# Patient Record
Sex: Female | Born: 1955 | Race: White | Hispanic: No | Marital: Married | Smoking: Never smoker
Health system: Southern US, Community
[De-identification: ages and names within clinical notes are randomized; demographics above are authoritative.]

## PROBLEM LIST (undated history)

## (undated) DIAGNOSIS — I10 Essential (primary) hypertension: Secondary | ICD-10-CM

## (undated) DIAGNOSIS — C569 Malignant neoplasm of unspecified ovary: Secondary | ICD-10-CM

## (undated) DIAGNOSIS — E876 Hypokalemia: Secondary | ICD-10-CM

## (undated) DIAGNOSIS — N8110 Cystocele, unspecified: Secondary | ICD-10-CM

## (undated) DIAGNOSIS — I509 Heart failure, unspecified: Secondary | ICD-10-CM

## (undated) DIAGNOSIS — E785 Hyperlipidemia, unspecified: Secondary | ICD-10-CM

## (undated) DIAGNOSIS — I639 Cerebral infarction, unspecified: Secondary | ICD-10-CM

## (undated) DIAGNOSIS — I059 Rheumatic mitral valve disease, unspecified: Secondary | ICD-10-CM

## (undated) DIAGNOSIS — K219 Gastro-esophageal reflux disease without esophagitis: Secondary | ICD-10-CM

## (undated) DIAGNOSIS — G4730 Sleep apnea, unspecified: Secondary | ICD-10-CM

## (undated) DIAGNOSIS — I2510 Atherosclerotic heart disease of native coronary artery without angina pectoris: Secondary | ICD-10-CM

## (undated) HISTORY — PX: CARPAL TUNNEL RELEASE: SHX101

## (undated) HISTORY — PX: CHOLECYSTECTOMY: SHX55

## (undated) HISTORY — PX: TONSILLECTOMY: SUR1361

## (undated) HISTORY — DX: Heart failure, unspecified: I50.9

## (undated) HISTORY — DX: Hypokalemia: E87.6

## (undated) HISTORY — PX: TENNIS ELBOW RELEASE/NIRSCHEL PROCEDURE: SHX6651

## (undated) HISTORY — PX: COLON SURGERY: SHX602

---

## 2004-05-19 ENCOUNTER — Emergency Department: Payer: Self-pay | Admitting: Emergency Medicine

## 2004-07-29 ENCOUNTER — Emergency Department: Payer: Self-pay | Admitting: Emergency Medicine

## 2004-10-20 ENCOUNTER — Emergency Department: Payer: Self-pay | Admitting: General Practice

## 2005-01-29 ENCOUNTER — Emergency Department: Payer: Self-pay | Admitting: Emergency Medicine

## 2005-02-26 ENCOUNTER — Emergency Department: Payer: Self-pay | Admitting: Emergency Medicine

## 2005-04-05 ENCOUNTER — Emergency Department: Payer: Self-pay | Admitting: Emergency Medicine

## 2005-08-04 ENCOUNTER — Emergency Department: Payer: Self-pay | Admitting: Emergency Medicine

## 2005-12-06 ENCOUNTER — Emergency Department: Payer: Self-pay | Admitting: Emergency Medicine

## 2005-12-23 ENCOUNTER — Emergency Department: Payer: Self-pay | Admitting: Unknown Physician Specialty

## 2006-06-13 ENCOUNTER — Emergency Department: Payer: Self-pay | Admitting: Emergency Medicine

## 2006-12-13 ENCOUNTER — Emergency Department: Payer: Self-pay | Admitting: Emergency Medicine

## 2007-01-02 ENCOUNTER — Emergency Department: Payer: Self-pay | Admitting: Unknown Physician Specialty

## 2007-03-06 ENCOUNTER — Ambulatory Visit: Payer: Self-pay

## 2007-06-02 ENCOUNTER — Emergency Department: Payer: Self-pay | Admitting: Emergency Medicine

## 2007-08-29 ENCOUNTER — Other Ambulatory Visit: Payer: Self-pay

## 2007-08-30 ENCOUNTER — Inpatient Hospital Stay: Payer: Self-pay | Admitting: Internal Medicine

## 2008-01-20 ENCOUNTER — Emergency Department: Payer: Self-pay | Admitting: Emergency Medicine

## 2008-02-04 ENCOUNTER — Emergency Department: Payer: Self-pay | Admitting: Emergency Medicine

## 2008-02-29 ENCOUNTER — Emergency Department: Payer: Self-pay | Admitting: Emergency Medicine

## 2008-06-16 ENCOUNTER — Emergency Department: Payer: Self-pay | Admitting: Emergency Medicine

## 2008-10-05 ENCOUNTER — Emergency Department: Payer: Self-pay | Admitting: Unknown Physician Specialty

## 2009-03-18 ENCOUNTER — Emergency Department: Payer: Self-pay | Admitting: Emergency Medicine

## 2009-04-02 ENCOUNTER — Emergency Department: Payer: Self-pay | Admitting: Emergency Medicine

## 2009-06-23 ENCOUNTER — Emergency Department: Payer: Self-pay | Admitting: Emergency Medicine

## 2009-08-02 ENCOUNTER — Emergency Department: Payer: Self-pay | Admitting: Emergency Medicine

## 2010-01-12 ENCOUNTER — Emergency Department: Payer: Self-pay | Admitting: Emergency Medicine

## 2010-02-03 ENCOUNTER — Emergency Department: Payer: Self-pay | Admitting: Internal Medicine

## 2010-03-10 ENCOUNTER — Emergency Department: Payer: Self-pay | Admitting: Emergency Medicine

## 2010-04-14 ENCOUNTER — Emergency Department: Payer: Self-pay | Admitting: Emergency Medicine

## 2010-06-27 ENCOUNTER — Emergency Department: Payer: Self-pay | Admitting: Emergency Medicine

## 2010-09-03 ENCOUNTER — Emergency Department: Payer: Self-pay | Admitting: Emergency Medicine

## 2011-06-17 ENCOUNTER — Emergency Department: Payer: Self-pay | Admitting: Internal Medicine

## 2011-06-24 ENCOUNTER — Emergency Department: Payer: Self-pay | Admitting: Internal Medicine

## 2011-07-16 ENCOUNTER — Emergency Department: Payer: Self-pay | Admitting: Emergency Medicine

## 2011-09-05 ENCOUNTER — Emergency Department: Payer: Self-pay | Admitting: Emergency Medicine

## 2011-09-05 LAB — COMPREHENSIVE METABOLIC PANEL
Anion Gap: 8 (ref 7–16)
Bilirubin,Total: 0.4 mg/dL (ref 0.2–1.0)
Chloride: 108 mmol/L — ABNORMAL HIGH (ref 98–107)
Co2: 27 mmol/L (ref 21–32)
EGFR (African American): >60
EGFR (Non-African Amer.): >60
Glucose: 89 mg/dL (ref 65–99)
Osmolality: 288 (ref 275–301)
Potassium: 3.9 mmol/L (ref 3.5–5.1)
SGPT (ALT): 24 U/L
Sodium: 143 mmol/L (ref 136–145)
Total Protein: 7.3 g/dL (ref 6.4–8.2)

## 2011-09-05 LAB — URINALYSIS, COMPLETE
Bilirubin,UR: NEGATIVE
Glucose,UR: NEGATIVE mg/dL (ref 0–75)
Ketone: NEGATIVE
Nitrite: NEGATIVE
Protein: NEGATIVE
RBC,UR: 19 /HPF (ref 0–5)
Specific Gravity: 1.016 (ref 1.003–1.030)
Squamous Epithelial: 4
WBC UR: 3 /HPF (ref 0–5)

## 2011-09-05 LAB — CBC
HCT: 34.4 % — ABNORMAL LOW (ref 35.0–47.0)
HGB: 11.7 g/dL — ABNORMAL LOW (ref 12.0–16.0)
MCH: 31.6 pg (ref 26.0–34.0)
MCHC: 33.9 g/dL (ref 32.0–36.0)
MCV: 93 fL (ref 80–100)
Platelet: 177 10*3/uL (ref 150–440)
RBC: 3.7 10*6/uL — ABNORMAL LOW (ref 3.80–5.20)
RDW: 14.2 % (ref 11.5–14.5)

## 2011-09-05 LAB — CK TOTAL AND CKMB (NOT AT ARMC)
CK, Total: 174 U/L (ref 21–215)
CK-MB: 1.9 ng/mL (ref 0.5–3.6)

## 2011-09-05 LAB — TROPONIN I: Troponin-I: <0.02 ng/mL

## 2011-09-09 ENCOUNTER — Observation Stay: Payer: Self-pay | Admitting: Internal Medicine

## 2011-09-09 LAB — CK TOTAL AND CKMB (NOT AT ARMC): CK-MB: 1.3 ng/mL (ref 0.5–3.6)

## 2011-09-09 LAB — BASIC METABOLIC PANEL
Anion Gap: 11 (ref 7–16)
BUN: 17 mg/dL (ref 7–18)
Chloride: 105 mmol/L (ref 98–107)
Co2: 25 mmol/L (ref 21–32)
EGFR (African American): >60
EGFR (Non-African Amer.): >60
Glucose: 107 mg/dL — ABNORMAL HIGH (ref 65–99)
Sodium: 141 mmol/L (ref 136–145)

## 2011-09-09 LAB — URINALYSIS, COMPLETE
Ketone: NEGATIVE
Nitrite: NEGATIVE
Protein: NEGATIVE
Squamous Epithelial: 1

## 2011-09-09 LAB — CBC
HGB: 12.8 g/dL (ref 12.0–16.0)
MCH: 31.2 pg (ref 26.0–34.0)
MCV: 94 fL (ref 80–100)
Platelet: 174 10*3/uL (ref 150–440)
RBC: 4.1 10*6/uL (ref 3.80–5.20)

## 2011-09-09 LAB — TROPONIN I: Troponin-I: <0.02 ng/mL

## 2011-09-10 LAB — HEPATIC FUNCTION PANEL A (ARMC)
Albumin: 3.5 g/dL (ref 3.4–5.0)
Alkaline Phosphatase: 80 U/L (ref 50–136)
Bilirubin, Direct: 0.1 mg/dL (ref 0.00–0.20)
SGOT(AST): 19 U/L (ref 15–37)
Total Protein: 6.8 g/dL (ref 6.4–8.2)

## 2011-09-10 LAB — CBC WITH DIFFERENTIAL/PLATELET
Basophil #: 0 10*3/uL (ref 0.0–0.1)
Eosinophil #: 0.1 10*3/uL (ref 0.0–0.7)
HCT: 34.1 % — ABNORMAL LOW (ref 35.0–47.0)
Lymphocyte #: 2 10*3/uL (ref 1.0–3.6)
Lymphocyte %: 34.7 %
MCHC: 33.4 g/dL (ref 32.0–36.0)
Monocyte %: 10.4 %
Neutrophil #: 2.9 10*3/uL (ref 1.4–6.5)
Platelet: 169 10*3/uL (ref 150–440)
RBC: 3.64 10*6/uL — ABNORMAL LOW (ref 3.80–5.20)
RDW: 14.5 % (ref 11.5–14.5)
WBC: 5.6 10*3/uL (ref 3.6–11.0)

## 2011-09-10 LAB — LIPID PANEL
Cholesterol: 140 mg/dL (ref 0–200)
HDL Cholesterol: 48 mg/dL (ref 40–60)
Ldl Cholesterol, Calc: 58 mg/dL (ref 0–100)
Triglycerides: 172 mg/dL (ref 0–200)
VLDL Cholesterol, Calc: 34 mg/dL (ref 5–40)

## 2011-09-10 LAB — BASIC METABOLIC PANEL
Calcium, Total: 8.7 mg/dL (ref 8.5–10.1)
EGFR (African American): 58 — ABNORMAL LOW
EGFR (Non-African Amer.): 48 — ABNORMAL LOW
Glucose: 86 mg/dL (ref 65–99)
Osmolality: 285 (ref 275–301)

## 2011-09-11 LAB — URINE CULTURE

## 2011-09-12 ENCOUNTER — Emergency Department: Payer: Self-pay | Admitting: *Deleted

## 2011-09-18 ENCOUNTER — Emergency Department: Payer: Self-pay | Admitting: *Deleted

## 2011-09-18 LAB — COMPREHENSIVE METABOLIC PANEL
Albumin: 3.7 g/dL (ref 3.4–5.0)
Alkaline Phosphatase: 82 U/L (ref 50–136)
Anion Gap: 10 (ref 7–16)
Calcium, Total: 9.1 mg/dL (ref 8.5–10.1)
Chloride: 103 mmol/L (ref 98–107)
Co2: 28 mmol/L (ref 21–32)
EGFR (African American): >60
EGFR (Non-African Amer.): >60
Glucose: 100 mg/dL — ABNORMAL HIGH (ref 65–99)
Osmolality: 285 (ref 275–301)
Potassium: 3.5 mmol/L (ref 3.5–5.1)
SGOT(AST): 24 U/L (ref 15–37)
Sodium: 141 mmol/L (ref 136–145)

## 2011-09-18 LAB — TROPONIN I: Troponin-I: <0.02 ng/mL

## 2011-09-18 LAB — CBC
MCH: 31.6 pg (ref 26.0–34.0)
MCV: 93 fL (ref 80–100)
Platelet: 204 10*3/uL (ref 150–440)
RBC: 3.82 10*6/uL (ref 3.80–5.20)
WBC: 7.7 10*3/uL (ref 3.6–11.0)

## 2011-09-30 ENCOUNTER — Emergency Department: Payer: Self-pay | Admitting: Emergency Medicine

## 2012-01-16 ENCOUNTER — Emergency Department: Payer: Self-pay | Admitting: Unknown Physician Specialty

## 2012-01-22 ENCOUNTER — Emergency Department: Payer: Self-pay | Admitting: Emergency Medicine

## 2012-01-25 ENCOUNTER — Ambulatory Visit: Payer: Self-pay | Admitting: Specialist

## 2012-04-01 ENCOUNTER — Emergency Department: Payer: Self-pay | Admitting: Emergency Medicine

## 2012-05-04 ENCOUNTER — Emergency Department: Payer: Self-pay | Admitting: Emergency Medicine

## 2012-05-04 LAB — CBC WITH DIFFERENTIAL/PLATELET
Basophil %: 0.4 %
Eosinophil #: 0.1 10*3/uL (ref 0.0–0.7)
Eosinophil %: 1 %
HGB: 11.8 g/dL — ABNORMAL LOW (ref 12.0–16.0)
Lymphocyte %: 28.3 %
MCH: 32 pg (ref 26.0–34.0)
MCHC: 34.3 g/dL (ref 32.0–36.0)
MCV: 93 fL (ref 80–100)
Monocyte #: 0.6 x10 3/mm (ref 0.2–0.9)
Neutrophil %: 61.8 %
Platelet: 182 10*3/uL (ref 150–440)
RBC: 3.69 10*6/uL — ABNORMAL LOW (ref 3.80–5.20)

## 2012-05-04 LAB — COMPREHENSIVE METABOLIC PANEL
Albumin: 3.8 g/dL (ref 3.4–5.0)
Alkaline Phosphatase: 104 U/L (ref 50–136)
BUN: 16 mg/dL (ref 7–18)
Bilirubin,Total: 0.4 mg/dL (ref 0.2–1.0)
Co2: 27 mmol/L (ref 21–32)
Creatinine: 0.76 mg/dL (ref 0.60–1.30)
Glucose: 85 mg/dL (ref 65–99)
Osmolality: 284 (ref 275–301)
SGPT (ALT): 27 U/L (ref 12–78)
Total Protein: 7 g/dL (ref 6.4–8.2)

## 2012-05-04 LAB — SEDIMENTATION RATE: Erythrocyte Sed Rate: 20 mm/hr (ref 0–30)

## 2012-05-23 ENCOUNTER — Emergency Department: Payer: Self-pay | Admitting: Emergency Medicine

## 2012-06-27 ENCOUNTER — Emergency Department: Payer: Self-pay | Admitting: Emergency Medicine

## 2012-12-07 ENCOUNTER — Emergency Department: Payer: Self-pay | Admitting: Emergency Medicine

## 2013-01-06 ENCOUNTER — Emergency Department: Payer: Self-pay | Admitting: Emergency Medicine

## 2013-01-06 LAB — CBC
HGB: 12.9 g/dL (ref 12.0–16.0)
MCHC: 34.2 g/dL (ref 32.0–36.0)
MCV: 90 fL (ref 80–100)
RDW: 14.1 % (ref 11.5–14.5)

## 2013-01-06 LAB — BASIC METABOLIC PANEL
Anion Gap: 10 (ref 7–16)
BUN: 16 mg/dL (ref 7–18)
Co2: 25 mmol/L (ref 21–32)
EGFR (African American): >60
Osmolality: 282 (ref 275–301)
Potassium: 3.6 mmol/L (ref 3.5–5.1)
Sodium: 141 mmol/L (ref 136–145)

## 2013-01-06 LAB — TROPONIN I: Troponin-I: <0.02 ng/mL

## 2013-03-26 ENCOUNTER — Emergency Department: Payer: Self-pay | Admitting: Emergency Medicine

## 2013-03-26 LAB — CBC
HCT: 37.6 % (ref 35.0–47.0)
HGB: 12.8 g/dL (ref 12.0–16.0)
MCH: 31 pg (ref 26.0–34.0)
MCHC: 33.9 g/dL (ref 32.0–36.0)
Platelet: 201 10*3/uL (ref 150–440)
RBC: 4.12 10*6/uL (ref 3.80–5.20)

## 2013-03-26 LAB — BASIC METABOLIC PANEL
Anion Gap: 6 — ABNORMAL LOW (ref 7–16)
BUN: 24 mg/dL — ABNORMAL HIGH (ref 7–18)
Calcium, Total: 9.7 mg/dL (ref 8.5–10.1)
Co2: 27 mmol/L (ref 21–32)
Creatinine: 0.91 mg/dL (ref 0.60–1.30)
EGFR (African American): >60
EGFR (Non-African Amer.): >60
Glucose: 94 mg/dL (ref 65–99)

## 2013-03-26 LAB — CK TOTAL AND CKMB (NOT AT ARMC)
CK, Total: 113 U/L (ref 21–215)
CK, Total: 103 U/L (ref 21–215)
CK-MB: 1 ng/mL (ref 0.5–3.6)

## 2013-03-26 LAB — TROPONIN I: Troponin-I: <0.02 ng/mL

## 2013-07-03 ENCOUNTER — Emergency Department: Payer: Self-pay

## 2013-07-06 LAB — BETA STREP CULTURE(ARMC)

## 2014-05-13 ENCOUNTER — Emergency Department: Payer: Self-pay | Admitting: Emergency Medicine

## 2014-06-01 ENCOUNTER — Emergency Department: Payer: Self-pay | Admitting: Emergency Medicine

## 2014-06-02 ENCOUNTER — Emergency Department: Payer: Self-pay | Admitting: Emergency Medicine

## 2014-06-02 LAB — CBC WITH DIFFERENTIAL/PLATELET
BASOS ABS: 0 10*3/uL (ref 0.0–0.1)
BASOS PCT: 0.5 %
Eosinophil #: 0.1 10*3/uL (ref 0.0–0.7)
Eosinophil %: 1.4 %
HCT: 36.9 % (ref 35.0–47.0)
HGB: 12.6 g/dL (ref 12.0–16.0)
LYMPHS ABS: 1.7 10*3/uL (ref 1.0–3.6)
Lymphocyte %: 23.1 %
MCH: 31.8 pg (ref 26.0–34.0)
MCHC: 34.1 g/dL (ref 32.0–36.0)
MCV: 93 fL (ref 80–100)
MONO ABS: 0.7 x10 3/mm (ref 0.2–0.9)
Monocyte %: 9 %
Neutrophil #: 4.9 10*3/uL (ref 1.4–6.5)
Neutrophil %: 66 %
PLATELETS: 214 10*3/uL (ref 150–440)
RBC: 3.95 10*6/uL (ref 3.80–5.20)
RDW: 13.9 % (ref 11.5–14.5)
WBC: 7.5 10*3/uL (ref 3.6–11.0)

## 2014-06-02 LAB — BASIC METABOLIC PANEL
Anion Gap: 7 (ref 7–16)
BUN: 18 mg/dL (ref 7–18)
CALCIUM: 8.5 mg/dL (ref 8.5–10.1)
Chloride: 105 mmol/L (ref 98–107)
Co2: 28 mmol/L (ref 21–32)
Creatinine: 0.98 mg/dL (ref 0.60–1.30)
Glucose: 115 mg/dL — ABNORMAL HIGH (ref 65–99)
Osmolality: 282 (ref 275–301)
Potassium: 3.2 mmol/L — ABNORMAL LOW (ref 3.5–5.1)
Sodium: 140 mmol/L (ref 136–145)

## 2014-06-02 LAB — TROPONIN I: Troponin-I: <0.02 ng/mL

## 2014-06-07 ENCOUNTER — Emergency Department: Payer: Self-pay | Admitting: Internal Medicine

## 2014-06-07 LAB — COMPREHENSIVE METABOLIC PANEL
ALT: 28 U/L
ANION GAP: 6 — AB (ref 7–16)
Albumin: 3.7 g/dL (ref 3.4–5.0)
Alkaline Phosphatase: 137 U/L — ABNORMAL HIGH
BILIRUBIN TOTAL: 0.5 mg/dL (ref 0.2–1.0)
BUN: 19 mg/dL — AB (ref 7–18)
CO2: 31 mmol/L (ref 21–32)
Calcium, Total: 9 mg/dL (ref 8.5–10.1)
Chloride: 100 mmol/L (ref 98–107)
Creatinine: 0.92 mg/dL (ref 0.60–1.30)
EGFR (African American): >60
EGFR (Non-African Amer.): >60
GLUCOSE: 109 mg/dL — AB (ref 65–99)
Osmolality: 277 (ref 275–301)
POTASSIUM: 3.4 mmol/L — AB (ref 3.5–5.1)
SGOT(AST): 15 U/L (ref 15–37)
Sodium: 137 mmol/L (ref 136–145)
Total Protein: 7.5 g/dL (ref 6.4–8.2)

## 2014-06-07 LAB — CBC WITH DIFFERENTIAL/PLATELET
Basophil #: 0 10*3/uL (ref 0.0–0.1)
Basophil %: 0.5 %
EOS PCT: 1.3 %
Eosinophil #: 0.1 10*3/uL (ref 0.0–0.7)
HCT: 38 % (ref 35.0–47.0)
HGB: 12.6 g/dL (ref 12.0–16.0)
LYMPHS ABS: 1.8 10*3/uL (ref 1.0–3.6)
LYMPHS PCT: 23.4 %
MCH: 31 pg (ref 26.0–34.0)
MCHC: 33.2 g/dL (ref 32.0–36.0)
MCV: 93 fL (ref 80–100)
MONOS PCT: 9.4 %
Monocyte #: 0.7 x10 3/mm (ref 0.2–0.9)
Neutrophil #: 4.9 10*3/uL (ref 1.4–6.5)
Neutrophil %: 65.4 %
Platelet: 215 10*3/uL (ref 150–440)
RBC: 4.07 10*6/uL (ref 3.80–5.20)
RDW: 14.1 % (ref 11.5–14.5)
WBC: 7.5 10*3/uL (ref 3.6–11.0)

## 2014-06-07 LAB — CK TOTAL AND CKMB (NOT AT ARMC)
CK, Total: 93 U/L (ref 26–192)
CK-MB: 0.7 ng/mL (ref 0.5–3.6)

## 2014-06-07 LAB — URINALYSIS, COMPLETE
Bilirubin,UR: NEGATIVE
Glucose,UR: NEGATIVE mg/dL (ref 0–75)
KETONE: NEGATIVE
Nitrite: NEGATIVE
Ph: 5 (ref 4.5–8.0)
Specific Gravity: 1.017 (ref 1.003–1.030)
Squamous Epithelial: 15

## 2014-06-07 LAB — MAGNESIUM: Magnesium: 2.2 mg/dL

## 2014-06-07 LAB — TSH: Thyroid Stimulating Horm: 0.702 u[IU]/mL

## 2014-06-07 LAB — TROPONIN I

## 2014-07-19 ENCOUNTER — Emergency Department: Payer: Self-pay | Admitting: Student

## 2014-11-09 NOTE — Consult Note (Signed)
Brief Consult Note: Diagnosis: Known history of GERD.  Presentation to ER with mid chest pain with the susp. of pain being related to uncontrolled GERD or esophageal spasm.  Seen 09/05/2011 in ER and diagnosed with possible TIA discharged home.  MR of Brain reveals acute findings.   Orders entered.   Discussed with Attending MD.   Comments: Patient's presentation discussed with Dr. Verdie Shire.  Recommendation is to monitor patient's symptoms at this time due to the fact she has just experienced acute vascular event.  Do recommend PPI therapy bid and for her to be discharged on Nexium 40 mg one capsule bid.  She should avoid NSAIDs. She will require office follow up with our group to discuss proceeding when clinically feasible with EGD and colonoscopy for screening.  Discussed with patient. Will continue to monitor.  Electronic Signatures: Payton Emerald (NP)  (Signed [phone] 15:35)  Authored: Brief Consult Note   Last Updated: 22-Feb-13 15:35 by Payton Emerald (NP)

## 2014-11-09 NOTE — Discharge Summary (Signed)
PATIENT NAME:  Destiny [name], STEPIEN MR#:  [id number] DATE OF BIRTH:  [birth year]  DATE OF ADMISSION:  09/09/2011 DATE OF DISCHARGE:  09/10/2011  ADMITTING PHYSICIAN: Loletha Grayer, MD  DISCHARGING PHYSICIAN: Gladstone Lighter, MD  PRIMARY CARE PHYSICIAN: Destiny Lank, MD  CONSULTANTS: Verdie Shire, MD - Gastroenterology.  DISCHARGE DIAGNOSES:  1. Chest pain secondary to gastroesophageal reflux disease.  2. Cerebrovascular accident with acute left parietal infarct and small punctate left cerebellar infarct with no residual neurological deficits.  3. Hypertension.  4. Hyperlipidemia.  5. Sleep apnea.   DISCHARGE HOME MEDICATIONS:  1. Vitamin D3 2000 international units 1 tablet p.o. daily.  2. Lovastatin 40 mg p.o. daily.  3. Gabapentin 600 mg at bedtime.  4. Nexium 40 mg p.o. twice a day. 5. Plavix 70 mg p.o. daily.   NOTE: The patient was advised to hold her Lasix and potassium chloride for the next three days.   DISCHARGE DIET: Low-sodium diet.   DISCHARGE ACTIVITY: As tolerated.   FOLLOWUP INSTRUCTIONS:  1. Follow-up with PCP in one week.  2. Gastroenterology followup in 3 to 4 weeks.  DISCHARGE LABS/STUDIES: WBC 5.6, hemoglobin 11.4, hematocrit 34.1, platelet count 169.   Sodium 142, potassium 4.0, chloride 105, bicarbonate 28, BUN 20, creatinine 1.24, glucose 86, calcium 8.7.   LDL 58, HDL 48, triglycerides 172, total cholesterol 140. ALT 22, AST 19, alkaline phosphatase 80, total bilirubin 0.4, albumin 3.5. Troponins have remained negative.   Urinalysis and urine cultures have remained negative while in the hospital.   Ultrasound carotid Doppler's bilaterally are showing no hemodynamically significant stenosis on both sides.  A MRI of the brain does show a small acute infarct in the left parietal lobe and possible punctate infarct in the left cerebellum, which is acute.  Echo Doppler is showing left atrial enlargement, borderline, ejection fraction greater than 55%, and  mild tricuspid regurgitation.  BRIEF HOSPITAL COURSE: Destiny [name] is a 59 year old female with past medical history significant for gastroesophageal reflux disease, hypertension, and hyperlipidemia who presented to the hospital complaining of sudden onset of chest pain and heartburn that woke her up from sleep.  1. Chest pain, likely secondary to worsening gastroesophageal reflux disease. Gastroenterology was contacted. The patient was placed on Protonix IV and questionable plan for endoscopy at the time of admission. Since her symptoms have improved on IV Protonix and she has not had any nausea, vomiting, hematemesis, or melena, and no evidence of any active GI bleed, Gastroenterology recommended changing her Nexium from once a day to twice a day and will follow up as an outpatient for endoscopy and colonoscopy as an outpatient. Her symptoms have improved and she has tolerated a regular diet.  2. Acute cerebrovascular accident. The patient had symptoms of slurred speech five days prior to this admission and came to the emergency room. She had a CT of the head which was negative and was discharged. Currently she does not have any residual neurological deficits, but because of her presentation a MRI was ordered and it did show an acute infarct in the left parietal area and a punctate infarct in the left cerebellar region. She has worked with physical therapy and has not shown any deficits. She was not taking any aspirin at home. She got some headache with Aggrenox so we will change her to Plavix at the time of discharge and can follow-up with her PCP. Her Doppler's and echocardiogram are normal at this time.  3. Hypertension. Blood pressure is stable.  4. Hyperlipidemia with  LDL of only 58. She is on lovastatin.  5. She was on Lasix prior to admission. Her ejection fraction is 55%. She has minor pedal edema, but since her creatinine has slightly increased, although with normal range along with BUN, she was  advised to hold off on taking her Lasix for the next three days and restart it and see her PCP in the next week.   Her course has been otherwise uneventful in the hospital.   DISCHARGE CONDITION: Stable.   DISCHARGE DISPOSITION: Home.   TIME SPENT ON DISCHARGE: 45 minutes. ____________________________ Gladstone Lighter, MD rk:slb D: 09/10/2011 13:07:01 ET T: 09/11/2011 13:53:43 ET JOB#: [id number]  cc: Gladstone Lighter, MD, <Dictator> Sarah "Sallie" Posey Pronto, MD Gladstone Lighter MD ELECTRONICALLY SIGNED 09/16/2011 10:43

## 2014-11-09 NOTE — Consult Note (Signed)
Chief Complaint:   Subjective/Chief Complaint See Dawn Harrison's notes from yest. No chest pain or burning this AM. Chronic hx of GERD on Nexium for years.   VITAL SIGNS/ANCILLARY NOTES: **Vital Signs.:   23-Feb-13 05:38   Temperature Temperature (F) 98.9   Celsius 37.1   Pulse Pulse 69   Respirations Respirations 16   Systolic BP Systolic BP 768   Diastolic BP (mmHg) Diastolic BP (mmHg) 71   Mean BP 83   Pulse Ox % Pulse Ox % 96   Oxygen Delivery Room Air/ 21 %   Brief Assessment:   Cardiac Regular    Respiratory normal resp effort  clear BS    Gastrointestinal Normal   Routine Hem:  23-Feb-13 06:17    WBC (CBC) 5.6   RBC (CBC) 3.64   Hemoglobin (CBC) 11.4   Hematocrit (CBC) 34.1   Platelet Count (CBC) 169   MCV 94   MCH 31.3   MCHC 33.4   RDW 14.5  Routine Chem:  23-Feb-13 06:17    Glucose, Serum 86   BUN 20   Creatinine (comp) 1.24   Sodium, Serum 142   Potassium, Serum 4.0   Chloride, Serum 105   CO2, Serum 28   Calcium (Total), Serum 8.7   Anion Gap 9   Osmolality (calc) 285   eGFR (African American) 58   eGFR (Non-African American) 48  Routine Hem:  23-Feb-13 06:17    Neutrophil % 52.3   Lymphocyte % 34.7   Monocyte % 10.4   Eosinophil % 2.4   Basophil % 0.2   Neutrophil # 2.9   Lymphocyte # 2.0   Monocyte # 0.6   Eosinophil # 0.1   Basophil # 0.0  Routine Chem:  23-Feb-13 06:17    Cholesterol, Serum 140   Triglycerides, Serum 172   HDL (INHOUSE) 48   VLDL Cholesterol Calculated 34   LDL Cholesterol Calculated 58  Hepatic:  23-Feb-13 06:17    Bilirubin, Total 0.4   Bilirubin, Direct 0.1   Alkaline Phosphatase 80   SGPT (ALT) 22   SGOT (AST) 19   Total Protein, Serum 6.8   Albumin, Serum 3.5   Assessment/Plan:  Assessment/Plan:   Assessment Prob GERD. CVA.    Plan Continue PPI BID QAC. Have patient f/u with [place] after discharge. Will arrange for EGD and colonoscopy later. Will sign off. Thanks.   Electronic Signatures: Verdie Shire (MD)  (Signed [phone] 08:30)  Authored: Chief Complaint, VITAL SIGNS/ANCILLARY NOTES, Brief Assessment, Lab Results, Assessment/Plan   Last Updated: 23-Feb-13 08:30 by Verdie Shire (MD)

## 2014-11-09 NOTE — Consult Note (Signed)
PATIENT NAME:  Destiny [name], Destiny [name] MR#:  [id number] DATE OF BIRTH:  [birth year]  DATE OF CONSULTATION:  09/09/2011  REFERRING PHYSICIAN:  Loletha Grayer, MD CONSULTING PHYSICIAN:  Payton Emerald, NP/Paul Oh, MD  REASON FOR CONSULTATION: Chest pain. Known history of gastroesophageal reflux disease.   HISTORY OF PRESENT ILLNESS:  Destiny [name] is a 59 year old Caucasian female who presented to [facility] emergency room for the concern of chest pain. She awoke from sleep at 1:00 a.m. today, burning sensation, very severe, according to patient described as a 9 on a scale of 1 to 10 in intensity. Discomfort lasted for 5 minutes in length. She did feel some pain radiating to her right arm. She was able to fall back to sleep and then awoke at 3 a.m. with the symptoms being similar, at that time her husband decided to bring her to the emergency room.   She thought possibly her symptoms were in correlation with acid reflux as she had eaten Manwich the night before. She has some nausea but no vomiting. No shortness of breath.  Did not become diaphoretic. She was given nitroglycerin, aspirin, as well as a gastrointestinal cocktail in the emergency room. They were not given at the same time but at separate times.  The gastrointestinal cocktail is what helped to relieve the chest discomfort. She had presented to the emergency room Monday of this week for slurred speech. She was unable to get her words together. She was told that she had a transient ischemic attack. CT of the head was negative, but she has undergone an MRI today, which was done without contrast, which did reveal a small acute infarct in the left parietal lobe, possible punctate acute infarct in the left cerebellum.   She had presented to her primary care provider on Wednesday. She was given Bactrim for a possible urinary tract infection. She did experience diarrhea this morning after the onset of these symptoms. Normally she experiences constipation. No melena.  No evidence of bright red blood. She had an upper endoscopy done numerous years ago. No history of ulcer disease. Denies ever having a colonoscopy performed in the past.   PAST MEDICAL HISTORY: Hyperlipidemia, transient ischemic attack 2009, hypertension, edema, sleep apnea, and gastroesophageal reflux disease.   PAST SURGICAL HISTORY: Cholecystectomy, cyst resection from rectum, tonsillectomy, carpal tunnel surgery bilaterally, and left tennis elbow.   ALLERGIES: None.    MEDICATIONS:   1. Bactrim recently prescribed unknown milligrams or directions.   2. Lasix 40 mg a day.   3. Nexium 40 mg daily, which she does take 30 minutes to an hour prior to breakfast.   4. K-Dur 10 mEq a day.   5. Gabapentin 600 mg 2-1/2 tablets at night.   6. Lovastatin 40 mg a day.   7. Aspirin 81 mg a day.   8. Vitamin D 4000 international units daily.   FAMILY HISTORY: Mother, possible heart disease. Cancer, unknown specific details. Father deceased from myocardial infarction.   SOCIAL HISTORY: No tobacco. No alcohol. No recreational drug use. Resides with her husband. Employed as a Quarry manager.   REVIEW OF SYSTEMS: CONSTITUTIONAL: Significant for chills. No fevers. No sweats.  Positive for weight loss from 245 down to 200 unintentionally.  Fatigued. ENT: Does wear glasses. HEENT: No difficulty swallowing. Significant for runny nose, dry throat. CARDIOVASCULAR: Significant for chest burning midsternal. No heart palpitations. RESPIRATORY: No cough, no shortness of breath. No, sputum production. No hemoptysis.  GI: See HPI. GU: No dysuria, hematuria. MUSCULOSKELETAL: Significant  for right arm weakness, pain, and common for cramping to her legs. INTEGUMENTARY: No rashes. No lesions. NEUROLOGICAL: Newly diagnosed with transient ischemic attack this admission, and history of transient ischemic attack in 2009. ENDOCRINE: No thyroid problems. HEMATOLOGIC/LYMPHATIC: No anemia. Denies significant easy bruising and bleeding.    PHYSICAL EXAMINATION:  VITAL SIGNS: Temperature 98.1, pulse of 67, respirations are 16, blood pressure 119/81, pulse oximetry is 97%.   GENERAL: Well-developed, well-nourished 59 year old Caucasian female, no acute distress noted. Pleasant.   HEENT: Normocephalic, atraumatic. Pupils equal, reactive to light. Conjunctivae clear. Sclerae anicteric.   NECK: Supple. Trachea midline. No lymphadenopathy or thyromegaly.   PULMONARY: Symmetric rise and fall of chest. Clear to auscultation throughout.   CARDIOVASCULAR: Regular rhythm, S1, S2. No murmurs, no gallops.   ABDOMEN: Soft, nondistended. Bowel sounds in 4 quadrants. No bruits. No masses.   RECTAL: Deferred.   MUSCULOSKELETAL: Moving all four extremities. No contractures. No clubbing. No weakness.   EXTREMITIES: No edema.   NEUROLOGICAL: No gross neurological deficits.   PSYCHIATRIC: Alert and oriented x4. Memory grossly intact. Appropriate affect and mood.    LABORATORY/DIAGNOSTIC: Reviewed. Chemistry panel from September 05, 2011, BUN was elevated at 22, chloride was elevated at 108, otherwise within normal limits. Hemoglobin was 11.7 with a hematocrit of 34.4, otherwise within normal limits. Urinalysis revealed +2 blood, +2 leukocytes, RBCs are 18 per high-power field, WBCs is 3 per high-power field, and epithelial cells are 4 per high-power field. Comparison today's date glucose is 107, otherwise within normal limits. Cardiac enzymes 1st in series CK is 141, CK-MB is 1.3, and troponin is less than 0.02. Troponin 2nd in series less than 0.02 and CBC within normal limits. CT of head without contrast was done on the 09/05/2011 which revealed chronic changes without evidence of acute abnormalities. Chest x-ray revealed no evidence of acute cardiopulmonary disease. Ultrasound Doppler lower right extremity unremarkable. MRI performed today results as stated under history. Chest x-ray no acute changes. An ultrasound carotid Doppler  bilaterally, no hemodynamically significant stenosis is identified on either side. There is intergrade flow to both vertebrals.   IMPRESSION:  Known history of gastroesophageal reflux disease. Presentation in ER with mid chest pain suspicion for pain being related to uncontrolled gastroesophageal reflux disease versus esophageal spasm. The patient was seen in the ER on 09/05/2011 and diagnosed with probable transient ischemic attack, discharged with aspirin. MRI of brain today reveals acute changes.   PLAN: The patient's presentation was discussed with Dr. Verdie Shire. Recommendation is to monitor the patient's symptoms at this time due to the fact that she just experienced acute vascular event.  Do recommend PPI therapy b.i.d. and for her to be discharged on Nexium 40 mg capsule twice a day. She should avoid NSAIDs. She will require office followup with our group to discuss proceeding when clinically feasible with an EGD and a colonoscopy for screening. Discussed with patient. We will continue to monitor.    These services provided under collaborative agreement with Dr. Verdie Shire.   Thank you for allowing [place] to participate in the care during her hospitalization.      ____________________________ Payton Emerald, NP dsh:vtd D: 09/09/2011 15:35:35 ET T: 09/10/2011 09:07:52 ET JOB#: [id number]  cc: Payton Emerald, NP, <Dictator> Payton Emerald MD ELECTRONICALLY SIGNED 09/14/2011 13:38

## 2014-11-09 NOTE — H&P (Signed)
PATIENT NAME:  Destiny [name], Destiny [name] MR#:  [id number] DATE OF BIRTH:  [birth year]  DATE OF ADMISSION:  09/09/2011  PRIMARY CARE PHYSICIAN: Dr. Posey Pronto at [facility]: Chest pain.   HISTORY OF PRESENT ILLNESS: This is a 59 year old female who presents to the ER with chest pain which woke her up from sleep at 1 a.m. She describes the chest pain as burning in nature, very severe, 9 out of 10 in intensity, then it lasted for about five minutes. She did feel some arm pain afterwards on the right arm. She went back to sleep and then again at 2:30 to 3 a.m. woke up again with this burning chest pain lasting again for another five minutes then it radiated to her back between her shoulder blades bilaterally. She thought it was acid reflux because she ate a Manwich last night for supper. She did have some nausea this morning but not with the episodes. No shortness of breath. No diaphoresis. Nothing made the pain better or worse. Right now the pain has settled down on its own. She did receive a GI cocktail and nitroglycerin in the Emergency Room and also an aspirin. Of note, she was here in the Emergency Room on Monday with slurred speech. She could not get her words together. They told her it was a TIA. She had a CT scan of the head that was negative at the time but, of note, she did have a positive urinalysis at that time. She went to her primary care physician on Wednesday and was given Bactrim for possible urinary tract infection and was supposed to be set up for an MRI, ultrasound, and echocardiogram. She did have diarrhea this morning also. Hospitalist services were contacted for admission.   PAST MEDICAL HISTORY:  1. Hyperlipidemia. 2. Transient ischemic attack. 3. Hypertension. 4. Edema. 5. Sleep apnea.  6. Gastroesophageal reflux disease.   PAST SURGICAL HISTORY:  1. Cholecystectomy.  2. Cyst resection.  3. Tonsillectomy.  4. Carpal tunnel bilaterally. 5. Tennis elbow on the  left.   ALLERGIES: No known drug allergies.   MEDICATIONS:  1. Recently prescribed Bactrim.  2. Lasix 40 mg daily.  3. Nexium 40 mg daily.  4. K-Dur 10 mEq daily.  5. Gabapentin 600 mg 2-1/2 tablets at night.  6. Lovastatin 40 mg. 7. Aspirin 81 mg.   SOCIAL HISTORY: No smoking. No alcohol. No drug use. Lives with her husband. Works as a Quarry manager.   FAMILY HISTORY: Mother died of possibly heart disease. Mother unknown medical history.   REVIEW OF SYSTEMS: CONSTITUTIONAL: Positive for chills. No fever. No sweats. Positive for weight loss from 245 down to 200, not trying. Positive for fatigue. EYES: She does wear glasses. EARS, NOSE, MOUTH, AND THROAT: No difficulty swallowing. Positive for runny nose, dry throat. CARDIOVASCULAR: Positive for chest burning. No palpitations. RESPIRATORY: No shortness of breath. No cough. No sputum. No hemoptysis. GASTROINTESTINAL: Positive for nausea. No vomiting. No abdominal pain. Positive for diarrhea this morning. No constipation. No bright red blood per rectum. No melena. GENITOURINARY: No burning on urination. No hematuria. MUSCULOSKELETAL: Positive for right arm pain. Positive for common cramps in the legs. INTEGUMENTARY: No rashes or eruptions. NEUROLOGIC: No fainting or blackouts. ENDOCRINE: No thyroid problems. HEMATOLOGIC/LYMPHATIC: No anemia. No easy bruising or bleeding.   PHYSICAL EXAMINATION:   VITAL SIGNS: Temperature 95.7, pulse 75, respirations 16, blood pressure 137/83, pulse oximetry 98% on room air.   GENERAL: No respiratory distress.   EYES: Conjunctivae and  lids normal. Pupils equal, round, and reactive to light. Extraocular muscles intact. No nystagmus.   EARS, NOSE, MOUTH, AND THROAT: Tympanic membrane no erythema. Nasal mucosa no erythema. Throat no erythema. No exudate seen. Lips and gums normal.   NECK: No JVD. No bruits. No lymphadenopathy. No thyromegaly. No thyroid nodules palpated.   RESPIRATORY: Lungs clear to auscultation. No  use of accessory muscles to breathe. No rhonchi, rales, or wheeze heard.   CARDIOVASCULAR: S1, S2 normal. No gallops, rubs, or murmurs heard. Carotid upstroke 2+ bilaterally. No bruits.   EXTREMITIES: Dorsalis pedis pulses 2+ bilaterally. No edema of the lower extremity.   ABDOMEN: Soft, nontender. No organomegaly/splenomegaly. Normoactive bowel sounds. No masses felt.   LYMPHATIC: No lymph nodes in the neck.   MUSCULOSKELETAL: No clubbing, edema, or cyanosis. Positive pain in the biceps groove on the right shoulder and at the biceps insertion in the right elbow.  SKIN: No rashes or lesions.   NEUROLOGIC: Cranial nerves II through XII grossly intact. Deep tendon reflexes 2+ bilateral lower extremities.   PSYCHIATRIC: The patient is oriented to person, place, and time.   LABORATORY, DIAGNOSTIC, AND RADIOLOGICAL DATA: White blood cell count 7.3, hemoglobin and hematocrit 12.8 and 38.3, platelet count 174, glucose 107, BUN 17, creatinine 0.88, sodium 141, potassium 3.9, chloride 105, CO2 25, calcium 9.3. Cardiac enzymes negative. Ultrasound of the right upper extremity last ER visit was no DVT right arm. Urinalysis on February 18th showed 2+ leukocyte esterase. Chest x-ray on the 18th showed no acute cardiopulmonary disease. CT scan of the head on the 18th showed chronic changes.   ASSESSMENT AND PLAN:  1. Chest pain with burning sensation. This sounds more GI related to me. Will admit as an observation. Get telemetry and serial cardiac enzymes. I did speak with Dr. Verdie Shire of Gastroenterology to consider endoscopy. I will hold aspirin at this time and give IV Protonix. Will give a diet today. If endoscopy is going to be done it will be tomorrow after MRI of the brain results back.  2. Possible recent TIA with slurred speech the other day requiring an ER visit. Will get an MRI of the brain to rule out stroke and a carotid ultrasound. I doubt this is a TIA. It may be related to the urinary tract  infection.  3. Recent urinary tract infection. Will send off a urinalysis and urine culture. Continue Bactrim at this point.  4. Right arm biceps pain, most likely tendinitis. Hold off on inflammatories at this point with burning in the chest.  5. Hyperlipidemia. Continue lovastatin.  6. Sleep apnea. She wears CPAP at night and she takes gabapentin for this, not quite sure what the reason behind that is.   TIME SPENT ON ADMISSION: 55 minutes. The patient will be admitted as an observation.   ____________________________ Tana Conch. Leslye Peer, MD rjw:drc D: 09/09/2011 09:40:24 ET T: 09/09/2011 10:01:15 ET JOB#: [id number]  cc: Tana Conch. Leslye Peer, MD, <Dictator> Sarah "Sallie" Posey Pronto, MD Marisue Brooklyn MD ELECTRONICALLY SIGNED 09/09/2011 16:05

## 2014-11-15 ENCOUNTER — Emergency Department: Admit: 2014-11-15 | Disposition: A | Discharge status: Home / Self Care | Payer: Self-pay | Admitting: Student

## 2014-11-27 ENCOUNTER — Emergency Department
Admission: EM | Admit: 2014-11-27 | Discharge: 2014-11-27 | Disposition: A | Discharge status: Home / Self Care | Payer: Self-pay | Attending: Emergency Medicine | Admitting: Emergency Medicine

## 2014-11-27 ENCOUNTER — Encounter: Payer: Self-pay | Admitting: Emergency Medicine

## 2014-11-27 DIAGNOSIS — Z7902 Long term (current) use of antithrombotics/antiplatelets: Secondary | ICD-10-CM | POA: Insufficient documentation

## 2014-11-27 DIAGNOSIS — Z79899 Other long term (current) drug therapy: Secondary | ICD-10-CM | POA: Insufficient documentation

## 2014-11-27 DIAGNOSIS — L259 Unspecified contact dermatitis, unspecified cause: Secondary | ICD-10-CM | POA: Insufficient documentation

## 2014-11-27 HISTORY — DX: Sleep apnea, unspecified: G47.30

## 2014-11-27 HISTORY — DX: Cerebral infarction, unspecified: I63.9

## 2014-11-27 HISTORY — DX: Gastro-esophageal reflux disease without esophagitis: K21.9

## 2014-11-27 MED ORDER — DEXAMETHASONE SODIUM PHOSPHATE 10 MG/ML IJ SOLN
INTRAMUSCULAR | Status: AC
  Administered 2014-11-27: 10 mg via INTRAMUSCULAR
  Filled 2014-11-27: qty 1

## 2014-11-27 MED ORDER — HYDROXYZINE HCL 10 MG PO TABS: 50.0000 mg | ORAL_TABLET | Freq: Three times a day (TID) | ORAL | Status: DC | PRN

## 2014-11-27 MED ORDER — METHYLPREDNISOLONE 4 MG PO TBPK: ORAL_TABLET | ORAL | Status: DC

## 2014-11-27 MED ORDER — HYDROXYZINE HCL 50 MG PO TABS
50.0000 mg | ORAL_TABLET | Freq: Once | ORAL | Status: AC
  Administered 2014-11-27: 50 mg via ORAL

## 2014-11-27 MED ORDER — HYDROXYZINE HCL 50 MG PO TABS: 50.0000 mg | ORAL_TABLET | Freq: Three times a day (TID) | ORAL | Status: DC | PRN

## 2014-11-27 MED ORDER — HYDROXYZINE HCL 50 MG PO TABS
ORAL_TABLET | ORAL | Status: AC
  Administered 2014-11-27: 50 mg via ORAL
  Filled 2014-11-27: qty 1

## 2014-11-27 MED ORDER — DEXAMETHASONE SODIUM PHOSPHATE 10 MG/ML IJ SOLN
10.0000 mg | Freq: Once | INTRAMUSCULAR | Status: AC
  Administered 2014-11-27: 10 mg via INTRAMUSCULAR

## 2014-11-27 NOTE — ED Notes (Signed)
RASH TO BILATERAL ARMS AND LEG,X3 DAYS , ITCHING ,

## 2014-11-27 NOTE — ED Notes (Signed)
Pt presents with rash to bilateral arms and upper left outer thigh. States it itches and burns.

## 2014-11-27 NOTE — ED Provider Notes (Signed)
[facility] Emergency Department Provider Note  ____________________________________________  Time seen: Approximately 4:14 PM  I have reviewed the triage vital signs and the nursing notes.   HISTORY  Chief Complaint Rash    HPI Destiny [name] is a 59 y.o. female complaining of rash to the bilateral arms and left leg 2-3 days. Patient states she does not know what is the provocative agent for this rash. Patient denies any new food hygiene products or G products. Patient stated there is no pain with this but intense itching. Patient has applied hydrocortisone cream to the area for any relief.   Past Medical History  Diagnosis Date  . Sleep apnea   . Stroke     x3  . GERD (gastroesophageal reflux disease)     There are no active problems to display for this patient.   Past Surgical History  Procedure Laterality Date  . Tonsillectomy    . Cholecystectomy    . Colon surgery      Current Outpatient Rx  Name  Route  Sig  Dispense  Refill  . clopidogrel (PLAVIX) 75 MG tablet   Oral   Take 75 mg by mouth daily.         Marland Kitchen esomeprazole (NEXIUM) 40 MG capsule   Oral   Take 40 mg by mouth daily at 12 noon.         . furosemide (LASIX) 40 MG tablet   Oral   Take 40 mg by mouth.         . Vitamin D, Ergocalciferol, (DRISDOL) 50000 UNITS CAPS capsule   Oral   Take 50,000 Units by mouth every 7 (seven) days.         . hydrOXYzine (ATARAX/VISTARIL) 10 MG tablet   Oral   Take 5 tablets (50 mg total) by mouth 3 (three) times daily as needed for itching.   30 tablet   0   . methylPREDNISolone (MEDROL DOSEPAK) 4 MG TBPK tablet      Take Tapered dose as directed   21 tablet   0     Allergies Diflucan  No family history on file.  Social History History  Substance Use Topics  . Smoking status: Never Smoker   . Smokeless tobacco: Not on file  . Alcohol Use: No    Review of Systems Constitutional: No fever/chills Eyes: No  visual changes. ENT: No sore throat. Cardiovascular: Denies chest pain. Respiratory: Denies shortness of breath. Gastrointestinal: No abdominal pain.  No nausea, no vomiting.  No diarrhea.  No constipation. Genitourinary: Negative for dysuria. Musculoskeletal: Negative for back pain. Skin: Hives on the bilateral arms and left leg. Neurological: Negative for headaches, focal weakness or numbness. Allergic/Immunilogical: The medication list. 10-point ROS otherwise negative.  ____________________________________________   PHYSICAL EXAM:  VITAL SIGNS: ED Triage Vitals  Enc Vitals Group     BP 11/27/14 1504 112/67 mmHg     Pulse Rate 11/27/14 1504 81     Resp 11/27/14 1504 20     Temp 11/27/14 1504 98.3 F (36.8 C)     Temp Source 11/27/14 1504 Oral     SpO2 11/27/14 1504 96 %     Weight 11/27/14 1504 197 lb (89.359 kg)     Height 11/27/14 1504 5\' 3"  (1.6 m)     Head Cir --      Peak Flow --      Pain Score --      Pain Loc --  Pain Edu? --      Excl. in [place]? --     Constitutional: Alert and oriented. Well appearing and in no acute distress. Eyes: Conjunctivae are normal. PERRL. EOMI. Head: Atraumatic. Nose: No congestion/rhinnorhea. Mouth/Throat: Mucous membranes are moist.  Oropharynx non-erythematous. Neck: No stridor.  Supple Hematological/Lymphatic/Immunilogical: No cervical lymphadenopathy. Cardiovascular: Normal rate, regular rhythm. Grossly normal heart sounds.  Good peripheral circulation. Respiratory: Normal respiratory effort.  No retractions. Lungs CTAB. Gastrointestinal: Soft and nontender. No distention. No abdominal bruits. No CVA tenderness. Musculoskeletal: No lower extremity tenderness nor edema.  No joint effusions. Neurologic:  Normal speech and language. No gross focal neurologic deficits are appreciated. Speech is normal. No gait instability. Skin:  Skin is warm, dry and intact. Erythematous macular lesions of her arms and left leg. Psychiatric:  Mood and affect are normal. Speech and behavior are normal.  ____________________________________________   LABS (all labs ordered are listed, but only abnormal results are displayed)  Labs Reviewed - No data to display ____________________________________________  EKG   ____________________________________________  RADIOLOGY   ____________________________________________   PROCEDURES  Procedure(s) performed: None  Critical Care performed: No  ____________________________________________   INITIAL IMPRESSION / ASSESSMENT AND PLAN / ED COURSE  Pertinent labs & imaging results that were available during my care of the patient were reviewed by me and considered in my medical decision making (see chart for details).  Contact dermatitis ____________________________________________   FINAL CLINICAL IMPRESSION(S) / ED DIAGNOSES  Final diagnoses:  Contact dermatitis      Sable Feil, PA-C 11/27/14 1626  Lisa Roca, MD 11/27/14 [phone]

## 2014-11-27 NOTE — Discharge Instructions (Signed)
Take medications as directed

## 2014-12-17 ENCOUNTER — Emergency Department
Admission: EM | Admit: 2014-12-17 | Discharge: 2014-12-17 | Disposition: A | Discharge status: Home / Self Care | Payer: Worker's Compensation | Attending: Student | Admitting: Student

## 2014-12-17 ENCOUNTER — Emergency Department: Payer: Worker's Compensation

## 2014-12-17 DIAGNOSIS — Y9289 Other specified places as the place of occurrence of the external cause: Secondary | ICD-10-CM | POA: Insufficient documentation

## 2014-12-17 DIAGNOSIS — Z79899 Other long term (current) drug therapy: Secondary | ICD-10-CM | POA: Insufficient documentation

## 2014-12-17 DIAGNOSIS — Z7902 Long term (current) use of antithrombotics/antiplatelets: Secondary | ICD-10-CM | POA: Insufficient documentation

## 2014-12-17 DIAGNOSIS — Y990 Civilian activity done for income or pay: Secondary | ICD-10-CM | POA: Insufficient documentation

## 2014-12-17 DIAGNOSIS — S0093XA Contusion of unspecified part of head, initial encounter: Secondary | ICD-10-CM | POA: Diagnosis not present

## 2014-12-17 DIAGNOSIS — Y9389 Activity, other specified: Secondary | ICD-10-CM | POA: Insufficient documentation

## 2014-12-17 DIAGNOSIS — W01198A Fall on same level from slipping, tripping and stumbling with subsequent striking against other object, initial encounter: Secondary | ICD-10-CM | POA: Insufficient documentation

## 2014-12-17 DIAGNOSIS — S0990XA Unspecified injury of head, initial encounter: Secondary | ICD-10-CM | POA: Diagnosis present

## 2014-12-17 MED ORDER — BUTALBITAL-APAP-CAFFEINE 50-325-40 MG PO TABS: 1.0000 | ORAL_TABLET | Freq: Four times a day (QID) | ORAL | Status: DC | PRN

## 2014-12-17 MED ORDER — TRAMADOL HCL 50 MG PO TABS
50.0000 mg | ORAL_TABLET | Freq: Once | ORAL | Status: AC
  Administered 2014-12-17: 50 mg via ORAL

## 2014-12-17 MED ORDER — TRAMADOL HCL 50 MG PO TABS
ORAL_TABLET | ORAL | Status: AC
  Filled 2014-12-17: qty 1

## 2014-12-17 NOTE — ED Notes (Signed)
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

## 2014-12-17 NOTE — ED Notes (Signed)
Pt states that she fell down and hit the back of her head and has a lot of pain.

## 2014-12-17 NOTE — ED Notes (Signed)
ED tech working on [facility] at this time.

## 2014-12-17 NOTE — ED Provider Notes (Signed)
[facility] Emergency Department Provider Note  ____________________________________________  Time seen: Approximately 11:49 AM  I have reviewed the triage vital signs and the nursing notes.   HISTORY  Chief Complaint Fall and Head Injury    HPI Destiny [name] is a 59 y.o. female plane of pain and swelling to the back of the head secondary to a fall. 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 Patient denies any loss of consciousness but stated that she has severe headache and intermittent vertigo. Patient denies any vision change. Patient is rating her pain as a 10 over 10. Patient has not taken any medication for this complaint. Patient has 3 episodes of CVA and is currently taking Plavix.  Past Medical History  Diagnosis Date  . Sleep apnea   . Stroke     x3  . GERD (gastroesophageal reflux disease)     There are no active problems to display for this patient.   Past Surgical History  Procedure Laterality Date  . Tonsillectomy    . Cholecystectomy    . Colon surgery    . Carpal tunnel release Bilateral   . Tennis elbow release/nirschel procedure Left     Current Outpatient Rx  Name  Route  Sig  Dispense  Refill  . clopidogrel (PLAVIX) 75 MG tablet   Oral   Take 75 mg by mouth daily.         Marland Kitchen esomeprazole (NEXIUM) 40 MG capsule   Oral   Take 40 mg by mouth daily at 12 noon.         . furosemide (LASIX) 40 MG tablet   Oral   Take 40 mg by mouth.         . hydrOXYzine (ATARAX/VISTARIL) 50 MG tablet   Oral   Take 1 tablet (50 mg total) by mouth 3 (three) times daily as needed.   30 tablet   0   . methylPREDNISolone (MEDROL DOSEPAK) 4 MG TBPK tablet      Take Tapered dose as directed   21 tablet   0   . Vitamin D, Ergocalciferol, (DRISDOL) 50000 UNITS CAPS capsule   Oral   Take 50,000 Units by mouth every 7 (seven) days.           Allergies Diflucan  No  family history on file.  Social History History  Substance Use Topics  . Smoking status: Never Smoker   . Smokeless tobacco: Not on file  . Alcohol Use: No    Review of Systems Constitutional: No fever/chills Eyes: No visual changes. ENT: No sore throat. Cardiovascular: Denies chest pain. Respiratory: Denies shortness of breath. Gastrointestinal: No abdominal pain.  No nausea, no vomiting.  No diarrhea.  No constipation. Genitourinary: Negative for dysuria. Musculoskeletal: Negative for back pain. Skin: Negative for rash. Neurological: Positive for HEADACHE but denies any weakness or numbness 10-point ROS otherwise negative.  ____________________________________________   PHYSICAL EXAM:  VITAL SIGNS: ED Triage Vitals  Enc Vitals Group     BP 12/17/14 1041 121/82 mmHg     Pulse Rate 12/17/14 1041 76     Resp 12/17/14 1041 18     Temp 12/17/14 1041 98.1 F (36.7 C)     Temp Source 12/17/14 1041 Oral     SpO2 12/17/14 1041 97 %     Weight 12/17/14 1041 203 lb (92.08 kg)     Height 12/17/14 1041 5\' 3"  (1.6 m)  Head Cir --      Peak Flow --      Pain Score 12/17/14 1042 10     Pain Loc --      Pain Edu? --      Excl. in [place]? --    Constitutional: Alert and oriented. Appears drowsy Eyes: Conjunctivae are normal. PERRL. EOMI. Head: Hematoma to the right occipital area. Nose: No congestion/rhinnorhea. Mouth/Throat: Mucous membranes are moist.  Oropharynx non-erythematous. Neck: No stridor. No cervical form with the free nuchal range of motion nontender palpation} Hematological/Lymphatic/Immunilogical: No cervical lymphadenopathy. Cardiovascular: Normal rate, regular rhythm. Grossly normal heart sounds.  Good peripheral circulation. Respiratory: Normal respiratory effort.  No retractions. Lungs CTAB. Gastrointestinal: Soft and nontender. No distention. No abdominal bruits. No CVA tenderness. Musculoskeletal: No lower extremity tenderness nor edema.  No joint  effusions. Neurologic:  Normal speech and language. No gross focal neurologic deficits are appreciated. Speech is normal. No gait instability. Skin:  Skin is warm, dry and intact. No rash noted. Psychiatric: Mood and affect are normal. Speech and behavior are normal.  ____________________________________________   LABS (all labs ordered are listed, but only abnormal results are displayed)  Labs Reviewed - No data to display ____________________________________________  EKG   ____________________________________________  RADIOLOGY  No acute findings on his CT. ____________________________________________   PROCEDURES  Procedure(s) performed: None  Critical Care performed: No  ____________________________________________   INITIAL IMPRESSION / ASSESSMENT AND PLAN / ED COURSE  Pertinent labs & imaging results that were available during my care of the patient were reviewed by me and considered in my medical decision making (see chart for details).  Head contusion ____________________________________________   FINAL CLINICAL IMPRESSION(S) / ED DIAGNOSES  Final diagnoses:  Head contusion, initial encounter      Sable Feil, PA-C 12/17/14 [place] Gay[place], MD 12/17/14 1454

## 2015-01-05 ENCOUNTER — Emergency Department: Payer: Self-pay

## 2015-01-05 ENCOUNTER — Encounter: Payer: Self-pay | Admitting: Medical Oncology

## 2015-01-05 ENCOUNTER — Emergency Department
Admission: EM | Admit: 2015-01-05 | Discharge: 2015-01-05 | Disposition: A | Discharge status: Home / Self Care | Payer: Self-pay | Attending: Emergency Medicine | Admitting: Emergency Medicine

## 2015-01-05 ENCOUNTER — Other Ambulatory Visit: Payer: Self-pay

## 2015-01-05 DIAGNOSIS — Z79899 Other long term (current) drug therapy: Secondary | ICD-10-CM | POA: Insufficient documentation

## 2015-01-05 DIAGNOSIS — Z7902 Long term (current) use of antithrombotics/antiplatelets: Secondary | ICD-10-CM | POA: Insufficient documentation

## 2015-01-05 DIAGNOSIS — R079 Chest pain, unspecified: Secondary | ICD-10-CM | POA: Insufficient documentation

## 2015-01-05 LAB — CBC
HEMATOCRIT: 33.7 % — AB (ref 35.0–47.0)
HEMOGLOBIN: 11.4 g/dL — AB (ref 12.0–16.0)
MCH: 30.7 pg (ref 26.0–34.0)
MCHC: 33.8 g/dL (ref 32.0–36.0)
MCV: 90.7 fL (ref 80.0–100.0)
Platelets: 177 10*3/uL (ref 150–440)
RBC: 3.72 MIL/uL — ABNORMAL LOW (ref 3.80–5.20)
RDW: 14.6 % — ABNORMAL HIGH (ref 11.5–14.5)
WBC: 7.1 10*3/uL (ref 3.6–11.0)

## 2015-01-05 LAB — BASIC METABOLIC PANEL
Anion gap: 5 (ref 5–15)
BUN: 25 mg/dL — ABNORMAL HIGH (ref 6–20)
CALCIUM: 9 mg/dL (ref 8.9–10.3)
CHLORIDE: 107 mmol/L (ref 101–111)
CO2: 27 mmol/L (ref 22–32)
Creatinine, Ser: 1.12 mg/dL — ABNORMAL HIGH (ref 0.44–1.00)
GFR calc Af Amer: >60 mL/min (ref >60)
GFR calc non Af Amer: 52 mL/min — ABNORMAL LOW (ref >60)
GLUCOSE: 107 mg/dL — AB (ref 65–99)
Potassium: 3.5 mmol/L (ref 3.5–5.1)
SODIUM: 139 mmol/L (ref 135–145)

## 2015-01-05 LAB — TROPONIN I

## 2015-01-05 MED ORDER — KETOROLAC TROMETHAMINE 30 MG/ML IJ SOLN
INTRAMUSCULAR | Status: AC
  Administered 2015-01-05: 30 mg via INTRAVENOUS
  Filled 2015-01-05: qty 1

## 2015-01-05 MED ORDER — KETOROLAC TROMETHAMINE 30 MG/ML IJ SOLN
30.0000 mg | Freq: Once | INTRAMUSCULAR | Status: AC
  Administered 2015-01-05: 30 mg via INTRAVENOUS

## 2015-01-05 MED ORDER — TRAMADOL HCL 50 MG PO TABS: 50.0000 mg | ORAL_TABLET | Freq: Four times a day (QID) | ORAL | Status: DC | PRN

## 2015-01-05 NOTE — ED Notes (Signed)
Pt reports she began having generalized chest pain off and on x 1 month, for the past week the pain has been more constant. Denies sob.

## 2015-01-05 NOTE — ED Provider Notes (Signed)
[facility] Emergency Department Provider Note  Time seen: 6:44 PM  I have reviewed the triage vital signs and the nursing notes.   HISTORY  Chief Complaint Chest Pain    HPI Destiny [name] is a 59 y.o. female with a past medical history of CVA, gastric reflux, who presents to the emergency department with on and off chest pain for the past one month. Patient describes her chest pain as sharp, worse with movement especially bending over and standing up. Pain has been going on for approximately 1 month, she has seen her primary care doctor for the same who has prescribed her naproxen. Patient states some relief with the naproxen but continues to have pain so she came to the emergency department to make sure her heart was okay. Patient denies any pain with exertion, denies shortness of breath, nausea, vomiting, or diaphoresis. Patient states the pain is sharp and severe when it occurs, otherwise she has a mild dull pain.     Past Medical History  Diagnosis Date  . Sleep apnea   . Stroke     x3  . GERD (gastroesophageal reflux disease)     There are no active problems to display for this patient.   Past Surgical History  Procedure Laterality Date  . Tonsillectomy    . Cholecystectomy    . Colon surgery    . Carpal tunnel release Bilateral   . Tennis elbow release/nirschel procedure Left     Current Outpatient Rx  Name  Route  Sig  Dispense  Refill  . butalbital-acetaminophen-caffeine (FIORICET) 50-325-40 MG per tablet   Oral   Take 1-2 tablets by mouth every 6 (six) hours as needed for headache.   20 tablet   0   . clopidogrel (PLAVIX) 75 MG tablet   Oral   Take 75 mg by mouth daily.         Marland Kitchen esomeprazole (NEXIUM) 40 MG capsule   Oral   Take 40 mg by mouth daily at 12 noon.         . furosemide (LASIX) 40 MG tablet   Oral   Take 40 mg by mouth.         . hydrOXYzine (ATARAX/VISTARIL) 50 MG tablet   Oral   Take 1 tablet (50 mg  total) by mouth 3 (three) times daily as needed.   30 tablet   0   . methylPREDNISolone (MEDROL DOSEPAK) 4 MG TBPK tablet      Take Tapered dose as directed   21 tablet   0   . Vitamin D, Ergocalciferol, (DRISDOL) 50000 UNITS CAPS capsule   Oral   Take 50,000 Units by mouth every 7 (seven) days.           Allergies Diflucan  No family history on file.  Social History History  Substance Use Topics  . Smoking status: Never Smoker   . Smokeless tobacco: Not on file  . Alcohol Use: No    Review of Systems Constitutional: Negative for fever. Cardiovascular: Positive for intermittent chest pain. Respiratory: Negative for shortness of breath. Gastrointestinal: Negative for abdominal pain Genitourinary: Negative for dysuria. Musculoskeletal: Negative for back pain. 10-point ROS otherwise negative.  ____________________________________________   PHYSICAL EXAM:  VITAL SIGNS: ED Triage Vitals  Enc Vitals Group     BP 01/05/15 1817 109/66 mmHg     Pulse Rate 01/05/15 1817 83     Resp 01/05/15 1817 18     Temp 01/05/15 1817 98.2  F (36.8 C)     Temp Source 01/05/15 1817 Oral     SpO2 01/05/15 1817 96 %     Weight 01/05/15 1817 203 lb (92.08 kg)     Height 01/05/15 1817 5\' 3"  (1.6 m)     Head Cir --      Peak Flow --      Pain Score 01/05/15 1819 9     Pain Loc --      Pain Edu? --      Excl. in [place]? --     Constitutional: Alert and oriented. Well appearing and in no distress. Eyes: Normal exam ENT   Head: Normocephalic and atraumatic.   Mouth/Throat: Mucous membranes are moist. Cardiovascular: Normal rate, regular rhythm. No murmur Respiratory: Normal respiratory effort without tachypnea nor retractions. Breath sounds are clear. Significant tenderness to palpation of her sternum. Gastrointestinal: Soft and nontender. No distention.   Musculoskeletal: Nontender with normal range of motion in all extremities. Patient does have mild right lower extremity  edema, which she states is chronic. She states she has had multiple ultrasounds which have never found a blood clots. Neurologic:  Normal speech and language. No gross focal neurologic deficits Skin:  Skin is warm, dry and intact.  Psychiatric: Mood and affect are normal. Speech and behavior are normal.   ____________________________________________    EKG  EKG reviewed and interpreted by myself shows normal sinus rhythm at 90 bpm, occasional PVC, narrow QRS, left axis deviation, no ST changes noted.  ____________________________________________    RADIOLOGY  Chest x-ray within normal limits.  ____________________________________________   INITIAL IMPRESSION / ASSESSMENT AND PLAN / ED COURSE  Pertinent labs & imaging results that were available during my care of the patient were reviewed by me and considered in my medical decision making (see chart for details).  Patient with on and off chest pain for the past one month.  Chest pain is very reproducible on exam. EKG within normal limits. We will check a chest x-ray, and cardiac enzymes. Patient likely with costochondritis.  Labs have resulted within normal limits. I discussed with patient need to follow up with her cardiologist for further workup including a stress test. Patient agreeable. We will place the patient on Ultram as needed for pain relief.  ____________________________________________   FINAL CLINICAL IMPRESSION(S) / ED DIAGNOSES  Chest pain   Harvest Dark, MD 01/05/15 [phone]

## 2015-01-05 NOTE — ED Notes (Signed)
Patient transported to X-ray 

## 2015-01-05 NOTE — Discharge Instructions (Signed)

## 2015-01-05 NOTE — ED Notes (Signed)
Patient returned from xray.

## 2015-02-06 DIAGNOSIS — R079 Chest pain, unspecified: Secondary | ICD-10-CM | POA: Insufficient documentation

## 2015-02-06 DIAGNOSIS — Z79899 Other long term (current) drug therapy: Secondary | ICD-10-CM | POA: Insufficient documentation

## 2015-02-06 DIAGNOSIS — M79601 Pain in right arm: Secondary | ICD-10-CM | POA: Insufficient documentation

## 2015-02-06 DIAGNOSIS — M792 Neuralgia and neuritis, unspecified: Secondary | ICD-10-CM | POA: Insufficient documentation

## 2015-02-06 DIAGNOSIS — Z7902 Long term (current) use of antithrombotics/antiplatelets: Secondary | ICD-10-CM | POA: Insufficient documentation

## 2015-02-06 NOTE — ED Notes (Signed)
Pt to ED c/o R arm pain, denies any specific injury.

## 2015-02-07 ENCOUNTER — Emergency Department
Admission: EM | Admit: 2015-02-07 | Discharge: 2015-02-07 | Disposition: A | Discharge status: Home / Self Care | Payer: Self-pay | Attending: Emergency Medicine | Admitting: Emergency Medicine

## 2015-02-07 ENCOUNTER — Encounter: Payer: Self-pay | Admitting: Emergency Medicine

## 2015-02-07 ENCOUNTER — Emergency Department: Payer: Self-pay

## 2015-02-07 ENCOUNTER — Other Ambulatory Visit: Payer: Self-pay

## 2015-02-07 DIAGNOSIS — M792 Neuralgia and neuritis, unspecified: Secondary | ICD-10-CM

## 2015-02-07 DIAGNOSIS — R079 Chest pain, unspecified: Secondary | ICD-10-CM

## 2015-02-07 DIAGNOSIS — R52 Pain, unspecified: Secondary | ICD-10-CM

## 2015-02-07 DIAGNOSIS — M79601 Pain in right arm: Secondary | ICD-10-CM

## 2015-02-07 LAB — CBC WITH DIFFERENTIAL/PLATELET
Basophils Absolute: 0.1 10*3/uL (ref 0–0.1)
Basophils Relative: 1 %
EOS ABS: 0.3 10*3/uL (ref 0–0.7)
EOS PCT: 4 %
HCT: 34 % — ABNORMAL LOW (ref 35.0–47.0)
HEMOGLOBIN: 11.4 g/dL — AB (ref 12.0–16.0)
Lymphocytes Relative: 28 %
Lymphs Abs: 2 10*3/uL (ref 1.0–3.6)
MCH: 30.3 pg (ref 26.0–34.0)
MCHC: 33.5 g/dL (ref 32.0–36.0)
MCV: 90.5 fL (ref 80.0–100.0)
MONO ABS: 0.7 10*3/uL (ref 0.2–0.9)
MONOS PCT: 10 %
Neutro Abs: 3.9 10*3/uL (ref 1.4–6.5)
Neutrophils Relative %: 57 %
Platelets: 195 10*3/uL (ref 150–440)
RBC: 3.76 MIL/uL — ABNORMAL LOW (ref 3.80–5.20)
RDW: 14.2 % (ref 11.5–14.5)
WBC: 7 10*3/uL (ref 3.6–11.0)

## 2015-02-07 LAB — COMPREHENSIVE METABOLIC PANEL
ALBUMIN: 3.9 g/dL (ref 3.5–5.0)
ALT: 18 U/L (ref 14–54)
AST: 23 U/L (ref 15–41)
Alkaline Phosphatase: 78 U/L (ref 38–126)
Anion gap: 7 (ref 5–15)
BUN: 22 mg/dL — ABNORMAL HIGH (ref 6–20)
CALCIUM: 9 mg/dL (ref 8.9–10.3)
CO2: 29 mmol/L (ref 22–32)
CREATININE: 0.91 mg/dL (ref 0.44–1.00)
Chloride: 104 mmol/L (ref 101–111)
GFR calc Af Amer: >60 mL/min (ref >60)
GFR calc non Af Amer: >60 mL/min (ref >60)
Glucose, Bld: 103 mg/dL — ABNORMAL HIGH (ref 65–99)
Potassium: 3.6 mmol/L (ref 3.5–5.1)
Sodium: 140 mmol/L (ref 135–145)
Total Bilirubin: 0.5 mg/dL (ref 0.3–1.2)
Total Protein: 6.9 g/dL (ref 6.5–8.1)

## 2015-02-07 LAB — TROPONIN I: Troponin I: <0.03 ng/mL (ref <0.031)

## 2015-02-07 MED ORDER — TRAMADOL HCL 50 MG PO TABS: 50.0000 mg | ORAL_TABLET | Freq: Four times a day (QID) | ORAL | Status: DC | PRN

## 2015-02-07 NOTE — ED Notes (Signed)
MD at bedside. 

## 2015-02-07 NOTE — Discharge Instructions (Signed)
Chest Pain (Nonspecific) °It is often hard to give a specific diagnosis for the cause of chest pain. There is always a chance that your pain could be related to something serious, such as a heart attack or a blood clot in the lungs. You need to follow up with your health care provider for further evaluation. °CAUSES  °· Heartburn. °· Pneumonia or bronchitis. °· Anxiety or stress. °· Inflammation around your heart (pericarditis) or lung (pleuritis or pleurisy). °· A blood clot in the lung. °· A collapsed lung (pneumothorax). It can develop suddenly on its own (spontaneous pneumothorax) or from trauma to the chest. °· Shingles infection (herpes zoster virus). °The chest wall is composed of bones, muscles, and cartilage. Any of these can be the source of the pain. °· The bones can be bruised by injury. °· The muscles or cartilage can be strained by coughing or overwork. °· The cartilage can be affected by inflammation and become sore (costochondritis). °DIAGNOSIS  °Lab tests or other studies may be needed to find the cause of your pain. Your health care provider may have you take a test called an ambulatory electrocardiogram (ECG). An ECG records your heartbeat patterns over a 24-hour period. You may also have other tests, such as: °· Transthoracic echocardiogram (TTE). During echocardiography, sound waves are used to evaluate how blood flows through your heart. °· Transesophageal echocardiogram (TEE). °· Cardiac monitoring. This allows your health care provider to monitor your heart rate and rhythm in real time. °· Holter monitor. This is a portable device that records your heartbeat and can help diagnose heart arrhythmias. It allows your health care provider to track your heart activity for several days, if needed. °· Stress tests by exercise or by giving medicine that makes the heart beat faster. °TREATMENT  °· Treatment depends on what may be causing your chest pain. Treatment may include: °¨ Acid blockers for  heartburn. °¨ Anti-inflammatory medicine. °¨ Pain medicine for inflammatory conditions. °¨ Antibiotics if an infection is present. °· You may be advised to change lifestyle habits. This includes stopping smoking and avoiding alcohol, caffeine, and chocolate. °· You may be advised to keep your head raised (elevated) when sleeping. This reduces the chance of acid going backward from your stomach into your esophagus. °Most of the time, nonspecific chest pain will improve within 2-3 days with rest and mild pain medicine.  °HOME CARE INSTRUCTIONS  °· If antibiotics were prescribed, take them as directed. Finish them even if you start to feel better. °· For the next few days, avoid physical activities that bring on chest pain. Continue physical activities as directed. °· Do not use any tobacco products, including cigarettes, chewing tobacco, or electronic cigarettes. °· Avoid drinking alcohol. °· Only take medicine as directed by your health care provider. °· Follow your health care provider's suggestions for further testing if your chest pain does not go away. °· Keep any follow-up appointments you made. If you do not go to an appointment, you could develop lasting (chronic) problems with pain. If there is any problem keeping an appointment, call to reschedule. °SEEK MEDICAL CARE IF:  °· Your chest pain does not go away, even after treatment. °· You have a rash with blisters on your chest. °· You have a fever. °SEEK IMMEDIATE MEDICAL CARE IF:  °· You have increased chest pain or pain that spreads to your arm, neck, jaw, back, or abdomen. °· You have shortness of breath. °· You have an increasing cough, or you cough   up blood.  You have severe back or abdominal pain.  You feel nauseous or vomit.  You have severe weakness.  You faint.  You have chills. This is an emergency. Do not wait to see if the pain will go away. Get medical help at once. Call your local emergency services (911 in [place] Do not drive  yourself to the hospital. MAKE SURE YOU:   Understand these instructions.  Will watch your condition.  Will get help right away if you are not doing well or get worse. Document Released: 04/13/2005 Document Revised: 07/09/2013 Document Reviewed: 02/07/2008 [facility] Patient Information 2015 [place], [place]. This information is not intended to replace advice given to you by your health care provider. Make sure you discuss any questions you have with your health care provider.        He had been seen in the emergency department today for right arm pain. This pain is likely neuropathic. Please take your Ultram as needed, as prescribed. Please wear your splint. Please follow-up with orthopedics on Monday by calling the referral number provided. You've also been seen in the emergency department for chest pain. Your workup including labs and EKG show no acute abnormalities. Please follow-up with your primary care physician regarding her continued chest pain symptoms. Return to the emergency department for any worsening chest pain, or any shortness of breath, trouble breathing, or any other symptom personally concerning to your self.

## 2015-02-07 NOTE — ED Notes (Addendum)
MD at bedside.  Pt reports CP x 3 weeks.  Pt also reports lower extremity swelling

## 2015-02-07 NOTE — ED Provider Notes (Signed)
[facility] Emergency Department Provider Note  Time seen: 3:07 AM  I have reviewed the triage vital signs and the nursing notes.   HISTORY  Chief Complaint Arm Pain and Chest Pain    HPI Destiny [name] is a 59 y.o. female with a past medical history of stroke, GERD, sleep apnea, bilateral carpal tunnel surgery, presents in the emergency department today with right arm pain times one week, intermittent, as well as intermittent chest pain times one month. According to the patient she has been having intermittent chest pain for the past one month, worse when she leans forward or bends over. Patient has seen a primary care doctor for the same. Patient also states for the past one week she has been having intermittent right arm pain she describes his pain as a sharp pain that starts in her wrist and shoots up her arm. Patient states the pain is somewhat worse, or more frequent, when she moves her right hand. Denies any shortness of breath, nausea, diaphoresis.     Past Medical History  Diagnosis Date  . Sleep apnea   . Stroke     x3  . GERD (gastroesophageal reflux disease)     There are no active problems to display for this patient.   Past Surgical History  Procedure Laterality Date  . Tonsillectomy    . Cholecystectomy    . Colon surgery    . Carpal tunnel release Bilateral   . Tennis elbow release/nirschel procedure Left     Current Outpatient Rx  Name  Route  Sig  Dispense  Refill  . butalbital-acetaminophen-caffeine (FIORICET) 50-325-40 MG per tablet   Oral   Take 1-2 tablets by mouth every 6 (six) hours as needed for headache.   20 tablet   0   . clopidogrel (PLAVIX) 75 MG tablet   Oral   Take 75 mg by mouth daily.         Marland Kitchen esomeprazole (NEXIUM) 40 MG capsule   Oral   Take 40 mg by mouth daily at 12 noon.         . furosemide (LASIX) 40 MG tablet   Oral   Take 40 mg by mouth.         . gabapentin (NEURONTIN) 600 MG tablet    Oral   Take 600 mg by mouth at bedtime.         . hydrOXYzine (ATARAX/VISTARIL) 50 MG tablet   Oral   Take 1 tablet (50 mg total) by mouth 3 (three) times daily as needed.   30 tablet   0   . naproxen (NAPROSYN) 500 MG tablet   Oral   Take 500 mg by mouth 2 (two) times daily with a meal.         . Vitamin D, Ergocalciferol, (DRISDOL) 50000 UNITS CAPS capsule   Oral   Take 50,000 Units by mouth every 7 (seven) days.         . methylPREDNISolone (MEDROL DOSEPAK) 4 MG TBPK tablet      Take Tapered dose as directed   21 tablet   0   . traMADol (ULTRAM) 50 MG tablet   Oral   Take 1 tablet (50 mg total) by mouth every 6 (six) hours as needed.   20 tablet   0     Allergies Diflucan  History reviewed. No pertinent family history.  Social History History  Substance Use Topics  . Smoking status: Never Smoker   . Smokeless  tobacco: Not on file  . Alcohol Use: No    Review of Systems Constitutional: Negative for fever. Cardiovascular: Intermittent central chest pain Respiratory: Negative for shortness of breath. Gastrointestinal: Negative for abdominal pain Neurological: Negative for headaches, focal weakness or numbness.  10-point ROS otherwise negative.  ____________________________________________   PHYSICAL EXAM:  VITAL SIGNS: ED Triage Vitals  Enc Vitals Group     BP 02/06/15 2226 108/69 mmHg     Pulse Rate 02/06/15 2226 68     Resp 02/06/15 2226 18     Temp 02/06/15 2226 98.3 F (36.8 C)     Temp Source 02/06/15 2226 Oral     SpO2 02/06/15 2226 96 %     Weight 02/06/15 2226 200 lb (90.719 kg)     Height 02/06/15 2226 5\' 2"  (1.575 m)     Head Cir --      Peak Flow --      Pain Score 02/06/15 2227 6     Pain Loc --      Pain Edu? --      Excl. in [place]? --     Constitutional: Alert and oriented. Well appearing and in no distress. ENT   Mouth/Throat: Mucous membranes are moist. Cardiovascular: Normal rate, regular rhythm. No  murmur Respiratory: Normal respiratory effort without tachypnea nor retractions. Breath sounds are clear. Moderate tenderness to central chest palpation. Gastrointestinal: Soft and nontender. No distention.  Musculoskeletal: Nontender with normal range of motion in all extremities. Mild tenderness over the right carpal tunnel. 2+ radial pulse palpated. Sensation normal. Neurologic:  Normal speech and language. No gross focal neurologic deficits are appreciated. Speech is normal. Skin:  Skin is warm, dry and intact.  Psychiatric: Mood and affect are normal. Speech and behavior are normal. ____________________________________________    EKG  EKG reviewed and interpreted by myself shows what appears to be normal sinus rhythm at 57 bpm, narrow QRS, normal axis, largely normal intervals, with nonspecific ST changes, but no ST elevations noted.  ____________________________________________    RADIOLOGY  Ultrasound within normal limits  ____________________________________________    INITIAL IMPRESSION / ASSESSMENT AND PLAN / ED COURSE  Pertinent labs & imaging results that were available during my care of the patient were reviewed by me and considered in my medical decision making (see chart for details).  Patient with intermittent right arm pain. She also states some mild swelling of her fingers. Given the arm pain with some mild swelling we'll proceed with an ultrasound of her right upper extremity to rule out deep venous thrombosis. Also has the patient has been having intermittent chest pain for the past one month we will evaluate with labs including troponin, EKG. Patient denies any chest pain at this time. Patient's right upper extremity pain does appear most consistent with neuropathic pain, possibly related to carpal tunnel.  Ultrasound negative for DVT. Labs within normal limits including negative troponin. We will place the patient in a Velcro wrist splint, have her follow up with  orthopedics this week. Patient agreeable to plan. We'll discharge home on Ultram as needed.  ____________________________________________   FINAL CLINICAL IMPRESSION(S) / ED DIAGNOSES  Chest pain Neuropathic pain    Harvest Dark, MD 02/07/15 [phone]

## 2015-02-07 NOTE — ED Notes (Signed)
Pt c/o right arm pain from fingers to elbow x 1 week.  Pain described as sharp and burning.  Pain intermittent, episodes lasting about 15 min.  Pt reports fingers feel somewhat swollen.  Pt NAD at this time.

## 2015-02-07 NOTE — ED Notes (Signed)
Pt taken to US

## 2015-06-16 ENCOUNTER — Encounter: Payer: Self-pay | Admitting: Medical Oncology

## 2015-06-16 ENCOUNTER — Emergency Department: Payer: Self-pay

## 2015-06-16 ENCOUNTER — Inpatient Hospital Stay: Payer: Self-pay

## 2015-06-16 ENCOUNTER — Inpatient Hospital Stay
Admission: EM | Admit: 2015-06-16 | Discharge: 2015-06-19 | DRG: 328 | Disposition: A | Discharge status: Home / Self Care | Payer: Self-pay | Attending: General Surgery | Admitting: General Surgery

## 2015-06-16 DIAGNOSIS — K5660 Unspecified intestinal obstruction: Principal | ICD-10-CM | POA: Diagnosis present

## 2015-06-16 DIAGNOSIS — Z7902 Long term (current) use of antithrombotics/antiplatelets: Secondary | ICD-10-CM

## 2015-06-16 DIAGNOSIS — K922 Gastrointestinal hemorrhage, unspecified: Secondary | ICD-10-CM

## 2015-06-16 DIAGNOSIS — G4730 Sleep apnea, unspecified: Secondary | ICD-10-CM | POA: Diagnosis present

## 2015-06-16 DIAGNOSIS — I2510 Atherosclerotic heart disease of native coronary artery without angina pectoris: Secondary | ICD-10-CM | POA: Diagnosis present

## 2015-06-16 DIAGNOSIS — D638 Anemia in other chronic diseases classified elsewhere: Secondary | ICD-10-CM | POA: Diagnosis present

## 2015-06-16 DIAGNOSIS — Z888 Allergy status to other drugs, medicaments and biological substances status: Secondary | ICD-10-CM

## 2015-06-16 DIAGNOSIS — I10 Essential (primary) hypertension: Secondary | ICD-10-CM | POA: Diagnosis present

## 2015-06-16 DIAGNOSIS — I059 Rheumatic mitral valve disease, unspecified: Secondary | ICD-10-CM | POA: Diagnosis present

## 2015-06-16 DIAGNOSIS — K219 Gastro-esophageal reflux disease without esophagitis: Secondary | ICD-10-CM | POA: Diagnosis present

## 2015-06-16 DIAGNOSIS — Z8673 Personal history of transient ischemic attack (TIA), and cerebral infarction without residual deficits: Secondary | ICD-10-CM

## 2015-06-16 DIAGNOSIS — K56609 Unspecified intestinal obstruction, unspecified as to partial versus complete obstruction: Secondary | ICD-10-CM | POA: Diagnosis present

## 2015-06-16 DIAGNOSIS — K5669 Other intestinal obstruction: Secondary | ICD-10-CM

## 2015-06-16 DIAGNOSIS — Z79899 Other long term (current) drug therapy: Secondary | ICD-10-CM

## 2015-06-16 DIAGNOSIS — Z7982 Long term (current) use of aspirin: Secondary | ICD-10-CM

## 2015-06-16 DIAGNOSIS — E785 Hyperlipidemia, unspecified: Secondary | ICD-10-CM | POA: Diagnosis present

## 2015-06-16 HISTORY — DX: Cerebral infarction, unspecified: I63.9

## 2015-06-16 HISTORY — DX: Essential (primary) hypertension: I10

## 2015-06-16 HISTORY — DX: Rheumatic mitral valve disease, unspecified: I05.9

## 2015-06-16 HISTORY — DX: Hyperlipidemia, unspecified: E78.5

## 2015-06-16 HISTORY — DX: Atherosclerotic heart disease of native coronary artery without angina pectoris: I25.10

## 2015-06-16 LAB — URINALYSIS COMPLETE WITH MICROSCOPIC (ARMC ONLY)
Bacteria, UA: NONE SEEN
Bilirubin Urine: NEGATIVE
Glucose, UA: NEGATIVE mg/dL
HGB URINE DIPSTICK: NEGATIVE
KETONES UR: NEGATIVE mg/dL
LEUKOCYTES UA: NEGATIVE
NITRITE: NEGATIVE
PH: 6 (ref 5.0–8.0)
Protein, ur: NEGATIVE mg/dL
SPECIFIC GRAVITY, URINE: 1.047 — AB (ref 1.005–1.030)

## 2015-06-16 LAB — COMPREHENSIVE METABOLIC PANEL
ALT: 20 U/L (ref 14–54)
AST: 23 U/L (ref 15–41)
Albumin: 3.7 g/dL (ref 3.5–5.0)
Alkaline Phosphatase: 63 U/L (ref 38–126)
Anion gap: 7 (ref 5–15)
BILIRUBIN TOTAL: 0.1 mg/dL — AB (ref 0.3–1.2)
BUN: 22 mg/dL — AB (ref 6–20)
CALCIUM: 8.9 mg/dL (ref 8.9–10.3)
CO2: 25 mmol/L (ref 22–32)
CREATININE: 0.75 mg/dL (ref 0.44–1.00)
Chloride: 105 mmol/L (ref 101–111)
GFR calc Af Amer: >60 mL/min (ref >60)
Glucose, Bld: 127 mg/dL — ABNORMAL HIGH (ref 65–99)
Potassium: 4.4 mmol/L (ref 3.5–5.1)
Sodium: 137 mmol/L (ref 135–145)
TOTAL PROTEIN: 7.5 g/dL (ref 6.5–8.1)

## 2015-06-16 LAB — CBC
HCT: 30 % — ABNORMAL LOW (ref 35.0–47.0)
Hemoglobin: 10 g/dL — ABNORMAL LOW (ref 12.0–16.0)
MCH: 29.4 pg (ref 26.0–34.0)
MCHC: 33.2 g/dL (ref 32.0–36.0)
MCV: 88.8 fL (ref 80.0–100.0)
PLATELETS: 231 10*3/uL (ref 150–440)
RBC: 3.38 MIL/uL — AB (ref 3.80–5.20)
RDW: 14.1 % (ref 11.5–14.5)
WBC: 7.9 10*3/uL (ref 3.6–11.0)

## 2015-06-16 LAB — LIPASE, BLOOD: LIPASE: 28 U/L (ref 11–51)

## 2015-06-16 LAB — TROPONIN I: Troponin I: <0.03 ng/mL (ref <0.031)

## 2015-06-16 MED ORDER — SODIUM CHLORIDE 0.9 % IV BOLUS (SEPSIS)
1000.0000 mL | Freq: Once | INTRAVENOUS | Status: AC
  Administered 2015-06-16: 1000 mL via INTRAVENOUS

## 2015-06-16 MED ORDER — CLONAZEPAM 0.5 MG PO TABS
0.5000 mg | ORAL_TABLET | Freq: Two times a day (BID) | ORAL | Status: DC
  Administered 2015-06-16 – 2015-06-19 (×4): 0.5 mg via ORAL
  Filled 2015-06-16 (×5): qty 1

## 2015-06-16 MED ORDER — TICAGRELOR 90 MG PO TABS
90.0000 mg | ORAL_TABLET | Freq: Two times a day (BID) | ORAL | Status: DC
  Administered 2015-06-16 – 2015-06-19 (×4): 90 mg via NASOGASTRIC
  Filled 2015-06-16 (×6): qty 1

## 2015-06-16 MED ORDER — ENOXAPARIN SODIUM 40 MG/0.4ML ~~LOC~~ SOLN
40.0000 mg | SUBCUTANEOUS | Status: DC
  Administered 2015-06-17 – 2015-06-18 (×2): 40 mg via SUBCUTANEOUS
  Filled 2015-06-16 (×2): qty 0.4

## 2015-06-16 MED ORDER — ONDANSETRON HCL 4 MG/2ML IJ SOLN
4.0000 mg | Freq: Four times a day (QID) | INTRAMUSCULAR | Status: DC | PRN
  Administered 2015-06-16 – 2015-06-17 (×3): 4 mg via INTRAVENOUS
  Filled 2015-06-16 (×4): qty 2

## 2015-06-16 MED ORDER — IOHEXOL 350 MG/ML SOLN
100.0000 mL | Freq: Once | INTRAVENOUS | Status: AC | PRN
  Administered 2015-06-16: 100 mL via INTRAVENOUS

## 2015-06-16 MED ORDER — LABETALOL HCL 5 MG/ML IV SOLN
10.0000 mg | INTRAVENOUS | Status: DC | PRN
  Filled 2015-06-16: qty 4

## 2015-06-16 MED ORDER — ONDANSETRON HCL 4 MG/2ML IJ SOLN
INTRAMUSCULAR | Status: AC
  Filled 2015-06-16: qty 2

## 2015-06-16 MED ORDER — DIPHENHYDRAMINE HCL 50 MG/ML IJ SOLN: 12.5000 mg | Freq: Four times a day (QID) | INTRAMUSCULAR | Status: DC | PRN

## 2015-06-16 MED ORDER — ONDANSETRON 4 MG PO TBDP: 4.0000 mg | ORAL_TABLET | Freq: Four times a day (QID) | ORAL | Status: DC | PRN

## 2015-06-16 MED ORDER — LACTATED RINGERS IV SOLN
INTRAVENOUS | Status: DC
  Administered 2015-06-16 – 2015-06-19 (×8): via INTRAVENOUS

## 2015-06-16 MED ORDER — ONDANSETRON HCL 4 MG/2ML IJ SOLN
4.0000 mg | Freq: Once | INTRAMUSCULAR | Status: AC
  Administered 2015-06-16: 4 mg via INTRAVENOUS

## 2015-06-16 MED ORDER — SERTRALINE HCL 100 MG PO TABS
100.0000 mg | ORAL_TABLET | Freq: Every day | ORAL | Status: DC
Start: 2015-06-16 — End: 2015-06-19
  Administered 2015-06-18 – 2015-06-19 (×2): 100 mg via ORAL
  Filled 2015-06-16 (×3): qty 1

## 2015-06-16 MED ORDER — ONDANSETRON HCL 4 MG/2ML IJ SOLN
4.0000 mg | Freq: Once | INTRAMUSCULAR | Status: AC
  Administered 2015-06-16: 4 mg via INTRAVENOUS
  Filled 2015-06-16: qty 2

## 2015-06-16 MED ORDER — MORPHINE SULFATE (PF) 2 MG/ML IV SOLN
2.0000 mg | INTRAVENOUS | Status: DC | PRN
  Administered 2015-06-16 – 2015-06-19 (×5): 2 mg via INTRAVENOUS
  Filled 2015-06-16 (×5): qty 1

## 2015-06-16 MED ORDER — DIPHENHYDRAMINE HCL 12.5 MG/5ML PO ELIX: 12.5000 mg | ORAL_SOLUTION | Freq: Four times a day (QID) | ORAL | Status: DC | PRN

## 2015-06-16 MED ORDER — HYDRALAZINE HCL 20 MG/ML IJ SOLN: 10.0000 mg | INTRAMUSCULAR | Status: DC | PRN

## 2015-06-16 MED ORDER — ASPIRIN 81 MG PO CHEW
81.0000 mg | CHEWABLE_TABLET | Freq: Every day | ORAL | Status: DC
  Administered 2015-06-18 – 2015-06-19 (×2): 81 mg via NASOGASTRIC
  Filled 2015-06-16 (×3): qty 1

## 2015-06-16 MED ORDER — PANTOPRAZOLE SODIUM 40 MG IV SOLR
40.0000 mg | Freq: Every day | INTRAVENOUS | Status: DC
  Administered 2015-06-16 – 2015-06-18 (×3): 40 mg via INTRAVENOUS
  Filled 2015-06-16 (×3): qty 40

## 2015-06-16 MED ORDER — IOHEXOL 240 MG/ML SOLN: 25.0000 mL | Freq: Once | INTRAMUSCULAR | Status: DC | PRN

## 2015-06-16 MED ORDER — CETYLPYRIDINIUM CHLORIDE 0.05 % MT LIQD
7.0000 mL | Freq: Two times a day (BID) | OROMUCOSAL | Status: DC
  Administered 2015-06-16 – 2015-06-19 (×6): 7 mL via OROMUCOSAL

## 2015-06-16 MED ORDER — FAMOTIDINE IN NACL 20-0.9 MG/50ML-% IV SOLN
20.0000 mg | Freq: Once | INTRAVENOUS | Status: AC
  Administered 2015-06-16: 20 mg via INTRAVENOUS
  Filled 2015-06-16: qty 50

## 2015-06-16 NOTE — H&P (Signed)
Patient ID: Destiny [name], female   DOB: [birth year], 59 y.o.   MRN: [id number]  CC: Nausea and Vomiting  HPI Destiny [name] is a 59 y.o. female presents to the emergency department with a 1 day history of nausea and vomiting. Patient states that when she awoke this morning she had a sudden onset of nausea and vomiting she's had 3 such episodes throughout the day prior to coming to the emergency room. Patient has also had runny diarrhea started after the nausea and vomiting. She denies any abdominal pain the states that she has throughout the day felt mildly bloated. She's never had anything like this before. He was brought to the emergency room by her husband who is afraid she become dehydrated. Patient had continued nausea and vomiting emergency department prompting placement of an NG tube. She's had no nausea and vomiting since NG tube was placed. Patient does have a significant history of having a cardiac stent placed approximately 4 months ago.  HPI  Past Medical History  Diagnosis Date  . Sleep apnea   . Stroke ([place])     x3  . GERD (gastroesophageal reflux disease)   . CAD (coronary artery disease)   . Mitral valve disease   . Hypertension   . CVA (cerebral infarction)   . Hyperlipidemia     Past Surgical History  Procedure Laterality Date  . Tonsillectomy    . Cholecystectomy    . Colon surgery    . Carpal tunnel release Bilateral   . Tennis elbow release/nirschel procedure Left     Family History  Problem Relation Age of Onset  . Heart disease Other   . Hypertension Other     Social History Social History  Substance Use Topics  . Smoking status: Never Smoker   . Smokeless tobacco: None  . Alcohol Use: No    Allergies  Allergen Reactions  . Atorvastatin Hives    No s/sx of anaphylaxis, just intermittent hives.  Does not seem to be a class effect as she has tolerated lovastatin previously  . Diflucan [Fluconazole] Rash    Current Facility-Administered  Medications  Medication Dose Route Frequency Provider Last Rate Last Dose  . aspirin chewable tablet 81 mg  81 mg Per NG tube Daily Srikar Sudini, MD      . iohexol (OMNIPAQUE) 240 MG/ML injection 25 mL  25 mL Oral Once PRN Orbie Pyo, MD      . ondansetron [facility]) 4 MG/2ML injection           . ticagrelor (BRILINTA) tablet 90 mg  90 mg Per NG tube BID Hillary Bow, MD       Current Outpatient Prescriptions  Medication Sig Dispense Refill  . furosemide (LASIX) 40 MG tablet Take 40 mg by mouth.    Marland Kitchen acetaminophen (TYLENOL) 500 MG tablet Take 500 mg by mouth as needed.    . butalbital-acetaminophen-caffeine (FIORICET) 50-325-40 MG per tablet Take 1-2 tablets by mouth every 6 (six) hours as needed for headache. 20 tablet 0  . Cetirizine HCl (ZYRTEC ALLERGY) 10 MG CAPS Take 10 mg by mouth as needed.    . clonazePAM (KLONOPIN) 0.5 MG tablet Take 0.5 mg by mouth 2 (two) times daily.    . clopidogrel (PLAVIX) 75 MG tablet Take 75 mg by mouth daily.    . diclofenac sodium (VOLTAREN) 1 % GEL Apply 1 application topically as needed.    Marland Kitchen esomeprazole (NEXIUM) 40 MG capsule Take 40 mg by mouth daily  at 12 noon.    . gabapentin (NEURONTIN) 600 MG tablet Take 600 mg by mouth at bedtime.    . hydrOXYzine (ATARAX/VISTARIL) 50 MG tablet Take 1 tablet (50 mg total) by mouth 3 (three) times daily as needed. 30 tablet 0  . lovastatin (MEVACOR) 40 MG tablet Take 40 mg by mouth at bedtime.    . methylPREDNISolone (MEDROL DOSEPAK) 4 MG TBPK tablet Take Tapered dose as directed 21 tablet 0  . miconazole (ZEASORB-AF) 2 % powder Apply 1 application topically as needed.    . naproxen (NAPROSYN) 500 MG tablet Take 500 mg by mouth 2 (two) times daily with a meal.    . nystatin cream (MYCOSTATIN) Apply 1 application topically as needed.    . potassium chloride SA (K-DUR,KLOR-CON) 20 MEQ tablet Take 1 tablet by mouth daily.    . sertraline (ZOLOFT) 100 MG tablet Take 100 mg by mouth daily.    . traMADol  (ULTRAM) 50 MG tablet Take 1 tablet (50 mg total) by mouth every 6 (six) hours as needed. 20 tablet 0  . Vaginal Lubricant (REPLENS) GEL Place 1 application vaginally as needed.    . Vitamin D, Ergocalciferol, (DRISDOL) 50000 UNITS CAPS capsule Take 50,000 Units by mouth every 7 (seven) days.       Review of Systems A multi-point review of systems was asked and was negative except for the positive findings documented within the history of present illness  Physical Exam Blood pressure 118/74, pulse 103, temperature 98.4 F (36.9 C), temperature source Oral, resp. rate 19, height 5\' 2"  (1.575 m), weight 97.07 kg (214 lb), SpO2 97 %. CONSTITUTIONAL: Resting in bed in no acute distress. EYES: Pupils are equal, round, and reactive to light, Sclera are non-icteric. EARS, NOSE, MOUTH AND THROAT: The oropharynx is clear. The oral mucosa is pink and moist. Hearing is intact to voice. LYMPH NODES:  Lymph nodes in the neck are normal. RESPIRATORY:  Lungs are clear. There is normal respiratory effort, with equal breath sounds bilaterally, and without pathologic use of accessory muscles. CARDIOVASCULAR: Heart is tachycardic. GI: The abdomen is soft, mildly tender in the left lower quadrant, and nondistended. There is no rebound or signs of peritonitis. There are no palpable masses. There is no hepatosplenomegaly. There are normal bowel sounds in all quadrants. GU: Rectal deferred.   MUSCULOSKELETAL: Normal muscle strength and tone. No cyanosis or edema.   SKIN: Turgor is good and there are no pathologic skin lesions or ulcers. NEUROLOGIC: Motor and sensation is grossly normal. Cranial nerves are grossly intact. PSYCH:  Oriented to person, place and time. Affect is normal.  Data Reviewed I reviewed the labs and images. CT scan does show mildly dilated loops of small bowel to progressively decrease to normal caliber. There is no clear transition point, mass or other cause of mechanical bowel obstruction.  Her labs are primarily within normal limits except for a mildly elevated BUN at 22 and a mildly decreased H&H at 10/30 I have personally reviewed the patient's imaging, laboratory findings and medical records.    Assessment    59 year old female with CT evidence of a small bowel obstruction.    Plan    59 year old female with partial small bowel traction. We will admit to the surgical service for bowel rest, NG tube decompression, IV hydration. Given her recent cardiac event and stent placement have asked internal medicine for consult to assist with medical management. Plan is to continue bowel rest until NG tube output decreases and  symptoms improved. Discussed with patient that this is either from an infectious source or from scar tissue from one of her prior surgeries. All questions were answered to the patient and her husband.     Time spent with the patient was 30 minutes, with more than 50% of the time spent in face-to-face education, counseling and care coordination.     Clayburn Pert 06/16/2015, 4:56 PM

## 2015-06-16 NOTE — ED Notes (Signed)
Pt reports she began last night having chills, vomiting and diarrhea last night. Pt reports lower abd pain.

## 2015-06-16 NOTE — ED Provider Notes (Addendum)
[facility] Emergency Department Provider Note  ____________________________________________  Time seen: Approximately [id number] AM  I have reviewed the triage vital signs and the nursing notes.   HISTORY  Chief Complaint Chills and Emesis    HPI Destiny [name] is a 59 y.o. female with a history of a stroke and reflux who is presenting today with 2 episodes of nausea vomiting and diarrhea. She says that her stools appeared dark but not black.Denies any obvious blood in her vomitus. Says that she has 2 grandchildren who have been sick with colds lately. She has also been having runny nose and cough. Denies any throat pain or ear pressure. Michela Pitcher that she came in today because her husband thought she may be getting dehydrated. Denies any abdominal pain at this time but says that she had lower abdominal cramping while vomiting.   Past Medical History  Diagnosis Date  . Sleep apnea   . Stroke ([place])     x3  . GERD (gastroesophageal reflux disease)     There are no active problems to display for this patient.   Past Surgical History  Procedure Laterality Date  . Tonsillectomy    . Cholecystectomy    . Colon surgery    . Carpal tunnel release Bilateral   . Tennis elbow release/nirschel procedure Left     Current Outpatient Rx  Name  Route  Sig  Dispense  Refill  . furosemide (LASIX) 40 MG tablet   Oral   Take 40 mg by mouth.         Marland Kitchen acetaminophen (TYLENOL) 500 MG tablet   Oral   Take 500 mg by mouth as needed.         . butalbital-acetaminophen-caffeine (FIORICET) 50-325-40 MG per tablet   Oral   Take 1-2 tablets by mouth every 6 (six) hours as needed for headache.   20 tablet   0   . Cetirizine HCl (ZYRTEC ALLERGY) 10 MG CAPS   Oral   Take 10 mg by mouth as needed.         . clonazePAM (KLONOPIN) 0.5 MG tablet   Oral   Take 0.5 mg by mouth 2 (two) times daily.         . clopidogrel (PLAVIX) 75 MG tablet   Oral   Take 75 mg by mouth  daily.         . diclofenac sodium (VOLTAREN) 1 % GEL   Topical   Apply 1 application topically as needed.         Marland Kitchen esomeprazole (NEXIUM) 40 MG capsule   Oral   Take 40 mg by mouth daily at 12 noon.         . gabapentin (NEURONTIN) 600 MG tablet   Oral   Take 600 mg by mouth at bedtime.         . hydrOXYzine (ATARAX/VISTARIL) 50 MG tablet   Oral   Take 1 tablet (50 mg total) by mouth 3 (three) times daily as needed.   30 tablet   0   . lovastatin (MEVACOR) 40 MG tablet   Oral   Take 40 mg by mouth at bedtime.         . methylPREDNISolone (MEDROL DOSEPAK) 4 MG TBPK tablet      Take Tapered dose as directed   21 tablet   0   . miconazole (ZEASORB-AF) 2 % powder   Topical   Apply 1 application topically as needed.         Marland Kitchen  naproxen (NAPROSYN) 500 MG tablet   Oral   Take 500 mg by mouth 2 (two) times daily with a meal.         . nystatin cream (MYCOSTATIN)   Topical   Apply 1 application topically as needed.         . potassium chloride SA (K-DUR,KLOR-CON) 20 MEQ tablet   Oral   Take 1 tablet by mouth daily.         . sertraline (ZOLOFT) 100 MG tablet   Oral   Take 100 mg by mouth daily.         . traMADol (ULTRAM) 50 MG tablet   Oral   Take 1 tablet (50 mg total) by mouth every 6 (six) hours as needed.   20 tablet   0   . Vaginal Lubricant (REPLENS) GEL   Vaginal   Place 1 application vaginally as needed.         . Vitamin D, Ergocalciferol, (DRISDOL) 50000 UNITS CAPS capsule   Oral   Take 50,000 Units by mouth every 7 (seven) days.           Allergies Atorvastatin and Diflucan  No family history on file.  Social History Social History  Substance Use Topics  . Smoking status: Never Smoker   . Smokeless tobacco: None  . Alcohol Use: No    Review of Systems Constitutional: No fever/chills Eyes: No visual changes. ENT: No sore throat. Cardiovascular: Denies chest pain. Respiratory: Denies shortness of  breath. Gastrointestinal: No abdominal pain.   No constipation. Genitourinary: Negative for dysuria. Musculoskeletal: Negative for back pain. Skin: Negative for rash. Neurological: Negative for headaches, focal weakness or numbness.  10-point ROS otherwise negative.  ____________________________________________   PHYSICAL EXAM:  VITAL SIGNS: ED Triage Vitals  Enc Vitals Group     BP 06/16/15 0845 106/59 mmHg     Pulse Rate 06/16/15 0845 99     Resp 06/16/15 0845 19     Temp 06/16/15 0845 98.4 F (36.9 C)     Temp Source 06/16/15 0845 Oral     SpO2 06/16/15 0845 95 %     Weight 06/16/15 0845 214 lb (97.07 kg)     Height 06/16/15 0845 5\' 2"  (1.575 m)     Head Cir --      Peak Flow --      Pain Score 06/16/15 0846 0     Pain Loc --      Pain Edu? --      Excl. in [place]? --     Constitutional: Alert and oriented. Well appearing and in no acute distress. Eyes: Conjunctivae are normal. PERRL. EOMI. Head: Atraumatic. Nose: No congestion/rhinnorhea. Mouth/Throat: Mucous membranes are moist.  Oropharynx non-erythematous. Neck: No stridor.   Cardiovascular: Normal rate, regular rhythm. Grossly normal heart sounds.  Good peripheral circulation. Respiratory: Normal respiratory effort.  No retractions. Lungs CTAB. Gastrointestinal: Soft with mild left lower quadrant tenderness palpation. There is no rebound or guarding. No distention. No abdominal bruits. No CVA tenderness. Rectal exam with grossly brown stool which is heme positive. Musculoskeletal: No lower extremity tenderness nor edema.  No joint effusions. Neurologic:  Normal speech and language. No gross focal neurologic deficits are appreciated.  Skin:  Skin is warm, dry and intact. No rash noted. Psychiatric: Mood and affect are normal. Speech and behavior are normal.  ____________________________________________   LABS (all labs ordered are listed, but only abnormal results are displayed)  Labs Reviewed  COMPREHENSIVE  METABOLIC PANEL -  Abnormal; Notable for the following:    Glucose, Bld 127 (*)    BUN 22 (*)    Total Bilirubin 0.1 (*)    All other components within normal limits  CBC - Abnormal; Notable for the following:    RBC 3.38 (*)    Hemoglobin 10.0 (*)    HCT 30.0 (*)    All other components within normal limits  URINALYSIS COMPLETEWITH MICROSCOPIC ([facility] ONLY) - Abnormal; Notable for the following:    Color, Urine YELLOW (*)    APPearance CLEAR (*)    Specific Gravity, Urine 1.047 (*)    Squamous Epithelial / LPF 0-5 (*)    All other components within normal limits  LIPASE, BLOOD  TROPONIN I   ____________________________________________  EKG  ED ECG REPORT I, Doran Stabler, the attending physician, personally viewed and interpreted this ECG.   Date: 06/16/2015  EKG Time: 9:30  Rate: 88  Rhythm: normal EKG, normal sinus rhythm  Axis: Normal axis  Intervals:none  ST&T Change: No ST segment elevation or depression. No abnormal T-wave inversion.  ____________________________________________  RADIOLOGY  Chest x-ray was stable mild changes of chronic bronchitis or asthma. No acute cardiopulmonary disease. Small bowel dilatation of the CAT scan compatible with small bowel obstruction. Cause is not identified. ____________________________________________   PROCEDURES   ____________________________________________   INITIAL IMPRESSION / ASSESSMENT AND PLAN / ED COURSE  Pertinent labs & imaging results that were available during my care of the patient were reviewed by me and considered in my medical decision making (see chart for details).  ----------------------------------------- 1 PM on 06/16/2015 ----------------------------------------- Patient now vomiting. Says has not passed gas since she has been in the emergency department. Updated about the CAT scan findings. We will start nasogastric tube. Also contacted Dr. Adonis Huguenin of the surgical service who is in  surgery right now but will be down to see the patient is in as possible. Patient aware of need for admission.  ----------------------------------------- 4:05 PM on 06/16/2015 -----------------------------------------  Dr. Adonis Huguenin of the surgical service is now in the emergency department to evaluate the patient. NG tube is in place. Patient no longer vomiting.  ____________________________________________   FINAL CLINICAL IMPRESSION(S) / ED DIAGNOSES  Small bowel obstruction. GI bleeding.    Orbie Pyo, MD 06/16/15 1606  Orbie Pyo, MD 06/16/15 [phone]

## 2015-06-16 NOTE — Consult Note (Signed)
[facility] [place] NAME: Destiny [name]    MR#:  [id number]  DATE OF BIRTH:  23-Mar-1956  DATE OF ADMISSION:  06/16/2015  PRIMARY CARE PHYSICIAN: Baltazar Apo, MD   CONSULT REQUESTING/REFERRING PHYSICIAN: Dr. Adonis Huguenin  REASON FOR CONSULT: CAD, HTN  CHIEF COMPLAINT:   Chief Complaint  Patient presents with  . Chills  . Emesis    HISTORY OF PRESENT ILLNESS:  Destiny [name]  is a 59 y.o. female with a known history of CAD, hypertension with PCI for months prior in [facility] presents to the emergency room complaining of acute onset of abdominal pain and nausea and vomiting. She has also had one episode of diarrhea. No chest pain or shortness of breath. CT scan of the abdomen showed small bowel obstruction. Consult place for her CAD and hypertension.   PAST MEDICAL HISTORY:   Past Medical History  Diagnosis Date  . Sleep apnea   . Stroke ([place])     x3  . GERD (gastroesophageal reflux disease)   . CAD (coronary artery disease)   . Mitral valve disease   . Hypertension   . CVA (cerebral infarction)   . Hyperlipidemia     PAST SURGICAL HISTOIRY:   Past Surgical History  Procedure Laterality Date  . Tonsillectomy    . Cholecystectomy    . Colon surgery    . Carpal tunnel release Bilateral   . Tennis elbow release/nirschel procedure Left     SOCIAL HISTORY:   Social History  Substance Use Topics  . Smoking status: Never Smoker   . Smokeless tobacco: Not on file  . Alcohol Use: No    FAMILY HISTORY:   Family History  Problem Relation Age of Onset  . Heart disease Other   . Hypertension Other     DRUG ALLERGIES:   Allergies  Allergen Reactions  . Atorvastatin Hives    No s/sx of anaphylaxis, just intermittent hives.  Does not seem to be a class effect as she has tolerated lovastatin previously  . Diflucan [Fluconazole] Rash    REVIEW OF SYSTEMS:   ROS  CONSTITUTIONAL: No fever, fatigue or  weakness.  EYES: No blurred or double vision.  EARS, NOSE, AND THROAT: No tinnitus or ear pain.  RESPIRATORY: No cough, shortness of breath, wheezing or hemoptysis.  CARDIOVASCULAR: No chest pain, orthopnea, edema.  GASTROINTESTINAL: Abd pain, nausea, vomiting GENITOURINARY: No dysuria, hematuria.  ENDOCRINE: No polyuria, nocturia. HEMATOLOGY: No anemia, easy bruising or bleeding. SKIN: No rash or lesion. MUSCULOSKELETAL: No joint pain or arthritis.   NEUROLOGIC: No tingling, numbness, weakness.  PSYCHIATRY: No anxiety or depression.   MEDICATIONS AT HOME:   Prior to Admission medications   Medication Sig Start Date End Date Taking? Authorizing Provider  furosemide (LASIX) 40 MG tablet Take 40 mg by mouth.   Yes Historical Provider, MD  acetaminophen (TYLENOL) 500 MG tablet Take 500 mg by mouth as needed. 10/06/11   Historical Provider, MD  butalbital-acetaminophen-caffeine (FIORICET) 50-325-40 MG per tablet Take 1-2 tablets by mouth every 6 (six) hours as needed for headache. 12/17/14   Sable Feil, PA-C  Cetirizine HCl (ZYRTEC ALLERGY) 10 MG CAPS Take 10 mg by mouth as needed. 02/02/12   Historical Provider, MD  clonazePAM (KLONOPIN) 0.5 MG tablet Take 0.5 mg by mouth 2 (two) times daily.    Historical Provider, MD  clopidogrel (PLAVIX) 75 MG tablet Take 75 mg by mouth daily.    Historical Provider,  MD  diclofenac sodium (VOLTAREN) 1 % GEL Apply 1 application topically as needed. 05/17/13   Historical Provider, MD  esomeprazole (NEXIUM) 40 MG capsule Take 40 mg by mouth daily at 12 noon.    Historical Provider, MD  gabapentin (NEURONTIN) 600 MG tablet Take 600 mg by mouth at bedtime.    Historical Provider, MD  hydrOXYzine (ATARAX/VISTARIL) 50 MG tablet Take 1 tablet (50 mg total) by mouth 3 (three) times daily as needed. 11/27/14   Sable Feil, PA-C  lovastatin (MEVACOR) 40 MG tablet Take 40 mg by mouth at bedtime. 04/17/13   Historical Provider, MD  methylPREDNISolone (MEDROL  DOSEPAK) 4 MG TBPK tablet Take Tapered dose as directed 11/27/14   Sable Feil, PA-C  miconazole (ZEASORB-AF) 2 % powder Apply 1 application topically as needed. 12/05/12   Historical Provider, MD  naproxen (NAPROSYN) 500 MG tablet Take 500 mg by mouth 2 (two) times daily with a meal.    Historical Provider, MD  nystatin cream (MYCOSTATIN) Apply 1 application topically as needed. 04/04/12   Historical Provider, MD  potassium chloride SA (K-DUR,KLOR-CON) 20 MEQ tablet Take 1 tablet by mouth daily. 08/20/07   Historical Provider, MD  sertraline (ZOLOFT) 100 MG tablet Take 100 mg by mouth daily.    Historical Provider, MD  traMADol (ULTRAM) 50 MG tablet Take 1 tablet (50 mg total) by mouth every 6 (six) hours as needed. 02/07/15 02/07/16  Harvest Dark, MD  Vaginal Lubricant (REPLENS) GEL Place 1 application vaginally as needed. 08/15/14   Historical Provider, MD  Vitamin D, Ergocalciferol, (DRISDOL) 50000 UNITS CAPS capsule Take 50,000 Units by mouth every 7 (seven) days.    Historical Provider, MD      VITAL SIGNS:  Blood pressure 118/74, pulse 103, temperature 98.4 F (36.9 C), temperature source Oral, resp. rate 19, height 5\' 2"  (1.575 m), weight 97.07 kg (214 lb), SpO2 97 %.  PHYSICAL EXAMINATION:  GENERAL:  59 y.o.-year-old patient lying in the bed with no acute distress. EYES: Pupils equal, round, reactive to light and accommodation. No scleral icterus. Extraocular muscles intact. HEENT: Head atraumatic, normocephalic. Oropharynx and nasopharynx clear.  NECK:  Supple, no jugular venous distention. No thyroid enlargement, no tenderness.  LUNGS: Normal breath sounds bilaterally, no wheezing, rales,rhonchi or crepitation. No use of accessory muscles of respiration.  CARDIOVASCULAR: S1, S2 normal. No murmurs, rubs, or gallops.  ABDOMEN: Soft, nondistended. Bowel sounds absent. No organomegaly or mass. Tenderness lower abdomen. EXTREMITIES: No pedal edema, cyanosis, or clubbing.  NEUROLOGIC:  Cranial nerves II through XII are intact. Muscle strength 5/5 in all extremities. Sensation intact. Gait not checked.  PSYCHIATRIC: The patient is alert and oriented x 3. SKIN: No obvious rash, lesion, or ulcer.  LABORATORY PANEL:   CBC  Recent Labs Lab 06/16/15 0911  WBC 7.9  HGB 10.0*  HCT 30.0*  PLT 231   ------------------------------------------------------------------------------------------------------------------  Chemistries   Recent Labs Lab 06/16/15 0911  NA 137  K 4.4  CL 105  CO2 25  GLUCOSE 127*  BUN 22*  CREATININE 0.75  CALCIUM 8.9  AST 23  ALT 20  ALKPHOS 63  BILITOT 0.1*   ------------------------------------------------------------------------------------------------------------------  Cardiac Enzymes  Recent Labs Lab 06/16/15 0911  TROPONINI <0.03   ------------------------------------------------------------------------------------------------------------------  RADIOLOGY:  Dg Chest 2 View  06/16/2015  CLINICAL DATA:  59 year old with acute onset of cough and chills yesterday. Acute onset of nausea and vomiting earlier today. Current history of coronary artery disease post stent. EXAM: CHEST  2 VIEW  COMPARISON:  01/05/2015 and earlier. FINDINGS: Cardiac silhouette normal in size, unchanged. Thoracic aorta mildly tortuous and atherosclerotic, unchanged. Hilar and mediastinal contours otherwise unremarkable. Mildly prominent bronchovascular markings diffusely and mild central peribronchial thickening, unchanged. Lungs otherwise clear. No localized airspace consolidation. No pleural effusions. No pneumothorax. Normal pulmonary vascularity. Degenerative changes and DISH involving the thoracic spine. IMPRESSION: Stable mild changes of chronic bronchitis and/or asthma. No acute cardiopulmonary disease. Electronically Signed   By: Evangeline Dakin M.D.   On: 06/16/2015 10:04   Ct Abdomen Pelvis W Contrast  06/16/2015  CLINICAL DATA:  Left lower  quadrant pain with nausea and vomiting. EXAM: CT ABDOMEN AND PELVIS WITH CONTRAST TECHNIQUE: Multidetector CT imaging of the abdomen and pelvis was performed using the standard protocol following bolus administration of intravenous contrast. CONTRAST:  11mL OMNIPAQUE IOHEXOL 350 MG/ML SOLN COMPARISON:  None. FINDINGS: Lower chest: Lung bases are clear without infiltrate or effusion. Heart size is normal. Hepatobiliary: Prior cholecystectomy. Mild biliary dilatation in the left lobe of the liver. Right biliary ducts are nondilated. Common bile duct nondilated. Normal liver function test today. No focal liver lesion. Pancreas: Negative Spleen: Negative Adrenals/Urinary Tract: Negative Stomach/Bowel: Mild small bowel dilatation containing air-fluid levels. Findings are suggestive of partial small bowel obstruction. Terminal ileum and colon are decompressed. Cause of bowel obstruction not identified. Negative for mass lesion. Small umbilical hernia which is not causing obstruction. Vascular/Lymphatic: Mild atherosclerotic disease in the abdominal aorta without aneurysm. IVC normal. Negative for lymphadenopathy. Reproductive: Uterus normal in size. 4.5 cm mass projecting posterior to the uterus compatible with uterine fibroid. This is noncalcified. No adnexal mass Other: No free fluid Musculoskeletal: No fracture or focal skeletal lesion. IMPRESSION: Mild small bowel dilatation compatible with small bowel obstruction. Cause of the obstruction not identified. 4.5 cm uterine fibroid. Post cholecystectomy. Dilatation of left intrahepatic bile ducts. Common bile duct is normal in caliber. Liver function tests are normal. This is most likely related to cholecystectomy. Electronically Signed   By: Franchot Gallo M.D.   On: 06/16/2015 12:13    EKG:   Orders placed or performed during the hospital encounter of 06/16/15  . ED EKG  . ED EKG    IMPRESSION AND PLAN:   * Small bowel obstruction NGT in place. Management  as per surgery  * CAD Patient had a stent placed 4 months back and is presently on Brilinta. Brilinta will be given crushed through her NG tube and suction held for one are after giving the dose. Same with aspirin. Discussed with Dr. Adonis Huguenin of surgery regarding this. Brilinta needs to be held at least for 3-5 days prior to surgery. Hopefully patient will improved with NG tube and bowel rest.  * Hypertension IV medications  as needed  * DVT prophylaxis SCDs  All the records are reviewed and case discussed with Consulting provider. Management plans discussed with the patient, family and they are in agreement.  CODE STATUS: FULL  TOTAL TIME TAKING CARE OF THIS PATIENT: 40 minutes.    Hillary Bow R M.D on 06/16/2015 at 4:46 PM  Between 7am to 6pm - Pager - [phone]  After 6pm go to [email or web address] - password [email or web address] [facility]  Office  [phone]  CC: Primary care Physician: Baltazar Apo, MD   Note: This dictation was prepared with Dragon dictation along with smaller phrase technology. Any transcriptional errors that result from this process are unintentional.

## 2015-06-16 NOTE — ED Notes (Signed)
Pt returned from CT. Pt reports vomiting in CT.

## 2015-06-17 ENCOUNTER — Inpatient Hospital Stay: Payer: Self-pay

## 2015-06-17 LAB — CBC
HCT: 25.1 % — ABNORMAL LOW (ref 35.0–47.0)
Hemoglobin: 8.3 g/dL — ABNORMAL LOW (ref 12.0–16.0)
MCH: 29.3 pg (ref 26.0–34.0)
MCHC: 33 g/dL (ref 32.0–36.0)
MCV: 88.9 fL (ref 80.0–100.0)
PLATELETS: 167 10*3/uL (ref 150–440)
RBC: 2.82 MIL/uL — AB (ref 3.80–5.20)
RDW: 13.7 % (ref 11.5–14.5)
WBC: 4.7 10*3/uL (ref 3.6–11.0)

## 2015-06-17 LAB — BASIC METABOLIC PANEL
ANION GAP: 4 — AB (ref 5–15)
BUN: 13 mg/dL (ref 6–20)
CALCIUM: 8.1 mg/dL — AB (ref 8.9–10.3)
CO2: 24 mmol/L (ref 22–32)
Chloride: 108 mmol/L (ref 101–111)
Creatinine, Ser: 0.59 mg/dL (ref 0.44–1.00)
GFR calc Af Amer: >60 mL/min (ref >60)
Glucose, Bld: 104 mg/dL — ABNORMAL HIGH (ref 65–99)
POTASSIUM: 3.7 mmol/L (ref 3.5–5.1)
SODIUM: 136 mmol/L (ref 135–145)

## 2015-06-17 LAB — PHOSPHORUS: Phosphorus: 2.5 mg/dL (ref 2.5–4.6)

## 2015-06-17 LAB — MAGNESIUM: MAGNESIUM: 2.1 mg/dL (ref 1.7–2.4)

## 2015-06-17 MED ORDER — PROMETHAZINE HCL 25 MG/ML IJ SOLN
12.5000 mg | Freq: Four times a day (QID) | INTRAMUSCULAR | Status: DC | PRN
  Administered 2015-06-17: 12.5 mg via INTRAVENOUS
  Filled 2015-06-17: qty 1

## 2015-06-17 MED ORDER — PROMETHAZINE HCL 25 MG PO TABS: 12.5000 mg | ORAL_TABLET | Freq: Four times a day (QID) | ORAL | Status: DC | PRN

## 2015-06-17 MED ORDER — PROMETHAZINE HCL 25 MG RE SUPP: 12.5000 mg | Freq: Four times a day (QID) | RECTAL | Status: DC | PRN

## 2015-06-17 NOTE — Progress Notes (Signed)
Per MD pt NGT was coiled up in her esophagus and needed to be removed. After removal re insertion was unsuccessful after two attempts. MD notified and stated we could leave NG out as long as pt remains without vomitus. Pt eventually vomited and NGT was inserted into left nare.

## 2015-06-17 NOTE — Plan of Care (Signed)
Problem: Nutrition: Goal: Adequate nutrition will be maintained Outcome: Not Progressing Pt is NPO  Problem: Bowel/Gastric: Goal: Will not experience complications related to bowel motility Outcome: Not Progressing NGT in place

## 2015-06-17 NOTE — Progress Notes (Signed)
CC: Nausea and vomiting Subjective: Patient admitted overnight for nausea and vomiting thought to be secondary to partial small bowel traction. Patient had multiple episodes of nausea and vomiting around her placed NG tube overnight. She states that she feels somewhat better this morning though. The pain she was having a left lower quadrant has improved.  Objective: Vital signs in last 24 hours: Temp:  [97.9 F (36.6 C)-99.7 F (37.6 C)] 97.9 F (36.6 C) (11/30 0542) Pulse Rate:  [81-103] 81 (11/30 0542) Resp:  [16-18] 18 (11/30 0542) BP: (94-132)/(54-85) 109/57 mmHg (11/30 0542) SpO2:  [96 %-100 %] 98 % (11/30 0542) Weight:  [97.659 kg (215 lb 4.8 oz)] 97.659 kg (215 lb 4.8 oz) (11/29 1805) Last BM Date: 06/17/15  Intake/Output from previous day: 11/29 0701 - 11/30 0700 In: 1110 [I.V.:1030; NG/GT:80] Out: 400 [Urine:400] Intake/Output this shift: Total I/O In: 469 [I.V.:469] Out: 300 [Urine:300]  Physical exam:  Gen.: No acute distress Chest: Clear to auscultation Heart: Regular rhythm Abdomen: Soft, nondistended, mildly tender to palpation in the left lower quadrant.  Lab Results: CBC   Recent Labs  06/16/15 0911 06/17/15 0524  WBC 7.9 4.7  HGB 10.0* 8.3*  HCT 30.0* 25.1*  PLT 231 167   BMET  Recent Labs  06/16/15 0911 06/17/15 0524  NA 137 136  K 4.4 3.7  CL 105 108  CO2 25 24  GLUCOSE 127* 104*  BUN 22* 13  CREATININE 0.75 0.59  CALCIUM 8.9 8.1*   PT/INR No results for input(s): LABPROT, INR in the last 72 hours. ABG No results for input(s): PHART, HCO3 in the last 72 hours.  Invalid input(s): PCO2, PO2  Studies/Results: Dg Chest 2 View  06/16/2015  CLINICAL DATA:  59 year old with acute onset of cough and chills yesterday. Acute onset of nausea and vomiting earlier today. Current history of coronary artery disease post stent. EXAM: CHEST  2 VIEW COMPARISON:  01/05/2015 and earlier. FINDINGS: Cardiac silhouette normal in size, unchanged.  Thoracic aorta mildly tortuous and atherosclerotic, unchanged. Hilar and mediastinal contours otherwise unremarkable. Mildly prominent bronchovascular markings diffusely and mild central peribronchial thickening, unchanged. Lungs otherwise clear. No localized airspace consolidation. No pleural effusions. No pneumothorax. Normal pulmonary vascularity. Degenerative changes and DISH involving the thoracic spine. IMPRESSION: Stable mild changes of chronic bronchitis and/or asthma. No acute cardiopulmonary disease. Electronically Signed   By: Evangeline Dakin M.D.   On: 06/16/2015 10:04   Ct Abdomen Pelvis W Contrast  06/16/2015  CLINICAL DATA:  Left lower quadrant pain with nausea and vomiting. EXAM: CT ABDOMEN AND PELVIS WITH CONTRAST TECHNIQUE: Multidetector CT imaging of the abdomen and pelvis was performed using the standard protocol following bolus administration of intravenous contrast. CONTRAST:  170mL OMNIPAQUE IOHEXOL 350 MG/ML SOLN COMPARISON:  None. FINDINGS: Lower chest: Lung bases are clear without infiltrate or effusion. Heart size is normal. Hepatobiliary: Prior cholecystectomy. Mild biliary dilatation in the left lobe of the liver. Right biliary ducts are nondilated. Common bile duct nondilated. Normal liver function test today. No focal liver lesion. Pancreas: Negative Spleen: Negative Adrenals/Urinary Tract: Negative Stomach/Bowel: Mild small bowel dilatation containing air-fluid levels. Findings are suggestive of partial small bowel obstruction. Terminal ileum and colon are decompressed. Cause of bowel obstruction not identified. Negative for mass lesion. Small umbilical hernia which is not causing obstruction. Vascular/Lymphatic: Mild atherosclerotic disease in the abdominal aorta without aneurysm. IVC normal. Negative for lymphadenopathy. Reproductive: Uterus normal in size. 4.5 cm mass projecting posterior to the uterus compatible with uterine fibroid. This  is noncalcified. No adnexal mass  Other: No free fluid Musculoskeletal: No fracture or focal skeletal lesion. IMPRESSION: Mild small bowel dilatation compatible with small bowel obstruction. Cause of the obstruction not identified. 4.5 cm uterine fibroid. Post cholecystectomy. Dilatation of left intrahepatic bile ducts. Common bile duct is normal in caliber. Liver function tests are normal. This is most likely related to cholecystectomy. Electronically Signed   By: Franchot Gallo M.D.   On: 06/16/2015 12:13   Dg Abd Portable 2v  06/17/2015  CLINICAL DATA:  Small-bowel obstruction. EXAM: PORTABLE ABDOMEN - 2 VIEW COMPARISON:  CT 06/16/2015. FINDINGS: Soft tissue structures are unremarkable. No bowel distention on today's exam. No free air. Oral contrast noted from prior CT noted in the colon. IV contrast from prior CT noted in the bladder. Degenerative changes lumbar spine. IMPRESSION: Interim resolution of small bowel distention. Electronically Signed   By: Marcello Moores  Register   On: 06/17/2015 07:24    Anti-infectives: Anti-infectives    None      Assessment/Plan:  59 year old female admitted for small bowel traction. Multiple bouts of nausea and vomiting secondary to NG tube in the esophagus and out the stomach. Patient states she actually feels somewhat better. Attempts to replace NG tube were unsuccessful. Instruct nursing staff to wait before trying again. If she has no more bouts of nausea and vomiting there'll be no need to replace the NG tube. Her bowel obstruction appears to have resolved on images. Continue bowel rest for now with IV hydration and when necessary medications.  Appreciate medicine assistance with multiple medical problems.  Marguerette Sheller T. Adonis Huguenin, MD, FACS  06/17/2015

## 2015-06-17 NOTE — Progress Notes (Signed)
[facility] [place] NAME: Nashai Vandenbroek    MR#:  [id number]  DATE OF BIRTH:  [birth year]  SUBJECTIVE:  CHIEF COMPLAINT:   Chief Complaint  Patient presents with  . Chills  . Emesis   Had a BM earlier today. Still has nausea and vomiting. NGT taken out as it was coiled up into esophagus. Afebrile  REVIEW OF SYSTEMS:    Review of Systems  Constitutional: Positive for malaise/fatigue. Negative for fever and chills.  HENT: Negative for sore throat.   Eyes: Negative for blurred vision, double vision and pain.  Respiratory: Negative for cough, hemoptysis, shortness of breath and wheezing.   Cardiovascular: Negative for chest pain, palpitations, orthopnea and leg swelling.  Gastrointestinal: Positive for nausea and vomiting. Negative for heartburn, abdominal pain, diarrhea and constipation.  Genitourinary: Negative for dysuria and hematuria.  Musculoskeletal: Negative for back pain and joint pain.  Skin: Negative for rash.  Neurological: Negative for sensory change, speech change, focal weakness and headaches.  Endo/Heme/Allergies: Does not bruise/bleed easily.  Psychiatric/Behavioral: Negative for depression. The patient is not nervous/anxious.       DRUG ALLERGIES:   Allergies  Allergen Reactions  . Atorvastatin Hives    No s/sx of anaphylaxis, just intermittent hives.  Does not seem to be a class effect as she has tolerated lovastatin previously  . Diflucan [Fluconazole] Rash    VITALS:  Blood pressure 129/88, pulse 95, temperature 97.9 F (36.6 C), temperature source Oral, resp. rate 20, height 5\' 2"  (1.575 m), weight 97.659 kg (215 lb 4.8 oz), SpO2 99 %.  PHYSICAL EXAMINATION:   Physical Exam  GENERAL:  59 y.o.-year-old patient lying in the bed with no acute distress. Anxious EYES: Pupils equal, round, reactive to light and accommodation. No scleral icterus. Extraocular muscles intact.  HEENT: Head atraumatic,  normocephalic. Oropharynx and nasopharynx clear.  NECK:  Supple, no jugular venous distention. No thyroid enlargement, no tenderness.  LUNGS: Normal breath sounds bilaterally, no wheezing, rales, rhonchi. No use of accessory muscles of respiration.  CARDIOVASCULAR: S1, S2 normal. No murmurs, rubs, or gallops.  ABDOMEN: Soft, nontender, nondistended. Bowel sounds decreased. No organomegaly or mass.  EXTREMITIES: No cyanosis, clubbing or edema b/l.    NEUROLOGIC: Cranial nerves II through XII are intact. No focal Motor or sensory deficits b/l.   PSYCHIATRIC: The patient is alert and oriented x 3.  SKIN: No obvious rash, lesion, or ulcer.    LABORATORY PANEL:   CBC  Recent Labs Lab 06/17/15 0524  WBC 4.7  HGB 8.3*  HCT 25.1*  PLT 167   ------------------------------------------------------------------------------------------------------------------  Chemistries   Recent Labs Lab 06/16/15 0911 06/17/15 0524  NA 137 136  K 4.4 3.7  CL 105 108  CO2 25 24  GLUCOSE 127* 104*  BUN 22* 13  CREATININE 0.75 0.59  CALCIUM 8.9 8.1*  MG  --  2.1  AST 23  --   ALT 20  --   ALKPHOS 63  --   BILITOT 0.1*  --    ------------------------------------------------------------------------------------------------------------------  Cardiac Enzymes  Recent Labs Lab 06/16/15 0911  TROPONINI <0.03   ------------------------------------------------------------------------------------------------------------------  RADIOLOGY:  Dg Chest 2 View  06/16/2015  CLINICAL DATA:  59 year old with acute onset of cough and chills yesterday. Acute onset of nausea and vomiting earlier today. Current history of coronary artery disease post stent. EXAM: CHEST  2 VIEW COMPARISON:  01/05/2015 and earlier. FINDINGS: Cardiac silhouette normal in size, unchanged. Thoracic aorta mildly tortuous  and atherosclerotic, unchanged. Hilar and mediastinal contours otherwise unremarkable. Mildly prominent  bronchovascular markings diffusely and mild central peribronchial thickening, unchanged. Lungs otherwise clear. No localized airspace consolidation. No pleural effusions. No pneumothorax. Normal pulmonary vascularity. Degenerative changes and DISH involving the thoracic spine. IMPRESSION: Stable mild changes of chronic bronchitis and/or asthma. No acute cardiopulmonary disease. Electronically Signed   By: Evangeline Dakin M.D.   On: 06/16/2015 10:04   Ct Abdomen Pelvis W Contrast  06/16/2015  CLINICAL DATA:  Left lower quadrant pain with nausea and vomiting. EXAM: CT ABDOMEN AND PELVIS WITH CONTRAST TECHNIQUE: Multidetector CT imaging of the abdomen and pelvis was performed using the standard protocol following bolus administration of intravenous contrast. CONTRAST:  172mL OMNIPAQUE IOHEXOL 350 MG/ML SOLN COMPARISON:  None. FINDINGS: Lower chest: Lung bases are clear without infiltrate or effusion. Heart size is normal. Hepatobiliary: Prior cholecystectomy. Mild biliary dilatation in the left lobe of the liver. Right biliary ducts are nondilated. Common bile duct nondilated. Normal liver function test today. No focal liver lesion. Pancreas: Negative Spleen: Negative Adrenals/Urinary Tract: Negative Stomach/Bowel: Mild small bowel dilatation containing air-fluid levels. Findings are suggestive of partial small bowel obstruction. Terminal ileum and colon are decompressed. Cause of bowel obstruction not identified. Negative for mass lesion. Small umbilical hernia which is not causing obstruction. Vascular/Lymphatic: Mild atherosclerotic disease in the abdominal aorta without aneurysm. IVC normal. Negative for lymphadenopathy. Reproductive: Uterus normal in size. 4.5 cm mass projecting posterior to the uterus compatible with uterine fibroid. This is noncalcified. No adnexal mass Other: No free fluid Musculoskeletal: No fracture or focal skeletal lesion. IMPRESSION: Mild small bowel dilatation compatible with small  bowel obstruction. Cause of the obstruction not identified. 4.5 cm uterine fibroid. Post cholecystectomy. Dilatation of left intrahepatic bile ducts. Common bile duct is normal in caliber. Liver function tests are normal. This is most likely related to cholecystectomy. Electronically Signed   By: Franchot Gallo M.D.   On: 06/16/2015 12:13   Dg Abd Portable 2v  06/17/2015  CLINICAL DATA:  Small-bowel obstruction. EXAM: PORTABLE ABDOMEN - 2 VIEW COMPARISON:  CT 06/16/2015. FINDINGS: Soft tissue structures are unremarkable. No bowel distention on today's exam. No free air. Oral contrast noted from prior CT noted in the colon. IV contrast from prior CT noted in the bladder. Degenerative changes lumbar spine. IMPRESSION: Interim resolution of small bowel distention. Electronically Signed   By: Marcello Moores  Register   On: 06/17/2015 07:24     ASSESSMENT AND PLAN:   * SBO Improving. NGT was in place but coiled up causing vomiting around it. Now taken out. To be replaced. Needs further bowel rest  * CAD Continue ASA, Brillinta  * HTN Well controlled IV PRN meds  * AOCD Monitor  * DVT prophylaxis Lovenox   All the records are reviewed and case discussed with Care Management/Social Workerr. Management plans discussed with the patient, family and they are in agreement.   DVT Prophylaxis: SCDs  TOTAL TIME TAKING CARE OF THIS PATIENT: 35 minutes.   POSSIBLE D/C IN 2-3 DAYS, DEPENDING ON CLINICAL CONDITION.   Hillary Bow R M.D on 06/17/2015 at 2:25 PM  Between 7am to 6pm - Pager - [phone]  After 6pm go to [email or web address] - password [email or web address] [facility]  Office  [phone]  CC: Primary care physician; Baltazar Apo, MD    Note: This dictation was prepared with Dragon dictation along with smaller phrase technology. Any transcriptional errors that result from this process are unintentional.

## 2015-06-18 LAB — BASIC METABOLIC PANEL
ANION GAP: 7 (ref 5–15)
BUN: 10 mg/dL (ref 6–20)
CALCIUM: 8.4 mg/dL — AB (ref 8.9–10.3)
CO2: 24 mmol/L (ref 22–32)
Chloride: 105 mmol/L (ref 101–111)
Creatinine, Ser: 0.55 mg/dL (ref 0.44–1.00)
GLUCOSE: 90 mg/dL (ref 65–99)
POTASSIUM: 3.7 mmol/L (ref 3.5–5.1)
Sodium: 136 mmol/L (ref 135–145)

## 2015-06-18 LAB — CBC
HEMATOCRIT: 25.8 % — AB (ref 35.0–47.0)
HEMOGLOBIN: 8.8 g/dL — AB (ref 12.0–16.0)
MCH: 30.1 pg (ref 26.0–34.0)
MCHC: 34.1 g/dL (ref 32.0–36.0)
MCV: 88.2 fL (ref 80.0–100.0)
Platelets: 172 10*3/uL (ref 150–440)
RBC: 2.92 MIL/uL — AB (ref 3.80–5.20)
RDW: 13.7 % (ref 11.5–14.5)
WBC: 5.1 10*3/uL (ref 3.6–11.0)

## 2015-06-18 LAB — PHOSPHORUS: PHOSPHORUS: 3.2 mg/dL (ref 2.5–4.6)

## 2015-06-18 LAB — MAGNESIUM: Magnesium: 2.1 mg/dL (ref 1.7–2.4)

## 2015-06-18 MED ORDER — PANTOPRAZOLE SODIUM 40 MG PO TBEC
40.0000 mg | DELAYED_RELEASE_TABLET | Freq: Once | ORAL | Status: AC
  Administered 2015-06-18: 40 mg via ORAL
  Filled 2015-06-18: qty 1

## 2015-06-18 MED ORDER — ALUM & MAG HYDROXIDE-SIMETH 200-200-20 MG/5ML PO SUSP
30.0000 mL | Freq: Once | ORAL | Status: AC
  Administered 2015-06-18: 30 mL via ORAL
  Filled 2015-06-18: qty 30

## 2015-06-18 NOTE — Progress Notes (Signed)
Informed Dr. Adonis Huguenin that pt c/o burning of chest described as worsening heart burn.  Pt states she takes Nexium BID.  Pt currently has 40 mg IV Pantoprazole QHS.  New orders were given for x1 order 40mg  pantoprazole oral.   Pt VSS 136/68 HR 82 RA 100% RR 18.  Will continue to monitor and notify MD of any changes.

## 2015-06-18 NOTE — Progress Notes (Signed)
Dr. Adonis Huguenin at bedside to review the NG tube unclamp and assessment of gastric output.  No output from NG tube (<100) therefore NG tube was pulled out.  Pt to start on a clear liquid diet.  Will continue to monitor for N/V and notify MD of any changes.

## 2015-06-18 NOTE — Progress Notes (Signed)
[facility] [place] NAME: Destiny [name]    MR#:  [id number]  DATE OF BIRTH:  12-Aug-1955  SUBJECTIVE:  CHIEF COMPLAINT:   Chief Complaint  Patient presents with  . Chills  . Emesis   Feels much better since NGT placed. Husband at bedside.   REVIEW OF SYSTEMS:    Review of Systems  Constitutional: Positive for malaise/fatigue. Negative for fever and chills.  HENT: Negative for sore throat.   Eyes: Negative for blurred vision, double vision and pain.  Respiratory: Negative for cough, hemoptysis, shortness of breath and wheezing.   Cardiovascular: Negative for chest pain, palpitations, orthopnea and leg swelling.  Gastrointestinal: Positive for nausea and vomiting. Negative for heartburn, abdominal pain, diarrhea and constipation.  Genitourinary: Negative for dysuria and hematuria.  Musculoskeletal: Negative for back pain and joint pain.  Skin: Negative for rash.  Neurological: Negative for sensory change, speech change, focal weakness and headaches.  Endo/Heme/Allergies: Does not bruise/bleed easily.  Psychiatric/Behavioral: Negative for depression. The patient is not nervous/anxious.    DRUG ALLERGIES:   Allergies  Allergen Reactions  . Atorvastatin Hives    No s/sx of anaphylaxis, just intermittent hives.  Does not seem to be a class effect as she has tolerated lovastatin previously  . Diflucan [Fluconazole] Rash    VITALS:  Blood pressure 130/76, pulse 80, temperature 97.8 F (36.6 C), temperature source Oral, resp. rate 16, height 5\' 2"  (1.575 m), weight 97.659 kg (215 lb 4.8 oz), SpO2 98 %.  PHYSICAL EXAMINATION:   Physical Exam  GENERAL:  59 y.o.-year-old patient lying in the bed with no acute distress. Anxious EYES: Pupils equal, round, reactive to light and accommodation. No scleral icterus. Extraocular muscles intact.  HEENT: Head atraumatic, normocephalic. Oropharynx and nasopharynx clear. NGT in place NECK:   Supple, no jugular venous distention. No thyroid enlargement, no tenderness.  LUNGS: Normal breath sounds bilaterally, no wheezing, rales, rhonchi. No use of accessory muscles of respiration.  CARDIOVASCULAR: S1, S2 normal. No murmurs, rubs, or gallops.  ABDOMEN: Soft, nontender, nondistended. Bowel sounds decreased. No organomegaly or mass.  EXTREMITIES: No cyanosis, clubbing or edema b/l.    NEUROLOGIC: Cranial nerves II through XII are intact. No focal Motor or sensory deficits b/l.   PSYCHIATRIC: The patient is alert and oriented x 3.  SKIN: No obvious rash, lesion, or ulcer.  LABORATORY PANEL:   CBC  Recent Labs Lab 06/18/15 0500  WBC 5.1  HGB 8.8*  HCT 25.8*  PLT 172   ------------------------------------------------------------------------------------------------------------------  Chemistries   Recent Labs Lab 06/16/15 0911  06/18/15 0500  NA 137  < > 136  K 4.4  < > 3.7  CL 105  < > 105  CO2 25  < > 24  GLUCOSE 127*  < > 90  BUN 22*  < > 10  CREATININE 0.75  < > 0.55  CALCIUM 8.9  < > 8.4*  MG  --   < > 2.1  AST 23  --   --   ALT 20  --   --   ALKPHOS 63  --   --   BILITOT 0.1*  --   --   < > = values in this interval not displayed. ------------------------------------------------------------------------------------------------------------------  Cardiac Enzymes  Recent Labs Lab 06/16/15 0911  TROPONINI <0.03   ------------------------------------------------------------------------------------------------------------------  RADIOLOGY:  Ct Abdomen Pelvis W Contrast  06/16/2015  CLINICAL DATA:  Left lower quadrant pain with nausea and vomiting. EXAM: CT ABDOMEN  AND PELVIS WITH CONTRAST TECHNIQUE: Multidetector CT imaging of the abdomen and pelvis was performed using the standard protocol following bolus administration of intravenous contrast. CONTRAST:  167mL OMNIPAQUE IOHEXOL 350 MG/ML SOLN COMPARISON:  None. FINDINGS: Lower chest: Lung bases are  clear without infiltrate or effusion. Heart size is normal. Hepatobiliary: Prior cholecystectomy. Mild biliary dilatation in the left lobe of the liver. Right biliary ducts are nondilated. Common bile duct nondilated. Normal liver function test today. No focal liver lesion. Pancreas: Negative Spleen: Negative Adrenals/Urinary Tract: Negative Stomach/Bowel: Mild small bowel dilatation containing air-fluid levels. Findings are suggestive of partial small bowel obstruction. Terminal ileum and colon are decompressed. Cause of bowel obstruction not identified. Negative for mass lesion. Small umbilical hernia which is not causing obstruction. Vascular/Lymphatic: Mild atherosclerotic disease in the abdominal aorta without aneurysm. IVC normal. Negative for lymphadenopathy. Reproductive: Uterus normal in size. 4.5 cm mass projecting posterior to the uterus compatible with uterine fibroid. This is noncalcified. No adnexal mass Other: No free fluid Musculoskeletal: No fracture or focal skeletal lesion. IMPRESSION: Mild small bowel dilatation compatible with small bowel obstruction. Cause of the obstruction not identified. 4.5 cm uterine fibroid. Post cholecystectomy. Dilatation of left intrahepatic bile ducts. Common bile duct is normal in caliber. Liver function tests are normal. This is most likely related to cholecystectomy. Electronically Signed   By: Franchot Gallo M.D.   On: 06/16/2015 12:13   Dg Abd Portable 2v  06/17/2015  CLINICAL DATA:  Small-bowel obstruction. EXAM: PORTABLE ABDOMEN - 2 VIEW COMPARISON:  CT 06/16/2015. FINDINGS: Soft tissue structures are unremarkable. No bowel distention on today's exam. No free air. Oral contrast noted from prior CT noted in the colon. IV contrast from prior CT noted in the bladder. Degenerative changes lumbar spine. IMPRESSION: Interim resolution of small bowel distention. Electronically Signed   By: Marcello Moores  Register   On: 06/17/2015 07:24     ASSESSMENT AND PLAN:   *  SBO Improving. NGT is in place - not much draining  * CAD Continue ASA, Brillinta  * HTN Well controlled IV PRN meds  * AOCD Monitor  * DVT prophylaxis Lovenox   All the records are reviewed and case discussed with Care Management/Social Worker. Management plans discussed with the patient, family (husband at bedside and Dr Adonis Huguenin) and they are in agreement.   DVT Prophylaxis: SCDs  TOTAL TIME TAKING CARE OF THIS PATIENT: 25 minutes.   D/C plans per primary team.   Max Sane M.D on 06/18/2015 at 10:31 AM  Between 7am to 6pm - Pager - [phone]  After 6pm go to [email or web address] - password [email or web address] [facility]  Office  [phone]  CC: Primary care physician; Baltazar Apo, MD    Note: This dictation was prepared with Dragon dictation along with smaller phrase technology. Any transcriptional errors that result from this process are unintentional.

## 2015-06-18 NOTE — Progress Notes (Signed)
Pt refuses bed alarm.  Pt educated on importance of safety and and fall prevention.  Will continue to monitor.

## 2015-06-18 NOTE — Progress Notes (Signed)
Per Dr. Adonis Huguenin orders were given to clamp the NG tube until 1630. NG tube clamped at this time.  The output will be recorded after 1630 to assess whether or not the NG tube will be pulled.  Will inform MD.    Will continue to monitor.

## 2015-06-18 NOTE — Progress Notes (Signed)
CC: Abdominal pain Subjective: 59 year old female admitted with partial small bowel obstruction abdominal pain. Patient states that her nausea and vomiting as well as abdominal pain has completely resolved overnight. NG tube did have several 100 mL of output however was clamped with a time of my visit due to oral medication given. She has been passing flatus. She denies any fevers, chills, nausea, vomiting.  Objective: Vital signs in last 24 hours: Temp:  [97.6 F (36.4 C)-97.8 F (36.6 C)] 97.8 F (36.6 C) (12/01 1118) Pulse Rate:  [74-95] 74 (12/01 1118) Resp:  [16-20] 18 (12/01 1118) BP: (115-136)/(64-88) 136/64 mmHg (12/01 1118) SpO2:  [98 %-100 %] 100 % (12/01 1118) Last BM Date: 06/17/15  Intake/Output from previous day: 11/30 0701 - 12/01 0700 In: 2886 [I.V.:2886] Out: 1925 [Urine:1900; Emesis/NG output:25] Intake/Output this shift: Total I/O In: 0  Out: 975 [Urine:450; Emesis/NG output:525]  Physical exam:  Gen.: No acute distress Chest: Clear to auscultation Heart: Regular rate and rhythm Abdomen: Soft, nontender, nondistended  Lab Results: CBC   Recent Labs  06/17/15 0524 06/18/15 0500  WBC 4.7 5.1  HGB 8.3* 8.8*  HCT 25.1* 25.8*  PLT 167 172   BMET  Recent Labs  06/17/15 0524 06/18/15 0500  NA 136 136  K 3.7 3.7  CL 108 105  CO2 24 24  GLUCOSE 104* 90  BUN 13 10  CREATININE 0.59 0.55  CALCIUM 8.1* 8.4*   PT/INR No results for input(s): LABPROT, INR in the last 72 hours. ABG No results for input(s): PHART, HCO3 in the last 72 hours.  Invalid input(s): PCO2, PO2  Studies/Results: Dg Abd Portable 2v  06/17/2015  CLINICAL DATA:  Small-bowel obstruction. EXAM: PORTABLE ABDOMEN - 2 VIEW COMPARISON:  CT 06/16/2015. FINDINGS: Soft tissue structures are unremarkable. No bowel distention on today's exam. No free air. Oral contrast noted from prior CT noted in the colon. IV contrast from prior CT noted in the bladder. Degenerative changes lumbar  spine. IMPRESSION: Interim resolution of small bowel distention. Electronically Signed   By: Marcello Moores  Register   On: 06/17/2015 07:24    Anti-infectives: Anti-infectives    None      Assessment/Plan:  59 year old female with apparent resolution of small bowel obstruction. Trial of NG tube clamping today for 6 hours. This started at 10:30 AM. If less than 100 mL's of output at 4:30 PM we'll remove NG tube. Once NG tube removed okay to start clear liquid diet and will advance as tolerated  Appreciate internal medicine assistance with patient's medical diseases.  Renell Coaxum T. Adonis Huguenin, MD, FACS  06/18/2015

## 2015-06-19 MED ORDER — ACETAMINOPHEN 325 MG PO TABS
650.0000 mg | ORAL_TABLET | Freq: Three times a day (TID) | ORAL | Status: DC | PRN
  Administered 2015-06-19: 650 mg via ORAL
  Filled 2015-06-19: qty 2

## 2015-06-19 NOTE — Final Progress Note (Signed)
  Patient tolerated regular diet and wishes to go home.  AFVSS Abdomen totally benign on exam  A/P 59 y/o female with resolved small bowel obstruction Discharge home F/U with PCM.  Clayburn Pert, MD FACS General Surgeon [facility]

## 2015-06-19 NOTE — Progress Notes (Signed)
CC: Small bowel obstruction Subjective: 59 year old female limited for partial small bowel traction. Did well overnight without NG tube. Tolerated clear liquid diet last night. Denies any nausea, vomiting or pain. Objective: Vital signs in last 24 hours: Temp:  [97.8 F (36.6 C)-98.6 F (37 C)] 98.2 F (36.8 C) (12/02 0510) Pulse Rate:  [74-79] 78 (12/02 0510) Resp:  [18-20] 20 (12/02 0510) BP: (116-136)/(64-66) 116/66 mmHg (12/02 0510) SpO2:  [97 %-100 %] 98 % (12/02 0510) Last BM Date: 06/18/15  Intake/Output from previous day: 12/01 0701 - 12/02 0700 In: 2762 [P.O.:30; I.V.:2732] Out: 3175 [Urine:2650; Emesis/NG output:525] Intake/Output this shift: Total I/O In: 343.8 [I.V.:343.8] Out: 900 [Urine:900]  Physical exam:  Gen.: No acute distress Chest: Clear to auscultation Heart: Regular rate and rhythm Abdomen: Soft, nontender, nondistended  Lab Results: CBC   Recent Labs  06/17/15 0524 06/18/15 0500  WBC 4.7 5.1  HGB 8.3* 8.8*  HCT 25.1* 25.8*  PLT 167 172   BMET  Recent Labs  06/17/15 0524 06/18/15 0500  NA 136 136  K 3.7 3.7  CL 108 105  CO2 24 24  GLUCOSE 104* 90  BUN 13 10  CREATININE 0.59 0.55  CALCIUM 8.1* 8.4*   PT/INR No results for input(s): LABPROT, INR in the last 72 hours. ABG No results for input(s): PHART, HCO3 in the last 72 hours.  Invalid input(s): PCO2, PO2  Studies/Results: No results found.  Anti-infectives: Anti-infectives    None      Assessment/Plan:  59 year old female with apparent resolution of partial small bowel traction. Advance diet to regular today.  If tolerates regular diet plan for discharge home later today  Honor Fairbank T. Adonis Huguenin, MD, FACS  06/19/2015

## 2015-06-19 NOTE — Progress Notes (Signed)
[facility] [place] NAME: Destiny [name]    MR#:  [id number]  DATE OF BIRTH:  06-May-1956  SUBJECTIVE:  CHIEF COMPLAINT:   Chief Complaint  Patient presents with  . Chills  . Emesis  tolerating diet. Husband at bedside.  REVIEW OF SYSTEMS:    Review of Systems  Constitutional: Positive for malaise/fatigue. Negative for fever and chills.  HENT: Negative for sore throat.   Eyes: Negative for blurred vision, double vision and pain.  Respiratory: Negative for cough, hemoptysis, shortness of breath and wheezing.   Cardiovascular: Negative for chest pain, palpitations, orthopnea and leg swelling.  Gastrointestinal: Negative for heartburn, nausea, vomiting, abdominal pain, diarrhea and constipation.  Genitourinary: Negative for dysuria and hematuria.  Musculoskeletal: Negative for back pain and joint pain.  Skin: Negative for rash.  Neurological: Negative for sensory change, speech change, focal weakness and headaches.  Endo/Heme/Allergies: Does not bruise/bleed easily.  Psychiatric/Behavioral: Negative for depression. The patient is not nervous/anxious.    DRUG ALLERGIES:   Allergies  Allergen Reactions  . Atorvastatin Hives    No s/sx of anaphylaxis, just intermittent hives.  Does not seem to be a class effect as she has tolerated lovastatin previously  . Diflucan [Fluconazole] Rash    VITALS:  Blood pressure 109/65, pulse 90, temperature 98.1 F (36.7 C), temperature source Oral, resp. rate 16, height 5\' 2"  (1.575 m), weight 97.659 kg (215 lb 4.8 oz), SpO2 98 %.  PHYSICAL EXAMINATION:   Physical Exam  GENERAL:  59 y.o.-year-old patient lying in the bed with no acute distress. Anxious EYES: Pupils equal, round, reactive to light and accommodation. No scleral icterus. Extraocular muscles intact.  HEENT: Head atraumatic, normocephalic. Oropharynx and nasopharynx clear. NGT in place NECK:  Supple, no jugular venous  distention. No thyroid enlargement, no tenderness.  LUNGS: Normal breath sounds bilaterally, no wheezing, rales, rhonchi. No use of accessory muscles of respiration.  CARDIOVASCULAR: S1, S2 normal. No murmurs, rubs, or gallops.  ABDOMEN: Soft, nontender, nondistended. Bowel sounds decreased. No organomegaly or mass.  EXTREMITIES: No cyanosis, clubbing or edema b/l.    NEUROLOGIC: Cranial nerves II through XII are intact. No focal Motor or sensory deficits b/l.   PSYCHIATRIC: The patient is alert and oriented x 3.  SKIN: No obvious rash, lesion, or ulcer.  LABORATORY PANEL:   CBC  Recent Labs Lab 06/18/15 0500  WBC 5.1  HGB 8.8*  HCT 25.8*  PLT 172   ------------------------------------------------------------------------------------------------------------------  Chemistries   Recent Labs Lab 06/16/15 0911  06/18/15 0500  NA 137  < > 136  K 4.4  < > 3.7  CL 105  < > 105  CO2 25  < > 24  GLUCOSE 127*  < > 90  BUN 22*  < > 10  CREATININE 0.75  < > 0.55  CALCIUM 8.9  < > 8.4*  MG  --   < > 2.1  AST 23  --   --   ALT 20  --   --   ALKPHOS 63  --   --   BILITOT 0.1*  --   --   < > = values in this interval not displayed. ------------------------------------------------------------------------------------------------------------------  Cardiac Enzymes  Recent Labs Lab 06/16/15 0911  TROPONINI <0.03   ------------------------------------------------------------------------------------------------------------------  RADIOLOGY:  No results found.   ASSESSMENT AND PLAN:   * SBO Resolved Tolerating diet  * CAD Continue ASA, Brillinta  * HTN Well controlled IV PRN meds  *  AOCD Monitor  * DVT prophylaxis Lovenox   All the records are reviewed and case discussed with Care Management/Social Worker. Management plans discussed with the patient, family (husband at bedside and Dr Adonis Huguenin) and they are in agreement.   DVT Prophylaxis: SCDs  TOTAL TIME  TAKING CARE OF THIS PATIENT: 15 minutes.   Medically stable for D/C.  D/w Dr Adonis Huguenin - likely D/C today.  Will sign off. Please call with any questions.   [facility] [facility], Dellanira Dillow M.D on 06/19/2015 at 1:28 PM  Between 7am to 6pm - Pager - [phone]  After 6pm go to [email or web address] - password [email or web address] [facility]  Office  [phone]  CC: Primary care physician; Baltazar Apo, MD    Note: This dictation was prepared with Dragon dictation along with smaller phrase technology. Any transcriptional errors that result from this process are unintentional.

## 2015-06-19 NOTE — Discharge Summary (Signed)
Patient ID: Destiny [name] MRN: [id number] DOB/AGE: 1955-08-03 59 y.o.  Admit date: 06/16/2015 Discharge date: 06/19/2015  Discharge Diagnoses:  Small bowel obstruction   Procedures Performed: NG Tube decompression  Discharged Condition: good  Hospital Course: Admitted from ER with bowel obstruction. Symptoms resolved with NG tube decompression. Able to tolerate regular diet prior to discharge  Discharge Orders: Discharge home  Disposition: 01-Home or Self Care  Discharge Medications:  Current facility-administered medications:  .  acetaminophen (TYLENOL) tablet 650 mg, 650 mg, Oral, Q8H PRN, Clayburn Pert, MD, 650 mg at 06/19/15 0805 .  antiseptic oral rinse (CPC / CETYLPYRIDINIUM CHLORIDE 0.05%) solution 7 mL, 7 mL, Mouth Rinse, BID, Clayburn Pert, MD, 7 mL at 06/19/15 0935 .  aspirin chewable tablet 81 mg, 81 mg, Per NG tube, Daily, Hillary Bow, MD, 81 mg at 06/19/15 0933 .  clonazePAM (KLONOPIN) tablet 0.5 mg, 0.5 mg, Oral, BID, Clayburn Pert, MD, 0.5 mg at 06/19/15 0933 .  diphenhydrAMINE (BENADRYL) 12.5 MG/5ML elixir 12.5 mg, 12.5 mg, Oral, Q6H PRN **OR** diphenhydrAMINE (BENADRYL) injection 12.5 mg, 12.5 mg, Intravenous, Q6H PRN, Clayburn Pert, MD .  enoxaparin (LOVENOX) injection 40 mg, 40 mg, Subcutaneous, Q24H, Clayburn Pert, MD, 40 mg at 06/18/15 1859 .  hydrALAZINE (APRESOLINE) injection 10 mg, 10 mg, Intravenous, Q2H PRN, Clayburn Pert, MD .  labetalol (NORMODYNE,TRANDATE) injection 10 mg, 10 mg, Intravenous, Q4H PRN, Hillary Bow, MD .  morphine 2 MG/ML injection 2 mg, 2 mg, Intravenous, Q2H PRN, Clayburn Pert, MD, 2 mg at 06/19/15 0108 .  ondansetron (ZOFRAN-ODT) disintegrating tablet 4 mg, 4 mg, Oral, Q6H PRN **OR** ondansetron (ZOFRAN) injection 4 mg, 4 mg, Intravenous, Q6H PRN, Clayburn Pert, MD, 4 mg at 06/17/15 2003 .  pantoprazole (PROTONIX) injection 40 mg, 40 mg, Intravenous, QHS, Clayburn Pert, MD, 40 mg at 06/18/15 2155 .  promethazine  (PHENERGAN) tablet 12.5 mg, 12.5 mg, Oral, Q6H PRN **OR** promethazine (PHENERGAN) injection 12.5 mg, 12.5 mg, Intravenous, Q6H PRN, 12.5 mg at 06/17/15 2335 **OR** promethazine (PHENERGAN) suppository 12.5 mg, 12.5 mg, Rectal, Q6H PRN, Sherri Rad, MD .  sertraline (ZOLOFT) tablet 100 mg, 100 mg, Oral, Daily, Clayburn Pert, MD, 100 mg at 06/19/15 0933 .  ticagrelor (BRILINTA) tablet 90 mg, 90 mg, Per NG tube, BID, Hillary Bow, MD, 90 mg at 06/19/15 [place]: Follow-up Information    Follow up with Baltazar Apo, MD. Schedule an appointment as soon as possible for a visit in 2 weeks.   Specialty:  Family Medicine   Why:  As needed   Contact information:   [address] [place] [address] [phone]       Signed: Clayburn Pert 06/19/2015, 1:59 PM

## 2015-06-19 NOTE — Discharge Instructions (Signed)
Small Bowel Obstruction °A small bowel obstruction means that something is blocking the small bowel. The small bowel is also called the small intestine. It is the long tube that connects the stomach to the colon. An obstruction will stop food and fluids from passing through the small bowel. Treatment depends on what is causing the problem and how bad the problem is. °HOME CARE °· Get a lot of rest. °· Follow your diet as told by your doctor. You may need to: °¨ Only drink clear liquids until you start to get better. °¨ Avoid solid foods as told by your doctor. °· Take over-the-counter and prescription medicines only as told by your doctor. °· Keep all follow-up visits as told by your doctor. This is important. °GET HELP IF: °· You have a fever. °· You have chills. °GET HELP RIGHT AWAY IF: °· You have pain or cramps that get worse. °· You throw up (vomit) blood. °· You have a feeling of being sick to your stomach (nausea) that does not go away. °· You cannot stop throwing up. °· You cannot drink fluids. °· You feel confused. °· You feel dry or thirsty (dehydrated). °· Your belly gets more bloated. °· You feel weak or you pass out (faint). °  °This information is not intended to replace advice given to you by your health care provider. Make sure you discuss any questions you have with your health care provider. °  °Document Released: 08/11/2004 Document Revised: 03/25/2015 Document Reviewed: 08/28/2014 °Elsevier Interactive Patient Education ©2016 Elsevier Inc[employer] ° °

## 2015-07-02 ENCOUNTER — Encounter: Payer: Self-pay | Admitting: Emergency Medicine

## 2015-07-02 ENCOUNTER — Emergency Department
Admission: EM | Admit: 2015-07-02 | Discharge: 2015-07-02 | Disposition: A | Discharge status: Home / Self Care | Payer: Self-pay | Attending: Emergency Medicine | Admitting: Emergency Medicine

## 2015-07-02 DIAGNOSIS — J069 Acute upper respiratory infection, unspecified: Secondary | ICD-10-CM | POA: Insufficient documentation

## 2015-07-02 DIAGNOSIS — Z7902 Long term (current) use of antithrombotics/antiplatelets: Secondary | ICD-10-CM | POA: Insufficient documentation

## 2015-07-02 DIAGNOSIS — I10 Essential (primary) hypertension: Secondary | ICD-10-CM | POA: Insufficient documentation

## 2015-07-02 DIAGNOSIS — J300 Vasomotor rhinitis: Secondary | ICD-10-CM | POA: Insufficient documentation

## 2015-07-02 DIAGNOSIS — Z79899 Other long term (current) drug therapy: Secondary | ICD-10-CM | POA: Insufficient documentation

## 2015-07-02 MED ORDER — BENZONATATE 100 MG PO CAPS: 100.0000 mg | ORAL_CAPSULE | Freq: Three times a day (TID) | ORAL | Status: DC | PRN

## 2015-07-02 MED ORDER — FLUTICASONE PROPIONATE 50 MCG/ACT NA SUSP: 1.0000 | Freq: Every day | NASAL | Status: DC

## 2015-07-02 NOTE — ED Provider Notes (Signed)
[facility] Emergency Department Provider Note ____________________________________________  Time seen: 1650  I have reviewed the triage vital signs and the nursing notes.  HISTORY  Chief Complaint  Cough and Nasal Congestion  HPI Destiny [name] is a 59 y.o. female was to the ED for evaluation of intermittent cough for the last week. She also describes some clear rhinorrhea and sore throat irritation for the last 5 days. Yesterday she began noticing some pressure to the ears. She is only noticed subjective fevers and the last week or so. She denies any nausea, vomiting, dizziness. She also has been without any phlegm, chest tightness, or wheezing. She is dosed and over-the-counter multisymptom cold medicine without significant change to her symptoms.  Past Medical History  Diagnosis Date  . Sleep apnea   . Stroke ([place])     x3  . GERD (gastroesophageal reflux disease)   . CAD (coronary artery disease)   . Mitral valve disease   . Hypertension   . CVA (cerebral infarction)   . Hyperlipidemia     Patient Active Problem List   Diagnosis Date Noted  . Small bowel obstruction ([place]) 06/16/2015    Past Surgical History  Procedure Laterality Date  . Tonsillectomy    . Cholecystectomy    . Colon surgery    . Carpal tunnel release Bilateral   . Tennis elbow release/nirschel procedure Left     Current Outpatient Rx  Name  Route  Sig  Dispense  Refill  . acetaminophen (TYLENOL) 500 MG tablet   Oral   Take 1,000 mg by mouth every 6 (six) hours as needed for mild pain or moderate pain.         . benzonatate (TESSALON PERLES) 100 MG capsule   Oral   Take 1 capsule (100 mg total) by mouth 3 (three) times daily as needed for cough (Take 1-2 per dose).   30 capsule   0   . cetirizine (ZYRTEC) 10 MG tablet   Oral   Take 10 mg by mouth every morning.         . Cholecalciferol (VITAMIN D3) 5000 UNITS CAPS   Oral   Take 5,000 Units by mouth daily.          . clonazePAM (KLONOPIN) 0.5 MG tablet   Oral   Take 0.5 mg by mouth 2 (two) times daily.         . clopidogrel (PLAVIX) 75 MG tablet   Oral   Take 75 mg by mouth daily.         Marland Kitchen conjugated estrogens (PREMARIN) vaginal cream   Vaginal   Place 1 Applicatorful vaginally 3 (three) times a week. At bedtime         . esomeprazole (NEXIUM) 40 MG capsule   Oral   Take 40 mg by mouth 2 (two) times daily.         . fluticasone (FLONASE) 50 MCG/ACT nasal spray   Each Nare   Place 1 spray into both nostrils daily.   16 g   0   . furosemide (LASIX) 20 MG tablet   Oral   Take 40 mg by mouth every morning.         . gabapentin (NEURONTIN) 600 MG tablet   Oral   Take 300-1,500 mg by mouth See admin instructions. 300 mg every morning, 300 mg at lunch, and 1500 mg at bedtime         . hydrocortisone cream 0.5 %  Topical   Apply 1 application topically 2 (two) times daily.         . hydrOXYzine (ATARAX/VISTARIL) 50 MG tablet   Oral   Take 50 mg by mouth 3 (three) times daily as needed for anxiety or itching.         . lovastatin (MEVACOR) 40 MG tablet   Oral   Take 40 mg by mouth at bedtime.         . naproxen (NAPROSYN) 500 MG tablet   Oral   Take 500 mg by mouth 2 (two) times daily as needed for mild pain or moderate pain.         Marland Kitchen nystatin cream (MYCOSTATIN)   Topical   Apply 1 application topically 4 (four) times daily.         . potassium chloride SA (K-DUR,KLOR-CON) 20 MEQ tablet   Oral   Take 20 mEq by mouth daily.         . sertraline (ZOLOFT) 100 MG tablet   Oral   Take 100 mg by mouth at bedtime.         . triamcinolone cream (KENALOG) 0.1 %   Topical   Apply 1 application topically 2 (two) times daily as needed (for inflammation).          Allergies Atorvastatin and Diflucan  Family History  Problem Relation Age of Onset  . Heart disease Other   . Hypertension Other    Social History Social History  Substance Use  Topics  . Smoking status: Never Smoker   . Smokeless tobacco: None  . Alcohol Use: No   Review of Systems  Constitutional: Negative for fever. Eyes: Negative for visual changes. ENT:  Positive for sore throat and runny nose. Cardiovascular: Negative for chest pain. Respiratory: Negative for shortness of breath. Gastrointestinal: Negative for abdominal pain, vomiting and diarrhea. Genitourinary: Negative for dysuria. Musculoskeletal: Negative for back pain. Skin: Negative for rash. Neurological: Negative for headaches, focal weakness or numbness. ____________________________________________  PHYSICAL EXAM:  VITAL SIGNS: ED Triage Vitals  Enc Vitals Group     BP 07/02/15 1632 111/65 mmHg     Pulse Rate 07/02/15 1632 98     Resp 07/02/15 1632 18     Temp 07/02/15 1632 97.8 F (36.6 C)     Temp Source 07/02/15 1632 Oral     SpO2 07/02/15 1632 100 %     Weight 07/02/15 1632 210 lb (95.255 kg)     Height 07/02/15 1632 5\' 2"  (1.575 m)     Head Cir --      Peak Flow --      Pain Score 07/02/15 1639 8     Pain Loc --      Pain Edu? --      Excl. in [place]? --    Constitutional: Alert and oriented. Well appearing and in no distress. Head: Normocephalic and atraumatic.      Eyes: Conjunctivae are normal. PERRL. Normal extraocular movements      Ears: Canals clear. TMs intact bilaterally.   Nose: No congestion, but clear rhinorrhea.   Mouth/Throat: Mucous membranes are moist. Uvula midline. Tonsils flat.   Neck: Supple. No thyromegaly. Hematological/Lymphatic/Immunological: No cervical lymphadenopathy. Cardiovascular: Normal rate, regular rhythm.  Respiratory: Normal respiratory effort. No wheezes/rales/rhonchi. Gastrointestinal: Soft and nontender. No distention. Musculoskeletal: Nontender with normal range of motion in all extremities.  Neurologic:  Normal gait without ataxia. Normal speech and language. No gross focal neurologic deficits are appreciated. Skin:  Skin  is warm, dry and intact. No rash noted. Psychiatric: Mood and affect are normal. Patient exhibits appropriate insight and judgment. ____________________________________________  INITIAL IMPRESSION / ASSESSMENT AND PLAN / ED COURSE  Patient with symptoms consistent with a rhinitis URI likely viral in etiology. She is encouraged to discontinue any guaifenesin-containing medicines over-the-counter. She will be discharged with a prescription for Flonase and Tessalon Perles dose as directed. She is also advised to start an OTC allergy medicine for symptom relief. She will follow-up with her provider for ongoing symptoms.  ____________________________________________  FINAL CLINICAL IMPRESSION(S) / ED DIAGNOSES  Final diagnoses:  Vasomotor rhinitis  URI, acute      Melvenia Needles, PA-C 07/02/15 [place], MD 07/02/15 2028

## 2015-07-02 NOTE — Discharge Instructions (Signed)
Cough, Adult A cough helps to clear your throat and lungs. A cough may last only 2-3 weeks (acute), or it may last longer than 8 weeks (chronic). Many different things can cause a cough. A cough may be a sign of an illness or another medical condition. HOME CARE  Pay attention to any changes in your cough.  Take medicines only as told by your doctor.  If you were prescribed an antibiotic medicine, take it as told by your doctor. Do not stop taking it even if you start to feel better.  Talk with your doctor before you try using a cough medicine.  Drink enough fluid to keep your pee (urine) clear or pale yellow.  If the air is dry, use a cold steam vaporizer or humidifier in your home.  Stay away from things that make you cough at work or at home.  If your cough is worse at night, try using extra pillows to raise your head up higher while you sleep.  Do not smoke, and try not to be around smoke. If you need help quitting, ask your doctor.  Do not have caffeine.  Do not drink alcohol.  Rest as needed. GET HELP IF:  You have new problems (symptoms).  You cough up yellow fluid (pus).  Your cough does not get better after 2-3 weeks, or your cough gets worse.  Medicine does not help your cough and you are not sleeping well.  You have pain that gets worse or pain that is not helped with medicine.  You have a fever.  You are losing weight and you do not know why.  You have night sweats. GET HELP RIGHT AWAY IF:  You cough up blood.  You have trouble breathing.  Your heartbeat is very fast.   This information is not intended to replace advice given to you by your health care provider. Make sure you discuss any questions you have with your health care provider.   Document Released: 03/17/2011 Document Revised: 03/25/2015 Document Reviewed: 09/10/2014 Elsevier Interactive Patient Education 2016 [place].  Dose the prescription meds as directed. Consider starting an  OTC allergy medicine (Allegra, Claritin, or Zyrtec). Follow-up with your provider for ongoing symptoms.

## 2015-07-02 NOTE — ED Notes (Signed)
C/o cough, congestion, sore throat, and right ear pain

## 2015-09-15 ENCOUNTER — Emergency Department
Admission: EM | Admit: 2015-09-15 | Discharge: 2015-09-15 | Disposition: A | Discharge status: Home / Self Care | Payer: Self-pay | Attending: Emergency Medicine | Admitting: Emergency Medicine

## 2015-09-15 ENCOUNTER — Encounter: Payer: Self-pay | Admitting: Medical Oncology

## 2015-09-15 DIAGNOSIS — I10 Essential (primary) hypertension: Secondary | ICD-10-CM | POA: Insufficient documentation

## 2015-09-15 DIAGNOSIS — J0120 Acute ethmoidal sinusitis, unspecified: Secondary | ICD-10-CM | POA: Insufficient documentation

## 2015-09-15 DIAGNOSIS — Z79899 Other long term (current) drug therapy: Secondary | ICD-10-CM | POA: Insufficient documentation

## 2015-09-15 DIAGNOSIS — Z7902 Long term (current) use of antithrombotics/antiplatelets: Secondary | ICD-10-CM | POA: Insufficient documentation

## 2015-09-15 DIAGNOSIS — E785 Hyperlipidemia, unspecified: Secondary | ICD-10-CM | POA: Insufficient documentation

## 2015-09-15 DIAGNOSIS — Z792 Long term (current) use of antibiotics: Secondary | ICD-10-CM | POA: Insufficient documentation

## 2015-09-15 MED ORDER — AMOXICILLIN 500 MG PO CAPS: 500.0000 mg | ORAL_CAPSULE | Freq: Three times a day (TID) | ORAL | Status: DC

## 2015-09-15 MED ORDER — FLUCONAZOLE 150 MG PO TABS: 150.0000 mg | ORAL_TABLET | Freq: Every day | ORAL | Status: DC

## 2015-09-15 MED ORDER — PSEUDOEPH-BROMPHEN-DM 30-2-10 MG/5ML PO SYRP: 5.0000 mL | ORAL_SOLUTION | Freq: Four times a day (QID) | ORAL | Status: DC | PRN

## 2015-09-15 NOTE — ED Notes (Signed)
Pt reports cough and sore throat since Sunday. Denies fever.

## 2015-09-15 NOTE — ED Notes (Signed)
Cough and sore throat since Sunday   Worse today

## 2015-09-15 NOTE — ED Provider Notes (Signed)
[facility] Emergency Department Provider Note  ____________________________________________  Time seen: Approximately 12:54 PM  I have reviewed the triage vital signs and the nursing notes.   HISTORY  Chief Complaint Cough and Sore Throat    HPI Destiny [name] is a 60 y.o. female patient complain of cough and sore throat for 3 days. Denies any fevers this complaint. Patient states 7-10 days of sinus congestion. Patient also complaining of ear congestion. Patient state her cough is nonproductive. No palliative measures taken for this complaint. Patient rates the pain discomfort as a 9/10.   Past Medical History  Diagnosis Date  . Sleep apnea   . Stroke ([place])     x3  . GERD (gastroesophageal reflux disease)   . CAD (coronary artery disease)   . Mitral valve disease   . Hypertension   . CVA (cerebral infarction)   . Hyperlipidemia     Patient Active Problem List   Diagnosis Date Noted  . Small bowel obstruction ([place]) 06/16/2015    Past Surgical History  Procedure Laterality Date  . Tonsillectomy    . Cholecystectomy    . Colon surgery    . Carpal tunnel release Bilateral   . Tennis elbow release/nirschel procedure Left     Current Outpatient Rx  Name  Route  Sig  Dispense  Refill  . acetaminophen (TYLENOL) 500 MG tablet   Oral   Take 1,000 mg by mouth every 6 (six) hours as needed for mild pain or moderate pain.         Marland Kitchen amoxicillin (AMOXIL) 500 MG capsule   Oral   Take 1 capsule (500 mg total) by mouth 3 (three) times daily.   30 capsule   0   . benzonatate (TESSALON PERLES) 100 MG capsule   Oral   Take 1 capsule (100 mg total) by mouth 3 (three) times daily as needed for cough (Take 1-2 per dose).   30 capsule   0   . brompheniramine-pseudoephedrine-DM 30-2-10 MG/5ML syrup   Oral   Take 5 mLs by mouth 4 (four) times daily as needed.   120 mL   0   . cetirizine (ZYRTEC) 10 MG tablet   Oral   Take 10 mg by mouth every  morning.         . Cholecalciferol (VITAMIN D3) 5000 UNITS CAPS   Oral   Take 5,000 Units by mouth daily.         . clonazePAM (KLONOPIN) 0.5 MG tablet   Oral   Take 0.5 mg by mouth 2 (two) times daily.         . clopidogrel (PLAVIX) 75 MG tablet   Oral   Take 75 mg by mouth daily.         Marland Kitchen conjugated estrogens (PREMARIN) vaginal cream   Vaginal   Place 1 Applicatorful vaginally 3 (three) times a week. At bedtime         . esomeprazole (NEXIUM) 40 MG capsule   Oral   Take 40 mg by mouth 2 (two) times daily.         . fluconazole (DIFLUCAN) 150 MG tablet   Oral   Take 1 tablet (150 mg total) by mouth daily.   2 tablet   0   . fluticasone (FLONASE) 50 MCG/ACT nasal spray   Each Nare   Place 1 spray into both nostrils daily.   16 g   0   . furosemide (LASIX) 20 MG tablet  Oral   Take 40 mg by mouth every morning.         . gabapentin (NEURONTIN) 600 MG tablet   Oral   Take 300-1,500 mg by mouth See admin instructions. 300 mg every morning, 300 mg at lunch, and 1500 mg at bedtime         . hydrocortisone cream 0.5 %   Topical   Apply 1 application topically 2 (two) times daily.         . hydrOXYzine (ATARAX/VISTARIL) 50 MG tablet   Oral   Take 50 mg by mouth 3 (three) times daily as needed for anxiety or itching.         . lovastatin (MEVACOR) 40 MG tablet   Oral   Take 40 mg by mouth at bedtime.         . naproxen (NAPROSYN) 500 MG tablet   Oral   Take 500 mg by mouth 2 (two) times daily as needed for mild pain or moderate pain.         Marland Kitchen nystatin cream (MYCOSTATIN)   Topical   Apply 1 application topically 4 (four) times daily.         . potassium chloride SA (K-DUR,KLOR-CON) 20 MEQ tablet   Oral   Take 20 mEq by mouth daily.         . sertraline (ZOLOFT) 100 MG tablet   Oral   Take 100 mg by mouth at bedtime.         . triamcinolone cream (KENALOG) 0.1 %   Topical   Apply 1 application topically 2 (two) times daily  as needed (for inflammation).           Allergies Atorvastatin and Diflucan  Family History  Problem Relation Age of Onset  . Heart disease Other   . Hypertension Other     Social History Social History  Substance Use Topics  . Smoking status: Never Smoker   . Smokeless tobacco: None  . Alcohol Use: No    Review of Systems Constitutional: No fever/chills Eyes: No visual changes. ENT: Sore throat. Sinus congestion. Bilateral ear pressure Cardiovascular: Denies chest pain. Respiratory: Denies shortness of breath. Nonproductive cough Gastrointestinal: No abdominal pain.  No nausea, no vomiting.  No diarrhea.  No constipation. Genitourinary: Negative for dysuria. Musculoskeletal: Negative for back pain. Skin: Negative for rash. Neurological: Negative for headaches, focal weakness or numbness. Endocrine: Hypertension and hyperlipidemia. Allergic/Immunilogical: Generic Diflucan 10-point ROS otherwise negative.  ____________________________________________   PHYSICAL EXAM:  VITAL SIGNS: ED Triage Vitals  Enc Vitals Group     BP 09/15/15 1210 102/64 mmHg     Pulse Rate 09/15/15 1208 91     Resp 09/15/15 1208 18     Temp 09/15/15 1208 98.4 F (36.9 C)     Temp Source 09/15/15 1208 Oral     SpO2 09/15/15 1208 98 %     Weight 09/15/15 1208 210 lb (95.255 kg)     Height 09/15/15 1208 5\' 2"  (1.575 m)     Head Cir --      Peak Flow --      Pain Score 09/15/15 1209 9     Pain Loc --      Pain Edu? --      Excl. in [place]? --     Constitutional: Alert and oriented. Well appearing and in no acute distress. Eyes: Conjunctivae are normal. PERRL. EOMI. Head: Atraumatic. Nose: Bilateral ethmoid sinus guarding. Bilateral edematous nasal turbinates. Mouth/Throat: Mucous membranes  are moist.  Oropharynx erythematous. Neck: No stridor. No cervical spine tenderness to palpation. Hematological/Lymphatic/Immunilogical: No cervical lymphadenopathy. Cardiovascular: Normal rate,  regular rhythm. Grossly normal heart sounds.  Good peripheral circulation. Respiratory: Normal respiratory effort.  No retractions. Lungs CTAB. Gastrointestinal: Soft and nontender. No distention. No abdominal bruits. No CVA tenderness. Musculoskeletal: No lower extremity tenderness nor edema.  No joint effusions. Neurologic:  Normal speech and language. No gross focal neurologic deficits are appreciated. No gait instability. Skin:  Skin is warm, dry and intact. No rash noted. Psychiatric: Mood and affect are normal. Speech and behavior are normal.  ____________________________________________   LABS (all labs ordered are listed, but only abnormal results are displayed)  Labs Reviewed - No data to display ____________________________________________  EKG   ____________________________________________  RADIOLOGY   ____________________________________________   PROCEDURES  Procedure(s) performed: None  Critical Care performed: No  ____________________________________________   INITIAL IMPRESSION / ASSESSMENT AND PLAN / ED COURSE  Pertinent labs & imaging results that were available during my care of the patient were reviewed by me and considered in my medical decision making (see chart for details).  Sinusitis. Patient given a prescription for amoxicillin, Bromfed-DM, and Diflucan. Patient given discharge care instructions and advised follow-up family doctor is no improvement in 3-5 days. ____________________________________________   FINAL CLINICAL IMPRESSION(S) / ED DIAGNOSES  Final diagnoses:  Acute ethmoidal sinusitis, recurrence not specified      Sable Feil, PA-C 09/15/15 [place], MD 09/15/15 1723

## 2015-09-15 NOTE — Discharge Instructions (Signed)
Sinusitis, Adult  Sinusitis is redness, soreness, and puffiness (inflammation) of the air pockets in the bones of your face (sinuses). The redness, soreness, and puffiness can cause air and mucus to get trapped in your sinuses. This can allow germs to grow and cause an infection.   HOME CARE    Drink enough fluids to keep your pee (urine) clear or pale yellow.   Use a humidifier in your home.   Run a hot shower to create steam in the bathroom. Sit in the bathroom with the door closed. Breathe in the steam 3-4 times a day.   Put a warm, moist washcloth on your face 3-4 times a day, or as told by your doctor.   Use salt water sprays (saline sprays) to wet the thick fluid in your nose. This can help the sinuses drain.   Only take medicine as told by your doctor.  GET HELP RIGHT AWAY IF:    Your pain gets worse.   You have very bad headaches.   You are sick to your stomach (nauseous).   You throw up (vomit).   You are very sleepy (drowsy) all the time.   Your face is puffy (swollen).   Your vision changes.   You have a stiff neck.   You have trouble breathing.  MAKE SURE YOU:    Understand these instructions.   Will watch your condition.   Will get help right away if you are not doing well or get worse.     This information is not intended to replace advice given to you by your health care provider. Make sure you discuss any questions you have with your health care provider.     Document Released: 12/21/2007 Document Revised: 07/25/2014 Document Reviewed: 02/07/2012  Elsevier Interactive Patient Education 2016 [employer]

## 2015-11-02 ENCOUNTER — Encounter: Payer: Self-pay | Admitting: *Deleted

## 2015-11-02 ENCOUNTER — Emergency Department
Admission: EM | Admit: 2015-11-02 | Discharge: 2015-11-02 | Disposition: A | Discharge status: Home / Self Care | Payer: Self-pay | Attending: Emergency Medicine | Admitting: Emergency Medicine

## 2015-11-02 DIAGNOSIS — I059 Rheumatic mitral valve disease, unspecified: Secondary | ICD-10-CM | POA: Insufficient documentation

## 2015-11-02 DIAGNOSIS — I2510 Atherosclerotic heart disease of native coronary artery without angina pectoris: Secondary | ICD-10-CM | POA: Insufficient documentation

## 2015-11-02 DIAGNOSIS — Z8673 Personal history of transient ischemic attack (TIA), and cerebral infarction without residual deficits: Secondary | ICD-10-CM | POA: Insufficient documentation

## 2015-11-02 DIAGNOSIS — E785 Hyperlipidemia, unspecified: Secondary | ICD-10-CM | POA: Insufficient documentation

## 2015-11-02 DIAGNOSIS — L562 Photocontact dermatitis [berloque dermatitis]: Secondary | ICD-10-CM | POA: Insufficient documentation

## 2015-11-02 DIAGNOSIS — I10 Essential (primary) hypertension: Secondary | ICD-10-CM | POA: Insufficient documentation

## 2015-11-02 DIAGNOSIS — K219 Gastro-esophageal reflux disease without esophagitis: Secondary | ICD-10-CM | POA: Insufficient documentation

## 2015-11-02 DIAGNOSIS — Z7951 Long term (current) use of inhaled steroids: Secondary | ICD-10-CM | POA: Insufficient documentation

## 2015-11-02 DIAGNOSIS — W899XXA Exposure to unspecified man-made visible and ultraviolet light, initial encounter: Secondary | ICD-10-CM | POA: Insufficient documentation

## 2015-11-02 DIAGNOSIS — Z79899 Other long term (current) drug therapy: Secondary | ICD-10-CM | POA: Insufficient documentation

## 2015-11-02 DIAGNOSIS — L568 Other specified acute skin changes due to ultraviolet radiation: Secondary | ICD-10-CM

## 2015-11-02 NOTE — ED Notes (Signed)
Pt to triage via wheelchair.  Pt reports swelling and knee stinging to both knees today.  Both knees are red.  No known injury. Pt states she went to work today and came home and noticed the redness.   Denies leg pain.  No sob.  Pt alert

## 2015-11-02 NOTE — ED Notes (Signed)
Discharge instructions reviewed with patient. Patient verbalized understanding. Patient taken to lobby via wheelchair.

## 2015-11-02 NOTE — ED Provider Notes (Signed)
[facility] Emergency Department Provider Note ____________________________________________  Time seen: 2239  I have reviewed the triage vital signs and the nursing notes.  HISTORY  Chief Complaint  Joint Swelling  HPI Destiny [name] is a 60 y.o. female presents to the ED for evaluation of redness to the skin over her knees bilaterally. She also describes intermittent stinging type sensation to the skin of the knees as well. She denies any injury, trauma, accident, or allergic contact. She denies any significant knee pain, or skin itching and irritation. 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 When she got home from work and change close, she noted scattered redness over the knees bilaterally. She denies a rash to the area and denies any known exposures to any chemicals or irritants. She otherwise denies any fevers, chills, sweats, distal paresthesias, or lower extremity swelling beyond her baseline. Patient presents to the ED today for skin changes to the skin overlying the knees bilaterally.  Past Medical History  Diagnosis Date  . Sleep apnea   . Stroke ([place])     x3  . GERD (gastroesophageal reflux disease)   . CAD (coronary artery disease)   . Mitral valve disease   . Hypertension   . CVA (cerebral infarction)   . Hyperlipidemia     Patient Active Problem List   Diagnosis Date Noted  . Small bowel obstruction ([place]) 06/16/2015    Past Surgical History  Procedure Laterality Date  . Tonsillectomy    . Cholecystectomy    . Colon surgery    . Carpal tunnel release Bilateral   . Tennis elbow release/nirschel procedure Left     Current Outpatient Rx  Name  Route  Sig  Dispense  Refill  . acetaminophen (TYLENOL) 500 MG tablet   Oral   Take 1,000 mg by mouth every 6 (six) hours as needed for mild pain or moderate pain.         Marland Kitchen amoxicillin (AMOXIL) 500 MG capsule   Oral   Take 1 capsule  (500 mg total) by mouth 3 (three) times daily.   30 capsule   0   . benzonatate (TESSALON PERLES) 100 MG capsule   Oral   Take 1 capsule (100 mg total) by mouth 3 (three) times daily as needed for cough (Take 1-2 per dose).   30 capsule   0   . brompheniramine-pseudoephedrine-DM 30-2-10 MG/5ML syrup   Oral   Take 5 mLs by mouth 4 (four) times daily as needed.   120 mL   0   . cetirizine (ZYRTEC) 10 MG tablet   Oral   Take 10 mg by mouth every morning.         . Cholecalciferol (VITAMIN D3) 5000 UNITS CAPS   Oral   Take 5,000 Units by mouth daily.         . clonazePAM (KLONOPIN) 0.5 MG tablet   Oral   Take 0.5 mg by mouth 2 (two) times daily.         . clopidogrel (PLAVIX) 75 MG tablet   Oral   Take 75 mg by mouth daily.         Marland Kitchen conjugated estrogens (PREMARIN) vaginal cream   Vaginal   Place 1 Applicatorful vaginally 3 (three) times a week. At bedtime         . esomeprazole (NEXIUM) 40 MG capsule   Oral   Take 40 mg by mouth 2 (  two) times daily.         . fluconazole (DIFLUCAN) 150 MG tablet   Oral   Take 1 tablet (150 mg total) by mouth daily.   2 tablet   0   . fluticasone (FLONASE) 50 MCG/ACT nasal spray   Each Nare   Place 1 spray into both nostrils daily.   16 g   0   . furosemide (LASIX) 20 MG tablet   Oral   Take 40 mg by mouth every morning.         . gabapentin (NEURONTIN) 600 MG tablet   Oral   Take 300-1,500 mg by mouth See admin instructions. 300 mg every morning, 300 mg at lunch, and 1500 mg at bedtime         . hydrocortisone cream 0.5 %   Topical   Apply 1 application topically 2 (two) times daily.         . hydrOXYzine (ATARAX/VISTARIL) 50 MG tablet   Oral   Take 50 mg by mouth 3 (three) times daily as needed for anxiety or itching.         . lovastatin (MEVACOR) 40 MG tablet   Oral   Take 40 mg by mouth at bedtime.         . naproxen (NAPROSYN) 500 MG tablet   Oral   Take 500 mg by mouth 2 (two) times  daily as needed for mild pain or moderate pain.         Marland Kitchen nystatin cream (MYCOSTATIN)   Topical   Apply 1 application topically 4 (four) times daily.         . potassium chloride SA (K-DUR,KLOR-CON) 20 MEQ tablet   Oral   Take 20 mEq by mouth daily.         . sertraline (ZOLOFT) 100 MG tablet   Oral   Take 100 mg by mouth at bedtime.         . triamcinolone cream (KENALOG) 0.1 %   Topical   Apply 1 application topically 2 (two) times daily as needed (for inflammation).          Allergies Atorvastatin and Diflucan  Family History  Problem Relation Age of Onset  . Heart disease Other   . Hypertension Other     Social History Social History  Substance Use Topics  . Smoking status: Never Smoker   . Smokeless tobacco: None  . Alcohol Use: No   Review of Systems  Constitutional: Negative for fever. Cardiovascular: Negative for chest pain. Respiratory: Negative for shortness of breath. Gastrointestinal: Negative for abdominal pain, vomiting and diarrhea. Musculoskeletal: Negative for back pain. Skin: Negative for rash. Skin changes as above. Neurological: Negative for headaches, focal weakness or numbness. ____________________________________________  PHYSICAL EXAM:  VITAL SIGNS: ED Triage Vitals  Enc Vitals Group     BP 11/02/15 2204 100/65 mmHg     Pulse Rate 11/02/15 2204 76     Resp 11/02/15 2204 20     Temp 11/02/15 2204 98.5 F (36.9 C)     Temp Source 11/02/15 2204 Oral     SpO2 11/02/15 2204 99 %     Weight 11/02/15 2204 210 lb (95.255 kg)     Height 11/02/15 2204 5\' 2"  (1.575 m)     Head Cir --      Peak Flow --      Pain Score 11/02/15 2206 8     Pain Loc --      Pain Edu? --  Excl. in [place]? --    Constitutional: Alert and oriented. Well appearing and in no distress. Head: Normocephalic and atraumatic. Hematological/Lymphatic/Immunological: No cervical lymphadenopathy. Cardiovascular: Normal rate, regular rhythm. Normal distal  pulses. Respiratory: Normal respiratory effort. No wheezes/rales/rhonchi. Gastrointestinal: Soft and nontender. No distention. Musculoskeletal: Patient with normal appearing knee with normal flexion and extension range. Nontender with normal range of motion in all extremities.  Neurologic:  Normal gait without ataxia. Normal speech and language. No gross focal neurologic deficits are appreciated. Skin:  Skin is warm, dry and intact. No rash noted. Patient is noted to have a well demarcated erythema to the bilateral knees. Skin changes are nonpalpable, non-blanchable, nontender macular lesions. The lesions began to appear abruptly just proximal to the knee and end just distal to the patella. The skin is otherwise without induration, warmth, lesions, or signs of infection. There is no phlebitis, or lymphangitis noted. Psychiatric: Mood and affect are normal. Patient exhibits appropriate insight and judgment. ____________________________________________  INITIAL IMPRESSION / ASSESSMENT AND PLAN / ED COURSE  Patient with what appears to be a local photosensitivity reaction unknown drug sensitivity. There is no indication of local infection, cellulitis, ecchymosis, or contact dermatitis. She noted to have a well-demarcated erythematous, non-blanchable, nonpalpable macular skin changes overlying the knees. She is advised that we will manage his with watchful waiting. Patient is encouraged to document the skin changes with her camera over the next few days. She is followed with primary care provider follow-up with management. She return to the ED for any signs of infection, or spread as discussed. ____________________________________________  FINAL CLINICAL IMPRESSION(S) / ED DIAGNOSES  Final diagnoses:  Photosensitivity dermatitis      Melvenia Needles, PA-C 11/03/15 0019  Harvest Dark, MD 11/03/15 2320

## 2016-02-14 ENCOUNTER — Emergency Department: Payer: Self-pay

## 2016-02-14 ENCOUNTER — Encounter: Payer: Self-pay | Admitting: Emergency Medicine

## 2016-02-14 ENCOUNTER — Observation Stay
Admission: EM | Admit: 2016-02-14 | Discharge: 2016-02-15 | Disposition: A | Discharge status: Home / Self Care | Payer: Self-pay | Attending: Internal Medicine | Admitting: Internal Medicine

## 2016-02-14 DIAGNOSIS — R0609 Other forms of dyspnea: Secondary | ICD-10-CM

## 2016-02-14 DIAGNOSIS — I2510 Atherosclerotic heart disease of native coronary artery without angina pectoris: Secondary | ICD-10-CM | POA: Insufficient documentation

## 2016-02-14 DIAGNOSIS — Z888 Allergy status to other drugs, medicaments and biological substances status: Secondary | ICD-10-CM | POA: Insufficient documentation

## 2016-02-14 DIAGNOSIS — Z955 Presence of coronary angioplasty implant and graft: Secondary | ICD-10-CM | POA: Insufficient documentation

## 2016-02-14 DIAGNOSIS — R0789 Other chest pain: Principal | ICD-10-CM | POA: Insufficient documentation

## 2016-02-14 DIAGNOSIS — E785 Hyperlipidemia, unspecified: Secondary | ICD-10-CM | POA: Insufficient documentation

## 2016-02-14 DIAGNOSIS — Z8673 Personal history of transient ischemic attack (TIA), and cerebral infarction without residual deficits: Secondary | ICD-10-CM | POA: Insufficient documentation

## 2016-02-14 DIAGNOSIS — R079 Chest pain, unspecified: Secondary | ICD-10-CM | POA: Diagnosis present

## 2016-02-14 DIAGNOSIS — D649 Anemia, unspecified: Secondary | ICD-10-CM | POA: Insufficient documentation

## 2016-02-14 DIAGNOSIS — Z79899 Other long term (current) drug therapy: Secondary | ICD-10-CM | POA: Insufficient documentation

## 2016-02-14 DIAGNOSIS — Z9049 Acquired absence of other specified parts of digestive tract: Secondary | ICD-10-CM | POA: Insufficient documentation

## 2016-02-14 DIAGNOSIS — K5660 Unspecified intestinal obstruction: Secondary | ICD-10-CM | POA: Insufficient documentation

## 2016-02-14 DIAGNOSIS — I10 Essential (primary) hypertension: Secondary | ICD-10-CM | POA: Insufficient documentation

## 2016-02-14 DIAGNOSIS — K219 Gastro-esophageal reflux disease without esophagitis: Secondary | ICD-10-CM | POA: Insufficient documentation

## 2016-02-14 DIAGNOSIS — Z7982 Long term (current) use of aspirin: Secondary | ICD-10-CM | POA: Insufficient documentation

## 2016-02-14 DIAGNOSIS — R55 Syncope and collapse: Secondary | ICD-10-CM

## 2016-02-14 DIAGNOSIS — I081 Rheumatic disorders of both mitral and tricuspid valves: Secondary | ICD-10-CM | POA: Insufficient documentation

## 2016-02-14 DIAGNOSIS — G4730 Sleep apnea, unspecified: Secondary | ICD-10-CM | POA: Insufficient documentation

## 2016-02-14 DIAGNOSIS — Z8249 Family history of ischemic heart disease and other diseases of the circulatory system: Secondary | ICD-10-CM | POA: Insufficient documentation

## 2016-02-14 LAB — BRAIN NATRIURETIC PEPTIDE: B Natriuretic Peptide: 29 pg/mL (ref 0.0–100.0)

## 2016-02-14 LAB — BASIC METABOLIC PANEL
Anion gap: 9 (ref 5–15)
BUN: 13 mg/dL (ref 6–20)
CO2: 26 mmol/L (ref 22–32)
CREATININE: 0.92 mg/dL (ref 0.44–1.00)
Calcium: 8.8 mg/dL — ABNORMAL LOW (ref 8.9–10.3)
Chloride: 104 mmol/L (ref 101–111)
Glucose, Bld: 107 mg/dL — ABNORMAL HIGH (ref 65–99)
POTASSIUM: 3.9 mmol/L (ref 3.5–5.1)
SODIUM: 139 mmol/L (ref 135–145)

## 2016-02-14 LAB — CBC
HCT: 25.2 % — ABNORMAL LOW (ref 35.0–47.0)
Hemoglobin: 8.2 g/dL — ABNORMAL LOW (ref 12.0–16.0)
MCH: 24.5 pg — ABNORMAL LOW (ref 26.0–34.0)
MCHC: 32.4 g/dL (ref 32.0–36.0)
MCV: 75.7 fL — ABNORMAL LOW (ref 80.0–100.0)
PLATELETS: 214 10*3/uL (ref 150–440)
RBC: 3.33 MIL/uL — AB (ref 3.80–5.20)
RDW: 17.8 % — AB (ref 11.5–14.5)
WBC: 6.5 10*3/uL (ref 3.6–11.0)

## 2016-02-14 LAB — IRON AND TIBC
IRON: 34 ug/dL (ref 28–170)
Saturation Ratios: 7 % — ABNORMAL LOW (ref 10.4–31.8)
TIBC: 482 ug/dL — ABNORMAL HIGH (ref 250–450)
UIBC: 448 ug/dL

## 2016-02-14 LAB — FERRITIN: FERRITIN: 9 ng/mL — AB (ref 11–307)

## 2016-02-14 LAB — URINALYSIS COMPLETE WITH MICROSCOPIC (ARMC ONLY)
BILIRUBIN URINE: NEGATIVE
Bacteria, UA: NONE SEEN
GLUCOSE, UA: NEGATIVE mg/dL
Hgb urine dipstick: NEGATIVE
KETONES UR: NEGATIVE mg/dL
NITRITE: NEGATIVE
Protein, ur: NEGATIVE mg/dL
SPECIFIC GRAVITY, URINE: 1.006 (ref 1.005–1.030)
pH: 6 (ref 5.0–8.0)

## 2016-02-14 LAB — TROPONIN I
Troponin I: <0.03 ng/mL (ref <0.03)
Troponin I: <0.03 ng/mL (ref <0.03)

## 2016-02-14 LAB — TSH: TSH: 0.618 u[IU]/mL (ref 0.350–4.500)

## 2016-02-14 MED ORDER — ACETAMINOPHEN 500 MG PO TABS
1000.0000 mg | ORAL_TABLET | Freq: Four times a day (QID) | ORAL | Status: DC | PRN
  Administered 2016-02-15: 1000 mg via ORAL
  Filled 2016-02-14: qty 2

## 2016-02-14 MED ORDER — SODIUM CHLORIDE 0.9% FLUSH
3.0000 mL | Freq: Two times a day (BID) | INTRAVENOUS | Status: DC
  Administered 2016-02-14: 3 mL via INTRAVENOUS

## 2016-02-14 MED ORDER — GABAPENTIN 600 MG PO TABS: 300.0000 mg | ORAL_TABLET | ORAL | Status: DC

## 2016-02-14 MED ORDER — FLUTICASONE PROPIONATE 50 MCG/ACT NA SUSP
1.0000 | Freq: Every day | NASAL | Status: DC
  Filled 2016-02-14: qty 16

## 2016-02-14 MED ORDER — METOPROLOL TARTRATE 25 MG PO TABS
12.5000 mg | ORAL_TABLET | Freq: Two times a day (BID) | ORAL | Status: DC
  Administered 2016-02-15: 12.5 mg via ORAL
  Filled 2016-02-14: qty 1

## 2016-02-14 MED ORDER — LORATADINE 10 MG PO TABS
10.0000 mg | ORAL_TABLET | Freq: Every day | ORAL | Status: DC
  Administered 2016-02-15: 10 mg via ORAL
  Filled 2016-02-14: qty 1

## 2016-02-14 MED ORDER — HYDROXYZINE HCL 25 MG PO TABS
50.0000 mg | ORAL_TABLET | Freq: Three times a day (TID) | ORAL | Status: DC | PRN
  Filled 2016-02-14: qty 2

## 2016-02-14 MED ORDER — GABAPENTIN 300 MG PO CAPS
1500.0000 mg | ORAL_CAPSULE | Freq: Every day | ORAL | Status: DC
  Administered 2016-02-14: 1500 mg via ORAL
  Filled 2016-02-14: qty 5

## 2016-02-14 MED ORDER — ROSUVASTATIN CALCIUM 20 MG PO TABS
20.0000 mg | ORAL_TABLET | Freq: Every day | ORAL | Status: DC
  Administered 2016-02-14 – 2016-02-15 (×2): 20 mg via ORAL
  Filled 2016-02-14 (×2): qty 1

## 2016-02-14 MED ORDER — GABAPENTIN 300 MG PO CAPS
300.0000 mg | ORAL_CAPSULE | Freq: Every day | ORAL | Status: DC
  Filled 2016-02-14: qty 1

## 2016-02-14 MED ORDER — TICAGRELOR 90 MG PO TABS
90.0000 mg | ORAL_TABLET | Freq: Two times a day (BID) | ORAL | Status: DC
  Administered 2016-02-14 – 2016-02-15 (×2): 90 mg via ORAL
  Filled 2016-02-14 (×2): qty 1

## 2016-02-14 MED ORDER — VITAMIN D 1000 UNITS PO TABS
5000.0000 [IU] | ORAL_TABLET | Freq: Every day | ORAL | Status: DC
  Administered 2016-02-15: 5000 [IU] via ORAL
  Filled 2016-02-14: qty 5

## 2016-02-14 MED ORDER — PANTOPRAZOLE SODIUM 40 MG PO TBEC
40.0000 mg | DELAYED_RELEASE_TABLET | Freq: Two times a day (BID) | ORAL | Status: DC
  Administered 2016-02-14 – 2016-02-15 (×2): 40 mg via ORAL
  Filled 2016-02-14 (×2): qty 1

## 2016-02-14 MED ORDER — ENOXAPARIN SODIUM 40 MG/0.4ML ~~LOC~~ SOLN
40.0000 mg | Freq: Two times a day (BID) | SUBCUTANEOUS | Status: DC
  Administered 2016-02-14 – 2016-02-15 (×2): 40 mg via SUBCUTANEOUS
  Filled 2016-02-14 (×2): qty 0.4

## 2016-02-14 MED ORDER — PANTOPRAZOLE SODIUM 40 MG PO TBEC: 40.0000 mg | DELAYED_RELEASE_TABLET | Freq: Every day | ORAL | Status: DC

## 2016-02-14 NOTE — Progress Notes (Signed)
Order for enoxaparin 40 mg subcutaneously daily was changed to enoxaparin 40 mg BID per anticoagulation protocol for CrCl > 30 mL/min and BMI > 40.  Lenis Noon, PharmD 02/14/16

## 2016-02-14 NOTE — H&P (Signed)
[facility] [place] NAME: Destiny [name]    MR#:  [id number]  DATE OF BIRTH:  08/08/1955  DATE OF ADMISSION:  02/14/2016  PRIMARY CARE PHYSICIAN: Baltazar Apo, MD   REQUESTING/REFERRING PHYSICIAN: Chrys Racer MD  CHIEF COMPLAINT:   Chief Complaint  Patient presents with  . Loss of Consciousness    HISTORY OF PRESENT ILLNESS: Destiny [name]  is a 60 y.o. female with a known history of Coronary artery disease with previous stents in the past who states that she's been having chest pain for the past 1 week. She describes it as a left-sided chest pressure especially with exertion. She also complains of dyspnea on exertion. She did not seek any medical attention. Today she went to take a shower and then she sat down because she was not feeling well. At that point she had a episode of shaking according to the husband lasting 4-5 minutes. With her eyes rolling on the back of her head. He describes her whole body being tremulous. After that shaking stopped she was not completely lucid for 4-5 minutes. This when EMS was called. When EMS arrived her blood pressure was noted to be in the 80s. She complains of chronic lower extremity swelling. Especially on the right that has been evaluated in the past with a Doppler that was negative.  PAST MEDICAL HISTORY:   Past Medical History:  Diagnosis Date  . CAD (coronary artery disease)   . CVA (cerebral infarction)   . GERD (gastroesophageal reflux disease)   . Hyperlipidemia   . Hypertension   . Mitral valve disease   . Sleep apnea   . Stroke [facility])    x3    PAST SURGICAL HISTORY: Past Surgical History:  Procedure Laterality Date  . CARPAL TUNNEL RELEASE Bilateral   . CHOLECYSTECTOMY    . COLON SURGERY    . TENNIS ELBOW RELEASE/NIRSCHEL PROCEDURE Left   . TONSILLECTOMY      SOCIAL HISTORY:  Social History  Substance Use Topics  . Smoking status: Never Smoker  . Smokeless tobacco: Never Used   . Alcohol use No    FAMILY HISTORY:  Family History  Problem Relation Age of Onset  . Heart disease Other   . Hypertension Other     DRUG ALLERGIES:  Allergies  Allergen Reactions  . Atorvastatin Hives    No s/sx of anaphylaxis, just intermittent hives.  Does not seem to be a class effect as she has tolerated lovastatin previously  . Diflucan [Fluconazole] Rash    REVIEW OF SYSTEMS:   CONSTITUTIONAL: No fever, fatigue or weakness.  EYES: No blurred or double vision.  EARS, NOSE, AND THROAT: No tinnitus or ear pain.  RESPIRATORY: No cough,Positive shortness of breath, no wheezing or hemoptysis.  CARDIOVASCULAR: Positive chest pain, orthopnea, edema. Positive syncope GASTROINTESTINAL: No nausea, vomiting, diarrhea or abdominal pain.  GENITOURINARY: No dysuria, hematuria.  ENDOCRINE: No polyuria, nocturia,  HEMATOLOGY: No anemia, easy bruising or bleeding SKIN: No rash or lesion. MUSCULOSKELETAL: No joint pain or arthritis.   NEUROLOGIC: No tingling, numbness, weakness.  PSYCHIATRY: No anxiety or depression.   MEDICATIONS AT HOME:  Prior to Admission medications   Medication Sig Start Date End Date Taking? Authorizing Provider  acetaminophen (TYLENOL) 500 MG tablet Take 1,000 mg by mouth every 6 (six) hours as needed for mild pain or moderate pain.   Yes Historical Provider, MD  aspirin EC 81 MG tablet Take 81 mg by mouth daily.  Yes Historical Provider, MD  cetirizine (ZYRTEC) 10 MG tablet Take 10 mg by mouth every morning.   Yes Historical Provider, MD  Cholecalciferol (VITAMIN D3) 5000 UNITS CAPS Take 5,000 Units by mouth daily.   Yes Historical Provider, MD  conjugated estrogens (PREMARIN) vaginal cream Place 1 Applicatorful vaginally 3 (three) times a week. At bedtime   Yes Historical Provider, MD  diclofenac sodium (VOLTAREN) 1 % GEL Apply 2 g topically 4 (four) times daily as needed.   Yes Historical Provider, MD  esomeprazole (NEXIUM) 40 MG capsule Take 40 mg by  mouth 2 (two) times daily.   Yes Historical Provider, MD  fluticasone (FLONASE) 50 MCG/ACT nasal spray Place 1 spray into both nostrils daily. 07/02/15  Yes Jenise V Bacon Menshew, PA-C  furosemide (LASIX) 20 MG tablet Take 20 mg by mouth every morning.    Yes Historical Provider, MD  gabapentin (NEURONTIN) 600 MG tablet Take 300-1,500 mg by mouth See admin instructions. 300 mg every morning, 300 mg at lunch, and 1500 mg at bedtime   Yes Historical Provider, MD  hydrocortisone cream 0.5 % Apply 1 application topically 2 (two) times daily.   Yes Historical Provider, MD  hydrOXYzine (ATARAX/VISTARIL) 50 MG tablet Take 50 mg by mouth 3 (three) times daily as needed for anxiety or itching.   Yes Historical Provider, MD  lisinopril (PRINIVIL,ZESTRIL) 5 MG tablet Take 5 mg by mouth daily.   Yes Historical Provider, MD  metoprolol tartrate (LOPRESSOR) 25 MG tablet Take 25 mg by mouth 2 (two) times daily.   Yes Historical Provider, MD  nitroGLYCERIN (NITROSTAT) 0.4 MG SL tablet Place 0.4 mg under the tongue every 5 (five) minutes as needed for chest pain.   Yes Historical Provider, MD  nystatin cream (MYCOSTATIN) Apply 1 application topically 4 (four) times daily.   Yes Historical Provider, MD  potassium chloride SA (K-DUR,KLOR-CON) 20 MEQ tablet Take 20 mEq by mouth daily.   Yes Historical Provider, MD  rosuvastatin (CRESTOR) 20 MG tablet Take 20 mg by mouth daily.   Yes Historical Provider, MD  ticagrelor (BRILINTA) 90 MG TABS tablet Take 90 mg by mouth 2 (two) times daily.   Yes Historical Provider, MD  triamcinolone cream (KENALOG) 0.1 % Apply 1 application topically 2 (two) times daily as needed (for inflammation).   Yes Historical Provider, MD      PHYSICAL EXAMINATION:   VITAL SIGNS: Blood pressure 105/74, pulse 71, temperature 98.2 F (36.8 C), temperature source Oral, resp. rate 19, height 5\' 2"  (1.575 m), weight 106.5 kg (234 lb 14.4 oz), SpO2 100 %.  GENERAL:  60 y.o.-year-old patient lying  in the bed with no acute distress.  EYES: Pupils equal, round, reactive to light and accommodation. No scleral icterus. Extraocular muscles intact.  HEENT: Head atraumatic, normocephalic. Oropharynx and nasopharynx clear.  NECK:  Supple, no jugular venous distention. No thyroid enlargement, no tenderness.  LUNGS: Normal breath sounds bilaterally, no wheezing, rales,rhonchi or crepitation. No use of accessory muscles of respiration.  CARDIOVASCULAR: S1, S2 normal. No murmurs, rubs, or gallops.  ABDOMEN: Soft, nontender, nondistended. Bowel sounds present. No organomegaly or mass.  EXTREMITIES: No pedal edema, cyanosis, or clubbing.  NEUROLOGIC: Cranial nerves II through XII are intact. Muscle strength 5/5 in all extremities. Sensation intact. Gait not checked.  PSYCHIATRIC: The patient is alert and oriented x 3.  SKIN: No obvious rash, lesion, or ulcer.   LABORATORY PANEL:   CBC  Recent Labs Lab 02/14/16 1543  WBC 6.5  HGB  8.2*  HCT 25.2*  PLT 214  MCV 75.7*  MCH 24.5*  MCHC 32.4  RDW 17.8*   ------------------------------------------------------------------------------------------------------------------  Chemistries   Recent Labs Lab 02/14/16 1543  NA 139  K 3.9  CL 104  CO2 26  GLUCOSE 107*  BUN 13  CREATININE 0.92  CALCIUM 8.8*   ------------------------------------------------------------------------------------------------------------------ estimated creatinine clearance is 73.7 mL/min (by C-G formula based on SCr of 0.92 mg/dL). ------------------------------------------------------------------------------------------------------------------ No results for input(s): TSH, T4TOTAL, T3FREE, THYROIDAB in the last 72 hours.  Invalid input(s): FREET3   Coagulation profile No results for input(s): INR, PROTIME in the last 168 hours. ------------------------------------------------------------------------------------------------------------------- No results for  input(s): DDIMER in the last 72 hours. -------------------------------------------------------------------------------------------------------------------  Cardiac Enzymes  Recent Labs Lab 02/14/16 1543  TROPONINI <0.03   ------------------------------------------------------------------------------------------------------------------ Invalid input(s): POCBNP  ---------------------------------------------------------------------------------------------------------------  Urinalysis    Component Value Date/Time   COLORURINE YELLOW (A) 06/16/2015 1343   APPEARANCEUR CLEAR (A) 06/16/2015 1343   APPEARANCEUR Cloudy 06/07/2014 0826   LABSPEC 1.047 (H) 06/16/2015 1343   LABSPEC 1.017 06/07/2014 0826   PHURINE 6.0 06/16/2015 1343   GLUCOSEU NEGATIVE 06/16/2015 1343   GLUCOSEU Negative 06/07/2014 0826   HGBUR NEGATIVE 06/16/2015 1343   BILIRUBINUR NEGATIVE 06/16/2015 1343   BILIRUBINUR Negative 06/07/2014 0826   KETONESUR NEGATIVE 06/16/2015 1343   PROTEINUR NEGATIVE 06/16/2015 1343   NITRITE NEGATIVE 06/16/2015 1343   LEUKOCYTESUR NEGATIVE 06/16/2015 1343   LEUKOCYTESUR 3+ 06/07/2014 0826     RADIOLOGY: Dg Chest 2 View  Result Date: 02/14/2016 CLINICAL DATA:  Recent syncopal episode EXAM: CHEST  2 VIEW COMPARISON:  06/16/2015 FINDINGS: The heart size and mediastinal contours are within normal limits. Both lungs are clear. The visualized skeletal structures are unremarkable. IMPRESSION: No active cardiopulmonary disease. Electronically Signed   By: Inez Catalina M.D.   On: 02/14/2016 16:56   EKG: Orders placed or performed during the hospital encounter of 02/14/16  . EKG 12-Lead  . EKG 12-Lead  . ED EKG  . ED EKG    IMPRESSION AND PLAN: Patient is a 60 year old with coronary artery disease presenting with complaint of having chest pain and syncope  1. Chest pain concerning for angina Currently EKG cardiac enzymes are negative Continue aspirin lower dose metoprolol in  light of hypotension initially Will hold ACE inhibitor for now Her husband sees [facility] cardiology request their evaluation Patient may need cardiac catheterization  2. Loss of consciousness Certainly could be due to syncope related to hypotension Seizures not ruled out At this point will monitor blood pressure Monitor on telemetry Echocardiogram of the heart Carotid Dopplers Neurology consult for concern for seizure  3. Hypertension I will hold her antihypertensives due to hypotension on presentation monitor blood pressure  4. Anemia appears to be microcytic Guaiac stools Check iron and ferritin levels  5. Sleep apnea continue CPAP at bedtime  6. Hyperlipidemia continue Crestor lipid panel in the a.m.  7. Miscellaneous Lovenox for DVT prophylaxis      All the records are reviewed and case discussed with ED provider. Management plans discussed with the patient, family and they are in agreement.  CODE STATUS: Code Status History    Date Active Date Inactive Code Status Order ID Comments User Context   06/16/2015  5:53 PM 06/19/2015  6:22 PM Full Code [id number]  Clayburn Pert, MD Inpatient       TOTAL TIME TAKING CARE OF THIS PATIENT: 55 minutes.    Dustin Flock M.D on 02/14/2016 at 5:11 PM  Between 7am to 6pm -  Pager - [phone]  After 6pm go to [email or web address] - password [email or web address] [facility]  Office  [phone]  CC: Primary care physician; Baltazar Apo, MD

## 2016-02-14 NOTE — ED Triage Notes (Signed)
Patient brought in by [facility] from home for syncopal episode. Patient reports taking a "very hot shower" after the shower she was walking to her bed and felt lightheaded so she sat down. Patient husband witnessed patient pass out onto the bed and told EMS she was out for about 1 minute. EMS reports initial BP of 80/40, patient was given 500 mL Normal Saline Bolus with BP coming up to 106/ 66

## 2016-02-14 NOTE — ED Notes (Signed)
Patient husband states that patient has been complaining of chest pain the past few days. Patient admits that she has had chest pain but is not currently experiencing any chest pain at this time

## 2016-02-14 NOTE — ED Provider Notes (Signed)
[facility] Emergency Department Provider Note  ____________________________________________  Time seen: Approximately 4:09 PM  I have reviewed the triage vital signs and the nursing notes.   HISTORY  Chief Complaint Loss of Consciousness   HPI Destiny [name] is a 60 y.o. female h/o CAD s/p DES to LAD in September 2016 on [place], CVA, HTN, HLD, anemia who presents for evaluation of a syncopal episode. Patient reports that she took a warm shower and was walking back to her room and she felt she was going to pass out. She sat on her bed and had a syncopal episode lasting a few minutes. Her husband witnessed the episode. The patient regained consciousness she was back to her baseline with no postictal, no urinary or bowel incontinence. Patient denies any prior history of syncope. Patient does endorse for the last week she has been having progressively worsening dyspnea on exertion and chest pain. She reports that her chest pain and shortness of breath happen with mild exertion. The chest pain is pressure, located substernally, nonradiating, associated with lightheadedness and shortness of breath. Those episodes resolve the when she sits down for a few minutes. Patient hasn't had a stress test or a left heart catheter since her stents were placed in September 2016. She reports her symptoms are similar to when she had had the stents placed. She denies any current chest pain or shortness of breath or symptoms of chest pain, shortness of breath, palpitations leading to her syncope.  Past Medical History:  Diagnosis Date  . CAD (coronary artery disease)   . CVA (cerebral infarction)   . GERD (gastroesophageal reflux disease)   . Hyperlipidemia   . Hypertension   . Mitral valve disease   . Sleep apnea   . Stroke [facility])    x3    Patient Active Problem List   Diagnosis Date Noted  . Chest pain 02/14/2016  . Small bowel obstruction ([place]) 06/16/2015    Past  Surgical History:  Procedure Laterality Date  . CARPAL TUNNEL RELEASE Bilateral   . CHOLECYSTECTOMY    . COLON SURGERY    . TENNIS ELBOW RELEASE/NIRSCHEL PROCEDURE Left   . TONSILLECTOMY      Prior to Admission medications   Medication Sig Start Date End Date Taking? Authorizing Provider  acetaminophen (TYLENOL) 500 MG tablet Take 1,000 mg by mouth every 6 (six) hours as needed for mild pain or moderate pain.   Yes Historical Provider, MD  aspirin EC 81 MG tablet Take 81 mg by mouth daily.   Yes Historical Provider, MD  cetirizine (ZYRTEC) 10 MG tablet Take 10 mg by mouth every morning.   Yes Historical Provider, MD  Cholecalciferol (VITAMIN D3) 5000 UNITS CAPS Take 5,000 Units by mouth daily.   Yes Historical Provider, MD  conjugated estrogens (PREMARIN) vaginal cream Place 1 Applicatorful vaginally 3 (three) times a week. At bedtime   Yes Historical Provider, MD  diclofenac sodium (VOLTAREN) 1 % GEL Apply 2 g topically 4 (four) times daily as needed.   Yes Historical Provider, MD  esomeprazole (NEXIUM) 40 MG capsule Take 40 mg by mouth 2 (two) times daily.   Yes Historical Provider, MD  fluticasone (FLONASE) 50 MCG/ACT nasal spray Place 1 spray into both nostrils daily. 07/02/15  Yes Jenise V Bacon Menshew, PA-C  furosemide (LASIX) 20 MG tablet Take 20 mg by mouth every morning.    Yes Historical Provider, MD  gabapentin (NEURONTIN) 600 MG tablet Take 300-1,500 mg by mouth See  admin instructions. 300 mg every morning, 300 mg at lunch, and 1500 mg at bedtime   Yes Historical Provider, MD  hydrocortisone cream 0.5 % Apply 1 application topically 2 (two) times daily.   Yes Historical Provider, MD  hydrOXYzine (ATARAX/VISTARIL) 50 MG tablet Take 50 mg by mouth 3 (three) times daily as needed for anxiety or itching.   Yes Historical Provider, MD  lisinopril (PRINIVIL,ZESTRIL) 5 MG tablet Take 5 mg by mouth daily.   Yes Historical Provider, MD  metoprolol tartrate (LOPRESSOR) 25 MG tablet Take 25  mg by mouth 2 (two) times daily.   Yes Historical Provider, MD  nitroGLYCERIN (NITROSTAT) 0.4 MG SL tablet Place 0.4 mg under the tongue every 5 (five) minutes as needed for chest pain.   Yes Historical Provider, MD  nystatin cream (MYCOSTATIN) Apply 1 application topically 4 (four) times daily.   Yes Historical Provider, MD  potassium chloride SA (K-DUR,KLOR-CON) 20 MEQ tablet Take 20 mEq by mouth daily.   Yes Historical Provider, MD  rosuvastatin (CRESTOR) 20 MG tablet Take 20 mg by mouth daily.   Yes Historical Provider, MD  ticagrelor (BRILINTA) 90 MG TABS tablet Take 90 mg by mouth 2 (two) times daily.   Yes Historical Provider, MD  triamcinolone cream (KENALOG) 0.1 % Apply 1 application topically 2 (two) times daily as needed (for inflammation).   Yes Historical Provider, MD    Allergies Atorvastatin and Diflucan [fluconazole]  Family History  Problem Relation Age of Onset  . Heart disease Other   . Hypertension Other     Social History Social History  Substance Use Topics  . Smoking status: Never Smoker  . Smokeless tobacco: Never Used  . Alcohol use No    Review of Systems  Constitutional: Negative for fever. + syncope Eyes: Negative for visual changes. ENT: Negative for sore throat. Cardiovascular: + chest pain. Respiratory: +shortness of breath. Gastrointestinal: Negative for abdominal pain, vomiting or diarrhea. Genitourinary: Negative for dysuria. Musculoskeletal: Negative for back pain. Skin: Negative for rash. Neurological: Negative for headaches, weakness or numbness.  ____________________________________________   PHYSICAL EXAM:  VITAL SIGNS: ED Triage Vitals [02/14/16 1532]  Enc Vitals Group     BP 91/75     Pulse Rate 75     Resp 18     Temp 98.2 F (36.8 C)     Temp Source Oral     SpO2 100 %     Weight 234 lb 14.4 oz (106.5 kg)     Height 5\' 2"  (1.575 m)     Head Circumference      Peak Flow      Pain Score      Pain Loc      Pain Edu?        Excl. in [place]?     Constitutional: Alert and oriented. Well appearing and in no apparent distress. HEENT:      Head: Normocephalic and atraumatic.         Eyes: Conjunctivae are normal. Sclera is non-icteric. EOMI. PERRL      Mouth/Throat: Mucous membranes are moist.       Neck: Supple with no signs of meningismus. Cardiovascular: Regular rate and rhythm. No murmurs, gallops, or rubs. 2+ symmetrical distal pulses are present in all extremities. No JVD. Respiratory: Normal respiratory effort. Lungs are clear to auscultation bilaterally. No wheezes, crackles, or rhonchi.  Gastrointestinal: Soft, non tender, and non distended with positive bowel sounds. No rebound or guarding. Genitourinary: No CVA tenderness. Musculoskeletal: Nontender  with normal range of motion in all extremities. No edema, cyanosis, or erythema of extremities. Neurologic: A & O x3, PERRL, no nystagmus, CN II-XII intact, motor testing reveals good tone and bulk throughout. There is no evidence of pronator drift or dysmetria. Muscle strength is 5/5 throughout. Deep tendon reflexes are 2+ throughout with downgoing toes. Sensory examination is intact. Gait is normal. Skin: Skin is warm, dry and intact. No rash noted. Psychiatric: Mood and affect are normal. Speech and behavior are normal.  ____________________________________________   LABS (all labs ordered are listed, but only abnormal results are displayed)  Labs Reviewed  BASIC METABOLIC PANEL - Abnormal; Notable for the following:       Result Value   Glucose, Bld 107 (*)    Calcium 8.8 (*)    All other components within normal limits  CBC - Abnormal; Notable for the following:    RBC 3.33 (*)    Hemoglobin 8.2 (*)    HCT 25.2 (*)    MCV 75.7 (*)    MCH 24.5 (*)    RDW 17.8 (*)    All other components within normal limits  URINALYSIS COMPLETEWITH MICROSCOPIC ([facility] ONLY) - Abnormal; Notable for the following:    Color, Urine STRAW (*)    APPearance CLEAR  (*)    Leukocytes, UA 1+ (*)    Squamous Epithelial / LPF 0-5 (*)    All other components within normal limits  IRON AND TIBC - Abnormal; Notable for the following:    TIBC 482 (*)    Saturation Ratios 7 (*)    All other components within normal limits  FERRITIN - Abnormal; Notable for the following:    Ferritin 9 (*)    All other components within normal limits  TROPONIN I  BRAIN NATRIURETIC PEPTIDE  TSH  TROPONIN I  TROPONIN I  OCCULT BLOOD X 1 CARD TO LAB, STOOL  LIPID PANEL  CBG MONITORING, ED   ____________________________________________  EKG  ED ECG REPORT I, Rudene Re, the attending physician, personally viewed and interpreted this ECG.  Normal sinus rhythm, rate of 75, normal intervals, normal axis, no ST elevations or depressions. ____________________________________________  RADIOLOGY  CXR: negative ____________________________________________   PROCEDURES  Procedure(s) performed: None Procedures Critical Care performed:  None ____________________________________________   INITIAL IMPRESSION / ASSESSMENT AND PLAN / ED COURSE  60 y.o. female h/o CAD s/p DES to LAD in September 2016 on [place], CVA, HTN, HLD, anemia who presents for evaluation of a syncopal episode after a hot shower which could be due to vasovagal, however patient with worsening DOE and CP over the course of 1 week which makes me concerned for unstable angina. Patient with significant disease on LHC one year ago. EKG with no evidence of ischemia. Troponin x1 negative. CXR and BNP pending. Will admit for further evaluation.  Clinical Course    Pertinent labs & imaging results that were available during my care of the patient were reviewed by me and considered in my medical decision making (see chart for details).    ____________________________________________   FINAL CLINICAL IMPRESSION(S) / ED DIAGNOSES  Final diagnoses:  Syncope, unspecified syncope type  DOE  (dyspnea on exertion)      NEW MEDICATIONS STARTED DURING THIS VISIT:  Current Discharge Medication List       Note:  This document was prepared using Dragon voice recognition software and may include unintentional dictation errors.    Rudene Re, MD 02/14/16 [phone]

## 2016-02-14 NOTE — Progress Notes (Signed)
Pt placed on [facility] C-3 CPAP for sleep. CPAP is plugged into red outlet. Pt tolerating well.

## 2016-02-15 ENCOUNTER — Observation Stay: Payer: Self-pay

## 2016-02-15 ENCOUNTER — Observation Stay
Admit: 2016-02-15 | Discharge: 2016-02-15 | Disposition: A | Discharge status: Home / Self Care | Payer: Self-pay | Attending: Internal Medicine | Admitting: Internal Medicine

## 2016-02-15 ENCOUNTER — Encounter: Payer: Self-pay | Admitting: Radiology

## 2016-02-15 LAB — LIPID PANEL
Cholesterol: 93 mg/dL (ref 0–200)
HDL: 42 mg/dL (ref >40)
LDL CALC: 23 mg/dL (ref 0–99)
TRIGLYCERIDES: 142 mg/dL (ref <150)
Total CHOL/HDL Ratio: 2.2 RATIO
VLDL: 28 mg/dL (ref 0–40)

## 2016-02-15 LAB — NM MYOCAR MULTI W/SPECT W/WALL MOTION / EF
CHL CUP MPHR: 159 {beats}/min
CHL CUP NUCLEAR SDS: 0
CHL CUP NUCLEAR SRS: 13
CHL CUP NUCLEAR SSS: 5
CSEPHR: 71 %
CSEPPHR: 107 {beats}/min
Estimated workload: 1 METS
Exercise duration (min): 1 min
Exercise duration (sec): 2 s
LVDIAVOL: 76 mL (ref 46–106)
LVSYSVOL: 31 mL
NUC STRESS TID: 1.07
Rest HR: 81 {beats}/min

## 2016-02-15 LAB — ECHOCARDIOGRAM COMPLETE
HEIGHTINCHES: 62 in
WEIGHTICAEL: 3528 [oz_av]

## 2016-02-15 LAB — TROPONIN I

## 2016-02-15 MED ORDER — PERFLUTREN LIPID MICROSPHERE
1.0000 mL | INTRAVENOUS | Status: AC | PRN
  Filled 2016-02-15: qty 10

## 2016-02-15 MED ORDER — FUROSEMIDE 20 MG PO TABS: 20.0000 mg | ORAL_TABLET | ORAL | Status: DC

## 2016-02-15 MED ORDER — ASPIRIN EC 81 MG PO TBEC
81.0000 mg | DELAYED_RELEASE_TABLET | Freq: Every day | ORAL | Status: DC
  Administered 2016-02-15: 81 mg via ORAL

## 2016-02-15 MED ORDER — METOPROLOL TARTRATE 25 MG PO TABS: 12.5000 mg | ORAL_TABLET | Freq: Two times a day (BID) | ORAL | Status: DC

## 2016-02-15 MED ORDER — TECHNETIUM TC 99M TETROFOSMIN IV KIT
13.3930 | PACK | Freq: Once | INTRAVENOUS | Status: AC | PRN
  Administered 2016-02-15: 13.393 via INTRAVENOUS

## 2016-02-15 NOTE — Progress Notes (Signed)
[place], [place].   02/15/2016  Patient: Destiny [name]   Date of Birth:  1956/05/18  Date of admission:  02/14/2016  Date of Discharge  02/15/2016    To Whom it May Concern:   Destiny [name]  may return to work on 02/16/2016.  If you have any questions or concerns, please don't hesitate to call.  Sincerely,   Hillary Bow R M.D Office : [phone]   .

## 2016-02-15 NOTE — Progress Notes (Signed)
Pt to be discharged this evening. Iv and tele removed. disch instructions given to pt to her understanding. Awaiting transport. Pt to be taken home accompanied by husband

## 2016-02-15 NOTE — Progress Notes (Signed)
*  PRELIMINARY RESULTS* Echocardiogram 2D Echocardiogram has been performed.  Sherrie Sport 02/15/2016, 8:42 AM

## 2016-02-15 NOTE — Care Management (Signed)
Uninsured patient. Spoke with patient. She is alert, independent and active. PCP is at [facility]. She gets her medications at [place] [facility] [address]. Not interested in medication assistance from Medication Management. No DME or home health. She denies issues obtaining transportation. No needs identified.

## 2016-02-15 NOTE — Discharge Instructions (Signed)
Resume diet and activity as before.  Follow up with your cardiologist in 1-2 weeks

## 2016-02-15 NOTE — Consult Note (Signed)
[facility]  CARDIOLOGY CONSULT NOTE  Patient ID: Destiny [name] MRN: [id number] DOB/AGE: 1955/12/21 60 y.o.  Admit date: 02/14/2016 Referring Physician Sudini Primary Physician Posey Pronto Primary Cardiologist Gennette Pac [facility] ) Reason for Consultation Chest pain  HPI: 60 year old female referred for evaluation of intermittent chest pain and presyncope. The patient has known coronary artery disease, status post Resolute drug-eluting stent mid and distal LAD 04/15/2015. The patient reports that she was in her usual state of health, until day of admission, when she got up to take a shower, had to sit down and was shaking all over, and her eyes rolled back in her head for approximately less than 5 minutes. Stress was called at which time she was noted to be hyportensive. She was brought to [facility] emergency room where ECG was nondiagnostic. The patient was admitted to telemetry where she has ruled out for myocardial infarction with negative troponin. The patient does report a one-week history of exertional shortness of breath, with fatigue, intermittent left-sided chest discomfort.  Review of systems complete and found to be negative unless listed above     Past Medical History:  Diagnosis Date  . CAD (coronary artery disease)   . CVA (cerebral infarction)   . GERD (gastroesophageal reflux disease)   . Hyperlipidemia   . Hypertension   . Mitral valve disease   . Sleep apnea   . Stroke [facility])    x3    Past Surgical History:  Procedure Laterality Date  . CARPAL TUNNEL RELEASE Bilateral   . CHOLECYSTECTOMY    . COLON SURGERY    . TENNIS ELBOW RELEASE/NIRSCHEL PROCEDURE Left   . TONSILLECTOMY      Prescriptions Prior to Admission  Medication Sig Dispense Refill Last Dose  . acetaminophen (TYLENOL) 500 MG tablet Take 1,000 mg by mouth every 6 (six) hours as needed for mild pain or moderate pain.   prn at prn  . aspirin EC 81 MG tablet Take 81 mg by mouth daily.   02/14/2016 at 1000  .  cetirizine (ZYRTEC) 10 MG tablet Take 10 mg by mouth every morning.   prn at prn  . Cholecalciferol (VITAMIN D3) 5000 UNITS CAPS Take 5,000 Units by mouth daily.   02/14/2016 at 1000  . conjugated estrogens (PREMARIN) vaginal cream Place 1 Applicatorful vaginally 3 (three) times a week. At bedtime   Past Week at Unknown time  . diclofenac sodium (VOLTAREN) 1 % GEL Apply 2 g topically 4 (four) times daily as needed.   prn at prn  . esomeprazole (NEXIUM) 40 MG capsule Take 40 mg by mouth 2 (two) times daily.   02/14/2016 at 1000  . fluticasone (FLONASE) 50 MCG/ACT nasal spray Place 1 spray into both nostrils daily. 16 g 0 prn at prn  . furosemide (LASIX) 20 MG tablet Take 20 mg by mouth every morning.    02/14/2016 at 1000  . gabapentin (NEURONTIN) 600 MG tablet Take 300-1,500 mg by mouth See admin instructions. 300 mg every morning, 300 mg at lunch, and 1500 mg at bedtime   02/13/2016 at 2100  . hydrocortisone cream 0.5 % Apply 1 application topically 2 (two) times daily.   prn at prn  . hydrOXYzine (ATARAX/VISTARIL) 50 MG tablet Take 50 mg by mouth 3 (three) times daily as needed for anxiety or itching.   prn at prn  . lisinopril (PRINIVIL,ZESTRIL) 5 MG tablet Take 5 mg by mouth daily.   02/14/2016 at 1000  . metoprolol tartrate (LOPRESSOR) 25 MG tablet  Take 25 mg by mouth 2 (two) times daily.   02/14/2016 at 1000  . nitroGLYCERIN (NITROSTAT) 0.4 MG SL tablet Place 0.4 mg under the tongue every 5 (five) minutes as needed for chest pain.   prn at prn  . nystatin cream (MYCOSTATIN) Apply 1 application topically 4 (four) times daily.   prn at prn  . potassium chloride SA (K-DUR,KLOR-CON) 20 MEQ tablet Take 20 mEq by mouth daily.   02/14/2016 at 1000  . rosuvastatin (CRESTOR) 20 MG tablet Take 20 mg by mouth daily.   02/13/2016 at 2100  . ticagrelor (BRILINTA) 90 MG TABS tablet Take 90 mg by mouth 2 (two) times daily.   02/14/2016 at 1000  . triamcinolone cream (KENALOG) 0.1 % Apply 1 application topically 2  (two) times daily as needed (for inflammation).   prn at prn   Social History   Social History  . Marital status: Married    Spouse name: N/A  . Number of children: N/A  . Years of education: N/A   Occupational History  . Not on file.   Social History Main Topics  . Smoking status: Never Smoker  . Smokeless tobacco: Never Used  . Alcohol use No  . Drug use: No  . Sexual activity: Not Currently   Other Topics Concern  . Not on file   Social History Narrative  . No narrative on file    Family History  Problem Relation Age of Onset  . Heart disease Other   . Hypertension Other       Review of systems complete and found to be negative unless listed above      PHYSICAL EXAM  General: Well developed, well nourished, in no acute distress HEENT:  Normocephalic and atramatic Neck:  No JVD.  Lungs: Clear bilaterally to auscultation and percussion. Heart: HRRR . Normal S1 and S2 without gallops or murmurs.  Abdomen: Bowel sounds are positive, abdomen soft and non-tender  Msk:  Back normal, normal gait. Normal strength and tone for age. Extremities: No clubbing, cyanosis or edema.   Neuro: Alert and oriented X 3. Psych:  Good affect, responds appropriately  Labs:   Lab Results  Component Value Date   WBC 6.5 02/14/2016   HGB 8.2 (L) 02/14/2016   HCT 25.2 (L) 02/14/2016   MCV 75.7 (L) 02/14/2016   PLT 214 02/14/2016    Recent Labs Lab 02/14/16 1543  NA 139  K 3.9  CL 104  CO2 26  BUN 13  CREATININE 0.92  CALCIUM 8.8*  GLUCOSE 107*   Lab Results  Component Value Date   CKTOTAL 93 06/07/2014   CKMB 0.7 06/07/2014   TROPONINI <0.03 02/15/2016    Lab Results  Component Value Date   CHOL 93 02/15/2016   CHOL 140 09/10/2011   Lab Results  Component Value Date   HDL 42 02/15/2016   HDL 48 09/10/2011   Lab Results  Component Value Date   LDLCALC 23 02/15/2016   LDLCALC 58 09/10/2011   Lab Results  Component Value Date   TRIG 142 02/15/2016    TRIG 172 09/10/2011   Lab Results  Component Value Date   CHOLHDL 2.2 02/15/2016   No results found for: LDLDIRECT    Radiology: Dg Chest 2 View  Result Date: 02/14/2016 CLINICAL DATA:  Recent syncopal episode EXAM: CHEST  2 VIEW COMPARISON:  06/16/2015 FINDINGS: The heart size and mediastinal contours are within normal limits. Both lungs are clear. The visualized skeletal structures are  unremarkable. IMPRESSION: No active cardiopulmonary disease. Electronically Signed   By: Inez Catalina M.D.   On: 02/14/2016 16:56   EKG: Normal sinus rhythm  ASSESSMENT AND PLAN:   1. Presyncope, likely vasovagal in nature, without recurrence 2. Exertional dyspnea, intermittent left-sided chest pain, nondiagnostic ECG, negative troponin 3. Known CAD, status post DES mid and distal LAD 04/15/2015  Recommendations  1. Agree with current therapy 2. Defer full dose anticoagulation 3. Review 2-D echocardiogram 4. Lexiscan sestamibi study   Signed: Keaten Mashek MD,PhD, [facility] 02/15/2016, 8:59 AM

## 2016-02-16 NOTE — Discharge Summary (Signed)
[place] [place] NAME: Destiny [name]    MR#:  [id number]  DATE OF BIRTH:  [birth year]  DATE OF ADMISSION:  02/14/2016 ADMITTING PHYSICIAN: Dustin Flock, MD  DATE OF DISCHARGE: 02/15/2016  6:30 PM  PRIMARY CARE PHYSICIAN: Baltazar Apo, MD   ADMISSION DIAGNOSIS:  Syncope and collapse [R55] DOE (dyspnea on exertion) [R06.09] Syncope, unspecified syncope type [R55]  DISCHARGE DIAGNOSIS:  Active Problems:   Chest pain   SECONDARY DIAGNOSIS:   Past Medical History:  Diagnosis Date  . CAD (coronary artery disease)   . CVA (cerebral infarction)   . GERD (gastroesophageal reflux disease)   . Hyperlipidemia   . Hypertension   . Mitral valve disease   . Sleep apnea   . Stroke [facility])    x3     ADMITTING HISTORY  HISTORY OF PRESENT ILLNESS: Destiny [name]  is a 60 y.o. female with a known history of Coronary artery disease with previous stents in the past who states that she's been having chest pain for the past 1 week. She describes it as a left-sided chest pressure especially with exertion. She also complains of dyspnea on exertion. She did not seek any medical attention. Today she went to take a shower and then she sat down because she was not feeling well. At that point she had a episode of shaking according to the husband lasting 4-5 minutes. With her eyes rolling on the back of her head. He describes her whole body being tremulous. After that shaking stopped she was not completely lucid for 4-5 minutes. This when EMS was called. When EMS arrived her blood pressure was noted to be in the 80s. She complains of chronic lower extremity swelling. Especially on the right that has been evaluated in the past with a Doppler that was negative.   HOSPITAL COURSE:   * Atypical Chest pain  Patient was admitted to telemetry floor. Seen by cardiology and recommended stress test. Stress test was low risk scan which came it areas. Telemetry  showed no arrhythmias. Troponin checked 3 times was normal. Patient's pain was likely due to GERD.  There was some concern that patient had seizures but this seemed more like syncope due to low blood pressure. Her lisinopril is being held Lasix dose is being reduced in half.  Follow-up with her cardiologist at [facility] in 1 week.  CONSULTS OBTAINED:  Treatment Team:  Isaias Cowman, MD  DRUG ALLERGIES:   Allergies  Allergen Reactions  . Atorvastatin Hives    No s/sx of anaphylaxis, just intermittent hives.  Does not seem to be a class effect as she has tolerated lovastatin previously  . Diflucan [Fluconazole] Rash    DISCHARGE MEDICATIONS:   Discharge Medication List as of 02/15/2016  4:43 PM    CONTINUE these medications which have CHANGED   Details  furosemide (LASIX) 20 MG tablet Take 1 tablet (20 mg total) by mouth every other day., Starting Mon 02/15/2016, No Print    metoprolol tartrate (LOPRESSOR) 25 MG tablet Take 0.5 tablets (12.5 mg total) by mouth 2 (two) times daily., Starting Mon 02/15/2016, No Print      CONTINUE these medications which have NOT CHANGED   Details  acetaminophen (TYLENOL) 500 MG tablet Take 1,000 mg by mouth every 6 (six) hours as needed for mild pain or moderate pain., Historical Med    aspirin EC 81 MG tablet Take 81 mg by mouth daily., Historical Med    cetirizine (  ZYRTEC) 10 MG tablet Take 10 mg by mouth every morning., Historical Med    Cholecalciferol (VITAMIN D3) 5000 UNITS CAPS Take 5,000 Units by mouth daily., Historical Med    conjugated estrogens (PREMARIN) vaginal cream Place 1 Applicatorful vaginally 3 (three) times a week. At bedtime, Historical Med    diclofenac sodium (VOLTAREN) 1 % GEL Apply 2 g topically 4 (four) times daily as needed., Historical Med    esomeprazole (NEXIUM) 40 MG capsule Take 40 mg by mouth 2 (two) times daily., Historical Med    fluticasone (FLONASE) 50 MCG/ACT nasal spray Place 1 spray into both nostrils  daily., Starting Thu 07/02/2015, Print    gabapentin (NEURONTIN) 600 MG tablet Take 300-1,500 mg by mouth See admin instructions. 300 mg every morning, 300 mg at lunch, and 1500 mg at bedtime, Historical Med    hydrocortisone cream 0.5 % Apply 1 application topically 2 (two) times daily., Historical Med    hydrOXYzine (ATARAX/VISTARIL) 50 MG tablet Take 50 mg by mouth 3 (three) times daily as needed for anxiety or itching., Historical Med    nitroGLYCERIN (NITROSTAT) 0.4 MG SL tablet Place 0.4 mg under the tongue every 5 (five) minutes as needed for chest pain., Historical Med    nystatin cream (MYCOSTATIN) Apply 1 application topically 4 (four) times daily., Historical Med    potassium chloride SA (K-DUR,KLOR-CON) 20 MEQ tablet Take 20 mEq by mouth daily., Historical Med    rosuvastatin (CRESTOR) 20 MG tablet Take 20 mg by mouth daily., Historical Med    ticagrelor (BRILINTA) 90 MG TABS tablet Take 90 mg by mouth 2 (two) times daily., Historical Med    triamcinolone cream (KENALOG) 0.1 % Apply 1 application topically 2 (two) times daily as needed (for inflammation)., Historical Med      STOP taking these medications     lisinopril (PRINIVIL,ZESTRIL) 5 MG tablet         Today   VITAL SIGNS:  Blood pressure 123/70, pulse 84, temperature 98.5 F (36.9 C), temperature source Oral, resp. rate 18, height 5\' 2"  (1.575 m), weight 100 kg (220 lb 8 oz), SpO2 97 %.  I/O:  No intake or output data in the 24 hours ending 02/16/16 1447  PHYSICAL EXAMINATION:  Physical Exam  GENERAL:  60 y.o.-year-old patient lying in the bed with no acute distress.  LUNGS: Normal breath sounds bilaterally, no wheezing, rales,rhonchi or crepitation. No use of accessory muscles of respiration.  CARDIOVASCULAR: S1, S2 normal. No murmurs, rubs, or gallops.  ABDOMEN: Soft, non-tender, non-distended. Bowel sounds present. No organomegaly or mass.  NEUROLOGIC: Moves all 4 extremities. PSYCHIATRIC: The  patient is alert and oriented x 3.  SKIN: No obvious rash, lesion, or ulcer.   DATA REVIEW:   CBC  Recent Labs Lab 02/14/16 1543  WBC 6.5  HGB 8.2*  HCT 25.2*  PLT 214    Chemistries   Recent Labs Lab 02/14/16 1543  NA 139  K 3.9  CL 104  CO2 26  GLUCOSE 107*  BUN 13  CREATININE 0.92  CALCIUM 8.8*    Cardiac Enzymes  Recent Labs Lab 02/15/16 0357  TROPONINI <0.03    Microbiology Results  Results for orders placed or performed in visit on 07/03/13  Beta Strep Culture [facility])     Status: None   Collection Time: 07/03/13  5:00 PM  Result Value Ref Range Status   Micro Text Report   Final       SOURCE: THROAT    COMMENT  NO BETA STREPTOCOCCUS ISOLATED IN 48 HOURS   ANTIBIOTIC                                                        RADIOLOGY:  Dg Chest 2 View  Result Date: 02/14/2016 CLINICAL DATA:  Recent syncopal episode EXAM: CHEST  2 VIEW COMPARISON:  06/16/2015 FINDINGS: The heart size and mediastinal contours are within normal limits. Both lungs are clear. The visualized skeletal structures are unremarkable. IMPRESSION: No active cardiopulmonary disease. Electronically Signed   By: Inez Catalina M.D.   On: 02/14/2016 16:56  US Carotid Bilateral  Result Date: 02/15/2016 CLINICAL DATA:  Syncope and collapse. History of TIA, hypertension and hyperlipidemia. History of CAD, post coronary stent placement. EXAM: BILATERAL CAROTID DUPLEX ULTRASOUND TECHNIQUE: Pearline Cables scale imaging, color Doppler and duplex ultrasound were performed of bilateral carotid and vertebral arteries in the neck. COMPARISON:  Caught up ultrasound - 09/10/2011 FINDINGS: Criteria: Quantification of carotid stenosis is based on velocity parameters that correlate the residual internal carotid diameter with NASCET-based stenosis levels, using the diameter of the distal internal carotid lumen as the denominator for stenosis measurement. The following velocity measurements were  obtained: RIGHT ICA:  78/26 cm/sec CCA:  123XX123 cm/sec SYSTOLIC ICA/CCA RATIO:  0.7 DIASTOLIC ICA/CCA RATIO:  0.7 ECA:  164 cm/sec LEFT ICA:  85/35 cm/sec CCA:  [id number] cm/sec SYSTOLIC ICA/CCA RATIO:  0.8 DIASTOLIC ICA/CCA RATIO:  1.0 ECA:  142 cm/sec RIGHT CAROTID ARTERY: There is mild tortuosity of the right common carotid artery (image 4). There is a minimal amount of eccentric mixed echogenic plaque within the right carotid bulb (image 16), extending to involve the origin and proximal aspects of the right internal carotid artery (image 23), morphologically similar to the 08/2011 examination and again not resulting in elevated peak systolic velocities within the interrogated course the right internal carotid artery to suggest a hemodynamically significant stenosis. RIGHT VERTEBRAL ARTERY:  Antegrade flow LEFT CAROTID ARTERY: There is a minimal amount of the center mixed echogenic plaque within the left carotid bulb (image 46), extending to involve the origin and proximal aspects of the left internal carotid artery (image 54), morphologically similar to the 08/2011 examination and again not resulting in elevated peak systolic velocities within the interrogated course of the left internal carotid artery to suggest a hemodynamically significant stenosis. LEFT VERTEBRAL ARTERY:  Antegrade flow IMPRESSION: Minimal amount of bilateral atherosclerotic plaque, right subjectively greater than left, morphologically similar to the 08/2011 examination and again not resulting in a hemodynamically significant stenosis within either internal carotid artery. Electronically Signed   By: Sandi Mariscal M.D.   On: 02/15/2016 10:02  Nm Myocar Multi W/spect W/wall Motion / Ef  Result Date: 02/15/2016  Blood pressure demonstrated a normal response to exercise.  There was no ST segment deviation noted during stress.  The study is normal.  This is a low risk study.  The left ventricular ejection fraction is normal (55-65%).      Follow up with PCP in 1 week.  Management plans discussed with the patient, family and they are in agreement.  CODE STATUS:  Code Status History    Date Active Date Inactive Code Status Order ID Comments User Context   02/15/2016  8:06 AM 02/15/2016  9:36 PM Full Code [id number]  Hillary Bow, MD Inpatient  02/14/2016  5:28 PM 02/15/2016  7:47 AM Full Code [id number]  Dustin Flock, MD ED   06/16/2015  5:53 PM 06/19/2015  6:22 PM Full Code [id number]  Clayburn Pert, MD Inpatient      TOTAL TIME TAKING CARE OF THIS PATIENT ON DAY OF DISCHARGE: more than 30 minutes.   Hillary Bow R M.D on 02/16/2016 at 2:47 PM  Between 7am to 6pm - Pager - [phone]  After 6pm go to [email or web address] - password [email or web address] [place] [facility]  Office  [phone]  CC: Primary care physician; Baltazar Apo, MD  Note: This dictation was prepared with Dragon dictation along with smaller phrase technology. Any transcriptional errors that result from this process are unintentional.

## 2016-02-18 MED FILL — Perflutren Lipid Microsphere IV Susp 1.1 MG/ML: INTRAVENOUS | Qty: 10 | Status: AC

## 2016-02-27 ENCOUNTER — Emergency Department: Payer: Self-pay

## 2016-02-27 ENCOUNTER — Encounter: Payer: Self-pay | Admitting: Emergency Medicine

## 2016-02-27 ENCOUNTER — Emergency Department
Admission: EM | Admit: 2016-02-27 | Discharge: 2016-02-28 | Disposition: A | Discharge status: Home / Self Care | Payer: Self-pay | Attending: Emergency Medicine | Admitting: Emergency Medicine

## 2016-02-27 DIAGNOSIS — Z7982 Long term (current) use of aspirin: Secondary | ICD-10-CM | POA: Insufficient documentation

## 2016-02-27 DIAGNOSIS — Y939 Activity, unspecified: Secondary | ICD-10-CM | POA: Insufficient documentation

## 2016-02-27 DIAGNOSIS — Y999 Unspecified external cause status: Secondary | ICD-10-CM | POA: Insufficient documentation

## 2016-02-27 DIAGNOSIS — I2510 Atherosclerotic heart disease of native coronary artery without angina pectoris: Secondary | ICD-10-CM | POA: Insufficient documentation

## 2016-02-27 DIAGNOSIS — I10 Essential (primary) hypertension: Secondary | ICD-10-CM | POA: Insufficient documentation

## 2016-02-27 DIAGNOSIS — Z79899 Other long term (current) drug therapy: Secondary | ICD-10-CM | POA: Insufficient documentation

## 2016-02-27 DIAGNOSIS — S301XXA Contusion of abdominal wall, initial encounter: Secondary | ICD-10-CM | POA: Insufficient documentation

## 2016-02-27 DIAGNOSIS — Y929 Unspecified place or not applicable: Secondary | ICD-10-CM | POA: Insufficient documentation

## 2016-02-27 DIAGNOSIS — W010XXA Fall on same level from slipping, tripping and stumbling without subsequent striking against object, initial encounter: Secondary | ICD-10-CM | POA: Insufficient documentation

## 2016-02-27 DIAGNOSIS — R0781 Pleurodynia: Secondary | ICD-10-CM | POA: Insufficient documentation

## 2016-02-27 HISTORY — DX: Cystocele, unspecified: N81.10

## 2016-02-27 MED ORDER — ONDANSETRON HCL 4 MG/2ML IJ SOLN
4.0000 mg | Freq: Once | INTRAMUSCULAR | Status: AC
  Administered 2016-02-27: 4 mg via INTRAVENOUS

## 2016-02-27 MED ORDER — MORPHINE SULFATE (PF) 2 MG/ML IV SOLN
2.0000 mg | Freq: Once | INTRAVENOUS | Status: AC
  Administered 2016-02-27: 2 mg via INTRAVENOUS

## 2016-02-27 MED ORDER — MORPHINE SULFATE (PF) 2 MG/ML IV SOLN
INTRAVENOUS | Status: AC
  Filled 2016-02-27: qty 1

## 2016-02-27 MED ORDER — ONDANSETRON HCL 4 MG/2ML IJ SOLN
INTRAMUSCULAR | Status: DC
Start: 2016-02-27 — End: 2016-02-28
  Filled 2016-02-27: qty 2

## 2016-02-27 NOTE — ED Provider Notes (Signed)
[facility] Emergency Department Provider Note  ____________________________________________   First MD Initiated Contact with Patient 02/27/16 2321     (approximate)  I have reviewed the triage vital signs and the nursing notes.   HISTORY  Chief Complaint Fall and Chest Pain    HPI Destiny [name] is a 60 y.o. female answered history of accidental slip and fall in the shower 4 days ago with resultant right flank injury. Patient states the area was sore initially with subsequent bruising however she was able to go to work the 2 days following however tonight patient states she rolled to the left to go to bed "heard a pop on the right side with resultant 10 out of 10 pain worse with movement and deep inspiration. Of note the patient has a history of CVA CAD currently taking Brilinta. Patient denies any head injury no loss of consciousness.   Past Medical History:  Diagnosis Date  . CAD (coronary artery disease)   . CVA (cerebral infarction)   . GERD (gastroesophageal reflux disease)   . Hyperlipidemia   . Hypertension   . Mitral valve disease   . Sleep apnea   . Stroke [facility])    x3    Patient Active Problem List   Diagnosis Date Noted  . Chest pain 02/14/2016  . Small bowel obstruction ([place]) 06/16/2015    Past Surgical History:  Procedure Laterality Date  . CARPAL TUNNEL RELEASE Bilateral   . CHOLECYSTECTOMY    . COLON SURGERY    . TENNIS ELBOW RELEASE/NIRSCHEL PROCEDURE Left   . TONSILLECTOMY      Prior to Admission medications   Medication Sig Start Date End Date Taking? Authorizing Provider  acetaminophen (TYLENOL) 500 MG tablet Take 1,000 mg by mouth every 6 (six) hours as needed for mild pain or moderate pain.    Historical Provider, MD  aspirin EC 81 MG tablet Take 81 mg by mouth daily.    Historical Provider, MD  cetirizine (ZYRTEC) 10 MG tablet Take 10 mg by mouth every morning.    Historical Provider, MD  Cholecalciferol (VITAMIN  D3) 5000 UNITS CAPS Take 5,000 Units by mouth daily.    Historical Provider, MD  conjugated estrogens (PREMARIN) vaginal cream Place 1 Applicatorful vaginally 3 (three) times a week. At bedtime    Historical Provider, MD  diclofenac sodium (VOLTAREN) 1 % GEL Apply 2 g topically 4 (four) times daily as needed.    Historical Provider, MD  esomeprazole (NEXIUM) 40 MG capsule Take 40 mg by mouth 2 (two) times daily.    Historical Provider, MD  fluticasone (FLONASE) 50 MCG/ACT nasal spray Place 1 spray into both nostrils daily. 07/02/15   Jenise V Bacon Menshew, PA-C  furosemide (LASIX) 20 MG tablet Take 1 tablet (20 mg total) by mouth every other day. 02/15/16   Srikar Sudini, MD  gabapentin (NEURONTIN) 600 MG tablet Take 300-1,500 mg by mouth See admin instructions. 300 mg every morning, 300 mg at lunch, and 1500 mg at bedtime    Historical Provider, MD  hydrocortisone cream 0.5 % Apply 1 application topically 2 (two) times daily.    Historical Provider, MD  hydrOXYzine (ATARAX/VISTARIL) 50 MG tablet Take 50 mg by mouth 3 (three) times daily as needed for anxiety or itching.    Historical Provider, MD  metoprolol tartrate (LOPRESSOR) 25 MG tablet Take 0.5 tablets (12.5 mg total) by mouth 2 (two) times daily. 02/15/16   Hillary Bow, MD  nitroGLYCERIN (NITROSTAT) 0.4 MG  SL tablet Place 0.4 mg under the tongue every 5 (five) minutes as needed for chest pain.    Historical Provider, MD  nystatin cream (MYCOSTATIN) Apply 1 application topically 4 (four) times daily.    Historical Provider, MD  potassium chloride SA (K-DUR,KLOR-CON) 20 MEQ tablet Take 20 mEq by mouth daily.    Historical Provider, MD  rosuvastatin (CRESTOR) 20 MG tablet Take 20 mg by mouth daily.    Historical Provider, MD  ticagrelor (BRILINTA) 90 MG TABS tablet Take 90 mg by mouth 2 (two) times daily.    Historical Provider, MD  triamcinolone cream (KENALOG) 0.1 % Apply 1 application topically 2 (two) times daily as needed (for  inflammation).    Historical Provider, MD    Allergies Atorvastatin and Diflucan [fluconazole]  Family History  Problem Relation Age of Onset  . Heart disease Other   . Hypertension Other     Social History Social History  Substance Use Topics  . Smoking status: Never Smoker  . Smokeless tobacco: Never Used  . Alcohol use No    Review of Systems Constitutional: No fever/chills Eyes: No visual changes. ENT: No sore throat. Cardiovascular: Denies chest pain. Respiratory: Denies shortness of breath. Gastrointestinal: No abdominal pain.  No nausea, no vomiting.  No diarrhea.  No constipation. Genitourinary: Negative for dysuria. Musculoskeletal: Negative for back pain. Skin: Negative for rash. Neurological: Negative for headaches, focal weakness or numbness.  10-point ROS otherwise negative.  ____________________________________________   PHYSICAL EXAM:  VITAL SIGNS: ED Triage Vitals  Enc Vitals Group     BP      Pulse      Resp      Temp      Temp src      SpO2      Weight      Height      Head Circumference      Peak Flow      Pain Score      Pain Loc      Pain Edu?      Excl. in [place]?     Constitutional: Alert and oriented. Well appearing and in no acute distress. Eyes: Conjunctivae are normal. PERRL. EOMI. Head: Atraumatic. Mouth/Throat: Mucous membranes are moist.  Oropharynx non-erythematous. Neck: No stridor.  No meningeal signs.   Cardiovascular: Normal rate, regular rhythm. Good peripheral circulation. Grossly normal heart sounds.   Respiratory: Normal respiratory effort.  No retractions. Lungs CTAB. Gastrointestinal: Soft and nontender. No distention.    Musculoskeletal: No lower extremity tenderness nor edema. No gross deformities of extremities. Neurologic:  Normal speech and language. No gross focal neurologic deficits are appreciated.  Skin:  7 x 8 cm area of ecchymoses right flank Psychiatric: Mood and affect are normal. Speech and  behavior are normal.  ____________________________________________   LABS (all labs ordered are listed, but only abnormal results are displayed)  Labs Reviewed  URINALYSIS COMPLETEWITH MICROSCOPIC ([place])  [place], [place], personally viewed and evaluated these images (plain radiographs) as part of my medical decision making, as well as reviewing the written report by the radiologist.  No results found.   Procedures      INITIAL IMPRESSION / ASSESSMENT AND PLAN / ED COURSE  Pertinent labs & imaging results that were available during my care of the patient were reviewed by me and considered in my medical decision making (see chart for details).  Given history of physical exam concern for  possible rib injury versus intra-abdominal pathology as such chest x-ray performed as well as CT scan of the abdomen and pelvis which revealed no gross allergy  Clinical Course    ____________________________________________  FINAL CLINICAL IMPRESSION(S) / ED DIAGNOSES  Final diagnoses:  Rib pain on right side  Contusion of flank, initial encounter     MEDICATIONS GIVEN DURING THIS VISIT:  Medications  morphine 2 MG/ML injection (not administered)  ondansetron (ZOFRAN) 4 MG/2ML injection (not administered)  morphine 2 MG/ML injection 2 mg (not administered)  ondansetron (ZOFRAN) injection 4 mg (not administered)     NEW OUTPATIENT MEDICATIONS STARTED DURING THIS VISIT:  New Prescriptions   No medications on file      Note:  This document was prepared using Dragon voice recognition software and may include unintentional dictation errors.    Gregor Hams, MD 02/28/16 [phone]

## 2016-02-27 NOTE — ED Triage Notes (Signed)
Pt arrived via EMS from home; say she slipped and fell out of the shower on Wednesday evening and fell across the top of the toilet; pt with purplish bruising to right mid back, tender to touch; pt says tonight she was laying in bed on her left side, when she rolled over and felt a popping sensation to same area;  had to be assisted to sitting up by grandson; says she's been hurting ever since; nonproductive cough reported by pt since last night; pt arrives awake, alert and oriented x 3; talking in complete coherent sentences

## 2016-02-28 ENCOUNTER — Emergency Department: Payer: Self-pay

## 2016-02-28 ENCOUNTER — Encounter: Payer: Self-pay | Admitting: Radiology

## 2016-02-28 LAB — COMPREHENSIVE METABOLIC PANEL
ALT: 16 U/L (ref 14–54)
AST: 23 U/L (ref 15–41)
Albumin: 3.3 g/dL — ABNORMAL LOW (ref 3.5–5.0)
Alkaline Phosphatase: 86 U/L (ref 38–126)
Anion gap: 5 (ref 5–15)
BILIRUBIN TOTAL: 0.5 mg/dL (ref 0.3–1.2)
BUN: 17 mg/dL (ref 6–20)
CO2: 24 mmol/L (ref 22–32)
CREATININE: 0.81 mg/dL (ref 0.44–1.00)
Calcium: 8.6 mg/dL — ABNORMAL LOW (ref 8.9–10.3)
Chloride: 108 mmol/L (ref 101–111)
GFR calc Af Amer: >60 mL/min (ref >60)
Glucose, Bld: 174 mg/dL — ABNORMAL HIGH (ref 65–99)
Potassium: 3.5 mmol/L (ref 3.5–5.1)
Sodium: 137 mmol/L (ref 135–145)
TOTAL PROTEIN: 6.6 g/dL (ref 6.5–8.1)

## 2016-02-28 LAB — CBC
HEMATOCRIT: 23 % — AB (ref 35.0–47.0)
Hemoglobin: 7.3 g/dL — ABNORMAL LOW (ref 12.0–16.0)
MCH: 24.5 pg — ABNORMAL LOW (ref 26.0–34.0)
MCHC: 32 g/dL (ref 32.0–36.0)
MCV: 76.7 fL — AB (ref 80.0–100.0)
Platelets: 211 10*3/uL (ref 150–440)
RBC: 2.99 MIL/uL — AB (ref 3.80–5.20)
RDW: 18.3 % — ABNORMAL HIGH (ref 11.5–14.5)
WBC: 6.4 10*3/uL (ref 3.6–11.0)

## 2016-02-28 MED ORDER — MORPHINE SULFATE (PF) 2 MG/ML IV SOLN
2.0000 mg | Freq: Once | INTRAVENOUS | Status: AC
  Administered 2016-02-28: 2 mg via INTRAVENOUS
  Filled 2016-02-28: qty 1

## 2016-02-28 MED ORDER — IOPAMIDOL (ISOVUE-300) INJECTION 61%
100.0000 mL | Freq: Once | INTRAVENOUS | Status: AC | PRN
  Administered 2016-02-28: 100 mL via INTRAVENOUS

## 2016-02-28 MED ORDER — KETOROLAC TROMETHAMINE 30 MG/ML IJ SOLN
30.0000 mg | Freq: Once | INTRAMUSCULAR | Status: AC
  Administered 2016-02-28: 30 mg via INTRAVENOUS
  Filled 2016-02-28: qty 1

## 2016-02-28 MED ORDER — OXYCODONE-ACETAMINOPHEN 5-325 MG PO TABS: 1.0000 | ORAL_TABLET | ORAL | 0 refills | Status: DC | PRN

## 2016-02-28 NOTE — ED Notes (Signed)
Pt awake and using call bell-asking for pain medication; Dr Owens Shark in to speak with pt

## 2016-02-28 NOTE — ED Notes (Signed)
In to check on pt, she has fallen back asleep; eyes closed, resp even and unlabored

## 2016-02-28 NOTE — ED Notes (Signed)
Patient transported to X-ray via Biomedical scientist by radiology tech

## 2016-02-28 NOTE — ED Notes (Signed)
Pt awake again and requesting pain medication; Dr Owens Shark aware and order placed

## 2016-02-28 NOTE — ED Notes (Signed)
Pt returned from xray

## 2016-03-18 ENCOUNTER — Emergency Department (HOSPITAL_COMMUNITY)
Admission: EM | Admit: 2016-03-18 | Discharge: 2016-03-18 | Disposition: A | Discharge status: Home / Self Care | Payer: Self-pay | Attending: Emergency Medicine | Admitting: Emergency Medicine

## 2016-03-18 ENCOUNTER — Encounter (HOSPITAL_COMMUNITY): Payer: Self-pay | Admitting: Emergency Medicine

## 2016-03-18 DIAGNOSIS — R109 Unspecified abdominal pain: Secondary | ICD-10-CM | POA: Insufficient documentation

## 2016-03-18 DIAGNOSIS — W19XXXA Unspecified fall, initial encounter: Secondary | ICD-10-CM | POA: Insufficient documentation

## 2016-03-18 DIAGNOSIS — I2510 Atherosclerotic heart disease of native coronary artery without angina pectoris: Secondary | ICD-10-CM | POA: Insufficient documentation

## 2016-03-18 DIAGNOSIS — Y999 Unspecified external cause status: Secondary | ICD-10-CM | POA: Insufficient documentation

## 2016-03-18 DIAGNOSIS — Z7982 Long term (current) use of aspirin: Secondary | ICD-10-CM | POA: Insufficient documentation

## 2016-03-18 DIAGNOSIS — M25519 Pain in unspecified shoulder: Secondary | ICD-10-CM | POA: Insufficient documentation

## 2016-03-18 DIAGNOSIS — Z8673 Personal history of transient ischemic attack (TIA), and cerebral infarction without residual deficits: Secondary | ICD-10-CM | POA: Insufficient documentation

## 2016-03-18 DIAGNOSIS — I10 Essential (primary) hypertension: Secondary | ICD-10-CM | POA: Insufficient documentation

## 2016-03-18 DIAGNOSIS — M7918 Myalgia, other site: Secondary | ICD-10-CM

## 2016-03-18 DIAGNOSIS — Y92009 Unspecified place in unspecified non-institutional (private) residence as the place of occurrence of the external cause: Secondary | ICD-10-CM | POA: Insufficient documentation

## 2016-03-18 DIAGNOSIS — Y939 Activity, unspecified: Secondary | ICD-10-CM | POA: Insufficient documentation

## 2016-03-18 DIAGNOSIS — Z79899 Other long term (current) drug therapy: Secondary | ICD-10-CM | POA: Insufficient documentation

## 2016-03-18 MED ORDER — CYCLOBENZAPRINE HCL 10 MG PO TABS: 10.0000 mg | ORAL_TABLET | Freq: Two times a day (BID) | ORAL | 0 refills | Status: DC | PRN

## 2016-03-18 MED ORDER — NAPROXEN 500 MG PO TABS: 500.0000 mg | ORAL_TABLET | Freq: Two times a day (BID) | ORAL | 0 refills | Status: AC

## 2016-03-18 NOTE — ED Provider Notes (Signed)
[place] DEPT Provider Note   CSN: [id number] Arrival date & time: 03/18/16  1930   History   Chief Complaint Chief Complaint  Patient presents with  . Fall  . Back Pain    HPI Destiny [name] is a 60 y.o. female.  The history is provided by the patient, the spouse and medical records.   80 yo F with hx CAD, CVA, OSA, HTN, HLD, GERD presenting with back pain. Onset 3 wks ago. Context- pt sustained mechanical fall in the shower and was on blood thinners, states she had a large hematoma in her back and has had pain since. Located in right flank. Constant. Described as sore and tight. Worse with twisting, movement, position changes. Alleviated some by resting. No associated dysuria, hematuria, frequency, fevers, chest pain, SOB, cough, n/v/d, weakness, numbness, bowel/bladder incontinence, gait disturbances. No new injury. Pt was seen a few days after her initial fall and had full CT CAP at that time that showed a very large hematoma but no other acute deeper injury.    Past Medical History:  Diagnosis Date  . CAD (coronary artery disease)   . CVA (cerebral infarction)   . Female bladder prolapse   . GERD (gastroesophageal reflux disease)   . Hyperlipidemia   . Hypertension   . Mitral valve disease   . Sleep apnea   . Stroke [facility])    x3    Patient Active Problem List   Diagnosis Date Noted  . Chest pain 02/14/2016  . Small bowel obstruction ([place]) 06/16/2015    Past Surgical History:  Procedure Laterality Date  . CARPAL TUNNEL RELEASE Bilateral   . CHOLECYSTECTOMY    . COLON SURGERY    . TENNIS ELBOW RELEASE/NIRSCHEL PROCEDURE Left   . TONSILLECTOMY      OB History    No data available       Home Medications    Prior to Admission medications   Medication Sig Start Date End Date Taking? Authorizing Provider  acetaminophen (TYLENOL) 500 MG tablet Take 1,000 mg by mouth every 6 (six) hours as needed for mild pain or moderate pain.   Yes Historical Provider,  MD  aspirin EC 81 MG tablet Take 81 mg by mouth every morning.    Yes Historical Provider, MD  cetirizine (ZYRTEC) 10 MG tablet Take 10 mg by mouth daily as needed for allergies.    Yes Historical Provider, MD  Cholecalciferol (VITAMIN D3) 5000 UNITS CAPS Take 5,000 Units by mouth daily.   Yes Historical Provider, MD  conjugated estrogens (PREMARIN) vaginal cream Place 1 Applicatorful vaginally at bedtime as needed (for vaginal health). At bedtime    Yes Historical Provider, MD  diclofenac sodium (VOLTAREN) 1 % GEL Apply 2 g topically 4 (four) times daily as needed (for pain).    Yes Historical Provider, MD  esomeprazole (NEXIUM) 40 MG capsule Take 40 mg by mouth 2 (two) times daily.   Yes Historical Provider, MD  furosemide (LASIX) 20 MG tablet Take 1 tablet (20 mg total) by mouth every other day. 02/15/16  Yes Srikar Sudini, MD  gabapentin (NEURONTIN) 600 MG tablet Take 1,800 mg by mouth at bedtime.    Yes Historical Provider, MD  hydrocortisone cream 0.5 % Apply 1 application topically daily as needed for itching.    Yes Historical Provider, MD  metoprolol tartrate (LOPRESSOR) 25 MG tablet Take 0.5 tablets (12.5 mg total) by mouth 2 (two) times daily. 02/15/16  Yes Hillary Bow, MD  nitroGLYCERIN (NITROSTAT) 0.4  MG SL tablet Place 0.4 mg under the tongue every 5 (five) minutes as needed for chest pain.   Yes Historical Provider, MD  nystatin cream (MYCOSTATIN) Apply 1 application topically daily as needed for dry skin.    Yes Historical Provider, MD  potassium chloride SA (K-DUR,KLOR-CON) 20 MEQ tablet Take 20 mEq by mouth daily.   Yes Historical Provider, MD  rosuvastatin (CRESTOR) 20 MG tablet Take 20 mg by mouth every evening.    Yes Historical Provider, MD  ticagrelor (BRILINTA) 90 MG TABS tablet Take 90 mg by mouth 2 (two) times daily.   Yes Historical Provider, MD  cyclobenzaprine (FLEXERIL) 10 MG tablet Take 1 tablet (10 mg total) by mouth 2 (two) times daily as needed for muscle spasms.  03/18/16   Ivin Booty, MD  fluticasone (FLONASE) 50 MCG/ACT nasal spray Place 1 spray into both nostrils daily. Patient not taking: Reported on 03/18/2016 07/02/15   Jenise V Bacon Menshew, PA-C  naproxen (NAPROSYN) 500 MG tablet Take 1 tablet (500 mg total) by mouth 2 (two) times daily. 03/18/16 03/28/16  Ivin Booty, MD    Family History Family History  Problem Relation Age of Onset  . Heart disease Other   . Hypertension Other     Social History Social History  Substance Use Topics  . Smoking status: Never Smoker  . Smokeless tobacco: Never Used  . Alcohol use No     Allergies   Atorvastatin; Diflucan [fluconazole]; and Sertraline   Review of Systems Review of Systems  Constitutional: Negative for chills and fever.  Respiratory: Negative for cough and shortness of breath.   Cardiovascular: Negative for chest pain and palpitations.  Gastrointestinal: Negative for abdominal pain, blood in stool, constipation, diarrhea, nausea and vomiting.  Genitourinary: Negative for dysuria, frequency, hematuria, vaginal bleeding and vaginal discharge.  Musculoskeletal: Positive for back pain. Negative for gait problem.  Skin: Positive for wound (residual hematoma to right flank, mostly resolved).  Neurological: Negative for weakness and numbness.  Hematological: Bruises/bleeds easily.  All other systems reviewed and are negative.   Physical Exam Updated Vital Signs BP 113/68   Pulse 77   Temp 98.4 F (36.9 C) (Oral)   Resp 16   Ht 5\' 2"  (1.575 m)   Wt 98.4 kg   SpO2 100%   BMI 39.69 kg/m   Physical Exam  Constitutional: She appears well-developed and well-nourished. No distress.  HENT:  Head: Normocephalic and atraumatic.  Eyes: Conjunctivae are normal.  Neck: Neck supple.  Cardiovascular: Normal rate, regular rhythm and intact distal pulses.   Pulmonary/Chest: Effort normal and breath sounds normal. No respiratory distress. She exhibits no tenderness.  Abdominal: Soft.  There is no tenderness.  Musculoskeletal: She exhibits tenderness (very faint contusion, nearly resolved, in right flank and paraspinal area. TTP over palpation. no midline TTP, stepoff, deformity). She exhibits no edema.  Neurological: She is alert. She has normal strength and normal reflexes. She displays no tremor. No sensory deficit. She exhibits normal muscle tone. Coordination and gait normal.  Skin: Skin is warm and dry.  Psychiatric: She has a normal mood and affect.  Nursing note and vitals reviewed.    ED Treatments / Results  Labs (all labs ordered are listed, but only abnormal results are displayed) Labs Reviewed - No data to display  EKG  EKG Interpretation None       Radiology No results found.  Procedures Procedures (including critical care time)  Medications Ordered in ED Medications - No data to  display   Initial Impression / Assessment and Plan / ED Course  I have reviewed the triage vital signs and the nursing notes.  Pertinent labs & imaging results that were available during my care of the patient were reviewed by me and considered in my medical decision making (see chart for details).  Clinical Course    60 yo F with hx CAD, CVA, OSA, HTN, HLD, GERD presenting with persistent back pain as above, following mechanical fall a few wks ago with resulting lage hematoma. A few days after the injury, had CT CAP that ruled out deeper injury. Pain has been persistent since that time. No new symptoms or deficits. Pt is afebrile with stable vitals. Neuro intact. Impression is musculoskeletal pain. Will tx symptomatically with NSAIDs, flexeril, ice, and advised f/u with PCP for referral to physical therapy. Pt agreeable with plan. Return precautions discussed. Dc in stable condition.   Case discussed with Dr. Laneta Simmers who oversaw management of this patient.   Final Clinical Impressions(s) / ED Diagnoses   Final diagnoses:  Musculoskeletal pain    New  Prescriptions Discharge Medication List as of 03/18/2016  9:28 PM    START taking these medications   Details  cyclobenzaprine (FLEXERIL) 10 MG tablet Take 1 tablet (10 mg total) by mouth 2 (two) times daily as needed for muscle spasms., Starting Fri 03/18/2016, Print    naproxen (NAPROSYN) 500 MG tablet Take 1 tablet (500 mg total) by mouth 2 (two) times daily., Starting Fri 03/18/2016, Until Mon 03/28/2016, Print         Ivin Booty, MD 03/19/16 2118    Leo Grosser, MD 03/19/16 2328

## 2016-03-18 NOTE — ED Triage Notes (Signed)
Patient fell 3 weeks ago at home , reports persistent right lower back pain worse with movement , denies hematuria or urinary discomfort.

## 2016-03-18 NOTE — ED Notes (Signed)
Pt left at this time with all belongings.  

## 2016-08-01 ENCOUNTER — Emergency Department: Payer: Self-pay

## 2016-08-01 ENCOUNTER — Encounter: Payer: Self-pay | Admitting: Emergency Medicine

## 2016-08-01 ENCOUNTER — Emergency Department
Admission: EM | Admit: 2016-08-01 | Discharge: 2016-08-01 | Disposition: A | Discharge status: Home / Self Care | Payer: Self-pay | Attending: Emergency Medicine | Admitting: Emergency Medicine

## 2016-08-01 DIAGNOSIS — J101 Influenza due to other identified influenza virus with other respiratory manifestations: Secondary | ICD-10-CM

## 2016-08-01 DIAGNOSIS — Z7982 Long term (current) use of aspirin: Secondary | ICD-10-CM | POA: Insufficient documentation

## 2016-08-01 DIAGNOSIS — Z79899 Other long term (current) drug therapy: Secondary | ICD-10-CM | POA: Insufficient documentation

## 2016-08-01 DIAGNOSIS — J09X2 Influenza due to identified novel influenza A virus with other respiratory manifestations: Secondary | ICD-10-CM | POA: Insufficient documentation

## 2016-08-01 DIAGNOSIS — I2510 Atherosclerotic heart disease of native coronary artery without angina pectoris: Secondary | ICD-10-CM | POA: Insufficient documentation

## 2016-08-01 DIAGNOSIS — I10 Essential (primary) hypertension: Secondary | ICD-10-CM | POA: Insufficient documentation

## 2016-08-01 LAB — BASIC METABOLIC PANEL
Anion gap: 10 (ref 5–15)
BUN: 19 mg/dL (ref 6–20)
CALCIUM: 9.6 mg/dL (ref 8.9–10.3)
CO2: 25 mmol/L (ref 22–32)
Chloride: 101 mmol/L (ref 101–111)
Creatinine, Ser: 1.15 mg/dL — ABNORMAL HIGH (ref 0.44–1.00)
GFR calc Af Amer: 58 mL/min — ABNORMAL LOW (ref >60)
GFR, EST NON AFRICAN AMERICAN: 50 mL/min — AB (ref >60)
GLUCOSE: 99 mg/dL (ref 65–99)
Potassium: 3.9 mmol/L (ref 3.5–5.1)
Sodium: 136 mmol/L (ref 135–145)

## 2016-08-01 LAB — CBC WITH DIFFERENTIAL/PLATELET
BASOS ABS: 0 10*3/uL (ref 0–0.1)
BASOS PCT: 1 %
EOS PCT: 0 %
Eosinophils Absolute: 0 10*3/uL (ref 0–0.7)
HEMATOCRIT: 41.8 % (ref 35.0–47.0)
Hemoglobin: 13.9 g/dL (ref 12.0–16.0)
LYMPHS PCT: 11 %
Lymphs Abs: 0.8 10*3/uL — ABNORMAL LOW (ref 1.0–3.6)
MCH: 30 pg (ref 26.0–34.0)
MCHC: 33.2 g/dL (ref 32.0–36.0)
MCV: 90.3 fL (ref 80.0–100.0)
MONO ABS: 0.8 10*3/uL (ref 0.2–0.9)
MONOS PCT: 11 %
Neutro Abs: 5.3 10*3/uL (ref 1.4–6.5)
Neutrophils Relative %: 77 %
PLATELETS: 198 10*3/uL (ref 150–440)
RBC: 4.63 MIL/uL (ref 3.80–5.20)
RDW: 14.5 % (ref 11.5–14.5)
WBC: 6.9 10*3/uL (ref 3.6–11.0)

## 2016-08-01 LAB — INFLUENZA PANEL BY PCR (TYPE A & B)
Influenza A By PCR: POSITIVE — AB
Influenza B By PCR: NEGATIVE

## 2016-08-01 MED ORDER — IBUPROFEN 600 MG PO TABS: 600.0000 mg | ORAL_TABLET | Freq: Three times a day (TID) | ORAL | 0 refills | Status: DC | PRN

## 2016-08-01 MED ORDER — OSELTAMIVIR PHOSPHATE 75 MG PO CAPS: 75.0000 mg | ORAL_CAPSULE | Freq: Two times a day (BID) | ORAL | 0 refills | Status: AC

## 2016-08-01 MED ORDER — PSEUDOEPH-BROMPHEN-DM 30-2-10 MG/5ML PO SYRP: 5.0000 mL | ORAL_SOLUTION | Freq: Four times a day (QID) | ORAL | 0 refills | Status: DC | PRN

## 2016-08-01 MED ORDER — BENZONATATE 100 MG PO CAPS
200.0000 mg | ORAL_CAPSULE | Freq: Once | ORAL | Status: AC
  Administered 2016-08-01: 200 mg via ORAL
  Filled 2016-08-01: qty 2

## 2016-08-01 NOTE — ED Provider Notes (Signed)
EKG interpreted by me Sinus tachycardia rate 101, normal axis intervals QRS ST segments and T waves.   Carrie Mew, MD 08/01/16 [phone]

## 2016-08-01 NOTE — ED Provider Notes (Signed)
[facility] Emergency Department Provider Note   ____________________________________________   None    (approximate)  I have reviewed the triage vital signs and the nursing notes.   HISTORY  Chief Complaint Cough    HPI Destiny [name] is a 61 y.o. female patient complaining of chest congestion with cough. Patient cough is nonproductive. Onset last night. Patient state grandson recent hayfever and cough and diagnosed with viral illness. Patient had not  taken  flu shot this season.Patient denies pain at this time. No palliative measures for this complaint. Patient husband is here with the same complaints.   Past Medical History:  Diagnosis Date  . CAD (coronary artery disease)   . CVA (cerebral infarction)   . Female bladder prolapse   . GERD (gastroesophageal reflux disease)   . Hyperlipidemia   . Hypertension   . Mitral valve disease   . Sleep apnea   . Stroke [facility])    x3    Patient Active Problem List   Diagnosis Date Noted  . Chest pain 02/14/2016  . Small bowel obstruction 06/16/2015    Past Surgical History:  Procedure Laterality Date  . CARPAL TUNNEL RELEASE Bilateral   . CHOLECYSTECTOMY    . COLON SURGERY    . TENNIS ELBOW RELEASE/NIRSCHEL PROCEDURE Left   . TONSILLECTOMY      Prior to Admission medications   Medication Sig Start Date End Date Taking? Authorizing Provider  acetaminophen (TYLENOL) 500 MG tablet Take 1,000 mg by mouth every 6 (six) hours as needed for mild pain or moderate pain.    Historical Provider, MD  aspirin EC 81 MG tablet Take 81 mg by mouth every morning.     Historical Provider, MD  brompheniramine-pseudoephedrine-DM 30-2-10 MG/5ML syrup Take 5 mLs by mouth 4 (four) times daily as needed. 08/01/16   Sable Feil, PA-C  cetirizine (ZYRTEC) 10 MG tablet Take 10 mg by mouth daily as needed for allergies.     Historical Provider, MD  Cholecalciferol (VITAMIN D3) 5000 UNITS CAPS Take 5,000 Units by mouth  daily.    Historical Provider, MD  conjugated estrogens (PREMARIN) vaginal cream Place 1 Applicatorful vaginally at bedtime as needed (for vaginal health). At bedtime     Historical Provider, MD  cyclobenzaprine (FLEXERIL) 10 MG tablet Take 1 tablet (10 mg total) by mouth 2 (two) times daily as needed for muscle spasms. 03/18/16   Ivin Booty, MD  diclofenac sodium (VOLTAREN) 1 % GEL Apply 2 g topically 4 (four) times daily as needed (for pain).     Historical Provider, MD  esomeprazole (NEXIUM) 40 MG capsule Take 40 mg by mouth 2 (two) times daily.    Historical Provider, MD  fluticasone (FLONASE) 50 MCG/ACT nasal spray Place 1 spray into both nostrils daily. Patient not taking: Reported on 03/18/2016 07/02/15   Dannielle Karvonen Menshew, PA-C  furosemide (LASIX) 20 MG tablet Take 1 tablet (20 mg total) by mouth every other day. 02/15/16   Srikar Sudini, MD  gabapentin (NEURONTIN) 600 MG tablet Take 1,800 mg by mouth at bedtime.     Historical Provider, MD  hydrocortisone cream 0.5 % Apply 1 application topically daily as needed for itching.     Historical Provider, MD  ibuprofen (ADVIL,MOTRIN) 600 MG tablet Take 1 tablet (600 mg total) by mouth every 8 (eight) hours as needed. 08/01/16   Sable Feil, PA-C  metoprolol tartrate (LOPRESSOR) 25 MG tablet Take 0.5 tablets (12.5 mg total) by mouth 2 (  two) times daily. 02/15/16   Srikar Sudini, MD  nitroGLYCERIN (NITROSTAT) 0.4 MG SL tablet Place 0.4 mg under the tongue every 5 (five) minutes as needed for chest pain.    Historical Provider, MD  nystatin cream (MYCOSTATIN) Apply 1 application topically daily as needed for dry skin.     Historical Provider, MD  oseltamivir (TAMIFLU) 75 MG capsule Take 1 capsule (75 mg total) by mouth 2 (two) times daily. 08/01/16 08/06/16  Sable Feil, PA-C  potassium chloride SA (K-DUR,KLOR-CON) 20 MEQ tablet Take 20 mEq by mouth daily.    Historical Provider, MD  rosuvastatin (CRESTOR) 20 MG tablet Take 20 mg by mouth every  evening.     Historical Provider, MD  ticagrelor (BRILINTA) 90 MG TABS tablet Take 90 mg by mouth 2 (two) times daily.    Historical Provider, MD    Allergies Atorvastatin; Diflucan [fluconazole]; and Sertraline  Family History  Problem Relation Age of Onset  . Heart disease Other   . Hypertension Other     Social History Social History  Substance Use Topics  . Smoking status: Never Smoker  . Smokeless tobacco: Never Used  . Alcohol use No    Review of Systems Constitutional: No fever/chills. Body aches Eyes: No visual changes. ENT: No sore throat. Cardiovascular: Denies chest pain. Respiratory: Denies shortness of breath. Nonproductive cough and chest congestion Gastrointestinal: No abdominal pain.  No nausea, no vomiting.  No diarrhea.  No constipation. Genitourinary: Negative for dysuria. Musculoskeletal: Negative for back pain. Skin: Negative for rash. Neurological: Negative for headaches, focal weakness or numbness. Endocrine:Hyperlipidemia hypertension Hematological/Lymphatic: Allergic/Immunilogical: See medication list  ____________________________________________   PHYSICAL EXAM:  VITAL SIGNS: ED Triage Vitals  Enc Vitals Group     BP 08/01/16 1556 120/82     Pulse Rate 08/01/16 1556 (!) 110     Resp 08/01/16 1556 16     Temp 08/01/16 1556 98.9 F (37.2 C)     Temp Source 08/01/16 1556 Oral     SpO2 08/01/16 1556 98 %     Weight 08/01/16 1554 208 lb (94.3 kg)     Height 08/01/16 1554 5\' 2"  (1.575 m)     Head Circumference --      Peak Flow --      Pain Score 08/01/16 1554 0     Pain Loc --      Pain Edu? --      Excl. in [place]? --     Constitutional: Alert and oriented. Well appearing and in no acute distress. Eyes: Conjunctivae are normal. PERRL. EOMI. Head: Atraumatic. Nose:Edematous nasal terms clear rhinorrhea Mouth/Throat: Mucous membranes are moist.  Oropharynx non-erythematous. Neck: No stridor.  No cervical spine tenderness to  palpation. Hematological/Lymphatic/Immunilogical: No cervical lymphadenopathy. Cardiovascular: Normal rate, regular rhythm. Grossly normal heart sounds.  Good peripheral circulation. Respiratory: Normal respiratory effort.  No retractions. Lungs CTAB Nonproductive cough Gastrointestinal: Soft and nontender. No distention. No abdominal bruits. No CVA tenderness. Musculoskeletal: No lower extremity tenderness nor edema.  No joint effusions. Neurologic:  Normal speech and language. No gross focal neurologic deficits are appreciated. No gait instability. Skin:  Skin is warm, dry and intact. No rash noted. Psychiatric: Mood and affect are normal. Speech and behavior are normal.  ____________________________________________   LABS (all labs ordered are listed, but only abnormal results are displayed)  Labs Reviewed  BASIC METABOLIC PANEL - Abnormal; Notable for the following:       Result Value   Creatinine, Ser 1.15 (*)  GFR calc non Af Amer 50 (*)    GFR calc Af Amer 58 (*)    All other components within normal limits  CBC WITH DIFFERENTIAL/PLATELET - Abnormal; Notable for the following:    Lymphs Abs 0.8 (*)    All other components within normal limits  INFLUENZA PANEL BY PCR (TYPE A & B) - Abnormal; Notable for the following:    Influenza A By PCR POSITIVE (*)    All other components within normal limits   ____________________________________________  EKG  EKG read by heart station revealing tachycardic otherwise normal EKG ____________________________________________  RADIOLOGY  No acute findings on chest x-ray ____________________________________________   PROCEDURES  Procedure(s) performed: None  Procedures  Critical Care performed: No  ____________________________________________   INITIAL IMPRESSION / ASSESSMENT AND PLAN / ED COURSE  Pertinent labs & imaging results that were available during my care of the patient were reviewed by me and considered in my  medical decision making (see chart for details).  Patient's positive for influenza A. Patient given discharge care instructions. Patient given prescription for Tamiflu, ibuprofen, and Bromfed-DM. Patient advised follow-up with family doctor as needed.  Clinical Course    Patient presented with acute onset of cough chest pain and fever last night. Discussed patient positive fluid results today.  ____________________________________________   FINAL CLINICAL IMPRESSION(S) / ED DIAGNOSES  Final diagnoses:  Influenza A      NEW MEDICATIONS STARTED DURING THIS VISIT:  New Prescriptions   BROMPHENIRAMINE-PSEUDOEPHEDRINE-DM 30-2-10 MG/5ML SYRUP    Take 5 mLs by mouth 4 (four) times daily as needed.   IBUPROFEN (ADVIL,MOTRIN) 600 MG TABLET    Take 1 tablet (600 mg total) by mouth every 8 (eight) hours as needed.   OSELTAMIVIR (TAMIFLU) 75 MG CAPSULE    Take 1 capsule (75 mg total) by mouth 2 (two) times daily.     Note:  This document was prepared using Dragon voice recognition software and may include unintentional dictation errors.    Sable Feil, PA-C 08/01/16 [place], MD 08/12/16 [phone]

## 2016-08-01 NOTE — ED Notes (Signed)
Patient denies pain and is resting comfortably.  

## 2016-08-01 NOTE — ED Triage Notes (Signed)
C/O cough since last night.  Patient states her grandson has recently had fever and cough. C/O chest pain with cough.  Cough is non productive.

## 2016-08-09 ENCOUNTER — Emergency Department: Payer: Self-pay

## 2016-08-09 ENCOUNTER — Encounter: Payer: Self-pay | Admitting: Emergency Medicine

## 2016-08-09 ENCOUNTER — Emergency Department
Admission: EM | Admit: 2016-08-09 | Discharge: 2016-08-09 | Disposition: A | Discharge status: Home / Self Care | Payer: Self-pay | Attending: Emergency Medicine | Admitting: Emergency Medicine

## 2016-08-09 DIAGNOSIS — Z8673 Personal history of transient ischemic attack (TIA), and cerebral infarction without residual deficits: Secondary | ICD-10-CM | POA: Insufficient documentation

## 2016-08-09 DIAGNOSIS — J069 Acute upper respiratory infection, unspecified: Secondary | ICD-10-CM | POA: Insufficient documentation

## 2016-08-09 DIAGNOSIS — B9789 Other viral agents as the cause of diseases classified elsewhere: Secondary | ICD-10-CM

## 2016-08-09 DIAGNOSIS — I2510 Atherosclerotic heart disease of native coronary artery without angina pectoris: Secondary | ICD-10-CM | POA: Insufficient documentation

## 2016-08-09 DIAGNOSIS — I10 Essential (primary) hypertension: Secondary | ICD-10-CM | POA: Insufficient documentation

## 2016-08-09 MED ORDER — HYDROCOD POLST-CPM POLST ER 10-8 MG/5ML PO SUER
5.0000 mL | Freq: Once | ORAL | Status: AC
  Administered 2016-08-09: 5 mL via ORAL
  Filled 2016-08-09: qty 5

## 2016-08-09 MED ORDER — METHYLPREDNISOLONE 4 MG PO TBPK: ORAL_TABLET | ORAL | 0 refills | Status: DC

## 2016-08-09 MED ORDER — HYDROCOD POLST-CPM POLST ER 10-8 MG/5ML PO SUER: 5.0000 mL | Freq: Two times a day (BID) | ORAL | 0 refills | Status: DC

## 2016-08-09 NOTE — ED Provider Notes (Signed)
[facility] Emergency Department Provider Note   ____________________________________________   First MD Initiated Contact with Patient 08/09/16 1003     (approximate)  I have reviewed the triage vital signs and the nursing notes.   HISTORY  Chief Complaint Cough    HPI Destiny [name] is a 61 y.o. female productive cough for 5 days. Patient was seen 5 days ago and diagnosed with flu and mild bronchitic changes. Patient finished a course of Tamiflu and brought felt DM. Patient state her cough is remaining productive since her last visit. Patient denies any fever or chills associated with this complaint. Patient denies nausea vomiting or diarrhea. She does not smoke. Patient denies pain.   Past Medical History:  Diagnosis Date  . CAD (coronary artery disease)   . CVA (cerebral infarction)   . Female bladder prolapse   . GERD (gastroesophageal reflux disease)   . Hyperlipidemia   . Hypertension   . Mitral valve disease   . Sleep apnea   . Stroke [facility])    x3    Patient Active Problem List   Diagnosis Date Noted  . Chest pain 02/14/2016  . Small bowel obstruction 06/16/2015    Past Surgical History:  Procedure Laterality Date  . CARPAL TUNNEL RELEASE Bilateral   . CHOLECYSTECTOMY    . COLON SURGERY    . TENNIS ELBOW RELEASE/NIRSCHEL PROCEDURE Left   . TONSILLECTOMY      Prior to Admission medications   Medication Sig Start Date End Date Taking? Authorizing Provider  acetaminophen (TYLENOL) 500 MG tablet Take 1,000 mg by mouth every 6 (six) hours as needed for mild pain or moderate pain.    Historical Provider, MD  aspirin EC 81 MG tablet Take 81 mg by mouth every morning.     Historical Provider, MD  brompheniramine-pseudoephedrine-DM 30-2-10 MG/5ML syrup Take 5 mLs by mouth 4 (four) times daily as needed. 08/01/16   Sable Feil, PA-C  cetirizine (ZYRTEC) 10 MG tablet Take 10 mg by mouth daily as needed for allergies.     Historical  Provider, MD  chlorpheniramine-HYDROcodone (TUSSIONEX PENNKINETIC ER) 10-8 MG/5ML SUER Take 5 mLs by mouth 2 (two) times daily. 08/09/16   Sable Feil, PA-C  Cholecalciferol (VITAMIN D3) 5000 UNITS CAPS Take 5,000 Units by mouth daily.    Historical Provider, MD  conjugated estrogens (PREMARIN) vaginal cream Place 1 Applicatorful vaginally at bedtime as needed (for vaginal health). At bedtime     Historical Provider, MD  cyclobenzaprine (FLEXERIL) 10 MG tablet Take 1 tablet (10 mg total) by mouth 2 (two) times daily as needed for muscle spasms. 03/18/16   Ivin Booty, MD  diclofenac sodium (VOLTAREN) 1 % GEL Apply 2 g topically 4 (four) times daily as needed (for pain).     Historical Provider, MD  esomeprazole (NEXIUM) 40 MG capsule Take 40 mg by mouth 2 (two) times daily.    Historical Provider, MD  fluticasone (FLONASE) 50 MCG/ACT nasal spray Place 1 spray into both nostrils daily. Patient not taking: Reported on 03/18/2016 07/02/15   Dannielle Karvonen Menshew, PA-C  furosemide (LASIX) 20 MG tablet Take 1 tablet (20 mg total) by mouth every other day. 02/15/16   Srikar Sudini, MD  gabapentin (NEURONTIN) 600 MG tablet Take 1,800 mg by mouth at bedtime.     Historical Provider, MD  hydrocortisone cream 0.5 % Apply 1 application topically daily as needed for itching.     Historical Provider, MD  ibuprofen (ADVIL,MOTRIN)  600 MG tablet Take 1 tablet (600 mg total) by mouth every 8 (eight) hours as needed. 08/01/16   Sable Feil, PA-C  methylPREDNISolone (MEDROL DOSEPAK) 4 MG TBPK tablet Take Tapered dose as directed 08/09/16   Sable Feil, PA-C  metoprolol tartrate (LOPRESSOR) 25 MG tablet Take 0.5 tablets (12.5 mg total) by mouth 2 (two) times daily. 02/15/16   Srikar Sudini, MD  nitroGLYCERIN (NITROSTAT) 0.4 MG SL tablet Place 0.4 mg under the tongue every 5 (five) minutes as needed for chest pain.    Historical Provider, MD  nystatin cream (MYCOSTATIN) Apply 1 application topically daily as needed for  dry skin.     Historical Provider, MD  potassium chloride SA (K-DUR,KLOR-CON) 20 MEQ tablet Take 20 mEq by mouth daily.    Historical Provider, MD  rosuvastatin (CRESTOR) 20 MG tablet Take 20 mg by mouth every evening.     Historical Provider, MD  ticagrelor (BRILINTA) 90 MG TABS tablet Take 90 mg by mouth 2 (two) times daily.    Historical Provider, MD    Allergies Atorvastatin; Diflucan [fluconazole]; and Sertraline  Family History  Problem Relation Age of Onset  . Heart disease Other   . Hypertension Other     Social History Social History  Substance Use Topics  . Smoking status: Never Smoker  . Smokeless tobacco: Never Used  . Alcohol use No    Review of Systems Constitutional: No fever/chills Eyes: No visual changes. ENT: No sore throat. Cardiovascular: Denies chest pain. Respiratory: Denies shortness of breath. Gastrointestinal: No abdominal pain.  No nausea, no vomiting.  No diarrhea.  No constipation. Genitourinary: Negative for dysuria. Musculoskeletal: Negative for back pain. Skin: Negative for rash. Neurological: Negative for headaches, focal weakness or numbness. Endocrine:Hyperlipidemia hypertension. Allergic/Immunilogical: See medication list ___________________________________________   PHYSICAL EXAM:  VITAL SIGNS: ED Triage Vitals  Enc Vitals Group     BP 08/09/16 0928 123/84     Pulse Rate 08/09/16 0928 83     Resp 08/09/16 0928 18     Temp 08/09/16 0928 98.5 F (36.9 C)     Temp Source 08/09/16 0928 Oral     SpO2 08/09/16 0928 96 %     Weight 08/09/16 0929 208 lb (94.3 kg)     Height 08/09/16 0929 5\' 2"  (1.575 m)     Head Circumference --      Peak Flow --      Pain Score 08/09/16 0932 0     Pain Loc --      Pain Edu? --      Excl. in [place]? --     Constitutional: Alert and oriented. Well appearing and in no acute distress. Eyes: Conjunctivae are normal. PERRL. EOMI. Head: Atraumatic. Nose: No congestion/rhinnorhea. Mouth/Throat: Mucous  membranes are moist.  Oropharynx non-erythematous. Neck: No stridor.  No cervical spine tenderness to palpation. Hematological/Lymphatic/Immunilogical: No cervical lymphadenopathy. Cardiovascular: Normal rate, regular rhythm. Grossly normal heart sounds.  Good peripheral circulation. Respiratory: Normal respiratory effort.  No retractions. Lungs CTAB. Productive cough Gastrointestinal: Soft and nontender. No distention. No abdominal bruits. No CVA tenderness. Musculoskeletal: No lower extremity tenderness nor edema.  No joint effusions. Neurologic:  Normal speech and language. No gross focal neurologic deficits are appreciated. No gait instability. Skin:  Skin is warm, dry and intact. No rash noted. Psychiatric: Mood and affect are normal. Speech and behavior are normal.  ____________________________________________   LABS (all labs ordered are listed, but only abnormal results are displayed)  Labs Reviewed -  No data to display ____________________________________________  EKG   ____________________________________________  RADIOLOGY no acute findings on chest x-ray. ____________________________________________   PROCEDURES  Procedure(s) performed: None  Procedures  Critical Care performed: No  ____________________________________________   INITIAL IMPRESSION / ASSESSMENT AND PLAN / ED COURSE  Pertinent labs & imaging results that were available during my care of the patient were reviewed by me and considered in my medical decision making (see chart for details).  Viral bronchitis. Patient given discharge care instructions. Patient given prescription for Motrin dosepak and Tussionex. Patient advised follow-up family doctor for continued care.      ____________________________________________   FINAL CLINICAL IMPRESSION(S) / ED DIAGNOSES  Final diagnoses:  Viral URI with cough      NEW MEDICATIONS STARTED DURING THIS VISIT:  New Prescriptions    CHLORPHENIRAMINE-HYDROCODONE (TUSSIONEX PENNKINETIC ER) 10-8 MG/5ML SUER    Take 5 mLs by mouth 2 (two) times daily.   METHYLPREDNISOLONE (MEDROL DOSEPAK) 4 MG TBPK TABLET    Take Tapered dose as directed     Note:  This document was prepared using Dragon voice recognition software and may include unintentional dictation errors.    Sable Feil, PA-C 08/09/16 [name], MD 08/09/16 [phone]

## 2016-08-09 NOTE — ED Notes (Signed)
See triage note   Having some chest congestion with cough for coupl of days   Was dx'd with flu last week  Afebrile on arrival

## 2016-08-09 NOTE — ED Triage Notes (Signed)
Pt states was seen here last week with cough, pt states was dx with the flu on on 1/15. Pt presents with c/o productive cough with yellow/green sputum. Pt is alert and oriented at this time VSS and WNL.

## 2016-09-11 ENCOUNTER — Emergency Department
Admission: EM | Admit: 2016-09-11 | Discharge: 2016-09-11 | Disposition: A | Discharge status: Home / Self Care | Payer: Self-pay | Attending: Emergency Medicine | Admitting: Emergency Medicine

## 2016-09-11 ENCOUNTER — Emergency Department: Payer: Self-pay

## 2016-09-11 DIAGNOSIS — I2510 Atherosclerotic heart disease of native coronary artery without angina pectoris: Secondary | ICD-10-CM | POA: Insufficient documentation

## 2016-09-11 DIAGNOSIS — I10 Essential (primary) hypertension: Secondary | ICD-10-CM | POA: Insufficient documentation

## 2016-09-11 DIAGNOSIS — R05 Cough: Secondary | ICD-10-CM | POA: Insufficient documentation

## 2016-09-11 DIAGNOSIS — Z7982 Long term (current) use of aspirin: Secondary | ICD-10-CM | POA: Insufficient documentation

## 2016-09-11 DIAGNOSIS — Z791 Long term (current) use of non-steroidal anti-inflammatories (NSAID): Secondary | ICD-10-CM | POA: Insufficient documentation

## 2016-09-11 DIAGNOSIS — Z79899 Other long term (current) drug therapy: Secondary | ICD-10-CM | POA: Insufficient documentation

## 2016-09-11 DIAGNOSIS — R059 Cough, unspecified: Secondary | ICD-10-CM

## 2016-09-11 MED ORDER — FLUTICASONE PROPIONATE 50 MCG/ACT NA SUSP: 2.0000 | Freq: Every day | NASAL | 0 refills | Status: DC

## 2016-09-11 MED ORDER — BENZONATATE 100 MG PO CAPS: 100.0000 mg | ORAL_CAPSULE | Freq: Three times a day (TID) | ORAL | 0 refills | Status: DC | PRN

## 2016-09-11 NOTE — Discharge Instructions (Signed)
Your chest x-ray is normal today. You should take the prescription meds for cough relief. Restart your daily allergy medicine. Start OTC Delsym (dextromethorphan) for cough relief. Follow-up with your provider for continued symptoms.

## 2016-09-11 NOTE — ED Triage Notes (Signed)
Pt c/o cough with congestion for the past week.. Pt is in NAD on arrival, speaking in complete sentences.Marland Kitchen

## 2016-09-11 NOTE — ED Provider Notes (Signed)
[facility] Emergency Department Provider Note ____________________________________________  Time seen: 1241  I have reviewed the triage vital signs and the nursing notes.  HISTORY  Chief Complaint  Cough  HPI Destiny [name] is a 61 y.o. female presents to the ED for evaluation of a nonproductive cough for the last week. Denies any interim fevers, chills, sweats. She also denies any shortness of breath, wheezing. She scribes cough is slightly worse at night. She was recently diagnosed with influenza and a subsequent bronchitis of the back. She is completed 2 rounds of azithromycin over the last 6-8 weeks.  Past Medical History:  Diagnosis Date  . CAD (coronary artery disease)   . CVA (cerebral infarction)   . Female bladder prolapse   . GERD (gastroesophageal reflux disease)   . Hyperlipidemia   . Hypertension   . Mitral valve disease   . Sleep apnea   . Stroke [facility])    x3    Patient Active Problem List   Diagnosis Date Noted  . Chest pain 02/14/2016  . Small bowel obstruction 06/16/2015    Past Surgical History:  Procedure Laterality Date  . CARPAL TUNNEL RELEASE Bilateral   . CHOLECYSTECTOMY    . COLON SURGERY    . TENNIS ELBOW RELEASE/NIRSCHEL PROCEDURE Left   . TONSILLECTOMY      Prior to Admission medications   Medication Sig Start Date End Date Taking? Authorizing Provider  acetaminophen (TYLENOL) 500 MG tablet Take 1,000 mg by mouth every 6 (six) hours as needed for mild pain or moderate pain.    Historical Provider, MD  aspirin EC 81 MG tablet Take 81 mg by mouth every morning.     Historical Provider, MD  benzonatate (TESSALON PERLES) 100 MG capsule Take 1 capsule (100 mg total) by mouth 3 (three) times daily as needed for cough (Take 1-2 per dose). 09/11/16   Meher Kucinski V Bacon Jackalyn Haith, PA-C  brompheniramine-pseudoephedrine-DM 30-2-10 MG/5ML syrup Take 5 mLs by mouth 4 (four) times daily as needed. 08/01/16   Sable Feil, PA-C   cetirizine (ZYRTEC) 10 MG tablet Take 10 mg by mouth daily as needed for allergies.     Historical Provider, MD  chlorpheniramine-HYDROcodone (TUSSIONEX PENNKINETIC ER) 10-8 MG/5ML SUER Take 5 mLs by mouth 2 (two) times daily. 08/09/16   Sable Feil, PA-C  Cholecalciferol (VITAMIN D3) 5000 UNITS CAPS Take 5,000 Units by mouth daily.    Historical Provider, MD  conjugated estrogens (PREMARIN) vaginal cream Place 1 Applicatorful vaginally at bedtime as needed (for vaginal health). At bedtime     Historical Provider, MD  cyclobenzaprine (FLEXERIL) 10 MG tablet Take 1 tablet (10 mg total) by mouth 2 (two) times daily as needed for muscle spasms. 03/18/16   Ivin Booty, MD  diclofenac sodium (VOLTAREN) 1 % GEL Apply 2 g topically 4 (four) times daily as needed (for pain).     Historical Provider, MD  esomeprazole (NEXIUM) 40 MG capsule Take 40 mg by mouth 2 (two) times daily.    Historical Provider, MD  fluticasone (FLONASE) 50 MCG/ACT nasal spray Place 2 sprays into both nostrils daily. 09/11/16   Benjiman Sedgwick V Bacon Linkoln Alkire, PA-C  furosemide (LASIX) 20 MG tablet Take 1 tablet (20 mg total) by mouth every other day. 02/15/16   Srikar Sudini, MD  gabapentin (NEURONTIN) 600 MG tablet Take 1,800 mg by mouth at bedtime.     Historical Provider, MD  hydrocortisone cream 0.5 % Apply 1 application topically daily as needed for itching.  Historical Provider, MD  ibuprofen (ADVIL,MOTRIN) 600 MG tablet Take 1 tablet (600 mg total) by mouth every 8 (eight) hours as needed. 08/01/16   Sable Feil, PA-C  methylPREDNISolone (MEDROL DOSEPAK) 4 MG TBPK tablet Take Tapered dose as directed 08/09/16   Sable Feil, PA-C  metoprolol tartrate (LOPRESSOR) 25 MG tablet Take 0.5 tablets (12.5 mg total) by mouth 2 (two) times daily. 02/15/16   Srikar Sudini, MD  nitroGLYCERIN (NITROSTAT) 0.4 MG SL tablet Place 0.4 mg under the tongue every 5 (five) minutes as needed for chest pain.    Historical Provider, MD  nystatin cream  (MYCOSTATIN) Apply 1 application topically daily as needed for dry skin.     Historical Provider, MD  potassium chloride SA (K-DUR,KLOR-CON) 20 MEQ tablet Take 20 mEq by mouth daily.    Historical Provider, MD  rosuvastatin (CRESTOR) 20 MG tablet Take 20 mg by mouth every evening.     Historical Provider, MD  ticagrelor (BRILINTA) 90 MG TABS tablet Take 90 mg by mouth 2 (two) times daily.    Historical Provider, MD    Allergies Atorvastatin; Diflucan [fluconazole]; and Sertraline  Family History  Problem Relation Age of Onset  . Heart disease Other   . Hypertension Other     Social History Social History  Substance Use Topics  . Smoking status: Never Smoker  . Smokeless tobacco: Never Used  . Alcohol use No    Review of Systems  Constitutional: Negative for fever. Eyes: Negative for visual changes. ENT: Negative for sore throat. Cardiovascular: Negative for chest pain. Respiratory: Negative for shortness of breath. Reports cough Musculoskeletal: Negative for back pain. Skin: Negative for rash. ____________________________________________  PHYSICAL EXAM:  VITAL SIGNS: ED Triage Vitals  Enc Vitals Group     BP 09/11/16 1118 100/62     Pulse Rate 09/11/16 1118 69     Resp 09/11/16 1118 18     Temp 09/11/16 1118 98.5 F (36.9 C)     Temp Source 09/11/16 1118 Oral     SpO2 09/11/16 1118 98 %     Weight 09/11/16 1106 203 lb (92.1 kg)     Height 09/11/16 1106 5\' 2"  (1.575 m)     Head Circumference --      Peak Flow --      Pain Score --      Pain Loc --      Pain Edu? --      Excl. in [place]? --     Constitutional: Alert and oriented. Well appearing and in no distress. Head: Normocephalic and atraumatic. Eyes: Conjunctivae are normal. PERRL. Normal extraocular movements Ears: Canals clear. TMs intact bilaterally. Nose: No congestion/rhinorrhea/epistaxis. Mouth/Throat: Mucous membranes are moist. Uvula is midline and tonsils are flat. Neck: Supple. No  thyromegaly. Hematological/Lymphatic/Immunological: No cervical lymphadenopathy. Cardiovascular: Normal rate, regular rhythm. Normal distal pulses. Respiratory: Normal respiratory effort. No wheezes/rales/rhonchi. No cough is auscultated during exam. Gastrointestinal: Soft and nontender. No distention. Musculoskeletal: Nontender with normal range of motion in all extremities.  Neurologic:  Normal gait without ataxia. Normal speech and language. No gross focal neurologic deficits are appreciated. ____________________________________________   RADIOLOGY  CXR IMPRESSION: No active cardiopulmonary disease. ____________________________________________  INITIAL IMPRESSION / ASSESSMENT AND PLAN / ED COURSE  Patient with a presentation consistent with a bronchitis/viral cough likely due to postnasal drainage. She is reassured by her negative chest x-ray at this time. The patient is discharged with prescriptions for [facility] and Flonase. She is to dose over-the-counter Delsym  for cough relief. She is also encouraged to restart her weekly allergy medicine. She will follow-up with primary care provider for ongoing or worsening symptoms. ____________________________________________  FINAL CLINICAL IMPRESSION(S) / ED DIAGNOSES  Final diagnoses:  Cough      Melvenia Needles, PA-C 09/11/16 1354    Orbie Pyo, MD 09/11/16 [phone]

## 2016-09-11 NOTE — ED Notes (Signed)
Pt reports cough x 1 week, describes as strong and "hackey". Pt states was dx with the flu, approx 3 weeks ago, then was dx with bronchitis. Pt is alert and oriented at this time, respirations even and unlabored, skin warm, dry, and intact.

## 2016-12-22 ENCOUNTER — Encounter: Payer: Self-pay | Admitting: *Deleted

## 2016-12-22 ENCOUNTER — Emergency Department
Admission: EM | Admit: 2016-12-22 | Discharge: 2016-12-22 | Disposition: A | Discharge status: Home / Self Care | Payer: Self-pay | Attending: Emergency Medicine | Admitting: Emergency Medicine

## 2016-12-22 DIAGNOSIS — I10 Essential (primary) hypertension: Secondary | ICD-10-CM | POA: Insufficient documentation

## 2016-12-22 DIAGNOSIS — Z7982 Long term (current) use of aspirin: Secondary | ICD-10-CM | POA: Insufficient documentation

## 2016-12-22 DIAGNOSIS — Z8673 Personal history of transient ischemic attack (TIA), and cerebral infarction without residual deficits: Secondary | ICD-10-CM | POA: Insufficient documentation

## 2016-12-22 DIAGNOSIS — I2510 Atherosclerotic heart disease of native coronary artery without angina pectoris: Secondary | ICD-10-CM | POA: Insufficient documentation

## 2016-12-22 DIAGNOSIS — J302 Other seasonal allergic rhinitis: Secondary | ICD-10-CM | POA: Insufficient documentation

## 2016-12-22 DIAGNOSIS — Z79899 Other long term (current) drug therapy: Secondary | ICD-10-CM | POA: Insufficient documentation

## 2016-12-22 DIAGNOSIS — J029 Acute pharyngitis, unspecified: Secondary | ICD-10-CM | POA: Insufficient documentation

## 2016-12-22 MED ORDER — CETIRIZINE HCL 10 MG PO TABS: 10.0000 mg | ORAL_TABLET | Freq: Every day | ORAL | 2 refills | Status: DC

## 2016-12-22 NOTE — Discharge Instructions (Signed)
Follow-up with your primary care provider for symptoms that are not improving over the week. Return to the emergency department for symptoms that change or worsen if you're unable schedule an appointment.

## 2016-12-22 NOTE — ED Provider Notes (Signed)
[facility] Emergency Department Provider Note  ____________________________________________  Time seen: Approximately 6:50 PM  I have reviewed the triage vital signs and the nursing notes.   HISTORY  Chief Complaint Sore Throat   HPI Destiny [name] is a 61 y.o. female who presents to emergency department for evaluation ofsore throat started last Monday. Typically, her sore throat is relieved by reexamining taking her Nexium, but states that it has not gotten any better. She also states that she's had some nasal congestion, cough, runny nose, and sneezing over the past few days. Other than her routine medications, she has not taken any over-the-counter medications for the symptoms.   Past Medical History:  Diagnosis Date  . CAD (coronary artery disease)   . CVA (cerebral infarction)   . Female bladder prolapse   . GERD (gastroesophageal reflux disease)   . Hyperlipidemia   . Hypertension   . Mitral valve disease   . Sleep apnea   . Stroke [facility])    x3    Patient Active Problem List   Diagnosis Date Noted  . Chest pain 02/14/2016  . Small bowel obstruction ([place]) 06/16/2015    Past Surgical History:  Procedure Laterality Date  . CARPAL TUNNEL RELEASE Bilateral   . CHOLECYSTECTOMY    . COLON SURGERY    . TENNIS ELBOW RELEASE/NIRSCHEL PROCEDURE Left   . TONSILLECTOMY      Prior to Admission medications   Medication Sig Start Date End Date Taking? Authorizing Provider  acetaminophen (TYLENOL) 500 MG tablet Take 1,000 mg by mouth every 6 (six) hours as needed for mild pain or moderate pain.    [provider]  aspirin EC 81 MG tablet Take 81 mg by mouth every morning.     [provider]  benzonatate (TESSALON PERLES) 100 MG capsule Take 1 capsule (100 mg total) by mouth 3 (three) times daily as needed for cough (Take 1-2 per dose). 09/11/16   Menshew, Dannielle Karvonen, PA-C  brompheniramine-pseudoephedrine-DM 30-2-10 MG/5ML syrup  Take 5 mLs by mouth 4 (four) times daily as needed. 08/01/16   Sable Feil, PA-C  cetirizine (ZYRTEC) 10 MG tablet Take 1 tablet (10 mg total) by mouth daily. 12/22/16   Tryton Bodi, Johnette Abraham B, FNP  chlorpheniramine-HYDROcodone (TUSSIONEX PENNKINETIC ER) 10-8 MG/5ML SUER Take 5 mLs by mouth 2 (two) times daily. 08/09/16   Sable Feil, PA-C  Cholecalciferol (VITAMIN D3) 5000 UNITS CAPS Take 5,000 Units by mouth daily.    [provider]  conjugated estrogens (PREMARIN) vaginal cream Place 1 Applicatorful vaginally at bedtime as needed (for vaginal health). At bedtime     [provider]  cyclobenzaprine (FLEXERIL) 10 MG tablet Take 1 tablet (10 mg total) by mouth 2 (two) times daily as needed for muscle spasms. 03/18/16   Ivin Booty, MD  diclofenac sodium (VOLTAREN) 1 % GEL Apply 2 g topically 4 (four) times daily as needed (for pain).     [provider]  esomeprazole (NEXIUM) 40 MG capsule Take 40 mg by mouth 2 (two) times daily.    [provider]  fluticasone (FLONASE) 50 MCG/ACT nasal spray Place 2 sprays into both nostrils daily. 09/11/16   Menshew, Dannielle Karvonen, PA-C  furosemide (LASIX) 20 MG tablet Take 1 tablet (20 mg total) by mouth every other day. 02/15/16   Hillary Bow, MD  gabapentin (NEURONTIN) 600 MG tablet Take 1,800 mg by mouth at bedtime.     [provider]  hydrocortisone cream  0.5 % Apply 1 application topically daily as needed for itching.     [provider]  ibuprofen (ADVIL,MOTRIN) 600 MG tablet Take 1 tablet (600 mg total) by mouth every 8 (eight) hours as needed. 08/01/16   Sable Feil, PA-C  methylPREDNISolone (MEDROL DOSEPAK) 4 MG TBPK tablet Take Tapered dose as directed 08/09/16   Sable Feil, PA-C  metoprolol tartrate (LOPRESSOR) 25 MG tablet Take 0.5 tablets (12.5 mg total) by mouth 2 (two) times daily. 02/15/16   Hillary Bow, MD  nitroGLYCERIN (NITROSTAT) 0.4 MG SL tablet Place 0.4 mg under the tongue  every 5 (five) minutes as needed for chest pain.    [provider]  nystatin cream (MYCOSTATIN) Apply 1 application topically daily as needed for dry skin.     [provider]  potassium chloride SA (K-DUR,KLOR-CON) 20 MEQ tablet Take 20 mEq by mouth daily.    [provider]  rosuvastatin (CRESTOR) 20 MG tablet Take 20 mg by mouth every evening.     [provider]  ticagrelor (BRILINTA) 90 MG TABS tablet Take 90 mg by mouth 2 (two) times daily.    [provider]    Allergies Atorvastatin; Diflucan [fluconazole]; and Sertraline  Family History  Problem Relation Age of Onset  . Heart disease Other   . Hypertension Other     Social History Social History  Substance Use Topics  . Smoking status: Never Smoker  . Smokeless tobacco: Never Used  . Alcohol use No    Review of Systems Constitutional: Negative for fever/chills ENT: Positive for sore throat. Cardiovascular: Denies chest pain. Respiratory: Negative for shortness of breath. Negative for cough. Gastrointestinal: Negative for nausea,  negative for vomiting.  Negative for diarrhea.  Musculoskeletal: Negative for body aches Skin: Negative for rash. Neurological: Negative for headaches ____________________________________________   PHYSICAL EXAM:  VITAL SIGNS: ED Triage Vitals  Enc Vitals Group     BP 12/22/16 1827 101/74     Pulse Rate 12/22/16 1827 77     Resp 12/22/16 1827 16     Temp 12/22/16 1827 98.4 F (36.9 C)     Temp Source 12/22/16 1827 Oral     SpO2 12/22/16 1827 98 %     Weight 12/22/16 1824 202 lb (91.6 kg)     Height 12/22/16 1824 5\' 2"  (1.575 m)     Head Circumference --      Peak Flow --      Pain Score 12/22/16 1824 9     Pain Loc --      Pain Edu? --      Excl. in [place]? --     Constitutional: Alert and oriented. Well appearing and in no acute distress. Eyes: Conjunctivae are normal. EOMI. Ears: Bilateral TM intact and without concern of  infection. Nose: No congestion noted; no rhinnorhea. Mouth/Throat: Mucous membranes are moist.  Oropharynx mildly erythematous with cobblestone exudate. Tonsils not visualized. Neck: No stridor.  Lymphatic: No cervical lymphadenopathy. Cardiovascular: Normal rate, regular rhythm. Good peripheral circulation. Respiratory: Normal respiratory effort.  No retractions. Breath sounds clear to auscultation.. Gastrointestinal: Soft and nontender.  Musculoskeletal: FROM x 4 extremities.  Neurologic:  Normal speech and language.  Skin:  Skin is warm, dry and intact. No rash noted. Psychiatric: Mood and affect are normal. Speech and behavior are normal.  ____________________________________________   LABS (all labs ordered are listed, but only abnormal results are displayed)  Labs Reviewed - No data to display ____________________________________________  EKG  Not indicated. ____________________________________________  RADIOLOGY  Not indicated. ____________________________________________   PROCEDURES  Procedure(s) performed: None  Critical Care performed: No ____________________________________________   INITIAL IMPRESSION / ASSESSMENT AND PLAN / ED COURSE  61 year old female presenting to the emergency department for evaluation of sore throat, nasal congestion, cough, and sneezing has been present over the past few days. Exam and symptoms are most consistent with seasonal allergies. She will be given a prescription for cetirizine, which it looks like she has been taking in the past but states that she is not taking it now. She was encouraged to follow up with her primary care provider for symptoms are not improving over the next several days. She was instructed to return to the emergency department for symptoms that change or worsen if unable to schedule appointment.  Pertinent labs & imaging results that were available during my care of the patient were reviewed by me and  considered in my medical decision making (see chart for details).  Discharge Medication List as of 12/22/2016  6:54 PM      If controlled substance prescribed during this visit, 12 month history viewed on the [place] prior to issuing an initial prescription for Schedule II or III opiod. ____________________________________________   FINAL CLINICAL IMPRESSION(S) / ED DIAGNOSES  Final diagnoses:  Pharyngitis, unspecified etiology  Seasonal allergic rhinitis, unspecified trigger    Note:  This document was prepared using Dragon voice recognition software and may include unintentional dictation errors.     Victorino Dike, FNP 12/22/16 [place], [place], MD 12/22/16 [phone]

## 2016-12-22 NOTE — ED Triage Notes (Signed)
Pt reports having had a sore throat and congestion for the past week. Pt reports she has GERD and stopped taking her Nexium this past week. Pt has restarted medication but has not had improvement and is not having pain in throat when coughing and sneezing.

## 2016-12-22 NOTE — ED Notes (Signed)
See triage note  States she developed sore throat last Monday   States she thought is was d/t her not taking her nexium   She started back but ss' have not gotten any better  No fever  But has had nasal congestion and cough

## 2017-01-06 ENCOUNTER — Emergency Department
Admission: EM | Admit: 2017-01-06 | Discharge: 2017-01-06 | Disposition: A | Discharge status: Home / Self Care | Payer: Self-pay | Attending: Emergency Medicine | Admitting: Emergency Medicine

## 2017-01-06 ENCOUNTER — Encounter: Payer: Self-pay | Admitting: Emergency Medicine

## 2017-01-06 ENCOUNTER — Emergency Department: Payer: Self-pay

## 2017-01-06 DIAGNOSIS — J209 Acute bronchitis, unspecified: Secondary | ICD-10-CM | POA: Insufficient documentation

## 2017-01-06 DIAGNOSIS — I2510 Atherosclerotic heart disease of native coronary artery without angina pectoris: Secondary | ICD-10-CM | POA: Insufficient documentation

## 2017-01-06 DIAGNOSIS — Z79899 Other long term (current) drug therapy: Secondary | ICD-10-CM | POA: Insufficient documentation

## 2017-01-06 DIAGNOSIS — Z7982 Long term (current) use of aspirin: Secondary | ICD-10-CM | POA: Insufficient documentation

## 2017-01-06 DIAGNOSIS — I10 Essential (primary) hypertension: Secondary | ICD-10-CM | POA: Insufficient documentation

## 2017-01-06 DIAGNOSIS — Z8673 Personal history of transient ischemic attack (TIA), and cerebral infarction without residual deficits: Secondary | ICD-10-CM | POA: Insufficient documentation

## 2017-01-06 MED ORDER — AZITHROMYCIN 250 MG PO TABS: ORAL_TABLET | ORAL | 0 refills | Status: DC

## 2017-01-06 MED ORDER — FLUCONAZOLE 150 MG PO TABS: 150.0000 mg | ORAL_TABLET | Freq: Every day | ORAL | 2 refills | Status: AC

## 2017-01-06 NOTE — ED Triage Notes (Signed)
Pt to ED from home c/o cough and sore throat x1 week.  Non-productive cough.

## 2017-01-06 NOTE — ED Provider Notes (Signed)
[facility] Emergency Department Provider Note  ____________________________________________  Time seen: Approximately 9:18 PM  I have reviewed the triage vital signs and the nursing notes.   HISTORY  Chief Complaint Sore Throat and Cough    HPI Destiny [name] is a 61 y.o. female present to the emergency department with productive cough and congestion for one week. Patient describes symptoms consistent with a viral upper respiratory tract infection approximately one week ago. She denies purulent sputum production. Patient states that when she coughs, she also experiences pharyngitis. Patient denies dysphagia. She denies associated chest pain, chest tightness, shortness of breath, nausea, vomiting and abdominal pain. She has been afebrile. Patient has been taking Zyrtec. She takes Nexium daily for GERD as directed. Patient denies worsening of cough with supine position or after meals.   Past Medical History:  Diagnosis Date  . CAD (coronary artery disease)   . CVA (cerebral infarction)   . Female bladder prolapse   . GERD (gastroesophageal reflux disease)   . Hyperlipidemia   . Hypertension   . Mitral valve disease   . Sleep apnea   . Stroke [facility])    x3    Patient Active Problem List   Diagnosis Date Noted  . Chest pain 02/14/2016  . Small bowel obstruction ([place]) 06/16/2015    Past Surgical History:  Procedure Laterality Date  . CARPAL TUNNEL RELEASE Bilateral   . CHOLECYSTECTOMY    . COLON SURGERY    . TENNIS ELBOW RELEASE/NIRSCHEL PROCEDURE Left   . TONSILLECTOMY      Prior to Admission medications   Medication Sig Start Date End Date Taking? Authorizing Provider  acetaminophen (TYLENOL) 500 MG tablet Take 1,000 mg by mouth every 6 (six) hours as needed for mild pain or moderate pain.    [provider]  aspirin EC 81 MG tablet Take 81 mg by mouth every morning.     [provider]  azithromycin (ZITHROMAX) 250 MG tablet  Take 2 tablets daily the first day. Take 1 tablet daily for the next 4 days. 01/06/17   Lannie Fields, PA-C  benzonatate (TESSALON PERLES) 100 MG capsule Take 1 capsule (100 mg total) by mouth 3 (three) times daily as needed for cough (Take 1-2 per dose). 09/11/16   Menshew, Dannielle Karvonen, PA-C  brompheniramine-pseudoephedrine-DM 30-2-10 MG/5ML syrup Take 5 mLs by mouth 4 (four) times daily as needed. 08/01/16   Sable Feil, PA-C  cetirizine (ZYRTEC) 10 MG tablet Take 1 tablet (10 mg total) by mouth daily. 12/22/16   Triplett, Johnette Abraham B, FNP  chlorpheniramine-HYDROcodone (TUSSIONEX PENNKINETIC ER) 10-8 MG/5ML SUER Take 5 mLs by mouth 2 (two) times daily. 08/09/16   Sable Feil, PA-C  Cholecalciferol (VITAMIN D3) 5000 UNITS CAPS Take 5,000 Units by mouth daily.    [provider]  conjugated estrogens (PREMARIN) vaginal cream Place 1 Applicatorful vaginally at bedtime as needed (for vaginal health). At bedtime     [provider]  cyclobenzaprine (FLEXERIL) 10 MG tablet Take 1 tablet (10 mg total) by mouth 2 (two) times daily as needed for muscle spasms. 03/18/16   Ivin Booty, MD  diclofenac sodium (VOLTAREN) 1 % GEL Apply 2 g topically 4 (four) times daily as needed (for pain).     [provider]  esomeprazole (NEXIUM) 40 MG capsule Take 40 mg by mouth 2 (two) times daily.    [provider]  fluconazole (DIFLUCAN) 150 MG tablet Take 1 tablet (150 mg total) by  mouth daily. 01/06/17 01/07/17  Lannie Fields, PA-C  fluticasone (FLONASE) 50 MCG/ACT nasal spray Place 2 sprays into both nostrils daily. 09/11/16   Menshew, Dannielle Karvonen, PA-C  furosemide (LASIX) 20 MG tablet Take 1 tablet (20 mg total) by mouth every other day. 02/15/16   Hillary Bow, MD  gabapentin (NEURONTIN) 600 MG tablet Take 1,800 mg by mouth at bedtime.     [provider]  hydrocortisone cream 0.5 % Apply 1 application topically daily as needed for itching.     [provider]   ibuprofen (ADVIL,MOTRIN) 600 MG tablet Take 1 tablet (600 mg total) by mouth every 8 (eight) hours as needed. 08/01/16   Sable Feil, PA-C  methylPREDNISolone (MEDROL DOSEPAK) 4 MG TBPK tablet Take Tapered dose as directed 08/09/16   Sable Feil, PA-C  metoprolol tartrate (LOPRESSOR) 25 MG tablet Take 0.5 tablets (12.5 mg total) by mouth 2 (two) times daily. 02/15/16   Hillary Bow, MD  nitroGLYCERIN (NITROSTAT) 0.4 MG SL tablet Place 0.4 mg under the tongue every 5 (five) minutes as needed for chest pain.    [provider]  nystatin cream (MYCOSTATIN) Apply 1 application topically daily as needed for dry skin.     [provider]  potassium chloride SA (K-DUR,KLOR-CON) 20 MEQ tablet Take 20 mEq by mouth daily.    [provider]  rosuvastatin (CRESTOR) 20 MG tablet Take 20 mg by mouth every evening.     [provider]  ticagrelor (BRILINTA) 90 MG TABS tablet Take 90 mg by mouth 2 (two) times daily.    [provider]    Allergies Atorvastatin; Diflucan [fluconazole]; and Sertraline  Family History  Problem Relation Age of Onset  . Heart disease Other   . Hypertension Other     Social History Social History  Substance Use Topics  . Smoking status: Never Smoker  . Smokeless tobacco: Never Used  . Alcohol use No     Review of Systems  Constitutional: No fever/chills Eyes: No visual changes. No discharge ENT: No upper respiratory complaints. Cardiovascular: no chest pain. Respiratory: Patient has productive cough. No SOB. Musculoskeletal: Negative for musculoskeletal pain. Skin: Negative for rash, abrasions, lacerations, ecchymosis. Neurological: Negative for headaches, focal weakness or numbness.   ____________________________________________   PHYSICAL EXAM:  VITAL SIGNS: ED Triage Vitals  Enc Vitals Group     BP 01/06/17 1637 101/81     Pulse Rate 01/06/17 1637 74     Resp 01/06/17 1637 20     Temp 01/06/17 1637  98.2 F (36.8 C)     Temp Source 01/06/17 1637 Oral     SpO2 01/06/17 1637 97 %     Weight --      Height --      Head Circumference --      Peak Flow --      Pain Score 01/06/17 1648 8     Pain Loc --      Pain Edu? --      Excl. in [place]? --      Constitutional: Alert and oriented. Well appearing and in no acute distress. Eyes: Conjunctivae are normal. PERRL. EOMI. Head: Atraumatic. ENT:      Ears: Tymanic membranes are pearly bilaterally.      Nose: No congestion/rhinnorhea.      Mouth/Throat: Mucous membranes are moist.  Neck: FROM.  Hematological/Lymphatic/Immunilogical: No cervical lymphadenopathy.  Cardiovascular: Normal rate, regular rhythm. Normal S1 and S2.  Good peripheral circulation.  Respiratory: Normal respiratory effort without tachypnea or retractions. Lungs CTAB. Good air entry to the bases with no decreased or absent breath sounds. Musculoskeletal: Full range of motion to all extremities. No gross deformities appreciated. Neurologic:  Normal speech and language. No gross focal neurologic deficits are appreciated.  Skin:  Skin is warm, dry and intact. No rash noted. Psychiatric: Mood and affect are normal. Speech and behavior are normal. Patient exhibits appropriate insight and judgement.   ____________________________________________   LABS (all labs ordered are listed, but only abnormal results are displayed)  Labs Reviewed - No data to display ____________________________________________  EKG   ____________________________________________  RADIOLOGY Unk Pinto, personally viewed and evaluated these images (plain radiographs) as part of my medical decision making, as well as reviewing the written report by the radiologist.  Dg Chest 2 View  Result Date: 01/06/2017 CLINICAL DATA:  Patient has a dry cough x 1 week that burns her throat and hurts across her chest. Patient denies being around anyone sick. EXAM: CHEST  2 VIEW COMPARISON:  09/11/2016  FINDINGS: Cardiac silhouette is normal size. No mediastinal or hilar masses. No evidence of adenopathy. Clear lungs.  No pleural effusion or pneumothorax. Skeletal structures are demineralized but intact. IMPRESSION: No active cardiopulmonary disease. Electronically Signed   By: Lajean Manes M.D.   On: 01/06/2017 18:33    ____________________________________________    PROCEDURES  Procedure(s) performed:    Procedures    Medications - No data to display   ____________________________________________   INITIAL IMPRESSION / ASSESSMENT AND PLAN / ED COURSE  Pertinent labs & imaging results that were available during my care of the patient were reviewed by me and considered in my medical decision making (see chart for details).  Review of the [place] CSRS was performed in accordance of the [place] prior to dispensing any controlled drugs.     Assessment and plan: Acute bronchitis: Patient presents to the emergency department with productive cough and congestion for the past week. DG chest reveals no consolidations or findings consistent with pneumonia. Given history of recent upper respiratory tract infection, acute bronchitis is likely. Patient was discharged with azithromycin and diflucan. Vital signs were reassuring prior to discharge. Patient was advised to follow-up with primary care as needed. All patient questions were answered.    ____________________________________________  FINAL CLINICAL IMPRESSION(S) / ED DIAGNOSES  Final diagnoses:  Acute bronchitis, unspecified organism      NEW MEDICATIONS STARTED DURING THIS VISIT:  Discharge Medication List as of 01/06/2017  7:01 PM    START taking these medications   Details  azithromycin (ZITHROMAX) 250 MG tablet Take 2 tablets daily the first day. Take 1 tablet daily for the next 4 days., Print    fluconazole (DIFLUCAN) 150 MG tablet Take 1 tablet (150 mg total) by mouth daily., Starting Fri 01/06/2017, Until Sat  01/07/2017, Print            This chart was dictated using voice recognition software/Dragon. Despite best efforts to proofread, errors can occur which can change the meaning. Any change was purely unintentional.    Karren Cobble 01/06/17 2126    Carrie Mew, MD 01/09/17 [phone]

## 2017-01-06 NOTE — ED Notes (Signed)
Patient has a dry cough that burns her throat and hurts across her chest. Patient denies being around anyone sick.

## 2017-01-28 ENCOUNTER — Encounter: Payer: Self-pay | Admitting: Emergency Medicine

## 2017-01-28 ENCOUNTER — Emergency Department: Payer: Self-pay

## 2017-01-28 ENCOUNTER — Emergency Department
Admission: EM | Admit: 2017-01-28 | Discharge: 2017-01-28 | Disposition: A | Discharge status: Home / Self Care | Payer: Self-pay | Attending: Emergency Medicine | Admitting: Emergency Medicine

## 2017-01-28 DIAGNOSIS — I2510 Atherosclerotic heart disease of native coronary artery without angina pectoris: Secondary | ICD-10-CM | POA: Insufficient documentation

## 2017-01-28 DIAGNOSIS — R21 Rash and other nonspecific skin eruption: Secondary | ICD-10-CM | POA: Insufficient documentation

## 2017-01-28 DIAGNOSIS — K30 Functional dyspepsia: Secondary | ICD-10-CM | POA: Insufficient documentation

## 2017-01-28 DIAGNOSIS — I10 Essential (primary) hypertension: Secondary | ICD-10-CM | POA: Insufficient documentation

## 2017-01-28 DIAGNOSIS — Z79899 Other long term (current) drug therapy: Secondary | ICD-10-CM | POA: Insufficient documentation

## 2017-01-28 DIAGNOSIS — M79605 Pain in left leg: Secondary | ICD-10-CM | POA: Insufficient documentation

## 2017-01-28 DIAGNOSIS — M79604 Pain in right leg: Secondary | ICD-10-CM | POA: Insufficient documentation

## 2017-01-28 DIAGNOSIS — R079 Chest pain, unspecified: Secondary | ICD-10-CM | POA: Insufficient documentation

## 2017-01-28 LAB — COMPREHENSIVE METABOLIC PANEL
ALBUMIN: 4.1 g/dL (ref 3.5–5.0)
ALT: 20 U/L (ref 14–54)
AST: 23 U/L (ref 15–41)
Alkaline Phosphatase: 99 U/L (ref 38–126)
Anion gap: 8 (ref 5–15)
BUN: 15 mg/dL (ref 6–20)
CHLORIDE: 102 mmol/L (ref 101–111)
CO2: 29 mmol/L (ref 22–32)
Calcium: 9.3 mg/dL (ref 8.9–10.3)
Creatinine, Ser: 0.8 mg/dL (ref 0.44–1.00)
GFR calc Af Amer: >60 mL/min (ref >60)
GLUCOSE: 97 mg/dL (ref 65–99)
POTASSIUM: 3.8 mmol/L (ref 3.5–5.1)
SODIUM: 139 mmol/L (ref 135–145)
Total Bilirubin: 0.8 mg/dL (ref 0.3–1.2)
Total Protein: 7.6 g/dL (ref 6.5–8.1)

## 2017-01-28 LAB — BRAIN NATRIURETIC PEPTIDE: B NATRIURETIC PEPTIDE 5: 17 pg/mL (ref 0.0–100.0)

## 2017-01-28 LAB — URINALYSIS, COMPLETE (UACMP) WITH MICROSCOPIC
Bilirubin Urine: NEGATIVE
GLUCOSE, UA: NEGATIVE mg/dL
KETONES UR: NEGATIVE mg/dL
NITRITE: NEGATIVE
Protein, ur: NEGATIVE mg/dL
Specific Gravity, Urine: 1.012 (ref 1.005–1.030)
pH: 6 (ref 5.0–8.0)

## 2017-01-28 LAB — CBC WITH DIFFERENTIAL/PLATELET
BASOS PCT: 1 %
Basophils Absolute: 0 10*3/uL (ref 0–0.1)
Eosinophils Absolute: 0.1 10*3/uL (ref 0–0.7)
Eosinophils Relative: 1 %
HEMATOCRIT: 37 % (ref 35.0–47.0)
HEMOGLOBIN: 12.7 g/dL (ref 12.0–16.0)
LYMPHS ABS: 1.7 10*3/uL (ref 1.0–3.6)
LYMPHS PCT: 25 %
MCH: 31.4 pg (ref 26.0–34.0)
MCHC: 34.3 g/dL (ref 32.0–36.0)
MCV: 91.6 fL (ref 80.0–100.0)
MONO ABS: 0.6 10*3/uL (ref 0.2–0.9)
MONOS PCT: 9 %
NEUTROS ABS: 4.5 10*3/uL (ref 1.4–6.5)
NEUTROS PCT: 64 %
Platelets: 187 10*3/uL (ref 150–440)
RBC: 4.04 MIL/uL (ref 3.80–5.20)
RDW: 13.7 % (ref 11.5–14.5)
WBC: 7 10*3/uL (ref 3.6–11.0)

## 2017-01-28 LAB — TROPONIN I: Troponin I: <0.03 ng/mL (ref <0.03)

## 2017-01-28 MED ORDER — FLUCONAZOLE 100 MG PO TABS: 100.0000 mg | ORAL_TABLET | Freq: Every day | ORAL | 0 refills | Status: AC

## 2017-01-28 MED ORDER — SULFAMETHOXAZOLE-TRIMETHOPRIM 800-160 MG PO TABS: 1.0000 | ORAL_TABLET | Freq: Two times a day (BID) | ORAL | 0 refills | Status: DC

## 2017-01-28 MED ORDER — FLUCONAZOLE 100 MG PO TABS: 100.0000 mg | ORAL_TABLET | Freq: Every day | ORAL | 0 refills | Status: DC

## 2017-01-28 NOTE — ED Notes (Signed)
Pt states she awoke with a brown spot on the palm of her hand and washed her hands numerous times and states she still sees a faint brownish spot. Palms compared - no discoloration noted. Pt denies pain, numbness or tingling. Pt states she does not see brown on any other part of her body.

## 2017-01-28 NOTE — ED Triage Notes (Addendum)
Pt reports she woke up this morning with brown on her fingers, pt states she tried to wash her hands multiple times states she was able to clean her fingers, but states she cannot get the brown off of her hand. Triage RN does not see any brown at this time. Pt states it is only on her right hand and is lessened in color at this time.

## 2017-01-28 NOTE — ED Provider Notes (Signed)
[facility] Emergency Department Provider Note   ____________________________________________   I have reviewed the triage vital signs and the nursing notes.   HISTORY  Chief Complaint Hand Problem    HPI Destiny [name] is a 61 y.o. female patient's the emergency department with complaints of brown staining along the palmar aspect of the right hand. Patient reported she woke up with discoloration this morning. She was able to wash majority of the discoloration off and she still noted a small area of the brownish tint in the palm of her right hand. Patient reported when she went to sleep she did not have the discoloration on her hands. Patient did not notice the brownish staining anywhere else on her body. Patient also reports chest pressure, chest tightness and increasing episodes of acid reflux over the last week. Patient has significant cardiac history to include 2 stent placements 3 years ago, hypertension, high cholesterol. Patient states she takes Nexium for GERD which is usually effective however current symptoms have not subsided but her normal dose of Nexium. Patient is followed rectally by her PCP and cardiologist. Patient denies fever, chills, headache, vision changes, shortness of breath, abdominal pain, nausea and vomiting.  Past Medical History:  Diagnosis Date  . CAD (coronary artery disease)   . CVA (cerebral infarction)   . Female bladder prolapse   . GERD (gastroesophageal reflux disease)   . Hyperlipidemia   . Hypertension   . Mitral valve disease   . Sleep apnea   . Stroke [facility])    x3    Patient Active Problem List   Diagnosis Date Noted  . Chest pain 02/14/2016  . Small bowel obstruction ([place]) 06/16/2015    Past Surgical History:  Procedure Laterality Date  . CARPAL TUNNEL RELEASE Bilateral   . CHOLECYSTECTOMY    . COLON SURGERY    . TENNIS ELBOW RELEASE/NIRSCHEL PROCEDURE Left   . TONSILLECTOMY      Prior to Admission  medications   Medication Sig Start Date End Date Taking? Authorizing Provider  acetaminophen (TYLENOL) 500 MG tablet Take 1,000 mg by mouth every 6 (six) hours as needed for mild pain or moderate pain.    [provider]  aspirin EC 81 MG tablet Take 81 mg by mouth every morning.     [provider]  azithromycin (ZITHROMAX) 250 MG tablet Take 2 tablets daily the first day. Take 1 tablet daily for the next 4 days. 01/06/17   Lannie Fields, PA-C  benzonatate (TESSALON PERLES) 100 MG capsule Take 1 capsule (100 mg total) by mouth 3 (three) times daily as needed for cough (Take 1-2 per dose). 09/11/16   Menshew, Dannielle Karvonen, PA-C  brompheniramine-pseudoephedrine-DM 30-2-10 MG/5ML syrup Take 5 mLs by mouth 4 (four) times daily as needed. 08/01/16   Sable Feil, PA-C  cetirizine (ZYRTEC) 10 MG tablet Take 1 tablet (10 mg total) by mouth daily. 12/22/16   Triplett, Johnette Abraham B, FNP  chlorpheniramine-HYDROcodone (TUSSIONEX PENNKINETIC ER) 10-8 MG/5ML SUER Take 5 mLs by mouth 2 (two) times daily. 08/09/16   Sable Feil, PA-C  Cholecalciferol (VITAMIN D3) 5000 UNITS CAPS Take 5,000 Units by mouth daily.    [provider]  conjugated estrogens (PREMARIN) vaginal cream Place 1 Applicatorful vaginally at bedtime as needed (for vaginal health). At bedtime     [provider]  cyclobenzaprine (FLEXERIL) 10 MG tablet Take 1 tablet (10 mg total) by mouth 2 (two) times daily as needed for muscle  spasms. 03/18/16   Ivin Booty, MD  diclofenac sodium (VOLTAREN) 1 % GEL Apply 2 g topically 4 (four) times daily as needed (for pain).     [provider]  esomeprazole (NEXIUM) 40 MG capsule Take 40 mg by mouth 2 (two) times daily.    [provider]  fluconazole (DIFLUCAN) 100 MG tablet Take 1 tablet (100 mg total) by mouth daily. 01/28/17 01/30/17  Little, Traci M, PA-C  fluticasone (FLONASE) 50 MCG/ACT nasal spray Place 2 sprays into both nostrils daily. 09/11/16    Menshew, Dannielle Karvonen, PA-C  furosemide (LASIX) 20 MG tablet Take 1 tablet (20 mg total) by mouth every other day. 02/15/16   Hillary Bow, MD  gabapentin (NEURONTIN) 600 MG tablet Take 1,800 mg by mouth at bedtime.     [provider]  hydrocortisone cream 0.5 % Apply 1 application topically daily as needed for itching.     [provider]  ibuprofen (ADVIL,MOTRIN) 600 MG tablet Take 1 tablet (600 mg total) by mouth every 8 (eight) hours as needed. 08/01/16   Sable Feil, PA-C  methylPREDNISolone (MEDROL DOSEPAK) 4 MG TBPK tablet Take Tapered dose as directed 08/09/16   Sable Feil, PA-C  metoprolol tartrate (LOPRESSOR) 25 MG tablet Take 0.5 tablets (12.5 mg total) by mouth 2 (two) times daily. 02/15/16   Hillary Bow, MD  nitroGLYCERIN (NITROSTAT) 0.4 MG SL tablet Place 0.4 mg under the tongue every 5 (five) minutes as needed for chest pain.    [provider]  nystatin cream (MYCOSTATIN) Apply 1 application topically daily as needed for dry skin.     [provider]  potassium chloride SA (K-DUR,KLOR-CON) 20 MEQ tablet Take 20 mEq by mouth daily.    [provider]  rosuvastatin (CRESTOR) 20 MG tablet Take 20 mg by mouth every evening.     [provider]  sulfamethoxazole-trimethoprim (BACTRIM DS,SEPTRA DS) 800-160 MG tablet Take 1 tablet by mouth 2 (two) times daily. 01/28/17   Little, Traci M, PA-C  ticagrelor (BRILINTA) 90 MG TABS tablet Take 90 mg by mouth 2 (two) times daily.    [provider]    Allergies Atorvastatin; Diflucan [fluconazole]; and Sertraline  Family History  Problem Relation Age of Onset  . Heart disease Other   . Hypertension Other     Social History Social History  Substance Use Topics  . Smoking status: Never Smoker  . Smokeless tobacco: Never Used  . Alcohol use No    Review of Systems Constitutional: Negative for fever/chills Eyes: No visual changes. ENT:  Negative for sore  throat and for difficulty swallowing Cardiovascular: Positive for chest pressure and discomfort. Increase swelling in the lower extremities right greater than left. Respiratory: Denies cough. Denies shortness of breath. Gastrointestinal: No abdominal pain.  No nausea, vomiting, diarrhea. Genitourinary: Negative for dysuria. Positive for irritation. Musculoskeletal: Negative for back pain. Negative for generalized body aches. Skin: Positive for light brown rash on the palm of the right hand. Neurological: Negative for headaches. Able to ambulate. ____________________________________________   PHYSICAL EXAM:  VITAL SIGNS: ED Triage Vitals  Enc Vitals Group     BP 01/28/17 1218 126/71     Pulse Rate 01/28/17 1218 66     Resp 01/28/17 1218 20     Temp 01/28/17 1218 98.9 F (37.2 C)     Temp Source 01/28/17 1218 Oral     SpO2 01/28/17 1218 95 %     Weight 01/28/17 1218 202 lb (  91.6 kg)     Height 01/28/17 1218 5\' 2"  (1.575 m)     Head Circumference --      Peak Flow --      Pain Score 01/28/17 1231 0     Pain Loc --      Pain Edu? --      Excl. in [place]? --     Constitutional: Alert and oriented. Well appearing and in no acute distress.  Head: Normocephalic and atraumatic. Eyes: Conjunctivae are normal. PERRL.  Nose: No congestion/rhinorrhea Mouth/Throat: Mucous membranes are moist. Neck: Supple. Cardiovascular: Normal rate, regular rhythm. Normal distal pulses. +1 pitting edema in the right lower extremity to the mid calf. Notable swelling in the left lower extremity just above the ankle, nonpitting Respiratory: Normal respiratory effort. No wheezes/rales/rhonchi. Lungs CTAB with no W/R/R. Gastrointestinal: Soft and nontender. No distention. Genitourinary: Negative for dysuria. Positive for irritation. Negative for increased urinary urgency. Musculoskeletal: Nontender with normal range of motion in all extremities. Neurologic: Normal speech and language. No gross focal neurologic  deficits are appreciated. No gait instability. Cranial nerves: II-X intact. No sensory loss or abnormal reflexes.  Skin:  Skin is warm, dry and intact. No rash noted. Psychiatric: Mood and affect are normal.  ____________________________________________   LABS (all labs ordered are listed, but only abnormal results are displayed)  Labs Reviewed  URINALYSIS, COMPLETE (UACMP) WITH MICROSCOPIC - Abnormal; Notable for the following:       Result Value   Color, Urine YELLOW (*)    APPearance CLOUDY (*)    Hgb urine dipstick SMALL (*)    Leukocytes, UA LARGE (*)    Bacteria, UA RARE (*)    Squamous Epithelial / LPF 0-5 (*)    All other components within normal limits  COMPREHENSIVE METABOLIC PANEL  BRAIN NATRIURETIC PEPTIDE  TROPONIN I  CBC WITH DIFFERENTIAL/PLATELET   ____________________________________________  EKG 12-Lead EKG NSR, Unremarkable for acute ST changes ____________________________________________  RADIOLOGY DG chest FINDINGS: The heart size is normal. There significant edema or effusion is present. There is no focal airspace disease. The lungs are hyperinflated. Exaggerated kyphosis in rightward curvature is present in the thoracic spine. The visualized soft tissues and bony thorax are unremarkable.  IMPRESSION: No acute abnormality.  US venous IMG b/l  IMPRESSION: No evidence of DVT within either lower extremity.  ____________________________________________   PROCEDURES  Procedure(s) performed: no    Critical Care performed: no ____________________________________________   INITIAL IMPRESSION / ASSESSMENT AND PLAN / ED COURSE  Pertinent labs & imaging results that were available during my care of the patient were reviewed by me and considered in my medical decision making (see chart for details).  Patient presents to emergency department with small light brown rash on the palm of the right hand, nonspecific chest pain, increased lower  extremity swelling and urinary symptoms.. History, physical exam findings, labs and imaging are reassuring symptoms are not associated with acute cardiac event or DVT. Labs revealed urinary symptoms are consistent with cysts. The rash on the palm of the right hand do not appear infectious, edematous or associated with any positive findings found during the course of care in emergency department. Recommend patient continue monitoring the area and follow-up with her primary care doctor if the rash persisted or worsened. Patient will be prescribed Bactrim for about coverage for the cystitis and she is also been given Diflucan prophylactically vaginal tendinitis is developed. Patient advised to follow up with PCP and cardiologist as needed or return to the  emergency department if symptoms return or worsen.    ____________________________________________   FINAL CLINICAL IMPRESSION(S) / ED DIAGNOSES  Final diagnoses:  Leg pain, bilateral  Chest pain, unspecified type  Indigestion  Rash of hands       NEW MEDICATIONS STARTED DURING THIS VISIT:  Discharge Medication List as of 01/28/2017  5:25 PM    START taking these medications   Details  fluconazole (DIFLUCAN) 100 MG tablet Take 1 tablet (100 mg total) by mouth daily., Starting Sat 01/28/2017, Until Mon 01/30/2017, Print    sulfamethoxazole-trimethoprim (BACTRIM DS,SEPTRA DS) 800-160 MG tablet Take 1 tablet by mouth 2 (two) times daily., Starting Sat 01/28/2017, Print         Note:  This document was prepared using Dragon voice recognition software and may include unintentional dictation errors.    Jerolyn Shin, PA-C 01/29/17 1004    Delman Kitten, MD 02/03/17 [phone]

## 2017-01-28 NOTE — Discharge Instructions (Signed)
Follow-up with her primary care doctor regarding hand rash and follow up with your cardiologist as needed.

## 2017-01-31 ENCOUNTER — Emergency Department
Admission: EM | Admit: 2017-01-31 | Discharge: 2017-01-31 | Disposition: A | Discharge status: Home / Self Care | Payer: Self-pay | Attending: Emergency Medicine | Admitting: Emergency Medicine

## 2017-01-31 ENCOUNTER — Encounter: Payer: Self-pay | Admitting: Emergency Medicine

## 2017-01-31 DIAGNOSIS — I2510 Atherosclerotic heart disease of native coronary artery without angina pectoris: Secondary | ICD-10-CM | POA: Insufficient documentation

## 2017-01-31 DIAGNOSIS — I10 Essential (primary) hypertension: Secondary | ICD-10-CM | POA: Insufficient documentation

## 2017-01-31 DIAGNOSIS — T7840XA Allergy, unspecified, initial encounter: Secondary | ICD-10-CM

## 2017-01-31 DIAGNOSIS — Y658 Other specified misadventures during surgical and medical care: Secondary | ICD-10-CM | POA: Insufficient documentation

## 2017-01-31 DIAGNOSIS — Z79899 Other long term (current) drug therapy: Secondary | ICD-10-CM | POA: Insufficient documentation

## 2017-01-31 DIAGNOSIS — T887XXA Unspecified adverse effect of drug or medicament, initial encounter: Secondary | ICD-10-CM | POA: Insufficient documentation

## 2017-01-31 DIAGNOSIS — T370X5A Adverse effect of sulfonamides, initial encounter: Secondary | ICD-10-CM | POA: Insufficient documentation

## 2017-01-31 MED ORDER — CIPROFLOXACIN HCL 250 MG PO TABS: 250.0000 mg | ORAL_TABLET | Freq: Two times a day (BID) | ORAL | 0 refills | Status: AC

## 2017-01-31 MED ORDER — METHYLPREDNISOLONE SODIUM SUCC 125 MG IJ SOLR
125.0000 mg | Freq: Once | INTRAMUSCULAR | Status: AC
  Administered 2017-01-31: 125 mg via INTRAMUSCULAR
  Filled 2017-01-31: qty 2

## 2017-01-31 MED ORDER — CIPROFLOXACIN HCL 500 MG PO TABS
500.0000 mg | ORAL_TABLET | Freq: Once | ORAL | Status: AC
  Administered 2017-01-31: 500 mg via ORAL
  Filled 2017-01-31: qty 1

## 2017-01-31 NOTE — ED Triage Notes (Signed)
Pt to ed with c/o ? Allergic reaction. Pt states she started bactrim yesterday for UTI, and then this am awoke with a "hot sensation"  Pt states she thought she might be having a hot flash but that it has not gone away and she is concerned she may be having an allergic reaction. Pt denies difficulty breathing, appears in no distress and denies rash.

## 2017-01-31 NOTE — ED Provider Notes (Signed)
[facility] Emergency Department Provider Note   ____________________________________________   None    (approximate)  I have reviewed the triage vital signs and the nursing notes.   HISTORY  Chief Complaint Allergic Reaction    HPI Destiny [name] is a 61 y.o. female patient complain of having hot flash to body after second dose of Bactrim for UTI. Patient state there is also itchy sensation throughout her body. Patient has not noticed a rash.Patient denies pain. Patient denies history of sulfa allergies. No palliative measures for complaint.   Past Medical History:  Diagnosis Date  . CAD (coronary artery disease)   . CVA (cerebral infarction)   . Female bladder prolapse   . GERD (gastroesophageal reflux disease)   . Hyperlipidemia   . Hypertension   . Mitral valve disease   . Sleep apnea   . Stroke [facility])    x3    Patient Active Problem List   Diagnosis Date Noted  . Chest pain 02/14/2016  . Small bowel obstruction ([place]) 06/16/2015    Past Surgical History:  Procedure Laterality Date  . CARPAL TUNNEL RELEASE Bilateral   . CHOLECYSTECTOMY    . COLON SURGERY    . TENNIS ELBOW RELEASE/NIRSCHEL PROCEDURE Left   . TONSILLECTOMY      Prior to Admission medications   Medication Sig Start Date End Date Taking? Authorizing Provider  acetaminophen (TYLENOL) 500 MG tablet Take 1,000 mg by mouth every 6 (six) hours as needed for mild pain or moderate pain.    [provider]  aspirin EC 81 MG tablet Take 81 mg by mouth every morning.     [provider]  azithromycin (ZITHROMAX) 250 MG tablet Take 2 tablets daily the first day. Take 1 tablet daily for the next 4 days. 01/06/17   Lannie Fields, PA-C  benzonatate (TESSALON PERLES) 100 MG capsule Take 1 capsule (100 mg total) by mouth 3 (three) times daily as needed for cough (Take 1-2 per dose). 09/11/16   Menshew, Dannielle Karvonen, PA-C  brompheniramine-pseudoephedrine-DM 30-2-10  MG/5ML syrup Take 5 mLs by mouth 4 (four) times daily as needed. 08/01/16   Sable Feil, PA-C  cetirizine (ZYRTEC) 10 MG tablet Take 1 tablet (10 mg total) by mouth daily. 12/22/16   Triplett, Johnette Abraham B, FNP  chlorpheniramine-HYDROcodone (TUSSIONEX PENNKINETIC ER) 10-8 MG/5ML SUER Take 5 mLs by mouth 2 (two) times daily. 08/09/16   Sable Feil, PA-C  Cholecalciferol (VITAMIN D3) 5000 UNITS CAPS Take 5,000 Units by mouth daily.    [provider]  ciprofloxacin (CIPRO) 250 MG tablet Take 1 tablet (250 mg total) by mouth 2 (two) times daily. 01/31/17 02/10/17  Sable Feil, PA-C  conjugated estrogens (PREMARIN) vaginal cream Place 1 Applicatorful vaginally at bedtime as needed (for vaginal health). At bedtime     [provider]  cyclobenzaprine (FLEXERIL) 10 MG tablet Take 1 tablet (10 mg total) by mouth 2 (two) times daily as needed for muscle spasms. 03/18/16   Ivin Booty, MD  diclofenac sodium (VOLTAREN) 1 % GEL Apply 2 g topically 4 (four) times daily as needed (for pain).     [provider]  esomeprazole (NEXIUM) 40 MG capsule Take 40 mg by mouth 2 (two) times daily.    [provider]  fluticasone (FLONASE) 50 MCG/ACT nasal spray Place 2 sprays into both nostrils daily. 09/11/16   Menshew, Dannielle Karvonen, PA-C  furosemide (LASIX) 20 MG tablet Take 1 tablet (20  mg total) by mouth every other day. 02/15/16   Hillary Bow, MD  gabapentin (NEURONTIN) 600 MG tablet Take 1,800 mg by mouth at bedtime.     [provider]  hydrocortisone cream 0.5 % Apply 1 application topically daily as needed for itching.     [provider]  ibuprofen (ADVIL,MOTRIN) 600 MG tablet Take 1 tablet (600 mg total) by mouth every 8 (eight) hours as needed. 08/01/16   Sable Feil, PA-C  methylPREDNISolone (MEDROL DOSEPAK) 4 MG TBPK tablet Take Tapered dose as directed 08/09/16   Sable Feil, PA-C  metoprolol tartrate (LOPRESSOR) 25 MG tablet Take 0.5 tablets  (12.5 mg total) by mouth 2 (two) times daily. 02/15/16   Hillary Bow, MD  nitroGLYCERIN (NITROSTAT) 0.4 MG SL tablet Place 0.4 mg under the tongue every 5 (five) minutes as needed for chest pain.    [provider]  nystatin cream (MYCOSTATIN) Apply 1 application topically daily as needed for dry skin.     [provider]  potassium chloride SA (K-DUR,KLOR-CON) 20 MEQ tablet Take 20 mEq by mouth daily.    [provider]  rosuvastatin (CRESTOR) 20 MG tablet Take 20 mg by mouth every evening.     [provider]  sulfamethoxazole-trimethoprim (BACTRIM DS,SEPTRA DS) 800-160 MG tablet Take 1 tablet by mouth 2 (two) times daily. 01/28/17   Little, Traci M, PA-C  ticagrelor (BRILINTA) 90 MG TABS tablet Take 90 mg by mouth 2 (two) times daily.    [provider]    Allergies Atorvastatin; Diflucan [fluconazole]; Sertraline; and Sulfa antibiotics  Family History  Problem Relation Age of Onset  . Heart disease Other   . Hypertension Other     Social History Social History  Substance Use Topics  . Smoking status: Never Smoker  . Smokeless tobacco: Never Used  . Alcohol use No    Review of Systems  Constitutional: No fever/chills Eyes: No visual changes. ENT: No sore throat. Cardiovascular: Denies chest pain. Respiratory: Denies shortness of breath. Gastrointestinal: No abdominal pain.  No nausea, no vomiting.  No diarrhea.  No constipation. Genitourinary: Negative for dysuria. Musculoskeletal: Negative for back pain. Skin: Negative for rash. Neurological: Negative for headaches, focal weakness or numbness. Endocrine:Hyperlipidemia and hypertension Allergic/Immunilogical: See medication list  ____________________________________________   PHYSICAL EXAM:  VITAL SIGNS: ED Triage Vitals  Enc Vitals Group     BP 01/31/17 0728 129/69     Pulse Rate 01/31/17 0728 72     Resp 01/31/17 0728 16     Temp 01/31/17 0728 98.2 F (36.8 C)      Temp Source 01/31/17 0728 Oral     SpO2 01/31/17 0728 100 %     Weight --      Height --      Head Circumference --      Peak Flow --      Pain Score 01/31/17 0727 0     Pain Loc --      Pain Edu? --      Excl. in [place]? --     Constitutional: Alert and oriented. Well appearing and in no acute distress. Anxious Cardiovascular: Normal rate, regular rhythm. Grossly normal heart sounds.  Good peripheral circulation. Respiratory: Normal respiratory effort.  No retractions. Lungs CTAB. Gastrointestinal: Soft and nontender. No distention. No abdominal bruits. No CVA tenderness. Musculoskeletal: No lower extremity tenderness nor edema.  No joint effusions. Neurologic:  Normal speech and language. No gross focal neurologic deficits are appreciated. No  gait instability. Skin:  Skin is warm, dry and intact. No rash noted. Psychiatric: Anxious. Speech and behavior are normal.   ____________________________________________   LABS (all labs ordered are listed, but only abnormal results are displayed)  Labs Reviewed - No data to display ____________________________________________  EKG   ____________________________________________  RADIOLOGY  No results found.  ____________________________________________   PROCEDURES  Procedure(s) performed: None  Procedures  Critical Care performed: No  ____________________________________________   INITIAL IMPRESSION / ASSESSMENT AND PLAN / ED COURSE  Pertinent labs & imaging results that were available during my care of the patient were reviewed by me and considered in my medical decision making (see chart for details).  Drug reaction. Advised to discontinue Bactrim and notify prescribing doctor. Patient given a prescription for Cipro and advised to take medication as directed.      ____________________________________________   FINAL CLINICAL IMPRESSION(S) / ED DIAGNOSES  Final diagnoses:  Allergic reaction to drug, initial  encounter      NEW MEDICATIONS STARTED DURING THIS VISIT:  New Prescriptions   CIPROFLOXACIN (CIPRO) 250 MG TABLET    Take 1 tablet (250 mg total) by mouth 2 (two) times daily.     Note:  This document was prepared using Dragon voice recognition software and may include unintentional dictation errors.    Sable Feil, PA-C 01/31/17 0165    Eula Listen, MD 01/31/17 [phone]

## 2017-01-31 NOTE — ED Notes (Signed)
See triage note  Was seen Saturday and placed on septra ds  States she woke up with a "hot sensation" no rash or SOB noted

## 2017-01-31 NOTE — Discharge Instructions (Signed)
Discontinue Bactrim and start Cipro as directed.

## 2017-02-01 ENCOUNTER — Other Ambulatory Visit: Payer: Self-pay

## 2017-02-01 ENCOUNTER — Emergency Department
Admission: EM | Admit: 2017-02-01 | Discharge: 2017-02-01 | Disposition: A | Discharge status: Home / Self Care | Payer: Self-pay | Attending: Emergency Medicine | Admitting: Emergency Medicine

## 2017-02-01 ENCOUNTER — Encounter: Payer: Self-pay | Admitting: Emergency Medicine

## 2017-02-01 DIAGNOSIS — I10 Essential (primary) hypertension: Secondary | ICD-10-CM | POA: Insufficient documentation

## 2017-02-01 DIAGNOSIS — R21 Rash and other nonspecific skin eruption: Secondary | ICD-10-CM | POA: Insufficient documentation

## 2017-02-01 DIAGNOSIS — I2510 Atherosclerotic heart disease of native coronary artery without angina pectoris: Secondary | ICD-10-CM | POA: Insufficient documentation

## 2017-02-01 DIAGNOSIS — Z79899 Other long term (current) drug therapy: Secondary | ICD-10-CM | POA: Insufficient documentation

## 2017-02-01 DIAGNOSIS — T7840XD Allergy, unspecified, subsequent encounter: Secondary | ICD-10-CM

## 2017-02-01 DIAGNOSIS — Z7982 Long term (current) use of aspirin: Secondary | ICD-10-CM | POA: Insufficient documentation

## 2017-02-01 DIAGNOSIS — T370X5A Adverse effect of sulfonamides, initial encounter: Secondary | ICD-10-CM | POA: Insufficient documentation

## 2017-02-01 MED ORDER — DIPHENHYDRAMINE HCL 25 MG PO CAPS: 25.0000 mg | ORAL_CAPSULE | ORAL | 0 refills | Status: DC | PRN

## 2017-02-01 MED ORDER — TRIAMCINOLONE ACETONIDE 0.025 % EX OINT: 1.0000 "application " | TOPICAL_OINTMENT | Freq: Two times a day (BID) | CUTANEOUS | 0 refills | Status: DC

## 2017-02-01 MED ORDER — PREDNISONE 10 MG PO TABS: ORAL_TABLET | ORAL | 0 refills | Status: DC

## 2017-02-01 NOTE — ED Triage Notes (Signed)
Pt here yesterday because was started on bactrim and "felt hot" from it.  Reports was switched to cipro and now has a red spot on chest and thinks is having allergic reaction to it. Ambulatory to triage. VSS. No respiratory distress.

## 2017-02-01 NOTE — ED Provider Notes (Signed)
[facility] Emergency Department Provider Note  ____________________________________________  Time seen: Approximately 8:59 AM  I have reviewed the triage vital signs and the nursing notes.   HISTORY  Chief Complaint Allergic Reaction    HPI Destiny [name] is a 61 y.o. female that presents to the emergency department with a warm sensation since yesterday. Patient was diagnosed with a UTI on Saturday and given Bactrim. Last dose of Bactrim was 2 days ago. Yesterday she began feeling a "warm" sensation. She came to the emergency department for concern of a antibiotic allergic reaction. She was given a shot of steroids and symptoms improved after. Shortly after leaving the ED yesterday patient noticed an area of itchy red skin on her chest. She had the warm feeling again last night and once again this morning. Feeling is primarily on the top half of her body. Her husband says that it is hormonal. She states that she does have hot flashes but this feels a little different. She denies fever, shortness of breath, palpitations, chest pain, arm pain, nausea, vomiting, abdominal pain.   Past Medical History:  Diagnosis Date  . CAD (coronary artery disease)   . CVA (cerebral infarction)   . Female bladder prolapse   . GERD (gastroesophageal reflux disease)   . Hyperlipidemia   . Hypertension   . Mitral valve disease   . Sleep apnea   . Stroke [facility])    x3    Patient Active Problem List   Diagnosis Date Noted  . Chest pain 02/14/2016  . Small bowel obstruction ([place]) 06/16/2015    Past Surgical History:  Procedure Laterality Date  . CARPAL TUNNEL RELEASE Bilateral   . CHOLECYSTECTOMY    . COLON SURGERY    . TENNIS ELBOW RELEASE/NIRSCHEL PROCEDURE Left   . TONSILLECTOMY      Prior to Admission medications   Medication Sig Start Date End Date Taking? Authorizing Provider  acetaminophen (TYLENOL) 500 MG tablet Take 1,000 mg by mouth every 6 (six) hours as  needed for mild pain or moderate pain.    [provider]  aspirin EC 81 MG tablet Take 81 mg by mouth every morning.     [provider]  azithromycin (ZITHROMAX) 250 MG tablet Take 2 tablets daily the first day. Take 1 tablet daily for the next 4 days. 01/06/17   Lannie Fields, PA-C  benzonatate (TESSALON PERLES) 100 MG capsule Take 1 capsule (100 mg total) by mouth 3 (three) times daily as needed for cough (Take 1-2 per dose). 09/11/16   Menshew, Dannielle Karvonen, PA-C  brompheniramine-pseudoephedrine-DM 30-2-10 MG/5ML syrup Take 5 mLs by mouth 4 (four) times daily as needed. 08/01/16   Sable Feil, PA-C  cetirizine (ZYRTEC) 10 MG tablet Take 1 tablet (10 mg total) by mouth daily. 12/22/16   Triplett, Johnette Abraham B, FNP  chlorpheniramine-HYDROcodone (TUSSIONEX PENNKINETIC ER) 10-8 MG/5ML SUER Take 5 mLs by mouth 2 (two) times daily. 08/09/16   Sable Feil, PA-C  Cholecalciferol (VITAMIN D3) 5000 UNITS CAPS Take 5,000 Units by mouth daily.    [provider]  ciprofloxacin (CIPRO) 250 MG tablet Take 1 tablet (250 mg total) by mouth 2 (two) times daily. 01/31/17 02/10/17  Sable Feil, PA-C  conjugated estrogens (PREMARIN) vaginal cream Place 1 Applicatorful vaginally at bedtime as needed (for vaginal health). At bedtime     [provider]  cyclobenzaprine (FLEXERIL) 10 MG tablet Take 1 tablet (10 mg total) by mouth 2 (two) times  daily as needed for muscle spasms. 03/18/16   Ivin Booty, MD  diclofenac sodium (VOLTAREN) 1 % GEL Apply 2 g topically 4 (four) times daily as needed (for pain).     [provider]  diphenhydrAMINE (BENADRYL) 25 mg capsule Take 1 capsule (25 mg total) by mouth every 4 (four) hours as needed. 02/01/17 02/01/18  Laban Emperor, PA-C  esomeprazole (NEXIUM) 40 MG capsule Take 40 mg by mouth 2 (two) times daily.    [provider]  fluticasone (FLONASE) 50 MCG/ACT nasal spray Place 2 sprays into both nostrils daily. 09/11/16    Menshew, Dannielle Karvonen, PA-C  furosemide (LASIX) 20 MG tablet Take 1 tablet (20 mg total) by mouth every other day. 02/15/16   Hillary Bow, MD  gabapentin (NEURONTIN) 600 MG tablet Take 1,800 mg by mouth at bedtime.     [provider]  hydrocortisone cream 0.5 % Apply 1 application topically daily as needed for itching.     [provider]  ibuprofen (ADVIL,MOTRIN) 600 MG tablet Take 1 tablet (600 mg total) by mouth every 8 (eight) hours as needed. 08/01/16   Sable Feil, PA-C  methylPREDNISolone (MEDROL DOSEPAK) 4 MG TBPK tablet Take Tapered dose as directed 08/09/16   Sable Feil, PA-C  metoprolol tartrate (LOPRESSOR) 25 MG tablet Take 0.5 tablets (12.5 mg total) by mouth 2 (two) times daily. 02/15/16   Hillary Bow, MD  nitroGLYCERIN (NITROSTAT) 0.4 MG SL tablet Place 0.4 mg under the tongue every 5 (five) minutes as needed for chest pain.    [provider]  nystatin cream (MYCOSTATIN) Apply 1 application topically daily as needed for dry skin.     [provider]  potassium chloride SA (K-DUR,KLOR-CON) 20 MEQ tablet Take 20 mEq by mouth daily.    [provider]  predniSONE (DELTASONE) 10 MG tablet Take 6 tablets on day 1, take 5 tablets on day 2, take 4 tablets on day 3, take 3 tablets on day 4, take 2 tablets on day 5, take 1 tablet on day 6 02/01/17   Laban Emperor, PA-C  rosuvastatin (CRESTOR) 20 MG tablet Take 20 mg by mouth every evening.     [provider]  sulfamethoxazole-trimethoprim (BACTRIM DS,SEPTRA DS) 800-160 MG tablet Take 1 tablet by mouth 2 (two) times daily. 01/28/17   Little, Traci M, PA-C  ticagrelor (BRILINTA) 90 MG TABS tablet Take 90 mg by mouth 2 (two) times daily.    [provider]  triamcinolone (KENALOG) 0.025 % ointment Apply 1 application topically 2 (two) times daily. 02/01/17   Laban Emperor, PA-C    Allergies Atorvastatin; Bactrim [sulfamethoxazole-trimethoprim]; Diflucan [fluconazole];  Sertraline; and Sulfa antibiotics  Family History  Problem Relation Age of Onset  . Heart disease Other   . Hypertension Other     Social History Social History  Substance Use Topics  . Smoking status: Never Smoker  . Smokeless tobacco: Never Used  . Alcohol use No     Review of Systems  Constitutional: No fever/chills ENT: No upper respiratory complaints. Cardiovascular: No chest pain. Respiratory: No SOB. Gastrointestinal: No abdominal pain.  No nausea, no vomiting.  Skin: Negative for abrasions, lacerations, ecchymosis. Positive for rash. Neurological: Negative for headaches, numbness or tingling   ____________________________________________   PHYSICAL EXAM:  VITAL SIGNS: ED Triage Vitals  Enc Vitals Group     BP 02/01/17 0821 124/85     Pulse Rate 02/01/17 0821 65     Resp 02/01/17 0821 18  Temp 02/01/17 0821 97.9 F (36.6 C)     Temp Source 02/01/17 0821 Oral     SpO2 02/01/17 0821 99 %     Weight 02/01/17 0821 202 lb (91.6 kg)     Height 02/01/17 0821 5\' 2"  (1.575 m)     Head Circumference --      Peak Flow --      Pain Score 02/01/17 0833 0     Pain Loc --      Pain Edu? --      Excl. in [place]? --      Constitutional: Alert and oriented. Well appearing and in no acute distress. Eyes: Conjunctivae are normal. PERRL. EOMI. Head: Atraumatic. ENT:      Ears:      Nose: No congestion/rhinnorhea.      Mouth/Throat: Mucous membranes are moist.  Neck: No stridor.  Cardiovascular: Normal rate, regular rhythm.  Good peripheral circulation. Respiratory: Normal respiratory effort without tachypnea or retractions. Lungs CTAB. Good air entry to the bases with no decreased or absent breath sounds. Musculoskeletal: Full range of motion to all extremities. No gross deformities appreciated. Neurologic:  Normal speech and language. No gross focal neurologic deficits are appreciated.  Skin:  Skin is warm, dry and intact. 1 cm x 1 cm erythematous and flaky skin  over sternum.   ____________________________________________   LABS (all labs ordered are listed, but only abnormal results are displayed)  Labs Reviewed - No data to display ____________________________________________  EKG   ____________________________________________  RADIOLOGY  No results found.  ____________________________________________    PROCEDURES  Procedure(s) performed:    Procedures    Medications - No data to display   ____________________________________________   INITIAL IMPRESSION / ASSESSMENT AND PLAN / ED COURSE  Pertinent labs & imaging results that were available during my care of the patient were reviewed by me and considered in my medical decision making (see chart for details).  Review of the [place] CSRS was performed in accordance of the [place] prior to dispensing any controlled drugs.   Patient presented to the emergency department for evaluation of a warm sensation and a rash. Vital signs and exam are reassuring. No changes on EKG. Patient denies SOB or CP. Warm sensation began shortly after patient started a prescription for Bactrim. Symptoms improved with prednisone but has not resolved. Rash is unlikely related. It appears like dry skin. Patient will be discharged home with prescriptions for prednisone, benadryl, triamcinalone. Patient is to follow up with PCP as directed. Patient is given ED precautions to return to the ED for any worsening or new symptoms.     ____________________________________________  FINAL CLINICAL IMPRESSION(S) / ED DIAGNOSES  Final diagnoses:  Rash  Allergic reaction to drug, subsequent encounter      NEW MEDICATIONS STARTED DURING THIS VISIT:  Discharge Medication List as of 02/01/2017  9:14 AM    START taking these medications   Details  diphenhydrAMINE (BENADRYL) 25 mg capsule Take 1 capsule (25 mg total) by mouth every 4 (four) hours as needed., Starting Wed 02/01/2017, Until Thu 02/01/2018,  Print    predniSONE (DELTASONE) 10 MG tablet Take 6 tablets on day 1, take 5 tablets on day 2, take 4 tablets on day 3, take 3 tablets on day 4, take 2 tablets on day 5, take 1 tablet on day 6, Print    triamcinolone (KENALOG) 0.025 % ointment Apply 1 application topically 2 (two) times daily., Starting Wed 02/01/2017, Print  This chart was dictated using voice recognition software/Dragon. Despite best efforts to proofread, errors can occur which can change the meaning. Any change was purely unintentional.    Laban Emperor, PA-C 02/01/17 1559    Lisa Roca, MD 02/01/17 [phone]

## 2017-02-01 NOTE — ED Notes (Signed)
Pt presents with itching to her chest and "hot" feeling since beginning cipro yesterday after being taken off bactrim for the hot feeling yesterday. Pt has small red spot on her chest; no hives. Denies difficulty breathing or other symptoms. Pt alert & oriented with NAD noted.

## 2017-02-01 NOTE — ED Notes (Signed)
First nurse note presents with possible allergic reaction  Was seen yesterday for same  Denies any rash or SOB but had a "hot "sensation..was placed on cipro yesterday and stopped taking septra  But states she still feels the same

## 2017-02-01 NOTE — ED Notes (Signed)
Pt discharged home after verbalizing understanding of discharge instructions; nad noted. 

## 2017-02-04 ENCOUNTER — Emergency Department (HOSPITAL_COMMUNITY): Payer: Self-pay

## 2017-02-04 ENCOUNTER — Observation Stay (HOSPITAL_COMMUNITY)
Admission: EM | Admit: 2017-02-04 | Discharge: 2017-02-05 | Disposition: A | Discharge status: Home / Self Care | Payer: Self-pay | Attending: Internal Medicine | Admitting: Internal Medicine

## 2017-02-04 ENCOUNTER — Encounter (HOSPITAL_COMMUNITY): Payer: Self-pay | Admitting: Emergency Medicine

## 2017-02-04 DIAGNOSIS — Z8673 Personal history of transient ischemic attack (TIA), and cerebral infarction without residual deficits: Secondary | ICD-10-CM | POA: Insufficient documentation

## 2017-02-04 DIAGNOSIS — R079 Chest pain, unspecified: Secondary | ICD-10-CM | POA: Diagnosis present

## 2017-02-04 DIAGNOSIS — I10 Essential (primary) hypertension: Secondary | ICD-10-CM | POA: Insufficient documentation

## 2017-02-04 DIAGNOSIS — R0789 Other chest pain: Principal | ICD-10-CM | POA: Insufficient documentation

## 2017-02-04 DIAGNOSIS — Z79899 Other long term (current) drug therapy: Secondary | ICD-10-CM | POA: Insufficient documentation

## 2017-02-04 DIAGNOSIS — I2510 Atherosclerotic heart disease of native coronary artery without angina pectoris: Secondary | ICD-10-CM | POA: Insufficient documentation

## 2017-02-04 DIAGNOSIS — Z7982 Long term (current) use of aspirin: Secondary | ICD-10-CM | POA: Insufficient documentation

## 2017-02-04 LAB — BASIC METABOLIC PANEL
Anion gap: 11 (ref 5–15)
BUN: 15 mg/dL (ref 6–20)
CALCIUM: 9.1 mg/dL (ref 8.9–10.3)
CO2: 24 mmol/L (ref 22–32)
CREATININE: 0.98 mg/dL (ref 0.44–1.00)
Chloride: 102 mmol/L (ref 101–111)
GFR calc non Af Amer: >60 mL/min (ref >60)
Glucose, Bld: 187 mg/dL — ABNORMAL HIGH (ref 65–99)
Potassium: 3.4 mmol/L — ABNORMAL LOW (ref 3.5–5.1)
SODIUM: 137 mmol/L (ref 135–145)

## 2017-02-04 LAB — CBC
HCT: 36.5 % (ref 36.0–46.0)
Hemoglobin: 12 g/dL (ref 12.0–15.0)
MCH: 30.5 pg (ref 26.0–34.0)
MCHC: 32.9 g/dL (ref 30.0–36.0)
MCV: 92.9 fL (ref 78.0–100.0)
Platelets: 205 10*3/uL (ref 150–400)
RBC: 3.93 MIL/uL (ref 3.87–5.11)
RDW: 13.1 % (ref 11.5–15.5)
WBC: 6.7 10*3/uL (ref 4.0–10.5)

## 2017-02-04 LAB — I-STAT TROPONIN, ED: TROPONIN I, POC: 0 ng/mL (ref 0.00–0.08)

## 2017-02-04 MED ORDER — FENTANYL CITRATE (PF) 100 MCG/2ML IJ SOLN
50.0000 ug | INTRAMUSCULAR | Status: DC | PRN
  Administered 2017-02-04: 50 ug via INTRAVENOUS
  Filled 2017-02-04: qty 2

## 2017-02-04 MED ORDER — FAMOTIDINE 20 MG PO TABS
20.0000 mg | ORAL_TABLET | Freq: Once | ORAL | Status: AC
Start: 2017-02-04 — End: 2017-02-04
  Administered 2017-02-04: 20 mg via ORAL
  Filled 2017-02-04: qty 1

## 2017-02-04 MED ORDER — NITROGLYCERIN 0.4 MG SL SUBL
0.4000 mg | SUBLINGUAL_TABLET | SUBLINGUAL | Status: AC | PRN
  Administered 2017-02-04 (×3): 0.4 mg via SUBLINGUAL
  Filled 2017-02-04: qty 1

## 2017-02-04 MED ORDER — ONDANSETRON 4 MG PO TBDP
4.0000 mg | ORAL_TABLET | Freq: Three times a day (TID) | ORAL | Status: DC | PRN
  Administered 2017-02-04: 4 mg via ORAL
  Filled 2017-02-04 (×2): qty 1

## 2017-02-04 MED ORDER — ASPIRIN 81 MG PO CHEW
324.0000 mg | CHEWABLE_TABLET | Freq: Once | ORAL | Status: AC
  Administered 2017-02-04: 324 mg via ORAL
  Filled 2017-02-04: qty 4

## 2017-02-04 MED ORDER — POTASSIUM CHLORIDE CRYS ER 20 MEQ PO TBCR
40.0000 meq | EXTENDED_RELEASE_TABLET | Freq: Once | ORAL | Status: AC
  Administered 2017-02-05: 40 meq via ORAL
  Filled 2017-02-04: qty 2

## 2017-02-04 NOTE — H&P (Signed)
Date: 02/05/2017               Patient Name:  Destiny [name] MRN: [id number]  DOB: Apr 20, 1956 Age / Sex: 61 y.o., female   PCP: [name], MD         Medical Service: Internal Medicine Teaching Service         Attending Physician: Dr. Aldine Contes, MD    First Contact: Dr. Trilby Drummer Pager: [phone]  Second Contact: Dr. Benjamine Mola Pager: [phone]       After Hours (After 5p/  First Contact Pager: [phone]  weekends / holidays): Second Contact Pager: [phone]   Chief Complaint: Chest pain  History of Present Illness:  61 yo female with past medical history of CAD s/p stent placement 2016, HTN, TIA, HLD, OSA, and GERD presenting for chest pain. She states the chest pain started several days ago. She describes the pain is as achy, substernal, and non-radiating. She rates the pain 7/10. She states the pain will occur during rest or exertion. She says when it occurs it last for about 20-30 minutes. She states this pain was similar to the pain she experienced in 2016 when she had her stent placement. She endorses having pain when she arrived at the ED and was given 3 Nitrostat tablets and fentanyl, which alleviated her symptoms. The patient did state that she has been experiencing painful acid reflux the past week. She takes Nexium twice daily, but is still symptomatic. She states the quality of her heartburn pain and chest pain are very different. She also endorses dry cough for the past few days.    The patient also states she had two episodes of left sided jaw pain and jaw locking earlier this week. She describes the pain as "heavy" and that she is unable to move her jaw. She says the episodes last for 5-10 minutes, and she rates the pain 8/10 in severity, it is non-radiating and not associated with her chest pain. She has never experienced this before.   The patient also endorsed concerns about leg cramps she has been experiencing for one week. She describes the pain as cramping and it has  occurred every night this week. The cramping can be in her ankles, calves, or behind the knees. Getting out of bed relieves the cramping somewhat, but when she returns to bed the cramps occur again.  The patient also says she has been experiencing episodes of sweating the past week, which have been waking her up at night. The patient claims these are much different than menopausal hot flashes. These episodes are not associated with her chest pain or jaw pain.  She denies fever, chills, or vomiting but endorses nausea and one episode of diarrhea this morning. She is currently n being treated for a urinary tract infection and endorses dysuria and frequency. She also endorses fatigue and shortness of breath on exertion.  Meds:  Current Meds  Medication Sig  . acetaminophen (TYLENOL) 500 MG tablet Take 1,000 mg by mouth every 6 (six) hours as needed for headache.   Marland Kitchen aspirin EC 81 MG tablet Take 81 mg by mouth every morning.   . cetirizine (ZYRTEC) 10 MG tablet Take 1 tablet (10 mg total) by mouth daily. (Patient taking differently: Take 10 mg by mouth daily as needed for allergies. )  . Cholecalciferol (VITAMIN D3) 5000 UNITS CAPS Take 5,000 Units by mouth daily.  . ciprofloxacin (CIPRO) 250 MG tablet Take 1 tablet (250 mg total) by  mouth 2 (two) times daily. (Patient taking differently: Take 250 mg by mouth 2 (two) times daily. 10 day course started 01/31/17 pm)  . diphenhydrAMINE (BENADRYL) 25 mg capsule Take 1 capsule (25 mg total) by mouth every 4 (four) hours as needed. (Patient taking differently: Take 25 mg by mouth every 4 (four) hours as needed for itching. )  . Docusate Calcium (STOOL SOFTENER PO) Take 1 capsule by mouth daily.  Marland Kitchen esomeprazole (NEXIUM) 40 MG capsule Take 40 mg by mouth 2 (two) times daily.  . furosemide (LASIX) 20 MG tablet Take 1 tablet (20 mg total) by mouth every other day. (Patient taking differently: Take 20-40 mg by mouth See admin instructions. Take 1-2 tablets (20-40  mg) by mouth once or twice daily (depending on leg, foot and hand swelling -  1 tablet (20 mg) daily, 2 tablets (40 mg) in the am if swelling, sometimes 1 tablet (20 mg) at 3pm if needed for swelling))  . gabapentin (NEURONTIN) 600 MG tablet Take 1,800 mg by mouth at bedtime.   Marland Kitchen ibuprofen (ADVIL,MOTRIN) 600 MG tablet Take 1 tablet (600 mg total) by mouth every 8 (eight) hours as needed. (Patient taking differently: Take 600 mg by mouth every 8 (eight) hours as needed for headache (pain). )  . IRON PO Take 1 tablet by mouth daily.  . metoprolol tartrate (LOPRESSOR) 25 MG tablet Take 0.5 tablets (12.5 mg total) by mouth 2 (two) times daily.  . nitroGLYCERIN (NITROSTAT) 0.4 MG SL tablet Place 0.4 mg under the tongue every 5 (five) minutes as needed for chest pain.  . potassium chloride SA (K-DUR,KLOR-CON) 20 MEQ tablet Take 20 mEq by mouth daily.  . predniSONE (DELTASONE) 10 MG tablet Take 6 tablets on day 1, take 5 tablets on day 2, take 4 tablets on day 3, take 3 tablets on day 4, take 2 tablets on day 5, take 1 tablet on day 6 (Patient taking differently: Take 10-60 mg by mouth See admin instructions. Take 6 tablets (60 mg) by mouth on day 1, take 5 tablets (50 mg) on day 2, take 4 tablets (40 mg) on day 3, take 3 tablets (30 mg) on day 4, take 2 tablets (20 mg) on day 5, take 1 tablet  (10 mg) on day 6, then stop)  . rosuvastatin (CRESTOR) 20 MG tablet Take 20 mg by mouth at bedtime.   . triamcinolone (KENALOG) 0.025 % ointment Apply 1 application topically 2 (two) times daily. (Patient taking differently: Apply 1 application topically 2 (two) times daily as needed (itching/rash). )     Allergies: Allergies as of 02/04/2017 - Review Complete 02/04/2017  Allergen Reaction Noted  . Atorvastatin Hives 06/16/2015  . Bactrim [sulfamethoxazole-trimethoprim] Rash and Other (See Comments) 02/01/2017  . Diflucan [fluconazole] Rash 11/27/2014  . Sertraline Itching 11/09/2015  . Sulfa antibiotics Rash and  Other (See Comments) 01/31/2017   Past Medical History:  Diagnosis Date  . CAD (coronary artery disease)   . CVA (cerebral infarction)   . Female bladder prolapse   . GERD (gastroesophageal reflux disease)   . Hyperlipidemia   . Hypertension   . Mitral valve disease   . Sleep apnea   . Stroke [facility])    x3    Family History: Mother rheumatoid arthritis, Father died of MI at 78, MGF history of MI  Social History: Denies tobacco use, alcohol use, as well as recreational or IV drug use.  Review of Systems: Review of Systems  Constitutional: Positive for malaise/fatigue.  Respiratory: Positive for cough.   Gastrointestinal: Positive for diarrhea, heartburn and nausea.  Genitourinary: Positive for dysuria and frequency.  Neurological: Negative.   Endo/Heme/Allergies: Negative.   Psychiatric/Behavioral: Negative.     Physical Exam: Blood pressure 124/75, pulse (!) 52, temperature 98 F (36.7 C), temperature source Oral, resp. rate 17, height 5\' 2"  (1.575 m), weight 202 lb (91.6 kg), SpO2 98 %. Physical Exam  Constitutional: She is oriented to person, place, and time. She appears well-developed and well-nourished.  HENT:  Head: Normocephalic and atraumatic.  Eyes: Pupils are equal, round, and reactive to light.  Neck: Normal range of motion.  Cardiovascular: Normal rate, regular rhythm and normal heart sounds.   Pulmonary/Chest: Effort normal and breath sounds normal.  Abdominal: Soft. Bowel sounds are normal. There is no tenderness.  Musculoskeletal: She exhibits no edema or tenderness.  Neurological: She is alert and oriented to person, place, and time.  Skin: Skin is warm and dry.  Psychiatric: She has a normal mood and affect.    EKG: personally reviewed my interpretation is normal sinus rhythm.   CXR: personally reviewed my interpretation is no pulmonary infiltrate, pleural effusion or pneumothorax.  Assessment & Plan by Problem:  Typical Chest Pain -Risk factors  include CAD s/p DES 2016, Heart Score of 4, HLD -Initial EKG was non-ischemic and she is currently normotensive -Continue telemetry -First troponin 0.00 -Continue to trend troponins q6 hours -CXR: no evidence of pulmonary infiltrate   CAD, HTN, HLD -Continue Metoprolol tartrate 12.5mg  BID, Aspirin 81 mg, Rosuvastatin 20 mg daily, Lasix 20 mg BID  Hypergylcemia -CMET glucose 187 -Most likely secondary to Prednisone -Obtain A1C  UTI -Secondary to pessary device -Continue Ciprofloxacin 250 mg twice daily -Continue Prednisone taper for adverse reaction to Bactrim-->patient then switched to Cipro  Leg Cramps, nocturnal -Likely having muscle spasms -No signs of DVT, good perfusion to legs bilaterally -CMET WNL, K+ 3.4, most likely not due to electrolyte abnormality -Can consider muscle relaxant -Can be worked up as outpatient    Dispo: Admit patient to Observation with expected length of stay less than 2 midnights.  Signed: Melanee Spry, MD 02/05/2017, 2:33 AM  Pager: [phone]

## 2017-02-04 NOTE — ED Provider Notes (Signed)
[facility] DEPT Provider Note   CSN: [id number] Arrival date & time: 02/04/17  1752     History   Chief Complaint Chief Complaint  Patient presents with  . Chest Pain    HPI Destiny [name] is a 61 y.o. female.  Patient with coronary artery disease history 2 stents placed in [facility] 3 years ago presents with persistent chest pressure that has not resolved the past 3 days. Initially thought it was reflux symptoms however she started getting sweaty with it as well. No shortness of breath. This does feel similar to stents 3 years ago. No local physician. Patient is on baby aspirin. No specific exertional component.      Past Medical History:  Diagnosis Date  . CAD (coronary artery disease)   . CVA (cerebral infarction)   . Female bladder prolapse   . GERD (gastroesophageal reflux disease)   . Hyperlipidemia   . Hypertension   . Mitral valve disease   . Sleep apnea   . Stroke [facility])    x3    Patient Active Problem List   Diagnosis Date Noted  . Chest pain 02/14/2016  . Small bowel obstruction ([place]) 06/16/2015    Past Surgical History:  Procedure Laterality Date  . CARPAL TUNNEL RELEASE Bilateral   . CHOLECYSTECTOMY    . COLON SURGERY    . TENNIS ELBOW RELEASE/NIRSCHEL PROCEDURE Left   . TONSILLECTOMY      OB History    No data available       Home Medications    Prior to Admission medications   Medication Sig Start Date End Date Taking? Authorizing Provider  acetaminophen (TYLENOL) 500 MG tablet Take 1,000 mg by mouth every 6 (six) hours as needed for mild pain or moderate pain.    [provider]  aspirin EC 81 MG tablet Take 81 mg by mouth every morning.     [provider]  azithromycin (ZITHROMAX) 250 MG tablet Take 2 tablets daily the first day. Take 1 tablet daily for the next 4 days. 01/06/17   Lannie Fields, PA-C  benzonatate (TESSALON PERLES) 100 MG capsule Take 1 capsule (100 mg total) by mouth 3 (three) times daily  as needed for cough (Take 1-2 per dose). 09/11/16   Menshew, Dannielle Karvonen, PA-C  brompheniramine-pseudoephedrine-DM 30-2-10 MG/5ML syrup Take 5 mLs by mouth 4 (four) times daily as needed. 08/01/16   Sable Feil, PA-C  cetirizine (ZYRTEC) 10 MG tablet Take 1 tablet (10 mg total) by mouth daily. 12/22/16   Triplett, Johnette Abraham B, FNP  chlorpheniramine-HYDROcodone (TUSSIONEX PENNKINETIC ER) 10-8 MG/5ML SUER Take 5 mLs by mouth 2 (two) times daily. 08/09/16   Sable Feil, PA-C  Cholecalciferol (VITAMIN D3) 5000 UNITS CAPS Take 5,000 Units by mouth daily.    [provider]  ciprofloxacin (CIPRO) 250 MG tablet Take 1 tablet (250 mg total) by mouth 2 (two) times daily. 01/31/17 02/10/17  Sable Feil, PA-C  conjugated estrogens (PREMARIN) vaginal cream Place 1 Applicatorful vaginally at bedtime as needed (for vaginal health). At bedtime     [provider]  cyclobenzaprine (FLEXERIL) 10 MG tablet Take 1 tablet (10 mg total) by mouth 2 (two) times daily as needed for muscle spasms. 03/18/16   Ivin Booty, MD  diclofenac sodium (VOLTAREN) 1 % GEL Apply 2 g topically 4 (four) times daily as needed (for pain).     [provider]  diphenhydrAMINE (BENADRYL) 25 mg capsule Take 1 capsule (25 mg  total) by mouth every 4 (four) hours as needed. 02/01/17 02/01/18  Laban Emperor, PA-C  esomeprazole (NEXIUM) 40 MG capsule Take 40 mg by mouth 2 (two) times daily.    [provider]  fluticasone (FLONASE) 50 MCG/ACT nasal spray Place 2 sprays into both nostrils daily. 09/11/16   Menshew, Dannielle Karvonen, PA-C  furosemide (LASIX) 20 MG tablet Take 1 tablet (20 mg total) by mouth every other day. 02/15/16   Hillary Bow, MD  gabapentin (NEURONTIN) 600 MG tablet Take 1,800 mg by mouth at bedtime.     [provider]  hydrocortisone cream 0.5 % Apply 1 application topically daily as needed for itching.     [provider]  ibuprofen (ADVIL,MOTRIN) 600 MG tablet Take 1  tablet (600 mg total) by mouth every 8 (eight) hours as needed. 08/01/16   Sable Feil, PA-C  methylPREDNISolone (MEDROL DOSEPAK) 4 MG TBPK tablet Take Tapered dose as directed 08/09/16   Sable Feil, PA-C  metoprolol tartrate (LOPRESSOR) 25 MG tablet Take 0.5 tablets (12.5 mg total) by mouth 2 (two) times daily. 02/15/16   Hillary Bow, MD  nitroGLYCERIN (NITROSTAT) 0.4 MG SL tablet Place 0.4 mg under the tongue every 5 (five) minutes as needed for chest pain.    [provider]  nystatin cream (MYCOSTATIN) Apply 1 application topically daily as needed for dry skin.     [provider]  potassium chloride SA (K-DUR,KLOR-CON) 20 MEQ tablet Take 20 mEq by mouth daily.    [provider]  predniSONE (DELTASONE) 10 MG tablet Take 6 tablets on day 1, take 5 tablets on day 2, take 4 tablets on day 3, take 3 tablets on day 4, take 2 tablets on day 5, take 1 tablet on day 6 02/01/17   Laban Emperor, PA-C  rosuvastatin (CRESTOR) 20 MG tablet Take 20 mg by mouth every evening.     [provider]  sulfamethoxazole-trimethoprim (BACTRIM DS,SEPTRA DS) 800-160 MG tablet Take 1 tablet by mouth 2 (two) times daily. 01/28/17   Little, Traci M, PA-C  ticagrelor (BRILINTA) 90 MG TABS tablet Take 90 mg by mouth 2 (two) times daily.    [provider]  triamcinolone (KENALOG) 0.025 % ointment Apply 1 application topically 2 (two) times daily. 02/01/17   Laban Emperor, PA-C    Family History Family History  Problem Relation Age of Onset  . Heart disease Other   . Hypertension Other     Social History Social History  Substance Use Topics  . Smoking status: Never Smoker  . Smokeless tobacco: Never Used  . Alcohol use No     Allergies   Atorvastatin; Bactrim [sulfamethoxazole-trimethoprim]; Diflucan [fluconazole]; Sertraline; and Sulfa antibiotics   Review of Systems Review of Systems  Constitutional: Positive for diaphoresis. Negative for chills and  fever.  HENT: Negative for congestion.   Eyes: Negative for visual disturbance.  Respiratory: Negative for shortness of breath.   Cardiovascular: Positive for chest pain.  Gastrointestinal: Negative for abdominal pain and vomiting.  Genitourinary: Negative for dysuria and flank pain.  Musculoskeletal: Negative for back pain, neck pain and neck stiffness.  Skin: Negative for rash.  Neurological: Negative for light-headedness and headaches.     Physical Exam Updated Vital Signs BP 136/85   Pulse 65   Temp 98.6 F (37 C)   Resp 18   Ht 5\' 2"  (1.575 m)   Wt 91.6 kg (202 lb)   SpO2 97%   BMI 36.95 kg/m  Physical Exam  Constitutional: She is oriented to person, place, and time. She appears well-developed and well-nourished.  HENT:  Head: Normocephalic and atraumatic.  Eyes: Conjunctivae are normal. Right eye exhibits no discharge. Left eye exhibits no discharge.  Neck: Normal range of motion. Neck supple. No tracheal deviation present.  Cardiovascular: Normal rate and regular rhythm.   Pulmonary/Chest: Effort normal and breath sounds normal.  Abdominal: Soft. She exhibits no distension. There is no tenderness. There is no guarding.  Musculoskeletal: She exhibits no edema.  Neurological: She is alert and oriented to person, place, and time.  Skin: Skin is warm. No rash noted.  Psychiatric: She has a normal mood and affect.  Nursing note and vitals reviewed.    ED Treatments / Results  Labs (all labs ordered are listed, but only abnormal results are displayed) Labs Reviewed  BASIC METABOLIC PANEL - Abnormal; Notable for the following:       Result Value   Potassium 3.4 (*)    Glucose, Bld 187 (*)    All other components within normal limits  CBC  I-STAT TROPONIN, ED    EKG  EKG Interpretation  Date/Time:  Saturday February 04 2017 17:55:56 EDT Ventricular Rate:  70 PR Interval:  136 QRS Duration: 90 QT Interval:  428 QTC Calculation: 462 R Axis:   18 Text  Interpretation:  Normal sinus rhythm Normal ECG Confirmed by Elnora Morrison [phone]) on 02/04/2017 6:34:44 PM       Radiology Dg Chest 2 View  Result Date: 02/04/2017 CLINICAL DATA:  Midsternal chest pain with dry cough, severe hot flashes/diaphoresis and heartburn for 1-2 days, history coronary artery disease post stenting, hypertension EXAM: CHEST  2 VIEW COMPARISON:  01/28/2017 FINDINGS: Normal heart size, mediastinal contours, and pulmonary vascularity. Coronary stent projects over LEFT heart. Minimal chronic peribronchial thickening. No pulmonary infiltrate, pleural effusion or pneumothorax. Bones demineralized. IMPRESSION: Minimal chronic bronchitic changes without acute infiltrate. Electronically Signed   By: Lavonia Dana M.D.   On: 02/04/2017 18:22    Procedures Procedures (including critical care time)  Medications Ordered in ED Medications  aspirin chewable tablet 324 mg (324 mg Oral Given 02/04/17 1859)  famotidine (PEPCID) tablet 20 mg (20 mg Oral Given 02/04/17 1858)     Initial Impression / Assessment and Plan / ED Course  I have reviewed the triage vital signs and the nursing notes.  Pertinent labs & imaging results that were available during my care of the patient were reviewed by me and considered in my medical decision making (see chart for details).     Chest effort patient presents with chest pressure that is not resolving with her reflux medicines. With her cardiac history and feeling similar plan for observation telemetry for possible stress test and further workup.  The patients results and plan were reviewed and discussed.   Any x-rays performed were independently reviewed by myself.   Differential diagnosis were considered with the presenting HPI.  Medications  aspirin chewable tablet 324 mg (324 mg Oral Given 02/04/17 1859)  famotidine (PEPCID) tablet 20 mg (20 mg Oral Given 02/04/17 1858)    Vitals:   02/04/17 1845 02/04/17 1900 02/04/17 1915 02/04/17 1930   BP: 124/78 136/82 125/80 136/85  Pulse: 64 74 64 65  Resp: 14 14 19 18   Temp:      SpO2: 98% 97% 99% 97%  Weight:      Height:        Final diagnoses:  Acute chest pain  Admission/ observation were discussed with the admitting physician, patient and/or family and they are comfortable with the plan.   Final diagnoses:  Acute chest pain    New Prescriptions New Prescriptions   No medications on file     Elnora Morrison, MD 02/04/17 [phone]

## 2017-02-04 NOTE — ED Notes (Signed)
Dr. Heber Clearmont notified pt is now nauseated. Pt report no pain

## 2017-02-04 NOTE — ED Notes (Signed)
Pt is unable to use CPAP due to inability to set it up where she lives but she was prescribed CPAP for sleep 1 year ago.

## 2017-02-04 NOTE — ED Triage Notes (Signed)
Pt c/o left sided chest pain x 2 days increasing today. Pain described as achy.

## 2017-02-05 ENCOUNTER — Observation Stay (HOSPITAL_COMMUNITY): Payer: Self-pay

## 2017-02-05 ENCOUNTER — Encounter (HOSPITAL_COMMUNITY): Payer: Self-pay | Admitting: *Deleted

## 2017-02-05 ENCOUNTER — Encounter: Payer: Self-pay | Admitting: Internal Medicine

## 2017-02-05 DIAGNOSIS — Z955 Presence of coronary angioplasty implant and graft: Secondary | ICD-10-CM

## 2017-02-05 DIAGNOSIS — Z882 Allergy status to sulfonamides status: Secondary | ICD-10-CM

## 2017-02-05 DIAGNOSIS — N390 Urinary tract infection, site not specified: Secondary | ICD-10-CM

## 2017-02-05 DIAGNOSIS — I2510 Atherosclerotic heart disease of native coronary artery without angina pectoris: Secondary | ICD-10-CM

## 2017-02-05 DIAGNOSIS — R252 Cramp and spasm: Secondary | ICD-10-CM

## 2017-02-05 DIAGNOSIS — Z888 Allergy status to other drugs, medicaments and biological substances status: Secondary | ICD-10-CM

## 2017-02-05 DIAGNOSIS — K219 Gastro-esophageal reflux disease without esophagitis: Secondary | ICD-10-CM

## 2017-02-05 DIAGNOSIS — M279 Disease of jaws, unspecified: Secondary | ICD-10-CM

## 2017-02-05 DIAGNOSIS — R0789 Other chest pain: Secondary | ICD-10-CM

## 2017-02-05 LAB — ECHOCARDIOGRAM COMPLETE
AOASC: 27 cm
CHL CUP DOP CALC LVOT VTI: 23.4 cm
E/e' ratio: 9.26
EWDT: 236 ms
FS: 34 % (ref 28–44)
HEIGHTINCHES: 62 in
IVS/LV PW RATIO, ED: 1
LA vol A4C: 47 ml
LADIAMINDEX: 1.53 cm/m2
LASIZE: 31 mm
LDCA: 3.14 cm2
LEFT ATRIUM END SYS DIAM: 31 mm
LV E/e' medial: 9.26
LV E/e'average: 9.26
LV PW d: 10 mm — AB (ref 0.6–1.1)
LV TDI E'MEDIAL: 10.1
LV e' LATERAL: 10.4 cm/s
LVOT SV: 73 mL
LVOT peak grad rest: 6 mmHg
LVOT peak vel: 122 cm/s
LVOTD: 20 mm
Lateral S' vel: 14.4 cm/s
MV Dec: 236
MV Peak grad: 4 mmHg
MV pk A vel: 123 m/s
MVPKEVEL: 96.3 m/s
TAPSE: 17.5 mm
TDI e' lateral: 10.4
WEIGHTICAEL: 3152 [oz_av]

## 2017-02-05 LAB — BASIC METABOLIC PANEL
Anion gap: 6 (ref 5–15)
BUN: 15 mg/dL (ref 6–20)
CALCIUM: 9 mg/dL (ref 8.9–10.3)
CHLORIDE: 107 mmol/L (ref 101–111)
CO2: 27 mmol/L (ref 22–32)
CREATININE: 0.67 mg/dL (ref 0.44–1.00)
GFR calc Af Amer: >60 mL/min (ref >60)
Glucose, Bld: 107 mg/dL — ABNORMAL HIGH (ref 65–99)
Potassium: 3.7 mmol/L (ref 3.5–5.1)
SODIUM: 140 mmol/L (ref 135–145)

## 2017-02-05 LAB — CBC
HCT: 36.2 % (ref 36.0–46.0)
Hemoglobin: 12.1 g/dL (ref 12.0–15.0)
MCH: 30.9 pg (ref 26.0–34.0)
MCHC: 33.4 g/dL (ref 30.0–36.0)
MCV: 92.3 fL (ref 78.0–100.0)
Platelets: 182 10*3/uL (ref 150–400)
RBC: 3.92 MIL/uL (ref 3.87–5.11)
RDW: 13.5 % (ref 11.5–15.5)
WBC: 7 10*3/uL (ref 4.0–10.5)

## 2017-02-05 LAB — TROPONIN I: Troponin I: <0.03 ng/mL (ref <0.03)

## 2017-02-05 MED ORDER — ROSUVASTATIN CALCIUM 10 MG PO TABS
20.0000 mg | ORAL_TABLET | Freq: Every day | ORAL | Status: DC
  Administered 2017-02-05: 20 mg via ORAL
  Filled 2017-02-05: qty 2

## 2017-02-05 MED ORDER — PANTOPRAZOLE SODIUM 40 MG PO TBEC
40.0000 mg | DELAYED_RELEASE_TABLET | Freq: Every day | ORAL | Status: DC
  Administered 2017-02-05 (×2): 40 mg via ORAL
  Filled 2017-02-05 (×2): qty 1

## 2017-02-05 MED ORDER — METOPROLOL TARTRATE 12.5 MG HALF TABLET
12.5000 mg | ORAL_TABLET | Freq: Two times a day (BID) | ORAL | Status: DC
  Administered 2017-02-05: 12.5 mg via ORAL
  Filled 2017-02-05 (×2): qty 1

## 2017-02-05 MED ORDER — ENOXAPARIN SODIUM 40 MG/0.4ML ~~LOC~~ SOLN
40.0000 mg | SUBCUTANEOUS | Status: DC
  Filled 2017-02-05: qty 0.4

## 2017-02-05 MED ORDER — FUROSEMIDE 20 MG PO TABS
20.0000 mg | ORAL_TABLET | Freq: Every day | ORAL | Status: DC
  Administered 2017-02-05: 20 mg via ORAL
  Filled 2017-02-05: qty 1

## 2017-02-05 MED ORDER — CIPROFLOXACIN HCL 500 MG PO TABS
250.0000 mg | ORAL_TABLET | Freq: Two times a day (BID) | ORAL | Status: DC
  Administered 2017-02-05: 250 mg via ORAL
  Filled 2017-02-05: qty 1

## 2017-02-05 MED ORDER — DIPHENHYDRAMINE HCL 25 MG PO CAPS: 25.0000 mg | ORAL_CAPSULE | ORAL | Status: DC | PRN

## 2017-02-05 MED ORDER — ACETAMINOPHEN 325 MG PO TABS
325.0000 mg | ORAL_TABLET | ORAL | Status: DC | PRN
  Administered 2017-02-05: 325 mg via ORAL
  Filled 2017-02-05: qty 1

## 2017-02-05 MED ORDER — LORATADINE 10 MG PO TABS: 10.0000 mg | ORAL_TABLET | Freq: Every day | ORAL | Status: DC

## 2017-02-05 MED ORDER — PREDNISONE 20 MG PO TABS
20.0000 mg | ORAL_TABLET | Freq: Every day | ORAL | Status: DC
  Administered 2017-02-05: 20 mg via ORAL
  Filled 2017-02-05: qty 1

## 2017-02-05 MED ORDER — ASPIRIN EC 81 MG PO TBEC
81.0000 mg | DELAYED_RELEASE_TABLET | ORAL | Status: DC
  Administered 2017-02-05: 81 mg via ORAL
  Filled 2017-02-05: qty 1

## 2017-02-05 MED ORDER — GABAPENTIN 600 MG PO TABS
1800.0000 mg | ORAL_TABLET | Freq: Every day | ORAL | Status: DC
  Administered 2017-02-05: 1800 mg via ORAL
  Filled 2017-02-05: qty 3

## 2017-02-05 MED ORDER — NITROGLYCERIN 0.4 MG SL SUBL: 0.4000 mg | SUBLINGUAL_TABLET | SUBLINGUAL | Status: DC | PRN

## 2017-02-05 NOTE — Discharge Summary (Signed)
Name: Destiny [name] MRN: [id number] DOB: [birth year] 61 y.o. PCP: Destiny Lank, MD  Date of Admission: 02/04/2017  6:11 PM Date of Discharge: 02/05/2017 Attending Physician: Aldine Contes, MD  Discharge Diagnosis:  Active Problems:   Chest pain   Discharge Medications: Allergies as of 02/05/2017      Reactions   Atorvastatin Hives   No s/sx of anaphylaxis, just intermittent hives.  Does not seem to be a class effect as she has tolerated lovastatin previously   Bactrim [sulfamethoxazole-trimethoprim] Rash, Other (See Comments)   "felt hot"   Diflucan [fluconazole] Rash   Reaction to generic but not name brand   Sertraline Itching   Sulfa Antibiotics Rash, Other (See Comments)   "hot " sensation      Medication List    TAKE these medications   acetaminophen 500 MG tablet Commonly known as:  TYLENOL Take 1,000 mg by mouth every 6 (six) hours as needed for headache.   aspirin EC 81 MG tablet Take 81 mg by mouth every morning.   benzonatate 100 MG capsule Commonly known as:  TESSALON PERLES Take 1 capsule (100 mg total) by mouth 3 (three) times daily as needed for cough (Take 1-2 per dose).   brompheniramine-pseudoephedrine-DM 30-2-10 MG/5ML syrup Take 5 mLs by mouth 4 (four) times daily as needed.   cetirizine 10 MG tablet Commonly known as:  ZYRTEC Take 1 tablet (10 mg total) by mouth daily. What changed:  when to take this  reasons to take this   chlorpheniramine-HYDROcodone 10-8 MG/5ML Suer Commonly known as:  TUSSIONEX PENNKINETIC ER Take 5 mLs by mouth 2 (two) times daily.   ciprofloxacin 250 MG tablet Commonly known as:  CIPRO Take 1 tablet (250 mg total) by mouth 2 (two) times daily. What changed:  additional instructions   cyclobenzaprine 10 MG tablet Commonly known as:  FLEXERIL Take 1 tablet (10 mg total) by mouth 2 (two) times daily as needed for muscle spasms.   diphenhydrAMINE 25 mg capsule Commonly known as:  BENADRYL Take 1 capsule  (25 mg total) by mouth every 4 (four) hours as needed. What changed:  reasons to take this   esomeprazole 40 MG capsule Commonly known as:  NEXIUM Take 40 mg by mouth 2 (two) times daily.   fluticasone 50 MCG/ACT nasal spray Commonly known as:  FLONASE Place 2 sprays into both nostrils daily.   furosemide 20 MG tablet Commonly known as:  LASIX Take 1 tablet (20 mg total) by mouth every other day. What changed:  how much to take  when to take this  additional instructions   gabapentin 600 MG tablet Commonly known as:  NEURONTIN Take 1,800 mg by mouth at bedtime.   ibuprofen 600 MG tablet Commonly known as:  ADVIL,MOTRIN Take 1 tablet (600 mg total) by mouth every 8 (eight) hours as needed. What changed:  reasons to take this   IRON PO Take 1 tablet by mouth daily.   metoprolol tartrate 25 MG tablet Commonly known as:  LOPRESSOR Take 0.5 tablets (12.5 mg total) by mouth 2 (two) times daily.   nitroGLYCERIN 0.4 MG SL tablet Commonly known as:  NITROSTAT Place 0.4 mg under the tongue every 5 (five) minutes as needed for chest pain.   potassium chloride SA 20 MEQ tablet Commonly known as:  K-DUR,KLOR-CON Take 20 mEq by mouth daily.   predniSONE 10 MG tablet Commonly known as:  DELTASONE Take 6 tablets on day 1, take 5 tablets on day 2, take  4 tablets on day 3, take 3 tablets on day 4, take 2 tablets on day 5, take 1 tablet on day 6 What changed:  how much to take  how to take this  when to take this  additional instructions   rosuvastatin 20 MG tablet Commonly known as:  CRESTOR Take 20 mg by mouth at bedtime.   STOOL SOFTENER PO Take 1 capsule by mouth daily.   triamcinolone 0.025 % ointment Commonly known as:  KENALOG Apply 1 application topically 2 (two) times daily. What changed:  when to take this  reasons to take this   Vitamin D3 5000 units Caps Take 5,000 Units by mouth daily.       Disposition and follow-up:   Ms.Destiny [name]  was discharged from [facility] in Good condition.  At the hospital follow up visit please address:  1. Chest Pain: Please ensure patient followed up with her cardiologist and check on the date or results of her stress test if available. Please evaluate patients response to current GERD regimen as she is still having symptoms and adjust a indicated.  2. UTI: Please monitor for resolution of symptoms.  3. Leg Cramps and jaw locking: No significant electrolyte abnormality seen. Please work up as an outpatient.  4.  Labs / imaging needed at time of follow-up: None  5.  Pending labs/ test needing follow-up: HIV Screen, Hemoglobin A1c  Follow-up Appointments: Follow-up Information    Destiny Lank, MD. Call in 1 week(s).   Specialty:  Family Medicine Why:  Please make an appointment in 1 - 2 weeks Contact information: [address]. [place] [address] [phone]           Hospital Course by problem list:  1. Chest Pain: Patient presented with several days of achy, substernal, and non-radiating, 7/10 CP in the setting of CAD with DES in 2016 and treatment resistant GERD. Her pain was relieved following administration of 3 Nitrostat tablets and fentanyl in the ED. EKG was non-ischemic. Troponin was negative x 3. CXR showed no evidence of pulmonary infiltrate  Patient was placed on telemetry and monitored overnight. TTE showed EF 55 - 60%. She was discharged with plan to follow up with her cardiologist for outpatient stress test.  2. UTI:  Previously prescribed Ciprofloxacin and Prednisone taper for adverse reaction to Bactrim were continued.  Discharge Vitals:   BP 133/77 (BP Location: Left Arm)   Pulse 66   Temp 98 F (36.7 C) (Oral)   Resp 17   Ht 5\' 2"  (1.575 m)   Wt 197 lb (89.4 kg)   SpO2 98%   BMI 36.03 kg/m   Pertinent Labs, Studies, and Procedures:  CBC Latest Ref Rng & Units 02/05/2017 02/04/2017 01/28/2017  WBC 4.0 - 10.5 K/uL 7.0  6.7 7.0  Hemoglobin 12.0 - 15.0 g/dL 12.1 12.0 12.7  Hematocrit 36.0 - 46.0 % 36.2 36.5 37.0  Platelets 150 - 400 K/uL 182 205 187   BMP Latest Ref Rng & Units 02/05/2017 02/04/2017 01/28/2017  Glucose 65 - 99 mg/dL 107(H) 187(H) 97  BUN 6 - 20 mg/dL 15 15 15   Creatinine 0.44 - 1.00 mg/dL 0.67 0.98 0.80  Sodium 135 - 145 mmol/L 140 137 139  Potassium 3.5 - 5.1 mmol/L 3.7 3.4(L) 3.8  Chloride 101 - 111 mmol/L 107 102 102  CO2 22 - 32 mmol/L 27 24 29   Calcium 8.9 - 10.3 mg/dL 9.0 9.1 9.3   Troponin: Negative x3  CXR: IMPRESSION: Minimal chronic bronchitic changes without acute infiltrate.  Echocardiogram: Study Conclusions: - Left ventricle: The cavity size was normal. Systolic function was normal. The estimated ejection fraction was in the range of 55% to 60%. Wall motion was normal; there were no regional wall motion abnormalities. - Mitral valve: There was mild regurgitation.  Discharge Instructions: Discharge Instructions    Call MD for:  severe uncontrolled pain    Complete by:  As directed    Diet - low sodium heart healthy    Complete by:  As directed    Discharge instructions    Complete by:  As directed    Thank you for allowing [place] to care for you.   The Echocardiogram (ultrasound of your heart) was normal.  Please follow up with your regular cardiologist concerning: - Your stay here - Your echocardiogram results - To undergo a recommended stress test  Please follow up with your primary care provider about your stay in the hospital.   Increase activity slowly    Complete by:  As directed       Signed: Neva Seat, DO IM PGY-1 Pager: [phone]

## 2017-02-05 NOTE — Progress Notes (Signed)
Patient ID: QUENNA DOEPKE, female   DOB: 1956-04-18, 61 y.o.   MRN: [id number]   Subjective: Mrs. Steffensmeier feels better this morning. She denies chest pain or shortness of breath. She denies nausea and vomiting. Cardiology had already been by to see her this morning. We discussed the plan of getting an echo today and following up with cardiology as an outpatient.  Objective:  Vital signs in last 24 hours: Vitals:   02/05/17 0033 02/05/17 0100 02/05/17 0454 02/05/17 1000  BP:  124/75 130/71 133/77  Pulse:  (!) 52  66  Resp:  17  17  Temp: 98 F (36.7 C)  98 F (36.7 C)   TempSrc: Oral  Oral   SpO2:  98%  98%  Weight:   197 lb (89.4 kg)   Height:       Physical Exam  Constitutional: She is oriented to person, place, and time. She appears well-developed and well-nourished.  HENT:  Head: Normocephalic and atraumatic.  Eyes: Conjunctivae and EOM are normal.  Cardiovascular: Normal rate, regular rhythm and normal heart sounds.  Exam reveals no gallop and no friction rub.   No murmur heard. Pulmonary/Chest: Effort normal and breath sounds normal. No respiratory distress. She has no wheezes. She has no rales.  Abdominal: Soft. Bowel sounds are normal. She exhibits no distension. There is no tenderness.  Musculoskeletal: She exhibits no edema or deformity.  Neurological: She is alert and oriented to person, place, and time.  Skin: Skin is warm and dry.    Assessment/Plan:  Chest Pain: In the setting of recently worsened GERD symptoms on antibiotics for UTI with a Hx of CAD s/p DES 2016. Initial EKG: non-ischemic. CXR: no evidence of pulmonary infiltrate. Troponin: neg x 3 - Cardiac Monitoring - TTE  CAD, HTN, HLD: Continue home medications: - Metoprolol tartrate 12.5mg  BID - Aspirin 81 mg - Rosuvastatin 20 mg daily - Lasix 20 mg BID  UTI: With recent adverse reaction to Bactrim, patient was switched to Cipro and put on a prednisone taper. - Secondary to pessary device -  Continue Ciprofloxacin 250 mg BID - Continue Prednisone taper  Hyperglycemia: CMET glucose 187 -Most likely secondary to Prednisone -Obtain A1C  Leg Cramps, nocturnal / Jaw locking - Likely having muscle spasms - No signs of DVT, good perfusion to legs bilaterally - CMET WNL, K+ 3.4, most likely not due to electrolyte abnormality - Can consider muscle relaxant - Can be worked up as outpatient   F/E/N: Heart Diet  DVT Prophylaxis: Lovenox 40mg   Code Status: Full  Dispo: Anticipated discharge in approximately 0 - 1 day(s).   Neva Seat, DO IM PGY-1 Pager: [phone]

## 2017-02-05 NOTE — Consult Note (Signed)
Cardiology Consult    Patient ID: Destiny [name] MRN: [id number], DOB/AGE: Jun 14, 1956   Admit date: 02/04/2017 Date of Consult: 02/05/2017  Primary Physician: [name], MD Primary Cardiologist: Gaspar Cola Requesting Provider: Dareen Piano Reason for Consultation: Chest pain  Destiny [name] is a 61 y.o. female who is being seen today for the evaluation of chest pain at the request of Dr. Dareen Piano.   Patient Profile    61 yo female with PMH of CAD s/p PCI with DES x2 mLAD (2016), HTN, HL, OSA who presented with jaw tightness, and left leg pain.   Past Medical History   Past Medical History:  Diagnosis Date  . CAD (coronary artery disease)   . CVA (cerebral infarction)   . Female bladder prolapse   . GERD (gastroesophageal reflux disease)   . Hyperlipidemia   . Hypertension   . Mitral valve disease   . Sleep apnea   . Stroke [facility])    x3    Past Surgical History:  Procedure Laterality Date  . CARPAL TUNNEL RELEASE Bilateral   . CHOLECYSTECTOMY    . COLON SURGERY    . TENNIS ELBOW RELEASE/NIRSCHEL PROCEDURE Left   . TONSILLECTOMY       Allergies  Allergies  Allergen Reactions  . Atorvastatin Hives    No s/sx of anaphylaxis, just intermittent hives.  Does not seem to be a class effect as she has tolerated lovastatin previously  . Bactrim [Sulfamethoxazole-Trimethoprim] Rash and Other (See Comments)    "felt hot"  . Diflucan [Fluconazole] Rash    Reaction to generic but not name brand  . Sertraline Itching  . Sulfa Antibiotics Rash and Other (See Comments)    "hot " sensation    History of Present Illness    Mrs. Destiny [name] is a 61 yo female who is followed by cardiology in [place]. States she had PCI and DES x2 to the [place] on 9/16. Placed on DAPT with ASA/plavix post cath. Reports she has been doing generally well since that time. Was admitted last year around this same time 7/18. Seen by Dr. Saralyn Pilar and had a normal stress test and echo. She works as a [employer]ar[employer] manager  and does not normally experience any anginal symptoms while working or at rest. Currently staying at hotel, but hoping to move into an apartment within a couple of months.   Reports she felt her left jaw "lock up" on Tuesday and left leg cramping. States the leg cramping has been intermittent, and wakes her up at night. Also had intermittent episodes of this jaw tightness as well. Reports mild intermittent episodes of chest "twinges" of pain, but says these are not similar in nature to what she experienced with her previous stenting in 2016. Also has had several episodes of sweating over the past week, that have been waking her up at night. Takes lasix daily, but states she has not experienced any significant LE edema or dyspnea.  In the ED her labs showed K+ 3.4, Trop negx3, Hgb 12.0. CXR without edema. EKG showed SR without acute ST/T wave changes. No further chest pain.  Inpatient Medications    . aspirin EC  81 mg Oral BH-q7a  . ciprofloxacin  250 mg Oral BID  . enoxaparin (LOVENOX) injection  40 mg Subcutaneous Q24H  . furosemide  20 mg Oral Daily  . gabapentin  1,800 mg Oral QHS  . loratadine  10 mg Oral Daily  . metoprolol tartrate  12.5 mg Oral BID  .  pantoprazole  40 mg Oral Daily  . predniSONE  20 mg Oral Q breakfast  . rosuvastatin  20 mg Oral QHS    Family History    Family History  Problem Relation Age of Onset  . Heart disease Other   . Hypertension Other     Social History    Social History   Social History  . Marital status: Married    Spouse name: N/A  . Number of children: N/A  . Years of education: N/A   Occupational History  . Not on file.   Social History Main Topics  . Smoking status: Never Smoker  . Smokeless tobacco: Never Used  . Alcohol use No  . Drug use: No  . Sexual activity: Not Currently   Other Topics Concern  . Not on file   Social History Narrative  . No narrative on file     Review of Systems    See HPI  All other systems  reviewed and are otherwise negative except as noted above.  Physical Exam    Blood pressure 130/71, pulse (!) 52, temperature 98 F (36.7 C), temperature source Oral, resp. rate 17, height 5\' 2"  (1.575 m), weight 197 lb (89.4 kg), SpO2 98 %.  General: Pleasant obese WF, NAD Psych: Normal affect. Neuro: Alert and oriented X 3. Moves all extremities spontaneously. HEENT: Normal  Neck: Supple without bruits or JVD. Lungs:  Resp regular and unlabored, CTA. Heart: RRR no s3, s4, or murmurs. Abdomen: Soft, non-tender, non-distended, BS + x 4.  Extremities: No clubbing, cyanosis 1+ LE edema. DP/PT/Radials 2+ and equal bilaterally.  Labs    Troponin [facility] Test)  Recent Labs  02/04/17 1804  TROPIPOC 0.00    Recent Labs  02/05/17 0121 02/05/17 0554  TROPONINI <0.03 <0.03   Lab Results  Component Value Date   WBC 7.0 02/05/2017   HGB 12.1 02/05/2017   HCT 36.2 02/05/2017   MCV 92.3 02/05/2017   PLT 182 02/05/2017    Recent Labs Lab 02/05/17 0554  NA 140  K 3.7  CL 107  CO2 27  BUN 15  CREATININE 0.67  CALCIUM 9.0  GLUCOSE 107*   Lab Results  Component Value Date   CHOL 93 02/15/2016   HDL 42 02/15/2016   LDLCALC 23 02/15/2016   TRIG 142 02/15/2016   No results found for: [facility]   Radiology Studies    Dg Chest 2 View  Result Date: 02/04/2017 CLINICAL DATA:  Midsternal chest pain with dry cough, severe hot flashes/diaphoresis and heartburn for 1-2 days, history coronary artery disease post stenting, hypertension EXAM: CHEST  2 VIEW COMPARISON:  01/28/2017 FINDINGS: Normal heart size, mediastinal contours, and pulmonary vascularity. Coronary stent projects over LEFT heart. Minimal chronic peribronchial thickening. No pulmonary infiltrate, pleural effusion or pneumothorax. Bones demineralized. IMPRESSION: Minimal chronic bronchitic changes without acute infiltrate. Electronically Signed   By: Lavonia Dana M.D.   On: 02/04/2017 18:22    ECG & Cardiac  Imaging    EKG: SR  Echo: 02/15/16  Study Conclusions  - Procedure narrative: Transthoracic echocardiography. The study   was technically difficult. Intravenous contrast (Definity) was   administered. - Left ventricle: There was mild concentric hypertrophy. Systolic   function was normal. The estimated ejection fraction was in the   range of 60% to 65%. - Aortic valve: Valve area (Vmax): 2.47 cm^2. - Mitral valve: There was mild regurgitation.  Cath: 9/16  Findings: 1. Significant 1-vessel coronary artery disease including  a long calcified  80% mid LAD stenosis and a calcified 90% distal LAD stenosis. These  lesions were shown to be hemodynamically significant by recent FFR  evaluation. 2. Successful PCI to the mid LAD with orbital atherectomy and placement of  a Resolute drug eluting stent with an excellent angiographic result and  TIMI 3 flow. 3. Successful PCI to the distal LAD with placement of a Resolute drug  eluting stent with an excellent angiographic result and TIMI 3 flow.  Recommendations: 1. Aggressive secondary prevention. 2. Dual antiplatelet therapy with aspirin 81 mg daily and clopidogrel 75  mg daily for at least 12 months, ideally longer. 3. Follow up with primary cardiologist.  Assessment & Plan    61 yo female with PMH of CAD s/p PCI with DES x2 mLAD (2016), HTN, HL, OSA who presented with jaw tightness, and left leg pain.   1. Jaw tightness/leg cramping: Reports several episodes of feeling like her left jaw was "locking up" and left leg cramping waking her up at night. Very atypical symptoms. Mildly hypokalemic on admission, corrected on admission. No further episodes. Does have hx of CAD but states these symptoms are not similar to what she experienced with previous stenting.   2. CAD s/p DES mLAD (2016): done at [place]. Recent stress test in 2017 that was normal. Echo at that time with normal EF. EKG nonacute, Trop neg x2.  -- continue medical  therapy with ASA, statin, BB -- consider checking echo this admission for new WMA, or decreased EF  3. HTN: stable with current therapy  4. HL: continue statin  5. OSA: reports using Cpap in that past but has not been using recently as she has been living in a hotel.   Barnet Pall, NP-C Pager [phone] 02/05/2017, 7:45 AM   Patient seen and examined, history taken and chart reviewed.  Additional history to that above is that she her symptoms of chest twinges last night lasted around 30 minutes and the pain would come and go.  It was different than her previous pain that led to her stenting a couple of years ago.  She also stated that when she woke up yesterday morning that she did not feel well and felt like things were different last night.  On examination her lungs were clear cardiac exam showed no murmur and she had no edema and her pulses were normal and her carotids did not have a bruit.  1.  Atypical chest discomfort as a normal EKG and negative troponins 2.  CAD with previous stenting of the LAD in 2 places 3.  Obesity  Recommendations:  Recommend echo to be sure no new wall motion abnormality but otherwise I think she should follow-up with her cardiologist and obtain an outpatient stress test to evaluate her atypical symptoms.

## 2017-02-05 NOTE — Progress Notes (Signed)
  Echocardiogram 2D Echocardiogram has been performed.  Destiny [name] 02/05/2017, 3:27 PM

## 2017-02-08 LAB — HEMOGLOBIN A1C
Hgb A1c MFr Bld: 5.8 % — ABNORMAL HIGH (ref 4.8–5.6)
MEAN PLASMA GLUCOSE: 120 mg/dL

## 2017-02-08 LAB — HIV ANTIBODY (ROUTINE TESTING W REFLEX): HIV Screen 4th Generation wRfx: NONREACTIVE

## 2017-02-11 ENCOUNTER — Emergency Department
Admission: EM | Admit: 2017-02-11 | Discharge: 2017-02-11 | Disposition: A | Discharge status: Home / Self Care | Payer: Self-pay | Attending: Emergency Medicine | Admitting: Emergency Medicine

## 2017-02-11 DIAGNOSIS — Z8673 Personal history of transient ischemic attack (TIA), and cerebral infarction without residual deficits: Secondary | ICD-10-CM | POA: Insufficient documentation

## 2017-02-11 DIAGNOSIS — N3000 Acute cystitis without hematuria: Secondary | ICD-10-CM

## 2017-02-11 DIAGNOSIS — N3090 Cystitis, unspecified without hematuria: Secondary | ICD-10-CM | POA: Insufficient documentation

## 2017-02-11 DIAGNOSIS — Z7982 Long term (current) use of aspirin: Secondary | ICD-10-CM | POA: Insufficient documentation

## 2017-02-11 DIAGNOSIS — Z79899 Other long term (current) drug therapy: Secondary | ICD-10-CM | POA: Insufficient documentation

## 2017-02-11 DIAGNOSIS — I10 Essential (primary) hypertension: Secondary | ICD-10-CM | POA: Insufficient documentation

## 2017-02-11 DIAGNOSIS — I2510 Atherosclerotic heart disease of native coronary artery without angina pectoris: Secondary | ICD-10-CM | POA: Insufficient documentation

## 2017-02-11 LAB — URINALYSIS, COMPLETE (UACMP) WITH MICROSCOPIC
BACTERIA UA: NONE SEEN
BILIRUBIN URINE: NEGATIVE
Glucose, UA: NEGATIVE mg/dL
Hgb urine dipstick: NEGATIVE
KETONES UR: NEGATIVE mg/dL
Nitrite: NEGATIVE
Protein, ur: NEGATIVE mg/dL
SPECIFIC GRAVITY, URINE: 1.017 (ref 1.005–1.030)
pH: 5 (ref 5.0–8.0)

## 2017-02-11 MED ORDER — CEPHALEXIN 500 MG PO CAPS: 500.0000 mg | ORAL_CAPSULE | Freq: Three times a day (TID) | ORAL | 0 refills | Status: DC

## 2017-02-11 NOTE — ED Notes (Signed)
Pt verbalized understanding of discharge instructions. NAD at this time. 

## 2017-02-11 NOTE — Discharge Instructions (Signed)
Keep your Appointment with Dr. Posey Pronto on Monday. Urine culture was done today. Begin taking Keflex 500 mg 3 times a day for 10 days. Increase fluids. Have your blood pressure rechecked at Dr. Serita Grit office.  Today your  blood pressure was elevated at 120/105.

## 2017-02-11 NOTE — ED Provider Notes (Signed)
[facility] Emergency Department Provider Note  ____________________________________________   First MD Initiated Contact with Patient 02/11/17 0900     (approximate)  I have reviewed the triage vital signs and the nursing notes.   HISTORY  Chief Complaint Hot Flashes and Urinary Frequency    HPI Destiny [name] is a 61 y.o. female is here with complaint of urinary frequency and burning. Patient is unaware of any fever or chills, nausea, vomiting or flank pain. She states she has had hot flashes.Patient states that she recently had a urinary tract infection and was placed initially on Bactrim DS. She came to the emergency room with complaint of rash. She was then discontinued off the Bactrim given Benadryl and started on Cipro which she states she finished last evening. Patient states she has an appointment with her PCP on Monday.   Past Medical History:  Diagnosis Date  . CAD (coronary artery disease)   . CVA (cerebral infarction)   . Female bladder prolapse   . GERD (gastroesophageal reflux disease)   . Hyperlipidemia   . Hypertension   . Mitral valve disease   . Sleep apnea   . Stroke [facility])    x3    Patient Active Problem List   Diagnosis Date Noted  . Chest pain 02/14/2016  . Small bowel obstruction ([place]) 06/16/2015    Past Surgical History:  Procedure Laterality Date  . CARPAL TUNNEL RELEASE Bilateral   . CHOLECYSTECTOMY    . COLON SURGERY    . TENNIS ELBOW RELEASE/NIRSCHEL PROCEDURE Left   . TONSILLECTOMY      Prior to Admission medications   Medication Sig Start Date End Date Taking? Authorizing Provider  acetaminophen (TYLENOL) 500 MG tablet Take 1,000 mg by mouth every 6 (six) hours as needed for headache.     [provider]  aspirin EC 81 MG tablet Take 81 mg by mouth every morning.     [provider]  cephALEXin (KEFLEX) 500 MG capsule Take 1 capsule (500 mg total) by mouth 3 (three) times daily. 02/11/17    Johnn Hai, PA-C  cetirizine (ZYRTEC) 10 MG tablet Take 1 tablet (10 mg total) by mouth daily. Patient taking differently: Take 10 mg by mouth daily as needed for allergies.  12/22/16   Triplett, Johnette Abraham B, FNP  Cholecalciferol (VITAMIN D3) 5000 UNITS CAPS Take 5,000 Units by mouth daily.    [provider]  Docusate Calcium (STOOL SOFTENER PO) Take 1 capsule by mouth daily.    [provider]  esomeprazole (NEXIUM) 40 MG capsule Take 40 mg by mouth 2 (two) times daily.    [provider]  furosemide (LASIX) 20 MG tablet Take 1 tablet (20 mg total) by mouth every other day. Patient taking differently: Take 20-40 mg by mouth See admin instructions. Take 1-2 tablets (20-40 mg) by mouth once or twice daily (depending on leg, foot and hand swelling -  1 tablet (20 mg) daily, 2 tablets (40 mg) in the am if swelling, sometimes 1 tablet (20 mg) at 3pm if needed for swelling) 02/15/16   Sudini, Alveta Heimlich, MD  gabapentin (NEURONTIN) 600 MG tablet Take 1,800 mg by mouth at bedtime.     [provider]  ibuprofen (ADVIL,MOTRIN) 600 MG tablet Take 1 tablet (600 mg total) by mouth every 8 (eight) hours as needed. Patient taking differently: Take 600 mg by mouth every 8 (eight) hours as needed for headache (pain).  08/01/16   Sable Feil,  PA-C  IRON PO Take 1 tablet by mouth daily.    [provider]  metoprolol tartrate (LOPRESSOR) 25 MG tablet Take 0.5 tablets (12.5 mg total) by mouth 2 (two) times daily. 02/15/16   Hillary Bow, MD  nitroGLYCERIN (NITROSTAT) 0.4 MG SL tablet Place 0.4 mg under the tongue every 5 (five) minutes as needed for chest pain.    [provider]  potassium chloride SA (K-DUR,KLOR-CON) 20 MEQ tablet Take 20 mEq by mouth daily.    [provider]  rosuvastatin (CRESTOR) 20 MG tablet Take 20 mg by mouth at bedtime.     [provider]  triamcinolone (KENALOG) 0.025 % ointment Apply 1 application topically 2 (two)  times daily. Patient taking differently: Apply 1 application topically 2 (two) times daily as needed (itching/rash).  02/01/17   Laban Emperor, PA-C    Allergies Atorvastatin; Bactrim [sulfamethoxazole-trimethoprim]; Diflucan [fluconazole]; Sertraline; and Sulfa antibiotics  Family History  Problem Relation Age of Onset  . Heart disease Other   . Hypertension Other     Social History Social History  Substance Use Topics  . Smoking status: Never Smoker  . Smokeless tobacco: Never Used  . Alcohol use No    Review of Systems Constitutional: No fever/chills Cardiovascular: Denies chest pain. Respiratory: Denies shortness of breath. Gastrointestinal: No abdominal pain.  No nausea, no vomiting.  Negative for flank pain. Genitourinary: Positive for dysuria. Negative for vaginal discharge. Musculoskeletal: Negative for back pain. Skin: Negative for rash. Neurological: Negative for headaches, focal weakness or numbness.   ____________________________________________   PHYSICAL EXAM:  VITAL SIGNS: ED Triage Vitals  Enc Vitals Group     BP 02/11/17 0819 (!) 120/105     Pulse Rate 02/11/17 0819 70     Resp 02/11/17 0819 16     Temp 02/11/17 0819 98.7 F (37.1 C)     Temp Source 02/11/17 0819 Oral     SpO2 02/11/17 0819 96 %     Weight 02/11/17 0819 197 lb (89.4 kg)     Height 02/11/17 0819 5\' 2"  (1.575 m)     Head Circumference --      Peak Flow --      Pain Score 02/11/17 0818 7     Pain Loc --      Pain Edu? --      Excl. in [place]? --     Constitutional: Alert and oriented. Well appearing and in no acute distress. Eyes: Conjunctivae are normal. PERRL. EOMI. Head: Atraumatic. Neck: No stridor.   Cardiovascular: Normal rate, regular rhythm. Grossly normal heart sounds.  Good peripheral circulation. Respiratory: Normal respiratory effort.  No retractions. Lungs CTAB. Gastrointestinal: Soft and nontender. No distention.No CVA tenderness. Musculoskeletal: Moves upper and  lower extremities without any difficulty. Normal gait was noted. Neurologic:  Normal speech and language. No gross focal neurologic deficits are appreciated.  Skin:  Skin is warm, dry and intact. No rash noted. Psychiatric: Mood and affect are normal. Speech and behavior are normal.  ____________________________________________   LABS (all labs ordered are listed, but only abnormal results are displayed)  Labs Reviewed  URINALYSIS, COMPLETE (UACMP) WITH MICROSCOPIC - Abnormal; Notable for the following:       Result Value   Color, Urine YELLOW (*)    APPearance HAZY (*)    Leukocytes, UA MODERATE (*)    Squamous Epithelial / LPF 0-5 (*)    All other components within normal limits  URINE CULTURE    PROCEDURES  Procedure(s) performed:  None  Procedures  Critical Care performed: No  ____________________________________________   INITIAL IMPRESSION / ASSESSMENT AND PLAN / ED COURSE  Pertinent labs & imaging results that were available during my care of the patient were reviewed by me and considered in my medical decision making (see chart for details).  Patient was encouraged keep her appointment with Dr. Posey Pronto on Monday and also discussed her "hot flashes" with her PCP. Patient was started on Keflex 500 mg 3 times a day for 10 days. Culture and sensitivity was also sent. Patient is encouraged to drink plenty of fluids.   ___________________________________________   FINAL CLINICAL IMPRESSION(S) / ED DIAGNOSES  Final diagnoses:  Acute cystitis without hematuria      NEW MEDICATIONS STARTED DURING THIS VISIT:  Discharge Medication List as of 02/11/2017  9:47 AM    START taking these medications   Details  cephALEXin (KEFLEX) 500 MG capsule Take 1 capsule (500 mg total) by mouth 3 (three) times daily., Starting Sat 02/11/2017, Print         Note:  This document was prepared using Dragon voice recognition software and may include unintentional dictation  errors.    Johnn Hai, PA-C 02/11/17 [place], [place], MD 02/11/17 [phone]

## 2017-02-11 NOTE — ED Triage Notes (Signed)
Hot flashes and burning with urination since Monday. Pt finished antibiotics last night for UTI. No medications taken PTA

## 2017-02-11 NOTE — ED Notes (Signed)
Pt ambulated to room and bathroom without assistance.

## 2017-02-13 LAB — URINE CULTURE: Special Requests: NORMAL

## 2017-02-17 ENCOUNTER — Emergency Department
Admission: EM | Admit: 2017-02-17 | Discharge: 2017-02-17 | Disposition: A | Discharge status: Home / Self Care | Payer: Self-pay | Attending: Emergency Medicine | Admitting: Emergency Medicine

## 2017-02-17 DIAGNOSIS — R300 Dysuria: Secondary | ICD-10-CM | POA: Insufficient documentation

## 2017-02-17 DIAGNOSIS — I10 Essential (primary) hypertension: Secondary | ICD-10-CM | POA: Insufficient documentation

## 2017-02-17 DIAGNOSIS — I2510 Atherosclerotic heart disease of native coronary artery without angina pectoris: Secondary | ICD-10-CM | POA: Insufficient documentation

## 2017-02-17 DIAGNOSIS — Z7982 Long term (current) use of aspirin: Secondary | ICD-10-CM | POA: Insufficient documentation

## 2017-02-17 DIAGNOSIS — Z79899 Other long term (current) drug therapy: Secondary | ICD-10-CM | POA: Insufficient documentation

## 2017-02-17 LAB — URINALYSIS, COMPLETE (UACMP) WITH MICROSCOPIC
BILIRUBIN URINE: NEGATIVE
Bacteria, UA: NONE SEEN
Glucose, UA: NEGATIVE mg/dL
Ketones, ur: NEGATIVE mg/dL
Nitrite: NEGATIVE
PROTEIN: NEGATIVE mg/dL
Specific Gravity, Urine: 1.004 — ABNORMAL LOW (ref 1.005–1.030)
pH: 6 (ref 5.0–8.0)

## 2017-02-17 MED ORDER — PHENAZOPYRIDINE HCL 200 MG PO TABS: 200.0000 mg | ORAL_TABLET | Freq: Three times a day (TID) | ORAL | 0 refills | Status: DC | PRN

## 2017-02-17 NOTE — ED Provider Notes (Signed)
[facility] Emergency Department Provider Note       Time seen: ----------------------------------------- 8:42 AM on 02/17/2017 -----------------------------------------     I have reviewed the triage vital signs and the nursing notes.   HISTORY   Chief Complaint Urinary Tract Infection    HPI Destiny [name] is a 61 y.o. female who presents to the ED for UTI symptoms. Patient was seen here about a week ago was diagnosed with a urinary tract infection. Urine culture grew out several bacteria. She's been taking medication and antibiotics without any improvement in her symptoms. Patient complains of whole body burning, nothing makes it better or worse. Pain is 8 out of 10.   Past Medical History:  Diagnosis Date  . CAD (coronary artery disease)   . CVA (cerebral infarction)   . Female bladder prolapse   . GERD (gastroesophageal reflux disease)   . Hyperlipidemia   . Hypertension   . Mitral valve disease   . Sleep apnea   . Stroke [facility])    x3    Patient Active Problem List   Diagnosis Date Noted  . Chest pain 02/14/2016  . Small bowel obstruction ([place]) 06/16/2015    Past Surgical History:  Procedure Laterality Date  . CARPAL TUNNEL RELEASE Bilateral   . CHOLECYSTECTOMY    . COLON SURGERY    . TENNIS ELBOW RELEASE/NIRSCHEL PROCEDURE Left   . TONSILLECTOMY      Allergies Atorvastatin; Bactrim [sulfamethoxazole-trimethoprim]; Diflucan [fluconazole]; Sertraline; and Sulfa antibiotics  Social History Social History  Substance Use Topics  . Smoking status: Never Smoker  . Smokeless tobacco: Never Used  . Alcohol use No    Review of Systems Constitutional: Negative for fever. Cardiovascular: Negative for chest pain. Respiratory: Negative for shortness of breath. Gastrointestinal: Negative for abdominal pain, vomiting and diarrhea. Genitourinary:Positive for dysuria Musculoskeletal: Negative for back pain.Positive for burning  pain Skin: Negative for rash. Neurological: Negative for headaches, focal weakness or numbness.  All systems negative/normal/unremarkable except as stated in the HPI  ____________________________________________   PHYSICAL EXAM:  VITAL SIGNS: ED Triage Vitals  Enc Vitals Group     BP --      Pulse --      Resp --      Temp --      Temp src --      SpO2 --      Weight 02/17/17 0838 197 lb (89.4 kg)     Height --      Head Circumference --      Peak Flow --      Pain Score 02/17/17 0840 8     Pain Loc --      Pain Edu? --      Excl. in [place]? --     Constitutional: Alert and oriented. Well appearing and in no distress. Eyes: Conjunctivae are normal. Normal extraocular movements. Cardiovascular: Normal rate, regular rhythm. No murmurs, rubs, or gallops. Respiratory: Normal respiratory effort without tachypnea nor retractions. Breath sounds are clear and equal bilaterally. No wheezes/rales/rhonchi. Gastrointestinal: Soft and nontender. Normal bowel sounds Musculoskeletal: Nontender with normal range of motion in extremities. No lower extremity tenderness nor edema. Neurologic:  Normal speech and language. No gross focal neurologic deficits are appreciated.  Skin:  Skin is warm, dry and intact. No rash noted. Psychiatric: Mood and affect are normal. Speech and behavior are normal.  ____________________________________________  ED COURSE:  Pertinent labs & imaging results that were available during my care of the patient were reviewed  by me and considered in my medical decision making (see chart for details). Patient presents for Dysuria, we will assess with labs as indicated   Procedures ____________________________________________   LABS (pertinent positives/negatives)  Labs Reviewed  URINALYSIS, COMPLETE (UACMP) WITH MICROSCOPIC - Abnormal; Notable for the following:       Result Value   Color, Urine STRAW (*)    APPearance CLEAR (*)    Specific Gravity, Urine 1.004  (*)    Hgb urine dipstick SMALL (*)    Leukocytes, UA SMALL (*)    Squamous Epithelial / LPF 0-5 (*)    All other components within normal limits  CHLAMYDIA/NGC RT PCR ([facility] ONLY)  ____________________________________________  FINAL ASSESSMENT AND PLAN  Dysuria  Plan: Patient's labs  were dictated above. Patient had presented for Dysuria of uncertain etiology. I will prescribe Pyridium to take for her symptoms. At this point there is no evidence of UTI and this was likely a better collection than before.   Earleen Newport, MD   Note: This note was generated in part or whole with voice recognition software. Voice recognition is usually quite accurate but there are transcription errors that can and very often do occur. I apologize for any typographical errors that were not detected and corrected.     Earleen Newport, MD 02/17/17 1031

## 2017-02-17 NOTE — ED Triage Notes (Signed)
Pt reports that she was seen here about 1 week ago with UTI, culture came back with multiple bacteria present. Pt was placed on abx by PCP on 7/28 and has been taking it since without relief from sx's.

## 2017-07-10 ENCOUNTER — Emergency Department
Admission: EM | Admit: 2017-07-10 | Discharge: 2017-07-10 | Disposition: A | Discharge status: Home / Self Care | Payer: Worker's Compensation | Attending: Emergency Medicine | Admitting: Emergency Medicine

## 2017-07-10 ENCOUNTER — Encounter: Payer: Self-pay | Admitting: Emergency Medicine

## 2017-07-10 ENCOUNTER — Emergency Department: Payer: Worker's Compensation

## 2017-07-10 DIAGNOSIS — M5441 Lumbago with sciatica, right side: Secondary | ICD-10-CM | POA: Diagnosis not present

## 2017-07-10 DIAGNOSIS — I10 Essential (primary) hypertension: Secondary | ICD-10-CM | POA: Insufficient documentation

## 2017-07-10 DIAGNOSIS — Z7982 Long term (current) use of aspirin: Secondary | ICD-10-CM | POA: Insufficient documentation

## 2017-07-10 DIAGNOSIS — Z79899 Other long term (current) drug therapy: Secondary | ICD-10-CM | POA: Diagnosis not present

## 2017-07-10 DIAGNOSIS — I2510 Atherosclerotic heart disease of native coronary artery without angina pectoris: Secondary | ICD-10-CM | POA: Insufficient documentation

## 2017-07-10 DIAGNOSIS — Z8673 Personal history of transient ischemic attack (TIA), and cerebral infarction without residual deficits: Secondary | ICD-10-CM | POA: Insufficient documentation

## 2017-07-10 DIAGNOSIS — M545 Low back pain: Secondary | ICD-10-CM | POA: Diagnosis present

## 2017-07-10 MED ORDER — CYCLOBENZAPRINE HCL 10 MG PO TABS
5.0000 mg | ORAL_TABLET | Freq: Once | ORAL | Status: AC
  Administered 2017-07-10: 5 mg via ORAL
  Filled 2017-07-10: qty 1

## 2017-07-10 MED ORDER — CYCLOBENZAPRINE HCL 5 MG PO TABS: 5.0000 mg | ORAL_TABLET | Freq: Three times a day (TID) | ORAL | 0 refills | Status: AC | PRN

## 2017-07-10 MED ORDER — PREDNISONE 50 MG PO TABS: ORAL_TABLET | ORAL | 0 refills | Status: DC

## 2017-07-10 NOTE — ED Notes (Signed)
Pt fell while walking legs slipped out from under her and landed on her lower back. Pt is A/O x4 and NAD. Awaiting EDP.

## 2017-07-10 NOTE — ED Notes (Signed)
Pt states her employer knows she is here. Touched by and Glenard Haring is Psychologist, educational.

## 2017-07-10 NOTE — ED Triage Notes (Signed)
Patient presents to ED via POV from home. Patient here with lower back pain. Patient reports her back has been bothering her x 2 weeks after she slipped and fell on the snow. Ambulatory to triage.

## 2017-07-10 NOTE — ED Notes (Signed)
Pt gave this RN the number [phone] to contact Pam Parrott to confirm WC, and to verify the want of a WC work up due to the time it has been since the fall. Marissa Calamity stated yes to the work up but is confused bc pt stated she didn't want to see doctor or go to Hospital when the fall occurred. A WC workup will be done. Per Pam request.

## 2017-07-11 NOTE — ED Provider Notes (Signed)
[facility] Emergency Department Provider Note  ____________________________________________  Time seen: Approximately 12:16 AM  I have reviewed the triage vital signs and the nursing notes.   HISTORY  Chief Complaint Back Pain    HPI Destiny [name] is a 61 y.o. 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  Patient reports radiculopathy into the right buttocks.  She denies weakness or bowel or bladder incontinence.  No saddle anesthesia.  No skin compromise.  No alleviating measures were attempted.  Past Medical History:  Diagnosis Date  . CAD (coronary artery disease)   . CVA (cerebral infarction)   . Female bladder prolapse   . GERD (gastroesophageal reflux disease)   . Hyperlipidemia   . Hypertension   . Mitral valve disease   . Sleep apnea   . Stroke [facility])    x3    Patient Active Problem List   Diagnosis Date Noted  . Chest pain 02/14/2016  . Small bowel obstruction ([place]) 06/16/2015    Past Surgical History:  Procedure Laterality Date  . CARPAL TUNNEL RELEASE Bilateral   . CHOLECYSTECTOMY    . COLON SURGERY    . TENNIS ELBOW RELEASE/NIRSCHEL PROCEDURE Left   . TONSILLECTOMY      Prior to Admission medications   Medication Sig Start Date End Date Taking? Authorizing Provider  acetaminophen (TYLENOL) 500 MG tablet Take 1,000 mg by mouth every 6 (six) hours as needed for headache.     [provider]  aspirin EC 81 MG tablet Take 81 mg by mouth every morning.     [provider]  cephALEXin (KEFLEX) 500 MG capsule Take 1 capsule (500 mg total) by mouth 3 (three) times daily. 02/11/17   Johnn Hai, PA-C  cetirizine (ZYRTEC) 10 MG tablet Take 1 tablet (10 mg total) by mouth daily. Patient taking differently: Take 10 mg by mouth daily as needed for allergies.  12/22/16   Triplett, Johnette Abraham B, FNP  Cholecalciferol (VITAMIN D3) 5000 UNITS CAPS  Take 5,000 Units by mouth daily.    [provider]  cyclobenzaprine (FLEXERIL) 5 MG tablet Take 1 tablet (5 mg total) by mouth 3 (three) times daily as needed for up to 3 days for muscle spasms. 07/10/17 07/13/17  Lannie Fields, PA-C  Docusate Calcium (STOOL SOFTENER PO) Take 1 capsule by mouth daily.    [provider]  esomeprazole (NEXIUM) 40 MG capsule Take 40 mg by mouth 2 (two) times daily.    [provider]  furosemide (LASIX) 20 MG tablet Take 1 tablet (20 mg total) by mouth every other day. Patient taking differently: Take 20-40 mg by mouth See admin instructions. Take 1-2 tablets (20-40 mg) by mouth once or twice daily (depending on leg, foot and hand swelling -  1 tablet (20 mg) daily, 2 tablets (40 mg) in the am if swelling, sometimes 1 tablet (20 mg) at 3pm if needed for swelling) 02/15/16   Sudini, Alveta Heimlich, MD  gabapentin (NEURONTIN) 600 MG tablet Take 1,800 mg by mouth at bedtime.     [provider]  ibuprofen (ADVIL,MOTRIN) 600 MG tablet Take 1 tablet (600 mg total) by mouth every 8 (eight) hours as needed. Patient taking differently: Take 600 mg by mouth every 8 (eight) hours as needed for headache (pain).  08/01/16   Sable Feil, PA-C  IRON PO Take 1 tablet by mouth daily.  [provider]  metoprolol tartrate (LOPRESSOR) 25 MG tablet Take 0.5 tablets (12.5 mg total) by mouth 2 (two) times daily. 02/15/16   Hillary Bow, MD  nitroGLYCERIN (NITROSTAT) 0.4 MG SL tablet Place 0.4 mg under the tongue every 5 (five) minutes as needed for chest pain.    [provider]  phenazopyridine (PYRIDIUM) 200 MG tablet Take 1 tablet (200 mg total) by mouth 3 (three) times daily as needed for pain. 02/17/17 02/17/18  Earleen Newport, MD  potassium chloride SA (K-DUR,KLOR-CON) 20 MEQ tablet Take 20 mEq by mouth daily.    [provider]  predniSONE (DELTASONE) 50 MG tablet Take one 50 mg tablet once a day for 5 days. 07/10/17    Lannie Fields, PA-C  rosuvastatin (CRESTOR) 20 MG tablet Take 20 mg by mouth at bedtime.     [provider]  traZODone (DESYREL) 50 MG tablet Take 50 mg by mouth at bedtime.    [provider]  triamcinolone (KENALOG) 0.025 % ointment Apply 1 application topically 2 (two) times daily. Patient not taking: Reported on 02/17/2017 02/01/17   Laban Emperor, PA-C    Allergies Atorvastatin; Bactrim [sulfamethoxazole-trimethoprim]; Diflucan [fluconazole]; Sertraline; and Sulfa antibiotics  Family History  Problem Relation Age of Onset  . Heart disease Other   . Hypertension Other     Social History Social History   Tobacco Use  . Smoking status: Never Smoker  . Smokeless tobacco: Never Used  Substance Use Topics  . Alcohol use: No    Alcohol/week: 0.0 oz  . Drug use: No     Review of Systems  Constitutional: No fever/chills Eyes: No visual changes. No discharge ENT: No upper respiratory complaints. Cardiovascular: no chest pain. Respiratory: no cough. No SOB. Gastrointestinal: No abdominal pain.  No nausea, no vomiting.  No diarrhea.  No constipation. Musculoskeletal: Patient has low back pain. Skin: Negative for rash, abrasions, lacerations, ecchymosis. Neurological: Negative for headaches, focal weakness or numbness.   ____________________________________________   PHYSICAL EXAM:  VITAL SIGNS: ED Triage Vitals [07/10/17 1628]  Enc Vitals Group     BP 103/74     Pulse Rate 66     Resp 15     Temp 97.8 F (36.6 C)     Temp Source Oral     SpO2 97 %     Weight 196 lb (88.9 kg)     Height 5\' 2"  (1.575 m)     Head Circumference      Peak Flow      Pain Score 8     Pain Loc      Pain Edu?      Excl. in [place]?      Constitutional: Alert and oriented. Well appearing and in no acute distress. Eyes: Conjunctivae are normal. PERRL. EOMI. Head: Atraumatic. Cardiovascular: Normal rate, regular rhythm. Normal S1 and S2.  Good peripheral  circulation. Respiratory: Normal respiratory effort without tachypnea or retractions. Lungs CTAB. Good air entry to the bases with no decreased or absent breath sounds. Musculoskeletal: Patient has paraspinal muscle tenderness along the lumbar spine.  No midline lumbar spinal tenderness. Neurologic:  Normal speech and language. No gross focal neurologic deficits are appreciated.  Skin:  Skin is warm, dry and intact. No rash noted. Psychiatric: Mood and affect are normal. Speech and behavior are normal. Patient exhibits appropriate insight and judgement.   ____________________________________________   LABS (all labs ordered are listed, but only abnormal results are displayed)  Labs Reviewed - No  data to display ____________________________________________  EKG   ____________________________________________  RADIOLOGY Unk Pinto, personally viewed and evaluated these images (plain radiographs) as part of my medical decision making, as well as reviewing the written report by the radiologist.  Dg Lumbar Spine 2-3 Views  Result Date: 07/10/2017 CLINICAL DATA:  Two week history of persistent low back pain after she slipped and fell outside on the snow at that time. Initial imaging encounter. EXAM: LUMBAR SPINE - 2-3 VIEW COMPARISON:  Bone window images from CT abdomen and pelvis 02/28/2016, 06/16/2015. Lumbar spine x-rays 01/06/2013. FINDINGS: There are 6 non-rib-bearing lumbar vertebrae. Prior chest x-rays have demonstrated 12 rib-bearing thoracic vertebrae. Anatomic alignment. No fractures. Mild disc space narrowing at L5-6 and L6-S1, perhaps slightly progressive since the August, 2017 CT. Facet degenerative changes at L5-6 and L6-S1, unchanged. Spondylosis involving the visualized lower thoracic spine. Aortoiliac atherosclerosis without evidence of aneurysm. IMPRESSION: 1. No acute or subacute osseous abnormality. 2. Mild degenerative disc disease at L5-6 and L6-S1. 3. Please note  that the patient has 6 non-rib-bearing lumbar vertebrae and 12 rib-bearing thoracic vertebrae. Electronically Signed   By: Evangeline Dakin M.D.   On: 07/10/2017 17:00    ____________________________________________    PROCEDURES  Procedure(s) performed:    Procedures    Medications  cyclobenzaprine (FLEXERIL) tablet 5 mg (5 mg Oral Given 07/10/17 1854)     ____________________________________________   INITIAL IMPRESSION / ASSESSMENT AND PLAN / ED COURSE  Pertinent labs & imaging results that were available during my care of the patient were reviewed by me and considered in my medical decision making (see chart for details).  Review of the [place] CSRS was performed in accordance of the [place] prior to dispensing any controlled drugs.     Assessment and plan Low back pain Patient presents to the emergency department with low back pain with radiculopathy into the right buttocks.  No fractures were identified on x-ray examination.  Patient was given Flexeril after she confirmed that she had a driver.  Patient was discharged with Flexeril and prednisone given radiculopathy.  Patient was advised to follow-up with primary care as needed.  All patient questions were answered.     ____________________________________________  FINAL CLINICAL IMPRESSION(S) / ED DIAGNOSES  Final diagnoses:  Acute bilateral low back pain with right-sided sciatica      NEW MEDICATIONS STARTED DURING THIS VISIT:  ED Discharge Orders        Ordered    cyclobenzaprine (FLEXERIL) 5 MG tablet  3 times daily PRN     07/10/17 1847    predniSONE (DELTASONE) 50 MG tablet     07/10/17 1847          This chart was dictated using voice recognition software/Dragon. Despite best efforts to proofread, errors can occur which can change the meaning. Any change was purely unintentional.    Lannie Fields, PA-C 07/11/17 Evlyn Clines, MD 07/11/17 [phone]

## 2017-08-06 ENCOUNTER — Other Ambulatory Visit: Payer: Self-pay

## 2017-08-06 ENCOUNTER — Emergency Department
Admission: EM | Admit: 2017-08-06 | Discharge: 2017-08-06 | Disposition: A | Discharge status: Home / Self Care | Payer: Self-pay | Attending: Emergency Medicine | Admitting: Emergency Medicine

## 2017-08-06 ENCOUNTER — Encounter: Payer: Self-pay | Admitting: Emergency Medicine

## 2017-08-06 ENCOUNTER — Emergency Department: Payer: Self-pay

## 2017-08-06 DIAGNOSIS — I10 Essential (primary) hypertension: Secondary | ICD-10-CM | POA: Insufficient documentation

## 2017-08-06 DIAGNOSIS — J069 Acute upper respiratory infection, unspecified: Secondary | ICD-10-CM | POA: Insufficient documentation

## 2017-08-06 DIAGNOSIS — I2510 Atherosclerotic heart disease of native coronary artery without angina pectoris: Secondary | ICD-10-CM | POA: Insufficient documentation

## 2017-08-06 DIAGNOSIS — Z7982 Long term (current) use of aspirin: Secondary | ICD-10-CM | POA: Insufficient documentation

## 2017-08-06 LAB — INFLUENZA PANEL BY PCR (TYPE A & B)
INFLAPCR: NEGATIVE
INFLBPCR: NEGATIVE

## 2017-08-06 MED ORDER — ACETAMINOPHEN 500 MG PO TABS
ORAL_TABLET | ORAL | Status: AC
  Administered 2017-08-06: 1000 mg via ORAL
  Filled 2017-08-06: qty 2

## 2017-08-06 MED ORDER — ACETAMINOPHEN 500 MG PO TABS
1000.0000 mg | ORAL_TABLET | ORAL | Status: AC
  Administered 2017-08-06: 1000 mg via ORAL

## 2017-08-06 MED ORDER — MENTHOL 3 MG MT LOZG: 1.0000 | LOZENGE | OROMUCOSAL | Status: DC | PRN

## 2017-08-06 MED ORDER — BENZONATATE 100 MG PO CAPS: 100.0000 mg | ORAL_CAPSULE | Freq: Three times a day (TID) | ORAL | 0 refills | Status: DC | PRN

## 2017-08-06 NOTE — Discharge Instructions (Signed)
You have been seen in the Emergency Department (ED) today for a likely viral illness.  Please drink plenty of clear fluids (water, Gatorade, chicken broth, etc).    Please follow up with your doctor as listed above.  Call your doctor or return to the Emergency Department (ED) if you are unable to tolerate fluids due to vomiting, have worsening trouble breathing, become extremely tired or difficult to awaken, or if you develop any other symptoms that concern you.

## 2017-08-06 NOTE — ED Triage Notes (Signed)
C/O productive cough and sore throat x 1 week.

## 2017-08-06 NOTE — ED Notes (Signed)
Pt verbalizes d/c understanding and RX. Pt in NAD at time of d/c, VS stable, pt ambulatory.

## 2017-08-06 NOTE — ED Provider Notes (Signed)
[facility] Emergency Department Provider Note  ____________________________________________   First MD Initiated Contact with Patient 08/06/17 1502     (approximate)  I have reviewed the triage vital signs and the nursing notes.  HISTORY  Chief Complaint Sore Throat and Cough  HPI Destiny [name] is a 62 y.o. female previous history of coronary disease and stroke  Patient and her husband are here today, reports that for about 1 week now she has had a dry nonproductive cough.  Some slight clear nasal congestion.  No shortness of breath or chest pain.  Reports that at night she feels like her nose drains into the back of her throat causing her to cough.  Denies fever.  No trouble breathing.  Husband thought it may be an allergy, started taking over-the-counter antihistamine but has not helped much.  No chest pain.  No shortness of breath.  No muscle aches.  No trouble breathing or swallowing.  Past Medical History:  Diagnosis Date  . CAD (coronary artery disease)   . CVA (cerebral infarction)   . Female bladder prolapse   . GERD (gastroesophageal reflux disease)   . Hyperlipidemia   . Hypertension   . Mitral valve disease   . Sleep apnea   . Stroke [facility])    x3    Patient Active Problem List   Diagnosis Date Noted  . Chest pain 02/14/2016  . Small bowel obstruction ([place]) 06/16/2015    Past Surgical History:  Procedure Laterality Date  . CARPAL TUNNEL RELEASE Bilateral   . CHOLECYSTECTOMY    . COLON SURGERY    . TENNIS ELBOW RELEASE/NIRSCHEL PROCEDURE Left   . TONSILLECTOMY      Prior to Admission medications   Medication Sig Start Date End Date Taking? Authorizing Provider  acetaminophen (TYLENOL) 500 MG tablet Take 1,000 mg by mouth every 6 (six) hours as needed for headache.     [provider]  aspirin EC 81 MG tablet Take 81 mg by mouth every morning.     [provider]  benzonatate (TESSALON PERLES) 100 MG capsule  Take 1 capsule (100 mg total) by mouth 3 (three) times daily as needed for cough. 08/06/17 08/06/18  Delman Kitten, MD  cephALEXin (KEFLEX) 500 MG capsule Take 1 capsule (500 mg total) by mouth 3 (three) times daily. 02/11/17   Johnn Hai, PA-C  cetirizine (ZYRTEC) 10 MG tablet Take 1 tablet (10 mg total) by mouth daily. Patient taking differently: Take 10 mg by mouth daily as needed for allergies.  12/22/16   Triplett, Johnette Abraham B, FNP  Cholecalciferol (VITAMIN D3) 5000 UNITS CAPS Take 5,000 Units by mouth daily.    [provider]  Docusate Calcium (STOOL SOFTENER PO) Take 1 capsule by mouth daily.    [provider]  esomeprazole (NEXIUM) 40 MG capsule Take 40 mg by mouth 2 (two) times daily.    [provider]  furosemide (LASIX) 20 MG tablet Take 1 tablet (20 mg total) by mouth every other day. Patient taking differently: Take 20-40 mg by mouth See admin instructions. Take 1-2 tablets (20-40 mg) by mouth once or twice daily (depending on leg, foot and hand swelling -  1 tablet (20 mg) daily, 2 tablets (40 mg) in the am if swelling, sometimes 1 tablet (20 mg) at 3pm if needed for swelling) 02/15/16   Sudini, Alveta Heimlich, MD  gabapentin (NEURONTIN) 600 MG tablet Take 1,800 mg by mouth at bedtime.     [provider]  ibuprofen (ADVIL,MOTRIN) 600 MG tablet Take 1 tablet (600 mg total) by mouth every 8 (eight) hours as needed. Patient taking differently: Take 600 mg by mouth every 8 (eight) hours as needed for headache (pain).  08/01/16   Sable Feil, PA-C  IRON PO Take 1 tablet by mouth daily.    [provider]  metoprolol tartrate (LOPRESSOR) 25 MG tablet Take 0.5 tablets (12.5 mg total) by mouth 2 (two) times daily. 02/15/16   Hillary Bow, MD  nitroGLYCERIN (NITROSTAT) 0.4 MG SL tablet Place 0.4 mg under the tongue every 5 (five) minutes as needed for chest pain.    [provider]  phenazopyridine (PYRIDIUM) 200 MG tablet Take 1 tablet (200 mg  total) by mouth 3 (three) times daily as needed for pain. 02/17/17 02/17/18  Earleen Newport, MD  potassium chloride SA (K-DUR,KLOR-CON) 20 MEQ tablet Take 20 mEq by mouth daily.    [provider]  predniSONE (DELTASONE) 50 MG tablet Take one 50 mg tablet once a day for 5 days. 07/10/17   Lannie Fields, PA-C  rosuvastatin (CRESTOR) 20 MG tablet Take 20 mg by mouth at bedtime.     [provider]  traZODone (DESYREL) 50 MG tablet Take 50 mg by mouth at bedtime.    [provider]  triamcinolone (KENALOG) 0.025 % ointment Apply 1 application topically 2 (two) times daily. Patient not taking: Reported on 02/17/2017 02/01/17   Laban Emperor, PA-C    Allergies Atorvastatin; Bactrim [sulfamethoxazole-trimethoprim]; Diflucan [fluconazole]; Sertraline; and Sulfa antibiotics  Family History  Problem Relation Age of Onset  . Heart disease Other   . Hypertension Other     Social History Social History   Tobacco Use  . Smoking status: Never Smoker  . Smokeless tobacco: Never Used  Substance Use Topics  . Alcohol use: No    Alcohol/week: 0.0 oz  . Drug use: No    Review of Systems Constitutional: No fever/chills Eyes: No visual changes. ENT: No sore throat . Cardiovascular: Denies chest pain. Respiratory: Denies shortness of breath. Gastrointestinal: No abdominal pain.  No nausea, no vomiting.   Genitourinary: Negative for dysuria. Musculoskeletal: Negative for back pain. Skin: Negative for rash. Neurological: Negative for headaches, focal weakness or numbness.    ____________________________________________   PHYSICAL EXAM:  VITAL SIGNS: ED Triage Vitals  Enc Vitals Group     BP 08/06/17 1458 125/76     Pulse Rate 08/06/17 1458 73     Resp 08/06/17 1458 16     Temp 08/06/17 1458 97.7 F (36.5 C)     Temp Source 08/06/17 1458 Oral     SpO2 08/06/17 1458 96 %     Weight 08/06/17 1455 200 lb (90.7 kg)     Height 08/06/17 1455 5\' 4"  (1.626 m)       Head Circumference --      Peak Flow --      Pain Score 08/06/17 1455 6     Pain Loc --      Pain Edu? --      Excl. in [place]? --     Constitutional: Alert and oriented. Well appearing and in no acute distress. Eyes: Conjunctivae are normal. Head: Atraumatic. Nose: No congestion/rhinnorhea. Mouth/Throat: Mucous membranes are moist though the oropharynx is slightly injected, tonsils are surgically absent.  No mass or elevation of floor of mouth.  Speaks in full clear sentences.  No trouble swallowing.. Neck: No stridor.   Cardiovascular: Normal rate, regular  rhythm. Grossly normal heart sounds.  Good peripheral circulation. Respiratory: Normal respiratory effort.  No retractions. Lungs CTAB. Gastrointestinal: Soft and nontender. No distention. Musculoskeletal: No lower extremity tenderness nor edema. Neurologic:  Normal speech and language. No gross focal neurologic deficits are appreciated.  Skin:  Skin is warm, dry and intact. No rash noted. Psychiatric: Mood and affect are normal. Speech and behavior are normal.  ____________________________________________   LABS (all labs ordered are listed, but only abnormal results are displayed)  Labs Reviewed  INFLUENZA PANEL BY PCR (TYPE A & B)   ____________________________________________  EKG   ____________________________________________  RADIOLOGY  Dg Chest 2 View  Result Date: 08/06/2017 CLINICAL DATA:  62 year old female with history of productive cough and sore throat for 1 week. EXAM: CHEST  2 VIEW COMPARISON:  Chest x-ray 02/04/2017. FINDINGS: Mild diffuse peribronchial cuffing, similar to prior study, apparently chronic. Lung volumes are normal. No consolidative airspace disease. No pleural effusions. No pneumothorax. No pulmonary nodule or mass noted. Pulmonary vasculature and the cardiomediastinal silhouette are within normal limits. Aortic atherosclerosis. IMPRESSION: 1. No radiographic evidence of acute  cardiopulmonary disease. 2. Mild chronic peribronchial cuffing suggesting chronic bronchitis. 3. Aortic atherosclerosis. Electronically Signed   By: Vinnie Langton M.D.   On: 08/06/2017 15:44    Chest x-ray negative for acute ____________________________________________   PROCEDURES  Procedure(s) performed: None  Procedures  Critical Care performed: No  ____________________________________________   INITIAL IMPRESSION / ASSESSMENT AND PLAN / ED COURSE  Pertinent labs & imaging results that were available during my care of the patient were reviewed by me and considered in my medical decision making (see chart for details).  Patient presents for evaluation of persistent cough with slight scratchy throat and mild coryza for about a week.  Very heme dynamically stable, nontoxic.  No associated cardiac complaints.  Appears in no distress.  Given influenza season, patient age we will send an influenza test and also obtain a chest x-ray to assure no evidence of pneumonia.  Though, my primary impression is likely mild upper respiratory infection likely viral bronchitis.  Again, no evidence of sepsis, ataxia, difficulty breathing, toxic status, etc.  Chest x-ray no acute disease noted.  Does not appear consistent with strep.  Appears consistent likely with upper respiratory infection and mild viral pharyngitis.  Influenza negative.  Return precautions and treatment recommendations and follow-up discussed with the patient who is agreeable with the plan.       ____________________________________________   FINAL CLINICAL IMPRESSION(S) / ED DIAGNOSES  Final diagnoses:  Viral upper respiratory tract infection      NEW MEDICATIONS STARTED DURING THIS VISIT:  New Prescriptions   BENZONATATE (TESSALON PERLES) 100 MG CAPSULE    Take 1 capsule (100 mg total) by mouth 3 (three) times daily as needed for cough.     Note:  This document was prepared using Dragon voice recognition  software and may include unintentional dictation errors.     Delman Kitten, MD 08/06/17 1715

## 2017-08-06 NOTE — ED Notes (Signed)
Pt unable to sign due to signature pad malfnx  

## 2017-10-11 ENCOUNTER — Encounter: Payer: Self-pay | Admitting: Emergency Medicine

## 2017-10-11 ENCOUNTER — Emergency Department
Admission: EM | Admit: 2017-10-11 | Discharge: 2017-10-11 | Disposition: A | Discharge status: Home / Self Care | Payer: Self-pay | Attending: Emergency Medicine | Admitting: Emergency Medicine

## 2017-10-11 ENCOUNTER — Emergency Department: Payer: Self-pay

## 2017-10-11 DIAGNOSIS — Y999 Unspecified external cause status: Secondary | ICD-10-CM | POA: Insufficient documentation

## 2017-10-11 DIAGNOSIS — Z7982 Long term (current) use of aspirin: Secondary | ICD-10-CM | POA: Insufficient documentation

## 2017-10-11 DIAGNOSIS — Z79899 Other long term (current) drug therapy: Secondary | ICD-10-CM | POA: Insufficient documentation

## 2017-10-11 DIAGNOSIS — W010XXA Fall on same level from slipping, tripping and stumbling without subsequent striking against object, initial encounter: Secondary | ICD-10-CM | POA: Insufficient documentation

## 2017-10-11 DIAGNOSIS — Y9301 Activity, walking, marching and hiking: Secondary | ICD-10-CM | POA: Insufficient documentation

## 2017-10-11 DIAGNOSIS — I10 Essential (primary) hypertension: Secondary | ICD-10-CM | POA: Insufficient documentation

## 2017-10-11 DIAGNOSIS — S5002XA Contusion of left elbow, initial encounter: Secondary | ICD-10-CM | POA: Insufficient documentation

## 2017-10-11 DIAGNOSIS — I2510 Atherosclerotic heart disease of native coronary artery without angina pectoris: Secondary | ICD-10-CM | POA: Insufficient documentation

## 2017-10-11 DIAGNOSIS — Y92009 Unspecified place in unspecified non-institutional (private) residence as the place of occurrence of the external cause: Secondary | ICD-10-CM | POA: Insufficient documentation

## 2017-10-11 MED ORDER — MELOXICAM 15 MG PO TABS: 15.0000 mg | ORAL_TABLET | Freq: Every day | ORAL | 0 refills | Status: DC

## 2017-10-11 NOTE — ED Provider Notes (Signed)
[facility] Emergency Department Provider Note  ____________________________________________  Time seen: Approximately 5:01 PM  I have reviewed the triage vital signs and the nursing notes.   HISTORY  Chief Complaint Arm Pain    HPI Destiny [name] is a 62 y.o. female who presents the emergency department complaining of left elbow pain.  Patient reports that 2 weeks ago, she was in her house and tripped over a bed frame.  Patient landed directly onto her left elbow.  Patient reports that she has had a palpable knot just distal to the olecranon process with increased pain to the region.  Pain has been increasing over the past 2 weeks.  She also reports ecchymosis to the area.  Patient still has good extension flexion of the elbow as well as good supination and pronation of the hand.  Patient denies any radicular symptoms.  Patient did have a previous lateral epicondylitis surgery approximately 20 years ago.  No other surgery or injury to the elbow.  No other complaints.  Patient did not hit her head or lose consciousness during the fall.  Patient is on daily aspirin therapy but no other blood thinners.  Patient has a history of CAD, CVA, GERD, hypertension, sleep apnea.  Patient denies any complaints with chronic medical problems.  Past Medical History:  Diagnosis Date  . CAD (coronary artery disease)   . CVA (cerebral infarction)   . Female bladder prolapse   . GERD (gastroesophageal reflux disease)   . Hyperlipidemia   . Hypertension   . Mitral valve disease   . Sleep apnea   . Stroke [facility])    x3    Patient Active Problem List   Diagnosis Date Noted  . Chest pain 02/14/2016  . Small bowel obstruction ([place]) 06/16/2015    Past Surgical History:  Procedure Laterality Date  . CARPAL TUNNEL RELEASE Bilateral   . CHOLECYSTECTOMY    . COLON SURGERY    . TENNIS ELBOW RELEASE/NIRSCHEL PROCEDURE Left   . TONSILLECTOMY      Prior to Admission medications    Medication Sig Start Date End Date Taking? Authorizing Provider  acetaminophen (TYLENOL) 500 MG tablet Take 1,000 mg by mouth every 6 (six) hours as needed for headache.     [provider]  aspirin EC 81 MG tablet Take 81 mg by mouth every morning.     [provider]  benzonatate (TESSALON PERLES) 100 MG capsule Take 1 capsule (100 mg total) by mouth 3 (three) times daily as needed for cough. 08/06/17 08/06/18  Delman Kitten, MD  cephALEXin (KEFLEX) 500 MG capsule Take 1 capsule (500 mg total) by mouth 3 (three) times daily. 02/11/17   Johnn Hai, PA-C  cetirizine (ZYRTEC) 10 MG tablet Take 1 tablet (10 mg total) by mouth daily. Patient taking differently: Take 10 mg by mouth daily as needed for allergies.  12/22/16   Triplett, Johnette Abraham B, FNP  Cholecalciferol (VITAMIN D3) 5000 UNITS CAPS Take 5,000 Units by mouth daily.    [provider]  Docusate Calcium (STOOL SOFTENER PO) Take 1 capsule by mouth daily.    [provider]  esomeprazole (NEXIUM) 40 MG capsule Take 40 mg by mouth 2 (two) times daily.    [provider]  furosemide (LASIX) 20 MG tablet Take 1 tablet (20 mg total) by mouth every other day. Patient taking differently: Take 20-40 mg by mouth See admin instructions. Take 1-2 tablets (20-40 mg) by mouth once or twice daily (depending  on leg, foot and hand swelling -  1 tablet (20 mg) daily, 2 tablets (40 mg) in the am if swelling, sometimes 1 tablet (20 mg) at 3pm if needed for swelling) 02/15/16   Sudini, Alveta Heimlich, MD  gabapentin (NEURONTIN) 600 MG tablet Take 1,800 mg by mouth at bedtime.     [provider]  ibuprofen (ADVIL,MOTRIN) 600 MG tablet Take 1 tablet (600 mg total) by mouth every 8 (eight) hours as needed. Patient taking differently: Take 600 mg by mouth every 8 (eight) hours as needed for headache (pain).  08/01/16   Sable Feil, PA-C  IRON PO Take 1 tablet by mouth daily.    [provider]  meloxicam (MOBIC)  15 MG tablet Take 1 tablet (15 mg total) by mouth daily. 10/11/17   Cuthriell, Charline Bills, PA-C  metoprolol tartrate (LOPRESSOR) 25 MG tablet Take 0.5 tablets (12.5 mg total) by mouth 2 (two) times daily. 02/15/16   Hillary Bow, MD  nitroGLYCERIN (NITROSTAT) 0.4 MG SL tablet Place 0.4 mg under the tongue every 5 (five) minutes as needed for chest pain.    [provider]  phenazopyridine (PYRIDIUM) 200 MG tablet Take 1 tablet (200 mg total) by mouth 3 (three) times daily as needed for pain. 02/17/17 02/17/18  Earleen Newport, MD  potassium chloride SA (K-DUR,KLOR-CON) 20 MEQ tablet Take 20 mEq by mouth daily.    [provider]  predniSONE (DELTASONE) 50 MG tablet Take one 50 mg tablet once a day for 5 days. 07/10/17   Lannie Fields, PA-C  rosuvastatin (CRESTOR) 20 MG tablet Take 20 mg by mouth at bedtime.     [provider]  traZODone (DESYREL) 50 MG tablet Take 50 mg by mouth at bedtime.    [provider]  triamcinolone (KENALOG) 0.025 % ointment Apply 1 application topically 2 (two) times daily. Patient not taking: Reported on 02/17/2017 02/01/17   Laban Emperor, PA-C    Allergies Atorvastatin; Bactrim [sulfamethoxazole-trimethoprim]; Diflucan [fluconazole]; Sertraline; and Sulfa antibiotics  Family History  Problem Relation Age of Onset  . Heart disease Other   . Hypertension Other     Social History Social History   Tobacco Use  . Smoking status: Never Smoker  . Smokeless tobacco: Never Used  Substance Use Topics  . Alcohol use: No    Alcohol/week: 0.0 oz  . Drug use: No     Review of Systems  Constitutional: No fever/chills Eyes: No visual changes.  Cardiovascular: no chest pain. Respiratory: no cough. No SOB. Gastrointestinal: No abdominal pain.  No nausea, no vomiting.   Musculoskeletal: Positive for left elbow pain status post fall 2 weeks ago Skin: Negative for rash, abrasions, lacerations, ecchymosis. Neurological: Negative  for headaches, focal weakness or numbness. 10-point ROS otherwise negative.  ____________________________________________   PHYSICAL EXAM:  VITAL SIGNS: ED Triage Vitals  Enc Vitals Group     BP 10/11/17 1518 109/68     Pulse Rate 10/11/17 1518 73     Resp 10/11/17 1518 18     Temp 10/11/17 1518 98.5 F (36.9 C)     Temp Source 10/11/17 1518 Oral     SpO2 10/11/17 1518 95 %     Weight 10/11/17 1509 206 lb (93.4 kg)     Height 10/11/17 1509 5\' 2"  (1.575 m)     Head Circumference --      Peak Flow --      Pain Score 10/11/17 1509 0     Pain Loc --  Pain Edu? --      Excl. in [place]? --      Constitutional: Alert and oriented. Well appearing and in no acute distress. Eyes: Conjunctivae are normal. PERRL. EOMI. Head: Atraumatic. Neck: No stridor.    Cardiovascular: Normal rate, regular rhythm. Normal S1 and S2.  Good peripheral circulation. Respiratory: Normal respiratory effort without tachypnea or retractions. Lungs CTAB. Good air entry to the bases with no decreased or absent breath sounds. Musculoskeletal: Full range of motion to all extremities. No gross deformities appreciated.  Visualization of the left elbow reveals mild ecchymosis, that appears to be in the healing stages.  No lacerations or abrasions noted.  Patient has good extension, flexion, supination and pronation of the elbow joint.  On palpation, patient is tender to palpation just dorsal to the olecranon process.  Patient has a palpable lesion in this region.  It is not well circumscribed, it is not mobile.  No other palpable abnormality to the left elbow.  Examination of the shoulder and wrist is unremarkable.  Radial pulse intact distally.  Sensation intact in all dermatomal distributions distally. Neurologic:  Normal speech and language. No gross focal neurologic deficits are appreciated.  Skin:  Skin is warm, dry and intact. No rash noted. Psychiatric: Mood and affect are normal. Speech and behavior are normal.  Patient exhibits appropriate insight and judgement.   ____________________________________________   LABS (all labs ordered are listed, but only abnormal results are displayed)  Labs Reviewed - No data to display ____________________________________________  EKG   ____________________________________________  RADIOLOGY Diamantina Providence Cuthriell, personally viewed and evaluated these images (plain radiographs) as part of my medical decision making, as well as reviewing the written report by the radiologist.  I concur with radiologist finding of no acute osseous abnormality to the left elbow.  Dg Elbow Complete Left  Result Date: 10/11/2017 CLINICAL DATA:  Left arm pain for 2 weeks status post fall. Large bruise. EXAM: LEFT ELBOW - COMPLETE 3+ VIEW COMPARISON:  None. FINDINGS: There is no evidence of fracture, dislocation, or joint effusion. There is no evidence of arthropathy or other focal bone abnormality. Soft tissues are unremarkable. IMPRESSION: Negative. Electronically Signed   By: Franki Cabot M.D.   On: 10/11/2017 17:23    ____________________________________________    PROCEDURES  Procedure(s) performed:    Procedures    Medications - No data to display   ____________________________________________   INITIAL IMPRESSION / ASSESSMENT AND PLAN / ED COURSE  Pertinent labs & imaging results that were available during my care of the patient were reviewed by me and considered in my medical decision making (see chart for details).  Review of the [place] CSRS was performed in accordance of the [place] prior to dispensing any controlled drugs.     Patient's diagnosis is consistent with left elbow contusion.  Patient presented to the emergency department with 2-week history of elbow pain status post fall.  Patient was endorsing a "knot" to the elbow as well.  Initial differential included fracture versus contusion versus dislocation.  Exam was reassuring.  X-ray reveals no  acute osseous abnormality.  Diagnosis consistent with periosteal hematoma.. Patient will be discharged home with prescriptions for meloxicam for symptom control. Patient is to follow up with primary care as needed or otherwise directed. Patient is given ED precautions to return to the ED for any worsening or new symptoms.     ____________________________________________  FINAL CLINICAL IMPRESSION(S) / ED DIAGNOSES  Final diagnoses:  Contusion of left elbow, initial  encounter      NEW MEDICATIONS STARTED DURING THIS VISIT:  ED Discharge Orders        Ordered    meloxicam (MOBIC) 15 MG tablet  Daily     10/11/17 1759          This chart was dictated using voice recognition software/Dragon. Despite best efforts to proofread, errors can occur which can change the meaning. Any change was purely unintentional.    Darletta Moll, PA-C 10/11/17 1800    Eula Listen, MD 10/11/17 [phone]

## 2017-10-11 NOTE — ED Triage Notes (Signed)
Patient presents to the ED with left arm pain x 2 weeks post fall.  Patient is concerned because she has a large bruise to her left arm and a small "bump" to her elbow that she noticed post fall.  Patient denies any pain at this time.  Patient appears to have full range of motion of her elbow.  No obvious distress at this time.

## 2017-11-05 ENCOUNTER — Encounter: Payer: Self-pay | Admitting: Emergency Medicine

## 2017-11-05 ENCOUNTER — Emergency Department: Payer: Self-pay

## 2017-11-05 ENCOUNTER — Observation Stay
Admission: EM | Admit: 2017-11-05 | Discharge: 2017-11-06 | Disposition: A | Discharge status: Home / Self Care | Payer: Self-pay | Attending: Internal Medicine | Admitting: Internal Medicine

## 2017-11-05 ENCOUNTER — Other Ambulatory Visit: Payer: Self-pay

## 2017-11-05 DIAGNOSIS — N179 Acute kidney failure, unspecified: Secondary | ICD-10-CM | POA: Insufficient documentation

## 2017-11-05 DIAGNOSIS — E878 Other disorders of electrolyte and fluid balance, not elsewhere classified: Secondary | ICD-10-CM | POA: Insufficient documentation

## 2017-11-05 DIAGNOSIS — Z8673 Personal history of transient ischemic attack (TIA), and cerebral infarction without residual deficits: Secondary | ICD-10-CM | POA: Insufficient documentation

## 2017-11-05 DIAGNOSIS — G4730 Sleep apnea, unspecified: Secondary | ICD-10-CM | POA: Insufficient documentation

## 2017-11-05 DIAGNOSIS — Z79899 Other long term (current) drug therapy: Secondary | ICD-10-CM | POA: Insufficient documentation

## 2017-11-05 DIAGNOSIS — Z7982 Long term (current) use of aspirin: Secondary | ICD-10-CM | POA: Insufficient documentation

## 2017-11-05 DIAGNOSIS — I2510 Atherosclerotic heart disease of native coronary artery without angina pectoris: Secondary | ICD-10-CM | POA: Insufficient documentation

## 2017-11-05 DIAGNOSIS — M79606 Pain in leg, unspecified: Secondary | ICD-10-CM | POA: Insufficient documentation

## 2017-11-05 DIAGNOSIS — I110 Hypertensive heart disease with heart failure: Secondary | ICD-10-CM | POA: Insufficient documentation

## 2017-11-05 DIAGNOSIS — E876 Hypokalemia: Principal | ICD-10-CM | POA: Diagnosis present

## 2017-11-05 DIAGNOSIS — Z883 Allergy status to other anti-infective agents status: Secondary | ICD-10-CM | POA: Insufficient documentation

## 2017-11-05 DIAGNOSIS — Z882 Allergy status to sulfonamides status: Secondary | ICD-10-CM | POA: Insufficient documentation

## 2017-11-05 DIAGNOSIS — K219 Gastro-esophageal reflux disease without esophagitis: Secondary | ICD-10-CM | POA: Insufficient documentation

## 2017-11-05 DIAGNOSIS — T502X5A Adverse effect of carbonic-anhydrase inhibitors, benzothiadiazides and other diuretics, initial encounter: Secondary | ICD-10-CM | POA: Insufficient documentation

## 2017-11-05 DIAGNOSIS — Z888 Allergy status to other drugs, medicaments and biological substances status: Secondary | ICD-10-CM | POA: Insufficient documentation

## 2017-11-05 DIAGNOSIS — I5032 Chronic diastolic (congestive) heart failure: Secondary | ICD-10-CM | POA: Insufficient documentation

## 2017-11-05 DIAGNOSIS — E785 Hyperlipidemia, unspecified: Secondary | ICD-10-CM | POA: Insufficient documentation

## 2017-11-05 DIAGNOSIS — R252 Cramp and spasm: Secondary | ICD-10-CM | POA: Insufficient documentation

## 2017-11-05 LAB — CBC WITH DIFFERENTIAL/PLATELET
BASOS ABS: 0 10*3/uL (ref 0–0.1)
Basophils Relative: 0 %
EOS ABS: 0.1 10*3/uL (ref 0–0.7)
Eosinophils Relative: 1 %
HCT: 41.3 % (ref 35.0–47.0)
Hemoglobin: 14.2 g/dL (ref 12.0–16.0)
LYMPHS PCT: 13 %
Lymphs Abs: 1.4 10*3/uL (ref 1.0–3.6)
MCH: 31.1 pg (ref 26.0–34.0)
MCHC: 34.5 g/dL (ref 32.0–36.0)
MCV: 90.1 fL (ref 80.0–100.0)
Monocytes Absolute: 1.1 10*3/uL — ABNORMAL HIGH (ref 0.2–0.9)
Monocytes Relative: 10 %
Neutro Abs: 8.3 10*3/uL — ABNORMAL HIGH (ref 1.4–6.5)
Neutrophils Relative %: 76 %
PLATELETS: 258 10*3/uL (ref 150–440)
RBC: 4.58 MIL/uL (ref 3.80–5.20)
RDW: 14.1 % (ref 11.5–14.5)
WBC: 11 10*3/uL (ref 3.6–11.0)

## 2017-11-05 LAB — COMPREHENSIVE METABOLIC PANEL
ALBUMIN: 4.5 g/dL (ref 3.5–5.0)
ALT: 36 U/L (ref 14–54)
AST: 44 U/L — AB (ref 15–41)
Alkaline Phosphatase: 99 U/L (ref 38–126)
Anion gap: 14 (ref 5–15)
BUN: 45 mg/dL — AB (ref 6–20)
CHLORIDE: 91 mmol/L — AB (ref 101–111)
CO2: 31 mmol/L (ref 22–32)
CREATININE: 1.71 mg/dL — AB (ref 0.44–1.00)
Calcium: 9.6 mg/dL (ref 8.9–10.3)
GFR calc Af Amer: 36 mL/min — ABNORMAL LOW (ref >60)
GFR calc non Af Amer: 31 mL/min — ABNORMAL LOW (ref >60)
GLUCOSE: 132 mg/dL — AB (ref 65–99)
Potassium: 2.5 mmol/L — CL (ref 3.5–5.1)
SODIUM: 136 mmol/L (ref 135–145)
Total Bilirubin: 0.9 mg/dL (ref 0.3–1.2)
Total Protein: 8.7 g/dL — ABNORMAL HIGH (ref 6.5–8.1)

## 2017-11-05 LAB — POTASSIUM: Potassium: 3 mmol/L — ABNORMAL LOW (ref 3.5–5.1)

## 2017-11-05 LAB — MAGNESIUM
Magnesium: 2.3 mg/dL (ref 1.7–2.4)
Magnesium: 2.6 mg/dL — ABNORMAL HIGH (ref 1.7–2.4)

## 2017-11-05 MED ORDER — ACETAMINOPHEN 325 MG PO TABS: 650.0000 mg | ORAL_TABLET | Freq: Four times a day (QID) | ORAL | Status: DC | PRN

## 2017-11-05 MED ORDER — TRAZODONE HCL 50 MG PO TABS
50.0000 mg | ORAL_TABLET | Freq: Every day | ORAL | Status: DC
  Administered 2017-11-05: 50 mg via ORAL
  Filled 2017-11-05: qty 1

## 2017-11-05 MED ORDER — POTASSIUM CHLORIDE 10 MEQ/100ML IV SOLN
10.0000 meq | Freq: Once | INTRAVENOUS | Status: AC
  Administered 2017-11-05: 10 meq via INTRAVENOUS
  Filled 2017-11-05: qty 100

## 2017-11-05 MED ORDER — TRIAMCINOLONE ACETONIDE 0.025 % EX CREA
1.0000 "application " | TOPICAL_CREAM | CUTANEOUS | Status: DC
  Administered 2017-11-06: 1 via TOPICAL
  Filled 2017-11-05: qty 15

## 2017-11-05 MED ORDER — ACETAMINOPHEN 650 MG RE SUPP: 650.0000 mg | Freq: Four times a day (QID) | RECTAL | Status: DC | PRN

## 2017-11-05 MED ORDER — ASPIRIN EC 81 MG PO TBEC
81.0000 mg | DELAYED_RELEASE_TABLET | ORAL | Status: DC
  Administered 2017-11-06: 81 mg via ORAL
  Filled 2017-11-05: qty 1

## 2017-11-05 MED ORDER — POLYETHYLENE GLYCOL 3350 17 G PO PACK: 17.0000 g | PACK | Freq: Every day | ORAL | Status: DC | PRN

## 2017-11-05 MED ORDER — NITROGLYCERIN 0.4 MG SL SUBL: 0.4000 mg | SUBLINGUAL_TABLET | SUBLINGUAL | Status: DC | PRN

## 2017-11-05 MED ORDER — DOCUSATE SODIUM 100 MG PO CAPS
100.0000 mg | ORAL_CAPSULE | Freq: Every day | ORAL | Status: DC
Start: 2017-11-05 — End: 2017-11-06
  Administered 2017-11-06: 100 mg via ORAL
  Filled 2017-11-05: qty 1

## 2017-11-05 MED ORDER — POTASSIUM CHLORIDE 20 MEQ PO PACK
60.0000 meq | PACK | Freq: Once | ORAL | Status: AC
Start: 2017-11-05 — End: 2017-11-05
  Administered 2017-11-05: 60 meq via ORAL
  Filled 2017-11-05: qty 3

## 2017-11-05 MED ORDER — HYDROCODONE-ACETAMINOPHEN 5-325 MG PO TABS: 1.0000 | ORAL_TABLET | ORAL | Status: DC | PRN

## 2017-11-05 MED ORDER — PAROXETINE HCL 30 MG PO TABS
30.0000 mg | ORAL_TABLET | Freq: Every day | ORAL | Status: DC
  Administered 2017-11-06: 30 mg via ORAL
  Filled 2017-11-05: qty 1

## 2017-11-05 MED ORDER — TICAGRELOR 90 MG PO TABS
90.0000 mg | ORAL_TABLET | Freq: Two times a day (BID) | ORAL | Status: DC
  Administered 2017-11-05 – 2017-11-06 (×2): 90 mg via ORAL
  Filled 2017-11-05 (×2): qty 1

## 2017-11-05 MED ORDER — PNEUMOCOCCAL VAC POLYVALENT 25 MCG/0.5ML IJ INJ: 0.5000 mL | INJECTION | INTRAMUSCULAR | Status: DC

## 2017-11-05 MED ORDER — SODIUM CHLORIDE 0.45 % IV SOLN
INTRAVENOUS | Status: DC
  Administered 2017-11-05: 18:00:00 via INTRAVENOUS
  Filled 2017-11-05: qty 1000

## 2017-11-05 MED ORDER — CYCLOBENZAPRINE HCL 10 MG PO TABS: 5.0000 mg | ORAL_TABLET | Freq: Three times a day (TID) | ORAL | Status: DC | PRN

## 2017-11-05 MED ORDER — ROSUVASTATIN CALCIUM 10 MG PO TABS
20.0000 mg | ORAL_TABLET | Freq: Every day | ORAL | Status: DC
  Administered 2017-11-05: 20 mg via ORAL
  Filled 2017-11-05: qty 2

## 2017-11-05 MED ORDER — HEPARIN SODIUM (PORCINE) 5000 UNIT/ML IJ SOLN
5000.0000 [IU] | Freq: Three times a day (TID) | INTRAMUSCULAR | Status: DC
  Administered 2017-11-05 – 2017-11-06 (×3): 5000 [IU] via SUBCUTANEOUS
  Filled 2017-11-05 (×3): qty 1

## 2017-11-05 MED ORDER — SPIRONOLACTONE 25 MG PO TABS
50.0000 mg | ORAL_TABLET | Freq: Every day | ORAL | Status: DC
  Administered 2017-11-06: 50 mg via ORAL
  Filled 2017-11-05: qty 2

## 2017-11-05 MED ORDER — LORATADINE 10 MG PO TABS
10.0000 mg | ORAL_TABLET | Freq: Every day | ORAL | Status: DC
  Administered 2017-11-06: 10 mg via ORAL
  Filled 2017-11-05: qty 1

## 2017-11-05 MED ORDER — BENZONATATE 100 MG PO CAPS: 100.0000 mg | ORAL_CAPSULE | Freq: Three times a day (TID) | ORAL | Status: DC | PRN

## 2017-11-05 MED ORDER — POTASSIUM CHLORIDE 20 MEQ PO PACK: 60.0000 meq | PACK | Freq: Once | ORAL | Status: DC

## 2017-11-05 MED ORDER — PANTOPRAZOLE SODIUM 40 MG PO TBEC
40.0000 mg | DELAYED_RELEASE_TABLET | Freq: Every day | ORAL | Status: DC
  Administered 2017-11-05 – 2017-11-06 (×2): 40 mg via ORAL
  Filled 2017-11-05 (×2): qty 1

## 2017-11-05 MED ORDER — METOPROLOL TARTRATE 25 MG PO TABS
12.5000 mg | ORAL_TABLET | Freq: Two times a day (BID) | ORAL | Status: DC
  Administered 2017-11-05 – 2017-11-06 (×2): 12.5 mg via ORAL
  Filled 2017-11-05 (×2): qty 1

## 2017-11-05 MED ORDER — VITAMIN D 1000 UNITS PO TABS
5000.0000 [IU] | ORAL_TABLET | Freq: Every day | ORAL | Status: DC
  Administered 2017-11-06: 5000 [IU] via ORAL
  Filled 2017-11-05 (×3): qty 5

## 2017-11-05 MED ORDER — ONDANSETRON HCL 4 MG/2ML IJ SOLN: 4.0000 mg | Freq: Four times a day (QID) | INTRAMUSCULAR | Status: DC | PRN

## 2017-11-05 MED ORDER — ONDANSETRON HCL 4 MG PO TABS: 4.0000 mg | ORAL_TABLET | Freq: Four times a day (QID) | ORAL | Status: DC | PRN

## 2017-11-05 MED ORDER — POTASSIUM CHLORIDE 2 MEQ/ML IV SOLN: INTRAVENOUS | Status: DC

## 2017-11-05 NOTE — ED Notes (Signed)
Dr. Salary at bedside.  

## 2017-11-05 NOTE — Plan of Care (Signed)
Patient transferred from ED via stretcher admitted for hypokalemia. Oriented to room, call bell, and staff. Bed in lowest position. Fall safety plan reviewed. Full assessment to Epic. Skin assessment verified with Estill Dooms. Telemetry box verification with tele clerk- Box#:[id number]. Will continue to monitor.   Problem: Education: Goal: Knowledge of General Education information will improve Outcome: Progressing   Problem: Health Behavior/Discharge Planning: Goal: Ability to manage health-related needs will improve Outcome: Progressing   Problem: Clinical Measurements: Goal: Will remain free from infection Outcome: Progressing

## 2017-11-05 NOTE — ED Provider Notes (Signed)
[facility] Emergency Department Provider Note ____________________________________________   First MD Initiated Contact with Patient 11/05/17 1349     (approximate)  I have reviewed the triage vital signs and the nursing notes.   HISTORY  Chief Complaint Leg Pain    HPI Destiny [name] is a 62 y.o. female with history of CHF and other PMH as noted below who presents with bilateral leg and arm cramping, gradual onset over the last several days, and associated with low potassium.  Patient states that she had developed low potassium several weeks ago, and was taken off of Lasix.  She was placed on torsemide, and given oral potassium for the last 3 days to take at home.  Patient has been taking 80 M EQ twice daily for the last 3 days, with last dose this morning.  Patient states that her cramping has persisted despite this medication and she is concerned that her potassium is still low.  She also reports slightly increased shortness of breath compared to baseline.   Past Medical History:  Diagnosis Date  . CAD (coronary artery disease)   . CVA (cerebral infarction)   . Female bladder prolapse   . GERD (gastroesophageal reflux disease)   . Hyperlipidemia   . Hypertension   . Mitral valve disease   . Sleep apnea   . Stroke [facility])    x3    Patient Active Problem List   Diagnosis Date Noted  . Chest pain 02/14/2016  . Small bowel obstruction ([place]) 06/16/2015    Past Surgical History:  Procedure Laterality Date  . CARPAL TUNNEL RELEASE Bilateral   . CHOLECYSTECTOMY    . COLON SURGERY    . TENNIS ELBOW RELEASE/NIRSCHEL PROCEDURE Left   . TONSILLECTOMY      Prior to Admission medications   Medication Sig Start Date End Date Taking? Authorizing Provider  acetaminophen (TYLENOL) 500 MG tablet Take 1,000 mg by mouth every 6 (six) hours as needed for headache.    Yes [provider]  aspirin EC 81 MG tablet Take 81 mg by mouth every morning.     Yes [provider]  Cholecalciferol (VITAMIN D3) 5000 UNITS CAPS Take 5,000 Units by mouth daily.   Yes [provider]  Docusate Calcium (STOOL SOFTENER PO) Take 1 capsule by mouth daily.   Yes [provider]  esomeprazole (NEXIUM) 40 MG capsule Take 40 mg by mouth 2 (two) times daily.   Yes [provider]  metoprolol tartrate (LOPRESSOR) 25 MG tablet Take 0.5 tablets (12.5 mg total) by mouth 2 (two) times daily. 02/15/16  Yes Sudini, Alveta Heimlich, MD  nitroGLYCERIN (NITROSTAT) 0.4 MG SL tablet Place 0.4 mg under the tongue every 5 (five) minutes as needed for chest pain.   Yes [provider]  potassium chloride SA (K-DUR,KLOR-CON) 20 MEQ tablet Take 20 mEq by mouth 2 (two) times daily.    Yes [provider]  rosuvastatin (CRESTOR) 20 MG tablet Take 20 mg by mouth at bedtime.    Yes [provider]  traZODone (DESYREL) 50 MG tablet Take 50 mg by mouth at bedtime.   Yes [provider]  benzonatate (TESSALON PERLES) 100 MG capsule Take 1 capsule (100 mg total) by mouth 3 (three) times daily as needed for cough. 08/06/17 08/06/18  Delman Kitten, MD  cephALEXin (KEFLEX) 500 MG capsule Take 1 capsule (500 mg total) by mouth 3 (three) times daily. 02/11/17   Johnn Hai, PA-C  cetirizine (ZYRTEC) 10  MG tablet Take 1 tablet (10 mg total) by mouth daily. Patient taking differently: Take 10 mg by mouth daily as needed for allergies.  12/22/16   Triplett, Johnette Abraham B, FNP  furosemide (LASIX) 20 MG tablet Take 1 tablet (20 mg total) by mouth every other day. Patient taking differently: Take 20-40 mg by mouth See admin instructions. Take 1-2 tablets (20-40 mg) by mouth once or twice daily (depending on leg, foot and hand swelling -  1 tablet (20 mg) daily, 2 tablets (40 mg) in the am if swelling, sometimes 1 tablet (20 mg) at 3pm if needed for swelling) 02/15/16   Sudini, Alveta Heimlich, MD  ibuprofen (ADVIL,MOTRIN) 600 MG tablet Take 1 tablet (600 mg  total) by mouth every 8 (eight) hours as needed. Patient taking differently: Take 600 mg by mouth every 8 (eight) hours as needed for headache (pain).  08/01/16   Sable Feil, PA-C  IRON PO Take 1 tablet by mouth daily.    [provider]  meloxicam (MOBIC) 15 MG tablet Take 1 tablet (15 mg total) by mouth daily. 10/11/17   Cuthriell, Charline Bills, PA-C  phenazopyridine (PYRIDIUM) 200 MG tablet Take 1 tablet (200 mg total) by mouth 3 (three) times daily as needed for pain. 02/17/17 02/17/18  Earleen Newport, MD  predniSONE (DELTASONE) 50 MG tablet Take one 50 mg tablet once a day for 5 days. Patient not taking: Reported on 11/05/2017 07/10/17   Lannie Fields, PA-C  triamcinolone (KENALOG) 0.025 % ointment Apply 1 application topically 2 (two) times daily. Patient not taking: Reported on 02/17/2017 02/01/17   Laban Emperor, PA-C    Allergies Atorvastatin; Fluconazole; Sertraline; Sulfa antibiotics; Sulfamethoxazole-trimethoprim; and Sulfasalazine  Family History  Problem Relation Age of Onset  . Heart disease Other   . Hypertension Other     Social History Social History   Tobacco Use  . Smoking status: Never Smoker  . Smokeless tobacco: Never Used  Substance Use Topics  . Alcohol use: No    Alcohol/week: 0.0 oz  . Drug use: No    Review of Systems  Constitutional: No fever. Eyes: No redness. ENT: No sore throat. Cardiovascular: Denies chest pain. Respiratory: Positive for shortness of breath. Gastrointestinal: No vomiting.  Genitourinary: Negative for dysuria or frequency.  Musculoskeletal: Negative for back pain.  Positive for cramping. Skin: Negative for rash. Neurological: Negative for headaches, focal weakness or numbness.   ____________________________________________   PHYSICAL EXAM:  VITAL SIGNS: ED Triage Vitals  Enc Vitals Group     BP 11/05/17 1242 91/74     Pulse Rate 11/05/17 1242 88     Resp 11/05/17 1242 18     Temp 11/05/17 1242 97.6 F  (36.4 C)     Temp Source 11/05/17 1242 Oral     SpO2 11/05/17 1242 98 %     Weight 11/05/17 1243 211 lb (95.7 kg)     Height 11/05/17 1243 5\' 2"  (1.575 m)     Head Circumference --      Peak Flow --      Pain Score 11/05/17 1243 0     Pain Loc --      Pain Edu? --      Excl. in [place]? --     Constitutional: Alert and oriented. Well appearing and in no acute distress. Eyes: Conjunctivae are normal.  Head: Atraumatic. Nose: No congestion/rhinnorhea. Mouth/Throat: Mucous membranes are moist.   Neck: Normal range of motion.  Cardiovascular: Normal rate, regular rhythm. Grossly  normal heart sounds.  Good peripheral circulation. Respiratory: Normal respiratory effort.  No retractions. Lungs CTAB. Gastrointestinal: No distention.  Musculoskeletal: Trace bilateral lower extremity edema.  Extremities warm and well perfused.  Full range of motion all large joints. Neurologic:  Normal speech and language.  Motor and sensory intact in all extremities.  No gross focal neurologic deficits are appreciated.  Skin:  Skin is warm and dry. No rash noted. Psychiatric: Mood and affect are normal. Speech and behavior are normal.  ____________________________________________   LABS (all labs ordered are listed, but only abnormal results are displayed)  Labs Reviewed  CBC WITH DIFFERENTIAL/PLATELET - Abnormal; Notable for the following components:      Result Value   Neutro Abs 8.3 (*)    Monocytes Absolute 1.1 (*)    All other components within normal limits  COMPREHENSIVE METABOLIC PANEL - Abnormal; Notable for the following components:   Potassium 2.5 (*)    Chloride 91 (*)    Glucose, Bld 132 (*)    BUN 45 (*)    Creatinine, Ser 1.71 (*)    Total Protein 8.7 (*)    AST 44 (*)    GFR calc non Af Amer 31 (*)    GFR calc Af Amer 36 (*)    All other components within normal limits  MAGNESIUM   ____________________________________________  EKG  ED ECG REPORT I, Arta Silence, the  attending physician, personally viewed and interpreted this ECG.  Date: 11/05/2017 EKG Time: 1436 Rate: 81 Rhythm: normal sinus rhythm with PACs QRS Axis: normal Intervals: normal ST/T Wave abnormalities: Inferior waves; no acute ST/T changes Narrative Interpretation: no evidence of acute ischemia; no significant change when compared to EKG of 02/02/2017  ____________________________________________  RADIOLOGY  CXR: No focal infiltrate or significant edema  ____________________________________________   PROCEDURES  Procedure(s) performed: No  Procedures  Critical Care performed: No ____________________________________________   INITIAL IMPRESSION / ASSESSMENT AND PLAN / ED COURSE  Pertinent labs & imaging results that were available during my care of the patient were reviewed by me and considered in my medical decision making (see chart for details).  62 year old female with PMH as noted above and with CHF, formally on Lasix and now on torsemide and Aldactone presents with worsening bilateral leg and arm cramping, and concern for hypokalemia.  Patient has been on p.o. potassium repletion for the last several days at home.  Past medical records reviewed in epic and are noncontributory.  On exam, the patient is relatively well-appearing, vital signs are normal except for borderline low BP, the lungs are clear, and the remainder the exam is unremarkable.  Neuro intact in all extremities.  Initial labs obtained from triage confirm potassium of 2.5 which is a significant change from patient's baseline.  Given that the patient is already been on oral repletion at home without change, she will require admission for IV repletion.  I will obtain EKG and chest x-ray as well, and then proceed with admission.    ----------------------------------------- 2:29 PM on 11/05/2017 -----------------------------------------  I signed the patient out to the hospitalist Dr. salary for  admission.  ____________________________________________   FINAL CLINICAL IMPRESSION(S) / ED DIAGNOSES  Final diagnoses:  Hypokalemia      NEW MEDICATIONS STARTED DURING THIS VISIT:  New Prescriptions   No medications on file     Note:  This document was prepared using Dragon voice recognition software and may include unintentional dictation errors.    Arta Silence, MD 11/05/17 1440

## 2017-11-05 NOTE — ED Notes (Signed)
Pt taken to xray 

## 2017-11-05 NOTE — H&P (Signed)
[place] [facility] [place] NAME: Destiny [name]    MR#:  [id number]  DATE OF BIRTH:  [birth year]  DATE OF ADMISSION:  11/05/2017  PRIMARY CARE PHYSICIAN: [name], MD   REQUESTING/REFERRING PHYSICIAN:   CHIEF COMPLAINT:   Chief Complaint  Patient presents with  . Leg Pain    HISTORY OF PRESENT ILLNESS: Destiny [name]  is a 62 y.o. female with a known history per below presents emergency room with leg cramps for the last several days typically caused by low potassium, patient previously changed from Lasix to torsemide/spironolactone to help remedy the situation, in the emergency room patient was found to have potassium 2.5, creatinine 1.7 with baseline normal, chloride 91, patient evaluated at the bedside, in no apparent distress, husband is present, patient without any other complaints, patient is now being admitted for acute diffuse muscular spasms secondary to acute recurrent hypokalemia secondary to diuretic side effect, acute kidney injury secondary to diuretic side effect.  PAST MEDICAL HISTORY:   Past Medical History:  Diagnosis Date  . CAD (coronary artery disease)   . CVA (cerebral infarction)   . Female bladder prolapse   . GERD (gastroesophageal reflux disease)   . Hyperlipidemia   . Hypertension   . Mitral valve disease   . Sleep apnea   . Stroke [facility])    x3    PAST SURGICAL HISTORY:  Past Surgical History:  Procedure Laterality Date  . CARPAL TUNNEL RELEASE Bilateral   . CHOLECYSTECTOMY    . COLON SURGERY    . TENNIS ELBOW RELEASE/NIRSCHEL PROCEDURE Left   . TONSILLECTOMY      SOCIAL HISTORY:  Social History   Tobacco Use  . Smoking status: Never Smoker  . Smokeless tobacco: Never Used  Substance Use Topics  . Alcohol use: No    Alcohol/week: 0.0 oz    FAMILY HISTORY:  Family History  Problem Relation Age of Onset  . Heart disease Other   . Hypertension Other     DRUG ALLERGIES:  Allergies  Allergen  Reactions  . Atorvastatin Hives    No s/sx of anaphylaxis, just intermittent hives.  Does not seem to be a class effect as she has tolerated lovastatin previously No s/sx of anaphylaxis, just intermittent hives.  Does not seem to be a class effect as she has tolerated lovastatin previously  . Fluconazole Rash    Reaction to generic but not name brand Reaction to generic but not name brand CAN USE DIFLUCAN.DOES NOT BREAK OUT WITH DIFLUCAN.(allergic only to generic brand)  . Sertraline Itching  . Sulfa Antibiotics Rash and Other (See Comments)    "hot " sensation  . Sulfamethoxazole-Trimethoprim Rash and Other (See Comments)    "felt hot" Other reaction(s): Other (See Comments) "felt hot"  . Sulfasalazine Other (See Comments) and Rash    "hot " sensation    REVIEW OF SYSTEMS:   CONSTITUTIONAL: No fever, fatigue or weakness.  EYES: No blurred or double vision.  EARS, NOSE, AND THROAT: No tinnitus or ear pain.  RESPIRATORY: No cough, shortness of breath, wheezing or hemoptysis.  CARDIOVASCULAR: No chest pain, orthopnea, edema.  GASTROINTESTINAL: No nausea, vomiting, diarrhea or abdominal pain.  GENITOURINARY: No dysuria, hematuria.  ENDOCRINE: No polyuria, nocturia,  HEMATOLOGY: No anemia, easy bruising or bleeding SKIN: No rash or lesion. MUSCULOSKELETAL: No joint pain or arthritis.  + Muscular spasms NEUROLOGIC: No tingling, numbness, weakness.  PSYCHIATRY: No anxiety or depression.   MEDICATIONS AT HOME:  Prior to Admission medications   Medication Sig Start Date End Date Taking? Authorizing Provider  acetaminophen (TYLENOL) 500 MG tablet Take 1,000 mg by mouth every 6 (six) hours as needed for headache.    Yes [provider]  aspirin EC 81 MG tablet Take 81 mg by mouth every morning.    Yes [provider]  benzonatate (TESSALON PERLES) 100 MG capsule Take 1 capsule (100 mg total) by mouth 3 (three) times daily as needed for cough. 08/06/17 08/06/18 Yes  Delman Kitten, MD  cetirizine (ZYRTEC) 10 MG tablet Take 1 tablet (10 mg total) by mouth daily. Patient taking differently: Take 10 mg by mouth daily as needed for allergies.  12/22/16  Yes Triplett, Cari B, FNP  Cholecalciferol (VITAMIN D3) 5000 UNITS CAPS Take 5,000 Units by mouth daily.   Yes [provider]  Docusate Calcium (STOOL SOFTENER PO) Take 1 capsule by mouth daily.   Yes [provider]  esomeprazole (NEXIUM) 40 MG capsule Take 40 mg by mouth 2 (two) times daily.   Yes [provider]  metolazone (ZAROXOLYN) 2.5 MG tablet Take 2.5 mg by mouth daily as needed for fluid. For up to 3 days. 11/02/17 12/02/17 Yes [provider]  metoprolol tartrate (LOPRESSOR) 25 MG tablet Take 0.5 tablets (12.5 mg total) by mouth 2 (two) times daily. 02/15/16  Yes Sudini, Alveta Heimlich, MD  nitroGLYCERIN (NITROSTAT) 0.4 MG SL tablet Place 0.4 mg under the tongue every 5 (five) minutes as needed for chest pain.   Yes [provider]  PARoxetine (PAXIL) 30 MG tablet Take 30 mg by mouth daily.   Yes [provider]  potassium chloride SA (K-DUR,KLOR-CON) 20 MEQ tablet Take 20 mEq by mouth 2 (two) times daily.    Yes [provider]  rosuvastatin (CRESTOR) 20 MG tablet Take 20 mg by mouth at bedtime.    Yes [provider]  spironolactone (ALDACTONE) 25 MG tablet Take 25 mg by mouth daily. 11/02/17 11/02/18 Yes [provider]  ticagrelor (BRILINTA) 90 MG TABS tablet Take 90 mg by mouth 2 (two) times daily. 09/14/17  Yes [provider]  torsemide (DEMADEX) 20 MG tablet Take 80 mg by mouth 2 (two) times daily. 11/02/17 11/02/18 Yes [provider]  traZODone (DESYREL) 50 MG tablet Take 50 mg by mouth at bedtime.   Yes [provider]  triamcinolone (KENALOG) 0.025 % ointment Apply 1 application topically 2 (two) times daily. Patient taking differently: Apply 1 application topically every Monday, Wednesday, and Friday.   02/01/17  Yes Laban Emperor, PA-C      PHYSICAL EXAMINATION:   VITAL SIGNS: Blood pressure (!) 130/95, pulse 81, temperature 97.6 F (36.4 C), temperature source Oral, resp. rate (!) 22, height 5\' 2"  (1.575 m), weight 95.7 kg (211 lb), SpO2 97 %.  GENERAL:  62 y.o.-year-old patient lying in the bed with no acute distress.  EYES: Pupils equal, round, reactive to light and accommodation. No scleral icterus. Extraocular muscles intact.  HEENT: Head atraumatic, normocephalic. Oropharynx and nasopharynx clear.  NECK:  Supple, no jugular venous distention. No thyroid enlargement, no tenderness.  LUNGS: Normal breath sounds bilaterally, no wheezing, rales,rhonchi or crepitation. No use of accessory muscles of respiration.  CARDIOVASCULAR: S1, S2 normal. No murmurs, rubs, or gallops.  ABDOMEN: Soft, nontender, nondistended. Bowel sounds present. No organomegaly or mass.  EXTREMITIES: No pedal edema, cyanosis, or clubbing.  NEUROLOGIC: Cranial nerves II through XII are intact. MAES. Gait not checked.  PSYCHIATRIC: The patient  is alert and oriented x 3.  SKIN: No obvious rash, lesion, or ulcer.   LABORATORY PANEL:   CBC Recent Labs  Lab 11/05/17 1245  WBC 11.0  HGB 14.2  HCT 41.3  PLT 258  MCV 90.1  MCH 31.1  MCHC 34.5  RDW 14.1  LYMPHSABS 1.4  MONOABS 1.1*  EOSABS 0.1  BASOSABS 0.0   ------------------------------------------------------------------------------------------------------------------  Chemistries  Recent Labs  Lab 11/05/17 1245  NA 136  K 2.5*  CL 91*  CO2 31  GLUCOSE 132*  BUN 45*  CREATININE 1.71*  CALCIUM 9.6  MG 2.3  AST 44*  ALT 36  ALKPHOS 99  BILITOT 0.9   ------------------------------------------------------------------------------------------------------------------ estimated creatinine clearance is 36.8 mL/min (A) (by C-G formula based on SCr of 1.71 mg/dL  (H)). ------------------------------------------------------------------------------------------------------------------ No results for input(s): TSH, T4TOTAL, T3FREE, THYROIDAB in the last 72 hours.  Invalid input(s): FREET3   Coagulation profile No results for input(s): INR, PROTIME in the last 168 hours. ------------------------------------------------------------------------------------------------------------------- No results for input(s): DDIMER in the last 72 hours. -------------------------------------------------------------------------------------------------------------------  Cardiac Enzymes No results for input(s): CKMB, TROPONINI, MYOGLOBIN in the last 168 hours.  Invalid input(s): CK ------------------------------------------------------------------------------------------------------------------ Invalid input(s): POCBNP  ---------------------------------------------------------------------------------------------------------------  Urinalysis    Component Value Date/Time   COLORURINE STRAW (A) 02/17/2017 0843   APPEARANCEUR CLEAR (A) 02/17/2017 0843   APPEARANCEUR Cloudy 06/07/2014 0826   LABSPEC 1.004 (L) 02/17/2017 0843   LABSPEC 1.017 06/07/2014 0826   PHURINE 6.0 02/17/2017 0843   GLUCOSEU NEGATIVE 02/17/2017 0843   GLUCOSEU Negative 06/07/2014 0826   HGBUR SMALL (A) 02/17/2017 0843   BILIRUBINUR NEGATIVE 02/17/2017 0843   BILIRUBINUR Negative 06/07/2014 0826   KETONESUR NEGATIVE 02/17/2017 0843   PROTEINUR NEGATIVE 02/17/2017 0843   NITRITE NEGATIVE 02/17/2017 0843   LEUKOCYTESUR SMALL (A) 02/17/2017 0843   LEUKOCYTESUR 3+ 06/07/2014 0826     RADIOLOGY: Dg Chest 2 View  Result Date: 11/05/2017 CLINICAL DATA:  Patient reports SOB and low K+ levels over past week. Denies CP, cough or fever. Hx stroke, mitral valve prolapse, HTN, GERD, CVA. Non-smoker. EXAM: CHEST - 2 VIEW COMPARISON:  08/06/2017 FINDINGS: The heart size and mediastinal contours are  within normal limits. Both lungs are clear. Degenerative changes are seen in thoracic spine. IMPRESSION: No active cardiopulmonary disease. Electronically Signed   By: Nolon Nations M.D.   On: 11/05/2017 14:37    EKG: Orders placed or performed during the hospital encounter of 11/05/17  . ED EKG  . ED EKG  . EKG 12-Lead  . EKG 12-Lead    IMPRESSION AND PLAN: 1 acute muscular spasm secondary to acute recurrent severe hypokalemia Admit to telemetry floor on the observation unit, replete potassium, check magnesium level, Flexeril as needed, BMP in the morning, IV fluids for gentle rehydration given history of diastolic congestive heart failure  2 acute kidney injury Baseline creatinine is normal Secondary to diuretic side effect Avoid nephrotoxic agents, strict I&O monitoring, daily weights, BMP in the morning  3 acute hypochloremia Most likely secondary to diuretic side effect/acute dehydration IV fluids for rehydration and check BMP in morning   4 chronic diastolic congestive heart failure without exacerbation Last echocardiogram with normal/preserved left ventricular systolic function/ejection fraction Given recurrence of hypokalemia with diuretic therapy-would suggest using Aldactone only Continue aspirin, increase Aldactone to 50 mg daily, hold prn Zaroxolyn, stop torsemide, strict I&O monitoring, daily weights, low-sodium diet, and continue close medical monitoring  5 history of coronary artery disease Stable Continue dual antiplatelet therapy with aspirin, Brilinta, metoprolol,  Crestor  6 history of cerebrovascular accident x3 Stable continue antiplatelet therapy as stated above w/ Crestor  7 chronic GERD without esophagitis PPI daily  Disposition home tomorrow barring any complications   All the records are reviewed and case discussed with ED provider. Management plans discussed with the patient, family and they are in agreement.  CODE STATUS: Code Status History     Date Active Date Inactive Code Status Order ID Comments User Context   02/05/2017 0035 02/05/2017 2254 Full Code [id number]  Valinda Party, DO Inpatient   02/15/2016 2103 02/15/2016 2136 Full Code [id number]  Hillary Bow, MD Inpatient   02/14/2016 1728 02/15/2016 0747 Full Code [id number]  Dustin Flock, MD ED   06/16/2015 1753 06/19/2015 1822 Full Code [id number]  Clayburn Pert, MD Inpatient       TOTAL TIME TAKING CARE OF THIS PATIENT: 45 minutes.    Avel Peace Cheron Coryell M.D on 11/05/2017   Between 7am to 6pm - Pager - [phone]  After 6pm go to [email or web address] - password [email or web address] [facility]  Office  [phone]  CC: Primary care physician; [name], MD   Note: This dictation was prepared with Dragon dictation along with smaller phrase technology. Any transcriptional errors that result from this process are unintentional.

## 2017-11-05 NOTE — Progress Notes (Signed)
Pt's husband states pt wears CPAP at bedtime at home, no order for CPAP here. MD paged, Dr. Jerelyn Charles to place order for CPAP at bedtime here.  Conley Simmonds, RN, BSN

## 2017-11-05 NOTE — ED Triage Notes (Signed)
Pt states was seen by PCP last week and was told that her K+ was low, pt was taken off of her Lasix and told to increase her Potassium to 1mEq a day to increase her K+. Pt states yesterday began having leg cramps. Pt states instead of taking lasix pt was changed to Torsemide and Aldactone. Pt states due to leg cramping she believes that her Potassium is low again.

## 2017-11-05 NOTE — ED Notes (Signed)
Pt ambulatory to toilet with slow steady gait noted.

## 2017-11-06 LAB — BASIC METABOLIC PANEL
Anion gap: 12 (ref 5–15)
BUN: 48 mg/dL — ABNORMAL HIGH (ref 6–20)
CO2: 33 mmol/L — AB (ref 22–32)
CREATININE: 1.56 mg/dL — AB (ref 0.44–1.00)
Calcium: 9.4 mg/dL (ref 8.9–10.3)
Chloride: 93 mmol/L — ABNORMAL LOW (ref 101–111)
GFR calc Af Amer: 40 mL/min — ABNORMAL LOW (ref >60)
GFR calc non Af Amer: 35 mL/min — ABNORMAL LOW (ref >60)
GLUCOSE: 145 mg/dL — AB (ref 65–99)
Potassium: 2.6 mmol/L — CL (ref 3.5–5.1)
Sodium: 138 mmol/L (ref 135–145)

## 2017-11-06 LAB — MAGNESIUM: Magnesium: 2.5 mg/dL — ABNORMAL HIGH (ref 1.7–2.4)

## 2017-11-06 LAB — POTASSIUM: Potassium: 3.9 mmol/L (ref 3.5–5.1)

## 2017-11-06 MED ORDER — POTASSIUM CHLORIDE CRYS ER 20 MEQ PO TBCR
40.0000 meq | EXTENDED_RELEASE_TABLET | Freq: Once | ORAL | Status: AC
  Administered 2017-11-06: 40 meq via ORAL
  Filled 2017-11-06: qty 2

## 2017-11-06 MED ORDER — POTASSIUM CHLORIDE 10 MEQ/100ML IV SOLN
10.0000 meq | INTRAVENOUS | Status: AC
  Administered 2017-11-06 (×4): 10 meq via INTRAVENOUS
  Filled 2017-11-06 (×4): qty 100

## 2017-11-06 MED ORDER — TORSEMIDE 20 MG PO TABS: 60.0000 mg | ORAL_TABLET | Freq: Two times a day (BID) | ORAL | 0 refills | Status: DC

## 2017-11-06 MED ORDER — ALUM & MAG HYDROXIDE-SIMETH 200-200-20 MG/5ML PO SUSP
30.0000 mL | ORAL | Status: AC
  Administered 2017-11-06: 30 mL via ORAL
  Filled 2017-11-06: qty 30

## 2017-11-06 MED ORDER — POTASSIUM CHLORIDE 20 MEQ PO PACK
60.0000 meq | PACK | Freq: Once | ORAL | Status: AC
  Administered 2017-11-06: 60 meq via ORAL
  Filled 2017-11-06: qty 3

## 2017-11-06 MED ORDER — POTASSIUM CHLORIDE CRYS ER 20 MEQ PO TBCR: 40.0000 meq | EXTENDED_RELEASE_TABLET | Freq: Two times a day (BID) | ORAL | 0 refills | Status: DC

## 2017-11-06 NOTE — Progress Notes (Signed)
Pt has been discharged but is complaining of heart burn. Pt request maloox. I will place standing order for maloox and continue to assess.

## 2017-11-06 NOTE — Progress Notes (Signed)
CRITICAL VALUE STICKER  CRITICAL VALUE: Potassium 2.6  MD NOTIFIED: Dr. Marcille Blanco  RESPONSE: add on magnesium lab & 4 runs of IV potassium.  Will continue to monitor.   Conley Simmonds, RN, BSN

## 2017-11-06 NOTE — Progress Notes (Signed)
Dr. Jannifer Franklin paged because potassium in fluids due to stop, however potassium is only at 3.0 with nothing else to help replace it. Dr. Jannifer Franklin to add a dose of PO potassium when fluids stop. Will continue to monitor.  Conley Simmonds, RN, BSN

## 2017-11-12 ENCOUNTER — Other Ambulatory Visit: Payer: Self-pay

## 2017-11-12 ENCOUNTER — Encounter: Payer: Self-pay | Admitting: *Deleted

## 2017-11-12 DIAGNOSIS — W182XXA Fall in (into) shower or empty bathtub, initial encounter: Secondary | ICD-10-CM | POA: Diagnosis present

## 2017-11-12 DIAGNOSIS — S20221A Contusion of right back wall of thorax, initial encounter: Secondary | ICD-10-CM | POA: Diagnosis present

## 2017-11-12 DIAGNOSIS — E876 Hypokalemia: Principal | ICD-10-CM | POA: Diagnosis present

## 2017-11-12 DIAGNOSIS — Z882 Allergy status to sulfonamides status: Secondary | ICD-10-CM

## 2017-11-12 DIAGNOSIS — K219 Gastro-esophageal reflux disease without esophagitis: Secondary | ICD-10-CM | POA: Diagnosis present

## 2017-11-12 DIAGNOSIS — Z8249 Family history of ischemic heart disease and other diseases of the circulatory system: Secondary | ICD-10-CM

## 2017-11-12 DIAGNOSIS — F419 Anxiety disorder, unspecified: Secondary | ICD-10-CM | POA: Diagnosis present

## 2017-11-12 DIAGNOSIS — Z8673 Personal history of transient ischemic attack (TIA), and cerebral infarction without residual deficits: Secondary | ICD-10-CM

## 2017-11-12 DIAGNOSIS — E785 Hyperlipidemia, unspecified: Secondary | ICD-10-CM | POA: Diagnosis present

## 2017-11-12 DIAGNOSIS — I110 Hypertensive heart disease with heart failure: Secondary | ICD-10-CM | POA: Diagnosis present

## 2017-11-12 DIAGNOSIS — I5032 Chronic diastolic (congestive) heart failure: Secondary | ICD-10-CM | POA: Diagnosis present

## 2017-11-12 DIAGNOSIS — Y92002 Bathroom of unspecified non-institutional (private) residence single-family (private) house as the place of occurrence of the external cause: Secondary | ICD-10-CM

## 2017-11-12 DIAGNOSIS — G4730 Sleep apnea, unspecified: Secondary | ICD-10-CM | POA: Diagnosis present

## 2017-11-12 DIAGNOSIS — Z888 Allergy status to other drugs, medicaments and biological substances status: Secondary | ICD-10-CM

## 2017-11-12 DIAGNOSIS — S0990XA Unspecified injury of head, initial encounter: Secondary | ICD-10-CM | POA: Diagnosis present

## 2017-11-12 DIAGNOSIS — Z7982 Long term (current) use of aspirin: Secondary | ICD-10-CM

## 2017-11-12 DIAGNOSIS — Z79899 Other long term (current) drug therapy: Secondary | ICD-10-CM

## 2017-11-12 DIAGNOSIS — E669 Obesity, unspecified: Secondary | ICD-10-CM | POA: Diagnosis present

## 2017-11-12 DIAGNOSIS — Z6841 Body Mass Index (BMI) 40.0 and over, adult: Secondary | ICD-10-CM

## 2017-11-12 DIAGNOSIS — I2510 Atherosclerotic heart disease of native coronary artery without angina pectoris: Secondary | ICD-10-CM | POA: Diagnosis present

## 2017-11-12 DIAGNOSIS — R296 Repeated falls: Secondary | ICD-10-CM | POA: Diagnosis present

## 2017-11-12 DIAGNOSIS — N8110 Cystocele, unspecified: Secondary | ICD-10-CM | POA: Diagnosis present

## 2017-11-12 NOTE — ED Triage Notes (Signed)
Pt fell after getting out of bathtub today.  Pt has upper back pain and a headache.  No loc.  No vomiting.  Pt alert.  Speech clear.

## 2017-11-13 ENCOUNTER — Encounter: Payer: Self-pay | Admitting: Internal Medicine

## 2017-11-13 ENCOUNTER — Other Ambulatory Visit: Payer: Self-pay

## 2017-11-13 ENCOUNTER — Emergency Department: Payer: Self-pay

## 2017-11-13 ENCOUNTER — Inpatient Hospital Stay
Admission: EM | Admit: 2017-11-13 | Discharge: 2017-11-14 | DRG: 641 | Disposition: A | Discharge status: Home / Self Care | Payer: Self-pay | Attending: Internal Medicine | Admitting: Internal Medicine

## 2017-11-13 DIAGNOSIS — E876 Hypokalemia: Secondary | ICD-10-CM | POA: Diagnosis present

## 2017-11-13 DIAGNOSIS — R531 Weakness: Secondary | ICD-10-CM

## 2017-11-13 DIAGNOSIS — W19XXXA Unspecified fall, initial encounter: Secondary | ICD-10-CM

## 2017-11-13 DIAGNOSIS — S20221A Contusion of right back wall of thorax, initial encounter: Secondary | ICD-10-CM

## 2017-11-13 DIAGNOSIS — I5032 Chronic diastolic (congestive) heart failure: Secondary | ICD-10-CM

## 2017-11-13 LAB — BASIC METABOLIC PANEL
ANION GAP: 13 (ref 5–15)
Anion gap: 10 (ref 5–15)
BUN: 48 mg/dL — AB (ref 6–20)
BUN: 51 mg/dL — AB (ref 6–20)
CHLORIDE: 86 mmol/L — AB (ref 101–111)
CHLORIDE: 91 mmol/L — AB (ref 101–111)
CO2: 34 mmol/L — AB (ref 22–32)
CO2: 32 mmol/L (ref 22–32)
CREATININE: 1.49 mg/dL — AB (ref 0.44–1.00)
Calcium: 9.1 mg/dL (ref 8.9–10.3)
Calcium: 8.7 mg/dL — ABNORMAL LOW (ref 8.9–10.3)
Creatinine, Ser: 1.49 mg/dL — ABNORMAL HIGH (ref 0.44–1.00)
GFR calc Af Amer: 42 mL/min — ABNORMAL LOW (ref >60)
GFR calc Af Amer: 42 mL/min — ABNORMAL LOW (ref >60)
GFR calc non Af Amer: 36 mL/min — ABNORMAL LOW (ref >60)
GFR, EST NON AFRICAN AMERICAN: 36 mL/min — AB (ref >60)
GLUCOSE: 151 mg/dL — AB (ref 65–99)
GLUCOSE: 151 mg/dL — AB (ref 65–99)
POTASSIUM: 3 mmol/L — AB (ref 3.5–5.1)
SODIUM: 133 mmol/L — AB (ref 135–145)
Sodium: 133 mmol/L — ABNORMAL LOW (ref 135–145)

## 2017-11-13 LAB — CBC
HEMATOCRIT: 34.1 % — AB (ref 35.0–47.0)
HEMOGLOBIN: 11.9 g/dL — AB (ref 12.0–16.0)
MCH: 31.2 pg (ref 26.0–34.0)
MCHC: 34.8 g/dL (ref 32.0–36.0)
MCV: 89.7 fL (ref 80.0–100.0)
Platelets: 205 10*3/uL (ref 150–440)
RBC: 3.8 MIL/uL (ref 3.80–5.20)
RDW: 13.9 % (ref 11.5–14.5)
WBC: 12.1 10*3/uL — AB (ref 3.6–11.0)

## 2017-11-13 LAB — MAGNESIUM: MAGNESIUM: 2.3 mg/dL (ref 1.7–2.4)

## 2017-11-13 LAB — PHOSPHORUS: Phosphorus: 4 mg/dL (ref 2.5–4.6)

## 2017-11-13 MED ORDER — POTASSIUM CHLORIDE 10 MEQ/100ML IV SOLN
10.0000 meq | INTRAVENOUS | Status: AC
  Administered 2017-11-13 (×4): 10 meq via INTRAVENOUS
  Filled 2017-11-13 (×4): qty 100

## 2017-11-13 MED ORDER — POTASSIUM CHLORIDE CRYS ER 20 MEQ PO TBCR
40.0000 meq | EXTENDED_RELEASE_TABLET | Freq: Once | ORAL | Status: AC
  Administered 2017-11-13: 40 meq via ORAL
  Filled 2017-11-13: qty 2

## 2017-11-13 MED ORDER — TRAZODONE HCL 50 MG PO TABS
50.0000 mg | ORAL_TABLET | Freq: Every day | ORAL | Status: DC
  Administered 2017-11-13: 50 mg via ORAL
  Filled 2017-11-13: qty 1

## 2017-11-13 MED ORDER — ALUM & MAG HYDROXIDE-SIMETH 200-200-20 MG/5ML PO SUSP
30.0000 mL | Freq: Four times a day (QID) | ORAL | Status: DC | PRN
  Administered 2017-11-13: 30 mL via ORAL
  Filled 2017-11-13: qty 30

## 2017-11-13 MED ORDER — NITROGLYCERIN 0.4 MG SL SUBL: 0.4000 mg | SUBLINGUAL_TABLET | SUBLINGUAL | Status: DC | PRN

## 2017-11-13 MED ORDER — ACETAMINOPHEN 325 MG PO TABS
650.0000 mg | ORAL_TABLET | Freq: Four times a day (QID) | ORAL | Status: DC | PRN
  Administered 2017-11-13: 650 mg via ORAL
  Filled 2017-11-13: qty 2

## 2017-11-13 MED ORDER — KETOROLAC TROMETHAMINE 30 MG/ML IJ SOLN
30.0000 mg | Freq: Once | INTRAMUSCULAR | Status: AC
  Administered 2017-11-13: 30 mg via INTRAVENOUS
  Filled 2017-11-13: qty 1

## 2017-11-13 MED ORDER — DOCUSATE SODIUM 100 MG PO CAPS
100.0000 mg | ORAL_CAPSULE | Freq: Every day | ORAL | Status: DC
  Administered 2017-11-13 – 2017-11-14 (×2): 100 mg via ORAL
  Filled 2017-11-13 (×2): qty 1

## 2017-11-13 MED ORDER — PANTOPRAZOLE SODIUM 40 MG PO TBEC
40.0000 mg | DELAYED_RELEASE_TABLET | Freq: Every day | ORAL | Status: DC
  Administered 2017-11-13 – 2017-11-14 (×2): 40 mg via ORAL
  Filled 2017-11-13 (×2): qty 1

## 2017-11-13 MED ORDER — ONDANSETRON HCL 4 MG PO TABS: 4.0000 mg | ORAL_TABLET | Freq: Four times a day (QID) | ORAL | Status: DC | PRN

## 2017-11-13 MED ORDER — MORPHINE SULFATE (PF) 4 MG/ML IV SOLN
4.0000 mg | Freq: Once | INTRAVENOUS | Status: AC
  Administered 2017-11-13: 4 mg via INTRAVENOUS
  Filled 2017-11-13: qty 1

## 2017-11-13 MED ORDER — ACETAMINOPHEN 650 MG RE SUPP: 650.0000 mg | Freq: Four times a day (QID) | RECTAL | Status: DC | PRN

## 2017-11-13 MED ORDER — TICAGRELOR 90 MG PO TABS
90.0000 mg | ORAL_TABLET | Freq: Two times a day (BID) | ORAL | Status: DC
  Administered 2017-11-13 – 2017-11-14 (×3): 90 mg via ORAL
  Filled 2017-11-13 (×4): qty 1

## 2017-11-13 MED ORDER — METOPROLOL TARTRATE 25 MG PO TABS: 12.5000 mg | ORAL_TABLET | Freq: Two times a day (BID) | ORAL | Status: DC

## 2017-11-13 MED ORDER — PAROXETINE HCL 20 MG PO TABS
30.0000 mg | ORAL_TABLET | Freq: Every day | ORAL | Status: DC
  Administered 2017-11-13 – 2017-11-14 (×2): 30 mg via ORAL
  Filled 2017-11-13 (×2): qty 1.5

## 2017-11-13 MED ORDER — ONDANSETRON HCL 4 MG/2ML IJ SOLN
4.0000 mg | Freq: Once | INTRAMUSCULAR | Status: AC
  Administered 2017-11-13: 4 mg via INTRAVENOUS
  Filled 2017-11-13: qty 2

## 2017-11-13 MED ORDER — VITAMIN D 1000 UNITS PO TABS
5000.0000 [IU] | ORAL_TABLET | Freq: Every day | ORAL | Status: DC
  Administered 2017-11-13 – 2017-11-14 (×2): 5000 [IU] via ORAL
  Filled 2017-11-13 (×2): qty 5

## 2017-11-13 MED ORDER — SPIRONOLACTONE 25 MG PO TABS
25.0000 mg | ORAL_TABLET | Freq: Every day | ORAL | Status: DC
  Administered 2017-11-13: 25 mg via ORAL
  Filled 2017-11-13 (×2): qty 1

## 2017-11-13 MED ORDER — SODIUM CHLORIDE 0.9 % IV BOLUS
1000.0000 mL | Freq: Once | INTRAVENOUS | Status: AC
  Administered 2017-11-13: 1000 mL via INTRAVENOUS

## 2017-11-13 MED ORDER — ONDANSETRON HCL 4 MG/2ML IJ SOLN: 4.0000 mg | Freq: Four times a day (QID) | INTRAMUSCULAR | Status: DC | PRN

## 2017-11-13 MED ORDER — TRIAMCINOLONE ACETONIDE 0.025 % EX OINT
1.0000 "application " | TOPICAL_OINTMENT | CUTANEOUS | Status: DC
  Filled 2017-11-13: qty 15

## 2017-11-13 MED ORDER — POTASSIUM CHLORIDE CRYS ER 20 MEQ PO TBCR
40.0000 meq | EXTENDED_RELEASE_TABLET | Freq: Two times a day (BID) | ORAL | Status: DC
  Administered 2017-11-13 – 2017-11-14 (×3): 40 meq via ORAL
  Filled 2017-11-13 (×3): qty 2

## 2017-11-13 MED ORDER — ASPIRIN EC 81 MG PO TBEC
81.0000 mg | DELAYED_RELEASE_TABLET | Freq: Every day | ORAL | Status: DC
  Administered 2017-11-13 – 2017-11-14 (×2): 81 mg via ORAL
  Filled 2017-11-13 (×2): qty 1

## 2017-11-13 MED ORDER — LORATADINE 10 MG PO TABS: 10.0000 mg | ORAL_TABLET | Freq: Every day | ORAL | Status: DC | PRN

## 2017-11-13 MED ORDER — ROSUVASTATIN CALCIUM 10 MG PO TABS
20.0000 mg | ORAL_TABLET | Freq: Every day | ORAL | Status: DC
  Administered 2017-11-13: 20 mg via ORAL
  Filled 2017-11-13: qty 2

## 2017-11-13 NOTE — ED Notes (Signed)
Floor unable to take report at this time.

## 2017-11-13 NOTE — Care Management Important Message (Signed)
Important Message  Patient Details  Name: Destiny [name] MRN: [id number] Date of Birth: 01-23-1956   Medicare Important Message Given:  Yes    Shelbie Ammons, RN 11/13/2017, 2:42 PM

## 2017-11-13 NOTE — Progress Notes (Signed)
Patient ID: Destiny [name], female   DOB: [birth year], 62 y.o.   MRN: [id number]  ACP note   patient and family at bedside  Diagnosis: Severe hypokalemia, obesity and sleep apnea, history of CAD, history of diastolic congestive heart failure, anxiety and depression, GERD, history of stroke, weakness and falls, head trauma with fall.  Plan: Replace potassium.  Check magnesium and phosphorus.  Stop water pills.  Patient especially does not need Zaroxolyn.  Hold torsemide at this time.  Get physical therapy evaluation.  Monitor closely for concussion signs.  CODE STATUS discussed.  Patient would like to be a full code.  Time spent on ACP discussion 17 minutes  Dr. Loletha Grayer

## 2017-11-13 NOTE — ED Provider Notes (Signed)
[facility] Emergency Department Provider Note   ____________________________________________   First MD Initiated Contact with Patient 11/13/17 0502     (approximate)  I have reviewed the triage vital signs and the nursing notes.   HISTORY  Chief Complaint Fall    HPI Destiny [name] is a 62 y.o. female who comes into the hospital today with some back pain after a fall.  The patient reports that she was getting out of the shower yesterday morning.  She reports that she took 1 foot out of the tub and the next thing she knew she fell back into the tub.  She reports that she hit her back on the towel rack and soap bar.  She reports that she has had pain, shoulder pain neck pain.  The patient spouse had to get her out of the tub.  The patient rates her pain 8 out of 10 in intensity currently.  The patient's family is concerned about possible low potassium.  The patient was admitted to the hospital last week with low potassium.  She has not been stable on her legs and she has been having leg cramping.  She does have a bruise on her back.  She is here for evaluation.  Past Medical History:  Diagnosis Date  . CAD (coronary artery disease)   . CVA (cerebral infarction)   . Female bladder prolapse   . GERD (gastroesophageal reflux disease)   . Hyperlipidemia   . Hypertension   . Mitral valve disease   . Sleep apnea   . Stroke [facility])    x3    Patient Active Problem List   Diagnosis Date Noted  . Hypokalemia 11/05/2017  . Chest pain 02/14/2016  . Small bowel obstruction ([place]) 06/16/2015    Past Surgical History:  Procedure Laterality Date  . CARPAL TUNNEL RELEASE Bilateral   . CHOLECYSTECTOMY    . COLON SURGERY    . TENNIS ELBOW RELEASE/NIRSCHEL PROCEDURE Left   . TONSILLECTOMY      Prior to Admission medications   Medication Sig Start Date End Date Taking? Authorizing Provider  acetaminophen (TYLENOL) 500 MG tablet Take 1,000 mg by mouth every 6  (six) hours as needed for headache.     [provider]  aspirin EC 81 MG tablet Take 81 mg by mouth every morning.     [provider]  benzonatate (TESSALON PERLES) 100 MG capsule Take 1 capsule (100 mg total) by mouth 3 (three) times daily as needed for cough. 08/06/17 08/06/18  Delman Kitten, MD  cetirizine (ZYRTEC) 10 MG tablet Take 1 tablet (10 mg total) by mouth daily. Patient taking differently: Take 10 mg by mouth daily as needed for allergies.  12/22/16   Triplett, Johnette Abraham B, FNP  Cholecalciferol (VITAMIN D3) 5000 UNITS CAPS Take 5,000 Units by mouth daily.    [provider]  Docusate Calcium (STOOL SOFTENER PO) Take 1 capsule by mouth daily.    [provider]  esomeprazole (NEXIUM) 40 MG capsule Take 40 mg by mouth 2 (two) times daily.    [provider]  metolazone (ZAROXOLYN) 2.5 MG tablet Take 2.5 mg by mouth daily as needed for fluid. For up to 3 days. 11/02/17 12/02/17  [provider]  metoprolol tartrate (LOPRESSOR) 25 MG tablet Take 0.5 tablets (12.5 mg total) by mouth 2 (two) times daily. 02/15/16   Hillary Bow, MD  nitroGLYCERIN (NITROSTAT) 0.4 MG SL tablet Place 0.4 mg under the tongue every 5 (five)  minutes as needed for chest pain.    [provider]  PARoxetine (PAXIL) 30 MG tablet Take 30 mg by mouth daily.    [provider]  potassium chloride SA (K-DUR,KLOR-CON) 20 MEQ tablet Take 2 tablets (40 mEq total) by mouth 2 (two) times daily. 11/06/17   Hillary Bow, MD  rosuvastatin (CRESTOR) 20 MG tablet Take 20 mg by mouth at bedtime.     [provider]  spironolactone (ALDACTONE) 25 MG tablet Take 25 mg by mouth daily. 11/02/17 11/02/18  [provider]  ticagrelor (BRILINTA) 90 MG TABS tablet Take 90 mg by mouth 2 (two) times daily. 09/14/17   [provider]  torsemide (DEMADEX) 20 MG tablet Take 3 tablets (60 mg total) by mouth 2 (two) times daily. 11/06/17 11/06/18  Hillary Bow, MD   traZODone (DESYREL) 50 MG tablet Take 50 mg by mouth at bedtime.    [provider]  triamcinolone (KENALOG) 0.025 % ointment Apply 1 application topically 2 (two) times daily. Patient taking differently: Apply 1 application topically every Monday, Wednesday, and Friday.  02/01/17   Laban Emperor, PA-C    Allergies Atorvastatin; Fluconazole; Sertraline; Sulfa antibiotics; Sulfamethoxazole-trimethoprim; and Sulfasalazine  Family History  Problem Relation Age of Onset  . Heart disease Other   . Hypertension Other     Social History Social History   Tobacco Use  . Smoking status: Never Smoker  . Smokeless tobacco: Never Used  Substance Use Topics  . Alcohol use: No    Alcohol/week: 0.0 oz  . Drug use: No    Review of Systems  Constitutional: No fever/chills Eyes: No visual changes. ENT: No sore throat. Cardiovascular: Denies chest pain. Respiratory: Denies shortness of breath. Gastrointestinal: No abdominal pain.  No nausea, no vomiting.  No diarrhea.  No constipation. Genitourinary: Negative for dysuria. Musculoskeletal:  back pain, neck pain Skin: Negative for rash. Neurological: Headache   ____________________________________________   PHYSICAL EXAM:  VITAL SIGNS: ED Triage Vitals  Enc Vitals Group     BP 11/12/17 2346 100/60     Pulse Rate 11/12/17 2346 64     Resp 11/12/17 2346 18     Temp 11/12/17 2346 99.3 F (37.4 C)     Temp Source 11/12/17 2346 Oral     SpO2 11/12/17 2346 97 %     Weight 11/12/17 2343 213 lb (96.6 kg)     Height 11/12/17 2343 5\' 2"  (1.575 m)     Head Circumference --      Peak Flow --      Pain Score 11/12/17 2343 8     Pain Loc --      Pain Edu? --      Excl. in [place]? --     Constitutional: Alert and oriented. Well appearing and in moderate distress. Eyes: Conjunctivae are normal. PERRL. EOMI. Head: Atraumatic. Nose: No congestion/rhinnorhea. Mouth/Throat: Mucous membranes are moist.  Oropharynx  non-erythematous. Cardiovascular: Normal rate, regular rhythm. Grossly normal heart sounds.  Good peripheral circulation. Respiratory: Normal respiratory effort.  No retractions. Lungs CTAB. Gastrointestinal: Soft and nontender. No distention.  Positive bowel sounds Musculoskeletal: No lower extremity tenderness nor edema.   Neurologic:  Normal speech and language.  Skin:  Skin is warm, dry and intact.  Bruise noted to right flank Psychiatric: Mood and affect are normal.   ____________________________________________   LABS (all labs ordered are listed, but only abnormal results are displayed)  Labs Reviewed  CBC - Abnormal; Notable for the following components:  Result Value   WBC 12.1 (*)    Hemoglobin 11.9 (*)    HCT 34.1 (*)    All other components within normal limits  BASIC METABOLIC PANEL - Abnormal; Notable for the following components:   Sodium 133 (*)    Potassium <2.0 (*)    Chloride 86 (*)    CO2 34 (*)    Glucose, Bld 151 (*)    BUN 48 (*)    Creatinine, Ser 1.49 (*)    GFR calc non Af Amer 36 (*)    GFR calc Af Amer 42 (*)    All other components within normal limits  PHOSPHORUS  MAGNESIUM   ____________________________________________  EKG  ED ECG REPORT I, Loney Hering, the attending physician, personally viewed and interpreted this ECG.   Date: 11/13/2017  EKG Time: 735  Rate: 74  Rhythm: normal sinus rhythm  Axis: normal  Intervals:none  ST&T Change: none  ____________________________________________  RADIOLOGY  ED MD interpretation:  CT head and cervical spine: No acute intracranial process, Stable atrophy with moderate chronic small vessel ischemic disease with remote left frontal and parietal lobe infarcts with additional small remote left cerebellar infarct. Dilated perivascular space at the left lentiform nucleus, no acute traumatic injury within cervical spine, mild degenerate spondylolysis at C5-6 and C6-7  CT chest: NO acute  traumatic injury within the thoracic spine, Small focus of nodular ground glass opacity within the posterior left upper lobe suspicious for a small area of acute endobronchial infection, no other acute abnormality  Official radiology report(s): Ct Head Wo Contrast  Result Date: 11/13/2017 CLINICAL DATA:  Initial evaluation for acute headache, neck pain, recent fall. EXAM: CT HEAD WITHOUT CONTRAST CT CERVICAL SPINE WITHOUT CONTRAST TECHNIQUE: Multidetector CT imaging of the head and cervical spine was performed following the standard protocol without intravenous contrast. Multiplanar CT image reconstructions of the cervical spine were also generated. COMPARISON:  Prior CT from 12/17/2014. FINDINGS: CT HEAD FINDINGS Brain: Atrophy with chronic small vessel ischemic disease. Remote left frontal and parietal infarcts, with additional small remote left cerebellar infarct. Dilated perivascular space at the left lentiform nucleus. No acute intracranial hemorrhage. No acute large vessel territory infarct. No mass lesion, midline shift or mass effect. No hydrocephalus. No extra-axial fluid collection. Vascular: No hyperdense vessel. Scattered vascular calcifications noted within the carotid siphons. Skull: Scalp soft tissues and calvarium within normal limits. Sinuses/Orbits: Globes and orbital soft tissues normal. Mild left sphenoid sinus disease. Paranasal sinuses and mastoid air cells are otherwise clear. Other: None. CT CERVICAL SPINE FINDINGS Alignment: Straightening of the normal cervical lordosis. No listhesis or malalignment. Skull base and vertebrae: Skull base intact. Normal C1-2 articulations are preserved in the dens is intact. Vertebral body heights maintained. No acute fracture. Soft tissues and spinal canal: No acute soft tissue abnormality within the neck. Thyroid diffusely enlarged, like related to goiter. No prevertebral edema. Spinal canal within normal limits. Disc levels:  Mild degenerate  spondylolysis at C5-6 and C6-7. Upper chest: Visualized upper chest demonstrates no acute abnormality. Other: None. IMPRESSION: CT BRAIN: 1. No acute intracranial process. 2. Stable atrophy with moderate chronic small vessel ischemic disease with remote left frontal and parietal lobe infarcts, with additional small remote left cerebellar infarct. 3. Dilated perivascular space at the left lentiform nucleus. CT CERVICAL SPINE: 1. No acute traumatic injury within cervical spine. 2. 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 Electronically Signed   By: Jeannine Boga M.D.   On: 11/13/2017 06:30  Ct Chest Wo Contrast  Result Date: 11/13/2017 CLINICAL DATA:  Initial evaluation for acute back pain, minor trauma. EXAM: CT CHEST WITHOUT CONTRAST TECHNIQUE: Multidetector CT imaging of the chest was performed following the standard protocol without IV contrast. COMPARISON:  Prior radiograph from 11/05/2017. FINDINGS: Cardiovascular: Intrathoracic aorta of normal caliber without aneurysm or other acute abnormality. Mild atheromatous plaque about the aortic arch and descending intrathoracic aorta. Partially visualized great vessels within normal limits. Heart size normal. No pericardial effusion. Extensive 3 vessel coronary artery calcifications. Mediastinum/Nodes: Thyroidal enlargement, likely related to goiter. No mediastinal hematoma. No enlarged mediastinal, hilar, or axillary lymph nodes. Esophagus normal. Lungs/Pleura: Tracheobronchial tree intact and patent. Lungs normally inflated. Minimal patchy ground-glass opacity within the posterior left upper lobe, suspicious for a small area of small airways disease/endobronchial infection (series 4, image 30). Lungs are otherwise clear. No focal infiltrates. No pulmonary edema or pleural effusion. No pneumothorax. No other worrisome pulmonary nodule or mass. Without focal Upper Abdomen: Visualized upper abdomen within normal limits. Musculoskeletal: Exaggeration  of the normal thoracic kyphosis. No acute osseous abnormality. No worrisome lytic or blastic osseous lesions. Visualized external soft tissues demonstrate no acute abnormality. IMPRESSION: 1. No acute traumatic injury within the thoracic spine. 2. Small focus of nodular ground-glass opacity within the posterior left upper lobe, suspicious for a small area of acute endobronchial infection/small airways disease. 3. No other acute abnormality within the chest. 4. Extensive 3 vessel coronary artery calcifications. Electronically Signed   By: Jeannine Boga M.D.   On: 11/13/2017 06:38   Ct Cervical Spine Wo Contrast  Result Date: 11/13/2017 CLINICAL DATA:  Initial evaluation for acute headache, neck pain, recent fall. EXAM: CT HEAD WITHOUT CONTRAST CT CERVICAL SPINE WITHOUT CONTRAST TECHNIQUE: Multidetector CT imaging of the head and cervical spine was performed following the standard protocol without intravenous contrast. Multiplanar CT image reconstructions of the cervical spine were also generated. COMPARISON:  Prior CT from 12/17/2014. FINDINGS: CT HEAD FINDINGS Brain: Atrophy with chronic small vessel ischemic disease. Remote left frontal and parietal infarcts, with additional small remote left cerebellar infarct. Dilated perivascular space at the left lentiform nucleus. No acute intracranial hemorrhage. No acute large vessel territory infarct. No mass lesion, midline shift or mass effect. No hydrocephalus. No extra-axial fluid collection. Vascular: No hyperdense vessel. Scattered vascular calcifications noted within the carotid siphons. Skull: Scalp soft tissues and calvarium within normal limits. Sinuses/Orbits: Globes and orbital soft tissues normal. Mild left sphenoid sinus disease. Paranasal sinuses and mastoid air cells are otherwise clear. Other: None. CT CERVICAL SPINE FINDINGS Alignment: Straightening of the normal cervical lordosis. No listhesis or malalignment. Skull base and vertebrae: Skull  base intact. Normal C1-2 articulations are preserved in the dens is intact. Vertebral body heights maintained. No acute fracture. Soft tissues and spinal canal: No acute soft tissue abnormality within the neck. Thyroid diffusely enlarged, like related to goiter. No prevertebral edema. Spinal canal within normal limits. 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 Upper chest: Visualized upper chest demonstrates no acute abnormality. Other: None. IMPRESSION: CT BRAIN: 1. No acute intracranial process. 2. Stable atrophy with moderate chronic small vessel ischemic disease with remote left frontal and parietal lobe infarcts, with additional small remote left cerebellar infarct. 3. Dilated perivascular space at the left lentiform nucleus. CT CERVICAL SPINE: 1. No acute traumatic injury within cervical spine. 2. 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 Electronically Signed   By: Jeannine Boga M.D.   On: 11/13/2017 06:30  ____________________________________________   PROCEDURES  Procedure(s) performed: None  Procedures  Critical Care performed: No  ____________________________________________   INITIAL IMPRESSION / ASSESSMENT AND PLAN / ED COURSE  As part of my medical decision making, I reviewed the following data within the electronic MEDICAL RECORD NUMBER Notes from prior ED visits and [place] Controlled Substance Database   This is a 62 year old female who comes into the hospital today after a fall at home.  The patient was admitted to the hospital a week ago with hypokalemia.  I did send the patient for a CT scan of her head cervical spine and chest looking for possible rib fractures.  The patient spouse was concerned about hypokalemia so I did send a CBC and a BMP. The patient CBC is unremarkable but the patient's BMP shows a potassium of less than 2.  I did give the patient a dose of 40 M EQ's orally of potassium and KCl IV x4 doses over 4 hours.  I also ordered a  phosphorus and the magnesium to be added onto the blood work.  The patient does not have any fractures on her CT chest and her CT of her head and cervical spine are unremarkable. The patient will be admitted to the hospital for further evaluation.       ____________________________________________   FINAL CLINICAL IMPRESSION(S) / ED DIAGNOSES  Final diagnoses:  Fall, initial encounter  Hypokalemia  Contusion of right back wall of thorax, initial encounter  Weakness     ED Discharge Orders    None       Note:  This document was prepared using Dragon voice recognition software and may include unintentional dictation errors.    Loney Hering, MD 11/13/17 [phone]

## 2017-11-13 NOTE — ED Notes (Signed)
Pt complains of headache, neck pain and back pain post fall on Friday. Pt states she slipped while drying off in shower falling backwards striking head on a towel rack.

## 2017-11-13 NOTE — ED Notes (Signed)
Critical potassium of less than 2 called from lab. Dr. Dahlia Client notified, order for ekg and cardiac monitoring received.

## 2017-11-13 NOTE — ED Notes (Signed)
Report to kaley, rn.  

## 2017-11-13 NOTE — H&P (Signed)
Destiny [name] at [place] NAME: Destiny [name]    MR#:  [id number]  DATE OF BIRTH:  08-22-1955  DATE OF ADMISSION:  11/13/2017  PRIMARY CARE PHYSICIAN: [name], MD   REQUESTING/REFERRING PHYSICIAN: Dr [name]  CHIEF COMPLAINT:   Chief Complaint  Patient presents with  . Fall    HISTORY OF PRESENT ILLNESS:  Destiny [name]  is a 62 y.o. female with a known history of recent admission with low potassium.  Comes back to the hospital after having a fall backwards in the bathtub.  She was taking a shower and drying off when she lifted one leg off the ground to try off she fell backwards after the other leg gave way.  She hit the back and her head.  She has a slight headache.  She came into the ER and found to have a very low potassium less than 2.  She does have some shaking of the legs especially at night.  She does take Zaroxolyn and torsemide.  Hospitalist services were contacted for further evaluation.  PAST MEDICAL HISTORY:   Past Medical History:  Diagnosis Date  . CAD (coronary artery disease)   . CVA (cerebral infarction)   . Female bladder prolapse   . GERD (gastroesophageal reflux disease)   . Hyperlipidemia   . Hypertension   . Mitral valve disease   . Sleep apnea   . Stroke [facility])    x3    PAST SURGICAL HISTORY:   Past Surgical History:  Procedure Laterality Date  . CARPAL TUNNEL RELEASE Bilateral   . CHOLECYSTECTOMY    . COLON SURGERY    . TENNIS ELBOW RELEASE/NIRSCHEL PROCEDURE Left   . TONSILLECTOMY      SOCIAL HISTORY:   Social History   Tobacco Use  . Smoking status: Never Smoker  . Smokeless tobacco: Never Used  Substance Use Topics  . Alcohol use: No    Alcohol/week: 0.0 oz    FAMILY HISTORY:   Family History  Problem Relation Age of Onset  . Heart disease Other   . Hypertension Other   . Rheum arthritis Mother   . CAD Father     DRUG ALLERGIES:   Allergies  Allergen  Reactions  . Atorvastatin Hives    No s/sx of anaphylaxis, just intermittent hives.  Does not seem to be a class effect as she has tolerated lovastatin previously No s/sx of anaphylaxis, just intermittent hives.  Does not seem to be a class effect as she has tolerated lovastatin previously  . Fluconazole Rash    Reaction to generic but not name brand Reaction to generic but not name brand CAN USE DIFLUCAN.DOES NOT BREAK OUT WITH DIFLUCAN.(allergic only to generic brand)  . Sertraline Itching  . Sulfa Antibiotics Rash and Other (See Comments)    "hot " sensation  . Sulfamethoxazole-Trimethoprim Rash and Other (See Comments)    "felt hot" Other reaction(s): Other (See Comments) "felt hot"  . Sulfasalazine Other (See Comments) and Rash    "hot " sensation    REVIEW OF SYSTEMS:  CONSTITUTIONAL: No fever, chills or sweats.  Positive for fatigue.  Her face felt hot yesterday. EYES: No blurred or double vision.  Wears glasses. EARS, NOSE, AND THROAT: No tinnitus or ear pain.  Positive for runny nose and sore throat RESPIRATORY: No cough.  positive for shortness of breath.  No wheezing or hemoptysis.  CARDIOVASCULAR: No chest pain, orthopnea, edema.  GASTROINTESTINAL: No nausea, vomiting, diarrhea  or abdominal pain. No blood in bowel movements.  Some abdominal cramps.  Some constipation. GENITOURINARY: No dysuria.  history of hematuria.  Has a pessary. ENDOCRINE: No polyuria, nocturia,  HEMATOLOGY: No anemia, easy bruising or bleeding SKIN: No rash or lesion. MUSCULOSKELETAL: No joint pain or arthritis.   NEUROLOGIC: No tingling, numbness, weakness.  PSYCHIATRY:  history of anxiety and trace depression.   MEDICATIONS AT HOME:   Prior to Admission medications   Medication Sig Start Date End Date Taking? Authorizing Provider  acetaminophen (TYLENOL) 500 MG tablet Take 1,000 mg by mouth every 6 (six) hours as needed for headache.     [provider]  aspirin EC 81 MG tablet  Take 81 mg by mouth every morning.     [provider]  benzonatate (TESSALON PERLES) 100 MG capsule Take 1 capsule (100 mg total) by mouth 3 (three) times daily as needed for cough. 08/06/17 08/06/18  Delman Kitten, MD  cetirizine (ZYRTEC) 10 MG tablet Take 1 tablet (10 mg total) by mouth daily. Patient taking differently: Take 10 mg by mouth daily as needed for allergies.  12/22/16   Triplett, Johnette Abraham B, FNP  Cholecalciferol (VITAMIN D3) 5000 UNITS CAPS Take 5,000 Units by mouth daily.    [provider]  Docusate Calcium (STOOL SOFTENER PO) Take 1 capsule by mouth daily.    [provider]  esomeprazole (NEXIUM) 40 MG capsule Take 40 mg by mouth 2 (two) times daily.    [provider]  metolazone (ZAROXOLYN) 2.5 MG tablet Take 2.5 mg by mouth daily as needed for fluid. For up to 3 days. 11/02/17 12/02/17  [provider]  metoprolol tartrate (LOPRESSOR) 25 MG tablet Take 0.5 tablets (12.5 mg total) by mouth 2 (two) times daily. 02/15/16   Hillary Bow, MD  nitroGLYCERIN (NITROSTAT) 0.4 MG SL tablet Place 0.4 mg under the tongue every 5 (five) minutes as needed for chest pain.    [provider]  PARoxetine (PAXIL) 30 MG tablet Take 30 mg by mouth daily.    [provider]  potassium chloride SA (K-DUR,KLOR-CON) 20 MEQ tablet Take 2 tablets (40 mEq total) by mouth 2 (two) times daily. 11/06/17   Hillary Bow, MD  rosuvastatin (CRESTOR) 20 MG tablet Take 20 mg by mouth at bedtime.     [provider]  spironolactone (ALDACTONE) 25 MG tablet Take 25 mg by mouth daily. 11/02/17 11/02/18  [provider]  ticagrelor (BRILINTA) 90 MG TABS tablet Take 90 mg by mouth 2 (two) times daily. 09/14/17   [provider]  torsemide (DEMADEX) 20 MG tablet Take 3 tablets (60 mg total) by mouth 2 (two) times daily. 11/06/17 11/06/18  Hillary Bow, MD  traZODone (DESYREL) 50 MG tablet Take 50 mg by mouth at bedtime.    [provider]  triamcinolone (KENALOG) 0.025 % ointment Apply 1 application topically 2 (two) times daily. Patient taking differently: Apply 1 application topically every Monday, Wednesday, and Friday.  02/01/17   Laban Emperor, PA-C      VITAL SIGNS:  Blood pressure 101/60, pulse 63, temperature 99.3 F (37.4 C), temperature source Oral, resp. rate 14, height 5\' 2"  (1.575 m), weight 96.6 kg (213 lb), SpO2 94 %.  PHYSICAL EXAMINATION:  GENERAL:  62 y.o.-year-old patient lying in the bed with no acute distress.  EYES: Pupils equal, round, reactive to light and accommodation. No scleral icterus. Extraocular muscles intact.  HEENT: Head atraumatic, normocephalic. Oropharynx and nasopharynx clear.  NECK:  Supple, no jugular venous distention. No thyroid enlargement, no tenderness.  LUNGS: Normal breath sounds bilaterally, no wheezing, rales,rhonchi or crepitation. No use of accessory muscles of respiration.  CARDIOVASCULAR: S1, S2 normal. No murmurs, rubs, or gallops.  ABDOMEN: Soft, nontender, nondistended. Bowel sounds present. No organomegaly or mass.  EXTREMITIES: 2+ pedal edema, no cyanosis, or clubbing.  NEUROLOGIC: Cranial nerves II through XII are intact. Muscle strength 5/5 in all extremities. Sensation intact. Gait not checked.  PSYCHIATRIC: The patient is alert and oriented x 3.  SKIN: two bruises on left back.  Prior bruise left abdomen secondary to heparin shots.   LABORATORY PANEL:   CBC Recent Labs  Lab 11/13/17 0517  WBC 12.1*  HGB 11.9*  HCT 34.1*  PLT 205   ------------------------------------------------------------------------------------------------------------------  Chemistries  Recent Labs  Lab 11/13/17 0517  NA 133*  K <2.0*  CL 86*  CO2 34*  GLUCOSE 151*  BUN 48*  CREATININE 1.49*  CALCIUM 9.1   ------------------------------------------------------------------------------------------------------------------    RADIOLOGY:  Ct Head Wo  Contrast  Result Date: 11/13/2017 CLINICAL DATA:  Initial evaluation for acute headache, neck pain, recent fall. EXAM: CT HEAD WITHOUT CONTRAST CT CERVICAL SPINE WITHOUT CONTRAST TECHNIQUE: Multidetector CT imaging of the head and cervical spine was performed following the standard protocol without intravenous contrast. Multiplanar CT image reconstructions of the cervical spine were also generated. COMPARISON:  Prior CT from 12/17/2014. FINDINGS: CT HEAD FINDINGS Brain: Atrophy with chronic small vessel ischemic disease. Remote left frontal and parietal infarcts, with additional small remote left cerebellar infarct. Dilated perivascular space at the left lentiform nucleus. No acute intracranial hemorrhage. No acute large vessel territory infarct. No mass lesion, midline shift or mass effect. No hydrocephalus. No extra-axial fluid collection. Vascular: No hyperdense vessel. Scattered vascular calcifications noted within the carotid siphons. Skull: Scalp soft tissues and calvarium within normal limits. Sinuses/Orbits: Globes and orbital soft tissues normal. Mild left sphenoid sinus disease. Paranasal sinuses and mastoid air cells are otherwise clear. Other: None. CT CERVICAL SPINE FINDINGS Alignment: Straightening of the normal cervical lordosis. No listhesis or malalignment. Skull base and vertebrae: Skull base intact. Normal C1-2 articulations are preserved in the dens is intact. Vertebral body heights maintained. No acute fracture. Soft tissues and spinal canal: No acute soft tissue abnormality within the neck. Thyroid diffusely enlarged, like related to goiter. No prevertebral edema. Spinal canal within normal limits. 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 Upper chest: Visualized upper chest demonstrates no acute abnormality. Other: None. IMPRESSION: CT BRAIN: 1. No acute intracranial process. 2. Stable atrophy with moderate chronic small vessel ischemic disease with remote left frontal  and parietal lobe infarcts, with additional small remote left cerebellar infarct. 3. Dilated perivascular space at the left lentiform nucleus. CT CERVICAL SPINE: 1. No acute traumatic injury within cervical spine. 2. 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 Electronically Signed   By: Jeannine Boga M.D.   On: 11/13/2017 06:30   Ct Chest Wo Contrast  Result Date: 11/13/2017 CLINICAL DATA:  Initial evaluation for acute back pain, minor trauma. EXAM: CT CHEST WITHOUT CONTRAST TECHNIQUE: Multidetector CT imaging of the chest was performed following the standard protocol without IV contrast. COMPARISON:  Prior radiograph from 11/05/2017. FINDINGS: Cardiovascular: Intrathoracic aorta of normal caliber without aneurysm or other acute abnormality. Mild atheromatous plaque about the aortic arch and descending intrathoracic aorta. Partially visualized great vessels within normal limits. Heart size normal. No pericardial effusion. Extensive 3 vessel coronary artery calcifications. Mediastinum/Nodes: Thyroidal enlargement,  likely related to goiter. No mediastinal hematoma. No enlarged mediastinal, hilar, or axillary lymph nodes. Esophagus normal. Lungs/Pleura: Tracheobronchial tree intact and patent. Lungs normally inflated. Minimal patchy ground-glass opacity within the posterior left upper lobe, suspicious for a small area of small airways disease/endobronchial infection (series 4, image 30). Lungs are otherwise clear. No focal infiltrates. No pulmonary edema or pleural effusion. No pneumothorax. No other worrisome pulmonary nodule or mass. Without focal Upper Abdomen: Visualized upper abdomen within normal limits. Musculoskeletal: Exaggeration of the normal thoracic kyphosis. No acute osseous abnormality. No worrisome lytic or blastic osseous lesions. Visualized external soft tissues demonstrate no acute abnormality. IMPRESSION: 1. No acute traumatic injury within the thoracic spine. 2. Small focus of  nodular ground-glass opacity within the posterior left upper lobe, suspicious for a small area of acute endobronchial infection/small airways disease. 3. No other acute abnormality within the chest. 4. Extensive 3 vessel coronary artery calcifications. Electronically Signed   By: Jeannine Boga M.D.   On: 11/13/2017 06:38   Ct Cervical Spine Wo Contrast  Result Date: 11/13/2017 CLINICAL DATA:  Initial evaluation for acute headache, neck pain, recent fall. EXAM: CT HEAD WITHOUT CONTRAST CT CERVICAL SPINE WITHOUT CONTRAST TECHNIQUE: Multidetector CT imaging of the head and cervical spine was performed following the standard protocol without intravenous contrast. Multiplanar CT image reconstructions of the cervical spine were also generated. COMPARISON:  Prior CT from 12/17/2014. FINDINGS: CT HEAD FINDINGS Brain: Atrophy with chronic small vessel ischemic disease. Remote left frontal and parietal infarcts, with additional small remote left cerebellar infarct. Dilated perivascular space at the left lentiform nucleus. No acute intracranial hemorrhage. No acute large vessel territory infarct. No mass lesion, midline shift or mass effect. No hydrocephalus. No extra-axial fluid collection. Vascular: No hyperdense vessel. Scattered vascular calcifications noted within the carotid siphons. Skull: Scalp soft tissues and calvarium within normal limits. Sinuses/Orbits: Globes and orbital soft tissues normal. Mild left sphenoid sinus disease. Paranasal sinuses and mastoid air cells are otherwise clear. Other: None. CT CERVICAL SPINE FINDINGS Alignment: Straightening of the normal cervical lordosis. No listhesis or malalignment. Skull base and vertebrae: Skull base intact. Normal C1-2 articulations are preserved in the dens is intact. Vertebral body heights maintained. No acute fracture. Soft tissues and spinal canal: No acute soft tissue abnormality within the neck. Thyroid diffusely enlarged, like related to goiter. No  prevertebral edema. Spinal canal within normal limits. 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 Upper chest: Visualized upper chest demonstrates no acute abnormality. Other: None. IMPRESSION: CT BRAIN: 1. No acute intracranial process. 2. Stable atrophy with moderate chronic small vessel ischemic disease with remote left frontal and parietal lobe infarcts, with additional small remote left cerebellar infarct. 3. Dilated perivascular space at the left lentiform nucleus. CT CERVICAL SPINE: 1. No acute traumatic injury within cervical spine. 2. 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 Electronically Signed   By: Jeannine Boga M.D.   On: 11/13/2017 06:30    EKG:   Sinus rhythm 74 bpm nonspecific ST-T wave changes.  IMPRESSION AND PLAN:   1.  Severe hypokalemia.  Replace potassium orally and IV.  Check magnesium and phosphorus and replace if low.  Zaroxolyn is not a good medication for this patient.  Since the patient's ejection fraction is normal range the patient probably can get by with a lower dose torsemide but I will hold this for now.  Can continue Spironolactone. 2.  Obesity and sleep apnea on CPAP at night 3.  History of  coronary artery disease on aspirin and Brilinta.  Hold metoprolol this morning with blood pressure being a little low and start tomorrow. 4.  History of diastolic congestive heart failure.  Hold water pills for right now.  No signs of heart failure at this point. 5.  Anxiety depression continue Paxil 6.  GERD on PPI 7.  History of stroke in the past on aspirin 8.  Weakness and falls.  Physical therapy consultation 9.  Head trauma on aspirin and Brilinta.  Continue to watch mental status closely.  CT scan of the head negative for bleed.  Could have concussion symptoms with her headache.    All the records are reviewed and case discussed with ED provider. Management plans discussed with the patient, family and they are in agreement.  CODE  STATUS: Full code  TOTAL TIME TAKING CARE OF THIS PATIENT: 50 minutes.    Loletha Grayer M.D on 11/13/2017 at 8:07 AM  Between 7am to 6pm - Pager - [phone]  After 6pm call admission pager [phone]  [facility]  [phone]  CC: Primary care physician; [name], MD

## 2017-11-14 LAB — BASIC METABOLIC PANEL
ANION GAP: 6 (ref 5–15)
BUN: 36 mg/dL — AB (ref 6–20)
CHLORIDE: 100 mmol/L — AB (ref 101–111)
CO2: 32 mmol/L (ref 22–32)
Calcium: 9.1 mg/dL (ref 8.9–10.3)
Creatinine, Ser: 1.21 mg/dL — ABNORMAL HIGH (ref 0.44–1.00)
GFR calc Af Amer: 54 mL/min — ABNORMAL LOW (ref >60)
GFR, EST NON AFRICAN AMERICAN: 47 mL/min — AB (ref >60)
GLUCOSE: 155 mg/dL — AB (ref 65–99)
POTASSIUM: 3.1 mmol/L — AB (ref 3.5–5.1)
Sodium: 138 mmol/L (ref 135–145)

## 2017-11-14 LAB — CBC
HCT: 29 % — ABNORMAL LOW (ref 35.0–47.0)
HEMOGLOBIN: 10 g/dL — AB (ref 12.0–16.0)
MCH: 31.2 pg (ref 26.0–34.0)
MCHC: 34.5 g/dL (ref 32.0–36.0)
MCV: 90.4 fL (ref 80.0–100.0)
Platelets: 170 10*3/uL (ref 150–440)
RBC: 3.21 MIL/uL — ABNORMAL LOW (ref 3.80–5.20)
RDW: 14.1 % (ref 11.5–14.5)
WBC: 6.7 10*3/uL (ref 3.6–11.0)

## 2017-11-14 MED ORDER — SPIRONOLACTONE 25 MG PO TABS
12.5000 mg | ORAL_TABLET | Freq: Every day | ORAL | Status: DC
  Administered 2017-11-14: 12.5 mg via ORAL
  Filled 2017-11-14: qty 1
  Filled 2017-11-14: qty 0.5

## 2017-11-14 MED ORDER — METOPROLOL SUCCINATE ER 25 MG PO TB24: 12.5000 mg | ORAL_TABLET | Freq: Every day | ORAL | Status: DC

## 2017-11-14 MED ORDER — TORSEMIDE 20 MG PO TABS: 20.0000 mg | ORAL_TABLET | Freq: Every day | ORAL | 0 refills | Status: DC

## 2017-11-14 MED ORDER — SPIRONOLACTONE 25 MG PO TABS: 12.5000 mg | ORAL_TABLET | Freq: Every day | ORAL | 0 refills | Status: DC

## 2017-11-14 MED ORDER — METOPROLOL SUCCINATE ER 25 MG PO TB24: 12.5000 mg | ORAL_TABLET | Freq: Every day | ORAL | 0 refills | Status: DC

## 2017-11-14 NOTE — Care Management (Signed)
Patient discharged home today.  PCP [name].  Patient scheduled for heart failure clinic 5/1. Patient uses [facility].  Confirmed  That aldactone was already picked up this month on 4/9.  Toprol changed to xl.  Out of pocket cost $16.09.  RNCM signing off.

## 2017-11-14 NOTE — Discharge Summary (Signed)
[facility] [place] NAME: Destiny [name]    MR#:  [id number]  DATE OF BIRTH:  1955-12-24  DATE OF ADMISSION:  11/13/2017 ADMITTING PHYSICIAN: Loletha Grayer, MD  DATE OF DISCHARGE: 11/14/2017  PRIMARY CARE PHYSICIAN: [name], MD    ADMISSION DIAGNOSIS:  Hypokalemia [E87.6] Weakness [R53.1] Contusion of right back wall of thorax, initial encounter [S20.221A] Fall, initial encounter [W19.XXXA]  DISCHARGE DIAGNOSIS:  Active Problems:   Hypokalemia   SECONDARY DIAGNOSIS:   Past Medical History:  Diagnosis Date  . CAD (coronary artery disease)   . CVA (cerebral infarction)   . Female bladder prolapse   . GERD (gastroesophageal reflux disease)   . Hyperlipidemia   . Hypertension   . Mitral valve disease   . Sleep apnea   . Stroke [facility])    x3    HOSPITAL COURSE:   1.  Severe hypokalemia.  Admission potassium less than 2.  Potassium was given IV and orally.  Magnesium and phosphorus were in the normal range.  Zaroxolyn was stopped and is not a good medication for this patient.  The patient's ejection fraction is in the normal range and can probably get by with torsemide 20 mg daily. I decreased dose of spironolactone to 12.5 mg daily.  Continue oral supplementation of potassium.  The patient wanted to be discharged from the hospital today.  Potassium is 3.1 today.  I recommend a repeat check on potassium on Thursday or Friday with her medical doctor. 2.  History of diastolic congestive heart failure.  No signs of CHF right at this time.  I did give fluid yesterday.  Can restart torsemide 20 mg daily with daily weights.  Referral to the CHF clinic. 3.  Obesity and sleep apnea on CPAP at night 4.  History of coronary artery disease on aspirin and Brilinta.  Switch the patient's metoprolol over to Toprol-XL at night.  Allergy to atorvastatin 5.  History of stroke on aspirin 6.  Anxiety depression on Paxil 7.  GERD on PPI 8.  Weakness  and falls.  Likely due to low potassium.  Patient wanted to go home today.  DISCHARGE CONDITIONS:   Satisfactory  CONSULTS OBTAINED:  None  DRUG ALLERGIES:   Allergies  Allergen Reactions  . Atorvastatin Hives    No s/sx of anaphylaxis, just intermittent hives.  Does not seem to be a class effect as she has tolerated lovastatin previously No s/sx of anaphylaxis, just intermittent hives.  Does not seem to be a class effect as she has tolerated lovastatin previously  . Fluconazole Rash    Reaction to generic but not name brand Reaction to generic but not name brand CAN USE DIFLUCAN.DOES NOT BREAK OUT WITH DIFLUCAN.(allergic only to generic brand)  . Sertraline Itching  . Sulfa Antibiotics Rash and Other (See Comments)    "hot " sensation  . Sulfamethoxazole-Trimethoprim Rash and Other (See Comments)    "felt hot" Other reaction(s): Other (See Comments) "felt hot"  . Sulfasalazine Other (See Comments) and Rash    "hot " sensation    DISCHARGE MEDICATIONS:   Allergies as of 11/14/2017      Reactions   Atorvastatin Hives   No s/sx of anaphylaxis, just intermittent hives.  Does not seem to be a class effect as she has tolerated lovastatin previously No s/sx of anaphylaxis, just intermittent hives.  Does not seem to be a class effect as she has tolerated lovastatin previously   Fluconazole Rash  Reaction to generic but not name brand Reaction to generic but not name brand CAN USE DIFLUCAN.DOES NOT BREAK OUT WITH DIFLUCAN.(allergic only to generic brand)   Sertraline Itching   Sulfa Antibiotics Rash, Other (See Comments)   "hot " sensation   Sulfamethoxazole-trimethoprim Rash, Other (See Comments)   "felt hot" Other reaction(s): Other (See Comments) "felt hot"   Sulfasalazine Other (See Comments), Rash   "hot " sensation      Medication List    STOP taking these medications   benzonatate 100 MG capsule Commonly known as:  TESSALON PERLES   metolazone 2.5 MG  tablet Commonly known as:  ZAROXOLYN   metoprolol tartrate 25 MG tablet Commonly known as:  LOPRESSOR     TAKE these medications   acetaminophen 500 MG tablet Commonly known as:  TYLENOL Take 1,000 mg by mouth every 6 (six) hours as needed for headache.   aspirin EC 81 MG tablet Take 81 mg by mouth every morning.   BRILINTA 90 MG Tabs tablet Generic drug:  ticagrelor Take 90 mg by mouth 2 (two) times daily.   cetirizine 10 MG tablet Commonly known as:  ZYRTEC Take 1 tablet (10 mg total) by mouth daily. What changed:    when to take this  reasons to take this   esomeprazole 40 MG capsule Commonly known as:  NEXIUM Take 40 mg by mouth 2 (two) times daily.   metoprolol succinate 25 MG 24 hr tablet Commonly known as:  TOPROL-XL Take 0.5 tablets (12.5 mg total) by mouth at bedtime.   nitroGLYCERIN 0.4 MG SL tablet Commonly known as:  NITROSTAT Place 0.4 mg under the tongue every 5 (five) minutes as needed for chest pain.   PARoxetine 30 MG tablet Commonly known as:  PAXIL Take 30 mg by mouth daily.   potassium chloride SA 20 MEQ tablet Commonly known as:  K-DUR,KLOR-CON Take 2 tablets (40 mEq total) by mouth 2 (two) times daily.   rosuvastatin 20 MG tablet Commonly known as:  CRESTOR Take 20 mg by mouth at bedtime.   spironolactone 25 MG tablet Commonly known as:  ALDACTONE Take 0.5 tablets (12.5 mg total) by mouth daily. What changed:  how much to take   STOOL SOFTENER PO Take 1 capsule by mouth daily.   torsemide 20 MG tablet Commonly known as:  DEMADEX Take 1 tablet (20 mg total) by mouth daily. What changed:    how much to take  when to take this   traZODone 50 MG tablet Commonly known as:  DESYREL Take 50 mg by mouth at bedtime.   triamcinolone 0.025 % ointment Commonly known as:  KENALOG Apply 1 application topically 2 (two) times daily. What changed:  when to take this   Vitamin D3 5000 units Caps Take 5,000 Units by mouth daily.         DISCHARGE INSTRUCTIONS:   Follow-up PMD 6 days Referral to the CHF clinic  If you experience worsening of your admission symptoms, develop shortness of breath, life threatening emergency, suicidal or homicidal thoughts you must seek medical attention immediately by calling 911 or calling your MD immediately  if symptoms less severe.  You Must read complete instructions/literature along with all the possible adverse reactions/side effects for all the Medicines you take and that have been prescribed to you. Take any new Medicines after you have completely understood and accept all the possible adverse reactions/side effects.   Please note  You were cared for by a hospitalist during your  hospital stay. If you have any questions about your discharge medications or the care you received while you were in the hospital after you are discharged, you can call the unit and asked to speak with the hospitalist on call if the hospitalist that took care of you is not available. Once you are discharged, your primary care physician will handle any further medical issues. Please note that NO REFILLS for any discharge medications will be authorized once you are discharged, as it is imperative that you return to your primary care physician (or establish a relationship with a primary care physician if you do not have one) for your aftercare needs so that they can reassess your need for medications and monitor your lab values.    Today   CHIEF COMPLAINT:   Chief Complaint  Patient presents with  . Fall    HISTORY OF PRESENT ILLNESS:  Suriya Kovarik  is a 62 y.o. female presented after a fall.   VITAL SIGNS:  Blood pressure 107/70, pulse 84, temperature 98.2 F (36.8 C), temperature source Oral, resp. rate 16, height 5\' 2"  (1.575 m), weight 101 kg (222 lb 9.6 oz), SpO2 99 %.   PHYSICAL EXAMINATION:  GENERAL:  62 y.o.-year-old patient lying in the bed with no acute distress.  EYES: Pupils equal,  round, reactive to light and accommodation. No scleral icterus. Extraocular muscles intact.  HEENT: Head atraumatic, normocephalic. Oropharynx and nasopharynx clear.  NECK:  Supple, no jugular venous distention. No thyroid enlargement, no tenderness.  LUNGS: Normal breath sounds bilaterally, no wheezing, rales,rhonchi or crepitation. No use of accessory muscles of respiration.  CARDIOVASCULAR: S1, S2 normal. No murmurs, rubs, or gallops.  ABDOMEN: Soft, non-tender, non-distended. Bowel sounds present. No organomegaly or mass.  EXTREMITIES: Trace lower extremity pedal edema, no cyanosis, or clubbing.  NEUROLOGIC: Cranial nerves II through XII are intact. Muscle strength 5/5 in all extremities. Sensation intact. Gait not checked.  PSYCHIATRIC: The patient is alert and oriented x 3.  SKIN: No obvious rash, lesion, or ulcer.   DATA REVIEW:   CBC Recent Labs  Lab 11/14/17 0503  WBC 6.7  HGB 10.0*  HCT 29.0*  PLT 170    Chemistries  Recent Labs  Lab 11/13/17 1428 11/14/17 0503  NA 133* 138  K 3.0* 3.1*  CL 91* 100*  CO2 32 32  GLUCOSE 151* 155*  BUN 51* 36*  CREATININE 1.49* 1.21*  CALCIUM 8.7* 9.1  MG 2.3  --       RADIOLOGY:  Ct Head Wo Contrast  Result Date: 11/13/2017 CLINICAL DATA:  Initial evaluation for acute headache, neck pain, recent fall. EXAM: CT HEAD WITHOUT CONTRAST CT CERVICAL SPINE WITHOUT CONTRAST TECHNIQUE: Multidetector CT imaging of the head and cervical spine was performed following the standard protocol without intravenous contrast. Multiplanar CT image reconstructions of the cervical spine were also generated. COMPARISON:  Prior CT from 12/17/2014. FINDINGS: CT HEAD FINDINGS Brain: Atrophy with chronic small vessel ischemic disease. Remote left frontal and parietal infarcts, with additional small remote left cerebellar infarct. Dilated perivascular space at the left lentiform nucleus. No acute intracranial hemorrhage. No acute large vessel territory  infarct. No mass lesion, midline shift or mass effect. No hydrocephalus. No extra-axial fluid collection. Vascular: No hyperdense vessel. Scattered vascular calcifications noted within the carotid siphons. Skull: Scalp soft tissues and calvarium within normal limits. Sinuses/Orbits: Globes and orbital soft tissues normal. Mild left sphenoid sinus disease. Paranasal sinuses and mastoid air cells are otherwise clear. Other: None. CT CERVICAL  SPINE FINDINGS Alignment: Straightening of the normal cervical lordosis. No listhesis or malalignment. Skull base and vertebrae: Skull base intact. Normal C1-2 articulations are preserved in the dens is intact. Vertebral body heights maintained. No acute fracture. Soft tissues and spinal canal: No acute soft tissue abnormality within the neck. Thyroid diffusely enlarged, like related to goiter. No prevertebral edema. Spinal canal within normal limits. 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 Upper chest: Visualized upper chest demonstrates no acute abnormality. Other: None. IMPRESSION: CT BRAIN: 1. No acute intracranial process. 2. Stable atrophy with moderate chronic small vessel ischemic disease with remote left frontal and parietal lobe infarcts, with additional small remote left cerebellar infarct. 3. Dilated perivascular space at the left lentiform nucleus. CT CERVICAL SPINE: 1. No acute traumatic injury within cervical spine. 2. 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 Electronically Signed   By: Jeannine Boga M.D.   On: 11/13/2017 06:30   Ct Chest Wo Contrast  Result Date: 11/13/2017 CLINICAL DATA:  Initial evaluation for acute back pain, minor trauma. EXAM: CT CHEST WITHOUT CONTRAST TECHNIQUE: Multidetector CT imaging of the chest was performed following the standard protocol without IV contrast. COMPARISON:  Prior radiograph from 11/05/2017. FINDINGS: Cardiovascular: Intrathoracic aorta of normal caliber without aneurysm or other  acute abnormality. Mild atheromatous plaque about the aortic arch and descending intrathoracic aorta. Partially visualized great vessels within normal limits. Heart size normal. No pericardial effusion. Extensive 3 vessel coronary artery calcifications. Mediastinum/Nodes: Thyroidal enlargement, likely related to goiter. No mediastinal hematoma. No enlarged mediastinal, hilar, or axillary lymph nodes. Esophagus normal. Lungs/Pleura: Tracheobronchial tree intact and patent. Lungs normally inflated. Minimal patchy ground-glass opacity within the posterior left upper lobe, suspicious for a small area of small airways disease/endobronchial infection (series 4, image 30). Lungs are otherwise clear. No focal infiltrates. No pulmonary edema or pleural effusion. No pneumothorax. No other worrisome pulmonary nodule or mass. Without focal Upper Abdomen: Visualized upper abdomen within normal limits. Musculoskeletal: Exaggeration of the normal thoracic kyphosis. No acute osseous abnormality. No worrisome lytic or blastic osseous lesions. Visualized external soft tissues demonstrate no acute abnormality. IMPRESSION: 1. No acute traumatic injury within the thoracic spine. 2. Small focus of nodular ground-glass opacity within the posterior left upper lobe, suspicious for a small area of acute endobronchial infection/small airways disease. 3. No other acute abnormality within the chest. 4. Extensive 3 vessel coronary artery calcifications. Electronically Signed   By: Jeannine Boga M.D.   On: 11/13/2017 06:38   Ct Cervical Spine Wo Contrast  Result Date: 11/13/2017 CLINICAL DATA:  Initial evaluation for acute headache, neck pain, recent fall. EXAM: CT HEAD WITHOUT CONTRAST CT CERVICAL SPINE WITHOUT CONTRAST TECHNIQUE: Multidetector CT imaging of the head and cervical spine was performed following the standard protocol without intravenous contrast. Multiplanar CT image reconstructions of the cervical spine were also  generated. COMPARISON:  Prior CT from 12/17/2014. FINDINGS: CT HEAD FINDINGS Brain: Atrophy with chronic small vessel ischemic disease. Remote left frontal and parietal infarcts, with additional small remote left cerebellar infarct. Dilated perivascular space at the left lentiform nucleus. No acute intracranial hemorrhage. No acute large vessel territory infarct. No mass lesion, midline shift or mass effect. No hydrocephalus. No extra-axial fluid collection. Vascular: No hyperdense vessel. Scattered vascular calcifications noted within the carotid siphons. Skull: Scalp soft tissues and calvarium within normal limits. Sinuses/Orbits: Globes and orbital soft tissues normal. Mild left sphenoid sinus disease. Paranasal sinuses and mastoid air cells are otherwise clear. Other: None. CT CERVICAL  SPINE FINDINGS Alignment: Straightening of the normal cervical lordosis. No listhesis or malalignment. Skull base and vertebrae: Skull base intact. Normal C1-2 articulations are preserved in the dens is intact. Vertebral body heights maintained. No acute fracture. Soft tissues and spinal canal: No acute soft tissue abnormality within the neck. Thyroid diffusely enlarged, like related to goiter. No prevertebral edema. Spinal canal within normal limits. 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 Upper chest: Visualized upper chest demonstrates no acute abnormality. Other: None. IMPRESSION: CT BRAIN: 1. No acute intracranial process. 2. Stable atrophy with moderate chronic small vessel ischemic disease with remote left frontal and parietal lobe infarcts, with additional small remote left cerebellar infarct. 3. Dilated perivascular space at the left lentiform nucleus. CT CERVICAL SPINE: 1. No acute traumatic injury within cervical spine. 2. 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 Electronically Signed   By: Jeannine Boga M.D.   On: 11/13/2017 06:30     Management plans discussed with the patient,  family and they are in agreement.  CODE STATUS:     Code Status Orders  (From admission, onward)        Start     Ordered   11/13/17 0804  Full code  Continuous     11/13/17 0803    Code Status History    Date Active Date Inactive Code Status Order ID Comments User Context   11/05/2017 1638 11/06/2017 2025 Full Code [id number]  SalaryAvel Peace, MD Inpatient   02/05/2017 0035 02/05/2017 2254 Full Code [id number]  Valinda Party, DO Inpatient   02/15/2016 0806 02/15/2016 2136 Full Code [id number]  Hillary Bow, MD Inpatient   02/14/2016 1728 02/15/2016 0747 Full Code [id number]  Dustin Flock, MD ED   06/16/2015 1753 06/19/2015 1822 Full Code [id number]  Clayburn Pert, MD Inpatient      TOTAL TIME TAKING CARE OF THIS PATIENT: 35 minutes.    Loletha Grayer M.D on 11/14/2017 at 1:46 PM  Between 7am to 6pm - Pager - [phone]  After 6pm go to [email or web address] - password [email or web address] [facility]  [phone]  CC: Primary care physician; [name], MD

## 2017-11-14 NOTE — Progress Notes (Signed)
Discharge instructions reviewed with patient and husband.  Understanding was verbalized and all questions were answered.  Patient verbalized she was not sure she would be able to afford the appt in the heart failure clinic as she did not have insurance.  She states that she has a heart failure doctor at [facility].  Advised patient she could see if any financial assistance is offered for hardship at heart failure clinic here; otherwise she should make an appt ASAP for CHF MD at [facility].  Both verbalized understanding.

## 2017-11-14 NOTE — Progress Notes (Signed)
Patient ID: Destiny [name], female    DOB: [birth year], 62 y.o.   MRN: [id number]  HPI  [name] is a 62 y/o female with a history of CAD, hyperlipidemia, HTN, stroke, obstructive sleep apnea (wears CPAP), hypokalemia, GERD and chronic heart failure.   Echo report from 02/05/17 reviewed and showed an EF of 55-60% along with mild MR.   Admitted 11/13/17 due to a fall. Potassium level was low. Had been taking metolazone and torsemide. Metolazone was stopped at discharge and potassium was supplemented. Discharged the following day. Admitted 11/05/17 due to hypokalemia. IV fluids were given along with replenishing potassium. Discharged the following day with instructions to stop metolazone & torsemide and use spironolactone. Was in the ED 10/11/17 due to left arm pain s/p a fall 2 weeks prior. Xray ruled out fracture. She was evaluated and released.   She presents today for her initial visit with a chief complaint of moderate shortness of breath upon minimal exertion. She describes this as being present for several months but has worsened over the last several weeks. She has associated fatigue, head congestion, pedal edema and chronic back pain along with this. She denies any difficulty sleeping, abdominal distention, palpitations, chest pain, dizziness or weight gain. Has had difficulty in maintaining her potassium level and is currently taking 19meq potassium daily (d/c summary says to take 31meq BID)  Past Medical History:  Diagnosis Date  . CAD (coronary artery disease)   . CVA (cerebral infarction)   . Female bladder prolapse   . GERD (gastroesophageal reflux disease)   . Hyperlipidemia   . Hypertension   . Mitral valve disease   . Sleep apnea   . Stroke [facility])    x3   Past Surgical History:  Procedure Laterality Date  . CARPAL TUNNEL RELEASE Bilateral   . CHOLECYSTECTOMY    . COLON SURGERY    . TENNIS ELBOW RELEASE/NIRSCHEL PROCEDURE Left   . TONSILLECTOMY     Family History   Problem Relation Age of Onset  . Heart disease Other   . Hypertension Other   . Rheum arthritis Mother   . CAD Father    Social History   Tobacco Use  . Smoking status: Never Smoker  . Smokeless tobacco: Never Used  Substance Use Topics  . Alcohol use: No    Alcohol/week: 0.0 oz   Allergies  Allergen Reactions  . Atorvastatin Hives    No s/sx of anaphylaxis, just intermittent hives.  Does not seem to be a class effect as she has tolerated lovastatin previously No s/sx of anaphylaxis, just intermittent hives.  Does not seem to be a class effect as she has tolerated lovastatin previously  . Fluconazole Rash    Reaction to generic but not name brand Reaction to generic but not name brand CAN USE DIFLUCAN.DOES NOT BREAK OUT WITH DIFLUCAN.(allergic only to generic brand)  . Sertraline Itching  . Sulfa Antibiotics Rash and Other (See Comments)    "hot " sensation  . Sulfamethoxazole-Trimethoprim Rash and Other (See Comments)    "felt hot" Other reaction(s): Other (See Comments) "felt hot"  . Sulfasalazine Other (See Comments) and Rash    "hot " sensation   Prior to Admission medications   Medication Sig Start Date End Date Taking? Authorizing Provider  acetaminophen (TYLENOL) 500 MG tablet Take 1,000 mg by mouth every 6 (six) hours as needed for headache.    Yes [provider]  aspirin EC 81 MG tablet Take 81 mg by  mouth every morning.    Yes [provider]  cetirizine (ZYRTEC) 10 MG tablet Take 1 tablet (10 mg total) by mouth daily. Patient taking differently: Take 10 mg by mouth daily as needed for allergies.  12/22/16  Yes Triplett, Cari B, FNP  Cholecalciferol (VITAMIN D3) 5000 UNITS CAPS Take 5,000 Units by mouth daily.   Yes [provider]  clonazePAM (KLONOPIN) 0.5 MG tablet Take 0.5 mg by mouth at bedtime as needed for anxiety.   Yes [provider]  conjugated estrogens (PREMARIN) vaginal cream Place 1 Applicatorful vaginally  daily. Mondays and Fridays   Yes [provider]  diphenhydrAMINE (BENADRYL) 25 mg capsule Take 25 mg by mouth every 4 (four) hours as needed.   Yes [provider]  Docusate Calcium (STOOL SOFTENER PO) Take 100 mg by mouth daily.    Yes [provider]  esomeprazole (NEXIUM) 40 MG capsule Take 40 mg by mouth 2 (two) times daily.   Yes [provider]  fluticasone (FLONASE) 50 MCG/ACT nasal spray Place 2 sprays into both nostrils daily as needed for allergies or rhinitis.   Yes [provider]  metoprolol succinate (TOPROL-XL) 25 MG 24 hr tablet Take 0.5 tablets (12.5 mg total) by mouth at bedtime. 11/14/17  Yes Wieting, Richard, MD  miconazole (MICOTIN) 2 % powder Apply 1 application topically as needed for itching.   Yes [provider]  nitroGLYCERIN (NITROSTAT) 0.4 MG SL tablet Place 0.4 mg under the tongue every 5 (five) minutes as needed for chest pain.   Yes [provider]  PARoxetine (PAXIL) 30 MG tablet Take 30 mg by mouth daily.   Yes [provider]  potassium chloride SA (K-DUR,KLOR-CON) 20 MEQ tablet Take 2 tablets (40 mEq total) by mouth 2 (two) times daily. 11/06/17  Yes Sudini, Alveta Heimlich, MD  pregabalin (LYRICA) 75 MG capsule Take 75 mg by mouth 3 (three) times daily. 1 AM + 1 at lunch + 2 at night   Yes [provider]  rosuvastatin (CRESTOR) 20 MG tablet Take 20 mg by mouth at bedtime.    Yes [provider]  spironolactone (ALDACTONE) 25 MG tablet Take 0.5 tablets (12.5 mg total) by mouth daily. 11/14/17 11/14/18 Yes Wieting, Richard, MD  ticagrelor (BRILINTA) 90 MG TABS tablet Take 90 mg by mouth 2 (two) times daily. 09/14/17  Yes [provider]  torsemide (DEMADEX) 20 MG tablet Take 1 tablet (20 mg total) by mouth daily. 11/14/17 11/14/18 Yes Wieting, Richard, MD  triamcinolone (KENALOG) 0.025 % ointment Apply 1 application topically 2 (two) times daily. Patient taking differently: Apply 1  application topically 2 (two) times daily. Patient reports doctor told her to use just Monday and Friday 02/01/17  Yes Laban Emperor, PA-C  traZODone (DESYREL) 50 MG tablet Take 50 mg by mouth at bedtime.    [provider]    Review of Systems  Constitutional: Positive for fatigue. Negative for appetite change.  HENT: Positive for congestion and rhinorrhea. Negative for sore throat.   Eyes: Negative.   Respiratory: Positive for shortness of breath (easily). Negative for chest tightness.   Cardiovascular: Positive for leg swelling. Negative for chest pain and palpitations.  Gastrointestinal: Negative for abdominal distention and abdominal pain.  Endocrine: Negative.   Genitourinary: Negative.   Musculoskeletal: Positive for back pain. Negative for neck pain.  Skin: Negative.   Allergic/Immunologic: Negative.   Neurological: Negative for dizziness and light-headedness.  Hematological: Negative for adenopathy. Does not bruise/bleed easily.  Psychiatric/Behavioral: Negative for dysphoric mood and sleep disturbance (wearing CPAP nightly). The patient is not nervous/anxious.     Vitals:   11/15/17 0914  BP: (!) 77/59  Pulse: 77  Resp: 18  SpO2: 100%  Weight: 221 lb 6 oz (100.4 kg)  Height: 5\' 2"  (1.575 m)   Wt Readings from Last 3 Encounters:  11/15/17 221 lb 6 oz (100.4 kg)  11/14/17 222 lb 9.6 oz (101 kg)  11/06/17 213 lb 6.4 oz (96.8 kg)   Lab Results  Component Value Date   CREATININE 1.21 (H) 11/14/2017   CREATININE 1.49 (H) 11/13/2017   CREATININE 1.49 (H) 11/13/2017   Physical Exam  Constitutional: She is oriented to person, place, and time. She appears well-developed and well-nourished.  HENT:  Head: Normocephalic and atraumatic.  Neck: Normal range of motion. Neck supple. No JVD present.  Cardiovascular: Normal rate and regular rhythm.  Pulmonary/Chest: Effort normal. She has no wheezes. She has no rales.  Abdominal: Soft. She exhibits no distension.   Musculoskeletal: She exhibits edema (trace pitting edema in right lower leg). She exhibits no tenderness.  Neurological: She is alert and oriented to person, place, and time.  Skin: Skin is warm and dry.  Psychiatric: She has a normal mood and affect. Her behavior is normal.  Nursing note and vitals reviewed.  Assessment & Plan:  1: Chronic heart failure with preserved ejection fraction- - NYHA class III - euvolemic today - not weighing daily and she was instructed to weigh every morning after using the bathroom and call for an overnight weight gain of >2 pounds or a weekly weight gain of >5 pounds - not adding salt and is trying to eat low sodium foods. Reviewed the importance of closely following a 2000mg  sodium diet and written dietary information was given to her about this - saw cardiology Luana Shu) 11/02/17 - scheduled for an echocardiogram at [facility] 11/22/17 - Pharm D reconciled medications with the patient  2: Hypokalemia- - potassium level <2.0 on 11/13/17 & 3.1 the following day - has been taking 52meq potassium daily instead of 41meq BID. She will start the increased dose today. - on spironolactone 12.5mg  daily right now - will get a BMP tomorrow per discharge summary and order was placed  3: HTN- - BP quite low (77/59) and was still low upon recheck (88/60) - will stop the metoprolol for now - may need to decrease torsemide - BMP on 11/14/17 reviewed and showed sodium 138, potassium 3.1 and GFR 47  Medication bottles were reviewed.  Return in 10 days for a recheck of BP or sooner for any questions/problems before then.

## 2017-11-15 ENCOUNTER — Ambulatory Visit: Payer: Self-pay | Attending: Family | Admitting: Family

## 2017-11-15 ENCOUNTER — Other Ambulatory Visit: Payer: Self-pay | Admitting: Family

## 2017-11-15 ENCOUNTER — Encounter: Payer: Self-pay | Admitting: Family

## 2017-11-15 VITALS — BP 88/60 | HR 77 | Resp 18 | Ht 62.0 in | Wt 221.4 lb

## 2017-11-15 DIAGNOSIS — Z8673 Personal history of transient ischemic attack (TIA), and cerebral infarction without residual deficits: Secondary | ICD-10-CM | POA: Insufficient documentation

## 2017-11-15 DIAGNOSIS — I2510 Atherosclerotic heart disease of native coronary artery without angina pectoris: Secondary | ICD-10-CM | POA: Insufficient documentation

## 2017-11-15 DIAGNOSIS — Z883 Allergy status to other anti-infective agents status: Secondary | ICD-10-CM | POA: Insufficient documentation

## 2017-11-15 DIAGNOSIS — Z9049 Acquired absence of other specified parts of digestive tract: Secondary | ICD-10-CM | POA: Insufficient documentation

## 2017-11-15 DIAGNOSIS — I5032 Chronic diastolic (congestive) heart failure: Secondary | ICD-10-CM | POA: Insufficient documentation

## 2017-11-15 DIAGNOSIS — E785 Hyperlipidemia, unspecified: Secondary | ICD-10-CM | POA: Insufficient documentation

## 2017-11-15 DIAGNOSIS — K219 Gastro-esophageal reflux disease without esophagitis: Secondary | ICD-10-CM | POA: Insufficient documentation

## 2017-11-15 DIAGNOSIS — Z79899 Other long term (current) drug therapy: Secondary | ICD-10-CM | POA: Insufficient documentation

## 2017-11-15 DIAGNOSIS — E876 Hypokalemia: Secondary | ICD-10-CM | POA: Insufficient documentation

## 2017-11-15 DIAGNOSIS — Z7982 Long term (current) use of aspirin: Secondary | ICD-10-CM | POA: Insufficient documentation

## 2017-11-15 DIAGNOSIS — I10 Essential (primary) hypertension: Secondary | ICD-10-CM

## 2017-11-15 DIAGNOSIS — I110 Hypertensive heart disease with heart failure: Secondary | ICD-10-CM | POA: Insufficient documentation

## 2017-11-15 DIAGNOSIS — I509 Heart failure, unspecified: Secondary | ICD-10-CM | POA: Insufficient documentation

## 2017-11-15 DIAGNOSIS — Z8249 Family history of ischemic heart disease and other diseases of the circulatory system: Secondary | ICD-10-CM | POA: Insufficient documentation

## 2017-11-15 DIAGNOSIS — Z9889 Other specified postprocedural states: Secondary | ICD-10-CM | POA: Insufficient documentation

## 2017-11-15 DIAGNOSIS — Z882 Allergy status to sulfonamides status: Secondary | ICD-10-CM | POA: Insufficient documentation

## 2017-11-15 NOTE — Patient Instructions (Addendum)
Begin weighing daily and call for an overnight weight gain of > 2 pounds or a weekly weight gain of >5 pounds.  Take 2 potassium tablets twice daily as per discharge summary from the hospital.  Stop taking the metoprolol for right now.

## 2017-11-16 ENCOUNTER — Telehealth: Payer: Self-pay | Admitting: Family

## 2017-11-16 ENCOUNTER — Encounter
Admission: RE | Admit: 2017-11-16 | Discharge: 2017-11-16 | Disposition: A | Discharge status: Home / Self Care | Payer: Self-pay | Source: Ambulatory Visit | Attending: Family | Admitting: Family

## 2017-11-16 DIAGNOSIS — Z01818 Encounter for other preprocedural examination: Secondary | ICD-10-CM | POA: Insufficient documentation

## 2017-11-16 DIAGNOSIS — E876 Hypokalemia: Secondary | ICD-10-CM | POA: Insufficient documentation

## 2017-11-16 LAB — BASIC METABOLIC PANEL
ANION GAP: 6 (ref 5–15)
BUN: 19 mg/dL (ref 6–20)
CO2: 27 mmol/L (ref 22–32)
Calcium: 9 mg/dL (ref 8.9–10.3)
Chloride: 107 mmol/L (ref 101–111)
Creatinine, Ser: 1.22 mg/dL — ABNORMAL HIGH (ref 0.44–1.00)
GFR calc Af Amer: 54 mL/min — ABNORMAL LOW (ref >60)
GFR, EST NON AFRICAN AMERICAN: 46 mL/min — AB (ref >60)
Glucose, Bld: 126 mg/dL — ABNORMAL HIGH (ref 65–99)
POTASSIUM: 4.4 mmol/L (ref 3.5–5.1)
Sodium: 140 mmol/L (ref 135–145)

## 2017-11-16 NOTE — Discharge Summary (Signed)
[facility] NAME: Destiny [name]    MR#:  [id number]  DATE OF BIRTH:  06-Dec-1955  DATE OF ADMISSION:  11/05/2017 ADMITTING PHYSICIAN: Destiny Harms, MD  DATE OF DISCHARGE: 11/06/2017  5:20 PM  PRIMARY CARE PHYSICIAN: Destiny Lank, MD   ADMISSION DIAGNOSIS:  Hypokalemia [E87.6]  DISCHARGE DIAGNOSIS:  Active Problems:   Hypokalemia   SECONDARY DIAGNOSIS:   Past Medical History:  Diagnosis Date  . CAD (coronary artery disease)   . CHF (congestive heart failure) ([place])   . CVA (cerebral infarction)   . Female bladder prolapse   . GERD (gastroesophageal reflux disease)   . Hyperlipidemia   . Hypertension   . Hypokalemia   . Mitral valve disease   . Sleep apnea   . Stroke [facility])    x3     ADMITTING HISTORY  HISTORY OF PRESENT ILLNESS: Destiny [name]  is a 62 y.o. female with a known history per below presents emergency room with leg cramps for Destiny last several days typically caused by low potassium, [name] previously changed from Lasix to torsemide/spironolactone to help remedy Destiny situation, in Destiny emergency room [name] was found to have potassium 2.5, creatinine 1.7 with baseline normal, chloride 91, [name] evaluated at Destiny bedside, in no apparent distress, husband is present, [name] without any other complaints, [name] is now being admitted for acute diffuse muscular spasms secondary to acute recurrent hypokalemia secondary to diuretic side effect, acute kidney injury secondary to diuretic side effect.  HOSPITAL COURSE:   *Severe hypokalemia due to diuretic use *Acute kidney injury *Chronic diastolic congestive heart failure *CAD  [name] was admitted to medical floor with telemetry monitoring.  Her potassium was replaced.  Diuretics held.  Started on IV fluids.  By Destiny day of discharge her potassium is improved.  Her torsemide dose is being reduced due to acute kidney injury and hypokalemia.  Potassium supplement  prescriptions given.  She will have a repeat potassium check in 4 to 5 days with her primary care physician.  Stable for discharge home.  CONSULTS OBTAINED:    DRUG ALLERGIES:   Allergies  Allergen Reactions  . Atorvastatin Hives    No s/sx of anaphylaxis, just intermittent hives.  Does not seem to be a class effect as she has tolerated lovastatin previously No s/sx of anaphylaxis, just intermittent hives.  Does not seem to be a class effect as she has tolerated lovastatin previously  . Fluconazole Rash    Reaction to generic but not name brand Reaction to generic but not name brand CAN USE DIFLUCAN.DOES NOT BREAK OUT WITH DIFLUCAN.(allergic only to generic brand)  . Sertraline Itching  . Sulfa Antibiotics Rash and Other (See Comments)    "hot " sensation  . Sulfamethoxazole-Trimethoprim Rash and Other (See Comments)    "felt hot" Other reaction(s): Other (See Comments) "felt hot"  . Sulfasalazine Other (See Comments) and Rash    "hot " sensation    DISCHARGE MEDICATIONS:   Allergies as of 11/06/2017      Reactions   Atorvastatin Hives   No s/sx of anaphylaxis, just intermittent hives.  Does not seem to be a class effect as she has tolerated lovastatin previously No s/sx of anaphylaxis, just intermittent hives.  Does not seem to be a class effect as she has tolerated lovastatin previously   Fluconazole Rash   Reaction to generic but not name brand Reaction to generic but not name brand CAN USE DIFLUCAN.DOES NOT  BREAK OUT WITH DIFLUCAN.(allergic only to generic brand)   Sertraline Itching   Sulfa Antibiotics Rash, Other (See Comments)   "hot " sensation   Sulfamethoxazole-trimethoprim Rash, Other (See Comments)   "felt hot" Other reaction(s): Other (See Comments) "felt hot"   Sulfasalazine Other (See Comments), Rash   "hot " sensation      Medication List    STOP taking these medications   torsemide 20 MG tablet Commonly known as:  DEMADEX     TAKE these  medications   acetaminophen 500 MG tablet Commonly known as:  TYLENOL Take 1,000 mg by mouth every 6 (six) hours as needed for headache.   aspirin EC 81 MG tablet Take 81 mg by mouth every morning.   BRILINTA 90 MG Tabs tablet Generic drug:  ticagrelor Take 90 mg by mouth 2 (two) times daily.   cetirizine 10 MG tablet Commonly known as:  ZYRTEC Take 1 tablet (10 mg total) by mouth daily. What changed:    when to take this  reasons to take this   esomeprazole 40 MG capsule Commonly known as:  NEXIUM Take 40 mg by mouth 2 (two) times daily.   nitroGLYCERIN 0.4 MG SL tablet Commonly known as:  NITROSTAT Place 0.4 mg under Destiny tongue every 5 (five) minutes as needed for chest pain.   PARoxetine 30 MG tablet Commonly known as:  PAXIL Take 30 mg by mouth daily.   potassium chloride SA 20 MEQ tablet Commonly known as:  K-DUR,KLOR-CON Take 2 tablets (40 mEq total) by mouth 2 (two) times daily. What changed:  how much to take   rosuvastatin 20 MG tablet Commonly known as:  CRESTOR Take 20 mg by mouth at bedtime.   STOOL SOFTENER PO Take 100 mg by mouth daily.   traZODone 50 MG tablet Commonly known as:  DESYREL Take 50 mg by mouth at bedtime.   triamcinolone 0.025 % ointment Commonly known as:  KENALOG Apply 1 application topically 2 (two) times daily. What changed:  additional instructions   Vitamin D3 5000 units Caps Take 5,000 Units by mouth daily.       Today   VITAL SIGNS:  Blood pressure 103/68, pulse 70, temperature 97.6 F (36.4 C), resp. rate 18, height 5\' 2"  (1.575 m), weight 96.8 kg (213 lb 6.4 oz), SpO2 100 %.  I/O:  No intake or output data in Destiny 24 hours ending 11/16/17 1152  PHYSICAL EXAMINATION:  Physical Exam  GENERAL:  62 y.o.-year-old [name] lying in Destiny bed with no acute distress.  LUNGS: Normal breath sounds bilaterally, no wheezing, rales,rhonchi or crepitation. No use of accessory muscles of respiration.  CARDIOVASCULAR: S1, S2  normal. No murmurs, rubs, or gallops.  ABDOMEN: Soft, non-tender, non-distended. Bowel sounds present. No organomegaly or mass.  NEUROLOGIC: Moves all 4 extremities. PSYCHIATRIC: Destiny [name] is alert and oriented x 3.  SKIN: No obvious rash, lesion, or ulcer.   DATA REVIEW:   CBC Recent Labs  Lab 11/14/17 0503  WBC 6.7  HGB 10.0*  HCT 29.0*  PLT 170    Chemistries  Recent Labs  Lab 11/13/17 1428  11/16/17 0914  NA 133*   < > 140  K 3.0*   < > 4.4  CL 91*   < > 107  CO2 32   < > 27  GLUCOSE 151*   < > 126*  BUN 51*   < > 19  CREATININE 1.49*   < > 1.22*  CALCIUM 8.7*   < >  9.0  MG 2.3  --   --    < > = values in this interval not displayed.    Cardiac Enzymes No results for input(s): TROPONINI in Destiny last 168 hours.  Microbiology Results  Results for orders placed or performed during Destiny hospital encounter of 02/11/17  Urine culture     Status: Abnormal   Collection Time: 02/11/17  8:23 AM  Result Value Ref Range Status   Specimen Description URINE, CLEAN CATCH  Final   Special Requests Normal  Final   Culture MULTIPLE SPECIES PRESENT, SUGGEST RECOLLECTION (A)  Final   Report Status 02/13/2017 FINAL  Final    RADIOLOGY:  No results found.  Follow up with PCP in 1 week.  Management plans discussed with Destiny [name], family and they are in agreement.  CODE STATUS:  Code Status History    Date Active Date Inactive Code Status Order ID Comments User Context   11/13/2017 0804 11/14/2017 1819 Full Code [id number]  Loletha Grayer, MD ED   11/05/2017 1638 11/06/2017 2025 Full Code [id number]  Salary, Avel Peace, MD Inpatient   02/05/2017 0035 02/05/2017 2254 Full Code [id number]  Valinda Party, DO Inpatient   02/15/2016 0806 02/15/2016 2136 Full Code [id number]  Hillary Bow, MD Inpatient   02/14/2016 1728 02/15/2016 0747 Full Code [id number]  Dustin Flock, MD ED   06/16/2015 1753 06/19/2015 1822 Full Code [id number]  Clayburn Pert, MD Inpatient      TOTAL  TIME TAKING CARE OF THIS [name] ON DAY OF DISCHARGE: more than 30 minutes.   Leia Alf Charnell Peplinski M.D on 11/16/2017 at 11:52 AM  Between 7am to 6pm - Pager - [phone]  After 6pm go to [email or web address] - password [email or web address] [facility]  Office  [phone]  CC: Primary care physician; Destiny Lank, MD  Note: This dictation was prepared with Dragon dictation along with smaller phrase technology. Any transcriptional errors that result from this process are unintentional.

## 2017-11-16 NOTE — Telephone Encounter (Signed)
Spoke with patient regarding lab results that were obtained today (11/16/17). Potassium has improved to 4.4 (was 3.1 two days prior) and she has been taking 20meq potassium twice daily along with 12.5mg  spironolactone. Advised patient to decrease her potassium to 79meq BID and will recheck labs at her next visit on 11/24/17.   Patient verbalized understanding.

## 2017-11-22 HISTORY — PX: CATARACT EXTRACTION: SUR2

## 2017-11-23 NOTE — Progress Notes (Signed)
Patient ID: Destiny [name], female    DOB: 10-Jun-1956, 62 y.o.   MRN: [id number]  HPI  Destiny [name] is a 62 y/o female with a history of CAD, hyperlipidemia, HTN, stroke, obstructive sleep apnea (wears CPAP), hypokalemia, GERD and chronic heart failure.   Echo report from 11/23/17 reviewed and showed an EF of 60-65% along with mild/moderate MR. Echo report from 02/05/17 reviewed and showed an EF of 55-60% along with mild MR.   Admitted 11/13/17 due to a fall. Potassium level was low. Had been taking metolazone and torsemide. Metolazone was stopped at discharge and potassium was supplemented. Discharged the following day. Admitted 11/05/17 due to hypokalemia. IV fluids were given along with replenishing potassium. Discharged the following day with instructions to stop metolazone & torsemide and use spironolactone. Was in the ED 10/11/17 due to left arm pain s/p a fall 2 weeks prior. Xray ruled out fracture. Destiny [name] was evaluated and released.   Destiny [name] presents today for a follow-up visit with a chief complaint of minimal shortness of breath upon moderate exertion. Destiny [name] says that her breathing has improved recently. Destiny [name] has associated fatigue, light-headedness and back pain along with this. Destiny [name] denies any difficulty sleeping, abdominal distention, palpitations, edema, chest pain or weight gain. Destiny [name] has recently had a right cataract removed. Had recent lab work done at her PCP's office 11/17/17.   Past Medical History:  Diagnosis Date  . CAD (coronary artery disease)   . CHF (congestive heart failure) ([place])   . CVA (cerebral infarction)   . Female bladder prolapse   . GERD (gastroesophageal reflux disease)   . Hyperlipidemia   . Hypertension   . Hypokalemia   . Mitral valve disease   . Sleep apnea   . Stroke [facility])    x3   Past Surgical History:  Procedure Laterality Date  . CARPAL TUNNEL RELEASE Bilateral   . CHOLECYSTECTOMY    . COLON SURGERY    . TENNIS ELBOW RELEASE/NIRSCHEL PROCEDURE Left   .  TONSILLECTOMY     Family History  Problem Relation Age of Onset  . Heart disease Other   . Hypertension Other   . Rheum arthritis Mother   . CAD Father    Social History   Tobacco Use  . Smoking status: Never Smoker  . Smokeless tobacco: Never Used  Substance Use Topics  . Alcohol use: No    Alcohol/week: 0.0 oz   Allergies  Allergen Reactions  . Atorvastatin Hives    No s/sx of anaphylaxis, just intermittent hives.  Does not seem to be a class effect as Destiny [name] has tolerated lovastatin previously No s/sx of anaphylaxis, just intermittent hives.  Does not seem to be a class effect as Destiny [name] has tolerated lovastatin previously  . Fluconazole Rash    Reaction to generic but not name brand Reaction to generic but not name brand CAN USE DIFLUCAN.DOES NOT BREAK OUT WITH DIFLUCAN.(allergic only to generic brand)  . Sertraline Itching  . Sulfa Antibiotics Rash and Other (See Comments)    "hot " sensation  . Sulfamethoxazole-Trimethoprim Rash and Other (See Comments)    "felt hot" Other reaction(s): Other (See Comments) "felt hot"  . Sulfasalazine Other (See Comments) and Rash    "hot " sensation   Prior to Admission medications   Medication Sig Start Date End Date Taking? Authorizing Provider  acetaminophen (TYLENOL) 500 MG tablet Take 1,000 mg by mouth every 6 (six) hours as needed for headache.    Yes [provider]  aspirin EC 81 MG tablet Take 81 mg by mouth every morning.    Yes [provider]  cetirizine (ZYRTEC) 10 MG tablet Take 1 tablet (10 mg total) by mouth daily. Patient taking differently: Take 10 mg by mouth daily as needed for allergies.  12/22/16  Yes Triplett, Cari B, FNP  Cholecalciferol (VITAMIN D3) 5000 UNITS CAPS Take 5,000 Units by mouth daily.   Yes [provider]  clonazePAM (KLONOPIN) 0.5 MG tablet Take 0.5 mg by mouth at bedtime as needed for anxiety.   Yes [provider]  conjugated estrogens (PREMARIN) vaginal cream  Place 1 Applicatorful vaginally daily. Mondays and Fridays   Yes [provider]  diphenhydrAMINE (BENADRYL) 25 mg capsule Take 25 mg by mouth every 4 (four) hours as needed.   Yes [provider]  Docusate Calcium (STOOL SOFTENER PO) Take 100 mg by mouth daily.    Yes [provider]  esomeprazole (NEXIUM) 40 MG capsule Take 40 mg by mouth 2 (two) times daily.   Yes [provider]  miconazole (MICOTIN) 2 % powder Apply 1 application topically as needed for itching.   Yes [provider]  nitroGLYCERIN (NITROSTAT) 0.4 MG SL tablet Place 0.4 mg under the tongue every 5 (five) minutes as needed for chest pain.   Yes [provider]  PARoxetine (PAXIL) 30 MG tablet Take 30 mg by mouth daily.   Yes [provider]  potassium chloride SA (K-DUR,KLOR-CON) 20 MEQ tablet Take 2 tablets (40 mEq total) by mouth 2 (two) times daily. Patient taking differently: Take 40 mEq by mouth 2 (two) times daily. Pt taking 20 MEQ daily 11/06/17  Yes Sudini, Alveta Heimlich, MD  pregabalin (LYRICA) 75 MG capsule Take 75 mg by mouth 3 (three) times daily. 2 am and 2 PM   Yes [provider]  rosuvastatin (CRESTOR) 20 MG tablet Take 20 mg by mouth at bedtime.    Yes [provider]  spironolactone (ALDACTONE) 25 MG tablet Take 0.5 tablets (12.5 mg total) by mouth daily. 11/14/17 11/14/18 Yes Wieting, Richard, MD  ticagrelor (BRILINTA) 90 MG TABS tablet Take 90 mg by mouth 2 (two) times daily. 09/14/17  Yes [provider]  torsemide (DEMADEX) 20 MG tablet Take 1 tablet (20 mg total) by mouth daily. 11/14/17 11/14/18 Yes Wieting, Richard, MD  fluticasone (FLONASE) 50 MCG/ACT nasal spray Place 2 sprays into both nostrils daily as needed for allergies or rhinitis.    [provider]  traZODone (DESYREL) 50 MG tablet Take 50 mg by mouth at bedtime.    [provider]  triamcinolone (KENALOG) 0.025 % ointment Apply 1 application topically  2 (two) times daily. Patient not taking: Reported on 11/24/2017 02/01/17   Laban Emperor, PA-C    Review of Systems  Constitutional: Positive for fatigue. Negative for appetite change.  HENT: Positive for congestion and rhinorrhea. Negative for sore throat.   Eyes: Negative.   Respiratory: Positive for shortness of breath ("better"). Negative for chest tightness.   Cardiovascular: Negative for chest pain, palpitations and leg swelling.  Gastrointestinal: Negative for abdominal distention and abdominal pain.  Endocrine: Negative.   Genitourinary: Negative.   Musculoskeletal: Positive for back pain. Negative for neck pain.  Skin: Negative.   Allergic/Immunologic: Negative.   Neurological: Positive for light-headedness. Negative for dizziness.  Hematological: Negative for adenopathy. Does not bruise/bleed easily.  Psychiatric/Behavioral: Negative for dysphoric mood and sleep disturbance (wearing CPAP nightly). The patient is not nervous/anxious.  Vitals:   11/24/17 1024  BP: 120/86  Pulse: 85  Resp: 18  SpO2: 99%  Weight: 219 lb 2 oz (99.4 kg)  Height: 5\' 2"  (1.575 m)   Wt Readings from Last 3 Encounters:  11/24/17 219 lb 2 oz (99.4 kg)  11/15/17 221 lb 6 oz (100.4 kg)  11/14/17 222 lb 9.6 oz (101 kg)   Lab Results  Component Value Date   CREATININE 1.22 (H) 11/16/2017   CREATININE 1.21 (H) 11/14/2017   CREATININE 1.49 (H) 11/13/2017    Physical Exam  Constitutional: Destiny [name] is oriented to person, place, and time. Destiny [name] appears well-developed and well-nourished.  HENT:  Head: Normocephalic and atraumatic.  Neck: Normal range of motion. Neck supple. No JVD present.  Cardiovascular: Normal rate and regular rhythm.  Pulmonary/Chest: Effort normal. Destiny [name] has no wheezes. Destiny [name] has no rales.  Abdominal: Soft. Destiny [name] exhibits no distension.  Musculoskeletal: Destiny [name] exhibits no edema or tenderness.       Right lower leg: Destiny [name] exhibits no edema.       Left lower leg: Destiny [name] exhibits no edema.   Neurological: Destiny [name] is alert and oriented to person, place, and time.  Skin: Skin is warm and dry.  Psychiatric: Destiny [name] has a normal mood and affect. Her behavior is normal.  Nursing note and vitals reviewed.  Assessment & Plan:  1: Chronic heart failure with preserved ejection fraction- - NYHA class II - euvolemic today - weighing daily and Destiny [name] was reminded to call for an overnight weight gain of >2 pounds or a weekly weight gain of >5 pounds; home weight chart reviewed and Destiny [name] hasn't had any weight gains per above parameters - weight down 2 pounds from last time Destiny [name] was here - not adding salt and is trying to eat low sodium foods. Reviewed the importance of closely following a 2000mg  sodium diet - saw cardiology Gennette Pac) 11/21/17  - patient has upcoming appointment with [facility] pulmonology  - Pharm D reconciled medications with the patient  2: Hypokalemia- - potassium level has been improving - taking 64meq potassium QD  - on spironolactone 12.5mg  daily right now  3: HTN- - BP looks better today - metoprolol stopped at last visit - BMP on 11/17/17 reviewed and showed sodium 140, potassium 4.7 and GFR 70  Medication bottles were reviewed.  Return in 3 months or sooner for any questions/problems before then.

## 2017-11-24 ENCOUNTER — Encounter: Payer: Self-pay | Admitting: Family Medicine

## 2017-11-24 ENCOUNTER — Ambulatory Visit: Payer: Self-pay | Attending: Family | Admitting: Family

## 2017-11-24 ENCOUNTER — Encounter: Payer: Self-pay | Admitting: Family

## 2017-11-24 VITALS — BP 120/86 | HR 85 | Resp 18 | Ht 62.0 in | Wt 219.1 lb

## 2017-11-24 DIAGNOSIS — I5032 Chronic diastolic (congestive) heart failure: Secondary | ICD-10-CM

## 2017-11-24 DIAGNOSIS — Z79899 Other long term (current) drug therapy: Secondary | ICD-10-CM | POA: Insufficient documentation

## 2017-11-24 DIAGNOSIS — I110 Hypertensive heart disease with heart failure: Secondary | ICD-10-CM | POA: Insufficient documentation

## 2017-11-24 DIAGNOSIS — Z7982 Long term (current) use of aspirin: Secondary | ICD-10-CM | POA: Insufficient documentation

## 2017-11-24 DIAGNOSIS — E785 Hyperlipidemia, unspecified: Secondary | ICD-10-CM | POA: Insufficient documentation

## 2017-11-24 DIAGNOSIS — Z883 Allergy status to other anti-infective agents status: Secondary | ICD-10-CM | POA: Insufficient documentation

## 2017-11-24 DIAGNOSIS — Z882 Allergy status to sulfonamides status: Secondary | ICD-10-CM | POA: Insufficient documentation

## 2017-11-24 DIAGNOSIS — I2510 Atherosclerotic heart disease of native coronary artery without angina pectoris: Secondary | ICD-10-CM | POA: Insufficient documentation

## 2017-11-24 DIAGNOSIS — G4733 Obstructive sleep apnea (adult) (pediatric): Secondary | ICD-10-CM | POA: Insufficient documentation

## 2017-11-24 DIAGNOSIS — I509 Heart failure, unspecified: Secondary | ICD-10-CM | POA: Insufficient documentation

## 2017-11-24 DIAGNOSIS — E876 Hypokalemia: Secondary | ICD-10-CM | POA: Insufficient documentation

## 2017-11-24 DIAGNOSIS — I10 Essential (primary) hypertension: Secondary | ICD-10-CM

## 2017-11-24 DIAGNOSIS — K219 Gastro-esophageal reflux disease without esophagitis: Secondary | ICD-10-CM | POA: Insufficient documentation

## 2017-11-24 DIAGNOSIS — Z8673 Personal history of transient ischemic attack (TIA), and cerebral infarction without residual deficits: Secondary | ICD-10-CM | POA: Insufficient documentation

## 2017-11-24 NOTE — Patient Instructions (Signed)
Continue weighing daily and call for an overnight weight gain of > 2 pounds or a weekly weight gain of >5 pounds. 

## 2018-01-28 ENCOUNTER — Emergency Department
Admission: EM | Admit: 2018-01-28 | Discharge: 2018-01-28 | Disposition: A | Discharge status: Home / Self Care | Payer: Self-pay | Attending: Emergency Medicine | Admitting: Emergency Medicine

## 2018-01-28 ENCOUNTER — Other Ambulatory Visit: Payer: Self-pay

## 2018-01-28 ENCOUNTER — Emergency Department: Payer: Self-pay

## 2018-01-28 DIAGNOSIS — R600 Localized edema: Secondary | ICD-10-CM | POA: Insufficient documentation

## 2018-01-28 DIAGNOSIS — Z7982 Long term (current) use of aspirin: Secondary | ICD-10-CM | POA: Insufficient documentation

## 2018-01-28 DIAGNOSIS — I110 Hypertensive heart disease with heart failure: Secondary | ICD-10-CM | POA: Insufficient documentation

## 2018-01-28 DIAGNOSIS — M25561 Pain in right knee: Secondary | ICD-10-CM | POA: Insufficient documentation

## 2018-01-28 DIAGNOSIS — Z79899 Other long term (current) drug therapy: Secondary | ICD-10-CM | POA: Insufficient documentation

## 2018-01-28 DIAGNOSIS — I2510 Atherosclerotic heart disease of native coronary artery without angina pectoris: Secondary | ICD-10-CM | POA: Insufficient documentation

## 2018-01-28 DIAGNOSIS — I5032 Chronic diastolic (congestive) heart failure: Secondary | ICD-10-CM | POA: Insufficient documentation

## 2018-01-28 DIAGNOSIS — Z8673 Personal history of transient ischemic attack (TIA), and cerebral infarction without residual deficits: Secondary | ICD-10-CM | POA: Insufficient documentation

## 2018-01-28 NOTE — Discharge Instructions (Signed)

## 2018-01-28 NOTE — ED Notes (Signed)
Ace wrap applide to right knee

## 2018-01-28 NOTE — ED Provider Notes (Signed)
[facility] Emergency Department Provider Note  ____________________________________________   First MD Initiated Contact with Patient 01/28/18 1604     (approximate)  I have reviewed the triage vital signs and the nursing notes.   HISTORY  Chief Complaint Leg Pain    HPI Destiny [name] is a 62 y.o. female with chronic medical issues as listed below who presents for evaluation of right knee pain.  She reports the pain started gradually about 3 days ago and has steadily gotten worse.  She had no traumatic injury and does not remember straining it or otherwise injuring it.  She is on blood thinners because of coronary artery stents and has never had blood clots in her legs or her lungs.  She goes to [facility] for most of her medical care including orthopedic care.  She reports that the pain is worse with weightbearing and ambulation.  Nothing particular makes it better other than rest.  There is been no swelling.  She thinks that there is been some redness of her lower extremity.  She denies fever/chills, chest pain, shortness of breath, nausea, vomiting, and abdominal pain.  She describes it as an aching pain behind her right knee.  Past Medical History:  Diagnosis Date  . CAD (coronary artery disease)   . CHF (congestive heart failure) ([place])   . CVA (cerebral infarction)   . Female bladder prolapse   . GERD (gastroesophageal reflux disease)   . Hyperlipidemia   . Hypertension   . Hypokalemia   . Mitral valve disease   . Sleep apnea   . Stroke [facility])    x3    Patient Active Problem List   Diagnosis Date Noted  . Chronic diastolic heart failure ([place]) 11/15/2017  . HTN (hypertension) 11/15/2017  . Hypokalemia 11/05/2017  . Chest pain 02/14/2016  . Small bowel obstruction ([place]) 06/16/2015    Past Surgical History:  Procedure Laterality Date  . CARPAL TUNNEL RELEASE Bilateral   . CATARACT EXTRACTION Right 11/22/2017  . CHOLECYSTECTOMY    . COLON  SURGERY    . TENNIS ELBOW RELEASE/NIRSCHEL PROCEDURE Left   . TONSILLECTOMY      Prior to Admission medications   Medication Sig Start Date End Date Taking? Authorizing Provider  acetaminophen (TYLENOL) 500 MG tablet Take 1,000 mg by mouth every 6 (six) hours as needed for headache.     [provider]  aspirin EC 81 MG tablet Take 81 mg by mouth every morning.     [provider]  cetirizine (ZYRTEC) 10 MG tablet Take 1 tablet (10 mg total) by mouth daily. Patient taking differently: Take 10 mg by mouth daily as needed for allergies.  12/22/16   Triplett, Johnette Abraham B, FNP  Cholecalciferol (VITAMIN D3) 5000 UNITS CAPS Take 5,000 Units by mouth daily.    [provider]  clonazePAM (KLONOPIN) 0.5 MG tablet Take 0.5 mg by mouth at bedtime as needed for anxiety.    [provider]  conjugated estrogens (PREMARIN) vaginal cream Place 1 Applicatorful vaginally daily. Mondays and Fridays    [provider]  diphenhydrAMINE (BENADRYL) 25 mg capsule Take 25 mg by mouth every 4 (four) hours as needed.    [provider]  Docusate Calcium (STOOL SOFTENER PO) Take 100 mg by mouth daily.     [provider]  esomeprazole (NEXIUM) 40 MG capsule Take 40 mg by mouth 2 (two) times daily.    [provider]  fluticasone (FLONASE) 50 MCG/ACT nasal  spray Place 2 sprays into both nostrils daily as needed for allergies or rhinitis.    [provider]  miconazole (MICOTIN) 2 % powder Apply 1 application topically as needed for itching.    [provider]  nitroGLYCERIN (NITROSTAT) 0.4 MG SL tablet Place 0.4 mg under the tongue every 5 (five) minutes as needed for chest pain.    [provider]  PARoxetine (PAXIL) 30 MG tablet Take 30 mg by mouth daily.    [provider]  potassium chloride SA (K-DUR,KLOR-CON) 20 MEQ tablet Take 2 tablets (40 mEq total) by mouth 2 (two) times daily. Patient taking differently: Take  40 mEq by mouth 2 (two) times daily. Pt taking 20 MEQ daily 11/06/17   Hillary Bow, MD  pregabalin (LYRICA) 75 MG capsule Take 75 mg by mouth 3 (three) times daily. 2 am and 2 PM    [provider]  rosuvastatin (CRESTOR) 20 MG tablet Take 20 mg by mouth at bedtime.     [provider]  spironolactone (ALDACTONE) 25 MG tablet Take 0.5 tablets (12.5 mg total) by mouth daily. 11/14/17 11/14/18  Loletha Grayer, MD  ticagrelor (BRILINTA) 90 MG TABS tablet Take 90 mg by mouth 2 (two) times daily. 09/14/17   [provider]  torsemide (DEMADEX) 20 MG tablet Take 1 tablet (20 mg total) by mouth daily. 11/14/17 11/14/18  Loletha Grayer, MD  traZODone (DESYREL) 50 MG tablet Take 50 mg by mouth at bedtime.    [provider]  triamcinolone (KENALOG) 0.025 % ointment Apply 1 application topically 2 (two) times daily. Patient not taking: Reported on 11/24/2017 02/01/17   Laban Emperor, PA-C    Allergies Atorvastatin; Fluconazole; Sertraline; Sulfa antibiotics; Sulfamethoxazole-trimethoprim; and Sulfasalazine  Family History  Problem Relation Age of Onset  . Heart disease Other   . Hypertension Other   . Rheum arthritis Mother   . CAD Father     Social History Social History   Tobacco Use  . Smoking status: Never Smoker  . Smokeless tobacco: Never Used  Substance Use Topics  . Alcohol use: No    Alcohol/week: 0.0 oz  . Drug use: No    Review of Systems Constitutional: No fever/chills Eyes: No visual changes. ENT: No sore throat. Cardiovascular: Denies chest pain. Respiratory: Denies shortness of breath. Gastrointestinal: No abdominal pain.  No nausea, no vomiting.  No diarrhea.  No constipation. Genitourinary: Negative for dysuria. Musculoskeletal: Pain in the right knee worse with weightbearing and flexion as described above.  No back pain. Integumentary: Negative for rash. Neurological: Negative for headaches, focal weakness or  numbness.   ____________________________________________   PHYSICAL EXAM:  VITAL SIGNS: ED Triage Vitals  Enc Vitals Group     BP 01/28/18 1321 100/63     Pulse Rate 01/28/18 1321 78     Resp 01/28/18 1321 14     Temp 01/28/18 1321 98.3 F (36.8 C)     Temp Source 01/28/18 1321 Oral     SpO2 01/28/18 1321 98 %     Weight 01/28/18 1321 102.1 kg (225 lb)     Height 01/28/18 1321 1.575 m (5\' 2" )     Head Circumference --      Peak Flow --      Pain Score 01/28/18 1327 9     Pain Loc --      Pain Edu? --      Excl. in [place]? --     Constitutional: Alert and oriented. Well appearing and  in no acute distress. Eyes: Conjunctivae are normal.  Head: Atraumatic. Nose: No congestion/rhinnorhea. Mouth/Throat: Mucous membranes are moist. Neck: No stridor.  No meningeal signs.   Cardiovascular: Normal rate, regular rhythm. Good peripheral circulation. Grossly normal heart sounds. Respiratory: Normal respiratory effort.  No retractions. Lungs CTAB. Gastrointestinal: Morbid obesity.  Soft and nontender. No distention.  Musculoskeletal: No lower extremity tenderness nor edema. No gross deformities of extremities.  No signs of cellulitis.  No tenderness to palpation of the right knee.  No effusion.  No tenderness in the popliteal fossa.  No tenderness with flexion extension of the knee. Neurologic:  Normal speech and language. No gross focal neurologic deficits are appreciated.  Skin:  Skin is warm, dry and intact. No rash noted. Psychiatric: Mood and affect are normal. Speech and behavior are normal.  ____________________________________________   LABS (all labs ordered are listed, but only abnormal results are displayed)  Labs Reviewed - No data to display ____________________________________________  EKG  None - EKG not ordered by ED physician ____________________________________________  RADIOLOGY   ED MD interpretation: No evidence of DVT  Official radiology report(s): US  Venous Img Lower Unilateral Right  Result Date: 01/28/2018 CLINICAL DATA:  Right lower extremity pain and swelling for 3 days. EXAM: RIGHT LOWER EXTREMITY VENOUS DOPPLER ULTRASOUND TECHNIQUE: Gray-scale sonography with graded compression, as well as color Doppler and duplex ultrasound were performed to evaluate the lower extremity deep venous systems from the level of the common femoral vein and including the common femoral, femoral, profunda femoral, popliteal and calf veins including the posterior tibial, peroneal and gastrocnemius veins when visible. The superficial great saphenous vein was also interrogated. Spectral Doppler was utilized to evaluate flow at rest and with distal augmentation maneuvers in the common femoral, femoral and popliteal veins. COMPARISON:  None. FINDINGS: Contralateral Common Femoral Vein: Respiratory phasicity is normal and symmetric with the symptomatic side. No evidence of thrombus. Normal compressibility. Common Femoral Vein: No evidence of thrombus. Normal compressibility, respiratory phasicity and response to augmentation. Saphenofemoral Junction: No evidence of thrombus. Normal compressibility and flow on color Doppler imaging. Profunda Femoral Vein: No evidence of thrombus. Normal compressibility and flow on color Doppler imaging. Femoral Vein: No evidence of thrombus. Normal compressibility, respiratory phasicity and response to augmentation. Popliteal Vein: No evidence of thrombus. Normal compressibility, respiratory phasicity and response to augmentation. Calf Veins: No evidence of thrombus. Normal compressibility and flow on color Doppler imaging. Superficial Great Saphenous Vein: No evidence of thrombus. Normal compressibility. Venous Reflux:  None. Other Findings:  None. IMPRESSION: No evidence of deep venous thrombosis. Electronically Signed   By: Lajean Manes M.D.   On: 01/28/2018 15:43    ____________________________________________   PROCEDURES  Critical Care  performed: No   Procedure(s) performed:   Procedures   ____________________________________________   INITIAL IMPRESSION / ASSESSMENT AND PLAN / ED COURSE  As part of my medical decision making, I reviewed the following data within the [place] notes reviewed and incorporated, Old chart reviewed and Notes from prior ED visits    Differential diagnosis includes, but is not limited to, musculoskeletal pain/strain, contusion, DVT, Baker's cyst, osteoarthritis, septic joint, cellulitis.  There is no evidence of DVT or Baker's cyst on the ultrasound according to the radiology report.  She has no evidence of infection, no pain with flexion extension, no tenderness to palpation.  I suspect pain is musculoskeletal or.  I gave her the reassuring news about no DVT and she will follow-up with orthopedics.  I recommended RICE and close follow up.  I gave my usual and customary return precautions.     ____________________________________________  FINAL CLINICAL IMPRESSION(S) / ED DIAGNOSES  Final diagnoses:  Acute pain of right knee     MEDICATIONS GIVEN DURING THIS VISIT:  Medications - No data to display   ED Discharge Orders    None       Note:  This document was prepared using Dragon voice recognition software and may include unintentional dictation errors.    Hinda Kehr, MD 01/28/18 [phone]

## 2018-01-28 NOTE — ED Triage Notes (Signed)
Pt reports pain and swelling in the right leg x three days. Redness noted to the lower right leg. Pain behind the right knee. Upon palpation and pain when flexing right leg. Pt currently on blood thinners. Pedal pulse is intact. Sensation is intact. No mechanical falls or injury to the right leg.

## 2018-02-22 NOTE — Progress Notes (Signed)
Patient ID: Destiny [name], female    DOB: [birth year], 62 y.o.   MRN: [id number]  HPI  Destiny [name] is a 62 y/o female with a history of CAD, hyperlipidemia, HTN, stroke, obstructive sleep apnea (wears CPAP), hypokalemia, GERD, bladder cancer and chronic heart failure.   Echo report from 11/23/17 reviewed and showed an EF of 60-65% along with mild/moderate MR. Echo report from 02/05/17 reviewed and showed an EF of 55-60% along with mild MR.   Was in the ED 01/28/18 due to right knee pain. [place] was negative for DVT/ Baker's cyst. Released same day. Was in the ED 01/14/18 due to RLQ abdominal pain. Treated and released.  Admitted 11/13/17 due to a fall. Potassium level was low. Had been taking metolazone and torsemide. Metolazone was stopped at discharge and potassium was supplemented. Discharged the following day. Admitted 11/05/17 due to hypokalemia. IV fluids were given along with replenishing potassium. Discharged the following day with instructions to stop metolazone & torsemide and use spironolactone. Was in the ED 10/11/17 due to left arm pain s/p a fall 2 weeks prior. Xray ruled out fracture. She was evaluated and released.   She presents today for a follow-up visit with a chief complaint of minimal shortness of breath upon moderate exertion. She describes this as chronic in nature having been present for several years. She has associated fatigue, head congestion, light-headedness, abdominal pain, back pain and fluctuating weight along with this. She denies any difficulty sleeping, abdominal distention, palpitations, pedal edema or chest pain. Has recently had cataracts removed. Has been experiencing leg cramps recently and does have a history of hypokalemia.   Past Medical History:  Diagnosis Date  . CAD (coronary artery disease)   . CHF (congestive heart failure) ([place])   . CVA (cerebral infarction)   . Female bladder prolapse   . GERD (gastroesophageal reflux disease)   . Hyperlipidemia   .  Hypertension   . Hypokalemia   . Mitral valve disease   . Sleep apnea   . Stroke [facility])    x3   Past Surgical History:  Procedure Laterality Date  . CARPAL TUNNEL RELEASE Bilateral   . CATARACT EXTRACTION Right 11/22/2017  . CHOLECYSTECTOMY    . COLON SURGERY    . TENNIS ELBOW RELEASE/NIRSCHEL PROCEDURE Left   . TONSILLECTOMY     Family History  Problem Relation Age of Onset  . Heart disease Other   . Hypertension Other   . Rheum arthritis Mother   . CAD Father    Social History   Tobacco Use  . Smoking status: Never Smoker  . Smokeless tobacco: Never Used  Substance Use Topics  . Alcohol use: No    Alcohol/week: 0.0 standard drinks   Allergies  Allergen Reactions  . Atorvastatin Hives    No s/sx of anaphylaxis, just intermittent hives.  Does not seem to be a class effect as she has tolerated lovastatin previously No s/sx of anaphylaxis, just intermittent hives.  Does not seem to be a class effect as she has tolerated lovastatin previously  . Fluconazole Rash    Reaction to generic but not name brand Reaction to generic but not name brand CAN USE DIFLUCAN.DOES NOT BREAK OUT WITH DIFLUCAN.(allergic only to generic brand)  . Sertraline Itching  . Sulfa Antibiotics Rash and Other (See Comments)    "hot " sensation  . Sulfamethoxazole-Trimethoprim Rash and Other (See Comments)    "felt hot" Other reaction(s): Other (See Comments) "felt hot"  . Sulfasalazine  Other (See Comments) and Rash    "hot " sensation   Prior to Admission medications   Medication Sig Start Date End Date Taking? Authorizing Provider  acetaminophen (TYLENOL) 500 MG tablet Take 1,000 mg by mouth every 6 (six) hours as needed for headache.    Yes [provider]  aspirin EC 81 MG tablet Take 81 mg by mouth every morning.    Yes [provider]  cetirizine (ZYRTEC) 10 MG tablet Take 1 tablet (10 mg total) by mouth daily. Patient taking differently: Take 10 mg by mouth daily as  needed for allergies.  12/22/16  Yes Triplett, Cari B, FNP  Cholecalciferol (VITAMIN D3) 5000 UNITS CAPS Take 5,000 Units by mouth daily.   Yes [provider]  clonazePAM (KLONOPIN) 0.5 MG tablet Take 0.5 mg by mouth at bedtime as needed for anxiety.   Yes [provider]  conjugated estrogens (PREMARIN) vaginal cream Place 1 Applicatorful vaginally daily. Mondays and Fridays   Yes [provider]  diphenhydrAMINE (BENADRYL) 25 mg capsule Take 25 mg by mouth every 4 (four) hours as needed.   Yes [provider]  Docusate Calcium (STOOL SOFTENER PO) Take 100 mg by mouth daily.    Yes [provider]  esomeprazole (NEXIUM) 40 MG capsule Take 40 mg by mouth 2 (two) times daily.   Yes [provider]  fluticasone (FLONASE) 50 MCG/ACT nasal spray Place 2 sprays into both nostrils daily as needed for allergies or rhinitis.   Yes [provider]  miconazole (MICOTIN) 2 % powder Apply 1 application topically as needed for itching.   Yes [provider]  nitroGLYCERIN (NITROSTAT) 0.4 MG SL tablet Place 0.4 mg under the tongue every 5 (five) minutes as needed for chest pain.   Yes [provider]  PARoxetine (PAXIL) 30 MG tablet Take 30 mg by mouth daily.   Yes [provider]  potassium chloride SA (K-DUR,KLOR-CON) 20 MEQ tablet Take 2 tablets (40 mEq total) by mouth 2 (two) times daily. Patient taking differently: Take 40 mEq by mouth 2 (two) times daily. Pt taking 20 MEQ daily 11/06/17  Yes Sudini, Alveta Heimlich, MD  pregabalin (LYRICA) 75 MG capsule Take 75 mg by mouth 3 (three) times daily. 1 tablet in the AM, 1 tablet mid day, 2 tablets at 9:30pm   Yes [provider]  rosuvastatin (CRESTOR) 20 MG tablet Take 20 mg by mouth at bedtime.    Yes [provider]  spironolactone (ALDACTONE) 25 MG tablet Take 0.5 tablets (12.5 mg total) by mouth daily. 11/14/17 11/14/18 Yes Wieting, Richard, MD  ticagrelor  (BRILINTA) 90 MG TABS tablet Take 90 mg by mouth 2 (two) times daily. 09/14/17  Yes [provider]  torsemide (DEMADEX) 20 MG tablet Take 1 tablet (20 mg total) by mouth daily. 11/14/17 11/14/18 Yes Wieting, Richard, MD  traZODone (DESYREL) 50 MG tablet Take 50 mg by mouth at bedtime.    [provider]  triamcinolone (KENALOG) 0.025 % ointment Apply 1 application topically 2 (two) times daily. Patient not taking: Reported on 11/24/2017 02/01/17   Laban Emperor, PA-C     Review of Systems  Constitutional: Positive for fatigue. Negative for appetite change.  HENT: Positive for congestion and rhinorrhea. Negative for sore throat.   Eyes: Negative.   Respiratory: Positive for shortness of breath (minimal). Negative for chest tightness.   Cardiovascular: Negative for chest pain, palpitations and leg swelling.  Gastrointestinal: Positive for abdominal pain (right lower side for  last month since previous fall). Negative for abdominal distention.  Endocrine: Negative.   Genitourinary: Negative.   Musculoskeletal: Positive for back pain. Negative for neck pain.  Skin: Negative.   Allergic/Immunologic: Negative.   Neurological: Positive for light-headedness. Negative for dizziness.       Leg cramps  Hematological: Negative for adenopathy. Does not bruise/bleed easily.  Psychiatric/Behavioral: Negative for dysphoric mood and sleep disturbance (wearing CPAP nightly). The patient is not nervous/anxious.    Vitals:   02/23/18 1009  BP: 115/79  Pulse: 84  Resp: 18  SpO2: 97%  Weight: 224 lb 8 oz (101.8 kg)  Height: 5\' 2"  (1.575 m)   Wt Readings from Last 3 Encounters:  02/23/18 224 lb 8 oz (101.8 kg)  01/28/18 225 lb (102.1 kg)  11/24/17 219 lb 2 oz (99.4 kg)   Lab Results  Component Value Date   CREATININE 1.22 (H) 11/16/2017   CREATININE 1.21 (H) 11/14/2017   CREATININE 1.49 (H) 11/13/2017    Physical Exam  Constitutional: She is oriented to person, place, and time.  She appears well-developed and well-nourished.  HENT:  Head: Normocephalic and atraumatic.  Neck: Normal range of motion. Neck supple. No JVD present.  Cardiovascular: Normal rate and regular rhythm.  Pulmonary/Chest: Effort normal. She has no wheezes. She has no rales.  Abdominal: Soft. She exhibits no distension.  Musculoskeletal: She exhibits no edema or tenderness.       Right lower leg: She exhibits no edema.       Left lower leg: She exhibits no edema.  Neurological: She is alert and oriented to person, place, and time.  Skin: Skin is warm and dry.  Psychiatric: She has a normal mood and affect. Her behavior is normal.  Nursing note and vitals reviewed.  Assessment & Plan:  1: Chronic heart failure with preserved ejection fraction- - NYHA class II - euvolemic today - weighing daily and she was reminded to call for an overnight weight gain of >2 pounds or a weekly weight gain of >5 pounds; home weight chart reviewed and she hasn't had any weight gains per above parameters - weight up 5 pounds from last visit here 3 months ago - not adding salt and is trying to eat low sodium foods. Reviewed the importance of closely following a 2000mg  sodium diet - saw cardiology Gennette Pac) 11/21/17 and returns 02/27/18 - saw pulmonology Austin Miles) 01/24/18   2: Hypokalemia- - taking 47meq potassium QD  - on spironolactone 12.5mg  daily right now - will get BMP today as patient has been experiencing leg/hand cramps  3: HTN- - BP looks good today - metoprolol stopped at last visit - BMP on 01/14/18 reviewed and showed sodium 142, potassium 4.5 and GFR 74  4: Bladder cancer- - saw urologist Lottie Rater) 01/12/18 and returns on 02/26/18  Medication bottles were reviewed. She will follow-up with PCP regarding abdominal pain that has been present since a fall ~ 1 month ago.  Return here in 4 months or sooner for any questions/problems before then.

## 2018-02-23 ENCOUNTER — Encounter: Payer: Self-pay | Admitting: Family

## 2018-02-23 ENCOUNTER — Ambulatory Visit: Payer: Self-pay | Attending: Family | Admitting: Family

## 2018-02-23 VITALS — BP 115/79 | HR 84 | Resp 18 | Ht 62.0 in | Wt 224.5 lb

## 2018-02-23 DIAGNOSIS — Z888 Allergy status to other drugs, medicaments and biological substances status: Secondary | ICD-10-CM | POA: Insufficient documentation

## 2018-02-23 DIAGNOSIS — K219 Gastro-esophageal reflux disease without esophagitis: Secondary | ICD-10-CM | POA: Insufficient documentation

## 2018-02-23 DIAGNOSIS — Z79899 Other long term (current) drug therapy: Secondary | ICD-10-CM | POA: Insufficient documentation

## 2018-02-23 DIAGNOSIS — G4733 Obstructive sleep apnea (adult) (pediatric): Secondary | ICD-10-CM | POA: Insufficient documentation

## 2018-02-23 DIAGNOSIS — E785 Hyperlipidemia, unspecified: Secondary | ICD-10-CM | POA: Insufficient documentation

## 2018-02-23 DIAGNOSIS — Z7982 Long term (current) use of aspirin: Secondary | ICD-10-CM | POA: Insufficient documentation

## 2018-02-23 DIAGNOSIS — Z09 Encounter for follow-up examination after completed treatment for conditions other than malignant neoplasm: Secondary | ICD-10-CM | POA: Insufficient documentation

## 2018-02-23 DIAGNOSIS — Z882 Allergy status to sulfonamides status: Secondary | ICD-10-CM | POA: Insufficient documentation

## 2018-02-23 DIAGNOSIS — Z9049 Acquired absence of other specified parts of digestive tract: Secondary | ICD-10-CM | POA: Insufficient documentation

## 2018-02-23 DIAGNOSIS — I2510 Atherosclerotic heart disease of native coronary artery without angina pectoris: Secondary | ICD-10-CM | POA: Insufficient documentation

## 2018-02-23 DIAGNOSIS — C679 Malignant neoplasm of bladder, unspecified: Secondary | ICD-10-CM | POA: Insufficient documentation

## 2018-02-23 DIAGNOSIS — Z9889 Other specified postprocedural states: Secondary | ICD-10-CM | POA: Insufficient documentation

## 2018-02-23 DIAGNOSIS — Z8673 Personal history of transient ischemic attack (TIA), and cerebral infarction without residual deficits: Secondary | ICD-10-CM | POA: Insufficient documentation

## 2018-02-23 DIAGNOSIS — E876 Hypokalemia: Secondary | ICD-10-CM | POA: Insufficient documentation

## 2018-02-23 DIAGNOSIS — Z7952 Long term (current) use of systemic steroids: Secondary | ICD-10-CM | POA: Insufficient documentation

## 2018-02-23 DIAGNOSIS — I110 Hypertensive heart disease with heart failure: Secondary | ICD-10-CM | POA: Insufficient documentation

## 2018-02-23 DIAGNOSIS — Z883 Allergy status to other anti-infective agents status: Secondary | ICD-10-CM | POA: Insufficient documentation

## 2018-02-23 DIAGNOSIS — I5032 Chronic diastolic (congestive) heart failure: Secondary | ICD-10-CM | POA: Insufficient documentation

## 2018-02-23 DIAGNOSIS — I10 Essential (primary) hypertension: Secondary | ICD-10-CM

## 2018-02-23 DIAGNOSIS — Z8249 Family history of ischemic heart disease and other diseases of the circulatory system: Secondary | ICD-10-CM | POA: Insufficient documentation

## 2018-02-23 LAB — BASIC METABOLIC PANEL
ANION GAP: 8 (ref 5–15)
BUN: 19 mg/dL (ref 8–23)
CHLORIDE: 102 mmol/L (ref 98–111)
CO2: 31 mmol/L (ref 22–32)
Calcium: 9.1 mg/dL (ref 8.9–10.3)
Creatinine, Ser: 0.97 mg/dL (ref 0.44–1.00)
Glucose, Bld: 111 mg/dL — ABNORMAL HIGH (ref 70–99)
POTASSIUM: 4.1 mmol/L (ref 3.5–5.1)
SODIUM: 141 mmol/L (ref 135–145)

## 2018-02-23 NOTE — Patient Instructions (Signed)
Continue weighing daily and call for an overnight weight gain of > 2 pounds or a weekly weight gain of >5 pounds. 

## 2018-05-25 ENCOUNTER — Emergency Department
Admission: EM | Admit: 2018-05-25 | Discharge: 2018-05-25 | Disposition: A | Discharge status: Home / Self Care | Payer: Self-pay | Attending: Emergency Medicine | Admitting: Emergency Medicine

## 2018-05-25 ENCOUNTER — Ambulatory Visit: Payer: Self-pay | Attending: Family | Admitting: Family

## 2018-05-25 ENCOUNTER — Emergency Department: Payer: Self-pay

## 2018-05-25 ENCOUNTER — Encounter: Payer: Self-pay | Admitting: Emergency Medicine

## 2018-05-25 ENCOUNTER — Other Ambulatory Visit: Payer: Self-pay

## 2018-05-25 ENCOUNTER — Encounter: Payer: Self-pay | Admitting: Family

## 2018-05-25 VITALS — BP 118/80 | HR 92 | Resp 18 | Ht 62.0 in | Wt 234.5 lb

## 2018-05-25 DIAGNOSIS — I5032 Chronic diastolic (congestive) heart failure: Secondary | ICD-10-CM | POA: Insufficient documentation

## 2018-05-25 DIAGNOSIS — K219 Gastro-esophageal reflux disease without esophagitis: Secondary | ICD-10-CM | POA: Insufficient documentation

## 2018-05-25 DIAGNOSIS — Z9049 Acquired absence of other specified parts of digestive tract: Secondary | ICD-10-CM | POA: Insufficient documentation

## 2018-05-25 DIAGNOSIS — I2510 Atherosclerotic heart disease of native coronary artery without angina pectoris: Secondary | ICD-10-CM | POA: Insufficient documentation

## 2018-05-25 DIAGNOSIS — G4733 Obstructive sleep apnea (adult) (pediatric): Secondary | ICD-10-CM | POA: Insufficient documentation

## 2018-05-25 DIAGNOSIS — Z8673 Personal history of transient ischemic attack (TIA), and cerebral infarction without residual deficits: Secondary | ICD-10-CM | POA: Insufficient documentation

## 2018-05-25 DIAGNOSIS — E876 Hypokalemia: Secondary | ICD-10-CM | POA: Insufficient documentation

## 2018-05-25 DIAGNOSIS — I110 Hypertensive heart disease with heart failure: Secondary | ICD-10-CM | POA: Insufficient documentation

## 2018-05-25 DIAGNOSIS — Z8249 Family history of ischemic heart disease and other diseases of the circulatory system: Secondary | ICD-10-CM | POA: Insufficient documentation

## 2018-05-25 DIAGNOSIS — Z882 Allergy status to sulfonamides status: Secondary | ICD-10-CM | POA: Insufficient documentation

## 2018-05-25 DIAGNOSIS — Y9301 Activity, walking, marching and hiking: Secondary | ICD-10-CM | POA: Insufficient documentation

## 2018-05-25 DIAGNOSIS — S93402A Sprain of unspecified ligament of left ankle, initial encounter: Secondary | ICD-10-CM

## 2018-05-25 DIAGNOSIS — Z7982 Long term (current) use of aspirin: Secondary | ICD-10-CM | POA: Insufficient documentation

## 2018-05-25 DIAGNOSIS — Z9889 Other specified postprocedural states: Secondary | ICD-10-CM | POA: Insufficient documentation

## 2018-05-25 DIAGNOSIS — E785 Hyperlipidemia, unspecified: Secondary | ICD-10-CM | POA: Insufficient documentation

## 2018-05-25 DIAGNOSIS — Y999 Unspecified external cause status: Secondary | ICD-10-CM | POA: Insufficient documentation

## 2018-05-25 DIAGNOSIS — Z8551 Personal history of malignant neoplasm of bladder: Secondary | ICD-10-CM | POA: Insufficient documentation

## 2018-05-25 DIAGNOSIS — S93602A Unspecified sprain of left foot, initial encounter: Secondary | ICD-10-CM

## 2018-05-25 DIAGNOSIS — I10 Essential (primary) hypertension: Secondary | ICD-10-CM

## 2018-05-25 DIAGNOSIS — Y929 Unspecified place or not applicable: Secondary | ICD-10-CM | POA: Insufficient documentation

## 2018-05-25 DIAGNOSIS — Z79899 Other long term (current) drug therapy: Secondary | ICD-10-CM | POA: Insufficient documentation

## 2018-05-25 DIAGNOSIS — Z883 Allergy status to other anti-infective agents status: Secondary | ICD-10-CM | POA: Insufficient documentation

## 2018-05-25 DIAGNOSIS — X58XXXA Exposure to other specified factors, initial encounter: Secondary | ICD-10-CM | POA: Insufficient documentation

## 2018-05-25 NOTE — Progress Notes (Signed)
Patient ID: Destiny [name], female    DOB: 10-17-1955, 62 y.o.   MRN: [id number]  HPI  Destiny [name] is a 62 y/o female with a history of CAD, hyperlipidemia, HTN, stroke, obstructive sleep apnea (wears CPAP), hypokalemia, GERD, bladder cancer and chronic heart failure.   Echo report from 11/23/17 reviewed and showed an EF of 60-65% along with mild/moderate MR. Echo report from 02/05/17 reviewed and showed an EF of 55-60% along with mild MR.   Was in the ED 05/17/18 to obtain an EKG for upcoming surgery. Was in the ED 01/28/18 due to right knee pain. [place] was negative for DVT/ Baker's cyst. Released same day. Was in the ED 01/14/18 due to RLQ abdominal pain. Treated and released.    She presents today for a follow-up visit with a chief complaint of minimal shortness of breath upon moderate exertion. She describes this as chronic in nature having been present for several years. She has associated fatigue, cough, light-headedness, pedal edema and gradual weight gain. She denies any difficulty sleeping, abdominal distention, palpitations or chest pain. Scheduled for hernia repair and bladder tuck on 06/04/18.   Past Medical History:  Diagnosis Date  . CAD (coronary artery disease)   . CHF (congestive heart failure) ([place])   . CVA (cerebral infarction)   . Female bladder prolapse   . GERD (gastroesophageal reflux disease)   . Hyperlipidemia   . Hypertension   . Hypokalemia   . Mitral valve disease   . Sleep apnea   . Stroke [facility])    x3   Past Surgical History:  Procedure Laterality Date  . CARPAL TUNNEL RELEASE Bilateral   . CATARACT EXTRACTION Right 11/22/2017  . CHOLECYSTECTOMY    . COLON SURGERY    . TENNIS ELBOW RELEASE/NIRSCHEL PROCEDURE Left   . TONSILLECTOMY     Family History  Problem Relation Age of Onset  . Heart disease Other   . Hypertension Other   . Rheum arthritis Mother   . CAD Father    Social History   Tobacco Use  . Smoking status: Never Smoker  . Smokeless  tobacco: Never Used  Substance Use Topics  . Alcohol use: No    Alcohol/week: 0.0 standard drinks   Allergies  Allergen Reactions  . Atorvastatin Hives    No s/sx of anaphylaxis, just intermittent hives.  Does not seem to be a class effect as she has tolerated lovastatin previously No s/sx of anaphylaxis, just intermittent hives.  Does not seem to be a class effect as she has tolerated lovastatin previously  . Fluconazole Rash    Reaction to generic but not name brand Reaction to generic but not name brand CAN USE DIFLUCAN.DOES NOT BREAK OUT WITH DIFLUCAN.(allergic only to generic brand)  . Sertraline Itching  . Sulfa Antibiotics Rash and Other (See Comments)    "hot " sensation  . Sulfamethoxazole-Trimethoprim Rash and Other (See Comments)    "felt hot" Other reaction(s): Other (See Comments) "felt hot"  . Sulfasalazine Other (See Comments) and Rash    "hot " sensation   Prior to Admission medications   Medication Sig Start Date End Date Taking? Authorizing Provider  acetaminophen (TYLENOL) 500 MG tablet Take 1,000 mg by mouth every 6 (six) hours as needed for headache.    Yes [provider]  aspirin EC 81 MG tablet Take 81 mg by mouth every morning.    Yes [provider]  cetirizine (ZYRTEC) 10 MG tablet Take 1 tablet (10 mg  total) by mouth daily. Patient taking differently: Take 10 mg by mouth daily as needed for allergies.  12/22/16  Yes Triplett, Cari B, FNP  Cholecalciferol (VITAMIN D3) 5000 UNITS CAPS Take 5,000 Units by mouth daily.   Yes [provider]  clonazePAM (KLONOPIN) 0.5 MG tablet Take 0.5 mg by mouth at bedtime as needed for anxiety.   Yes [provider]  conjugated estrogens (PREMARIN) vaginal cream Place 1 Applicatorful vaginally daily. Mondays and Fridays   Yes [provider]  diphenhydrAMINE (BENADRYL) 25 mg capsule Take 25 mg by mouth every 4 (four) hours as needed.   Yes [provider]  Docusate  Calcium (STOOL SOFTENER PO) Take 100 mg by mouth daily.    Yes [provider]  esomeprazole (NEXIUM) 40 MG capsule Take 40 mg by mouth 2 (two) times daily.   Yes [provider]  fluticasone (FLONASE) 50 MCG/ACT nasal spray Place 2 sprays into both nostrils daily as needed for allergies or rhinitis.   Yes [provider]  miconazole (MICOTIN) 2 % powder Apply 1 application topically as needed for itching.   Yes [provider]  nitroGLYCERIN (NITROSTAT) 0.4 MG SL tablet Place 0.4 mg under the tongue every 5 (five) minutes as needed for chest pain.   Yes [provider]  PARoxetine (PAXIL) 30 MG tablet Take 30 mg by mouth daily.   Yes [provider]  potassium chloride SA (K-DUR,KLOR-CON) 20 MEQ tablet Take 20 mEq by mouth daily.   Yes [provider]  pregabalin (LYRICA) 75 MG capsule Take 75 mg by mouth 3 (three) times daily. 2 tablet in the AM, and 2 tablets at 9:30pm   Yes [provider]  rosuvastatin (CRESTOR) 20 MG tablet Take 20 mg by mouth at bedtime.    Yes [provider]  sennosides-docusate sodium (SENOKOT-S) 8.6-50 MG tablet Take 1 tablet by mouth as needed.    Yes [provider]  spironolactone (ALDACTONE) 25 MG tablet Take 0.5 tablets (12.5 mg total) by mouth daily. 11/14/17 11/14/18 Yes Wieting, Richard, MD  ticagrelor (BRILINTA) 90 MG TABS tablet Take 90 mg by mouth 2 (two) times daily. 09/14/17  Yes [provider]  torsemide (DEMADEX) 20 MG tablet Take 1 tablet (20 mg total) by mouth daily. Patient taking differently: Take 60 mg by mouth 2 (two) times daily.  11/14/17 11/14/18 Yes Wieting, Richard, MD  traZODone (DESYREL) 50 MG tablet Take 50 mg by mouth at bedtime.    [provider]    Review of Systems  Constitutional: Positive for fatigue. Negative for appetite change.  HENT: Positive for rhinorrhea. Negative for congestion and sore throat.   Eyes: Negative.    Respiratory: Positive for cough and shortness of breath. Negative for chest tightness.   Cardiovascular: Positive for leg swelling. Negative for chest pain and palpitations.  Gastrointestinal: Negative for abdominal distention and abdominal pain.  Endocrine: Negative.   Genitourinary: Negative.   Musculoskeletal: Positive for back pain. Negative for neck pain.  Skin: Negative.   Allergic/Immunologic: Negative.   Neurological: Positive for light-headedness. Negative for dizziness.  Hematological: Negative for adenopathy. Does not bruise/bleed easily.  Psychiatric/Behavioral: Negative for dysphoric mood and sleep disturbance (wearing CPAP nightly). The patient is not nervous/anxious.    Vitals:   05/25/18 1011  BP: 118/80  Pulse: 92  Resp: 18  SpO2: 98%  Weight: 234 lb 8 oz (106.4 kg)  Height: 5\' 2"  (1.575 m)   Wt Readings from Last 3 Encounters:  05/25/18 234 lb 8 oz (106.4 kg)  02/23/18 224 lb 8 oz (101.8 kg)  01/28/18 225 lb (102.1 kg)   Lab Results  Component Value Date   CREATININE 0.97 02/23/2018   CREATININE 1.22 (H) 11/16/2017   CREATININE 1.21 (H) 11/14/2017    Physical Exam  Constitutional: She is oriented to person, place, and time. She appears well-developed and well-nourished.  HENT:  Head: Normocephalic and atraumatic.  Neck: Normal range of motion. Neck supple. No JVD present.  Cardiovascular: Normal rate and regular rhythm.  Pulmonary/Chest: Effort normal. She has no wheezes. She has no rales.  Abdominal: Soft. She exhibits no distension.  Musculoskeletal: She exhibits no tenderness.       Right lower leg: She exhibits edema (trace pitting). She exhibits no tenderness.       Left lower leg: She exhibits no tenderness and no edema.  Neurological: She is alert and oriented to person, place, and time.  Skin: Skin is warm and dry.  Psychiatric: She has a normal mood and affect. Her behavior is normal.  Nursing note and vitals reviewed.  Assessment &  Plan:  1: Chronic heart failure with preserved ejection fraction- - NYHA class II - euvolemic today - weighing daily and she was reminded to call for an overnight weight gain of >2 pounds or a weekly weight gain of >5 pounds - weight up 10 pounds from last visit 3 months ago  - not adding salt and is trying to eat low sodium foods. Reviewed the importance of closely following a 2000mg  sodium diet - saw cardiology Gennette Pac) 02/27/18 - saw pulmonology Austin Miles) 01/24/18  - PharmD reconciled medications with the patient - patient reports receiving her flu vaccine for this season  2: HTN- - BP looks good today - BMP on 02/23/18 reviewed and showed sodium 141, potassium 4.1, creatinine 0.97 and GFR >60  Patient did not bring her medications nor a list. Each medication was verbally reviewed with the patient and she was encouraged to bring the bottles to every visit to confirm accuracy of list.  Return in 6 months or sooner for any questions/problems before then.

## 2018-05-25 NOTE — Patient Instructions (Signed)
Continue weighing daily and call for an overnight weight gain of > 2 pounds or a weekly weight gain of >5 pounds. 

## 2018-05-25 NOTE — ED Triage Notes (Signed)
PT c/o LFt foot pain since yesterday after increased ambulation while shopping yesterday. PT states wore shoes with no support. VSS

## 2018-05-25 NOTE — Discharge Instructions (Addendum)
Your exam and x-ray are thankfully normal following your incident. Wear the ace bandage as needed. Apply ice our use warm epsom salt soaks to reduce pain. Follow-up with Dr. Vickki Muff as needed.

## 2018-05-25 NOTE — ED Provider Notes (Signed)
[facility] Emergency Department Provider Note ____________________________________________  Time seen: 1641  I have reviewed the triage vital signs and the nursing notes.  HISTORY  Chief Complaint  Foot Pain  HPI Destiny [name] is a 62 y.o. female who presents to the ED today by her husband, for evaluation of left foot and ankle pain.  Patient describes yesterday she walked the better part of the day in some flat slide-on shoes.  She typically wears a tie-up orthopedic shoe.  She denies any injury, accident, trauma, or fall.  She presents today with pain to the lateral aspect of the foot and ankle.  No other injuries reported at this time patient denies any history of gout, joint effusion, foot sprain, or ongoing foot problems.  She does give a history of degenerative joint disease.  Past Medical History:  Diagnosis Date  . CAD (coronary artery disease)   . CHF (congestive heart failure) ([place])   . CVA (cerebral infarction)   . Female bladder prolapse   . GERD (gastroesophageal reflux disease)   . Hyperlipidemia   . Hypertension   . Hypokalemia   . Mitral valve disease   . Sleep apnea   . Stroke [facility])    x3    Patient Active Problem List   Diagnosis Date Noted  . Chronic diastolic heart failure ([place]) 11/15/2017  . HTN (hypertension) 11/15/2017  . Hypokalemia 11/05/2017  . Chest pain 02/14/2016  . Small bowel obstruction ([place]) 06/16/2015    Past Surgical History:  Procedure Laterality Date  . CARPAL TUNNEL RELEASE Bilateral   . CATARACT EXTRACTION Right 11/22/2017  . CHOLECYSTECTOMY    . COLON SURGERY    . TENNIS ELBOW RELEASE/NIRSCHEL PROCEDURE Left   . TONSILLECTOMY      Prior to Admission medications   Medication Sig Start Date End Date Taking? Authorizing Provider  acetaminophen (TYLENOL) 500 MG tablet Take 1,000 mg by mouth every 6 (six) hours as needed for headache.     [provider]  aspirin EC 81 MG tablet Take 81 mg by  mouth every morning.     [provider]  cetirizine (ZYRTEC) 10 MG tablet Take 1 tablet (10 mg total) by mouth daily. Patient taking differently: Take 10 mg by mouth daily as needed for allergies.  12/22/16   Triplett, Johnette Abraham B, FNP  Cholecalciferol (VITAMIN D3) 5000 UNITS CAPS Take 5,000 Units by mouth daily.    [provider]  clonazePAM (KLONOPIN) 0.5 MG tablet Take 0.5 mg by mouth at bedtime as needed for anxiety.    [provider]  conjugated estrogens (PREMARIN) vaginal cream Place 1 Applicatorful vaginally daily. Mondays and Fridays    [provider]  diphenhydrAMINE (BENADRYL) 25 mg capsule Take 25 mg by mouth every 4 (four) hours as needed.    [provider]  Docusate Calcium (STOOL SOFTENER PO) Take 100 mg by mouth daily.     [provider]  esomeprazole (NEXIUM) 40 MG capsule Take 40 mg by mouth 2 (two) times daily.    [provider]  fluticasone (FLONASE) 50 MCG/ACT nasal spray Place 2 sprays into both nostrils daily as needed for allergies or rhinitis.    [provider]  miconazole (MICOTIN) 2 % powder Apply 1 application topically as needed for itching.    [provider]  nitroGLYCERIN (NITROSTAT) 0.4 MG SL tablet Place 0.4 mg under the tongue every 5 (five) minutes as needed for chest pain.    [provider]  PARoxetine (PAXIL) 30 MG tablet Take 30 mg by mouth daily.    [provider]  potassium chloride SA (K-DUR,KLOR-CON) 20 MEQ tablet Take 20 mEq by mouth daily.    [provider]  pregabalin (LYRICA) 75 MG capsule Take 75 mg by mouth 3 (three) times daily. 2 tablet in the AM, and 2 tablets at 9:30pm    [provider]  rosuvastatin (CRESTOR) 20 MG tablet Take 20 mg by mouth at bedtime.     [provider]  sennosides-docusate sodium (SENOKOT-S) 8.6-50 MG tablet Take 1 tablet by mouth as needed.     [provider]  spironolactone (ALDACTONE)  25 MG tablet Take 0.5 tablets (12.5 mg total) by mouth daily. 11/14/17 11/14/18  Loletha Grayer, MD  ticagrelor (BRILINTA) 90 MG TABS tablet Take 90 mg by mouth 2 (two) times daily. 09/14/17   [provider]  torsemide (DEMADEX) 20 MG tablet Take 1 tablet (20 mg total) by mouth daily. Patient taking differently: Take 60 mg by mouth 2 (two) times daily.  11/14/17 11/14/18  Loletha Grayer, MD  traZODone (DESYREL) 50 MG tablet Take 50 mg by mouth at bedtime.    [provider]    Allergies Atorvastatin; Fluconazole; Sertraline; Sulfa antibiotics; Sulfamethoxazole-trimethoprim; and Sulfasalazine  Family History  Problem Relation Age of Onset  . Heart disease Other   . Hypertension Other   . Rheum arthritis Mother   . CAD Father     Social History Social History   Tobacco Use  . Smoking status: Never Smoker  . Smokeless tobacco: Never Used  Substance Use Topics  . Alcohol use: No    Alcohol/week: 0.0 standard drinks  . Drug use: No    Review of Systems  Constitutional: Negative for fever. Cardiovascular: Negative for chest pain. Respiratory: Negative for shortness of breath. Musculoskeletal: Negative for back pain.  Left foot pain as above. Skin: Negative for rash. Neurological: Negative for headaches, focal weakness or numbness. ____________________________________________  PHYSICAL EXAM:  VITAL SIGNS: ED Triage Vitals [05/25/18 1521]  Enc Vitals Group     BP 130/77     Pulse Rate 97     Resp 16     Temp 98 F (36.7 C)     Temp Source Oral     SpO2 98 %     Weight      Height      Head Circumference      Peak Flow      Pain Score 10     Pain Loc      Pain Edu?      Excl. in [place]?     Constitutional: Alert and oriented. Well appearing and in no distress. Head: Normocephalic and atraumatic. Eyes: Conjunctivae are normal. Normal extraocular movements Cardiovascular: Normal rate, regular rhythm. Normal distal pulses. Respiratory: Normal  respiratory effort. No wheezes/rales/rhonchi. Musculoskeletal: Foot without any obvious deformity, dislocation, or joint effusion.  Patient with mild tenderness to palpation over the lateral aspect of the ankle over the lateral malleolus.  She is able to demonstrate normal ankle range of motion and resistance testing.  Negative anterior/posterior drawer.  No calf or Achilles tenderness is elicited.  No plantar heel pain is noted and no tenderness across the plantar surface of the MTPs is exhibited.  Nontender with normal range of motion in all other extremities.  Neurologic:  Normal gait without ataxia. Normal speech and language. No gross focal neurologic deficits are appreciated. Skin:  Skin is warm, dry  and intact. No rash noted. ___________________________________________   RADIOLOGY  Left Ankle  negative ____________________________________________  PROCEDURES  Procedures Ace bandage ____________________________________________  INITIAL IMPRESSION / ASSESSMENT AND PLAN / ED COURSE  Patient with ED evaluation of lateral left foot and ankle pain.  Patient's exam is overall benign.  X-rays negative for any acute fracture or dislocation.  Her symptoms likely represent a mild foot sprain.  Patient will be discharged with a Ace wrap to the ankle for support.  She is encouraged to wear her orthopedic shoes to ambulate.  She is referred to podiatry for ongoing symptom management.  Return precautions have been reviewed.  She will take over-the-counter Tylenol as needed. ____________________________________________  FINAL CLINICAL IMPRESSION(S) / ED DIAGNOSES  Final diagnoses:  Sprain of left foot, initial encounter  Sprain of left ankle, unspecified ligament, initial encounter     Melvenia Needles, PA-C 05/25/18 1840    Arta Silence, MD 05/25/18 2304

## 2018-05-26 ENCOUNTER — Encounter: Payer: Self-pay | Admitting: Family

## 2018-08-17 ENCOUNTER — Encounter: Payer: Self-pay | Admitting: Emergency Medicine

## 2018-08-17 ENCOUNTER — Emergency Department
Admission: EM | Admit: 2018-08-17 | Discharge: 2018-08-17 | Disposition: A | Discharge status: Home / Self Care | Payer: Medicaid Other | Attending: Emergency Medicine | Admitting: Emergency Medicine

## 2018-08-17 ENCOUNTER — Other Ambulatory Visit: Payer: Self-pay

## 2018-08-17 ENCOUNTER — Emergency Department: Payer: Medicaid Other

## 2018-08-17 DIAGNOSIS — I5032 Chronic diastolic (congestive) heart failure: Secondary | ICD-10-CM | POA: Insufficient documentation

## 2018-08-17 DIAGNOSIS — Z7982 Long term (current) use of aspirin: Secondary | ICD-10-CM | POA: Diagnosis not present

## 2018-08-17 DIAGNOSIS — I2510 Atherosclerotic heart disease of native coronary artery without angina pectoris: Secondary | ICD-10-CM | POA: Diagnosis not present

## 2018-08-17 DIAGNOSIS — Z79899 Other long term (current) drug therapy: Secondary | ICD-10-CM | POA: Insufficient documentation

## 2018-08-17 DIAGNOSIS — I110 Hypertensive heart disease with heart failure: Secondary | ICD-10-CM | POA: Insufficient documentation

## 2018-08-17 DIAGNOSIS — R079 Chest pain, unspecified: Secondary | ICD-10-CM | POA: Diagnosis present

## 2018-08-17 DIAGNOSIS — N390 Urinary tract infection, site not specified: Secondary | ICD-10-CM | POA: Diagnosis not present

## 2018-08-17 LAB — URINALYSIS, COMPLETE (UACMP) WITH MICROSCOPIC
Bilirubin Urine: NEGATIVE
Glucose, UA: NEGATIVE mg/dL
Ketones, ur: NEGATIVE mg/dL
NITRITE: NEGATIVE
Protein, ur: NEGATIVE mg/dL
Specific Gravity, Urine: 1.011 (ref 1.005–1.030)
pH: 6 (ref 5.0–8.0)

## 2018-08-17 LAB — CBC
HCT: 32.9 % — ABNORMAL LOW (ref 36.0–46.0)
HEMOGLOBIN: 10.2 g/dL — AB (ref 12.0–15.0)
MCH: 28 pg (ref 26.0–34.0)
MCHC: 31 g/dL (ref 30.0–36.0)
MCV: 90.4 fL (ref 80.0–100.0)
PLATELETS: 201 10*3/uL (ref 150–400)
RBC: 3.64 MIL/uL — ABNORMAL LOW (ref 3.87–5.11)
RDW: 15.4 % (ref 11.5–15.5)
WBC: 11.8 10*3/uL — AB (ref 4.0–10.5)
nRBC: 0 % (ref 0.0–0.2)

## 2018-08-17 LAB — BASIC METABOLIC PANEL
Anion gap: 9 (ref 5–15)
BUN: 15 mg/dL (ref 8–23)
CALCIUM: 8.7 mg/dL — AB (ref 8.9–10.3)
CO2: 28 mmol/L (ref 22–32)
CREATININE: 1.12 mg/dL — AB (ref 0.44–1.00)
Chloride: 100 mmol/L (ref 98–111)
GFR calc Af Amer: >60 mL/min (ref >60)
GFR, EST NON AFRICAN AMERICAN: 52 mL/min — AB (ref >60)
Glucose, Bld: 129 mg/dL — ABNORMAL HIGH (ref 70–99)
POTASSIUM: 3.5 mmol/L (ref 3.5–5.1)
SODIUM: 137 mmol/L (ref 135–145)

## 2018-08-17 LAB — INFLUENZA PANEL BY PCR (TYPE A & B)
Influenza A By PCR: NEGATIVE
Influenza B By PCR: NEGATIVE

## 2018-08-17 LAB — TROPONIN I

## 2018-08-17 MED ORDER — SODIUM CHLORIDE 0.9 % IV BOLUS
1000.0000 mL | Freq: Once | INTRAVENOUS | Status: AC
  Administered 2018-08-17: 1000 mL via INTRAVENOUS

## 2018-08-17 MED ORDER — CEPHALEXIN 500 MG PO CAPS: 500.0000 mg | ORAL_CAPSULE | Freq: Three times a day (TID) | ORAL | 0 refills | Status: DC

## 2018-08-17 MED ORDER — ONDANSETRON 4 MG PO TBDP: 4.0000 mg | ORAL_TABLET | Freq: Three times a day (TID) | ORAL | 0 refills | Status: DC | PRN

## 2018-08-17 MED ORDER — ACETAMINOPHEN 500 MG PO TABS
1000.0000 mg | ORAL_TABLET | Freq: Once | ORAL | Status: AC
  Administered 2018-08-17: 1000 mg via ORAL
  Filled 2018-08-17: qty 2

## 2018-08-17 MED ORDER — ONDANSETRON HCL 4 MG/2ML IJ SOLN
4.0000 mg | Freq: Once | INTRAMUSCULAR | Status: AC
  Administered 2018-08-17: 4 mg via INTRAVENOUS
  Filled 2018-08-17: qty 2

## 2018-08-17 MED ORDER — SODIUM CHLORIDE 0.9 % IV SOLN
1.0000 g | Freq: Once | INTRAVENOUS | Status: AC
  Administered 2018-08-17: 1 g via INTRAVENOUS
  Filled 2018-08-17: qty 10

## 2018-08-17 NOTE — ED Provider Notes (Signed)
[facility] Emergency Department Provider Note  Time seen: 8:00 PM  I have reviewed the triage vital signs and the nursing notes.   HISTORY  Chief Complaint Chest Pain    HPI Destiny [name] is a 63 y.o. female with a past medical history of CAD, CHF, CVA, gastric reflux, hypertension, hyperlipidemia, presents to the emergency department with symptoms of cough, mild chest discomfort, chills/fever at home, mild dysuria.  According to the patient over the past 3 days she has been experiencing chills, nausea with occasional episodes of vomiting, states she is also noted dysuria over the past several days, mild cough and chest tightness.  Patient states she was feeling weak today so she came to the emergency department for evaluation.   Past Medical History:  Diagnosis Date  . CAD (coronary artery disease)   . CHF (congestive heart failure) ([place])   . CVA (cerebral infarction)   . Female bladder prolapse   . GERD (gastroesophageal reflux disease)   . Hyperlipidemia   . Hypertension   . Hypokalemia   . Mitral valve disease   . Sleep apnea   . Stroke [facility])    x3    Patient Active Problem List   Diagnosis Date Noted  . Chronic diastolic heart failure ([place]) 11/15/2017  . HTN (hypertension) 11/15/2017  . Hypokalemia 11/05/2017  . Chest pain 02/14/2016  . Small bowel obstruction ([place]) 06/16/2015    Past Surgical History:  Procedure Laterality Date  . CARPAL TUNNEL RELEASE Bilateral   . CATARACT EXTRACTION Right 11/22/2017  . CHOLECYSTECTOMY    . COLON SURGERY    . TENNIS ELBOW RELEASE/NIRSCHEL PROCEDURE Left   . TONSILLECTOMY      Prior to Admission medications   Medication Sig Start Date End Date Taking? Authorizing Provider  acetaminophen (TYLENOL) 500 MG tablet Take 1,000 mg by mouth every 6 (six) hours as needed for headache.    Yes [provider]  aspirin EC 81 MG tablet Take 81 mg by mouth every morning.    Yes [provider]  cetirizine (ZYRTEC) 10 MG tablet Take 1 tablet (10 mg total) by mouth daily. Patient taking differently: Take 10 mg by mouth daily as needed for allergies.  12/22/16  Yes Triplett, Cari B, FNP  Cholecalciferol (VITAMIN D3) 5000 UNITS CAPS Take 5,000 Units by mouth daily.   Yes [provider]  clonazePAM (KLONOPIN) 0.5 MG tablet Take 0.5 mg by mouth at bedtime as needed for anxiety.   Yes [provider]  conjugated estrogens (PREMARIN) vaginal cream Place 1 Applicatorful vaginally daily. Mondays and Fridays   Yes [provider]  diphenhydrAMINE (BENADRYL) 25 mg capsule Take 25 mg by mouth every 4 (four) hours as needed.   Yes [provider]  Docusate Calcium (STOOL SOFTENER PO) Take 100 mg by mouth daily.    Yes [provider]  esomeprazole (NEXIUM) 40 MG capsule Take 40 mg by mouth 2 (two) times daily.   Yes [provider]  fluticasone (FLONASE) 50 MCG/ACT nasal spray Place 2 sprays into both nostrils daily as needed for allergies or rhinitis.   Yes [provider]  metoprolol succinate (TOPROL-XL) 25 MG 24 hr tablet Take 25 mg by mouth daily. 08/01/18  Yes [provider]  miconazole (MICOTIN) 2 % powder Apply 1 application topically as needed for itching.   Yes [provider]  nitroGLYCERIN (NITROSTAT) 0.4 MG SL tablet Place 0.4 mg under the tongue every 5 (five) minutes  as needed for chest pain.   Yes [provider]  PARoxetine (PAXIL) 30 MG tablet Take 30 mg by mouth daily.   Yes [provider]  potassium chloride SA (K-DUR,KLOR-CON) 20 MEQ tablet Take 20 mEq by mouth daily.   Yes [provider]  pregabalin (LYRICA) 75 MG capsule Take 75 mg by mouth 3 (three) times daily. 2 tablet in the AM, and 2 tablets at 9:30pm   Yes [provider]  rosuvastatin (CRESTOR) 20 MG tablet Take 20 mg by mouth at bedtime.    Yes [provider]  sennosides-docusate sodium  (SENOKOT-S) 8.6-50 MG tablet Take 1 tablet by mouth as needed.    Yes [provider]  spironolactone (ALDACTONE) 25 MG tablet Take 0.5 tablets (12.5 mg total) by mouth daily. 11/14/17 11/14/18 Yes Wieting, Richard, MD  torsemide (DEMADEX) 20 MG tablet Take 1 tablet (20 mg total) by mouth daily. 11/14/17 11/14/18 Yes Wieting, Richard, MD  traZODone (DESYREL) 50 MG tablet Take 50 mg by mouth at bedtime.   Yes [provider]    Allergies  Allergen Reactions  . Atorvastatin Hives    No s/sx of anaphylaxis, just intermittent hives.  Does not seem to be a class effect as she has tolerated lovastatin previously No s/sx of anaphylaxis, just intermittent hives.  Does not seem to be a class effect as she has tolerated lovastatin previously  . Fluconazole Rash    Reaction to generic but not name brand Reaction to generic but not name brand CAN USE DIFLUCAN.DOES NOT BREAK OUT WITH DIFLUCAN.(allergic only to generic brand)  . Sertraline Itching  . Sulfa Antibiotics Rash and Other (See Comments)    "hot " sensation  . Sulfamethoxazole-Trimethoprim Rash and Other (See Comments)    "felt hot" Other reaction(s): Other (See Comments) "felt hot"  . Sulfasalazine Other (See Comments) and Rash    "hot " sensation    Family History  Problem Relation Age of Onset  . Heart disease Other   . Hypertension Other   . Rheum arthritis Mother   . CAD Father     Social History Social History   Tobacco Use  . Smoking status: Never Smoker  . Smokeless tobacco: Never Used  Substance Use Topics  . Alcohol use: No    Alcohol/week: 0.0 standard drinks  . Drug use: No    Review of Systems Constitutional: Positive for subjective fever/chills ENT: No congestion. Cardiovascular: Intermittent chest tightness Respiratory: Occasional cough per patient. Gastrointestinal: Negative for abdominal pain.  Intermittent nausea vomiting. Genitourinary: 2 days of dysuria. Musculoskeletal: Negative  for musculoskeletal complaints Neurological: Negative for headache All other ROS negative  ____________________________________________   PHYSICAL EXAM:  VITAL SIGNS: ED Triage Vitals  Enc Vitals Group     BP 08/17/18 1626 118/68     Pulse Rate 08/17/18 1626 97     Resp 08/17/18 1626 (!) 22     Temp 08/17/18 1626 99.9 F (37.7 C)     Temp Source 08/17/18 1626 Oral     SpO2 08/17/18 1626 98 %     Weight 08/17/18 1627 229 lb (103.9 kg)     Height 08/17/18 1627 5\' 2"  (1.575 m)     Head Circumference --      Peak Flow --      Pain Score 08/17/18 1627 8     Pain Loc --      Pain Edu? --      Excl. in [place]? --  Constitutional: Alert and oriented. Well appearing and in no distress. Eyes: Normal exam ENT   Head: Normocephalic and atraumatic.   Mouth/Throat: Mucous membranes are moist. Cardiovascular: Normal rate, regular rhythm. No murmur Respiratory: Normal respiratory effort without tachypnea nor retractions. Breath sounds are clear Gastrointestinal: Soft and nontender. No distention Musculoskeletal: Nontender with normal range of motion in all extremities Neurologic:  Normal speech and language. No gross focal neurologic deficits Skin:  Skin is warm, dry and intact.  Psychiatric: Mood and affect are normal.   ____________________________________________    EKG  EKG viewed and interpreted by myself shows a normal sinus rhythm at 96 bpm with a narrow QRS, normal axis, normal intervals, no concerning ST changes.  ____________________________________________    RADIOLOGY  Chest x-ray is normal.  ____________________________________________   INITIAL IMPRESSION / ASSESSMENT AND PLAN / ED COURSE  Pertinent labs & imaging results that were available during my care of the patient were reviewed by me and considered in my medical decision making (see chart for details).  Patient presents to the emergency department with symptoms of fever/chills occasional cough,  as well as dysuria nausea vomiting symptoms x2 to 3 days.  Differential would include influenza, viral illness, upper respiratory infection, pneumonia, ACS, UTI.  Patient's work-up is overall reassuring, troponin is negative, basic labs are largely within normal limits, chest x-ray is negative.  Patient's urinalysis however does appear to show urinary tract infection.  Given the patient's symptoms we will treat with IV Rocephin.  Patient states she is feeling better after fluids.  Given otherwise reassuring work-up and a reassuring physical exam we will discharge home with antibiotics and have the patient follow-up with her doctor.  Patient agreeable to plan of care.  ____________________________________________   FINAL CLINICAL IMPRESSION(S) / ED DIAGNOSES  Urinary tract infection   Harvest Dark, MD 08/17/18 2003

## 2018-08-17 NOTE — ED Triage Notes (Signed)
Pt in via POV with complaints of chest pain, cough, fever x 3 days.  NAD noted at this time.

## 2018-08-17 NOTE — ED Notes (Signed)
Patient transported to X-ray 

## 2018-08-19 LAB — URINE CULTURE

## 2018-08-20 DIAGNOSIS — C5700 Malignant neoplasm of unspecified fallopian tube: Secondary | ICD-10-CM | POA: Insufficient documentation

## 2018-08-20 DIAGNOSIS — N9489 Other specified conditions associated with female genital organs and menstrual cycle: Secondary | ICD-10-CM | POA: Insufficient documentation

## 2018-08-25 DIAGNOSIS — K631 Perforation of intestine (nontraumatic): Secondary | ICD-10-CM | POA: Insufficient documentation

## 2018-09-09 DIAGNOSIS — T8149XA Infection following a procedure, other surgical site, initial encounter: Secondary | ICD-10-CM | POA: Insufficient documentation

## 2018-09-27 DIAGNOSIS — R6251 Failure to thrive (child): Secondary | ICD-10-CM | POA: Insufficient documentation

## 2018-09-28 DIAGNOSIS — F331 Major depressive disorder, recurrent, moderate: Secondary | ICD-10-CM | POA: Insufficient documentation

## 2018-10-06 DIAGNOSIS — Z9889 Other specified postprocedural states: Secondary | ICD-10-CM | POA: Insufficient documentation

## 2018-10-06 DIAGNOSIS — D696 Thrombocytopenia, unspecified: Secondary | ICD-10-CM

## 2018-10-06 DIAGNOSIS — E46 Unspecified protein-calorie malnutrition: Secondary | ICD-10-CM | POA: Insufficient documentation

## 2018-10-20 DIAGNOSIS — K632 Fistula of intestine: Secondary | ICD-10-CM | POA: Insufficient documentation

## 2018-10-24 DIAGNOSIS — R5381 Other malaise: Secondary | ICD-10-CM | POA: Insufficient documentation

## 2018-11-01 ENCOUNTER — Emergency Department: Payer: Medicaid Other

## 2018-11-01 ENCOUNTER — Encounter: Payer: Self-pay | Admitting: Emergency Medicine

## 2018-11-01 ENCOUNTER — Inpatient Hospital Stay
Admission: EM | Admit: 2018-11-01 | Discharge: 2018-11-09 | DRG: 871 | Disposition: A | Discharge status: Rehabilitation Facility | Payer: Medicaid Other | Attending: Internal Medicine | Admitting: Internal Medicine

## 2018-11-01 DIAGNOSIS — C763 Malignant neoplasm of pelvis: Secondary | ICD-10-CM

## 2018-11-01 DIAGNOSIS — Z79890 Hormone replacement therapy (postmenopausal): Secondary | ICD-10-CM

## 2018-11-01 DIAGNOSIS — Z8249 Family history of ischemic heart disease and other diseases of the circulatory system: Secondary | ICD-10-CM

## 2018-11-01 DIAGNOSIS — K219 Gastro-esophageal reflux disease without esophagitis: Secondary | ICD-10-CM | POA: Diagnosis present

## 2018-11-01 DIAGNOSIS — I959 Hypotension, unspecified: Secondary | ICD-10-CM | POA: Diagnosis present

## 2018-11-01 DIAGNOSIS — I110 Hypertensive heart disease with heart failure: Secondary | ICD-10-CM | POA: Diagnosis present

## 2018-11-01 DIAGNOSIS — D61818 Other pancytopenia: Secondary | ICD-10-CM | POA: Diagnosis present

## 2018-11-01 DIAGNOSIS — Z881 Allergy status to other antibiotic agents status: Secondary | ICD-10-CM

## 2018-11-01 DIAGNOSIS — Z882 Allergy status to sulfonamides status: Secondary | ICD-10-CM

## 2018-11-01 DIAGNOSIS — Z90722 Acquired absence of ovaries, bilateral: Secondary | ICD-10-CM

## 2018-11-01 DIAGNOSIS — Z8261 Family history of arthritis: Secondary | ICD-10-CM

## 2018-11-01 DIAGNOSIS — N8110 Cystocele, unspecified: Secondary | ICD-10-CM | POA: Diagnosis present

## 2018-11-01 DIAGNOSIS — Z79891 Long term (current) use of opiate analgesic: Secondary | ICD-10-CM

## 2018-11-01 DIAGNOSIS — F329 Major depressive disorder, single episode, unspecified: Secondary | ICD-10-CM | POA: Diagnosis present

## 2018-11-01 DIAGNOSIS — Z888 Allergy status to other drugs, medicaments and biological substances status: Secondary | ICD-10-CM

## 2018-11-01 DIAGNOSIS — R32 Unspecified urinary incontinence: Secondary | ICD-10-CM | POA: Diagnosis present

## 2018-11-01 DIAGNOSIS — F419 Anxiety disorder, unspecified: Secondary | ICD-10-CM | POA: Diagnosis present

## 2018-11-01 DIAGNOSIS — R471 Dysarthria and anarthria: Secondary | ICD-10-CM | POA: Diagnosis not present

## 2018-11-01 DIAGNOSIS — Z8673 Personal history of transient ischemic attack (TIA), and cerebral infarction without residual deficits: Secondary | ICD-10-CM

## 2018-11-01 DIAGNOSIS — T451X5A Adverse effect of antineoplastic and immunosuppressive drugs, initial encounter: Secondary | ICD-10-CM | POA: Diagnosis present

## 2018-11-01 DIAGNOSIS — C569 Malignant neoplasm of unspecified ovary: Secondary | ICD-10-CM | POA: Diagnosis present

## 2018-11-01 DIAGNOSIS — Z9221 Personal history of antineoplastic chemotherapy: Secondary | ICD-10-CM

## 2018-11-01 DIAGNOSIS — Z9049 Acquired absence of other specified parts of digestive tract: Secondary | ICD-10-CM

## 2018-11-01 DIAGNOSIS — I2510 Atherosclerotic heart disease of native coronary artery without angina pectoris: Secondary | ICD-10-CM | POA: Diagnosis present

## 2018-11-01 DIAGNOSIS — R29707 NIHSS score 7: Secondary | ICD-10-CM | POA: Diagnosis not present

## 2018-11-01 DIAGNOSIS — D696 Thrombocytopenia, unspecified: Secondary | ICD-10-CM

## 2018-11-01 DIAGNOSIS — G8194 Hemiplegia, unspecified affecting left nondominant side: Secondary | ICD-10-CM | POA: Diagnosis not present

## 2018-11-01 DIAGNOSIS — G4730 Sleep apnea, unspecified: Secondary | ICD-10-CM | POA: Diagnosis present

## 2018-11-01 DIAGNOSIS — R400 Somnolence: Secondary | ICD-10-CM | POA: Diagnosis present

## 2018-11-01 DIAGNOSIS — I5042 Chronic combined systolic (congestive) and diastolic (congestive) heart failure: Secondary | ICD-10-CM | POA: Diagnosis present

## 2018-11-01 DIAGNOSIS — J189 Pneumonia, unspecified organism: Secondary | ICD-10-CM | POA: Diagnosis present

## 2018-11-01 DIAGNOSIS — Z9079 Acquired absence of other genital organ(s): Secondary | ICD-10-CM

## 2018-11-01 DIAGNOSIS — E785 Hyperlipidemia, unspecified: Secondary | ICD-10-CM | POA: Diagnosis present

## 2018-11-01 DIAGNOSIS — Z8589 Personal history of malignant neoplasm of other organs and systems: Secondary | ICD-10-CM

## 2018-11-01 DIAGNOSIS — K5900 Constipation, unspecified: Secondary | ICD-10-CM

## 2018-11-01 DIAGNOSIS — Z8544 Personal history of malignant neoplasm of other female genital organs: Secondary | ICD-10-CM

## 2018-11-01 DIAGNOSIS — I639 Cerebral infarction, unspecified: Secondary | ICD-10-CM

## 2018-11-01 DIAGNOSIS — Z7951 Long term (current) use of inhaled steroids: Secondary | ICD-10-CM

## 2018-11-01 DIAGNOSIS — A419 Sepsis, unspecified organism: Principal | ICD-10-CM | POA: Diagnosis present

## 2018-11-01 DIAGNOSIS — Z20828 Contact with and (suspected) exposure to other viral communicable diseases: Secondary | ICD-10-CM | POA: Diagnosis present

## 2018-11-01 DIAGNOSIS — Z8543 Personal history of malignant neoplasm of ovary: Secondary | ICD-10-CM

## 2018-11-01 DIAGNOSIS — R29810 Facial weakness: Secondary | ICD-10-CM | POA: Diagnosis not present

## 2018-11-01 DIAGNOSIS — Z7982 Long term (current) use of aspirin: Secondary | ICD-10-CM

## 2018-11-01 DIAGNOSIS — E876 Hypokalemia: Secondary | ICD-10-CM | POA: Diagnosis present

## 2018-11-01 DIAGNOSIS — I6381 Other cerebral infarction due to occlusion or stenosis of small artery: Secondary | ICD-10-CM | POA: Diagnosis not present

## 2018-11-01 DIAGNOSIS — I5032 Chronic diastolic (congestive) heart failure: Secondary | ICD-10-CM | POA: Diagnosis present

## 2018-11-01 DIAGNOSIS — Z79899 Other long term (current) drug therapy: Secondary | ICD-10-CM

## 2018-11-01 DIAGNOSIS — C5700 Malignant neoplasm of unspecified fallopian tube: Secondary | ICD-10-CM | POA: Diagnosis present

## 2018-11-01 HISTORY — DX: Malignant neoplasm of unspecified ovary: C56.9

## 2018-11-01 LAB — CBC WITH DIFFERENTIAL/PLATELET
Abs Immature Granulocytes: 0.16 10*3/uL — ABNORMAL HIGH (ref 0.00–0.07)
Basophils Absolute: 0.1 10*3/uL (ref 0.0–0.1)
Basophils Relative: 1 %
Eosinophils Absolute: 0 10*3/uL (ref 0.0–0.5)
Eosinophils Relative: 0 %
HCT: 24.9 % — ABNORMAL LOW (ref 36.0–46.0)
Hemoglobin: 8.5 g/dL — ABNORMAL LOW (ref 12.0–15.0)
Immature Granulocytes: 2 %
Lymphocytes Relative: 14 %
Lymphs Abs: 1.4 10*3/uL (ref 0.7–4.0)
MCH: 29.7 pg (ref 26.0–34.0)
MCHC: 34.1 g/dL (ref 30.0–36.0)
MCV: 87.1 fL (ref 80.0–100.0)
Monocytes Absolute: 1 10*3/uL (ref 0.1–1.0)
Monocytes Relative: 9 %
Neutro Abs: 7.6 10*3/uL (ref 1.7–7.7)
Neutrophils Relative %: 74 %
Platelets: 122 10*3/uL — ABNORMAL LOW (ref 150–400)
RBC: 2.86 MIL/uL — ABNORMAL LOW (ref 3.87–5.11)
RDW: 15.4 % (ref 11.5–15.5)
WBC: 10.2 10*3/uL (ref 4.0–10.5)
nRBC: 0 % (ref 0.0–0.2)

## 2018-11-01 LAB — URINALYSIS, COMPLETE (UACMP) WITH MICROSCOPIC
Bacteria, UA: NONE SEEN
Bilirubin Urine: NEGATIVE
Glucose, UA: NEGATIVE mg/dL
Hgb urine dipstick: NEGATIVE
Ketones, ur: NEGATIVE mg/dL
Leukocytes,Ua: NEGATIVE
Nitrite: NEGATIVE
Protein, ur: NEGATIVE mg/dL
Specific Gravity, Urine: 1.008 (ref 1.005–1.030)
Squamous Epithelial / HPF: NONE SEEN (ref 0–5)
pH: 5 (ref 5.0–8.0)

## 2018-11-01 LAB — COMPREHENSIVE METABOLIC PANEL
ALT: 27 U/L (ref 0–44)
AST: 24 U/L (ref 15–41)
Albumin: 3.3 g/dL — ABNORMAL LOW (ref 3.5–5.0)
Alkaline Phosphatase: 75 U/L (ref 38–126)
Anion gap: 14 (ref 5–15)
BUN: 26 mg/dL — ABNORMAL HIGH (ref 8–23)
CO2: 23 mmol/L (ref 22–32)
Calcium: 8.1 mg/dL — ABNORMAL LOW (ref 8.9–10.3)
Chloride: 94 mmol/L — ABNORMAL LOW (ref 98–111)
Creatinine, Ser: 1.06 mg/dL — ABNORMAL HIGH (ref 0.44–1.00)
GFR calc Af Amer: >60 mL/min (ref >60)
GFR calc non Af Amer: 56 mL/min — ABNORMAL LOW (ref >60)
Glucose, Bld: 123 mg/dL — ABNORMAL HIGH (ref 70–99)
Potassium: 2.1 mmol/L — CL (ref 3.5–5.1)
Sodium: 131 mmol/L — ABNORMAL LOW (ref 135–145)
Total Bilirubin: 1.2 mg/dL (ref 0.3–1.2)
Total Protein: 7.1 g/dL (ref 6.5–8.1)

## 2018-11-01 LAB — MAGNESIUM: Magnesium: 1.6 mg/dL — ABNORMAL LOW (ref 1.7–2.4)

## 2018-11-01 LAB — LACTIC ACID, PLASMA: Lactic Acid, Venous: 1.2 mmol/L (ref 0.5–1.9)

## 2018-11-01 LAB — GLUCOSE, CAPILLARY: Glucose-Capillary: 104 mg/dL — ABNORMAL HIGH (ref 70–99)

## 2018-11-01 LAB — TROPONIN I: Troponin I: <0.03 ng/mL (ref <0.03)

## 2018-11-01 LAB — PROCALCITONIN: Procalcitonin: 0.54 ng/mL

## 2018-11-01 MED ORDER — METRONIDAZOLE IN NACL 5-0.79 MG/ML-% IV SOLN
500.0000 mg | Freq: Once | INTRAVENOUS | Status: AC
  Administered 2018-11-01: 23:00:00 500 mg via INTRAVENOUS
  Filled 2018-11-01: qty 100

## 2018-11-01 MED ORDER — NALOXONE HCL 2 MG/2ML IJ SOSY
0.4000 mg | PREFILLED_SYRINGE | Freq: Once | INTRAMUSCULAR | Status: AC
  Administered 2018-11-01: 23:00:00 0.4 mg via INTRAVENOUS
  Filled 2018-11-01: qty 2

## 2018-11-01 MED ORDER — SODIUM CHLORIDE 0.9 % IV SOLN
2.0000 g | Freq: Once | INTRAVENOUS | Status: AC
  Administered 2018-11-01: 2 g via INTRAVENOUS
  Filled 2018-11-01: qty 2

## 2018-11-01 MED ORDER — SODIUM CHLORIDE 0.9 % IV BOLUS
500.0000 mL | Freq: Once | INTRAVENOUS | Status: AC
  Administered 2018-11-01: 500 mL via INTRAVENOUS

## 2018-11-01 MED ORDER — VANCOMYCIN HCL IN DEXTROSE 1-5 GM/200ML-% IV SOLN: 1000.0000 mg | Freq: Once | INTRAVENOUS | Status: DC

## 2018-11-01 MED ORDER — POTASSIUM CHLORIDE 10 MEQ/100ML IV SOLN
10.0000 meq | Freq: Once | INTRAVENOUS | Status: AC
  Administered 2018-11-02: 10 meq via INTRAVENOUS
  Filled 2018-11-01: qty 100

## 2018-11-01 MED ORDER — VANCOMYCIN HCL 10 G IV SOLR
2000.0000 mg | Freq: Once | INTRAVENOUS | Status: AC
  Administered 2018-11-01: 2000 mg via INTRAVENOUS
  Filled 2018-11-01: qty 2000

## 2018-11-01 MED ORDER — SODIUM CHLORIDE 0.9 % IV BOLUS
1000.0000 mL | Freq: Once | INTRAVENOUS | Status: AC
  Administered 2018-11-01: 22:00:00 1000 mL via INTRAVENOUS

## 2018-11-01 MED ORDER — PIPERACILLIN-TAZOBACTAM 3.375 G IVPB 30 MIN
3.3750 g | Freq: Once | INTRAVENOUS | Status: AC
  Administered 2018-11-01: 3.375 g via INTRAVENOUS
  Filled 2018-11-01: qty 50

## 2018-11-01 NOTE — ED Notes (Signed)
Pt to xr 

## 2018-11-01 NOTE — ED Notes (Signed)
Patient transported to CT 

## 2018-11-01 NOTE — ED Provider Notes (Addendum)
[facility] Emergency Department Provider Note  ____________________________________________   I have reviewed the triage vital signs and the nursing notes. Where available I have reviewed prior notes and, if possible and indicated, outside hospital notes.    HISTORY  Chief Complaint Blood Infection    HPI Destiny [name] is a 63 y.o. female who was recently discharged with recent very complicated and prolonged stays at [facility].  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 antibiotics for 14 days, she is apparently status post 2 doses of carboplatin and is planning for chemotherapy as an outpatient.  She is a full code.  She is followed by home health.  According to family, she became gradually less well today.  She is doing okay before that.  They states that she stated that she felt "blah" and no further elucidation was provided.  She was sitting in the [facility] dozing in the chair most of the day but when they went to wake her up to go to bed they had difficulty arousing her EMS was called she was hypotensive for EMS but awake.  Patient arrives in that situation.  I did personally talk to EMS as well as the family,  very limited history from the patient.  Is been no documented fever or cough.  Level 5 chart caveat; no further history available due to patient status.  Past Medical History:  Diagnosis Date  . CAD (coronary artery disease)   . CHF (congestive heart failure) ([place])   . CVA (cerebral infarction)   . Female bladder prolapse   . GERD (gastroesophageal reflux disease)   . Hyperlipidemia   . Hypertension   . Hypokalemia   . Mitral valve disease   . Ovarian ca ([place])   . Sleep apnea   . Stroke [facility])    x3    Patient Active Problem List   Diagnosis Date Noted  . Chronic diastolic heart failure ([place]) 11/15/2017  . HTN (hypertension) 11/15/2017  . Hypokalemia 11/05/2017  . Chest pain 02/14/2016  . Small bowel obstruction ([place]) 06/16/2015    Past Surgical History:  Procedure Laterality Date  . CARPAL TUNNEL RELEASE Bilateral   . CATARACT EXTRACTION Right 11/22/2017  . CHOLECYSTECTOMY    . COLON SURGERY    . TENNIS ELBOW RELEASE/NIRSCHEL PROCEDURE Left   . TONSILLECTOMY      Prior to Admission medications   Medication Sig Start Date End Date Taking? Authorizing Provider  acetaminophen (TYLENOL) 500 MG tablet Take 1,000 mg by mouth every 6 (six) hours as needed for headache.     [provider]  aspirin EC 81 MG tablet Take 81 mg by mouth every morning.     [provider]  cephALEXin (KEFLEX) 500 MG capsule Take  1 capsule (500 mg total) by mouth 3 (three) times daily. 08/17/18   Harvest Dark, MD  cetirizine (ZYRTEC) 10 MG tablet Take 1 tablet (10 mg total) by mouth daily. Patient taking differently: Take 10 mg by mouth daily as needed for allergies.  12/22/16   Triplett, Johnette Abraham B, FNP  Cholecalciferol (VITAMIN D3) 5000 UNITS CAPS Take 5,000 Units by mouth daily.    [provider]  clonazePAM (KLONOPIN) 0.5 MG tablet Take 0.5 mg by mouth at bedtime as needed for anxiety.    [provider]  conjugated estrogens (PREMARIN) vaginal cream Place 1  Applicatorful vaginally daily. Mondays and Fridays    [provider]  diphenhydrAMINE (BENADRYL) 25 mg capsule Take 25 mg by mouth every 4 (four) hours as needed.    [provider]  Docusate Calcium (STOOL SOFTENER PO) Take 100 mg by mouth daily.     [provider]  esomeprazole (NEXIUM) 40 MG capsule Take 40 mg by mouth 2 (two) times daily.    [provider]  fluticasone (FLONASE) 50 MCG/ACT nasal spray Place 2 sprays into both nostrils daily as needed for allergies or rhinitis.    [provider]  metoprolol succinate (TOPROL-XL) 25 MG 24 hr tablet Take 25 mg by mouth daily. 08/01/18   [provider]  miconazole (MICOTIN) 2 % powder Apply 1 application topically as needed for itching.    [provider]  nitroGLYCERIN (NITROSTAT) 0.4 MG SL tablet Place 0.4 mg under the tongue every 5 (five) minutes as needed for chest pain.    [provider]  ondansetron (ZOFRAN ODT) 4 MG disintegrating tablet Take 1 tablet (4 mg total) by mouth every 8 (eight) hours as needed for nausea or vomiting. 08/17/18   Harvest Dark, MD  PARoxetine (PAXIL) 30 MG tablet Take 30 mg by mouth daily.    [provider]  potassium chloride SA (K-DUR,KLOR-CON) 20 MEQ tablet Take 20 mEq by mouth daily.    [provider]  pregabalin (LYRICA) 75 MG capsule Take 75 mg by mouth 3 (three) times daily. 2 tablet in the AM, and 2 tablets at 9:30pm    [provider]  rosuvastatin (CRESTOR) 20 MG tablet Take 20 mg by mouth at bedtime.     [provider]  sennosides-docusate sodium (SENOKOT-S) 8.6-50 MG tablet Take 1 tablet by mouth as needed.     [provider]  spironolactone (ALDACTONE) 25 MG tablet Take 0.5 tablets (12.5 mg total) by mouth daily. 11/14/17 11/14/18  Loletha Grayer, MD  torsemide (DEMADEX) 20 MG tablet Take 1 tablet (20 mg total) by mouth daily. 11/14/17 11/14/18  Loletha Grayer, MD  traZODone  (DESYREL) 50 MG tablet Take 50 mg by mouth at bedtime.    [provider]    Allergies Atorvastatin; Fluconazole; Sertraline; Sulfa antibiotics; Sulfamethoxazole-trimethoprim; and Sulfasalazine  Family History  Problem Relation Age of Onset  . Heart disease Other   . Hypertension Other   . Rheum arthritis Mother   . CAD Father     Social History Social History   Tobacco Use  . Smoking status: Never Smoker  . Smokeless tobacco: Never Used  Substance Use Topics  . Alcohol use: No    Alcohol/week: 0.0 standard drinks  . Drug use: No    Review of Systems Constitutional: No fever/chills Eyes: No visual changes. ENT: No sore throat. No stiff neck no neck pain Cardiovascular: Denies chest pain. Respiratory: Denies shortness of breath. Gastrointestinal:  no vomiting.  No diarrhea.  No constipation. Genitourinary: Negative for dysuria. Musculoskeletal: Negative lower extremity swelling Skin: Negative for rash. Neurological: Negative for severe headaches, focal weakness or numbness.   ____________________________________________   PHYSICAL EXAM:  VITAL SIGNS: ED Triage Vitals  Enc Vitals Group     BP 11/01/18 2200 (!) 87/67     Pulse Rate 11/01/18 2200 83     Resp 11/01/18 2200 20     Temp 11/01/18 2200 97.8 F (36.6 C)     Temp Source 11/01/18 2200 Oral     SpO2 11/01/18 2200 97 %     Weight 11/01/18 2153 200 lb 6.4 oz (90.9 kg)     Height --      Head Circumference --      Peak Flow --      Pain Score --      Pain Loc --      Pain Edu? --      Excl. in [place]? --     Constitutional: Caryl Pina quite somnolent but arouses to verbal and painful stimuli will tell me her name. Eyes: Conjunctivae are normal Head: Atraumatic HEENT: No congestion/rhinnorhea. Mucous membranes are moist.  Oropharynx non-erythematous Neck:   Nontender with no meningismus, no masses, no stridor Cardiovascular: Normal rate, regular rhythm. Grossly normal heart sounds.  Good  peripheral circulation. Respiratory: Normal respiratory effort.  No retractions. Lungs CTAB. Abdominal: Dressing is clean dry and intact I did take it down there is a midline incision with very slight serous looking drainage, abdomen is diffusely mildly tender but not surgical no distention. No guarding no rebound Back:  There is no focal tenderness or step off.  there is no midline tenderness there are no lesions noted. there is no CVA tenderness Musculoskeletal: No lower extremity tenderness, no upper extremity tenderness. No joint effusions, no DVT signs strong distal pulses no edema Neurologic:  Normal speech and language. No gross focal neurologic deficits are appreciated.  Skin:  Skin is warm, dry and intact. No rash noted. Psychiatric: Mood and affect are normal. Speech and behavior are normal.  ____________________________________________   LABS (all labs ordered are listed, but only abnormal results are displayed)  Labs Reviewed  COMPREHENSIVE METABOLIC PANEL - Abnormal; Notable for the following components:      Result Value   Sodium 131 (*)    Potassium 2.1 (*)    Chloride 94 (*)    Glucose, Bld 123 (*)    BUN 26 (*)    Creatinine, Ser 1.06 (*)    Calcium 8.1 (*)    Albumin 3.3 (*)    GFR calc non Af Amer 56 (*)    All other components within normal limits  CBC WITH DIFFERENTIAL/PLATELET - Abnormal; Notable for the following components:   RBC 2.86 (*)    Hemoglobin 8.5 (*)    HCT 24.9 (*)    Platelets 122 (*)    Abs Immature Granulocytes 0.16 (*)    All other components within normal limits  URINALYSIS, COMPLETE (UACMP) WITH MICROSCOPIC - Abnormal; Notable for the following components:   Color, Urine YELLOW (*)    APPearance HAZY (*)    All other components within normal limits  GLUCOSE, CAPILLARY - Abnormal; Notable for the following components:   Glucose-Capillary 104 (*)    All other components within normal limits  CULTURE, BLOOD (ROUTINE X 2)  CULTURE, BLOOD  (ROUTINE X 2)  URINE CULTURE  LACTIC ACID, PLASMA  PROCALCITONIN  TROPONIN I  LACTIC  ACID, PLASMA    Pertinent labs  results that were available during my care of the patient were reviewed by me and considered in my medical decision making (see chart for details). ____________________________________________  EKG  I personally interpreted any EKGs ordered by me or triage EKG is obtained x2, first EKG was limited because of artifact, it showed sinus rhythm rate 83 bpm no acute ST elevation or depression but significant baseline artifact limits exam.  New EKG showed sinus rhythm rate 83, better baseline no acute ischemia. ____________________________________________  RADIOLOGY  Pertinent labs & imaging results that were available during my care of the patient were reviewed by me and considered in my medical decision making (see chart for details). If possible, patient and/or family made aware of any abnormal findings.  No results found. ____________________________________________    PROCEDURES  Procedure(s) performed: None  Procedures  Critical Care performed: CRITICAL CARE Performed by: Schuyler Amor   Total critical care time: 55 minutes  Critical care time was exclusive of separately billable procedures and treating other patients.  Critical care was necessary to treat or prevent imminent or life-threatening deterioration.  Critical care was time spent personally by me on the following activities: development of treatment plan with patient and/or surrogate as well as nursing, discussions with consultants, evaluation of patient's response to treatment, examination of patient, obtaining history from patient or surrogate, ordering and performing treatments and interventions, ordering and review of laboratory studies, ordering and review of radiographic studies, pulse oximetry and re-evaluation of patient's  condition.   ____________________________________________   INITIAL IMPRESSION / ASSESSMENT AND PLAN / ED COURSE  Pertinent labs & imaging results that were available during my care of the patient were reviewed by me and considered in my medical decision making (see chart for details).  Patient afebrile here, but with recent significant surgical history a low blood pressure and less responsive.  No trauma.  I did a chest x-ray which to my read does not show any acute injury, her saturation is reassuring, her blood pressure is responding to fluids she does have a history of CHF so we will be gentle with hydration after the first bolus to get her pressure up.  I did give her some Narcan although her pupils were normal and does not seem that she had a decreased respiratory rate.  I am obtaining a CT scan of her abdomen and pelvis.  Presumptively, patient likely has some more further complications of her most recent surgeries and she is on chemotherapy so we did institute empiric antibiotics, covering belly bacteria.  CT scan is pending.  Patient is full code according to family.   ----------------------------------------- 11:00 PM on 11/01/2018 -----------------------------------------  Also CT scan are pending blood work is actually reassuring, her abdomen is nonsurgical but obviously given all of this recent infection etc. I did start empiric antibiotics.  Patient is somnolent but easily arousable to verbal and certainly to tactile stimuli.  Her map is 83 at this time and her lactic is negative her white counts reassuring and her sugar was normal we did give Narcan with minimal change in her basis.  Unclear what exactly is the underlying pathology of CT abdomen does not show anything acute, I will consider CT head.  Generally intracranial pathology does not cause profound hypotension however.  ----------------------------------------- 11:22 PM on  11/01/2018 -----------------------------------------  Ct pending, signed out to dr. Karma Greaser, also d/w dr Jannifer Franklin for admission.   ____________________________________________   FINAL CLINICAL IMPRESSION(S) / ED  DIAGNOSES  Final diagnoses:  None      This chart was dictated using voice recognition software.  Despite best efforts to proofread,  errors can occur which can change meaning.      Schuyler Amor, MD 11/01/18 2241    Schuyler Amor, MD 11/01/18 3299    Schuyler Amor, MD 11/01/18 [phone]

## 2018-11-01 NOTE — ED Triage Notes (Addendum)
Pt brought in via [facility] from home for possible cardiac arrest. Pt was not arresting on EMS arrival. Pt is somnolent and arousal to painful stimuli. Pt has snoring respirations but pulse O2 is maintaining at 96-98% on RA. McShane at bedside for evaluation and has decided to run as possible sepsis. Pt's BP is 80's. Pt recently d/c'd from [facility] after surgery for ovarian cancer. Pt has abdominal would that is dressed with an intact dressing.

## 2018-11-01 NOTE — Progress Notes (Signed)
   11/01/18 2200  Clinical Encounter Type  Visited With Patient not available;Health care provider  Visit Type Initial   Page received regarding patient's arrival via EMS. Upon arrival to the patient's room, the medical team was assessing her status and administering care. Chaplain maintained pastoral presence outside patient's room and offered silent prayer. EMS provided phone numbers for family (Danny/Husband - [phone]; son or son-in-law - [phone]).

## 2018-11-01 NOTE — Progress Notes (Signed)
CODE SEPSIS - PHARMACY COMMUNICATION  **Broad Spectrum Antibiotics should be administered within 1 hour of Sepsis diagnosis**  Time Code Sepsis Called/Page Received: @2147   Antibiotics Ordered: Cefepime, Vancomycin, Flagyl, Zosyn    Time of 1st antibiotic administration: @ 2200  Additional action taken by pharmacy: Messaged MD about duplicate therapy with Cefepime/Flagyl and Zosyn   If necessary, Name of Provider/Nurse Contacted: Dr. Madelin Headings, PharmD, BCPS Clinical Pharmacist 11/01/2018 10:12 PM

## 2018-11-02 DIAGNOSIS — C569 Malignant neoplasm of unspecified ovary: Secondary | ICD-10-CM | POA: Diagnosis present

## 2018-11-02 DIAGNOSIS — G8194 Hemiplegia, unspecified affecting left nondominant side: Secondary | ICD-10-CM | POA: Diagnosis not present

## 2018-11-02 DIAGNOSIS — I5042 Chronic combined systolic (congestive) and diastolic (congestive) heart failure: Secondary | ICD-10-CM | POA: Diagnosis present

## 2018-11-02 DIAGNOSIS — Z8544 Personal history of malignant neoplasm of other female genital organs: Secondary | ICD-10-CM | POA: Diagnosis not present

## 2018-11-02 DIAGNOSIS — K219 Gastro-esophageal reflux disease without esophagitis: Secondary | ICD-10-CM | POA: Diagnosis present

## 2018-11-02 DIAGNOSIS — E785 Hyperlipidemia, unspecified: Secondary | ICD-10-CM | POA: Diagnosis present

## 2018-11-02 DIAGNOSIS — A419 Sepsis, unspecified organism: Secondary | ICD-10-CM | POA: Diagnosis present

## 2018-11-02 DIAGNOSIS — G4730 Sleep apnea, unspecified: Secondary | ICD-10-CM | POA: Diagnosis present

## 2018-11-02 DIAGNOSIS — D61818 Other pancytopenia: Secondary | ICD-10-CM | POA: Diagnosis present

## 2018-11-02 DIAGNOSIS — R400 Somnolence: Secondary | ICD-10-CM | POA: Diagnosis present

## 2018-11-02 DIAGNOSIS — E876 Hypokalemia: Secondary | ICD-10-CM | POA: Diagnosis present

## 2018-11-02 DIAGNOSIS — N8110 Cystocele, unspecified: Secondary | ICD-10-CM | POA: Diagnosis present

## 2018-11-02 DIAGNOSIS — Z7982 Long term (current) use of aspirin: Secondary | ICD-10-CM | POA: Diagnosis not present

## 2018-11-02 DIAGNOSIS — I959 Hypotension, unspecified: Secondary | ICD-10-CM | POA: Diagnosis present

## 2018-11-02 DIAGNOSIS — I2510 Atherosclerotic heart disease of native coronary artery without angina pectoris: Secondary | ICD-10-CM | POA: Diagnosis present

## 2018-11-02 DIAGNOSIS — I110 Hypertensive heart disease with heart failure: Secondary | ICD-10-CM | POA: Diagnosis present

## 2018-11-02 DIAGNOSIS — Z20828 Contact with and (suspected) exposure to other viral communicable diseases: Secondary | ICD-10-CM | POA: Diagnosis present

## 2018-11-02 DIAGNOSIS — R32 Unspecified urinary incontinence: Secondary | ICD-10-CM | POA: Diagnosis present

## 2018-11-02 DIAGNOSIS — I6381 Other cerebral infarction due to occlusion or stenosis of small artery: Secondary | ICD-10-CM | POA: Diagnosis not present

## 2018-11-02 DIAGNOSIS — R471 Dysarthria and anarthria: Secondary | ICD-10-CM | POA: Diagnosis not present

## 2018-11-02 DIAGNOSIS — J189 Pneumonia, unspecified organism: Secondary | ICD-10-CM | POA: Diagnosis present

## 2018-11-02 DIAGNOSIS — R29707 NIHSS score 7: Secondary | ICD-10-CM | POA: Diagnosis not present

## 2018-11-02 DIAGNOSIS — Z8249 Family history of ischemic heart disease and other diseases of the circulatory system: Secondary | ICD-10-CM | POA: Diagnosis not present

## 2018-11-02 DIAGNOSIS — Z8673 Personal history of transient ischemic attack (TIA), and cerebral infarction without residual deficits: Secondary | ICD-10-CM | POA: Diagnosis not present

## 2018-11-02 DIAGNOSIS — R29810 Facial weakness: Secondary | ICD-10-CM | POA: Diagnosis not present

## 2018-11-02 LAB — BASIC METABOLIC PANEL
Anion gap: 9 (ref 5–15)
BUN: 21 mg/dL (ref 8–23)
CO2: 24 mmol/L (ref 22–32)
Calcium: 7.8 mg/dL — ABNORMAL LOW (ref 8.9–10.3)
Chloride: 103 mmol/L (ref 98–111)
Creatinine, Ser: 0.96 mg/dL (ref 0.44–1.00)
GFR calc Af Amer: >60 mL/min (ref >60)
GFR calc non Af Amer: >60 mL/min (ref >60)
Glucose, Bld: 127 mg/dL — ABNORMAL HIGH (ref 70–99)
Potassium: 2.3 mmol/L — CL (ref 3.5–5.1)
Sodium: 136 mmol/L (ref 135–145)

## 2018-11-02 LAB — POTASSIUM: Potassium: 2.8 mmol/L — ABNORMAL LOW (ref 3.5–5.1)

## 2018-11-02 LAB — AMMONIA: Ammonia: 10 umol/L (ref 9–35)

## 2018-11-02 LAB — CBC
HCT: 22.7 % — ABNORMAL LOW (ref 36.0–46.0)
Hemoglobin: 7.5 g/dL — ABNORMAL LOW (ref 12.0–15.0)
MCH: 29.1 pg (ref 26.0–34.0)
MCHC: 33 g/dL (ref 30.0–36.0)
MCV: 88 fL (ref 80.0–100.0)
Platelets: 96 10*3/uL — ABNORMAL LOW (ref 150–400)
RBC: 2.58 MIL/uL — ABNORMAL LOW (ref 3.87–5.11)
RDW: 15.5 % (ref 11.5–15.5)
WBC: 7.5 10*3/uL (ref 4.0–10.5)
nRBC: 0 % (ref 0.0–0.2)

## 2018-11-02 LAB — BLOOD GAS, ARTERIAL
Acid-Base Excess: 0.7 mmol/L (ref 0.0–2.0)
Bicarbonate: 24.3 mmol/L (ref 20.0–28.0)
FIO2: 0.28
O2 Saturation: 99.4 %
Patient temperature: 37
pCO2 arterial: 35 mmHg (ref 32.0–48.0)
pH, Arterial: 7.45 (ref 7.350–7.450)
pO2, Arterial: 150 mmHg — ABNORMAL HIGH (ref 83.0–108.0)

## 2018-11-02 LAB — MRSA PCR SCREENING: MRSA by PCR: NEGATIVE

## 2018-11-02 LAB — PREPARE RBC (CROSSMATCH)

## 2018-11-02 LAB — ABO/RH: ABO/RH(D): A POS

## 2018-11-02 MED ORDER — SODIUM CHLORIDE 0.9 % IV SOLN
2.0000 g | Freq: Three times a day (TID) | INTRAVENOUS | Status: DC
  Administered 2018-11-02 – 2018-11-07 (×15): 2 g via INTRAVENOUS
  Filled 2018-11-02 (×18): qty 2

## 2018-11-02 MED ORDER — ONDANSETRON HCL 4 MG/2ML IJ SOLN: 4.0000 mg | Freq: Four times a day (QID) | INTRAMUSCULAR | Status: DC | PRN

## 2018-11-02 MED ORDER — SODIUM CHLORIDE 0.9 % IV SOLN
INTRAVENOUS | Status: DC
  Administered 2018-11-02: 05:00:00 via INTRAVENOUS

## 2018-11-02 MED ORDER — ACETAMINOPHEN 325 MG PO TABS
650.0000 mg | ORAL_TABLET | Freq: Four times a day (QID) | ORAL | Status: DC | PRN
  Administered 2018-11-03 – 2018-11-06 (×2): 650 mg via ORAL
  Filled 2018-11-02 (×2): qty 2

## 2018-11-02 MED ORDER — ENOXAPARIN SODIUM 40 MG/0.4ML ~~LOC~~ SOLN
40.0000 mg | SUBCUTANEOUS | Status: DC
  Administered 2018-11-02 – 2018-11-04 (×3): 40 mg via SUBCUTANEOUS
  Filled 2018-11-02 (×3): qty 0.4

## 2018-11-02 MED ORDER — SODIUM CHLORIDE 0.9% IV SOLUTION
Freq: Once | INTRAVENOUS | Status: AC
  Administered 2018-11-02: 13:00:00 via INTRAVENOUS

## 2018-11-02 MED ORDER — ONDANSETRON HCL 4 MG PO TABS
4.0000 mg | ORAL_TABLET | Freq: Four times a day (QID) | ORAL | Status: DC | PRN
  Administered 2018-11-03: 09:00:00 4 mg via ORAL
  Filled 2018-11-02: qty 1

## 2018-11-02 MED ORDER — MAGNESIUM SULFATE 4 GM/100ML IV SOLN
4.0000 g | Freq: Once | INTRAVENOUS | Status: AC
  Administered 2018-11-02: 4 g via INTRAVENOUS
  Filled 2018-11-02: qty 100

## 2018-11-02 MED ORDER — ACETAMINOPHEN 650 MG RE SUPP: 650.0000 mg | Freq: Four times a day (QID) | RECTAL | Status: DC | PRN

## 2018-11-02 MED ORDER — POTASSIUM CHLORIDE 2 MEQ/ML IV SOLN
INTRAVENOUS | Status: DC
  Filled 2018-11-02 (×2): qty 1000

## 2018-11-02 MED ORDER — VANCOMYCIN HCL IN DEXTROSE 1-5 GM/200ML-% IV SOLN
1000.0000 mg | INTRAVENOUS | Status: DC
  Administered 2018-11-02: 1000 mg via INTRAVENOUS
  Filled 2018-11-02 (×2): qty 200

## 2018-11-02 MED ORDER — KCL IN DEXTROSE-NACL 40-5-0.45 MEQ/L-%-% IV SOLN
INTRAVENOUS | Status: DC
  Administered 2018-11-02 – 2018-11-04 (×3): via INTRAVENOUS
  Filled 2018-11-02 (×7): qty 1000

## 2018-11-02 MED ORDER — POTASSIUM CHLORIDE CRYS ER 20 MEQ PO TBCR
60.0000 meq | EXTENDED_RELEASE_TABLET | Freq: Once | ORAL | Status: AC
  Administered 2018-11-02: 60 meq via ORAL
  Filled 2018-11-02: qty 3

## 2018-11-02 MED ORDER — POTASSIUM CHLORIDE 10 MEQ/100ML IV SOLN
10.0000 meq | INTRAVENOUS | Status: AC
  Administered 2018-11-02 (×5): 10 meq via INTRAVENOUS
  Filled 2018-11-02 (×8): qty 100

## 2018-11-02 MED ORDER — ACETAMINOPHEN 325 MG PO TABS
650.0000 mg | ORAL_TABLET | Freq: Once | ORAL | Status: AC
  Administered 2018-11-02: 14:00:00 650 mg via ORAL
  Filled 2018-11-02: qty 2

## 2018-11-02 MED ORDER — SODIUM CHLORIDE 0.9 % IV SOLN
2.0000 g | Freq: Two times a day (BID) | INTRAVENOUS | Status: DC
  Administered 2018-11-02: 10:00:00 2 g via INTRAVENOUS
  Filled 2018-11-02 (×2): qty 2

## 2018-11-02 NOTE — Consult Note (Signed)
Pharmacy Antibiotic Note  Destiny [name] is a 63 y.o. female admitted on 11/01/2018 with sepsis.  Pharmacy has been consulted for Cefepime and Vancomycin dosing.  Plan: Vancomycin 2000mg  IV loading dose x1. Followed by Vancomycin 1000 mg IV Q 24hrs. Goal AUC 400-550. Expected AUC: 549 SCr used: 1.06  Start cefepime 2g IV every 12 hours  Weight: 200 lb 6.4 oz (90.9 kg)  Temp (24hrs), Avg:97.8 F (36.6 C), Min:97.8 F (36.6 C), Max:97.8 F (36.6 C)  Recent Labs  Lab 11/01/18 2145  WBC 10.2  CREATININE 1.06*  LATICACIDVEN 1.2    Estimated Creatinine Clearance: 56.9 mL/min (A) (by C-G formula based on SCr of 1.06 mg/dL (H)).    Allergies  Allergen Reactions  . Atorvastatin Hives    No s/sx of anaphylaxis, just intermittent hives.  Does not seem to be a class effect as she has tolerated lovastatin previously No s/sx of anaphylaxis, just intermittent hives.  Does not seem to be a class effect as she has tolerated lovastatin previously  . Fluconazole Rash    Reaction to generic but not name brand Reaction to generic but not name brand CAN USE DIFLUCAN.DOES NOT BREAK OUT WITH DIFLUCAN.(allergic only to generic brand)  . Sertraline Itching  . Sulfa Antibiotics Rash and Other (See Comments)    "hot " sensation  . Sulfamethoxazole-Trimethoprim Rash and Other (See Comments)    "felt hot" Other reaction(s): Other (See Comments) "felt hot"  . Sulfasalazine Other (See Comments) and Rash    "hot " sensation    Antimicrobials this admission: 4/16 Zosyn x1 4/16 Flagyl x1 4/17 vancomycin >>  4/17 cefepime >>   Microbiology results: 4/17 BCx: pending  4/17  UCx: pending  Thank you for allowing pharmacy to be a part of this patient's care.  Pernell Dupre, PharmD, BCPS Clinical Pharmacist 11/02/2018 1:26 AM

## 2018-11-02 NOTE — ED Notes (Signed)
Patient transported to CT 

## 2018-11-02 NOTE — Progress Notes (Signed)
Hypotension -unclear etiology.  Patient does not seem to be dehydrated.  She does not really meet sepsis criteria, though her procalcitonin was intermediate.  If she is infected it is unclear what her source is.  Based on imaging perhaps pneumonia.  Her blood pressure has responded some to IV fluids, we will keep IV fluids in place for now.  We will continue antibiotics for now. Active Problems:   Hypokalemia -replace and monitor.  Magnesium was also low, will replace this as well   Somnolence -potentially due to her hypotension, related to whatever the same underlying causes.  She does have a history of sleep apnea and so we will check an ABG at this time given that she was laying around sleeping all day.  If cause is not elucidated and she has not improved by morning she will likely need a neurology consult, and also MRI imaging of her brain for the possibility of stroke.  She does have a history of stroke in the past   Chronic diastolic heart failure ([place]) -last echocardiogram showed normal EF and did not comment on diastolic dysfunction.  We will be cautious with her IV fluids, and continue home meds when she is awake enough to take p.o.   CAD (coronary artery disease) -continue home meds when she is able to take p.o.   HLD (hyperlipidemia) -home dose antilipid when she is alert enough to take p.o.   Sleep apnea -checking ABG as above, if her CO2 is elevated we will use BiPAP  Patient's husband/patient's son requesting the patient be transferred to [facility], [facility] transfer center was contacted-they refused to accept the patient in transfer, the patient was notified of [facility]'s decision

## 2018-11-02 NOTE — Progress Notes (Signed)
Pt more alert no resp distress 02 2l Emma. Acute. Received 1 unit prbcs and tol well. Negative for mrsa. From test today. Po and iv k given. Now on ivfs  With k.

## 2018-11-02 NOTE — Plan of Care (Signed)
  Problem: Activity: Goal: Ability to tolerate increased activity will improve Outcome: Progressing   Problem: Respiratory: Goal: Ability to maintain adequate ventilation will improve Outcome: Progressing   

## 2018-11-02 NOTE — ED Notes (Signed)
ED TO INPATIENT HANDOFF REPORT  ED Nurse Name and Phone #: Gwynn Burly Name/Age/Gender Destiny [name] 63 y.o. female Room/Bed: ED08A/ED08A  Code Status   Code Status: Prior  Home/SNF/Other Nursing Home Patient oriented to: self Is this baseline? Yes   Triage Complete: Triage complete  Chief Complaint cardiac arrest  Triage Note Pt brought in via [facility] from home for possible cardiac arrest. Pt was not arresting on EMS arrival. Pt is somnolent and arousal to painful stimuli. Pt has snoring respirations but pulse O2 is maintaining at 96-98% on RA. McShane at bedside for evaluation and has decided to run as possible sepsis. Pt's BP is 80's. Pt recently d/c'd from [facility] after surgery for ovarian cancer. Pt has abdominal would that is dressed with an intact dressing.    Allergies Allergies  Allergen Reactions  . Atorvastatin Hives    No s/sx of anaphylaxis, just intermittent hives.  Does not seem to be a class effect as she has tolerated lovastatin previously No s/sx of anaphylaxis, just intermittent hives.  Does not seem to be a class effect as she has tolerated lovastatin previously  . Fluconazole Rash    Reaction to generic but not name brand Reaction to generic but not name brand CAN USE DIFLUCAN.DOES NOT BREAK OUT WITH DIFLUCAN.(allergic only to generic brand)  . Sertraline Itching  . Sulfa Antibiotics Rash and Other (See Comments)    "hot " sensation  . Sulfamethoxazole-Trimethoprim Rash and Other (See Comments)    "felt hot" Other reaction(s): Other (See Comments) "felt hot"  . Sulfasalazine Other (See Comments) and Rash    "hot " sensation    Level of Care/Admitting Diagnosis ED Disposition    ED Disposition Condition [facility] Area: [place] [[id number]]  Level of Care: Med-Surg [16]  Diagnosis: Hypotension [419379]  Admitting Physician: Lance Coon [[id number]]  Attending Physician: Lance Coon [[id number]]  PT  Class (Do Not Modify): Observation [104]  PT Acc Code (Do Not Modify): Observation [10022]       B Medical/Surgery History Past Medical History:  Diagnosis Date  . CAD (coronary artery disease)   . CHF (congestive heart failure) ([place])   . CVA (cerebral infarction)   . Female bladder prolapse   . GERD (gastroesophageal reflux disease)   . Hyperlipidemia   . Hypertension   . Hypokalemia   . Mitral valve disease   . Ovarian ca ([place])   . Sleep apnea   . Stroke [facility])    x3   Past Surgical History:  Procedure Laterality Date  . CARPAL TUNNEL RELEASE Bilateral   . CATARACT EXTRACTION Right 11/22/2017  . CHOLECYSTECTOMY    . COLON SURGERY    . TENNIS ELBOW RELEASE/NIRSCHEL PROCEDURE Left   . TONSILLECTOMY       A IV Location/Drains/Wounds Patient Lines/Drains/Airways Status   Active Line/Drains/Airways    Name:   Placement date:   Placement time:   Site:   Days:   Peripheral IV 11/01/18 Right Forearm   11/01/18    2140    Forearm   1   Peripheral IV 11/01/18 Left Forearm   11/01/18    2152    Forearm   1   Urethral Catheter Lisa, NT Straight-tip 14 Fr.   11/01/18    2226    Straight-tip   1          Intake/Output Last 24 hours  Intake/Output Summary (Last 24 hours) at 11/02/2018 0117  Last data filed at 11/01/2018 2227 Gross per 24 hour  Intake 500 ml  Output 550 ml  Net -50 ml    Labs/Imaging Results for orders placed or performed during the hospital encounter of 11/01/18 (from the past 48 hour(s))  Lactic acid, plasma     Status: None   Collection Time: 11/01/18  9:45 PM  Result Value Ref Range   Lactic Acid, Venous 1.2 0.5 - 1.9 mmol/L    Comment: Performed at [facility], [address] [place], [place] [address]  Comprehensive metabolic panel     Status: Abnormal   Collection Time: 11/01/18  9:45 PM  Result Value Ref Range   Sodium 131 (L) 135 - 145 mmol/L   Potassium 2.1 (LL) 3.5 - 5.1 mmol/L    Comment: CRITICAL RESULT CALLED TO, READ BACK  BY AND VERIFIED WITH SUSAN NEAS AT 2231 11/01/2018 SMA    Chloride 94 (L) 98 - 111 mmol/L   CO2 23 22 - 32 mmol/L   Glucose, Bld 123 (H) 70 - 99 mg/dL   BUN 26 (H) 8 - 23 mg/dL   Creatinine, Ser 1.06 (H) 0.44 - 1.00 mg/dL   Calcium 8.1 (L) 8.9 - 10.3 mg/dL   Total Protein 7.1 6.5 - 8.1 g/dL   Albumin 3.3 (L) 3.5 - 5.0 g/dL   AST 24 15 - 41 U/L   ALT 27 0 - 44 U/L   Alkaline Phosphatase 75 38 - 126 U/L   Total Bilirubin 1.2 0.3 - 1.2 mg/dL   GFR calc non Af Amer 56 (L) >60 mL/min   GFR calc Af Amer >60 >60 mL/min   Anion gap 14 5 - 15    Comment: Performed at [facility], [address] [place], [place] [address]  CBC WITH DIFFERENTIAL     Status: Abnormal   Collection Time: 11/01/18  9:45 PM  Result Value Ref Range   WBC 10.2 4.0 - 10.5 K/uL   RBC 2.86 (L) 3.87 - 5.11 MIL/uL   Hemoglobin 8.5 (L) 12.0 - 15.0 g/dL   HCT 24.9 (L) 36.0 - 46.0 %   MCV 87.1 80.0 - 100.0 fL   MCH 29.7 26.0 - 34.0 pg   MCHC 34.1 30.0 - 36.0 g/dL   RDW 15.4 11.5 - 15.5 %   Platelets 122 (L) 150 - 400 K/uL   nRBC 0.0 0.0 - 0.2 %   Neutrophils Relative % 74 %   Neutro Abs 7.6 1.7 - 7.7 K/uL   Lymphocytes Relative 14 %   Lymphs Abs 1.4 0.7 - 4.0 K/uL   Monocytes Relative 9 %   Monocytes Absolute 1.0 0.1 - 1.0 K/uL   Eosinophils Relative 0 %   Eosinophils Absolute 0.0 0.0 - 0.5 K/uL   Basophils Relative 1 %   Basophils Absolute 0.1 0.0 - 0.1 K/uL   Immature Granulocytes 2 %   Abs Immature Granulocytes 0.16 (H) 0.00 - 0.07 K/uL    Comment: Performed at [facility], [address] [place], [place] [address]  Procalcitonin     Status: None   Collection Time: 11/01/18  9:45 PM  Result Value Ref Range   Procalcitonin 0.54 ng/mL    Comment:        Interpretation: PCT > 0.5 ng/mL and <= 2 ng/mL: Systemic infection (sepsis) is possible, but other conditions are known to elevate PCT as well. (NOTE)       Sepsis PCT Algorithm  Lower Respiratory Tract                                       Infection PCT Algorithm    ----------------------------     ----------------------------         PCT < 0.25 ng/mL                PCT < 0.10 ng/mL         Strongly encourage             Strongly discourage   discontinuation of antibiotics    initiation of antibiotics    ----------------------------     -----------------------------       PCT 0.25 - 0.50 ng/mL            PCT 0.10 - 0.25 ng/mL               OR       >80% decrease in PCT            Discourage initiation of                                            antibiotics      Encourage discontinuation           of antibiotics    ----------------------------     -----------------------------         PCT >= 0.50 ng/mL              PCT 0.26 - 0.50 ng/mL                AND       <80% decrease in PCT             Encourage initiation of                                             antibiotics       Encourage continuation           of antibiotics    ----------------------------     -----------------------------        PCT >= 0.50 ng/mL                  PCT > 0.50 ng/mL               AND         increase in PCT                  Strongly encourage                                      initiation of antibiotics    Strongly encourage escalation           of antibiotics                                     -----------------------------  PCT <= 0.25 ng/mL                                                 OR                                        > 80% decrease in PCT                                     Discontinue / Do not initiate                                             antibiotics Performed at [facility], [address] [place], [place] [address]   Urinalysis, Complete w Microscopic     Status: Abnormal   Collection Time: 11/01/18  9:45 PM  Result Value Ref Range   Color, Urine YELLOW (A) YELLOW   APPearance HAZY (A) CLEAR   Specific Gravity, Urine 1.008 1.005 -  1.030   pH 5.0 5.0 - 8.0   Glucose, UA NEGATIVE NEGATIVE mg/dL   Hgb urine dipstick NEGATIVE NEGATIVE   Bilirubin Urine NEGATIVE NEGATIVE   Ketones, ur NEGATIVE NEGATIVE mg/dL   Protein, ur NEGATIVE NEGATIVE mg/dL   Nitrite NEGATIVE NEGATIVE   Leukocytes,Ua NEGATIVE NEGATIVE   RBC / HPF 0-5 0 - 5 RBC/hpf   WBC, UA 0-5 0 - 5 WBC/hpf   Bacteria, UA NONE SEEN NONE SEEN   Squamous Epithelial / LPF NONE SEEN 0 - 5   Mucus PRESENT    Budding Yeast PRESENT    Hyaline Casts, UA PRESENT     Comment: Performed at [facility], [address] [place], [place] [address]  Troponin I - ONCE - STAT     Status: None   Collection Time: 11/01/18  9:45 PM  Result Value Ref Range   Troponin I <0.03 <0.03 ng/mL    Comment: Performed at [facility], [address] [place], [place] [address]  Magnesium     Status: Abnormal   Collection Time: 11/01/18  9:45 PM  Result Value Ref Range   Magnesium 1.6 (L) 1.7 - 2.4 mg/dL    Comment: Performed at [facility], [address] [place], [place] [address]  Glucose, capillary     Status: Abnormal   Collection Time: 11/01/18 10:04 PM  Result Value Ref Range   Glucose-Capillary 104 (H) 70 - 99 mg/dL   Ct Abdomen Pelvis Wo Contrast  Result Date: 11/01/2018 CLINICAL DATA:  Possible cardiac arrest/hypotension, history of ovarian carcinoma EXAM: CT ABDOMEN AND PELVIS WITHOUT CONTRAST TECHNIQUE: Multidetector CT imaging of the abdomen and pelvis was performed following the standard protocol without IV contrast. COMPARISON:  10/16/2018 FINDINGS: Lower chest: Mild right basilar atelectasis is noted. Stable right-sided pleural effusion is seen. Hepatobiliary: No focal liver abnormality is seen. Status post cholecystectomy. No biliary dilatation. Pancreas: Unremarkable. No pancreatic ductal dilatation or surrounding inflammatory changes. Spleen: Normal in size without focal abnormality. Adrenals/Urinary Tract: Adrenal glands are within  normal limits. Kidneys are well visualized without obstructive  change. Scattered small left renal calculi are noted better appreciated on the noncontrast study. The bladder is decompressed by Foley catheter. Stomach/Bowel: Colon is predominately decompressed. No obstructive changes are seen. The appendix is not well visualized. No inflammatory changes to suggest appendicitis are seen. No small bowel obstructive changes are noted. Stomach is decompressed. Vascular/Lymphatic: Aortic atherosclerosis. No enlarged abdominal or pelvic lymph nodes. Reproductive: Uterus is stable in appearance. The ovaries are not visualized consistent with the recent surgical history. Other: Some mild peritoneal thickening is noted consistent with the patient's known history of omental disease. No free fluid is seen. Postsurgical changes are noted anteriorly. The degree of fluid along the surgical wound has decreased. Musculoskeletal: Degenerative changes of the lumbar spine are noted. No lytic or sclerotic lesions are seen. IMPRESSION: Stable right pleural effusion and right basilar atelectasis. Postoperative changes. 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 No acute abnormality is noted. Electronically Signed   By: Inez Catalina M.D.   On: 11/01/2018 23:42   Ct Head Wo Contrast  Result Date: 11/02/2018 CLINICAL DATA:  Increased somnolence EXAM: CT HEAD WITHOUT CONTRAST TECHNIQUE: Contiguous axial images were obtained from the base of the skull through the vertex without intravenous contrast. COMPARISON:  11/13/2017 FINDINGS: Brain: Mild atrophic changes are noted. Stable chronic white matter ischemic changes are seen. Areas of prior frontal and parietal infarcts on the left are noted and stable. Dilated perivascular space in the left lentiform nucleus is again seen and stable. No findings to suggest acute hemorrhage, acute infarction or space-occupying mass lesion are noted. Cavum septum  pellucidum is again seen. Vascular: No hyperdense vessel or unexpected calcification. Skull: Normal. Negative for fracture or focal lesion. Sinuses/Orbits: No acute finding. Other: None. IMPRESSION: Chronic changes without acute abnormality. Electronically Signed   By: Inez Catalina M.D.   On: 11/02/2018 00:25   Ct Chest Wo Contrast  Result Date: 11/02/2018 CLINICAL DATA:  History of ovarian carcinoma and hypotension EXAM: CT CHEST WITHOUT CONTRAST TECHNIQUE: Multidetector CT imaging of the chest was performed following the standard protocol without IV contrast. COMPARISON:  Chest x-ray from earlier in the same day, CT from 11/13/2017 FINDINGS: Cardiovascular: Thoracic aorta shows atherosclerotic calcifications without aneurysmal dilatation. No cardiac enlargement is noted. Coronary calcifications are seen. The pulmonary artery is within normal limits. A few small air bubbles are noted within the main pulmonary artery elated to the IV start. Mediastinum/Nodes: Nodular changes in the thyroid are again noted stable from the prior exam consistent with underlying goiter. No significant hilar or mediastinal adenopathy is noted. The esophagus as visualized is within normal limits. Lungs/Pleura: Mild left basilar atelectatic changes are noted. Right-sided pleural effusion is noted with some patchy basilar infiltrate/atelectasis. No significant nodularity is noted. Upper Abdomen: Within normal limits as previously seen on CT of the abdomen. Musculoskeletal: Degenerative changes of the thoracic spine are noted. No acute bony abnormality is noted. IMPRESSION: Stable nodular changes within the thyroid consistent with goiter. Right-sided pleural effusion with underlying atelectasis/infiltrate. Mild left basilar atelectasis is noted as well. Aortic Atherosclerosis (ICD10-I70.0). Electronically Signed   By: Inez Catalina M.D.   On: 11/02/2018 00:28   Dg Chest Port 1 View  Result Date: 11/01/2018 CLINICAL DATA:  63 y/o  F;  loss of consciousness. EXAM: PORTABLE CHEST 1 VIEW COMPARISON:  08/17/2018 chest radiograph FINDINGS: Given positioning the lung apices are obscured by the patient's face and neck. Stable cardiac silhouette given projection and technique. Right port catheter tip projects  over cavoatrial junction. Blunted costal diaphragmatic angles, likely small effusions. Pulmonary reticular opacities. No pneumothorax. No acute osseous abnormality is evident. IMPRESSION: Blunted costal diaphragmatic angles, likely small effusions. Pulmonary reticular opacities may represent interstitial edema. Electronically Signed   By: Kristine Garbe M.D.   On: 11/01/2018 22:47    Pending Labs Unresulted Labs (From admission, onward)    Start     Ordered   11/02/18 0052  Blood gas, arterial  ONCE - STAT,   STAT     11/02/18 0051   11/01/18 2144  Lactic acid, plasma  STAT Now then every 3 hours,   STAT     11/01/18 2143   11/01/18 2144  Blood Culture (routine x 2)  BLOOD CULTURE X 2,   STAT     11/01/18 2143   11/01/18 2144  Urine culture  ONCE - STAT,   STAT     11/01/18 2143   Signed and Held  HIV antibody (Routine Testing)  Once,   R     Signed and Held   Signed and Held  CBC  (enoxaparin (LOVENOX)    CrCl >/= 30 ml/min)  Once,   R    Comments:  Baseline for enoxaparin therapy IF NOT ALREADY DRAWN.  Notify MD if PLT < 100 K.    Signed and Held   Signed and Held  Creatinine, serum  (enoxaparin (LOVENOX)    CrCl >/= 30 ml/min)  Once,   R    Comments:  Baseline for enoxaparin therapy IF NOT ALREADY DRAWN.    Signed and Held   Signed and Held  Creatinine, serum  (enoxaparin (LOVENOX)    CrCl >/= 30 ml/min)  Weekly,   R    Comments:  while on enoxaparin therapy    Signed and Held   Signed and Held  Basic metabolic panel  Tomorrow morning,   R     Signed and Held   Signed and Held  CBC  Tomorrow morning,   R     Signed and Held          Vitals/Pain Today's Vitals   11/01/18 2153 11/01/18 2200 11/01/18  2248 11/01/18 2300  BP:  (!) 87/67 93/60 (!) 88/67  Pulse:  83 79 78  Resp:  20 19 20   Temp:  97.8 F (36.6 C)    TempSrc:  Oral    SpO2:  97% 100% 100%  Weight: 90.9 kg       Isolation Precautions No active isolations  Medications Medications  potassium chloride 10 mEq in 100 mL IVPB (has no administration in time range)  magnesium sulfate IVPB 4 g 100 mL (has no administration in time range)  ceFEPIme (MAXIPIME) 2 g in sodium chloride 0.9 % 100 mL IVPB (has no administration in time range)  sodium chloride 0.9 % bolus 1,000 mL (0 mLs Intravenous Stopped 11/01/18 2252)  piperacillin-tazobactam (ZOSYN) IVPB 3.375 g (0 g Intravenous Stopped 11/01/18 2248)  ceFEPIme (MAXIPIME) 2 g in sodium chloride 0.9 % 100 mL IVPB (0 g Intravenous Stopped 11/01/18 2248)  metroNIDAZOLE (FLAGYL) IVPB 500 mg (0 mg Intravenous Stopped 11/02/18 0017)  vancomycin (VANCOCIN) 2,000 mg in sodium chloride 0.9 % 500 mL IVPB (0 mg Intravenous Stopped 11/02/18 0116)  naloxone (NARCAN) injection 0.4 mg (0.4 mg Intravenous Given 11/01/18 2250)  potassium chloride 10 mEq in 100 mL IVPB (0 mEq Intravenous Stopped 11/02/18 0116)  sodium chloride 0.9 % bolus 500 mL (0 mLs Intravenous Stopped 11/02/18 0017)  Mobility walks with device     Focused Assessments    R Recommendations: See Admitting Provider Note  Report given to:   Additional Notes: none

## 2018-11-02 NOTE — H&P (Signed)
Destiny [name]    MR#:  [id number]  DATE OF BIRTH:  06-Mar-1956  DATE OF ADMISSION:  11/01/2018  PRIMARY CARE PHYSICIAN: [name], MD   REQUESTING/REFERRING PHYSICIAN: Burlene Arnt, MD  CHIEF COMPLAINT:   Chief Complaint  Patient presents with  . Blood Infection    HISTORY OF PRESENT ILLNESS:  Destiny [name]  is a 63 y.o. female who presents with chief complaint as above.  Patient is very somnolent and is unable to contribute information to her HPI.  Per family's report she was brought to the hospital today due to increased somnolence.  She had been laying around on the sofa most of the day and not really waking up for them.  Initially they thought she just needed some extra rest.  She is a cancer patient, with ovarian cancer.  However, when she did not wake up even after some time resting they decided to bring her to the ED.  Here she is found to be hypotensive and hypokalemic.  She is arousable, but remains somnolent and does not converse.  Work-up in the ED is initially unrevealing.  She has some persistent hypotension, though it seems like her baseline blood pressure is normally low and normotensive.  Hospitalist were called for admission and further evaluation  PAST MEDICAL HISTORY:   Past Medical History:  Diagnosis Date  . CAD (coronary artery disease)   . CHF (congestive heart failure) ([place])   . CVA (cerebral infarction)   . Female bladder prolapse   . GERD (gastroesophageal reflux disease)   . Hyperlipidemia   . Hypertension   . Hypokalemia   . Mitral valve disease   . Ovarian ca ([place])   . Sleep apnea   . Stroke [facility])    x3     PAST SURGICAL HISTORY:   Past Surgical History:  Procedure Laterality Date  . CARPAL TUNNEL RELEASE Bilateral   . CATARACT EXTRACTION Right 11/22/2017  . CHOLECYSTECTOMY    . COLON SURGERY    . TENNIS ELBOW RELEASE/NIRSCHEL PROCEDURE Left   . TONSILLECTOMY        SOCIAL HISTORY:   Social History   Tobacco Use  . Smoking status: Never Smoker  . Smokeless tobacco: Never Used  Substance Use Topics  . Alcohol use: No    Alcohol/week: 0.0 standard drinks     FAMILY HISTORY:   Family History  Problem Relation Age of Onset  . Heart disease Other   . Hypertension Other   . Rheum arthritis Mother   . CAD Father      DRUG ALLERGIES:   Allergies  Allergen Reactions  . Atorvastatin Hives    No s/sx of anaphylaxis, just intermittent hives.  Does not seem to be a class effect as she has tolerated lovastatin previously No s/sx of anaphylaxis, just intermittent hives.  Does not seem to be a class effect as she has tolerated lovastatin previously  . Fluconazole Rash    Reaction to generic but not name brand Reaction to generic but not name brand CAN USE DIFLUCAN.DOES NOT BREAK OUT WITH DIFLUCAN.(allergic only to generic brand)  . Sertraline Itching  . Sulfa Antibiotics Rash and Other (See Comments)    "hot " sensation  . Sulfamethoxazole-Trimethoprim Rash and Other (See Comments)    "felt hot" Other reaction(s): Other (See Comments) "felt hot"  . Sulfasalazine Other (See Comments) and Rash    "hot " sensation    MEDICATIONS  AT HOME:   Prior to Admission medications   Medication Sig Start Date End Date Taking? Authorizing Provider  acetaminophen (TYLENOL) 500 MG tablet Take 1,000 mg by mouth every 8 (eight) hours as needed for mild pain or headache.    Yes [provider]  albuterol (PROVENTIL) (2.5 MG/3ML) 0.083% nebulizer solution Take 2.5 mg by nebulization every 6 (six) hours as needed for wheezing.   Yes [provider]  aspirin 81 MG chewable tablet Chew 81 mg by mouth daily.   Yes [provider]  cetirizine (ZYRTEC) 10 MG tablet Take 1 tablet (10 mg total) by mouth daily. Patient taking differently: Take 10 mg by mouth daily as needed for allergies.  12/22/16  Yes Triplett, Cari B, FNP   Cholecalciferol (VITAMIN D3) 5000 UNITS CAPS Take 5,000 Units by mouth daily.   Yes [provider]  enoxaparin (LOVENOX) 40 MG/0.4ML injection Inject 40 mg into the skin daily.   Yes [provider]  hydrOXYzine (ATARAX/VISTARIL) 25 MG tablet Take 25 mg by mouth 2 (two) times daily as needed for itching.    Yes [provider]  magnesium oxide (MAG-OX) 400 MG tablet Take 800 mg by mouth 2 (two) times daily. Hold for diarrhea   Yes [provider]  Melatonin 3 MG TABS Take 3 mg by mouth at bedtime.   Yes [provider]  metoprolol succinate (TOPROL-XL) 25 MG 24 hr tablet Take 25 mg by mouth daily. 08/01/18  Yes [provider]  mirtazapine (REMERON) 15 MG tablet Take 15 mg by mouth at bedtime.   Yes [provider]  mometasone-formoterol (DULERA) 100-5 MCG/ACT AERO Inhale 2 puffs into the lungs 2 (two) times daily.   Yes [provider]  oxybutynin (DITROPAN) 5 MG tablet Take 5 mg by mouth 2 (two) times daily.   Yes [provider]  potassium chloride SA (K-DUR,KLOR-CON) 20 MEQ tablet Take 20 mEq by mouth daily.   Yes [provider]  pregabalin (LYRICA) 75 MG capsule Take 75-150 mg by mouth as directed. 75 mg in the morning and at noon. 150 mg at 9:30pm   Yes [provider]  senna (SENOKOT) 8.6 MG TABS tablet Take 2 tablets by mouth at bedtime as needed for mild constipation.   Yes [provider]  spironolactone (ALDACTONE) 25 MG tablet Take 0.5 tablets (12.5 mg total) by mouth daily. 11/14/17 11/14/18 Yes Wieting, Richard, MD  torsemide (DEMADEX) 20 MG tablet Take 1 tablet (20 mg total) by mouth daily. 11/14/17 11/14/18 Yes Wieting, Richard, MD  venlafaxine XR (EFFEXOR-XR) 75 MG 24 hr capsule Take 75 mg by mouth daily with breakfast.   Yes [provider]  cephALEXin (KEFLEX) 500 MG capsule Take 1 capsule (500 mg total) by mouth 3 (three) times daily. Patient not taking: Reported on  11/02/2018 08/17/18   Harvest Dark, MD  clonazePAM (KLONOPIN) 0.5 MG tablet Take 0.5 mg by mouth at bedtime as needed for anxiety.    [provider]  conjugated estrogens (PREMARIN) vaginal cream Place 1 Applicatorful vaginally daily. Mondays and Fridays    [provider]  diphenhydrAMINE (BENADRYL) 25 mg capsule Take 25 mg by mouth every 4 (four) hours as needed.    [provider]  Docusate Calcium (STOOL SOFTENER PO) Take 100 mg by mouth daily.     [provider]  esomeprazole (NEXIUM) 40 MG capsule Take 40 mg by mouth 2 (two) times daily.    [provider]  fluticasone (  FLONASE) 50 MCG/ACT nasal spray Place 2 sprays into both nostrils daily as needed for allergies or rhinitis.    [provider]  miconazole (MICOTIN) 2 % powder Apply 1 application topically as needed for itching.    [provider]  nitroGLYCERIN (NITROSTAT) 0.4 MG SL tablet Place 0.4 mg under the tongue every 5 (five) minutes as needed for chest pain.    [provider]  ondansetron (ZOFRAN ODT) 4 MG disintegrating tablet Take 1 tablet (4 mg total) by mouth every 8 (eight) hours as needed for nausea or vomiting. 08/17/18   Harvest Dark, MD  PARoxetine (PAXIL) 30 MG tablet Take 30 mg by mouth daily.    [provider]  rosuvastatin (CRESTOR) 20 MG tablet Take 20 mg by mouth at bedtime.     [provider]  traZODone (DESYREL) 50 MG tablet Take 50 mg by mouth at bedtime.    [provider]    REVIEW OF SYSTEMS:  Review of Systems  Unable to perform ROS: Acuity of condition     VITAL SIGNS:   Vitals:   11/01/18 2153 11/01/18 2200 11/01/18 2248 11/01/18 2300  BP:  (!) 87/67 93/60 (!) 88/67  Pulse:  83 79 78  Resp:  20 19 20   Temp:  97.8 F (36.6 C)    TempSrc:  Oral    SpO2:  97% 100% 100%  Weight: 90.9 kg      Wt Readings from Last 3 Encounters:  11/01/18 90.9 kg  08/17/18 103.9 kg  05/25/18 106.4 kg     PHYSICAL EXAMINATION:  Physical Exam  Vitals reviewed. Constitutional: She appears well-developed and well-nourished. No distress.  HENT:  Head: Normocephalic and atraumatic.  Mouth/Throat: Oropharynx is clear and moist.  Eyes: Pupils are equal, round, and reactive to light. Conjunctivae and EOM are normal. No scleral icterus.  Neck: Normal range of motion. Neck supple. No JVD present. No thyromegaly present.  Cardiovascular: Normal rate, regular rhythm and intact distal pulses. Exam reveals no gallop and no friction rub.  No murmur heard. Respiratory: Effort normal and breath sounds normal. No respiratory distress. She has no wheezes. She has no rales.  GI: Soft. Bowel sounds are normal. She exhibits no distension. There is no abdominal tenderness.  Musculoskeletal: Normal range of motion.        General: No edema.     Comments: No arthritis, no gout  Lymphadenopathy:    She has no cervical adenopathy.  Neurological: No cranial nerve deficit.  Unable to fully evaluate, patient is very somnolent.  She does arouse to noxious stimuli, but closes her eyes again right away  Skin: Skin is warm and dry. No rash noted. No erythema.  Psychiatric:  Unable to assess    LABORATORY PANEL:   CBC Recent Labs  Lab 11/01/18 2145  WBC 10.2  HGB 8.5*  HCT 24.9*  PLT 122*   ------------------------------------------------------------------------------------------------------------------  Chemistries  Recent Labs  Lab 11/01/18 2145  NA 131*  K 2.1*  CL 94*  CO2 23  GLUCOSE 123*  BUN 26*  CREATININE 1.06*  CALCIUM 8.1*  MG 1.6*  AST 24  ALT 27  ALKPHOS 75  BILITOT 1.2   ------------------------------------------------------------------------------------------------------------------  Cardiac Enzymes Recent Labs  Lab 11/01/18 2145  TROPONINI <0.03    ------------------------------------------------------------------------------------------------------------------  RADIOLOGY:  Ct Abdomen Pelvis Wo Contrast  Result Date: 11/01/2018 CLINICAL DATA:  Possible cardiac arrest/hypotension, history of ovarian carcinoma EXAM: CT ABDOMEN AND PELVIS WITHOUT CONTRAST TECHNIQUE: Multidetector CT imaging of  the abdomen and pelvis was performed following the standard protocol without IV contrast. COMPARISON:  10/16/2018 FINDINGS: Lower chest: Mild right basilar atelectasis is noted. Stable right-sided pleural effusion is seen. Hepatobiliary: No focal liver abnormality is seen. Status post cholecystectomy. No biliary dilatation. Pancreas: Unremarkable. No pancreatic ductal dilatation or surrounding inflammatory changes. Spleen: Normal in size without focal abnormality. Adrenals/Urinary Tract: Adrenal glands are within normal limits. Kidneys are well visualized without obstructive change. Scattered small left renal calculi are noted better appreciated on the noncontrast study. The bladder is decompressed by Foley catheter. Stomach/Bowel: Colon is predominately decompressed. No obstructive changes are seen. The appendix is not well visualized. No inflammatory changes to suggest appendicitis are seen. No small bowel obstructive changes are noted. Stomach is decompressed. Vascular/Lymphatic: Aortic atherosclerosis. No enlarged abdominal or pelvic lymph nodes. Reproductive: Uterus is stable in appearance. The ovaries are not visualized consistent with the recent surgical history. Other: Some mild peritoneal thickening is noted consistent with the patient's known history of omental disease. No free fluid is seen. Postsurgical changes are noted anteriorly. The degree of fluid along the surgical wound has decreased. Musculoskeletal: Degenerative changes of the lumbar spine are noted. No lytic or sclerotic lesions are seen. IMPRESSION: Stable right pleural effusion and right  basilar atelectasis. Postoperative changes. 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 No acute abnormality is noted. Electronically Signed   By: Inez Catalina M.D.   On: 11/01/2018 23:42   Ct Head Wo Contrast  Result Date: 11/02/2018 CLINICAL DATA:  Increased somnolence EXAM: CT HEAD WITHOUT CONTRAST TECHNIQUE: Contiguous axial images were obtained from the base of the skull through the vertex without intravenous contrast. COMPARISON:  11/13/2017 FINDINGS: Brain: Mild atrophic changes are noted. Stable chronic white matter ischemic changes are seen. Areas of prior frontal and parietal infarcts on the left are noted and stable. Dilated perivascular space in the left lentiform nucleus is again seen and stable. No findings to suggest acute hemorrhage, acute infarction or space-occupying mass lesion are noted. Cavum septum pellucidum is again seen. Vascular: No hyperdense vessel or unexpected calcification. Skull: Normal. Negative for fracture or focal lesion. Sinuses/Orbits: No acute finding. Other: None. IMPRESSION: Chronic changes without acute abnormality. Electronically Signed   By: Inez Catalina M.D.   On: 11/02/2018 00:25   Ct Chest Wo Contrast  Result Date: 11/02/2018 CLINICAL DATA:  History of ovarian carcinoma and hypotension EXAM: CT CHEST WITHOUT CONTRAST TECHNIQUE: Multidetector CT imaging of the chest was performed following the standard protocol without IV contrast. COMPARISON:  Chest x-ray from earlier in the same day, CT from 11/13/2017 FINDINGS: Cardiovascular: Thoracic aorta shows atherosclerotic calcifications without aneurysmal dilatation. No cardiac enlargement is noted. Coronary calcifications are seen. The pulmonary artery is within normal limits. A few small air bubbles are noted within the main pulmonary artery elated to the IV start. Mediastinum/Nodes: Nodular changes in the thyroid are again noted stable from the prior exam consistent with  underlying goiter. No significant hilar or mediastinal adenopathy is noted. The esophagus as visualized is within normal limits. Lungs/Pleura: Mild left basilar atelectatic changes are noted. Right-sided pleural effusion is noted with some patchy basilar infiltrate/atelectasis. No significant nodularity is noted. Upper Abdomen: Within normal limits as previously seen on CT of the abdomen. Musculoskeletal: Degenerative changes of the thoracic spine are noted. No acute bony abnormality is noted. IMPRESSION: Stable nodular changes within the thyroid consistent with goiter. Right-sided pleural effusion with underlying atelectasis/infiltrate. Mild left basilar atelectasis is noted as  well. Aortic Atherosclerosis (ICD10-I70.0). Electronically Signed   By: Inez Catalina M.D.   On: 11/02/2018 00:28   Dg Chest Port 1 View  Result Date: 11/01/2018 CLINICAL DATA:  63 y/o  F; loss of consciousness. EXAM: PORTABLE CHEST 1 VIEW COMPARISON:  08/17/2018 chest radiograph FINDINGS: Given positioning the lung apices are obscured by the patient's face and neck. Stable cardiac silhouette given projection and technique. Right port catheter tip projects over cavoatrial junction. Blunted costal diaphragmatic angles, likely small effusions. Pulmonary reticular opacities. No pneumothorax. No acute osseous abnormality is evident. IMPRESSION: Blunted costal diaphragmatic angles, likely small effusions. Pulmonary reticular opacities may represent interstitial edema. Electronically Signed   By: Kristine Garbe M.D.   On: 11/01/2018 22:47    EKG:   Orders placed or performed during the hospital encounter of 11/01/18  . ED EKG 12-Lead  . ED EKG 12-Lead    IMPRESSION AND PLAN:  Principal Problem:   Hypotension -unclear etiology.  Patient does not seem to be dehydrated.  She does not really meet sepsis criteria, though her procalcitonin was intermediate.  If she is infected it is unclear what her source is.  Based on imaging  perhaps pneumonia.  Her blood pressure has responded some to IV fluids, we will keep IV fluids in place for now.  We will continue antibiotics for now. Active Problems:   Hypokalemia -replace and monitor.  Magnesium was also low, will replace this as well   Somnolence -potentially due to her hypotension, related to whatever the same underlying causes.  She does have a history of sleep apnea and so we will check an ABG at this time given that she was laying around sleeping all day.  If cause is not elucidated and she has not improved by morning she will likely need a neurology consult, and also MRI imaging of her brain for the possibility of stroke.  She does have a history of stroke in the past   Chronic diastolic heart failure ([place]) -last echocardiogram showed normal EF and did not comment on diastolic dysfunction.  We will be cautious with her IV fluids, and continue home meds when she is awake enough to take p.o.   CAD (coronary artery disease) -continue home meds when she is able to take p.o.   HLD (hyperlipidemia) -home dose antilipid when she is alert enough to take p.o.   Sleep apnea -checking ABG as above, if her CO2 is elevated we will use BiPAP  Chart review performed and case discussed with ED provider. Labs, imaging and/or ECG reviewed by provider and discussed with patient/family. Management plans discussed with the patient and/or family.  DVT PROPHYLAXIS: SubQ lovenox   GI PROPHYLAXIS:  None  ADMISSION STATUS: Observation  CODE STATUS: Full Code Status History    Date Active Date Inactive Code Status Order ID Comments User Context   11/13/2017 0804 11/14/2017 1819 Full Code [id number]  Loletha Grayer, MD ED   11/05/2017 1638 11/06/2017 2025 Full Code [id number]  Salary, Avel Peace, MD Inpatient   02/05/2017 0035 02/05/2017 2254 Full Code [id number]  Valinda Party, DO Inpatient   02/15/2016 0806 02/15/2016 2136 Full Code [id number]  Hillary Bow, MD Inpatient   02/14/2016 1728  02/15/2016 0747 Full Code [id number]  Dustin Flock, MD ED   06/16/2015 1753 06/19/2015 1822 Full Code [id number]  Clayburn Pert, MD Inpatient      TOTAL TIME TAKING CARE OF THIS PATIENT: 40 minutes.   Ethlyn Daniels 11/02/2018, 1:00 AM  [facility]  Office  [phone]  CC: Primary care physician; [name], MD  Note:  This document was prepared using Dragon voice recognition software and may include unintentional dictation errors.

## 2018-11-02 NOTE — Progress Notes (Signed)
Dr salary notified of critical potassium level of 2.3. Pt is still receiving Potassium IV. Will continue to monitor.

## 2018-11-02 NOTE — Consult Note (Signed)
[place] Nurse wound consult note Reason for Consult:Midline abdominal wound.  Recent surgery for ovarian cancer and I & D after laparotomy.  Per [facility] notes, HH was applying and changing WOund VAC dressings M/W/F and transitioned to  NS moist gauze .  Will continue this during stay.  Wound type:surgical, infectious Pressure Injury POA: NA Measurement: 18 cm x 1 cm x 0.4 cm  Wound JFT:NBZX pink nongranulating.  Drainage (amount, consistency, odor) minimal serosanguinous no odor Periwound:intact Dressing procedure/placement/frequency:Cleanse abdominal wound with NS and pat dry.  Apply NS moist kerlix to wound bed.  Cover with ABD pads and tape daily.  Will not follow at this time.  Please re-consult if needed.  Domenic Moras MSN, RN, FNP-BC CWON Wound, Ostomy, Continence Nurse Pager [phone]

## 2018-11-02 NOTE — TOC Initial Note (Signed)
Transition of Care [facility]) - Initial/Assessment Note    Patient Details  Name: Destiny [name] MRN: [id number] Date of Birth: [birth year]  [facility] CM/SW Contact:    Su Hilt, RN Phone Number: 11/02/2018, 4:16 PM  Clinical Narrative:                 Spoke with Husband about DC plan and needs The patient lives at home with her husband She has Home health PT with Velta Addison with [facility], her number is [phone] The patient has a RW, BSC and shower chair at home They have Dr. Posey Pronto as PCP and uses [facility], Husband provides transportation Continue to [place] for needs  Expected Discharge Plan: [place] Barriers to Discharge: Continued Medical Work up   Patient Goals and CMS Choice Patient states their goals for this hospitalization and ongoing recovery are:: go home CMS Medicare.gov Compare Post Acute Care list provided to:: Patient Choice offered to / list presented to : Spouse  Expected Discharge Plan and Services Expected Discharge Plan: [place]   Discharge Planning Services: CM Consult Post Acute Care Choice: [place] arrangements for the past 2 months: Single Family Home                   DME Agency: (home health already in place)      Prior Living Arrangements/Services Living arrangements for the past 2 months: [place] with:: Spouse Patient language and need for interpreter reviewed:: No Do you feel safe going back to the place where you live?: No      Need for Family Participation in Patient Care: Yes (Comment) Care giver support system in place?: Yes (comment) Current home services: DME, Home PT, Home RN, Homehealth aide(walker BSC, shower chair, home health agency ) Criminal Activity/Legal Involvement Pertinent to Current Situation/Hospitalization: No - Comment as needed  Activities of Daily Living      Permission Sought/Granted    Permission granted to share information with : Yes, Verbal Permission Granted     Permission granted to share info w AGENCY: HH agency        Emotional Assessment Appearance:: Appears stated age Attitude/Demeanor/Rapport: Engaged Affect (typically observed): Accepting Orientation: : Oriented to Self Alcohol / Substance Use: Never Used Psych Involvement: No (comment)  Admission diagnosis:  Hypotension, unspecified hypotension type [I95.9] Patient Active Problem List   Diagnosis Date Noted  . HLD (hyperlipidemia) 11/02/2018  . GERD (gastroesophageal reflux disease) 11/02/2018  . CAD (coronary artery disease) 11/02/2018  . Sleep apnea 11/02/2018  . Hypotension 11/02/2018  . Somnolence 11/02/2018  . Chronic diastolic heart failure ([place]) 11/15/2017  . HTN (hypertension) 11/15/2017  . Hypokalemia 11/05/2017  . Chest pain 02/14/2016  . Small bowel obstruction ([place]) 06/16/2015   PCP:  [name], MD Pharmacy:   [place], [place] - [address] [place] [address] Phone: [phone] Fax: [place] [place] [address] Phone: [phone] Fax: [phone]     Social Determinants of Health (SDOH) Interventions    Readmission Risk Interventions No flowsheet data found.

## 2018-11-03 ENCOUNTER — Other Ambulatory Visit: Payer: Self-pay

## 2018-11-03 LAB — MAGNESIUM: Magnesium: 2 mg/dL (ref 1.7–2.4)

## 2018-11-03 LAB — BASIC METABOLIC PANEL
Anion gap: 9 (ref 5–15)
BUN: 13 mg/dL (ref 8–23)
CO2: 23 mmol/L (ref 22–32)
Calcium: 8 mg/dL — ABNORMAL LOW (ref 8.9–10.3)
Chloride: 103 mmol/L (ref 98–111)
Creatinine, Ser: 0.69 mg/dL (ref 0.44–1.00)
GFR calc Af Amer: >60 mL/min (ref >60)
GFR calc non Af Amer: >60 mL/min (ref >60)
Glucose, Bld: 114 mg/dL — ABNORMAL HIGH (ref 70–99)
Potassium: 2.9 mmol/L — ABNORMAL LOW (ref 3.5–5.1)
Sodium: 135 mmol/L (ref 135–145)

## 2018-11-03 LAB — CBC
HCT: 24.6 % — ABNORMAL LOW (ref 36.0–46.0)
Hemoglobin: 8.1 g/dL — ABNORMAL LOW (ref 12.0–15.0)
MCH: 29.1 pg (ref 26.0–34.0)
MCHC: 32.9 g/dL (ref 30.0–36.0)
MCV: 88.5 fL (ref 80.0–100.0)
Platelets: 63 10*3/uL — ABNORMAL LOW (ref 150–400)
RBC: 2.78 MIL/uL — ABNORMAL LOW (ref 3.87–5.11)
RDW: 15.1 % (ref 11.5–15.5)
WBC: 5.5 10*3/uL (ref 4.0–10.5)
nRBC: 0 % (ref 0.0–0.2)

## 2018-11-03 LAB — BPAM RBC
Blood Product Expiration Date: 202004272359
ISSUE DATE / TIME: 202004171338
Unit Type and Rh: 600

## 2018-11-03 LAB — TYPE AND SCREEN
ABO/RH(D): A POS
Antibody Screen: NEGATIVE
Unit division: 0

## 2018-11-03 LAB — RPR: RPR Ser Ql: NONREACTIVE

## 2018-11-03 LAB — URINE CULTURE: Culture: NO GROWTH

## 2018-11-03 LAB — HIV ANTIBODY (ROUTINE TESTING W REFLEX): HIV Screen 4th Generation wRfx: NONREACTIVE

## 2018-11-03 MED ORDER — SODIUM CHLORIDE 0.9% FLUSH
10.0000 mL | INTRAVENOUS | Status: AC | PRN
  Administered 2018-11-04: 14:00:00 10 mL

## 2018-11-03 MED ORDER — SODIUM CHLORIDE 0.9% FLUSH
3.0000 mL | Freq: Two times a day (BID) | INTRAVENOUS | Status: DC
  Administered 2018-11-03 – 2018-11-09 (×13): 3 mL via INTRAVENOUS

## 2018-11-03 MED ORDER — FAMOTIDINE 20 MG PO TABS
20.0000 mg | ORAL_TABLET | Freq: Two times a day (BID) | ORAL | Status: DC
  Administered 2018-11-03 – 2018-11-09 (×13): 20 mg via ORAL
  Filled 2018-11-03 (×13): qty 1

## 2018-11-03 MED ORDER — HEPARIN SOD (PORK) LOCK FLUSH 100 UNIT/ML IV SOLN: 250.0000 [IU] | INTRAVENOUS | Status: DC | PRN

## 2018-11-03 MED ORDER — ANTICOAGULANT SODIUM CITRATE 4% (200MG/5ML) IV SOLN
5.0000 mL | Status: DC | PRN
  Filled 2018-11-03: qty 5

## 2018-11-03 MED ORDER — VENLAFAXINE HCL ER 75 MG PO CP24
75.0000 mg | ORAL_CAPSULE | Freq: Every day | ORAL | Status: DC
  Administered 2018-11-04 – 2018-11-09 (×6): 75 mg via ORAL
  Filled 2018-11-03 (×6): qty 1

## 2018-11-03 MED ORDER — SPIRONOLACTONE 25 MG PO TABS
12.5000 mg | ORAL_TABLET | Freq: Every day | ORAL | Status: DC
  Administered 2018-11-03 – 2018-11-09 (×5): 12.5 mg via ORAL
  Filled 2018-11-03 (×5): qty 0.5
  Filled 2018-11-03 (×4): qty 1
  Filled 2018-11-03: qty 0.5
  Filled 2018-11-03: qty 1
  Filled 2018-11-03: qty 0.5
  Filled 2018-11-03: qty 1

## 2018-11-03 MED ORDER — PREGABALIN 75 MG PO CAPS
150.0000 mg | ORAL_CAPSULE | Freq: Every day | ORAL | Status: DC
  Administered 2018-11-03 – 2018-11-08 (×6): 150 mg via ORAL
  Filled 2018-11-03 (×6): qty 2

## 2018-11-03 MED ORDER — POTASSIUM CHLORIDE CRYS ER 20 MEQ PO TBCR
40.0000 meq | EXTENDED_RELEASE_TABLET | Freq: Once | ORAL | Status: AC
  Administered 2018-11-03: 10:00:00 40 meq via ORAL
  Filled 2018-11-03: qty 2

## 2018-11-03 MED ORDER — SODIUM CHLORIDE 0.9% FLUSH
10.0000 mL | INTRAVENOUS | Status: AC | PRN
  Administered 2018-11-04: 18:00:00 10 mL

## 2018-11-03 MED ORDER — OXYBUTYNIN CHLORIDE 5 MG PO TABS
5.0000 mg | ORAL_TABLET | Freq: Two times a day (BID) | ORAL | Status: DC
  Administered 2018-11-03 – 2018-11-09 (×13): 5 mg via ORAL
  Filled 2018-11-03 (×13): qty 1

## 2018-11-03 MED ORDER — PAROXETINE HCL 30 MG PO TABS
30.0000 mg | ORAL_TABLET | Freq: Every day | ORAL | Status: DC
  Administered 2018-11-03 – 2018-11-09 (×7): 30 mg via ORAL
  Filled 2018-11-03 (×9): qty 1

## 2018-11-03 MED ORDER — ALBUTEROL SULFATE (2.5 MG/3ML) 0.083% IN NEBU: 2.5000 mg | INHALATION_SOLUTION | Freq: Four times a day (QID) | RESPIRATORY_TRACT | Status: DC | PRN

## 2018-11-03 MED ORDER — PREGABALIN 75 MG PO CAPS: 75.0000 mg | ORAL_CAPSULE | ORAL | Status: DC

## 2018-11-03 MED ORDER — SODIUM CHLORIDE 0.9% FLUSH
10.0000 mL | INTRAVENOUS | Status: AC | PRN
  Administered 2018-11-05: 11:00:00 10 mL

## 2018-11-03 MED ORDER — ALTEPLASE 2 MG IJ SOLR
2.0000 mg | Freq: Once | INTRAMUSCULAR | Status: DC | PRN
  Filled 2018-11-03: qty 2

## 2018-11-03 MED ORDER — CLONAZEPAM 0.5 MG PO TABS
0.5000 mg | ORAL_TABLET | Freq: Every evening | ORAL | Status: DC | PRN
  Administered 2018-11-06: 21:00:00 0.5 mg via ORAL
  Filled 2018-11-03: qty 1

## 2018-11-03 MED ORDER — PREGABALIN 75 MG PO CAPS
75.0000 mg | ORAL_CAPSULE | Freq: Two times a day (BID) | ORAL | Status: DC
  Administered 2018-11-03 – 2018-11-09 (×12): 75 mg via ORAL
  Filled 2018-11-03 (×12): qty 1

## 2018-11-03 MED ORDER — ROSUVASTATIN CALCIUM 20 MG PO TABS
20.0000 mg | ORAL_TABLET | Freq: Every day | ORAL | Status: DC
  Administered 2018-11-03 – 2018-11-08 (×6): 20 mg via ORAL
  Filled 2018-11-03 (×6): qty 1

## 2018-11-03 MED ORDER — SODIUM CHLORIDE 0.9% FLUSH
10.0000 mL | INTRAVENOUS | Status: AC | PRN
  Administered 2018-11-04: 10 mL

## 2018-11-03 NOTE — Plan of Care (Signed)
  Problem: Respiratory: Goal: Ability to maintain adequate ventilation will improve Outcome: Completed/Met

## 2018-11-03 NOTE — Progress Notes (Signed)
[facility] NAME: Destiny [name]    MR#:  [id number]  DATE OF BIRTH:  1955-11-12  SUBJECTIVE:   Patient presented to the hospital due to altered mental status/somnolence and noted to be hypokalemic with suspected sepsis.  Fever has resolved overnight.  No other acute events overnight.  Patient's mental status improved and she is more awake today.  Having some intermittent nausea this morning but improved.   REVIEW OF SYSTEMS:    Review of Systems  Constitutional: Negative for chills and fever.  HENT: Negative for congestion and tinnitus.   Eyes: Negative for blurred vision and double vision.  Respiratory: Negative for cough, shortness of breath and wheezing.   Cardiovascular: Negative for chest pain, orthopnea and PND.  Gastrointestinal: Positive for nausea. Negative for abdominal pain, diarrhea and vomiting.  Genitourinary: Negative for dysuria and hematuria.  Neurological: Positive for weakness (generalized). Negative for dizziness, sensory change and focal weakness.  All other systems reviewed and are negative.   Nutrition: Heart Healthy Tolerating Diet: Yes Tolerating PT: Await Eval.   DRUG ALLERGIES:   Allergies  Allergen Reactions   Atorvastatin Hives    No s/sx of anaphylaxis, just intermittent hives.  Does not seem to be a class effect as she has tolerated lovastatin previously No s/sx of anaphylaxis, just intermittent hives.  Does not seem to be a class effect as she has tolerated lovastatin previously   Fluconazole Rash    Reaction to generic but not name brand Reaction to generic but not name brand CAN USE DIFLUCAN.DOES NOT BREAK OUT WITH DIFLUCAN.(allergic only to generic brand)   Sertraline Itching   Sulfa Antibiotics Rash and Other (See Comments)    "hot " sensation   Sulfamethoxazole-Trimethoprim Rash and Other (See Comments)    "felt hot" Other reaction(s): Other (See Comments) "felt hot"    Sulfasalazine Other (See Comments) and Rash    "hot " sensation    VITALS:  Blood pressure 107/67, pulse 100, temperature 98.7 F (37.1 C), temperature source Oral, resp. rate 20, height 5' 2" (1.575 m), weight 90.9 kg, SpO2 98 %.  PHYSICAL EXAMINATION:   Physical Exam  GENERAL:  63 y.o.-year-old patient lying in bed in no acute distress.  EYES: Pupils equal, round, reactive to light and accommodation. No scleral icterus. Extraocular muscles intact.  HEENT: Head atraumatic, normocephalic. Oropharynx and nasopharynx clear.  NECK:  Supple, no jugular venous distention. No thyroid enlargement, no tenderness.  LUNGS: Normal breath sounds bilaterally, no wheezing, rales, rhonchi. No use of accessory muscles of respiration.  CARDIOVASCULAR: S1, S2 normal. No murmurs, rubs, or gallops.  ABDOMEN: Soft, nontender, nondistended. Bowel sounds present. No organomegaly or mass.  EXTREMITIES: No cyanosis, clubbing or edema b/l.    NEUROLOGIC: Cranial nerves II through XII are intact. No focal Motor or sensory deficits b/l. Globally weak.   PSYCHIATRIC: The patient is alert and oriented x 3.  SKIN: No obvious rash, lesion, or ulcer.    LABORATORY PANEL:   CBC Recent Labs  Lab 11/03/18 0432  WBC 5.5  HGB 8.1*  HCT 24.6*  PLT 63*   ------------------------------------------------------------------------------------------------------------------  Chemistries  Recent Labs  Lab 11/01/18 2145  11/03/18 0432  NA 131*   < > 135  K 2.1*   < > 2.9*  CL 94*   < > 103  CO2 23   < > 23  GLUCOSE 123*   < > 114*  BUN 26*   < >  13  CREATININE 1.06*   < > 0.69  CALCIUM 8.1*   < > 8.0*  MG 1.6*  --  2.0  AST 24  --   --   ALT 27  --   --   ALKPHOS 75  --   --   BILITOT 1.2  --   --    < > = values in this interval not displayed.   ------------------------------------------------------------------------------------------------------------------  Cardiac Enzymes Recent Labs  Lab  11/01/18 2145  TROPONINI <0.03   ------------------------------------------------------------------------------------------------------------------  RADIOLOGY:  Ct Abdomen Pelvis Wo Contrast  Result Date: 11/01/2018 CLINICAL DATA:  Possible cardiac arrest/hypotension, history of ovarian carcinoma EXAM: CT ABDOMEN AND PELVIS WITHOUT CONTRAST TECHNIQUE: Multidetector CT imaging of the abdomen and pelvis was performed following the standard protocol without IV contrast. COMPARISON:  10/16/2018 FINDINGS: Lower chest: Mild right basilar atelectasis is noted. Stable right-sided pleural effusion is seen. Hepatobiliary: No focal liver abnormality is seen. Status post cholecystectomy. No biliary dilatation. Pancreas: Unremarkable. No pancreatic ductal dilatation or surrounding inflammatory changes. Spleen: Normal in size without focal abnormality. Adrenals/Urinary Tract: Adrenal glands are within normal limits. Kidneys are well visualized without obstructive change. Scattered small left renal calculi are noted better appreciated on the noncontrast study. The bladder is decompressed by Foley catheter. Stomach/Bowel: Colon is predominately decompressed. No obstructive changes are seen. The appendix is not well visualized. No inflammatory changes to suggest appendicitis are seen. No small bowel obstructive changes are noted. Stomach is decompressed. Vascular/Lymphatic: Aortic atherosclerosis. No enlarged abdominal or pelvic lymph nodes. Reproductive: Uterus is stable in appearance. The ovaries are not visualized consistent with the recent surgical history. Other: Some mild peritoneal thickening is noted consistent with the patient's known history of omental disease. No free fluid is seen. Postsurgical changes are noted anteriorly. The degree of fluid along the surgical wound has decreased. Musculoskeletal: Degenerative changes of the lumbar spine are noted. No lytic or sclerotic lesions are seen. IMPRESSION: Stable  right pleural effusion and right basilar atelectasis. Postoperative changes. 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 No acute abnormality is noted. Electronically Signed   By: Inez Catalina M.D.   On: 11/01/2018 23:42   Ct Head Wo Contrast  Result Date: 11/02/2018 CLINICAL DATA:  Increased somnolence EXAM: CT HEAD WITHOUT CONTRAST TECHNIQUE: Contiguous axial images were obtained from the base of the skull through the vertex without intravenous contrast. COMPARISON:  11/13/2017 FINDINGS: Brain: Mild atrophic changes are noted. Stable chronic white matter ischemic changes are seen. Areas of prior frontal and parietal infarcts on the left are noted and stable. Dilated perivascular space in the left lentiform nucleus is again seen and stable. No findings to suggest acute hemorrhage, acute infarction or space-occupying mass lesion are noted. Cavum septum pellucidum is again seen. Vascular: No hyperdense vessel or unexpected calcification. Skull: Normal. Negative for fracture or focal lesion. Sinuses/Orbits: No acute finding. Other: None. IMPRESSION: Chronic changes without acute abnormality. Electronically Signed   By: Inez Catalina M.D.   On: 11/02/2018 00:25   Ct Chest Wo Contrast  Result Date: 11/02/2018 CLINICAL DATA:  History of ovarian carcinoma and hypotension EXAM: CT CHEST WITHOUT CONTRAST TECHNIQUE: Multidetector CT imaging of the chest was performed following the standard protocol without IV contrast. COMPARISON:  Chest x-ray from earlier in the same day, CT from 11/13/2017 FINDINGS: Cardiovascular: Thoracic aorta shows atherosclerotic calcifications without aneurysmal dilatation. No cardiac enlargement is noted. Coronary calcifications are seen. The pulmonary artery is within normal limits. A few  small air bubbles are noted within the main pulmonary artery elated to the IV start. Mediastinum/Nodes: Nodular changes in the thyroid are again noted stable from  the prior exam consistent with underlying goiter. No significant hilar or mediastinal adenopathy is noted. The esophagus as visualized is within normal limits. Lungs/Pleura: Mild left basilar atelectatic changes are noted. Right-sided pleural effusion is noted with some patchy basilar infiltrate/atelectasis. No significant nodularity is noted. Upper Abdomen: Within normal limits as previously seen on CT of the abdomen. Musculoskeletal: Degenerative changes of the thoracic spine are noted. No acute bony abnormality is noted. IMPRESSION: Stable nodular changes within the thyroid consistent with goiter. Right-sided pleural effusion with underlying atelectasis/infiltrate. Mild left basilar atelectasis is noted as well. Aortic Atherosclerosis (ICD10-I70.0). Electronically Signed   By: Inez Catalina M.D.   On: 11/02/2018 00:28   Dg Chest Port 1 View  Result Date: 11/01/2018 CLINICAL DATA:  63 y/o  F; loss of consciousness. EXAM: PORTABLE CHEST 1 VIEW COMPARISON:  08/17/2018 chest radiograph FINDINGS: Given positioning the lung apices are obscured by the patient's face and neck. Stable cardiac silhouette given projection and technique. Right port catheter tip projects over cavoatrial junction. Blunted costal diaphragmatic angles, likely small effusions. Pulmonary reticular opacities. No pneumothorax. No acute osseous abnormality is evident. IMPRESSION: Blunted costal diaphragmatic angles, likely small effusions. Pulmonary reticular opacities may represent interstitial edema. Electronically Signed   By: Kristine Garbe M.D.   On: 11/01/2018 22:47     ASSESSMENT AND PLAN:   63 year old female with past medical history of ovarian cancer, hypertension, hyperlipidemia, GERD, history of previous CVA, CHF, coronary disease, presented to the hospital due to weakness/altered mental status and also noted to be hypokalemic with suspected sepsis.  1.  Sepsis-patient met criteria admission given her fever,  hypotension and suspected pneumonia on CT chest. - Clinically much improved since yesterday, hemodynamics improved, afebrile now.  Empirically was on vancomycin, cefepime.  Will DC vancomycin as MRSA PCR is negative. - Clinically improving  2.  Hypokalemia-improving with supplementation will continue to monitor.  3.  Altered mental status/weakness-secondary to hypotension and sepsis.  Much improved and is at baseline now. - We will get physical therapy consult to assess mobility.  Continues treatment of sepsis as mentioned above.  4.  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 - Follow counts.  No acute need for transfusion.  5.  GERD-continue Pepcid.  6.  History of urinary incontinence-continue oxybutynin.  7.  Anxiety-continue Paxil, Klonopin.  8. Hyperlipidemia - cont. Crestor.    9. Depression - cont. Effexor.   All the records are reviewed and case discussed with Care Management/Social Worker. Management plans discussed with the patient, family and they are in agreement.  CODE STATUS: Full code  DVT Prophylaxis: Lovenox  TOTAL TIME TAKING CARE OF THIS PATIENT: 30 minutes.   POSSIBLE D/C IN 2-3 DAYS, DEPENDING ON CLINICAL CONDITION.   Henreitta Leber M.D on 11/03/2018 at 12:35 PM  Between 7am to 6pm - Pager - [phone]  After 6pm go to [email or web address] - Proofreader  [facility]  Office  [phone]  CC: Primary care physician; [name], MD

## 2018-11-03 NOTE — Progress Notes (Addendum)
Pt ate some mashed potatoes; poor appetite. Foley removed with external catheter effective with clear, yellow urine output. Slept at intervals. Generalized weakness; reports left hand feels weak. TC from husband reports pt has had some left hand weakness since Wednesday like holding a cup.  Pt has left hand grip with slightly less than right hand; slight drift left arm, reports it feels numb, but sensation test pt can feel equally. No other neuro changes noted.  Will page doctor on call. Had chemo approximately 1 1/2 weeks ago.   Reviewed head CT report with MD on phone. Shared findings and pt/husband concern. Will do neurochecks.

## 2018-11-03 NOTE — Progress Notes (Signed)
Pt has very poor appetite- refused breakfast; did eat some yogurt/pudding/1 juice and tolerates. Zofran po given with relief for nausea. Generalized weakness. Up to [facility] with 1+ with soft brown formed stools. Up in chair x 2. 02 discontinued. K+ po given for K+ 2.9. IVF's continued. Home med list updated with meds updated. Foley in place with pt reporting history of incontinence-wears depends at home.

## 2018-11-04 ENCOUNTER — Encounter: Payer: Self-pay | Admitting: Radiology

## 2018-11-04 ENCOUNTER — Inpatient Hospital Stay: Payer: Medicaid Other

## 2018-11-04 ENCOUNTER — Inpatient Hospital Stay (HOSPITAL_COMMUNITY)
Admit: 2018-11-04 | Discharge: 2018-11-04 | Disposition: A | Discharge status: Home / Self Care | Payer: Medicaid Other | Attending: Specialist | Admitting: Specialist

## 2018-11-04 DIAGNOSIS — I340 Nonrheumatic mitral (valve) insufficiency: Secondary | ICD-10-CM

## 2018-11-04 DIAGNOSIS — R079 Chest pain, unspecified: Secondary | ICD-10-CM

## 2018-11-04 LAB — CBC
HCT: 23.3 % — ABNORMAL LOW (ref 36.0–46.0)
Hemoglobin: 7.9 g/dL — ABNORMAL LOW (ref 12.0–15.0)
MCH: 29.9 pg (ref 26.0–34.0)
MCHC: 33.9 g/dL (ref 30.0–36.0)
MCV: 88.3 fL (ref 80.0–100.0)
Platelets: 39 10*3/uL — ABNORMAL LOW (ref 150–400)
RBC: 2.64 MIL/uL — ABNORMAL LOW (ref 3.87–5.11)
RDW: 15 % (ref 11.5–15.5)
WBC: 3.5 10*3/uL — ABNORMAL LOW (ref 4.0–10.5)
nRBC: 0 % (ref 0.0–0.2)

## 2018-11-04 LAB — BASIC METABOLIC PANEL
Anion gap: 8 (ref 5–15)
BUN: 8 mg/dL (ref 8–23)
CO2: 22 mmol/L (ref 22–32)
Calcium: 8.4 mg/dL — ABNORMAL LOW (ref 8.9–10.3)
Chloride: 105 mmol/L (ref 98–111)
Creatinine, Ser: 0.64 mg/dL (ref 0.44–1.00)
GFR calc Af Amer: >60 mL/min (ref >60)
GFR calc non Af Amer: >60 mL/min (ref >60)
Glucose, Bld: 103 mg/dL — ABNORMAL HIGH (ref 70–99)
Potassium: 3.3 mmol/L — ABNORMAL LOW (ref 3.5–5.1)
Sodium: 135 mmol/L (ref 135–145)

## 2018-11-04 LAB — ECHOCARDIOGRAM COMPLETE
Height: 62 in
Weight: 3208 oz

## 2018-11-04 MED ORDER — CLOPIDOGREL BISULFATE 75 MG PO TABS: 75.0000 mg | ORAL_TABLET | Freq: Every day | ORAL | Status: DC

## 2018-11-04 MED ORDER — PERFLUTREN LIPID MICROSPHERE
1.0000 mL | INTRAVENOUS | Status: AC | PRN
  Administered 2018-11-04: 15:00:00 4 mL via INTRAVENOUS
  Filled 2018-11-04: qty 10

## 2018-11-04 MED ORDER — IOHEXOL 350 MG/ML SOLN
75.0000 mL | Freq: Once | INTRAVENOUS | Status: AC | PRN
  Administered 2018-11-04: 09:00:00 75 mL via INTRAVENOUS

## 2018-11-04 MED ORDER — ASPIRIN EC 325 MG PO TBEC
325.0000 mg | DELAYED_RELEASE_TABLET | Freq: Once | ORAL | Status: AC
  Administered 2018-11-04: 325 mg via ORAL
  Filled 2018-11-04: qty 1

## 2018-11-04 MED ORDER — STROKE: EARLY STAGES OF RECOVERY BOOK
Freq: Once | Status: AC
  Administered 2018-11-04

## 2018-11-04 MED ORDER — OCUVITE-LUTEIN PO CAPS
1.0000 | ORAL_CAPSULE | Freq: Every day | ORAL | Status: DC
  Administered 2018-11-05 – 2018-11-09 (×5): 1 via ORAL
  Filled 2018-11-04 (×5): qty 1

## 2018-11-04 MED ORDER — ENSURE ENLIVE PO LIQD
237.0000 mL | Freq: Two times a day (BID) | ORAL | Status: DC
  Administered 2018-11-04 (×2): 237 mL via ORAL

## 2018-11-04 MED ORDER — CLOPIDOGREL BISULFATE 75 MG PO TABS
300.0000 mg | ORAL_TABLET | Freq: Once | ORAL | Status: DC
  Filled 2018-11-04: qty 4

## 2018-11-04 MED ORDER — SODIUM CHLORIDE 0.9 % IV BOLUS
500.0000 mL | Freq: Once | INTRAVENOUS | Status: AC
  Administered 2018-11-04: 13:00:00 500 mL via INTRAVENOUS

## 2018-11-04 NOTE — Progress Notes (Signed)
Noted on 1st round that pt could not move left arm; would shrug shoulder; unable to lift arm; weak hand grip. Full neuro exam done with [facility] with left facial weakness, pt unable to lift left arm, weak left hand drip, drift in left leg and some ataxia. Dr. Verdell Carmine notified with stat aspirin given and on the way, Stat CT head and MRI done, neuro consult done with Dr. Lorraine Lax in immediately. VSS with low grade fever; diminished breath sounds.  Pt swallows pills/water well. Pt transported to CT scan for stat scan. Dr. Verdell Carmine going to notify husband when scans are done.

## 2018-11-04 NOTE — Consult Note (Addendum)
Requesting Physician: Dr.     Laurel Dimmer Complaint:   History obtained from: Patient and Chart     HPI:                                                                                                                                       Destiny [name] is a 63 y.o. female with PMH of Ovarian cancer,CAD, CVA, HLD, HTN admitted for sepsis.  Patient has been apparently dropping things in her left arm prior to admission and yesterday around 6.30 pm started complaining of numbness in her left arm. Today around 8 am nurse noted that she was significantly weaker in her left arm and left leg, mild dysarthria and facial droop. Neurology was consulted and [facility] Stat CT head was performed which showed no hemorrhage and CTA shows no LVO. Not a candidate for tPA as outside window.   Date last known well: 4/16 tPA Given: no, outside window NIHSS: 7 Baseline MRS 0    Past Medical History:  Diagnosis Date  . CAD (coronary artery disease)   . CHF (congestive heart failure) ([place])   . CVA (cerebral infarction)   . Female bladder prolapse   . GERD (gastroesophageal reflux disease)   . Hyperlipidemia   . Hypertension   . Hypokalemia   . Mitral valve disease   . Ovarian ca ([place])   . Sleep apnea   . Stroke [facility])    x3    Past Surgical History:  Procedure Laterality Date  . CARPAL TUNNEL RELEASE Bilateral   . CATARACT EXTRACTION Right 11/22/2017  . CHOLECYSTECTOMY    . COLON SURGERY    . TENNIS ELBOW RELEASE/NIRSCHEL PROCEDURE Left   . TONSILLECTOMY      Family History  Problem Relation Age of Onset  . Heart disease Other   . Hypertension Other   . Rheum arthritis Mother   . CAD Father    Social History:  reports that she has never smoked. She has never used smokeless tobacco. She reports that she does not drink alcohol or use drugs.  Allergies:  Allergies  Allergen Reactions  . Atorvastatin Hives    No s/sx of anaphylaxis, just intermittent hives.  Does not seem to  be a class effect as she has tolerated lovastatin previously No s/sx of anaphylaxis, just intermittent hives.  Does not seem to be a class effect as she has tolerated lovastatin previously  . Fluconazole Rash    Reaction to generic but not name brand Reaction to generic but not name brand CAN USE DIFLUCAN.DOES NOT BREAK OUT WITH DIFLUCAN.(allergic only to generic brand)  . Sertraline Itching  . Sulfa Antibiotics Rash and Other (See Comments)    "hot " sensation  . Sulfamethoxazole-Trimethoprim Rash and Other (See Comments)    "felt hot" Other reaction(s): Other (See Comments) "felt hot"  . Sulfasalazine Other (See Comments) and Rash    "  hot " sensation    Medications:                                                                                                                        I reviewed home medications   ROS:                                                                                                                                     14 systems reviewed and negative except above   Examination:                                                                                                      General: Appears well-developed  Psych: Affect appropriate to situation Eyes: No scleral injection HENT: No OP obstrucion Head: Normocephalic.  Cardiovascular: Normal rate and regular rhythm.  Respiratory: Effort normal and breath sounds normal to anterior ascultation GI: Soft.  No distension. There is no tenderness.  Skin: WDI    Neurological Examination Mental Status: Alert, oriented, thought content appropriate.  Speech fluent without evidence of aphasia. Mild dysarthria.  Able to follow 3 step commands without difficulty. Cranial Nerves: II: Visual fields grossly normal,  III,IV, VI: ptosis not present, extra-ocular motions intact bilaterally, pupils equal, round, reactive to light and accommodation V,VII: mild facial droop, facial light touch sensation normal  bilaterally VIII: hearing normal bilaterally IX,X: uvula rises symmetrically XI: bilateral shoulder shrug XII: midline tongue extension Motor: Right : Upper extremity   5/5    Left:     Upper extremity   2/5  Lower extremity   5/5     Lower extremity   3/5 Tone and bulk:normal tone throughout; no atrophy noted Sensory: reduced sensation to light touch on the left arm and leg Deep Tendon Reflexes: 2+ and symmetric throughout Plantars: Right: downgoing   Left: downgoing Cerebellar: normal finger-to-nose on right arm  Gait: not able to walk     Lab Results: Basic Metabolic Panel: Recent Labs  Lab 11/01/18 2145 11/02/18 0641 11/02/18 1151 11/03/18 0432 11/04/18 0635  NA 131* 136  --  135 135  K 2.1* 2.3* 2.8* 2.9* 3.3*  CL 94* 103  --  103 105  CO2 23 24  --  23 22  GLUCOSE 123* 127*  --  114* 103*  BUN 26* 21  --  13 8  CREATININE 1.06* 0.96  --  0.69 0.64  CALCIUM 8.1* 7.8*  --  8.0* 8.4*  MG 1.6*  --   --  2.0  --     CBC: Recent Labs  Lab 11/01/18 2145 11/02/18 0641 11/03/18 0432 11/04/18 0635  WBC 10.2 7.5 5.5 3.5*  NEUTROABS 7.6  --   --   --   HGB 8.5* 7.5* 8.1* 7.9*  HCT 24.9* 22.7* 24.6* 23.3*  MCV 87.1 88.0 88.5 88.3  PLT 122* 96* 63* 39*    Coagulation Studies: No results for input(s): LABPROT, INR in the last 72 hours.  Imaging: Ct Angio Head W Or Wo Contrast  Result Date: 11/04/2018 CLINICAL DATA:  Left-sided weakness. EXAM: CT ANGIOGRAPHY HEAD AND NECK TECHNIQUE: Multidetector CT imaging of the head and neck was performed using the standard protocol during bolus administration of intravenous contrast. Multiplanar CT image reconstructions and MIPs were obtained to evaluate the vascular anatomy. Carotid stenosis measurements (when applicable) are obtained utilizing NASCET criteria, using the distal internal carotid diameter as the denominator. CONTRAST:  31mL OMNIPAQUE IOHEXOL 350 MG/ML SOLN COMPARISON:  Carotid Doppler ultrasound 02/15/2016. Chest  CT 11/01/2018. FINDINGS: CTA NECK FINDINGS Aortic arch: Standard 3 vessel aortic arch with widely patent arch vessel origins. Right carotid system: Patent with mild calcified plaque at the carotid bifurcation. No evidence of significant stenosis or dissection. Left carotid system: Patent with minimal calcified plaque at the carotid bifurcation. No evidence of significant stenosis or dissection. Vertebral arteries: Patent without evidence of stenosis or dissection. Mildly dominant left vertebral artery. Skeleton: Mild cervical spondylosis.  No suspicious osseous lesion. Other neck: No mass or enlarged lymph nodes. Upper chest: Partially visualized moderate-sized right pleural effusion, larger than on the recent chest CT. Associated compressive atelectasis in the right lung. Review of the MIP images confirms the above findings CTA HEAD FINDINGS Anterior circulation: The internal carotid arteries are patent from skull base to carotid termini with mild nonstenotic plaque bilaterally. ACAs and MCAs are patent with mild branch vessel irregularity but no evidence of proximal branch occlusion or significant proximal stenosis. The right ACA is dominant. No aneurysm is identified. Posterior circulation: The intracranial vertebral arteries are widely patent to the basilar. Patent left PICA, bilateral AICAs, and bilateral SCAs are identified. The basilar artery is widely patent. There is a fetal origin of the left PCA. A small right posterior communicating artery is present. Both PCAs are patent with branch vessel irregularity but no significant proximal stenosis. No aneurysm is identified. Venous sinuses: Patent. Anatomic variants: Fetal left PCA.  Dominant right ACA. Review of the MIP images confirms the above findings IMPRESSION: 1. Mild atherosclerosis in the head and neck without large vessel occlusion, significant stenosis, or aneurysm. 2. Moderate right pleural effusion, enlarged from 11/01/2018. These results were  communicated to Dr. Lorraine Lax at 9:28 am on 11/04/2018 by text page via the [facility] messaging system. Electronically Signed   By: Logan Bores M.D.   On: 11/04/2018 09:38   Ct Angio Neck W Or Wo Contrast  Result Date: 11/04/2018 CLINICAL DATA:  Left-sided weakness. EXAM: CT ANGIOGRAPHY HEAD AND NECK TECHNIQUE:  Multidetector CT imaging of the head and neck was performed using the standard protocol during bolus administration of intravenous contrast. Multiplanar CT image reconstructions and MIPs were obtained to evaluate the vascular anatomy. Carotid stenosis measurements (when applicable) are obtained utilizing NASCET criteria, using the distal internal carotid diameter as the denominator. CONTRAST:  42mL OMNIPAQUE IOHEXOL 350 MG/ML SOLN COMPARISON:  Carotid Doppler ultrasound 02/15/2016. Chest CT 11/01/2018. FINDINGS: CTA NECK FINDINGS Aortic arch: Standard 3 vessel aortic arch with widely patent arch vessel origins. Right carotid system: Patent with mild calcified plaque at the carotid bifurcation. No evidence of significant stenosis or dissection. Left carotid system: Patent with minimal calcified plaque at the carotid bifurcation. No evidence of significant stenosis or dissection. Vertebral arteries: Patent without evidence of stenosis or dissection. Mildly dominant left vertebral artery. Skeleton: Mild cervical spondylosis.  No suspicious osseous lesion. Other neck: No mass or enlarged lymph nodes. Upper chest: Partially visualized moderate-sized right pleural effusion, larger than on the recent chest CT. Associated compressive atelectasis in the right lung. Review of the MIP images confirms the above findings CTA HEAD FINDINGS Anterior circulation: The internal carotid arteries are patent from skull base to carotid termini with mild nonstenotic plaque bilaterally. ACAs and MCAs are patent with mild branch vessel irregularity but no evidence of proximal branch occlusion or significant proximal stenosis. The right  ACA is dominant. No aneurysm is identified. Posterior circulation: The intracranial vertebral arteries are widely patent to the basilar. Patent left PICA, bilateral AICAs, and bilateral SCAs are identified. The basilar artery is widely patent. There is a fetal origin of the left PCA. A small right posterior communicating artery is present. Both PCAs are patent with branch vessel irregularity but no significant proximal stenosis. No aneurysm is identified. Venous sinuses: Patent. Anatomic variants: Fetal left PCA.  Dominant right ACA. Review of the MIP images confirms the above findings IMPRESSION: 1. Mild atherosclerosis in the head and neck without large vessel occlusion, significant stenosis, or aneurysm. 2. Moderate right pleural effusion, enlarged from 11/01/2018. These results were communicated to Dr. Lorraine Lax at 9:28 am on 11/04/2018 by text page via the [facility] messaging system. Electronically Signed   By: Logan Bores M.D.   On: 11/04/2018 09:38   Ct Head Code Stroke Wo Contrast`  Result Date: 11/04/2018 CLINICAL DATA:  Code stroke.  Left-sided weakness. EXAM: CT HEAD WITHOUT CONTRAST TECHNIQUE: Contiguous axial images were obtained from the base of the skull through the vertex without intravenous contrast. COMPARISON:  11/01/2018 FINDINGS: Brain: There is no evidence of acute infarct, intracranial hemorrhage, mass, midline shift, or extra-axial fluid collection. Chronic infarcts are again seen in the left frontal lobe, left parietal lobe, and bilateral cerebellum. A prominent dilated perivascular space in the left lentiform nucleus is unchanged. Patchy hypodensities in the cerebral white matter bilaterally are unchanged and nonspecific but compatible with moderate chronic small vessel ischemic disease. There is mild-to-moderate cerebral atrophy. A cavum septum pellucidum et vergae is again noted Vascular: Calcified atherosclerosis at the skull base. No hyperdense vessel. Skull: No fracture or focal osseous  lesion. Sinuses/Orbits: Left sphenoid sinus mucosal thickening. Clear mastoid air cells. Bilateral cataract extraction. Other: None ASPECTS ([place] Stroke Program Early CT Score) - Ganglionic level infarction (caudate, lentiform nuclei, internal capsule, insula, M1-M3 cortex): 7 - Supraganglionic infarction (M4-M6 cortex): 3 Total score (0-10 with 10 being normal): 10 IMPRESSION: 1. No evidence of acute intracranial abnormality. 2. ASPECTS is 10. 3. Moderate chronic small vessel ischemic disease with chronic infarcts as above. These  results were communicated to Dr. Lorraine Lax at 9:28 am on 11/04/2018 by text page via the [facility] messaging system. Electronically Signed   By: Logan Bores M.D.   On: 11/04/2018 09:28     ASSESSMENT AND PLAN   63 y.o. female with PMH of Ovarian cancer,CAD, CVA, HLD, HTN admitted for sepsis. Complaining of left side numbness yesterday, possibly weaker on left side few days ago. This morning patient had significant hemiparesis and was immediately taken to CT for CTA, LVO ruled out. Not a candidate for tPA, outside 4.5 hr window.    Acute Ischemic Stroke  Thrombocytopenia  Risk factors : Ovarian cancer,CAD, CVA, HLD, HTN Etiology: small vessel disease vs watershed vs embolic vs hypercoagulable state - to be determined  Recommendations # MRI of the brain without contrast #Transthoracic Echo  #OK to start antiplatelet:  low suspicion for bacterial endocarditis as blood cx negative x 3 days. Will need to weight risk vs benefit due to thrombocytopenia.  #Start or continue Atorvastatin 80 mg/other high intensity statin # BP goal: permissive HTN upto 180/110 mmHg, AVOID HYPOTENSION # HBAIC and Lipid profile # Telemetry monitoring # Frequent neuro checks # NPO until passes stroke swallow screen  Please page stroke NP  Or  PA  Or MD from 8am -4 pm  as this patient from this time will be  followed by the stroke.   You can look them up on [email or web address]  Password  [email or web address]  Addendum Reviewed MRI brain: she has multiple small infarcts in subcortical white matter as well right cerebellum: either small vessel vs embolic.  Unfortunately patient has worsening throbocytopenia Plavix 75mg  daily starting tomorrow( Allergy to ASA : rash) if thrombocytopenia improves ( PLTs>50,000).    Neurology will continue to follow.   Sushanth Aroor [facility] Pager Number [phone]

## 2018-11-04 NOTE — Progress Notes (Signed)
Head CT scan unremarkable for change.  MRI +strokes with MD notified. Pt has had aspirin, neuro consult, CT scan/MRI. Left arm flaccid currently with left leg weakness/ataxia, slight left facial droop. NIH 7.

## 2018-11-04 NOTE — Progress Notes (Signed)
Left arm remains flaccid; noted decreased strength left leg with foot flexion. Pt generally very weak/resting/sleeping frequent naps; talks to husband on phone; color pale;skin warm/dry. Appropriately verbal. SBP 99-100, Resp 28 shallow. Noted dried blood in right nare with no active bleeding noted- pt picking at nose and advised to discontinue.

## 2018-11-04 NOTE — Progress Notes (Signed)
[facility] [place] NAME: Destiny [name]    MR#:  [id number]  DATE OF BIRTH:  1956/02/03  SUBJECTIVE:   Patient this morning was having significant left-sided weakness with facial droop.  Underwent CTA of the head neck which was negative for large vessel disease, MRI is positive for a Small acute infarcts in the right corona radiata, subcortical right parietal lobe, and left cerebellum.   REVIEW OF SYSTEMS:    Review of Systems  Constitutional: Negative for chills and fever.  HENT: Negative for congestion and tinnitus.   Eyes: Negative for blurred vision and double vision.  Respiratory: Negative for cough, shortness of breath and wheezing.   Cardiovascular: Negative for chest pain, orthopnea and PND.  Gastrointestinal: Positive for nausea. Negative for abdominal pain, diarrhea and vomiting.  Genitourinary: Negative for dysuria and hematuria.  Neurological: Positive for weakness (generalized). Negative for dizziness, sensory change and focal weakness.  All other systems reviewed and are negative.   Nutrition: Heart Healthy Tolerating Diet: Yes Tolerating PT: Await Eval.   DRUG ALLERGIES:   Allergies  Allergen Reactions   Atorvastatin Hives    No s/sx of anaphylaxis, just intermittent hives.  Does not seem to be a class effect as she has tolerated lovastatin previously No s/sx of anaphylaxis, just intermittent hives.  Does not seem to be a class effect as she has tolerated lovastatin previously   Fluconazole Rash    Reaction to generic but not name brand Reaction to generic but not name brand CAN USE DIFLUCAN.DOES NOT BREAK OUT WITH DIFLUCAN.(allergic only to generic brand)   Sertraline Itching   Sulfa Antibiotics Rash and Other (See Comments)    "hot " sensation   Sulfamethoxazole-Trimethoprim Rash and Other (See Comments)    "felt hot" Other reaction(s): Other (See Comments) "felt hot"   Sulfasalazine Other (See  Comments) and Rash    "hot " sensation    VITALS:  Blood pressure 109/71, pulse (!) 113, temperature 98 F (36.7 C), temperature source Oral, resp. rate (!) 24, height 5' 2"  (1.575 m), weight 90.9 kg, SpO2 98 %.  PHYSICAL EXAMINATION:   Physical Exam  GENERAL:  64 y.o.-year-old patient lying in bed in no acute distress.  EYES: Pupils equal, round, reactive to light and accommodation. No scleral icterus. Extraocular muscles intact.  HEENT: Head atraumatic, normocephalic. Oropharynx and nasopharynx clear.  NECK:  Supple, no jugular venous distention. No thyroid enlargement, no tenderness.  LUNGS: Normal breath sounds bilaterally, no wheezing, rales, rhonchi. No use of accessory muscles of respiration.  CARDIOVASCULAR: S1, S2 normal. No murmurs, rubs, or gallops.  ABDOMEN: Soft, nontender, nondistended. Bowel sounds present. No organomegaly or mass.  EXTREMITIES: No cyanosis, clubbing or edema b/l.    NEUROLOGIC: Cranial nerves II through XII are intact.  Left-sided weakness more on the left upper extremity than the lower extremity.  Mild left-sided facial droop.   PSYCHIATRIC: The patient is alert and oriented x 3.  SKIN: No obvious rash, lesion, or ulcer.    LABORATORY PANEL:   CBC Recent Labs  Lab 11/04/18 0635  WBC 3.5*  HGB 7.9*  HCT 23.3*  PLT 39*   ------------------------------------------------------------------------------------------------------------------  Chemistries  Recent Labs  Lab 11/01/18 2145  11/03/18 0432 11/04/18 0635  NA 131*   < > 135 135  K 2.1*   < > 2.9* 3.3*  CL 94*   < > 103 105  CO2 23   < > 23 22  GLUCOSE 123*   < > 114* 103*  BUN 26*   < > 13 8  CREATININE 1.06*   < > 0.69 0.64  CALCIUM 8.1*   < > 8.0* 8.4*  MG 1.6*  --  2.0  --   AST 24  --   --   --   ALT 27  --   --   --   ALKPHOS 75  --   --   --   BILITOT 1.2  --   --   --    < > = values in this interval not displayed.    ------------------------------------------------------------------------------------------------------------------  Cardiac Enzymes Recent Labs  Lab 11/01/18 2145  TROPONINI <0.03   ------------------------------------------------------------------------------------------------------------------  RADIOLOGY:  Ct Angio Head W Or Wo Contrast  Result Date: 11/04/2018 CLINICAL DATA:  Left-sided weakness. EXAM: CT ANGIOGRAPHY HEAD AND NECK TECHNIQUE: Multidetector CT imaging of the head and neck was performed using the standard protocol during bolus administration of intravenous contrast. Multiplanar CT image reconstructions and MIPs were obtained to evaluate the vascular anatomy. Carotid stenosis measurements (when applicable) are obtained utilizing NASCET criteria, using the distal internal carotid diameter as the denominator. CONTRAST:  37m OMNIPAQUE IOHEXOL 350 MG/ML SOLN COMPARISON:  Carotid Doppler ultrasound 02/15/2016. Chest CT 11/01/2018. FINDINGS: CTA NECK FINDINGS Aortic arch: Standard 3 vessel aortic arch with widely patent arch vessel origins. Right carotid system: Patent with mild calcified plaque at the carotid bifurcation. No evidence of significant stenosis or dissection. Left carotid system: Patent with minimal calcified plaque at the carotid bifurcation. No evidence of significant stenosis or dissection. Vertebral arteries: Patent without evidence of stenosis or dissection. Mildly dominant left vertebral artery. Skeleton: Mild cervical spondylosis.  No suspicious osseous lesion. Other neck: No mass or enlarged lymph nodes. Upper chest: Partially visualized moderate-sized right pleural effusion, larger than on the recent chest CT. Associated compressive atelectasis in the right lung. Review of the MIP images confirms the above findings CTA HEAD FINDINGS Anterior circulation: The internal carotid arteries are patent from skull base to carotid termini with mild nonstenotic plaque  bilaterally. ACAs and MCAs are patent with mild branch vessel irregularity but no evidence of proximal branch occlusion or significant proximal stenosis. The right ACA is dominant. No aneurysm is identified. Posterior circulation: The intracranial vertebral arteries are widely patent to the basilar. Patent left PICA, bilateral AICAs, and bilateral SCAs are identified. The basilar artery is widely patent. There is a fetal origin of the left PCA. A small right posterior communicating artery is present. Both PCAs are patent with branch vessel irregularity but no significant proximal stenosis. No aneurysm is identified. Venous sinuses: Patent. Anatomic variants: Fetal left PCA.  Dominant right ACA. Review of the MIP images confirms the above findings IMPRESSION: 1. Mild atherosclerosis in the head and neck without large vessel occlusion, significant stenosis, or aneurysm. 2. Moderate right pleural effusion, enlarged from 11/01/2018. These results were communicated to Dr. ALorraine Laxat 9:28 am on 11/04/2018 by text page via the [facility] system. Electronically Signed   By: ALogan BoresM.D.   On: 11/04/2018 09:38   Ct Angio Neck W Or Wo Contrast  Result Date: 11/04/2018 CLINICAL DATA:  Left-sided weakness. EXAM: CT ANGIOGRAPHY HEAD AND NECK TECHNIQUE: Multidetector CT imaging of the head and neck was performed using the standard protocol during bolus administration of intravenous contrast. Multiplanar CT image reconstructions and MIPs were obtained to evaluate the vascular anatomy. Carotid stenosis measurements (when applicable) are obtained utilizing NASCET criteria, using the  distal internal carotid diameter as the denominator. CONTRAST:  71m OMNIPAQUE IOHEXOL 350 MG/ML SOLN COMPARISON:  Carotid Doppler ultrasound 02/15/2016. Chest CT 11/01/2018. FINDINGS: CTA NECK FINDINGS Aortic arch: Standard 3 vessel aortic arch with widely patent arch vessel origins. Right carotid system: Patent with mild calcified plaque at  the carotid bifurcation. No evidence of significant stenosis or dissection. Left carotid system: Patent with minimal calcified plaque at the carotid bifurcation. No evidence of significant stenosis or dissection. Vertebral arteries: Patent without evidence of stenosis or dissection. Mildly dominant left vertebral artery. Skeleton: Mild cervical spondylosis.  No suspicious osseous lesion. Other neck: No mass or enlarged lymph nodes. Upper chest: Partially visualized moderate-sized right pleural effusion, larger than on the recent chest CT. Associated compressive atelectasis in the right lung. Review of the MIP images confirms the above findings CTA HEAD FINDINGS Anterior circulation: The internal carotid arteries are patent from skull base to carotid termini with mild nonstenotic plaque bilaterally. ACAs and MCAs are patent with mild branch vessel irregularity but no evidence of proximal branch occlusion or significant proximal stenosis. The right ACA is dominant. No aneurysm is identified. Posterior circulation: The intracranial vertebral arteries are widely patent to the basilar. Patent left PICA, bilateral AICAs, and bilateral SCAs are identified. The basilar artery is widely patent. There is a fetal origin of the left PCA. A small right posterior communicating artery is present. Both PCAs are patent with branch vessel irregularity but no significant proximal stenosis. No aneurysm is identified. Venous sinuses: Patent. Anatomic variants: Fetal left PCA.  Dominant right ACA. Review of the MIP images confirms the above findings IMPRESSION: 1. Mild atherosclerosis in the head and neck without large vessel occlusion, significant stenosis, or aneurysm. 2. Moderate right pleural effusion, enlarged from 11/01/2018. These results were communicated to Dr. ALorraine Laxat 9:28 am on 11/04/2018 by text page via the [facility]aging system. Electronically Signed   By: ALogan BoresM.D.   On: 11/04/2018 09:38   Mr Brain Wo  Contrast  Result Date: 11/04/2018 CLINICAL DATA:  Left-sided numbness and weakness. History of ovarian cancer. EXAM: MRI HEAD WITHOUT CONTRAST TECHNIQUE: Multiplanar, multiecho pulse sequences of the brain and surrounding structures were obtained without intravenous contrast. COMPARISON:  Head CT 11/04/2018 and MRI 09/09/2011 FINDINGS: Brain: Small acute subcortical infarcts are present in the posterior aspect of the right corona radiata and posterior right parietal lobe. There is also a 1 cm acute infarct anteriorly in the left cerebellar hemisphere. No intracranial hemorrhage, mass, midline shift, or extra-axial fluid collection is identified. Chronic cortical infarcts are again seen in the left frontal and left parietal lobes. There also tiny chronic cortical infarcts in the right frontal lobe and right temporoparietal junction. Small chronic infarcts are also again seen in the cerebellum bilaterally. There are dilated perivascular spaces in the basal ganglia bilaterally including a 2 cm perivascular space on the left. A cavum septum pellucidum et vergae is incidentally noted. Patchy T2 hyperintensities in the cerebral white matter bilaterally have progressed from the prior MRI and are nonspecific but compatible with moderate chronic small vessel ischemic disease. Moderate cerebral atrophy has also mildly progressed from the prior MRI. Vascular: Major intracranial vascular flow voids are preserved. Skull and upper cervical spine: Unremarkable bone marrow signal. Sinuses/Orbits: Bilateral cataract extraction. Paranasal sinuses and mastoid air cells are clear. Other: None. IMPRESSION: 1. Small acute infarcts in the right corona radiata, subcortical right parietal lobe, and left cerebellum. 2. Moderate chronic small vessel ischemic disease with chronic infarcts as  above. Electronically Signed   By: Logan Bores M.D.   On: 11/04/2018 11:57   Ct Head Code Stroke Wo Contrast`  Result Date: 11/04/2018 CLINICAL  DATA:  Code stroke.  Left-sided weakness. EXAM: CT HEAD WITHOUT CONTRAST TECHNIQUE: Contiguous axial images were obtained from the base of the skull through the vertex without intravenous contrast. COMPARISON:  11/01/2018 FINDINGS: Brain: There is no evidence of acute infarct, intracranial hemorrhage, mass, midline shift, or extra-axial fluid collection. Chronic infarcts are again seen in the left frontal lobe, left parietal lobe, and bilateral cerebellum. A prominent dilated perivascular space in the left lentiform nucleus is unchanged. Patchy hypodensities in the cerebral white matter bilaterally are unchanged and nonspecific but compatible with moderate chronic small vessel ischemic disease. There is mild-to-moderate cerebral atrophy. A cavum septum pellucidum et vergae is again noted Vascular: Calcified atherosclerosis at the skull base. No hyperdense vessel. Skull: No fracture or focal osseous lesion. Sinuses/Orbits: Left sphenoid sinus mucosal thickening. Clear mastoid air cells. Bilateral cataract extraction. Other: None ASPECTS ([place] Stroke Program Early CT Score) - Ganglionic level infarction (caudate, lentiform nuclei, internal capsule, insula, M1-M3 cortex): 7 - Supraganglionic infarction (M4-M6 cortex): 3 Total score (0-10 with 10 being normal): 10 IMPRESSION: 1. No evidence of acute intracranial abnormality. 2. ASPECTS is 10. 3. Moderate chronic small vessel ischemic disease with chronic infarcts as above. These results were communicated to Dr. Lorraine Lax at 9:28 am on 11/04/2018 by text page via the [facility] messaging system. Electronically Signed   By: Logan Bores M.D.   On: 11/04/2018 09:28     ASSESSMENT AND PLAN:   63 year old female with past medical history of ovarian cancer, hypertension, hyperlipidemia, GERD, history of previous CVA, CHF, coronary disease, presented to the hospital due to weakness/altered mental status and also noted to be hypokalemic with suspected sepsis.  1.   Sepsis-patient met criteria admission given her fever, hypotension and suspected pneumonia on CT chest. -Improving.  Continue cefepime for now.  Cultures remain negative.  Patient presently afebrile and hemodynamically stable.  2.  Altered mental status/weakness- patient developed left-sided facial droop and left upper extremity weakness this morning.  Underwent CT of the head which was negative for acute pathology.  Neurology consult obtained.  Patient underwent CTA of the head neck which is negative for large vessel disease. - MRI is positive for right-sided coronary radiata and thalamic small vessel CVA. - As per neurology continue dual antiplatelet therapy and patient to be loaded with Plavix now.  Continue aspirin, Plavix, high-dose intensity statin. - PT OT and speech evaluation.  Await echocardiogram results.  3.  Hypokalemia-improving with supplementation will continue to monitor.  4.  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 - Follow counts.  No acute need for transfusion.  5.  GERD-continue Pepcid.  6.  History of urinary incontinence-continue oxybutynin.  7.  Anxiety-continue Paxil, Klonopin.  8. Hyperlipidemia - cont. Crestor.    9. Depression - cont. Effexor.   Discussed plan of care with patient's husband over the phone.  All the records are reviewed and case discussed with Care Management/Social Worker. Management plans discussed with the patient, family and they are in agreement.  CODE STATUS: Full code  DVT Prophylaxis: Lovenox  TOTAL TIME TAKING CARE OF THIS PATIENT: 45 minutes.   POSSIBLE D/C IN 2-3 DAYS, DEPENDING ON CLINICAL CONDITION.   Henreitta Leber M.D on 11/04/2018 at 12:52 PM  Between 7am to 6pm - Pager - [phone]  After 6pm go to [email or web address] -  password Airline pilot  [facility]  Office  [phone]  CC: Primary care physician; [name], MD

## 2018-11-04 NOTE — Progress Notes (Signed)
Initial Nutrition Assessment  RD working remotely.  DOCUMENTATION CODES:   Obesity unspecified  INTERVENTION:  Recommend liberalizing diet to 2 gram sodium.  Provide Ensure Enlive po BID, each supplement provides 350 kcal and 20 grams of protein.  Provide Magic cup TID with meals, each supplement provides 290 kcal and 9 grams of protein.  Provide Ocuvite daily for wound healing (provides zinc, vitamin A, vitamin C, Vitamin E, copper, and selenium).  NUTRITION DIAGNOSIS:   Inadequate oral intake related to decreased appetite as evidenced by meal completion < 50%.  GOAL:   Patient will meet greater than or equal to 90% of their needs  MONITOR:   PO intake, Supplement acceptance, Labs, Weight trends, Skin, I & O's  REASON FOR ASSESSMENT:   Consult Assessment of nutrition requirement/status  ASSESSMENT:   63 year old female with PMHx of sleep apnea, hx CVA x 3, GERD, CAD, HTN, HLD, CHF, hx ovarian cancer who is admitted with weakness/AMS, sepsis, suspected PNA.   Attempted to call patient's room for nutrition/weight history but did not get an answer. Noted patient was ordered for stat imaging this morning, so she may be out of the room for this. Per RN documentation in chart patient with very poor appetite and PO intake. She is refusing some meals and then eating small amounts of soft foods at other. It looks like yesterday she did have some yogurt, pudding, mashed potatoes, and juice.  Weight in chart appears to fluctuate between 99-106 kg. Weight currently 90.9 kg (200.5 lbs) but unsure how much of that loss may have been fluid.  Medications reviewed and include: famotidine, spironolactone 12.5 mg daily, cefepime, D5-1/2NS at 100 mL/hr.  Labs reviewed: Potassium 3.3.  NUTRITION - FOCUSED PHYSICAL EXAM:  Unable to complete at this time.  Diet Order:   Diet Order            Diet Heart Room service appropriate? Yes; Fluid consistency: Thin; Fluid restriction: 1200 mL  Fluid  Diet effective now             EDUCATION NEEDS:   Not appropriate for education at this time  Skin:  Skin Assessment: Skin Integrity Issues:(open incision to abdomen)  Last BM:  11/03/2018 - medium type 6  Height:   Ht Readings from Last 1 Encounters:  11/02/18 5\' 2"  (1.575 m)   Weight:   Wt Readings from Last 1 Encounters:  11/02/18 90.9 kg   Ideal Body Weight:     BMI:  Body mass index is 36.67 kg/m.  Estimated Nutritional Needs:   Kcal:  1700-1900  Protein:  90-100 grams  Fluid:  1.7-1.9 L/day  Willey Blade, MS, RD, LDN Office: [phone] Pager: [phone] After Hours/Weekend Pager: [phone]

## 2018-11-04 NOTE — Progress Notes (Signed)
*  PRELIMINARY RESULTS* Echocardiogram 2D Echocardiogram has been performed. A Bubble Study & Definity IV Contrast were performed on this study.  Destiny [name] Destiny [name] 11/04/2018, 3:07 PM

## 2018-11-04 NOTE — Evaluation (Signed)
Physical Therapy Evaluation Patient Details Name: Destiny [name] MRN: [id number] DOB: Sep 05, 1955 Today's Date: 11/04/2018   History of Present Illness  Destiny [name] is a 63 y.o. female who was recently discharged with recent very complicated and prolonged stays at [facility].  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 antibiotics for 14 days, she was apparently status post 2 doses of carboplatin and planning for chemotherapy as an outpatient when she arrived to hospital ED on 11/01/18 by EMS after her family reported that she had become unarousable. She was admitted to the hospital for hypotension, hypokalemia, chronic diastolic heart failure. On 11/04/18 pt developed left sided hemiparesis. MRI revealed small infarcts in subcortical white matter as well right cerebellum.  Relevant PMH includes CHF, CAD, CVA x3, female bladder prolapse, GERD, HTN, mitral valve disease, ovarian cancer, cholecystecotmy.    Clinical Impression  Patient was alert and oriented and able to provide a detailed prior medical history given enough time to overcome word-finding difficulties. Prior to hospitalization, she ambulated mod I with rollator, was I with all ADLs  except donning shoes/socks, and had help with IADLs. She lives with her husband at home in a single story apartment with no steps to enter. She has a tub/shower combo, shower chair and grab bars, rollator, BSC, bedside table, and 2 canes (that used to belong to a female relative). Upon physical therapy evaluation, patient required mod A for bed supine <> sit bed mobility and total A to scoot up in bed. Her HR was elevated at 111 bmp at baseline and was mildly diaphoretic. HR rose to 122 bpm after performing supine to sit. Patient was unable to control trunk in sitting without min A support and did not attempt transfer second to fatigue, vitals, and weakness. She was unable to contribute to mobility with left UE and demonstrated 2/5 left knee extension, 0/5 ankle dorsiflexion, and 1/5 ankle plantarflexion, hip adduction, abduction, and extension. HR returned to 111 bpm at end of session and BP was 100/79 in sem-trendelenburg position. O2 sat remained WFL throughout session. Patient would benefit from physical therapy to address remaining impairments and functional limitations to work towards return to PLOF or maximal functional independence. Patient has experienced a significant decline in functional independence and mobility and would benefit from short term rehab upon hospital discharge prior to going home.     Follow Up Recommendations SNF    Equipment Recommendations  Other (comment)(to be determined in next level of care; consider hemicane, W/C)    Recommendations for Other Services OT consult;Speech consult     Precautions / Restrictions Precautions Precautions: Fall;Other (comment) Precaution Comments: hemiparesis L arm and leg.  Restrictions Weight Bearing Restrictions: No      Mobility  Bed Mobility Overal bed  mobility: Needs Assistance Bed Mobility: Supine to Sit;Sit to Supine     Supine to sit: Mod assist Sit to supine: Mod assist   General bed mobility comments: required support  for trunk, left UE, and left LE during supine <> sit. Some activation of hip extensors, adductors, and abductors on left. Apparently no abililty to use L UE to contribute to transfer.   Transfers Overall transfer level: Needs assistance               General transfer comment: patient unable to attempt safely at this time. Poor trunk control. HR up to 122 after moving supine to sit.   Ambulation/Gait             General Gait Details: patient unable to attempt safely at this time. Poor trunk control. HR up to 122 after moving supine to sit.   Stairs            Wheelchair Mobility    Modified Rankin (Stroke Patients Only) Modified Rankin (Stroke Patients Only) Pre-Morbid Rankin Score: Moderate disability Modified Rankin: Severe disability     Balance Overall balance assessment: Needs assistance Sitting-balance support: Single extremity supported;Feet supported Sitting balance-Leahy Scale: Poor Sitting balance - Comments: unable to maintain trunk control unsupported in sitting. No abiltiy to contribute with left UE. Leans right and backwards.  Postural control: Posterior lean;Right lateral lean   Standing balance-Leahy Scale: Zero Standing balance comment: Unable to attempt at this time.                              Pertinent Vitals/Pain Pain Assessment: No/denies pain    Home Living Family/patient expects to be discharged to:: Private residence Living Arrangements: Spouse/significant other Available Help at Discharge: Family;Available 24 hours/day Type of Home: Apartment Home Access: Level entry     Home Layout: One level Home Equipment: Walker - 4 wheels;Bedside commode;Shower seat;Grab bars - tub/shower(2 canes of unknown type; bedside table)      Prior Function Level of Independence: Needs assistance   Gait / Transfers Assistance Needed: patient reports she ambulated mod I with rollator walker  ADL's / Homemaking Assistance Needed:  report she was I with all ADLs except donning shoes and socks; needed help with IADLs.         Hand Dominance        Extremity/Trunk Assessment   Upper Extremity Assessment Upper Extremity Assessment: LUE deficits/detail;RUE deficits/detail RUE Deficits / Details: Generalized weakness.  LUE Deficits / Details: No notable voluntary activation of left arm. Some proximal activation noted with noxious stimulus to finger.  LUE Sensation: decreased light touch;decreased proprioception LUE Coordination: decreased gross motor;decreased fine motor    Lower Extremity Assessment Lower Extremity Assessment: RLE deficits/detail;LLE deficits/detail RLE Deficits / Details: generalized weakness LLE Deficits / Details: trace activation of toe movement. Knee extension 2/5; ankle plantarflexion 1/5. No discernable activation in dorsiflexion. Hip adductors/abductors 1/5.  LLE Sensation: decreased light touch;decreased proprioception LLE Coordination: decreased fine motor;decreased gross motor    Cervical / Trunk Assessment Cervical / Trunk Assessment: Other exceptions Cervical / Trunk Exceptions: healing abdominal incision. stooped posture, unable to control trunk with lean to right and back in sitting.   Communication   Communication: Expressive difficulties  Cognition Arousal/Alertness: Awake/alert Behavior During Therapy: WFL for tasks assessed/performed Overall Cognitive Status: Within Functional Limits for tasks assessed  General Comments: Noted for word finding difficulties but able to express self. A&Ox4      General Comments      Exercises Other Exercises Other Exercises: left quad sets in reclined position x 5.    Assessment/Plan    PT Assessment Patient needs continued PT services  PT Problem List Decreased strength;Decreased mobility;Impaired tone;Decreased range of motion;Decreased coordination;Decreased knowledge of  precautions;Obesity;Decreased activity tolerance;Decreased cognition;Cardiopulmonary status limiting activity;Decreased skin integrity;Decreased balance;Decreased knowledge of use of DME;Impaired sensation       PT Treatment Interventions DME instruction;Functional mobility training;Balance training;Patient/family education;Gait training;Therapeutic activities;Neuromuscular re-education;Therapeutic exercise;Cognitive remediation    PT Goals (Current goals can be found in the Care Plan section)  Acute Rehab PT Goals Patient Stated Goal: return home, regain use of left extremities PT Goal Formulation: With patient Time For Goal Achievement: 11/18/18 Potential to Achieve Goals: Fair    Frequency 7X/week   Barriers to discharge Inaccessible home environment;Decreased caregiver support will require max A for care at this point.     Co-evaluation               AM-PAC PT "6 Clicks" Mobility  Outcome Measure Help needed turning from your back to your side while in a flat bed without using bedrails?: A Lot Help needed moving from lying on your back to sitting on the side of a flat bed without using bedrails?: A Lot Help needed moving to and from a bed to a chair (including a wheelchair)?: Total Help needed standing up from a chair using your arms (e.g., wheelchair or bedside chair)?: Total Help needed to walk in hospital room?: Total Help needed climbing 3-5 steps with a railing? : Total 6 Click Score: 8    End of Session Equipment Utilized During Treatment: Gait belt Activity Tolerance: Patient limited by fatigue;Patient tolerated treatment well Patient left: in bed;with call bell/phone within reach;with nursing/sitter in room(NT attending to pt) Nurse Communication: Mobility status(results of session; request from MD to hold PT/OT for 24 hours. ) PT Visit Diagnosis: Unsteadiness on feet (R26.81);Muscle weakness (generalized) (M62.81);Other abnormalities of gait and mobility  (R26.89);Hemiplegia and hemiparesis Hemiplegia - Right/Left: Left Hemiplegia - caused by: Cerebral infarction    Time: 7322-0254 PT Time Calculation (min) (ACUTE ONLY): 32 min   Charges:   PT Evaluation $PT Eval Moderate Complexity: 1 Mod PT Treatments $Therapeutic Activity: 8-22 mins        Everlean Alstrom. Graylon Good, PT, DPT 11/04/18, 4:59 PM

## 2018-11-04 NOTE — Progress Notes (Signed)
PT Cancellation Note  Patient Details Name: Destiny [name] MRN: [id number] DOB: 10-Dec-1955   Cancelled Treatment:    Reason Eval/Treat Not Completed: Patient at procedure or test/unavailable(pt undergoing echocardiogram, will attempt again tomorrow if medically appropriate)  Everlean Alstrom. Graylon Good, PT, DPT 11/04/18, 2:46 PM

## 2018-11-04 NOTE — Progress Notes (Addendum)
Lunch sat up for pt with pt feeding herself-ate small amount. Dr. Verdell Carmine and Dr. Lorraine Lax updated on pt condition. Pt unable to receive anticoagulant related to platelet count. Pt talking phone to husband.  NS bolus IV started with pt positioned flat per Dr.Aroor. Will continue neuro checks/vs's/monitor for changes in neuro status.

## 2018-11-05 DIAGNOSIS — I639 Cerebral infarction, unspecified: Secondary | ICD-10-CM

## 2018-11-05 DIAGNOSIS — C763 Malignant neoplasm of pelvis: Secondary | ICD-10-CM

## 2018-11-05 DIAGNOSIS — D696 Thrombocytopenia, unspecified: Secondary | ICD-10-CM

## 2018-11-05 LAB — BASIC METABOLIC PANEL
Anion gap: 10 (ref 5–15)
BUN: 8 mg/dL (ref 8–23)
CO2: 22 mmol/L (ref 22–32)
Calcium: 8.2 mg/dL — ABNORMAL LOW (ref 8.9–10.3)
Chloride: 103 mmol/L (ref 98–111)
Creatinine, Ser: 0.6 mg/dL (ref 0.44–1.00)
GFR calc Af Amer: >60 mL/min (ref >60)
GFR calc non Af Amer: >60 mL/min (ref >60)
Glucose, Bld: 106 mg/dL — ABNORMAL HIGH (ref 70–99)
Potassium: 2.7 mmol/L — CL (ref 3.5–5.1)
Sodium: 135 mmol/L (ref 135–145)

## 2018-11-05 LAB — CBC
HCT: 23.2 % — ABNORMAL LOW (ref 36.0–46.0)
Hemoglobin: 7.6 g/dL — ABNORMAL LOW (ref 12.0–15.0)
MCH: 29.1 pg (ref 26.0–34.0)
MCHC: 32.8 g/dL (ref 30.0–36.0)
MCV: 88.9 fL (ref 80.0–100.0)
Platelets: 26 10*3/uL — CL (ref 150–400)
RBC: 2.61 MIL/uL — ABNORMAL LOW (ref 3.87–5.11)
RDW: 14.6 % (ref 11.5–15.5)
WBC: 2.7 10*3/uL — ABNORMAL LOW (ref 4.0–10.5)
nRBC: 0 % (ref 0.0–0.2)

## 2018-11-05 LAB — SARS CORONAVIRUS 2 BY RT PCR (HOSPITAL ORDER, PERFORMED IN ~~LOC~~ HOSPITAL LAB): SARS Coronavirus 2: NEGATIVE

## 2018-11-05 LAB — HEMOGLOBIN A1C
Hgb A1c MFr Bld: 6.1 % — ABNORMAL HIGH (ref 4.8–5.6)
Mean Plasma Glucose: 128 mg/dL

## 2018-11-05 LAB — POTASSIUM: Potassium: 3.3 mmol/L — ABNORMAL LOW (ref 3.5–5.1)

## 2018-11-05 LAB — LIPID PANEL
Cholesterol: 128 mg/dL (ref 0–200)
HDL: 22 mg/dL — ABNORMAL LOW (ref >40)
LDL Cholesterol: 68 mg/dL (ref 0–99)
Total CHOL/HDL Ratio: 5.8 RATIO
Triglycerides: 191 mg/dL — ABNORMAL HIGH (ref <150)
VLDL: 38 mg/dL (ref 0–40)

## 2018-11-05 MED ORDER — ALBUTEROL SULFATE HFA 108 (90 BASE) MCG/ACT IN AERS
2.0000 | INHALATION_SPRAY | Freq: Four times a day (QID) | RESPIRATORY_TRACT | Status: DC | PRN
  Filled 2018-11-05: qty 6.7

## 2018-11-05 MED ORDER — POTASSIUM CHLORIDE CRYS ER 20 MEQ PO TBCR
40.0000 meq | EXTENDED_RELEASE_TABLET | ORAL | Status: AC
  Administered 2018-11-05 (×2): 40 meq via ORAL
  Filled 2018-11-05 (×2): qty 2

## 2018-11-05 MED ORDER — SODIUM CHLORIDE 0.9 % IV SOLN
INTRAVENOUS | Status: DC | PRN
  Administered 2018-11-05 (×2): 250 mL via INTRAVENOUS
  Administered 2018-11-06: 14:00:00 5 mL via INTRAVENOUS
  Administered 2018-11-07: 02:00:00 20 mL via INTRAVENOUS

## 2018-11-05 NOTE — Progress Notes (Signed)
PT Cancellation Note  Patient Details Name: Destiny [name] MRN: [id number] DOB: [birth year]   Cancelled Treatment:    Reason Eval/Treat Not Completed: Medical issues which prohibited therapy.  Pt's potassium noted to be decreased to 2.7 this morning.  Per PT guidelines for low potassium, will hold PT at this time and will re-attempt PT treatment session at a later date/time.  Leitha Bleak, PT 11/05/18, 8:32 AM [phone]

## 2018-11-05 NOTE — Progress Notes (Signed)
MD notified of potassium level of 2.7. Pt VS stable, Pt currently sleeping. CPAP on. No distress noted. Will continue to monitor.

## 2018-11-05 NOTE — TOC Progression Note (Signed)
Transition of Care [facility]) - Progression Note    Patient Details  Name: Destiny [name] MRN: [id number] Date of Birth: [birth year]  [facility] CM/SW Contact  Su Hilt, RN Phone Number: 11/05/2018, 12:34 PM  Clinical Narrative:     Patient has no insurance and is set up with charity care thru [place] and [facility] health, unable to go to SNF without insurance, patient states she understands that she can't get into rehab easily without insurance is is ok going home with her [facility] set up thru [place]  Expected Discharge Plan: [place] Barriers to Discharge: Continued Medical Work up  Expected Discharge Plan and Services Expected Discharge Plan: [place]   Discharge Planning Services: CM Consult Post Acute Care Choice: [place] arrangements for the past 2 months: Single Family Home Expected Discharge Date: 11/06/18                 DME Agency: (home health already in place)       Social Determinants of Health (SDOH) Interventions    Readmission Risk Interventions No flowsheet data found.

## 2018-11-05 NOTE — Consult Note (Signed)
Hematology/Oncology Consult note [facility] Telephone:([phone] [phone]  Patient Care Team: [name], MD as PCP - General (Family Medicine)   Name of the patient: Destiny [name]  [id number]  12-18-1955   Date of visit: 11/05/18 REASON FOR COSULTATION:  Thrombocytopenia, we are history of ovarian cancer History of presenting illness-  63 y.o. female with PMH listed at below including recent diagnosis of stage IV high-grade serous serous carcinoma who underwent bilateral salpingo-oophorectomy 07/24/5100 complicated by bowel perforation status post exploratory laparotomy, small bowel resection, and reanastomosis, debridement 08/31/2018,  Who went to [facility] ED for evaluation of somnolence/altered mental status.  Patient was found to have hypotension, hypokalemia also pneumonia/meeting sepsis criteria.  Admitted on 11/02/2018 patient was started on hydration, IV antibiotics.  Blood pressure improved..  She developed left arm numbness and significantly weaker strength of left arm and left leg, mild dysarthria and facial droop on 11/04/2018 stat CT head was performed which showed no hemorrhagic changes and a CTA shows no LVO MRI of brain 11/04/2018 showed small acute infarcts in the right corona Redit[employer], subcortical right parietal lobe, left cerebellum. Patient was evaluated by neurology.  Was recommended to start antiplatelet.  However patient's thrombocytopenia got worse  during hospitalization.  Hematology oncology was consulted for further evaluation and management.  Echocardiogram shows no cardiac source of emboli with an EF of 60-65%.   Patient was seen and evaluated at bedside. She has chronic hearing loss,  poor historian and not able to report much history. Denies pain. I talked to her husband and he told me that patient has recently history of Ovarian cancer and had one cycle of chemotherapy treatment. She follows up with Dr.Soper and was seen by him on  09/14/2018.  I reviewed her medical chart at [facility] via care everywhere. I am not able to view her chemotherapy record.    Review of Systems  Unable to perform ROS: Mental status change  HENT:         Chronic hearing loss  Gastrointestinal: Positive for nausea.  Neurological:       Left arm and leg weakness    Allergies  Allergen Reactions   Atorvastatin Hives    No s/sx of anaphylaxis, just intermittent hives.  Does not seem to be a class effect as she has tolerated lovastatin previously No s/sx of anaphylaxis, just intermittent hives.  Does not seem to be a class effect as she has tolerated lovastatin previously   Fluconazole Rash    Reaction to generic but not name brand Reaction to generic but not name brand CAN USE DIFLUCAN.DOES NOT BREAK OUT WITH DIFLUCAN.(allergic only to generic brand)   Sertraline Itching   Sulfa Antibiotics Rash and Other (See Comments)    "hot " sensation   Sulfamethoxazole-Trimethoprim Rash and Other (See Comments)    "felt hot" Other reaction(s): Other (See Comments) "felt hot"   Sulfasalazine Other (See Comments) and Rash    "hot " sensation    Patient Active Problem List   Diagnosis Date Noted   HLD (hyperlipidemia) 11/02/2018   GERD (gastroesophageal reflux disease) 11/02/2018   CAD (coronary artery disease) 11/02/2018   Sleep apnea 11/02/2018   Hypotension 11/02/2018   Somnolence 11/02/2018   Chronic diastolic heart failure ([place]) 11/15/2017   HTN (hypertension) 11/15/2017   Hypokalemia 11/05/2017   Chest pain 02/14/2016   Small bowel obstruction ([place]) 06/16/2015     Past Medical History:  Diagnosis Date   CAD (coronary artery disease)  CHF (congestive heart failure) (HCC)    CVA (cerebral infarction)    Female bladder prolapse    GERD (gastroesophageal reflux disease)    Hyperlipidemia    Hypertension    Hypokalemia    Mitral valve disease    Ovarian ca ([place])    Sleep apnea    Stroke ([place])     x3     Past Surgical History:  Procedure Laterality Date   CARPAL TUNNEL RELEASE Bilateral    CATARACT EXTRACTION Right 11/22/2017   CHOLECYSTECTOMY     COLON SURGERY     TENNIS ELBOW RELEASE/NIRSCHEL PROCEDURE Left    TONSILLECTOMY      Social History   Socioeconomic History   Marital status: Married    Spouse name: Not on file   Number of children: Not on file   Years of education: Not on file   Highest education level: Not on file  Occupational History   Not on file  Social Needs   Financial resource strain: Not on file   Food insecurity:    Worry: Not on file    Inability: Not on file   Transportation needs:    Medical: Not on file    Non-medical: Not on file  Tobacco Use   Smoking status: Never Smoker   Smokeless tobacco: Never Used  Substance and Sexual Activity   Alcohol use: No    Alcohol/week: 0.0 standard drinks   Drug use: No   Sexual activity: Not Currently  Lifestyle   Physical activity:    Days per week: Not on file    Minutes per session: Not on file   Stress: Not on file  Relationships   Social connections:    Talks on phone: Not on file    Gets together: Not on file    Attends religious service: Not on file    Active member of club or organization: Not on file    Attends meetings of clubs or organizations: Not on file    Relationship status: Not on file   Intimate partner violence:    Fear of current or ex partner: Not on file    Emotionally abused: Not on file    Physically abused: Not on file    Forced sexual activity: Not on file  Other Topics Concern   Not on file  Social History Narrative   Not on file     Family History  Problem Relation Age of Onset   Heart disease Other    Hypertension Other    Rheum arthritis Mother    CAD Father      Current Facility-Administered Medications:    0.9 %  sodium chloride infusion, , Intravenous, PRN, Henreitta Leber, MD, Stopped at 11/05/18 1237    acetaminophen (TYLENOL) tablet 650 mg, 650 mg, Oral, Q6H PRN, 650 mg at 11/03/18 0003 **OR** acetaminophen (TYLENOL) suppository 650 mg, 650 mg, Rectal, Q6H PRN, Lance Coon, MD   albuterol (PROVENTIL) (2.5 MG/3ML) 0.083% nebulizer solution 2.5 mg, 2.5 mg, Nebulization, Q6H PRN, Sainani, Vivek J, MD   alteplase (CATHFLO ACTIVASE) injection 2 mg, 2 mg, Intracatheter, Once PRN, Verdell Carmine, Belia Heman, MD   anticoagulant sodium citrate solution 5 mL, 5 mL, Intracatheter, PRN, Sainani, Belia Heman, MD   ceFEPIme (MAXIPIME) 2 g in sodium chloride 0.9 % 100 mL IVPB, 2 g, Intravenous, Q8H, Salary, Montell D, MD, Stopped at 11/05/18 1140   clonazePAM (KLONOPIN) tablet 0.5 mg, 0.5 mg, Oral, QHS PRN, Henreitta Leber, MD   famotidine (  PEPCID) tablet 20 mg, 20 mg, Oral, BID, Verdell Carmine, Belia Heman, MD, 20 mg at 11/05/18 0905   feeding supplement (ENSURE ENLIVE) (ENSURE ENLIVE) liquid 237 mL, 237 mL, Oral, BID BM, Sainani, Belia Heman, MD, 237 mL at 11/04/18 1455   heparin lock flush 100 unit/mL, 250 Units, Intracatheter, PRN, Sainani, Belia Heman, MD   multivitamin-lutein (OCUVITE-LUTEIN) capsule 1 capsule, 1 capsule, Oral, Daily, Verdell Carmine, Belia Heman, MD, 1 capsule at 11/05/18 1051   ondansetron (ZOFRAN) tablet 4 mg, 4 mg, Oral, Q6H PRN, 4 mg at 11/03/18 0853 **OR** ondansetron (ZOFRAN) injection 4 mg, 4 mg, Intravenous, Q6H PRN, Lance Coon, MD   oxybutynin (DITROPAN) tablet 5 mg, 5 mg, Oral, BID, Verdell Carmine, Belia Heman, MD, 5 mg at 11/05/18 2130   PARoxetine (PAXIL) tablet 30 mg, 30 mg, Oral, Daily, Sainani, Belia Heman, MD, 30 mg at 11/05/18 1051   pregabalin (LYRICA) capsule 150 mg, 150 mg, Oral, QHS, Sainani, Belia Heman, MD, 150 mg at 11/04/18 2026   pregabalin (LYRICA) capsule 75 mg, 75 mg, Oral, BID, Henreitta Leber, MD, 75 mg at 11/05/18 1242   rosuvastatin (CRESTOR) tablet 20 mg, 20 mg, Oral, QHS, Sainani, Vivek J, MD, 20 mg at 11/04/18 2025   sodium chloride flush (NS) 0.9 % injection 3 mL, 3 mL, Intravenous, Q12H,  Mody, Sital, MD, 3 mL at 11/05/18 1051   spironolactone (ALDACTONE) tablet 12.5 mg, 12.5 mg, Oral, Daily, Sainani, Vivek J, MD, 12.5 mg at 11/04/18 0743   venlafaxine XR (EFFEXOR-XR) 24 hr capsule 75 mg, 75 mg, Oral, Q breakfast, Henreitta Leber, MD, 75 mg at 11/05/18 0904   Physical exam: Vitals:   11/04/18 2344 11/05/18 0432 11/05/18 0830 11/05/18 1239  BP: 104/71 103/72 103/71 105/76  Pulse: 94 95 (!) 103 93  Resp: 20 (!) 21 18 18   Temp: 98.4 F (36.9 C) 99.3 F (37.4 C) 98 F (36.7 C) 98.1 F (36.7 C)  TempSrc: Oral Axillary Oral Oral  SpO2: 96% 95% 97% 97%  Weight:      Height:       Physical Exam  Constitutional: No distress.  Confused  HENT:  Head: Normocephalic.  Neck: Normal range of motion.  Cardiovascular: Normal rate.  Pulmonary/Chest: Effort normal.  Abdominal: Soft. She exhibits no distension.  Neurological: She is alert.  Does not follow commands. Right upper extremity and lower extremity 5 out of 5 Left upper extremity 0 out of 5, left lower extremity 0-1 out of 5.  Skin: Skin is warm and dry.        CMP Latest Ref Rng & Units 11/05/2018  Glucose 70 - 99 mg/dL 106(H)  BUN 8 - 23 mg/dL 8  Creatinine 0.44 - 1.00 mg/dL 0.60  Sodium 135 - 145 mmol/L 135  Potassium 3.5 - 5.1 mmol/L 2.7(LL)  Chloride 98 - 111 mmol/L 103  CO2 22 - 32 mmol/L 22  Calcium 8.9 - 10.3 mg/dL 8.2(L)  Total Protein 6.5 - 8.1 g/dL -  Total Bilirubin 0.3 - 1.2 mg/dL -  Alkaline Phos 38 - 126 U/L -  AST 15 - 41 U/L -  ALT 0 - 44 U/L -   CBC Latest Ref Rng & Units 11/05/2018  WBC 4.0 - 10.5 K/uL 2.7(L)  Hemoglobin 12.0 - 15.0 g/dL 7.6(L)  Hematocrit 36.0 - 46.0 % 23.2(L)  Platelets 150 - 400 K/uL 26(LL)   RADIOGRAPHIC STUDIES: I have personally reviewed the radiological images as listed and agreed with the findings in the report. Ct Abdomen Pelvis Wo  Contrast  Result Date: 11/01/2018 CLINICAL DATA:  Possible cardiac arrest/hypotension, history of ovarian carcinoma EXAM:  CT ABDOMEN AND PELVIS WITHOUT CONTRAST TECHNIQUE: Multidetector CT imaging of the abdomen and pelvis was performed following the standard protocol without IV contrast. COMPARISON:  10/16/2018 FINDINGS: Lower chest: Mild right basilar atelectasis is noted. Stable right-sided pleural effusion is seen. Hepatobiliary: No focal liver abnormality is seen. Status post cholecystectomy. No biliary dilatation. Pancreas: Unremarkable. No pancreatic ductal dilatation or surrounding inflammatory changes. Spleen: Normal in size without focal abnormality. Adrenals/Urinary Tract: Adrenal glands are within normal limits. Kidneys are well visualized without obstructive change. Scattered small left renal calculi are noted better appreciated on the noncontrast study. The bladder is decompressed by Foley catheter. Stomach/Bowel: Colon is predominately decompressed. No obstructive changes are seen. The appendix is not well visualized. No inflammatory changes to suggest appendicitis are seen. No small bowel obstructive changes are noted. Stomach is decompressed. Vascular/Lymphatic: Aortic atherosclerosis. No enlarged abdominal or pelvic lymph nodes. Reproductive: Uterus is stable in appearance. The ovaries are not visualized consistent with the recent surgical history. Other: Some mild peritoneal thickening is noted consistent with the patient's known history of omental disease. No free fluid is seen. Postsurgical changes are noted anteriorly. The degree of fluid along the surgical wound has decreased. Musculoskeletal: Degenerative changes of the lumbar spine are noted. No lytic or sclerotic lesions are seen. IMPRESSION: Stable right pleural effusion and right basilar atelectasis. Postoperative changes. 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 No acute abnormality is noted. Electronically Signed   By: Inez Catalina M.D.   On: 11/01/2018 23:42   Ct Angio Head W Or Wo Contrast  Result Date:  11/04/2018 CLINICAL DATA:  Left-sided weakness. EXAM: CT ANGIOGRAPHY HEAD AND NECK TECHNIQUE: Multidetector CT imaging of the head and neck was performed using the standard protocol during bolus administration of intravenous contrast. Multiplanar CT image reconstructions and MIPs were obtained to evaluate the vascular anatomy. Carotid stenosis measurements (when applicable) are obtained utilizing NASCET criteria, using the distal internal carotid diameter as the denominator. CONTRAST:  74mL OMNIPAQUE IOHEXOL 350 MG/ML SOLN COMPARISON:  Carotid Doppler ultrasound 02/15/2016. Chest CT 11/01/2018. FINDINGS: CTA NECK FINDINGS Aortic arch: Standard 3 vessel aortic arch with widely patent arch vessel origins. Right carotid system: Patent with mild calcified plaque at the carotid bifurcation. No evidence of significant stenosis or dissection. Left carotid system: Patent with minimal calcified plaque at the carotid bifurcation. No evidence of significant stenosis or dissection. Vertebral arteries: Patent without evidence of stenosis or dissection. Mildly dominant left vertebral artery. Skeleton: Mild cervical spondylosis.  No suspicious osseous lesion. Other neck: No mass or enlarged lymph nodes. Upper chest: Partially visualized moderate-sized right pleural effusion, larger than on the recent chest CT. Associated compressive atelectasis in the right lung. Review of the MIP images confirms the above findings CTA HEAD FINDINGS Anterior circulation: The internal carotid arteries are patent from skull base to carotid termini with mild nonstenotic plaque bilaterally. ACAs and MCAs are patent with mild branch vessel irregularity but no evidence of proximal branch occlusion or significant proximal stenosis. The right ACA is dominant. No aneurysm is identified. Posterior circulation: The intracranial vertebral arteries are widely patent to the basilar. Patent left PICA, bilateral AICAs, and bilateral SCAs are identified. The  basilar artery is widely patent. There is a fetal origin of the left PCA. A small right posterior communicating artery is present. Both PCAs are patent with branch vessel irregularity but no significant proximal  stenosis. No aneurysm is identified. Venous sinuses: Patent. Anatomic variants: Fetal left PCA.  Dominant right ACA. Review of the MIP images confirms the above findings IMPRESSION: 1. Mild atherosclerosis in the head and neck without large vessel occlusion, significant stenosis, or aneurysm. 2. Moderate right pleural effusion, enlarged from 11/01/2018. These results were communicated to Dr. Lorraine Lax at 9:28 am on 11/04/2018 by text page via the [facility] messaging system. Electronically Signed   By: Logan Bores M.D.   On: 11/04/2018 09:38   Ct Head Wo Contrast  Result Date: 11/02/2018 CLINICAL DATA:  Increased somnolence EXAM: CT HEAD WITHOUT CONTRAST TECHNIQUE: Contiguous axial images were obtained from the base of the skull through the vertex without intravenous contrast. COMPARISON:  11/13/2017 FINDINGS: Brain: Mild atrophic changes are noted. Stable chronic white matter ischemic changes are seen. Areas of prior frontal and parietal infarcts on the left are noted and stable. Dilated perivascular space in the left lentiform nucleus is again seen and stable. No findings to suggest acute hemorrhage, acute infarction or space-occupying mass lesion are noted. Cavum septum pellucidum is again seen. Vascular: No hyperdense vessel or unexpected calcification. Skull: Normal. Negative for fracture or focal lesion. Sinuses/Orbits: No acute finding. Other: None. IMPRESSION: Chronic changes without acute abnormality. Electronically Signed   By: Inez Catalina M.D.   On: 11/02/2018 00:25   Ct Angio Neck W Or Wo Contrast  Result Date: 11/04/2018 CLINICAL DATA:  Left-sided weakness. EXAM: CT ANGIOGRAPHY HEAD AND NECK TECHNIQUE: Multidetector CT imaging of the head and neck was performed using the standard protocol during  bolus administration of intravenous contrast. Multiplanar CT image reconstructions and MIPs were obtained to evaluate the vascular anatomy. Carotid stenosis measurements (when applicable) are obtained utilizing NASCET criteria, using the distal internal carotid diameter as the denominator. CONTRAST:  50mL OMNIPAQUE IOHEXOL 350 MG/ML SOLN COMPARISON:  Carotid Doppler ultrasound 02/15/2016. Chest CT 11/01/2018. FINDINGS: CTA NECK FINDINGS Aortic arch: Standard 3 vessel aortic arch with widely patent arch vessel origins. Right carotid system: Patent with mild calcified plaque at the carotid bifurcation. No evidence of significant stenosis or dissection. Left carotid system: Patent with minimal calcified plaque at the carotid bifurcation. No evidence of significant stenosis or dissection. Vertebral arteries: Patent without evidence of stenosis or dissection. Mildly dominant left vertebral artery. Skeleton: Mild cervical spondylosis.  No suspicious osseous lesion. Other neck: No mass or enlarged lymph nodes. Upper chest: Partially visualized moderate-sized right pleural effusion, larger than on the recent chest CT. Associated compressive atelectasis in the right lung. Review of the MIP images confirms the above findings CTA HEAD FINDINGS Anterior circulation: The internal carotid arteries are patent from skull base to carotid termini with mild nonstenotic plaque bilaterally. ACAs and MCAs are patent with mild branch vessel irregularity but no evidence of proximal branch occlusion or significant proximal stenosis. The right ACA is dominant. No aneurysm is identified. Posterior circulation: The intracranial vertebral arteries are widely patent to the basilar. Patent left PICA, bilateral AICAs, and bilateral SCAs are identified. The basilar artery is widely patent. There is a fetal origin of the left PCA. A small right posterior communicating artery is present. Both PCAs are patent with branch vessel irregularity but no  significant proximal stenosis. No aneurysm is identified. Venous sinuses: Patent. Anatomic variants: Fetal left PCA.  Dominant right ACA. Review of the MIP images confirms the above findings IMPRESSION: 1. Mild atherosclerosis in the head and neck without large vessel occlusion, significant stenosis, or aneurysm. 2. Moderate right pleural effusion, enlarged  from 11/01/2018. These results were communicated to Dr. Lorraine Lax at 9:28 am on 11/04/2018 by text page via the [facility] messaging system. Electronically Signed   By: Logan Bores M.D.   On: 11/04/2018 09:38   Ct Chest Wo Contrast  Result Date: 11/02/2018 CLINICAL DATA:  History of ovarian carcinoma and hypotension EXAM: CT CHEST WITHOUT CONTRAST TECHNIQUE: Multidetector CT imaging of the chest was performed following the standard protocol without IV contrast. COMPARISON:  Chest x-ray from earlier in the same day, CT from 11/13/2017 FINDINGS: Cardiovascular: Thoracic aorta shows atherosclerotic calcifications without aneurysmal dilatation. No cardiac enlargement is noted. Coronary calcifications are seen. The pulmonary artery is within normal limits. A few small air bubbles are noted within the main pulmonary artery elated to the IV start. Mediastinum/Nodes: Nodular changes in the thyroid are again noted stable from the prior exam consistent with underlying goiter. No significant hilar or mediastinal adenopathy is noted. The esophagus as visualized is within normal limits. Lungs/Pleura: Mild left basilar atelectatic changes are noted. Right-sided pleural effusion is noted with some patchy basilar infiltrate/atelectasis. No significant nodularity is noted. Upper Abdomen: Within normal limits as previously seen on CT of the abdomen. Musculoskeletal: Degenerative changes of the thoracic spine are noted. No acute bony abnormality is noted. IMPRESSION: Stable nodular changes within the thyroid consistent with goiter. Right-sided pleural effusion with underlying  atelectasis/infiltrate. Mild left basilar atelectasis is noted as well. Aortic Atherosclerosis (ICD10-I70.0). Electronically Signed   By: Inez Catalina M.D.   On: 11/02/2018 00:28   Mr Brain Wo Contrast  Result Date: 11/04/2018 CLINICAL DATA:  Left-sided numbness and weakness. History of ovarian cancer. EXAM: MRI HEAD WITHOUT CONTRAST TECHNIQUE: Multiplanar, multiecho pulse sequences of the brain and surrounding structures were obtained without intravenous contrast. COMPARISON:  Head CT 11/04/2018 and MRI 09/09/2011 FINDINGS: Brain: Small acute subcortical infarcts are present in the posterior aspect of the right corona radiata and posterior right parietal lobe. There is also a 1 cm acute infarct anteriorly in the left cerebellar hemisphere. No intracranial hemorrhage, mass, midline shift, or extra-axial fluid collection is identified. Chronic cortical infarcts are again seen in the left frontal and left parietal lobes. There also tiny chronic cortical infarcts in the right frontal lobe and right temporoparietal junction. Small chronic infarcts are also again seen in the cerebellum bilaterally. There are dilated perivascular spaces in the basal ganglia bilaterally including a 2 cm perivascular space on the left. A cavum septum pellucidum et vergae is incidentally noted. Patchy T2 hyperintensities in the cerebral white matter bilaterally have progressed from the prior MRI and are nonspecific but compatible with moderate chronic small vessel ischemic disease. Moderate cerebral atrophy has also mildly progressed from the prior MRI. Vascular: Major intracranial vascular flow voids are preserved. Skull and upper cervical spine: Unremarkable bone marrow signal. Sinuses/Orbits: Bilateral cataract extraction. Paranasal sinuses and mastoid air cells are clear. Other: None. IMPRESSION: 1. Small acute infarcts in the right corona radiata, subcortical right parietal lobe, and left cerebellum. 2. Moderate chronic small vessel  ischemic disease with chronic infarcts as above. Electronically Signed   By: Logan Bores M.D.   On: 11/04/2018 11:57   Dg Chest Port 1 View  Result Date: 11/01/2018 CLINICAL DATA:  63 y/o  F; loss of consciousness. EXAM: PORTABLE CHEST 1 VIEW COMPARISON:  08/17/2018 chest radiograph FINDINGS: Given positioning the lung apices are obscured by the patient's face and neck. Stable cardiac silhouette given projection and technique. Right port catheter tip projects over cavoatrial junction. Blunted costal  diaphragmatic angles, likely small effusions. Pulmonary reticular opacities. No pneumothorax. No acute osseous abnormality is evident. IMPRESSION: Blunted costal diaphragmatic angles, likely small effusions. Pulmonary reticular opacities may represent interstitial edema. Electronically Signed   By: Kristine Garbe M.D.   On: 11/01/2018 22:47   Ct Head Code Stroke Wo Contrast`  Result Date: 11/04/2018 CLINICAL DATA:  Code stroke.  Left-sided weakness. EXAM: CT HEAD WITHOUT CONTRAST TECHNIQUE: Contiguous axial images were obtained from the base of the skull through the vertex without intravenous contrast. COMPARISON:  11/01/2018 FINDINGS: Brain: There is no evidence of acute infarct, intracranial hemorrhage, mass, midline shift, or extra-axial fluid collection. Chronic infarcts are again seen in the left frontal lobe, left parietal lobe, and bilateral cerebellum. A prominent dilated perivascular space in the left lentiform nucleus is unchanged. Patchy hypodensities in the cerebral white matter bilaterally are unchanged and nonspecific but compatible with moderate chronic small vessel ischemic disease. There is mild-to-moderate cerebral atrophy. A cavum septum pellucidum et vergae is again noted Vascular: Calcified atherosclerosis at the skull base. No hyperdense vessel. Skull: No fracture or focal osseous lesion. Sinuses/Orbits: Left sphenoid sinus mucosal thickening. Clear mastoid air cells. Bilateral  cataract extraction. Other: None ASPECTS (Spreckels Stroke Program Early CT Score) - Ganglionic level infarction (caudate, lentiform nuclei, internal capsule, insula, M1-M3 cortex): 7 - Supraganglionic infarction (M4-M6 cortex): 3 Total score (0-10 with 10 being normal): 10 IMPRESSION: 1. No evidence of acute intracranial abnormality. 2. ASPECTS is 10. 3. Moderate chronic small vessel ischemic disease with chronic infarcts as above. These results were communicated to Dr. Lorraine Lax at 9:28 am on 11/04/2018 by text page via the [facility] messaging system. Electronically Signed   By: Logan Bores M.D.   On: 11/04/2018 09:28    Assessment and plan- Patient is a 63 y.o. female with history of high-grade serous carcinoma status post surgery, CAD, CHF, GERD, hyperlipidemia, hypertension, mitral valve disease, sleep apnea previous stroke who is currently admitted due to altered mental status, sepsis, and also developed acute stroke during her hospitalization.  #Acute multifocal stroke, questionable embolic in etiology. 2D echo showed no cardiac source.  Not able to be started on antiplatelet due to worsening of thrombocytopenia.  #Acute thrombocytopenia Patient had normal platelet count on 10/24/2018 at [facility] with a count of 277. Thrombocytopenia is new.  At presentation on 11/01/2018 she has mild thrombocytopenia with a count of 122. With the history of recent diagnosis of ovarian cancer, status post debulking, history of being on chemotherapy, Thrombocytopenia can be chemotherapy related.  I reviewed patient's medical chart from [facility] via care everywhere and not able to see details of chemotherapy start date.  I called [facility] oncology and request to speak to her GYN oncologist Dr. Clarene Essex.  I was transferred to Dr. Prudy Feeler RN Posey Pronto and left a voice message with my callback number. I will check a CBC with differential, smear review, PT PTT.  Immature platelet fraction. Her 4 T score is 5 with two-point from platelet dropped  50% and nadir above 20, one-point from fall less than 1 day with possible prior heparin exposure, two-point from new thrombosis, I will send HIT antibody.  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  Her Ca1 25 prior to the procedure was 1170, post surgery most recent CA125 was 251 She had CT abdomen pelvis with contrast on 10/16/2018 which showed a small outpouching from the small bowel anastomotic site, small to moderate right-sided pleural effusion, some nodularity along left lateral  costophrenic angle.  Right sided pleural effusion was also noted on CT chest wo contrast on 11/01/2018, with underlying atelectasis/infiltrate.  Check CA125. Plan was discussed with Dr.Sainani.   Dr.Sainani,  thank you for allowing me to participate in the care of this patient.  Total face to face encounter time for this patient visit was 70 min. >50% of the time was  spent in counseling and coordination of care.    Earlie Server, MD, PhD Hematology Oncology [facility] at [facility] Pager- [phone] 11/05/2018
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
## 2018-11-05 NOTE — TOC Progression Note (Signed)
Transition of Care [facility]) - Progression Note    Patient Details  Name: Destiny [name] MRN: [id number] Date of Birth: 03/19/1956  Transition of Care [facility]) CM/SW [place], RN Phone Number: 11/05/2018, 9:55 AM  Clinical Narrative:     Spoke with the patient about doing a bed search for Rehab, and the possibility of her going to rehab at [place], she agreed to do a bedsearch and review once bed offers come in.  Expected Discharge Plan: [place] Barriers to Discharge: Continued Medical Work up  Expected Discharge Plan and Services Expected Discharge Plan: [place]   Discharge Planning Services: CM Consult Post Acute Care Choice: [place] arrangements for the past 2 months: Single Family Home Expected Discharge Date: 11/06/18                 DME Agency: (home health already in place)       Social Determinants of Health (SDOH) Interventions    Readmission Risk Interventions No flowsheet data found.

## 2018-11-05 NOTE — Progress Notes (Signed)
OT Cancellation Note  Patient Details Name: Destiny [name] MRN: [id number] DOB: Mar 18, 1956   Cancelled Treatment:    Reason Eval/Treat Not Completed: Medical issues which prohibited therapy. Thank you for the OT consult. Order received and chart reviewed. Pt noted with potassium of 2.7 and platelet count of 26 which are outside of the recommended parameters for therapy. OT will follow acutely and re-attempt as available and pt medically appropriate for OT tx.   Shara Blazing, M.S., OTR/L Ascom: [phone] 11/05/18, 8:31 AM

## 2018-11-05 NOTE — Progress Notes (Signed)
MD notified of Platelet count of 26.

## 2018-11-05 NOTE — TOC Progression Note (Signed)
Transition of Care [facility]) - Progression Note    Patient Details  Name: TYRAH BROERS MRN: [id number] Date of Birth: [birth year]  Transition of Care [facility]) CM/SW [place], RN Phone Number: 11/05/2018, 3:43 PM  Clinical Narrative:    Damaris Schooner with Zack the assistant Director at [facility], he has approved a 30 day LOG for the patient, I called Dough with the [facility] in [place], he is checking to see if they can take a patient with a 30 day LOG, the patient will have to have a [place] test negative to go to [facility] if they were to accept.   Expected Discharge Plan: [place] Barriers to Discharge: Continued Medical Work up  Expected Discharge Plan and Services Expected Discharge Plan: [place]   Discharge Planning Services: CM Consult Post Acute Care Choice: [place] arrangements for the past 2 months: Single Family Home Expected Discharge Date: 11/06/18                 DME Agency: (home health already in place)       Social Determinants of Health (SDOH) Interventions    Readmission Risk Interventions No flowsheet data found.

## 2018-11-05 NOTE — Progress Notes (Signed)
Subjective: Patient is awake.  Slow to respond and confused.     Objective: Current vital signs: BP 103/71 (BP Location: Right Arm)   Pulse (!) 103   Temp 98 F (36.7 C) (Oral)   Resp 18   Ht 5\' 2"  (1.575 m)   Wt 90.9 kg   SpO2 97%   BMI 36.67 kg/m  Vital signs in last 24 hours: Temp:  [97.4 F (36.3 C)-99.3 F (37.4 C)] 98 F (36.7 C) (04/20 0830) Pulse Rate:  [81-113] 103 (04/20 0830) Resp:  [18-28] 18 (04/20 0830) BP: (88-110)/(52-87) 103/71 (04/20 0830) SpO2:  [95 %-98 %] 97 % (04/20 0830)  Intake/Output from previous day: 04/19 0701 - 04/20 0700 In: 1019.4 [I.V.:357.6; IV Piggyback:661.8] Out: 1500 [Urine:1500] Intake/Output this shift: No intake/output data recorded. Nutritional status:  Diet Order            Diet 2 gram sodium Room service appropriate? Yes with Assist; Fluid consistency: Thin; Fluid restriction: 1200 mL Fluid  Diet effective now              Neurologic Exam: Mental Status: Alert and awake.  Responses are slow and patient asks the same questions frequently.  Has difficulty following commands without extensive reinforcement.  Cranial Nerves: II: Blinks to bilateral confrontation III,IV, VI: ptosis not present, extra-ocular motions intact bilaterally V,VII: left facial droop VIII: hearing normal bilaterally Motor: 5/5 on the right.  Does not lift the left arm off the bed.  Able to lift the left leg a small amount off the bed.  Lab Results: Basic Metabolic Panel: Recent Labs  Lab 11/01/18 2145 11/02/18 0641 11/02/18 1151 11/03/18 0432 11/04/18 0635 11/05/18 0351  NA 131* 136  --  135 135 135  K 2.1* 2.3* 2.8* 2.9* 3.3* 2.7*  CL 94* 103  --  103 105 103  CO2 23 24  --  23 22 22   GLUCOSE 123* 127*  --  114* 103* 106*  BUN 26* 21  --  13 8 8   CREATININE 1.06* 0.96  --  0.69 0.64 0.60  CALCIUM 8.1* 7.8*  --  8.0* 8.4* 8.2*  MG 1.6*  --   --  2.0  --   --     Liver Function Tests: Recent Labs  Lab 11/01/18 2145  AST 24  ALT  27  ALKPHOS 75  BILITOT 1.2  PROT 7.1  ALBUMIN 3.3*   No results for input(s): LIPASE, AMYLASE in the last 168 hours. Recent Labs  Lab 11/02/18 0818  AMMONIA 10    CBC: Recent Labs  Lab 11/01/18 2145 11/02/18 0641 11/03/18 0432 11/04/18 0635 11/05/18 0351  WBC 10.2 7.5 5.5 3.5* 2.7*  NEUTROABS 7.6  --   --   --   --   HGB 8.5* 7.5* 8.1* 7.9* 7.6*  HCT 24.9* 22.7* 24.6* 23.3* 23.2*  MCV 87.1 88.0 88.5 88.3 88.9  PLT 122* 96* 63* 39* 26*    Cardiac Enzymes: Recent Labs  Lab 11/01/18 2145  TROPONINI <0.03    Lipid Panel: Recent Labs  Lab 11/05/18 0351  CHOL 128  TRIG 191*  HDL 22*  CHOLHDL 5.8  VLDL 38  LDLCALC 68    CBG: Recent Labs  Lab 11/01/18 2204  GLUCAP 104*    Microbiology: Results for orders placed or performed during the hospital encounter of 11/01/18  Blood Culture (routine x 2)     Status: None (Preliminary result)   Collection Time: 11/01/18  9:45 PM  Result Value  Ref Range Status   Specimen Description BLOOD BLOOD LEFT FOREARM  Final   Special Requests   Final    BOTTLES DRAWN AEROBIC AND ANAEROBIC Blood Culture adequate volume   Culture   Final    NO GROWTH 4 DAYS Performed at [facility], [address] [place], [place] [address]    Report Status PENDING  Incomplete  Blood Culture (routine x 2)     Status: None (Preliminary result)   Collection Time: 11/01/18  9:45 PM  Result Value Ref Range Status   Specimen Description BLOOD BLOOD RIGHT HAND  Final   Special Requests   Final    BOTTLES DRAWN AEROBIC AND ANAEROBIC Blood Culture adequate volume   Culture   Final    NO GROWTH 4 DAYS Performed at [facility], [address], [place], [place] [address]    Report Status PENDING  Incomplete  Urine culture     Status: None   Collection Time: 11/01/18  9:45 PM  Result Value Ref Range Status   Specimen Description   Final    URINE, RANDOM Performed at [facility], [address]  [place], [place] [address]    Special Requests   Final    NONE Performed at [facility], [address] [place], [place] [address]    Culture   Final    NO GROWTH Performed at [facility], [place] [address], [place], [place] [address]    Report Status 11/03/2018 FINAL  Final  MRSA PCR Screening     Status: None   Collection Time: 11/02/18  3:36 PM  Result Value Ref Range Status   MRSA by PCR NEGATIVE NEGATIVE Final    Comment:        The GeneXpert MRSA Assay (FDA approved for NASAL specimens only), is one component of a comprehensive MRSA colonization surveillance program. It is not intended to diagnose MRSA infection nor to guide or monitor treatment for MRSA infections. Performed at [facility], [address] [place], [place] [address]     Coagulation Studies: No results for input(s): LABPROT, INR in the last 72 hours.  Imaging: Ct Angio Head W Or Wo Contrast  Result Date: 11/04/2018 CLINICAL DATA:  Left-sided weakness. EXAM: CT ANGIOGRAPHY HEAD AND NECK TECHNIQUE: Multidetector CT imaging of the head and neck was performed using the standard protocol during bolus administration of intravenous contrast. Multiplanar CT image reconstructions and MIPs were obtained to evaluate the vascular anatomy. Carotid stenosis measurements (when applicable) are obtained utilizing NASCET criteria, using the distal internal carotid diameter as the denominator. CONTRAST:  72mL OMNIPAQUE IOHEXOL 350 MG/ML SOLN COMPARISON:  Carotid Doppler ultrasound 02/15/2016. Chest CT 11/01/2018. FINDINGS: CTA NECK FINDINGS Aortic arch: Standard 3 vessel aortic arch with widely patent arch vessel origins. Right carotid system: Patent with mild calcified plaque at the carotid bifurcation. No evidence of significant stenosis or dissection. Left carotid system: Patent with minimal calcified plaque at the carotid bifurcation. No evidence of significant stenosis or dissection. Vertebral  arteries: Patent without evidence of stenosis or dissection. Mildly dominant left vertebral artery. Skeleton: Mild cervical spondylosis.  No suspicious osseous lesion. Other neck: No mass or enlarged lymph nodes. Upper chest: Partially visualized moderate-sized right pleural effusion, larger than on the recent chest CT. Associated compressive atelectasis in the right lung. Review of the MIP images confirms the above findings CTA HEAD FINDINGS Anterior circulation: The internal carotid arteries are patent from skull base to carotid termini with  mild nonstenotic plaque bilaterally. ACAs and MCAs are patent with mild branch vessel irregularity but no evidence of proximal branch occlusion or significant proximal stenosis. The right ACA is dominant. No aneurysm is identified. Posterior circulation: The intracranial vertebral arteries are widely patent to the basilar. Patent left PICA, bilateral AICAs, and bilateral SCAs are identified. The basilar artery is widely patent. There is a fetal origin of the left PCA. A small right posterior communicating artery is present. Both PCAs are patent with branch vessel irregularity but no significant proximal stenosis. No aneurysm is identified. Venous sinuses: Patent. Anatomic variants: Fetal left PCA.  Dominant right ACA. Review of the MIP images confirms the above findings IMPRESSION: 1. Mild atherosclerosis in the head and neck without large vessel occlusion, significant stenosis, or aneurysm. 2. Moderate right pleural effusion, enlarged from 11/01/2018. These results were communicated to Dr. Lorraine Lax at 9:28 am on 11/04/2018 by text page via the [facility] messaging system. Electronically Signed   By: Logan Bores M.D.   On: 11/04/2018 09:38   Ct Angio Neck W Or Wo Contrast  Result Date: 11/04/2018 CLINICAL DATA:  Left-sided weakness. EXAM: CT ANGIOGRAPHY HEAD AND NECK TECHNIQUE: Multidetector CT imaging of the head and neck was performed using the standard protocol during bolus  administration of intravenous contrast. Multiplanar CT image reconstructions and MIPs were obtained to evaluate the vascular anatomy. Carotid stenosis measurements (when applicable) are obtained utilizing NASCET criteria, using the distal internal carotid diameter as the denominator. CONTRAST:  32mL OMNIPAQUE IOHEXOL 350 MG/ML SOLN COMPARISON:  Carotid Doppler ultrasound 02/15/2016. Chest CT 11/01/2018. FINDINGS: CTA NECK FINDINGS Aortic arch: Standard 3 vessel aortic arch with widely patent arch vessel origins. Right carotid system: Patent with mild calcified plaque at the carotid bifurcation. No evidence of significant stenosis or dissection. Left carotid system: Patent with minimal calcified plaque at the carotid bifurcation. No evidence of significant stenosis or dissection. Vertebral arteries: Patent without evidence of stenosis or dissection. Mildly dominant left vertebral artery. Skeleton: Mild cervical spondylosis.  No suspicious osseous lesion. Other neck: No mass or enlarged lymph nodes. Upper chest: Partially visualized moderate-sized right pleural effusion, larger than on the recent chest CT. Associated compressive atelectasis in the right lung. Review of the MIP images confirms the above findings CTA HEAD FINDINGS Anterior circulation: The internal carotid arteries are patent from skull base to carotid termini with mild nonstenotic plaque bilaterally. ACAs and MCAs are patent with mild branch vessel irregularity but no evidence of proximal branch occlusion or significant proximal stenosis. The right ACA is dominant. No aneurysm is identified. Posterior circulation: The intracranial vertebral arteries are widely patent to the basilar. Patent left PICA, bilateral AICAs, and bilateral SCAs are identified. The basilar artery is widely patent. There is a fetal origin of the left PCA. A small right posterior communicating artery is present. Both PCAs are patent with branch vessel irregularity but no  significant proximal stenosis. No aneurysm is identified. Venous sinuses: Patent. Anatomic variants: Fetal left PCA.  Dominant right ACA. Review of the MIP images confirms the above findings IMPRESSION: 1. Mild atherosclerosis in the head and neck without large vessel occlusion, significant stenosis, or aneurysm. 2. Moderate right pleural effusion, enlarged from 11/01/2018. These results were communicated to Dr. Lorraine Lax at 9:28 am on 11/04/2018 by text page via the [facility] messaging system. Electronically Signed   By: Logan Bores M.D.   On: 11/04/2018 09:38   Mr Brain Wo Contrast  Result Date: 11/04/2018 CLINICAL DATA:  Left-sided numbness and weakness.  History of ovarian cancer. EXAM: MRI HEAD WITHOUT CONTRAST TECHNIQUE: Multiplanar, multiecho pulse sequences of the brain and surrounding structures were obtained without intravenous contrast. COMPARISON:  Head CT 11/04/2018 and MRI 09/09/2011 FINDINGS: Brain: Small acute subcortical infarcts are present in the posterior aspect of the right corona radiata and posterior right parietal lobe. There is also a 1 cm acute infarct anteriorly in the left cerebellar hemisphere. No intracranial hemorrhage, mass, midline shift, or extra-axial fluid collection is identified. Chronic cortical infarcts are again seen in the left frontal and left parietal lobes. There also tiny chronic cortical infarcts in the right frontal lobe and right temporoparietal junction. Small chronic infarcts are also again seen in the cerebellum bilaterally. There are dilated perivascular spaces in the basal ganglia bilaterally including a 2 cm perivascular space on the left. A cavum septum pellucidum et vergae is incidentally noted. Patchy T2 hyperintensities in the cerebral white matter bilaterally have progressed from the prior MRI and are nonspecific but compatible with moderate chronic small vessel ischemic disease. Moderate cerebral atrophy has also mildly progressed from the prior MRI. Vascular:  Major intracranial vascular flow voids are preserved. Skull and upper cervical spine: Unremarkable bone marrow signal. Sinuses/Orbits: Bilateral cataract extraction. Paranasal sinuses and mastoid air cells are clear. Other: None. IMPRESSION: 1. Small acute infarcts in the right corona radiata, subcortical right parietal lobe, and left cerebellum. 2. Moderate chronic small vessel ischemic disease with chronic infarcts as above. Electronically Signed   By: Logan Bores M.D.   On: 11/04/2018 11:57   Ct Head Code Stroke Wo Contrast`  Result Date: 11/04/2018 CLINICAL DATA:  Code stroke.  Left-sided weakness. EXAM: CT HEAD WITHOUT CONTRAST TECHNIQUE: Contiguous axial images were obtained from the base of the skull through the vertex without intravenous contrast. COMPARISON:  11/01/2018 FINDINGS: Brain: There is no evidence of acute infarct, intracranial hemorrhage, mass, midline shift, or extra-axial fluid collection. Chronic infarcts are again seen in the left frontal lobe, left parietal lobe, and bilateral cerebellum. A prominent dilated perivascular space in the left lentiform nucleus is unchanged. Patchy hypodensities in the cerebral white matter bilaterally are unchanged and nonspecific but compatible with moderate chronic small vessel ischemic disease. There is mild-to-moderate cerebral atrophy. A cavum septum pellucidum et vergae is again noted Vascular: Calcified atherosclerosis at the skull base. No hyperdense vessel. Skull: No fracture or focal osseous lesion. Sinuses/Orbits: Left sphenoid sinus mucosal thickening. Clear mastoid air cells. Bilateral cataract extraction. Other: None ASPECTS ([place] Stroke Program Early CT Score) - Ganglionic level infarction (caudate, lentiform nuclei, internal capsule, insula, M1-M3 cortex): 7 - Supraganglionic infarction (M4-M6 cortex): 3 Total score (0-10 with 10 being normal): 10 IMPRESSION: 1. No evidence of acute intracranial abnormality. 2. ASPECTS is 10. 3. Moderate  chronic small vessel ischemic disease with chronic infarcts as above. These results were communicated to Dr. Lorraine Lax at 9:28 am on 11/04/2018 by text page via the [facility] messaging system. Electronically Signed   By: Logan Bores M.D.   On: 11/04/2018 09:28    Medications:  I have reviewed the patient's current medications. Scheduled: . enoxaparin (LOVENOX) injection  40 mg Subcutaneous Q24H  . famotidine  20 mg Oral BID  . feeding supplement (ENSURE ENLIVE)  237 mL Oral BID BM  . multivitamin-lutein  1 capsule Oral Daily  . oxybutynin  5 mg Oral BID  . PARoxetine  30 mg Oral Daily  . pregabalin  150 mg Oral QHS  . pregabalin  75 mg Oral BID  . rosuvastatin  20 mg Oral QHS  . sodium chloride flush  3 mL Intravenous Q12H  . spironolactone  12.5 mg Oral Daily  . venlafaxine XR  75 mg Oral Q breakfast    Assessment/Plan: 63 year old female with left sided weakness found to have multiple small on MRI of the brain including the left cerebellum right subcortical white matter and right corona radiata.  Likely embolic in etiology.  Patient may be hypercoagulable due to cancer/sepsis.  CTA of head and neck shows no hemodynamically significant stenosis.  Echocardiogram shows no cardiac source of emboli with an EF of 60-65%.  A1c pending, LDL 68.  Patient unable to start antiplatelet/anticopagulation therapy at this time due to thrombocytopenia which continues to worsen.    Recommendations: 1.  Will continue to follow 2.  Agree with oncology involvement     LOS: 3 days   Alexis Goodell, MD Neurology [phone] 11/05/2018  11:15 AM

## 2018-11-05 NOTE — NC FL2 (Signed)
[place] LEVEL OF CARE SCREENING TOOL     IDENTIFICATION  Patient Name: Destiny [name] Birthdate: 1955/07/29 Sex: female Admission Date (Current Location): 11/01/2018  [place] and [place] Number:  Engineering geologist and Address:  [facility], [address], [place], [place] [address]      Provider Number: [id number]  Attending Physician Name and Address:  Henreitta Leber, MD  Relative Name and Phone Number:  Meera Vasco Spouse [phone]    Current Level of Care: Hospital Recommended Level of Care: [place] Prior Approval Number:    Date Approved/Denied: 10/01/18 PASRR Number: [id number]  Discharge Plan: SNF    Current Diagnoses: Patient Active Problem List   Diagnosis Date Noted  . HLD (hyperlipidemia) 11/02/2018  . GERD (gastroesophageal reflux disease) 11/02/2018  . CAD (coronary artery disease) 11/02/2018  . Sleep apnea 11/02/2018  . Hypotension 11/02/2018  . Somnolence 11/02/2018  . Chronic diastolic heart failure ([place]) 11/15/2017  . HTN (hypertension) 11/15/2017  . Hypokalemia 11/05/2017  . Chest pain 02/14/2016  . Small bowel obstruction ([place]) 06/16/2015    Orientation RESPIRATION BLADDER Height & Weight     Self, Place, Situation  Normal Continent Weight: 90.9 kg Height:  5\' 2"  (157.5 cm)  BEHAVIORAL SYMPTOMS/MOOD NEUROLOGICAL BOWEL NUTRITION STATUS      Continent Diet(2g sodium)  AMBULATORY STATUS COMMUNICATION OF NEEDS Skin   Extensive Assist Verbally Normal                       Personal Care Assistance Level of Assistance  Dressing, Bathing Bathing Assistance: Limited assistance   Dressing Assistance: Maximum assistance     Functional Limitations Info  Sight, Hearing, Speech Sight Info: Adequate Hearing Info: Adequate Speech Info: Adequate    SPECIAL CARE FACTORS FREQUENCY  PT (By licensed PT), OT (By licensed OT)     PT Frequency: 5 times per week OT Frequency:  5 times per week            Contractures Contractures Info: Not present    Additional Factors Info  Code Status(full code) Code Status Info: Full             Current Medications (11/05/2018):  This is the current hospital active medication list Current Facility-Administered Medications  Medication Dose Route Frequency Provider Last Rate Last Dose  . acetaminophen (TYLENOL) tablet 650 mg  650 mg Oral Q6H PRN Lance Coon, MD   650 mg at 11/03/18 0003   Or  . acetaminophen (TYLENOL) suppository 650 mg  650 mg Rectal Q6H PRN Lance Coon, MD      . albuterol (PROVENTIL) (2.5 MG/3ML) 0.083% nebulizer solution 2.5 mg  2.5 mg Nebulization Q6H PRN Sainani, Belia Heman, MD      . alteplase (CATHFLO ACTIVASE) injection 2 mg  2 mg Intracatheter Once PRN Henreitta Leber, MD      . anticoagulant sodium citrate solution 5 mL  5 mL Intracatheter PRN Henreitta Leber, MD      . ceFEPIme (MAXIPIME) 2 g in sodium chloride 0.9 % 100 mL IVPB  2 g Intravenous Q8H Salary, Montell D, MD 200 mL/hr at 11/05/18 0316 2 g at 11/05/18 0316  . clonazePAM (KLONOPIN) tablet 0.5 mg  0.5 mg Oral QHS PRN Henreitta Leber, MD      . enoxaparin (LOVENOX) injection 40 mg  40 mg Subcutaneous Q24H Lance Coon, MD   40 mg at 11/04/18 2026  . famotidine (PEPCID)  tablet 20 mg  20 mg Oral BID Henreitta Leber, MD   20 mg at 11/05/18 0905  . feeding supplement (ENSURE ENLIVE) (ENSURE ENLIVE) liquid 237 mL  237 mL Oral BID BM Sainani, Vivek J, MD   237 mL at 11/04/18 1455  . heparin lock flush 100 unit/mL  250 Units Intracatheter PRN Sainani, Belia Heman, MD      . multivitamin-lutein (OCUVITE-LUTEIN) capsule 1 capsule  1 capsule Oral Daily Henreitta Leber, MD      . ondansetron [facility]) tablet 4 mg  4 mg Oral Q6H PRN Lance Coon, MD   4 mg at 11/03/18 0853   Or  . ondansetron (ZOFRAN) injection 4 mg  4 mg Intravenous Q6H PRN Lance Coon, MD      . oxybutynin (DITROPAN) tablet 5 mg  5 mg Oral BID Henreitta Leber, MD   5  mg at 11/05/18 0904  . PARoxetine (PAXIL) tablet 30 mg  30 mg Oral Daily Henreitta Leber, MD   30 mg at 11/04/18 1031  . potassium chloride SA (K-DUR) CR tablet 40 mEq  40 mEq Oral Q4H Harrie Foreman, MD   40 mEq at 11/05/18 0640  . pregabalin (LYRICA) capsule 150 mg  150 mg Oral QHS Henreitta Leber, MD   150 mg at 11/04/18 2026  . pregabalin (LYRICA) capsule 75 mg  75 mg Oral BID Henreitta Leber, MD   75 mg at 11/05/18 0905  . rosuvastatin (CRESTOR) tablet 20 mg  20 mg Oral QHS Henreitta Leber, MD   20 mg at 11/04/18 2025  . sodium chloride flush (NS) 0.9 % injection 10 mL  10 mL Intracatheter PRN Henreitta Leber, MD      . sodium chloride flush (NS) 0.9 % injection 3 mL  3 mL Intravenous Q12H Bettey Costa, MD   3 mL at 11/04/18 2026  . spironolactone (ALDACTONE) tablet 12.5 mg  12.5 mg Oral Daily Henreitta Leber, MD   12.5 mg at 11/04/18 0743  . venlafaxine XR (EFFEXOR-XR) 24 hr capsule 75 mg  75 mg Oral Q breakfast Henreitta Leber, MD   75 mg at 11/05/18 5053     Discharge Medications: Please see discharge summary for a list of discharge medications.  Relevant Imaging Results:  Relevant Lab Results:   Additional Information [id number]  Su Hilt, RN

## 2018-11-05 NOTE — Progress Notes (Signed)
[place] at [place] NAME: Destiny [name]    MR#:  [id number]  DATE OF BIRTH:  [birth year]  SUBJECTIVE:   Pt. Continues to have significant left sided weakness with more on LUE than LLE.  No headache, N/V or any other complaints.  A bit confused.     REVIEW OF SYSTEMS:    Review of Systems  Constitutional: Negative for chills and fever.  HENT: Negative for congestion and tinnitus.   Eyes: Negative for blurred vision and double vision.  Respiratory: Negative for cough, shortness of breath and wheezing.   Cardiovascular: Negative for chest pain, orthopnea and PND.  Gastrointestinal: Positive for nausea. Negative for abdominal pain, diarrhea and vomiting.  Genitourinary: Negative for dysuria and hematuria.  Neurological: Positive for weakness (Left upper ext > LLE.  ). Negative for dizziness, sensory change and focal weakness.  All other systems reviewed and are negative.   Nutrition: Heart Healthy Tolerating Diet: Yes Tolerating PT: Eval noted.   DRUG ALLERGIES:   Allergies  Allergen Reactions   Atorvastatin Hives    No s/sx of anaphylaxis, just intermittent hives.  Does not seem to be a class effect as she has tolerated lovastatin previously No s/sx of anaphylaxis, just intermittent hives.  Does not seem to be a class effect as she has tolerated lovastatin previously   Fluconazole Rash    Reaction to generic but not name brand Reaction to generic but not name brand CAN USE DIFLUCAN.DOES NOT BREAK OUT WITH DIFLUCAN.(allergic only to generic brand)   Sertraline Itching   Sulfa Antibiotics Rash and Other (See Comments)    "hot " sensation   Sulfamethoxazole-Trimethoprim Rash and Other (See Comments)    "felt hot" Other reaction(s): Other (See Comments) "felt hot"   Sulfasalazine Other (See Comments) and Rash    "hot " sensation    VITALS:  Blood pressure 105/76, pulse 93, temperature 98.1 F (36.7 C), temperature  source Oral, resp. rate 18, height 5' 2"  (1.575 m), weight 90.9 kg, SpO2 97 %.  PHYSICAL EXAMINATION:   Physical Exam  GENERAL:  63 y.o.-year-old patient lying in bed in no acute distress.  EYES: Pupils equal, round, reactive to light and accommodation. No scleral icterus. Extraocular muscles intact.  HEENT: Head atraumatic, normocephalic. Oropharynx and nasopharynx clear.  NECK:  Supple, no jugular venous distention. No thyroid enlargement, no tenderness.  LUNGS: Normal breath sounds bilaterally, no wheezing, rales, rhonchi. No use of accessory muscles of respiration.  CARDIOVASCULAR: S1, S2 normal. No murmurs, rubs, or gallops.  ABDOMEN: Soft, nontender, nondistended. Bowel sounds present. No organomegaly or mass.  EXTREMITIES: No cyanosis, clubbing or edema b/l.    NEUROLOGIC: Cranial nerves II through XII are intact.  Left-sided weakness more on the left upper extremity than the lower extremity.  Mild left-sided facial droop.   PSYCHIATRIC: The patient is alert and oriented x 3.  SKIN: No obvious rash, lesion, or ulcer.    LABORATORY PANEL:   CBC Recent Labs  Lab 11/05/18 0351  WBC 2.7*  HGB 7.6*  HCT 23.2*  PLT 26*   ------------------------------------------------------------------------------------------------------------------  Chemistries  Recent Labs  Lab 11/01/18 2145  11/03/18 0432  11/05/18 0351  NA 131*   < > 135   < > 135  K 2.1*   < > 2.9*   < > 2.7*  CL 94*   < > 103   < > 103  CO2 23   < > 23   < >  22  GLUCOSE 123*   < > 114*   < > 106*  BUN 26*   < > 13   < > 8  CREATININE 1.06*   < > 0.69   < > 0.60  CALCIUM 8.1*   < > 8.0*   < > 8.2*  MG 1.6*  --  2.0  --   --   AST 24  --   --   --   --   ALT 27  --   --   --   --   ALKPHOS 75  --   --   --   --   BILITOT 1.2  --   --   --   --    < > = values in this interval not displayed.    ------------------------------------------------------------------------------------------------------------------  Cardiac Enzymes Recent Labs  Lab 11/01/18 2145  TROPONINI <0.03   ------------------------------------------------------------------------------------------------------------------  RADIOLOGY:  Ct Angio Head W Or Wo Contrast  Result Date: 11/04/2018 CLINICAL DATA:  Left-sided weakness. EXAM: CT ANGIOGRAPHY HEAD AND NECK TECHNIQUE: Multidetector CT imaging of the head and neck was performed using the standard protocol during bolus administration of intravenous contrast. Multiplanar CT image reconstructions and MIPs were obtained to evaluate the vascular anatomy. Carotid stenosis measurements (when applicable) are obtained utilizing NASCET criteria, using the distal internal carotid diameter as the denominator. CONTRAST:  40m OMNIPAQUE IOHEXOL 350 MG/ML SOLN COMPARISON:  Carotid Doppler ultrasound 02/15/2016. Chest CT 11/01/2018. FINDINGS: CTA NECK FINDINGS Aortic arch: Standard 3 vessel aortic arch with widely patent arch vessel origins. Right carotid system: Patent with mild calcified plaque at the carotid bifurcation. No evidence of significant stenosis or dissection. Left carotid system: Patent with minimal calcified plaque at the carotid bifurcation. No evidence of significant stenosis or dissection. Vertebral arteries: Patent without evidence of stenosis or dissection. Mildly dominant left vertebral artery. Skeleton: Mild cervical spondylosis.  No suspicious osseous lesion. Other neck: No mass or enlarged lymph nodes. Upper chest: Partially visualized moderate-sized right pleural effusion, larger than on the recent chest CT. Associated compressive atelectasis in the right lung. Review of the MIP images confirms the above findings CTA HEAD FINDINGS Anterior circulation: The internal carotid arteries are patent from skull base to carotid termini with mild nonstenotic plaque  bilaterally. ACAs and MCAs are patent with mild branch vessel irregularity but no evidence of proximal branch occlusion or significant proximal stenosis. The right ACA is dominant. No aneurysm is identified. Posterior circulation: The intracranial vertebral arteries are widely patent to the basilar. Patent left PICA, bilateral AICAs, and bilateral SCAs are identified. The basilar artery is widely patent. There is a fetal origin of the left PCA. A small right posterior communicating artery is present. Both PCAs are patent with branch vessel irregularity but no significant proximal stenosis. No aneurysm is identified. Venous sinuses: Patent. Anatomic variants: Fetal left PCA.  Dominant right ACA. Review of the MIP images confirms the above findings IMPRESSION: 1. Mild atherosclerosis in the head and neck without large vessel occlusion, significant stenosis, or aneurysm. 2. Moderate right pleural effusion, enlarged from 11/01/2018. These results were communicated to Dr. ALorraine Laxat 9:28 am on 11/04/2018 by text page via the [facility]aging system. Electronically Signed   By: ALogan BoresM.D.   On: 11/04/2018 09:38   Ct Angio Neck W Or Wo Contrast  Result Date: 11/04/2018 CLINICAL DATA:  Left-sided weakness. EXAM: CT ANGIOGRAPHY HEAD AND NECK TECHNIQUE: Multidetector CT imaging of the head and neck was performed using the  standard protocol during bolus administration of intravenous contrast. Multiplanar CT image reconstructions and MIPs were obtained to evaluate the vascular anatomy. Carotid stenosis measurements (when applicable) are obtained utilizing NASCET criteria, using the distal internal carotid diameter as the denominator. CONTRAST:  48m OMNIPAQUE IOHEXOL 350 MG/ML SOLN COMPARISON:  Carotid Doppler ultrasound 02/15/2016. Chest CT 11/01/2018. FINDINGS: CTA NECK FINDINGS Aortic arch: Standard 3 vessel aortic arch with widely patent arch vessel origins. Right carotid system: Patent with mild calcified plaque at  the carotid bifurcation. No evidence of significant stenosis or dissection. Left carotid system: Patent with minimal calcified plaque at the carotid bifurcation. No evidence of significant stenosis or dissection. Vertebral arteries: Patent without evidence of stenosis or dissection. Mildly dominant left vertebral artery. Skeleton: Mild cervical spondylosis.  No suspicious osseous lesion. Other neck: No mass or enlarged lymph nodes. Upper chest: Partially visualized moderate-sized right pleural effusion, larger than on the recent chest CT. Associated compressive atelectasis in the right lung. Review of the MIP images confirms the above findings CTA HEAD FINDINGS Anterior circulation: The internal carotid arteries are patent from skull base to carotid termini with mild nonstenotic plaque bilaterally. ACAs and MCAs are patent with mild branch vessel irregularity but no evidence of proximal branch occlusion or significant proximal stenosis. The right ACA is dominant. No aneurysm is identified. Posterior circulation: The intracranial vertebral arteries are widely patent to the basilar. Patent left PICA, bilateral AICAs, and bilateral SCAs are identified. The basilar artery is widely patent. There is a fetal origin of the left PCA. A small right posterior communicating artery is present. Both PCAs are patent with branch vessel irregularity but no significant proximal stenosis. No aneurysm is identified. Venous sinuses: Patent. Anatomic variants: Fetal left PCA.  Dominant right ACA. Review of the MIP images confirms the above findings IMPRESSION: 1. Mild atherosclerosis in the head and neck without large vessel occlusion, significant stenosis, or aneurysm. 2. Moderate right pleural effusion, enlarged from 11/01/2018. These results were communicated to Dr. ALorraine Laxat 9:28 am on 11/04/2018 by text page via the [facility] Cent[facility]aging system. Electronically Signed   By: ALogan BoresM.D.   On: 11/04/2018 09:38   Mr Brain Wo  Contrast  Result Date: 11/04/2018 CLINICAL DATA:  Left-sided numbness and weakness. History of ovarian cancer. EXAM: MRI HEAD WITHOUT CONTRAST TECHNIQUE: Multiplanar, multiecho pulse sequences of the brain and surrounding structures were obtained without intravenous contrast. COMPARISON:  Head CT 11/04/2018 and MRI 09/09/2011 FINDINGS: Brain: Small acute subcortical infarcts are present in the posterior aspect of the right corona radiata and posterior right parietal lobe. There is also a 1 cm acute infarct anteriorly in the left cerebellar hemisphere. No intracranial hemorrhage, mass, midline shift, or extra-axial fluid collection is identified. Chronic cortical infarcts are again seen in the left frontal and left parietal lobes. There also tiny chronic cortical infarcts in the right frontal lobe and right temporoparietal junction. Small chronic infarcts are also again seen in the cerebellum bilaterally. There are dilated perivascular spaces in the basal ganglia bilaterally including a 2 cm perivascular space on the left. A cavum septum pellucidum et vergae is incidentally noted. Patchy T2 hyperintensities in the cerebral white matter bilaterally have progressed from the prior MRI and are nonspecific but compatible with moderate chronic small vessel ischemic disease. Moderate cerebral atrophy has also mildly progressed from the prior MRI. Vascular: Major intracranial vascular flow voids are preserved. Skull and upper cervical spine: Unremarkable bone marrow signal. Sinuses/Orbits: Bilateral cataract extraction. Paranasal sinuses and mastoid air  cells are clear. Other: None. IMPRESSION: 1. Small acute infarcts in the right corona radiata, subcortical right parietal lobe, and left cerebellum. 2. Moderate chronic small vessel ischemic disease with chronic infarcts as above. Electronically Signed   By: Logan Bores M.D.   On: 11/04/2018 11:57   Ct Head Code Stroke Wo Contrast`  Result Date: 11/04/2018 CLINICAL  DATA:  Code stroke.  Left-sided weakness. EXAM: CT HEAD WITHOUT CONTRAST TECHNIQUE: Contiguous axial images were obtained from the base of the skull through the vertex without intravenous contrast. COMPARISON:  11/01/2018 FINDINGS: Brain: There is no evidence of acute infarct, intracranial hemorrhage, mass, midline shift, or extra-axial fluid collection. Chronic infarcts are again seen in the left frontal lobe, left parietal lobe, and bilateral cerebellum. A prominent dilated perivascular space in the left lentiform nucleus is unchanged. Patchy hypodensities in the cerebral white matter bilaterally are unchanged and nonspecific but compatible with moderate chronic small vessel ischemic disease. There is mild-to-moderate cerebral atrophy. A cavum septum pellucidum et vergae is again noted Vascular: Calcified atherosclerosis at the skull base. No hyperdense vessel. Skull: No fracture or focal osseous lesion. Sinuses/Orbits: Left sphenoid sinus mucosal thickening. Clear mastoid air cells. Bilateral cataract extraction. Other: None ASPECTS ([place] Stroke Program Early CT Score) - Ganglionic level infarction (caudate, lentiform nuclei, internal capsule, insula, M1-M3 cortex): 7 - Supraganglionic infarction (M4-M6 cortex): 3 Total score (0-10 with 10 being normal): 10 IMPRESSION: 1. No evidence of acute intracranial abnormality. 2. ASPECTS is 10. 3. Moderate chronic small vessel ischemic disease with chronic infarcts as above. These results were communicated to Dr. Lorraine Lax at 9:28 am on 11/04/2018 by text page via the [facility] messaging system. Electronically Signed   By: Logan Bores M.D.   On: 11/04/2018 09:28     ASSESSMENT AND PLAN:   63 year old female with past medical history of ovarian cancer, hypertension, hyperlipidemia, GERD, history of previous CVA, CHF, coronary disease, presented to the hospital due to weakness/altered mental status and also noted to be hypokalemic with suspected sepsis.  1.   Sepsis-patient met criteria admission given her fever, hypotension and suspected pneumonia on CT chest. -Improving.  Continue cefepime for now.  Cultures remain negative and can likely D/c abx in 1-2 days.   2. Acute CVA- patient developed left-sided facial droop and left upper extremity weakness yesterday morning.  -   Underwent CT of the head which was negative for acute pathology.  Neurology consult obtained.  Patient underwent CTA of the head neck which is negative for large vessel disease. - MRI was positive for right-sided coronary radiata and thalamic small vessel CVA. Given her severe anemia and thrombocytopenia patient currently is not on antiplatelet agents. -Continue PT, speech, echocardiogram done showing no acute evidence of thrombus.  3.  Hypokalemia- cont. To supplement and will repeat in a.m. - check Mg. In a.m.   4.  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 -Patient's platelet count continues to drop further.  No acute bleeding presently.  I will get a hematology oncology evaluation.  No acute need for transfusion.  Continue to follow serial counts.  5.  GERD-continue Pepcid.  6.  History of urinary incontinence-continue oxybutynin.  7.  Anxiety-continue Paxil, Klonopin.  8. Hyperlipidemia - cont. Crestor.    9. Depression - cont. Effexor.   Discussed plan of care with patient's husband over the phone.  Patient apparently has no insurance and therefore cannot be placed to a skilled nursing facility.  She apparently has some charity care  through [facility].  I have notified social work to contact the husband regarding disposition process.  All the records are reviewed and case discussed with Care Management/Social Worker. Management plans discussed with the patient, family and they are in agreement.  CODE STATUS: Full code  DVT Prophylaxis: Lovenox  TOTAL TIME TAKING CARE OF THIS PATIENT: 40 minutes.    POSSIBLE D/C unclear, DEPENDING ON CLINICAL CONDITION and social work discussion with husband.   Henreitta Leber M.D on 11/05/2018 at 1:00 PM  Between 7am to 6pm - Pager - [phone]  After 6pm go to [email or web address] - Proofreader  [employer]  Hospitalists  Office  [phone]  CC: Primary care physician; [name], MD

## 2018-11-05 NOTE — Evaluation (Addendum)
Speech Language Pathology Evaluation Patient Details Name: Destiny [name] MRN: [id number] DOB: [birth year] Today's Date: 11/05/2018 Time: 1030-1130 SLP Time Calculation (min) (ACUTE ONLY): 60 min  Problem List:  Patient Active Problem List   Diagnosis Date Noted  . HLD (hyperlipidemia) 11/02/2018  . GERD (gastroesophageal reflux disease) 11/02/2018  . CAD (coronary artery disease) 11/02/2018  . Sleep apnea 11/02/2018  . Hypotension 11/02/2018  . Somnolence 11/02/2018  . Chronic diastolic heart failure ([place]) 11/15/2017  . HTN (hypertension) 11/15/2017  . Hypokalemia 11/05/2017  . Chest pain 02/14/2016  . Small bowel obstruction ([place]) 06/16/2015   Past Medical History:  Past Medical History:  Diagnosis Date  . CAD (coronary artery disease)   . CHF (congestive heart failure) ([place])   . CVA (cerebral infarction)   . Female bladder prolapse   . GERD (gastroesophageal reflux disease)   . Hyperlipidemia   . Hypertension   . Hypokalemia   . Mitral valve disease   . Ovarian ca ([place])   . Sleep apnea   . Stroke [facility])    x3   Past Surgical History:  Past Surgical History:  Procedure Laterality Date  . CARPAL TUNNEL RELEASE Bilateral   . CATARACT EXTRACTION Right 11/22/2017  . CHOLECYSTECTOMY    . COLON SURGERY    . TENNIS ELBOW RELEASE/NIRSCHEL PROCEDURE Left   . TONSILLECTOMY     HPI:   Pt is a 63 y.o. female w/ h/o CHF, CAD, CVAx3, sleep apnea, GERD, Ovarian Ca.  Patient is very somnolent and is unable to contribute information to her HPI.  Per family's report she was brought to the hospital today due to increased somnolence.  She had been laying around on the sofa most of the day and not really waking up for them.  Initially they thought she just needed some extra rest.  She is a cancer patient, with ovarian cancer.  However, when she did not wake up even after some time resting they decided to bring her to the ED.  Here she is found to be hypotensive and hypokalemic.  She is  arousable, but remains somnolent and does not converse.  Work-up in the ED is initially unrevealing.  She has some persistent hypotension, though it seems like her baseline blood pressure is normally low and normotensive.  Hospitalist were called for admission and further evaluation.  CT of chest revealed: Stable nodular changes within the thyroid consistent with goiter; R pleural effusion w/ underlying atelectasis/infiltrate w/ similar presentation on L side per notes.  MIR revealed: Small acute infarcts in the right corona radiata, subcortical right parietal lobe, and left cerebellum. Also, Moderate chronic small vessel ischemic disease with chronic infarcts and Moderate cerebral atrophy.    Assessment / Plan / Recommendation Clinical Impression  Pt appears to present w/ no Auditory or Expressive language deficits, or Aphasia, during this informal Cognitive-linguistic assessment. Pt was hampered by her Hearing and its challenges being unable to wear her Hearing Aids d/t not having Batteries for them(husband is bringing in batteries for the HA per pt). Pt was able to hear and understand SLP and NSG when each spoke w/ increased volume and slowed rate; recommended reducing competing noises in room such as TV and multiple people talking at one time. Also suggested people sit in front of pt in order for her to see their face and to use gestures when able as well.  Pt engaged in simple-complex language during conversation w/ SLP w/ appropriate command of auditory/expressive language skills w/ no  gross deficits noted. Cognitive awareness appeared [facility] w/ appropriate orientation and awareness of medical issues, timeline of events. There were no motor speech deficits noted during conversation - speech intelligibility was 100% and articulation of speech sounds was grossly WFL during speech/conversation. During OM/Sensory exam, a MILD decrease in Tone/ROM was noted in the Left labial corner of mouth; noted both at rest  and during movements of retraction and speech intermittently. Discussion and education given on general OM/labial/speech exercises to target this decreased Tone/ROM. Pt was able to follow through w/ the exercises and recommendations to address OM and speech articulation needs appropriately. Encouraged pt to follow w/ primary MD and/or caregiver if any further questions or need for Outpatient assessment is needed; contact information given. Handouts given on education/exercises. Pt agreed. ST services will be available for education while pt is admitted.     SLP Assessment  SLP Recommendation/Assessment: Patient does not need any further Speech Lanaguage Pathology Services SLP Visit Diagnosis: Dysarthria and anarthria (R47.1)    Follow Up Recommendations  None    Frequency and Duration (n/a)  (n/a)      SLP Evaluation Cognition  Overall Cognitive Status: Within Functional Limits for tasks assessed Arousal/Alertness: Awake/alert Orientation Level: Oriented X4 Attention: Focused;Sustained Focused Attention: Appears intact Sustained Attention: Appears intact Memory: Appears intact(WFL) Awareness: Appears intact Problem Solving: Appears intact Behaviors: (none) Safety/Judgment: Appears intact(grossly WFL)       Comprehension  Auditory Comprehension Overall Auditory Comprehension: Appears within functional limits for tasks assessed Yes/No Questions: Within Functional Limits Commands: Within Functional Limits Conversation: Complex Other Conversation Comments: HOH and did not have HAs in place d/t not having batteries Interfering Components: Hearing EffectiveTechniques: Stressing words;Increased volume;Repetition;Slowed speech Visual Recognition/Discrimination Discrimination: Not tested Reading Comprehension Reading Status: Within funtional limits(menu)    Expression Expression Primary Mode of Expression: Verbal Verbal Expression Overall Verbal Expression: Appears within  functional limits for tasks assessed Initiation: No impairment Automatic Speech: Name;Social Response;Counting;Day of week Level of Generative/Spontaneous Verbalization: Sentence Repetition: No impairment Naming: No impairment Pragmatics: No impairment Interfering Components: (Hearing) Effective Techniques: (repetition) Non-Verbal Means of Communication: Not applicable Written Expression Dominant Hand: Right Written Expression: Not tested   Oral / Motor  Oral Motor/Sensory Function Overall Oral Motor/Sensory Function: Mild impairment Facial ROM: Reduced left Facial Symmetry: Abnormal symmetry left Facial Strength: Reduced left Facial Sensation: Within Functional Limits Lingual ROM: Within Functional Limits Lingual Symmetry: Within Functional Limits Lingual Strength: Within Functional Limits Lingual Sensation: Within Functional Limits Velum: Within Functional Limits Mandible: Within Functional Limits Motor Speech Overall Motor Speech: Appears within functional limits for tasks assessed Respiration: Within functional limits Phonation: Normal Resonance: Within functional limits Articulation: Within functional limitis Intelligibility: Intelligible Motor Planning: Witnin functional limits Motor Speech Errors: Not applicable Interfering Components: Hearing loss(not wearing HAs) Effective Techniques: (increase volume; slow speech; repeat)   GO                     Orinda Kenner, MS, CCC-SLP , 11/05/2018, 5:25 PM

## 2018-11-06 DIAGNOSIS — I639 Cerebral infarction, unspecified: Secondary | ICD-10-CM

## 2018-11-06 DIAGNOSIS — D696 Thrombocytopenia, unspecified: Secondary | ICD-10-CM

## 2018-11-06 DIAGNOSIS — C763 Malignant neoplasm of pelvis: Secondary | ICD-10-CM

## 2018-11-06 LAB — BASIC METABOLIC PANEL
Anion gap: 8 (ref 5–15)
BUN: 8 mg/dL (ref 8–23)
CO2: 25 mmol/L (ref 22–32)
Calcium: 9.1 mg/dL (ref 8.9–10.3)
Chloride: 105 mmol/L (ref 98–111)
Creatinine, Ser: 0.63 mg/dL (ref 0.44–1.00)
GFR calc Af Amer: >60 mL/min (ref >60)
GFR calc non Af Amer: >60 mL/min (ref >60)
Glucose, Bld: 98 mg/dL (ref 70–99)
Potassium: 3.5 mmol/L (ref 3.5–5.1)
Sodium: 138 mmol/L (ref 135–145)

## 2018-11-06 LAB — CBC WITH DIFFERENTIAL/PLATELET
Abs Immature Granulocytes: 0.04 10*3/uL (ref 0.00–0.07)
Basophils Absolute: 0 10*3/uL (ref 0.0–0.1)
Basophils Relative: 0 %
Eosinophils Absolute: 0.1 10*3/uL (ref 0.0–0.5)
Eosinophils Relative: 2 %
HCT: 22.6 % — ABNORMAL LOW (ref 36.0–46.0)
Hemoglobin: 7.7 g/dL — ABNORMAL LOW (ref 12.0–15.0)
Immature Granulocytes: 2 %
Lymphocytes Relative: 27 %
Lymphs Abs: 0.7 10*3/uL (ref 0.7–4.0)
MCH: 30.1 pg (ref 26.0–34.0)
MCHC: 34.1 g/dL (ref 30.0–36.0)
MCV: 88.3 fL (ref 80.0–100.0)
Monocytes Absolute: 0.4 10*3/uL (ref 0.1–1.0)
Monocytes Relative: 15 %
Neutro Abs: 1.3 10*3/uL — ABNORMAL LOW (ref 1.7–7.7)
Neutrophils Relative %: 54 %
Platelets: 21 10*3/uL — CL (ref 150–400)
RBC: 2.56 MIL/uL — ABNORMAL LOW (ref 3.87–5.11)
RDW: 14.6 % (ref 11.5–15.5)
WBC: 2.4 10*3/uL — ABNORMAL LOW (ref 4.0–10.5)
nRBC: 0 % (ref 0.0–0.2)

## 2018-11-06 LAB — PROTIME-INR
INR: 1.1 (ref 0.8–1.2)
Prothrombin Time: 14.1 seconds (ref 11.4–15.2)

## 2018-11-06 LAB — CBC
HCT: 24.7 % — ABNORMAL LOW (ref 36.0–46.0)
Hemoglobin: 8.2 g/dL — ABNORMAL LOW (ref 12.0–15.0)
MCH: 29.8 pg (ref 26.0–34.0)
MCHC: 33.2 g/dL (ref 30.0–36.0)
MCV: 89.8 fL (ref 80.0–100.0)
Platelets: 22 10*3/uL — CL (ref 150–400)
RBC: 2.75 MIL/uL — ABNORMAL LOW (ref 3.87–5.11)
RDW: 14.6 % (ref 11.5–15.5)
WBC: 2 10*3/uL — ABNORMAL LOW (ref 4.0–10.5)
nRBC: 0 % (ref 0.0–0.2)

## 2018-11-06 LAB — APTT: aPTT: 32 seconds (ref 24–36)

## 2018-11-06 LAB — IMMATURE PLATELET FRACTION: Immature Platelet Fraction: 7 % (ref 1.2–8.6)

## 2018-11-06 LAB — CULTURE, BLOOD (ROUTINE X 2)
Culture: NO GROWTH
Culture: NO GROWTH
Special Requests: ADEQUATE
Special Requests: ADEQUATE

## 2018-11-06 LAB — PATHOLOGIST SMEAR REVIEW

## 2018-11-06 LAB — MAGNESIUM: Magnesium: 1.5 mg/dL — ABNORMAL LOW (ref 1.7–2.4)

## 2018-11-06 MED ORDER — MAGNESIUM SULFATE 2 GM/50ML IV SOLN
2.0000 g | Freq: Once | INTRAVENOUS | Status: AC
  Administered 2018-11-06: 14:00:00 2 g via INTRAVENOUS
  Filled 2018-11-06: qty 50

## 2018-11-06 NOTE — Evaluation (Addendum)
Clinical/Bedside Swallow Evaluation Patient Details  Name: Destiny [name] MRN: [id number] Date of Birth: 09/07/1955  Today's Date: 11/06/2018 Time: SLP Start Time (ACUTE ONLY): 51 SLP Stop Time (ACUTE ONLY): 1230 SLP Time Calculation (min) (ACUTE ONLY): 60 min  Past Medical History:  Past Medical History:  Diagnosis Date  . CAD (coronary artery disease)   . CHF (congestive heart failure) ([place])   . CVA (cerebral infarction)   . Female bladder prolapse   . GERD (gastroesophageal reflux disease)   . Hyperlipidemia   . Hypertension   . Hypokalemia   . Mitral valve disease   . Ovarian ca ([place])   . Sleep apnea   . Stroke [facility])    x3   Past Surgical History:  Past Surgical History:  Procedure Laterality Date  . CARPAL TUNNEL RELEASE Bilateral   . CATARACT EXTRACTION Right 11/22/2017  . CHOLECYSTECTOMY    . COLON SURGERY    . TENNIS ELBOW RELEASE/NIRSCHEL PROCEDURE Left   . TONSILLECTOMY     HPI:    Pt is a 63 y.o. female w/ h/o CHF, CAD, CVAx3, sleep apnea, GERD, Ovarian Ca.  Patient is very somnolent and is unable to contribute information to her HPI.  Per family's report she was brought to the hospital today due to increased somnolence.  She had been laying around on the sofa most of the day and not really waking up for them.  Initially they thought she just needed some extra rest.  She is a cancer patient, with ovarian cancer.  However, when she did not wake up even after some time resting they decided to bring her to the ED.  Here she is found to be hypotensive and hypokalemic.  She is arousable, but remains somnolent and does not converse.  Work-up in the ED is initially unrevealing.  She has some persistent hypotension, though it seems like her baseline blood pressure is normally low and normotensive.  Hospitalist were called for admission and further evaluation.  CT of chest revealed: Stable nodular changes within the thyroid consistent with goiter; R pleural effusion w/  underlying atelectasis/infiltrate w/ similar presentation on L side per notes.  MIR revealed: Small acute infarcts in the right corona radiata, subcortical right parietal lobe, and left cerebellum. Also, Moderate chronic small vessel ischemic disease with chronic infarcts and Moderate cerebral atrophy.   Assessment / Plan / Recommendation Clinical Impression  Pt appears to present w/ adequate oropharyngeal phase swallowing function w/ no overt s/s of aspiration noted during po trials at Lunch meal. Pt does exhibit LUE weakness and is unable to use her LUE for self-feeding. Full Tray setup was given. Pt consumed trials of thin liquids via cup/straw, purees, and soft solids w/ no decline in Respiratory status and no decline in vocal quality during trials. Pt fed self w/ spoon after setup. No gross oral phase deficits noted; pt exhibited adequate bolus management w/ trials of liquids and potato soup w/ appropriate oral clearing b/t trials. Pt attempted a bite of grilled cheese sandwich but after brief mastication, she expectorated the bolus indicating it was "not good". She denied any difficulty w/ mastication of it. OM exam revealed mild L labial decreased tone/ROM; lingual strength/ROM was [facility]. Recommend a more mech soft diet consistency w/ thin liquids for easier prepration and self-feeding d/t LUE weakness. Recommend general aspiration precautions; FULL Tray Setup at meals d/t LUE weakness. Recommend pills in Puree if needed for easier swallowing at this time. Recommend Dietician f/u d/t pt's decreased  interest in meals reported. Per Cognitive-linguistic evaluation(informal at bedside w/ challenges from lacking HAs), pt does best in setting of reduced distractions in order to improve concentration to task.  ST services will be available for any further needs/questions while admitted.  SLP Visit Diagnosis: Dysphagia, unspecified (R13.10)    Aspiration Risk  (reduced following general precautions)    Diet  Recommendation  Regular/Mech Soft diet (cut, cooked foods for easier self-feeding); Thin liquids. General aspiration and reflux precautions.  Medication Administration: Whole meds with puree(for safer swallowing at this time)    Other  Recommendations Recommended Consults: (Dietician f/u) Oral Care Recommendations: Oral care BID;Staff/trained caregiver to provide oral care(LUE weakness) Other Recommendations: (n/a)   Follow up Recommendations None      Frequency and Duration (n/a)  (n/a)       Prognosis Prognosis for Safe Diet Advancement: Good Barriers to Reach Goals: Severity of deficits      Swallow Study   General Date of Onset: 11/01/18 Type of Study: Bedside Swallow Evaluation Previous Swallow Assessment: none Diet Prior to this Study: Regular;Thin liquids Temperature Spikes Noted: No(wbc 2.0) Respiratory Status: Room air History of Recent Intubation: No Behavior/Cognition: Alert;Cooperative;Distractible;Requires cueing((min)) Oral Cavity Assessment: Within Functional Limits Oral Care Completed by SLP: Recent completion by staff Oral Cavity - Dentition: Adequate natural dentition Vision: Functional for self-feeding Self-Feeding Abilities: Able to feed self;Needs assist;Needs set up(d/t LUE weakness) Patient Positioning: Upright in bed(needed positioning) Baseline Vocal Quality: Normal Volitional Cough: Strong Volitional Swallow: Able to elicit    Oral/Motor/Sensory Function Overall Oral Motor/Sensory Function: Mild impairment Facial ROM: Reduced left Facial Symmetry: Abnormal symmetry left Facial Strength: Reduced left Facial Sensation: Within Functional Limits Lingual ROM: Within Functional Limits Lingual Symmetry: Within Functional Limits Lingual Strength: Within Functional Limits Lingual Sensation: Within Functional Limits Velum: Within Functional Limits Mandible: Within Functional Limits   Ice Chips Ice chips: Within functional limits Presentation:  Spoon(fed; 3 trials)   Thin Liquid Thin Liquid: Within functional limits Presentation: Self Fed;Straw(~3 ozs w/ sips of the Boost juice drink as well)    Nectar Thick Nectar Thick Liquid: Not tested   Honey Thick Honey Thick Liquid: Not tested   Puree Puree: Within functional limits Presentation: Spoon;Self Fed(3 boluses of pudding)   Solid     Solid: (semi-solid foods mostly; 1 trial of sandwich) Presentation: Self Fed;Spoon Other Comments: pt expectorated the bite of sandwich stating "It wasn't good".      Orinda Kenner, MS, CCC-SLP [place] 11/06/2018,2:07 PM

## 2018-11-06 NOTE — TOC Progression Note (Signed)
Transition of Care [facility]) - Progression Note    [name] Details  Name: Destiny [name] MRN: [id number] Date of Birth: June 09, 1956  Transition [facility]) CM/SW Contact  Su Hilt, RN Phone Number: 11/06/2018, 8:52 AM  Clinical Narrative:    Marden Noble with the [facility] called and notified me that they will not accept the Destiny [name], I called Tammy with [facility] and requested her to accept the [name] for a 30 day Destiny, she agreed to look at it and requested that I send it to her via the [place], I sent the information over via the Hub   Expected Discharge Plan: [place] Barriers to Discharge: Continued Medical Work up  Expected Discharge Plan and Services Expected Discharge Plan: [place]   Discharge Planning Services: CM Consult Post Acute Care Choice: [place] arrangements for the past 2 months: Single Family Home Expected Discharge Date: 11/06/18                 DME Agency: (home health already in place)       Social Determinants of Health (SDOH) Interventions    Readmission Risk Interventions No flowsheet data found.

## 2018-11-06 NOTE — TOC Progression Note (Signed)
Transition of Care [facility]) - Progression Note    Patient Details  Name: Destiny [name] MRN: [id number] Date of Birth: 03/04/1956  Transition of Care [facility]) CM/SW [place], RN Phone Number: 11/06/2018, 11:53 AM  Clinical Narrative:     [facility] has declined to accept the patient Called [facility] to request a bed She requested that I send via the [place], sent inforamtion via the Hub to   Expected Discharge Plan: [place] Barriers to Discharge: Continued Medical Work up  Expected Discharge Plan and Services Expected Discharge Plan: [place]   Discharge Planning Services: CM Consult Post Acute Care Choice: [place] arrangements for the past 2 months: Single Family Home Expected Discharge Date: 11/06/18                 DME Agency: (home health already in place)       Social Determinants of Health (SDOH) Interventions    Readmission Risk Interventions No flowsheet data found.

## 2018-11-06 NOTE — Progress Notes (Signed)
Physical Therapy Treatment Patient Details Name: Destiny [name] MRN: [id number] DOB: [birth year] Today's Date: 11/06/2018    History of Present Illness Destiny [name] is a 63 y.o. female who was recently discharged with recent very complicated and prolonged stays at [facility].  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 antibiotics for 14 days, she was apparently status post 2 doses of carboplatin and planning for chemotherapy as an outpatient when she arrived to hospital ED on 11/01/18 by EMS after her family reported that she had become unarousable. She was admitted to the hospital for hypotension, hypokalemia, chronic diastolic heart failure. On 11/04/18 pt developed left sided hemiparesis. MRI revealed small infarcts in subcortical white matter as well right cerebellum.  Relevant PMH includes CHF, CAD, CVA x3, female bladder prolapse, GERD, HTN, mitral valve disease, ovarian cancer, cholecystecotmy.    PT Comments    L LE appearing stronger today; clonus noted L ankle and also R ankle (pt reporting L ankle clonus recent from stroke but has had R ankle clonus for about 1 year).  Able to sit up on edge of bed with mod to max assist x1; stand  with hemi-walker with L knee blocked with 2 assist; and then perform lateral scoot bed to recliner (L armrest lowered) with 2 assist.  Pt appearing very motivated to participate in therapy and requesting to sit up in chair end of session (nurse and NT notified).  Will continue to progress pt with strengthening and progressive functional mobility per pt tolerance.  Will monitor pt's progress for potential CIR recommendation.    Follow Up Recommendations  SNF     Equipment Recommendations  Wheelchair (measurements PT);Wheelchair cushion (measurements PT);3in1 (PT)    Recommendations for Other Services OT consult;Speech consult     Precautions / Restrictions Precautions Precautions: Fall;Other (comment) Precaution Comments: hemiparesis L arm and leg; abdominal wound; R chest port Restrictions Weight Bearing Restrictions: No    Mobility  Bed Mobility Overal bed mobility: Needs Assistance Bed Mobility: Supine to Sit     Supine to sit: Mod assist;Max assist     General bed mobility comments: assist for trunk and L UE/L LE supine to sit; minimal assist to scoot to edge of bed  Transfers Overall transfer level: Needs assistance Equipment used: Hemi-walker Transfers: Sit to/from Stand;Lateral/Scoot Transfers Sit to Stand: Mod assist;+2 physical assistance        Lateral/Scoot Transfers: Mod assist;+2 physical assistance General transfer comment: assist to initiate and come to full stand (R UE on hemi-walker) with L knee blocked and L UE supported (able to stand about 1 minute but required vc's and tactile cues for upright positioning) with 2 assist; lateral scoot bed to recliner to R (L recliner armrest lowered) with multiple scoots to  reach chair (level transfer) with 2 assist  Ambulation/Gait             General Gait Details: unable to take any steps with B LE's with 2 assist, hemi-walker use, and blocking of knees   Stairs             Wheelchair Mobility     Modified Rankin (Stroke Patients Only)       Balance Overall balance assessment: Needs assistance Sitting-balance support: Single extremity supported;Feet supported Sitting balance-Leahy Scale: Poor Sitting balance - Comments: pt requiring R UE support on bed for static sitting balance (close SBA for safety)       Standing balance comment: 2 assist with L knee blocked and R UE on hemi-walker (cueing for upright posture required)                            Cognition Arousal/Alertness: Awake/alert Behavior During Therapy: WFL for tasks assessed/performed Overall Cognitive Status: Within Functional Limits for tasks assessed                                 General Comments: Inconsistent with multi-step commands      Exercises      General Comments   Nursing cleared pt for participation in physical therapy.  Pt agreeable to PT session.      Pertinent Vitals/Pain Pain Assessment: 0-10 Pain Score: 0-No pain Pain Intervention(s): Limited activity within patient's tolerance;Monitored during session;Repositioned  HR 106 bpm at rest and increased briefly to 131 bpm with activity (returned to 106-110 bpm at rest).    Home Living                      Prior Function            PT Goals (current goals can now be found in the care plan section) Acute Rehab PT Goals Patient Stated Goal: use my L side again PT Goal Formulation: With patient Time For Goal Achievement: 11/18/18 Potential to Achieve Goals: Fair Progress towards PT goals: Progressing toward goals    Frequency    7X/week      PT Plan Current plan remains appropriate    Co-evaluation              AM-PAC PT "6 Clicks" Mobility   Outcome Measure  Help needed turning from your back to your side while in a flat bed without using bedrails?: A Lot Help needed moving from lying on your back to sitting on the side of a flat bed without using bedrails?: A Lot Help needed  moving to and from a bed to a chair (including a wheelchair)?: Total Help needed standing up from a chair using your arms (e.g., wheelchair or bedside chair)?: Total Help needed to walk in hospital room?: Total Help needed climbing 3-5 steps with a railing? : Total 6 Click Score: 8    End of Session Equipment Utilized During Treatment: Gait belt(between abdominal wounds and R chest port) Activity Tolerance: Patient tolerated treatment well Patient left: in chair;with call bell/phone within reach;with chair alarm set;with SCD's reapplied Nurse Communication: Mobility status;Need for lift equipment;Precautions;Other (comment)(hoyer pad left in room for hoyer lift back to bed (or 2 person lateral scoot)) PT Visit Diagnosis: Unsteadiness on feet (R26.81);Muscle weakness (generalized) (M62.81);Other abnormalities of gait and mobility (R26.89);Hemiplegia and hemiparesis Hemiplegia - Right/Left: Left Hemiplegia -  caused by: Cerebral infarction     Time: 6435-3912 PT Time Calculation (min) (ACUTE ONLY): 41 min  Charges:  $Therapeutic Activity: 38-52 mins                     Leitha Bleak, PT 11/06/18, 4:37 PM [phone]

## 2018-11-06 NOTE — Progress Notes (Addendum)
Hematology/Oncology Progress Note [facility] [phone] [phone]  Patient Care Team: [name], MD as PCP - General (Family Medicine)   Name of the patient: Destiny [name]  [id number]  05-17-1956  Date of visit: 11/06/18   INTERVAL HISTORY-  Patient continues to have significant left upper extremity weakness.  She is able to move her left lower extremity.  Denies any bleeding events. Appetite is good.  No swallowing difficulty.  She appears more alert today and able to answer questions.    Review of systems- Review of Systems  Constitutional: Positive for fatigue. Negative for appetite change, chills and fever.  HENT:   Negative for hearing loss and voice change.   Eyes: Negative for eye problems.  Respiratory: Negative for chest tightness and cough.   Cardiovascular: Negative for chest pain.  Gastrointestinal: Positive for constipation. Negative for abdominal distention, abdominal pain and blood in stool.  Endocrine: Negative for hot flashes.  Genitourinary: Negative for difficulty urinating and frequency.   Musculoskeletal: Negative for arthralgias.  Skin: Negative for itching and rash.  Neurological: Positive for extremity weakness.  Hematological: Negative for adenopathy.  Psychiatric/Behavioral: Negative for confusion.    Allergies  Allergen Reactions   Atorvastatin Hives    No s/sx of anaphylaxis, just intermittent hives.  Does not seem to be a class effect as she has tolerated lovastatin previously No s/sx of anaphylaxis, just intermittent hives.  Does not seem to be a class effect as she has tolerated lovastatin previously   Fluconazole Rash    Reaction to generic but not name brand Reaction to generic but not name brand CAN USE DIFLUCAN.DOES NOT BREAK OUT WITH DIFLUCAN.(allergic only to generic brand)   Sertraline Itching   Sulfa Antibiotics Rash and Other (See Comments)    "hot " sensation    Sulfamethoxazole-Trimethoprim Rash and Other (See Comments)    "felt hot" Other reaction(s): Other (See Comments) "felt hot"   Sulfasalazine Other (See Comments) and Rash    "hot " sensation    Patient Active Problem List   Diagnosis Date Noted   HLD (hyperlipidemia) 11/02/2018   GERD (gastroesophageal reflux disease) 11/02/2018   CAD (coronary artery disease) 11/02/2018   Sleep apnea 11/02/2018   Hypotension 11/02/2018   Somnolence 11/02/2018   Chronic diastolic heart failure ([place]) 11/15/2017   HTN (hypertension) 11/15/2017   Hypokalemia 11/05/2017   Chest pain 02/14/2016   Small bowel obstruction ([place]) 06/16/2015     Past Medical History:  Diagnosis Date   CAD (coronary artery disease)    CHF (congestive heart failure) (HCC)    CVA (cerebral infarction)    Female bladder prolapse    GERD (gastroesophageal reflux disease)    Hyperlipidemia    Hypertension    Hypokalemia    Mitral valve disease    Ovarian ca ([place])    Sleep apnea    Stroke ([place])    x3     Past Surgical History:  Procedure Laterality Date   CARPAL TUNNEL RELEASE Bilateral    CATARACT EXTRACTION Right 11/22/2017   CHOLECYSTECTOMY     COLON SURGERY     TENNIS ELBOW RELEASE/NIRSCHEL PROCEDURE Left    TONSILLECTOMY      Social History   Socioeconomic History   Marital status: Married    Spouse name: Not on file   Number of children: Not on file   Years of education: Not on file   Highest education level: Not on file  Occupational History   Not  on file  Social Needs   Financial resource strain: Not on file   Food insecurity:    Worry: Not on file    Inability: Not on file   Transportation needs:    Medical: Not on file    Non-medical: Not on file  Tobacco Use   Smoking status: Never Smoker   Smokeless tobacco: Never Used  Substance and Sexual Activity   Alcohol use: No    Alcohol/week: 0.0 standard drinks   Drug use: No   Sexual activity:  Not Currently  Lifestyle   Physical activity:    Days per week: Not on file    Minutes per session: Not on file   Stress: Not on file  Relationships   Social connections:    Talks on phone: Not on file    Gets together: Not on file    Attends religious service: Not on file    Active member of club or organization: Not on file    Attends meetings of clubs or organizations: Not on file    Relationship status: Not on file   Intimate partner violence:    Fear of current or ex partner: Not on file    Emotionally abused: Not on file    Physically abused: Not on file    Forced sexual activity: Not on file  Other Topics Concern   Not on file  Social History Narrative   Not on file     Family History  Problem Relation Age of Onset   Heart disease Other    Hypertension Other    Rheum arthritis Mother    CAD Father      Current Facility-Administered Medications:    0.9 %  sodium chloride infusion, , Intravenous, PRN, Henreitta Leber, MD, Last Rate: 10 mL/hr at 11/06/18 1343, 5 mL at 11/06/18 1343   acetaminophen (TYLENOL) tablet 650 mg, 650 mg, Oral, Q6H PRN, 650 mg at 11/06/18 1217 **OR** acetaminophen (TYLENOL) suppository 650 mg, 650 mg, Rectal, Q6H PRN, Lance Coon, MD   albuterol (VENTOLIN HFA) 108 (90 Base) MCG/ACT inhaler 2 puff, 2 puff, Inhalation, Q6H PRN, Shanlever, Pierce Crane, RPH   alteplase (CATHFLO ACTIVASE) injection 2 mg, 2 mg, Intracatheter, Once PRN, Verdell Carmine, Belia Heman, MD   anticoagulant sodium citrate solution 5 mL, 5 mL, Intracatheter, PRN, Sainani, Belia Heman, MD   ceFEPIme (MAXIPIME) 2 g in sodium chloride 0.9 % 100 mL IVPB, 2 g, Intravenous, Q8H, Salary, Montell D, MD, Stopped at 11/06/18 0845   clonazePAM (KLONOPIN) tablet 0.5 mg, 0.5 mg, Oral, QHS PRN, Verdell Carmine, Belia Heman, MD   famotidine (PEPCID) tablet 20 mg, 20 mg, Oral, BID, Sainani, Vivek J, MD, 20 mg at 11/06/18 0809   feeding supplement (ENSURE ENLIVE) (ENSURE ENLIVE) liquid 237 mL, 237  mL, Oral, BID BM, Sainani, Vivek J, MD, 237 mL at 11/04/18 1455   heparin lock flush 100 unit/mL, 250 Units, Intracatheter, PRN, Sainani, Vivek J, MD   magnesium sulfate IVPB 2 g 50 mL, 2 g, Intravenous, Once, Henreitta Leber, MD, Last Rate: 50 mL/hr at 11/06/18 1344, 2 g at 11/06/18 1344   multivitamin-lutein (OCUVITE-LUTEIN) capsule 1 capsule, 1 capsule, Oral, Daily, Sainani, Vivek J, MD, 1 capsule at 11/06/18 0809   ondansetron (ZOFRAN) tablet 4 mg, 4 mg, Oral, Q6H PRN, 4 mg at 11/03/18 0853 **OR** ondansetron (ZOFRAN) injection 4 mg, 4 mg, Intravenous, Q6H PRN, Lance Coon, MD   oxybutynin (DITROPAN) tablet 5 mg, 5 mg, Oral, BID, Sainani, Belia Heman, MD, 5  mg at 11/06/18 0809   PARoxetine (PAXIL) tablet 30 mg, 30 mg, Oral, Daily, Verdell Carmine, Belia Heman, MD, 30 mg at 11/06/18 0809   pregabalin (LYRICA) capsule 150 mg, 150 mg, Oral, QHS, Sainani, Belia Heman, MD, 150 mg at 11/05/18 2137   pregabalin (LYRICA) capsule 75 mg, 75 mg, Oral, BID, Henreitta Leber, MD, 75 mg at 11/06/18 1203   rosuvastatin (CRESTOR) tablet 20 mg, 20 mg, Oral, QHS, Sainani, Belia Heman, MD, 20 mg at 11/05/18 2137   sodium chloride flush (NS) 0.9 % injection 3 mL, 3 mL, Intravenous, Q12H, Mody, Sital, MD, 3 mL at 11/06/18 0815   spironolactone (ALDACTONE) tablet 12.5 mg, 12.5 mg, Oral, Daily, Henreitta Leber, MD, Stopped at 11/06/18 0933   venlafaxine XR (EFFEXOR-XR) 24 hr capsule 75 mg, 75 mg, Oral, Q breakfast, Henreitta Leber, MD, 75 mg at 11/06/18 0809   Physical exam:  Vitals:   11/06/18 0112 11/06/18 0505 11/06/18 0750 11/06/18 1125  BP: 104/74 105/74 105/63 100/74  Pulse: 82 99 92 92  Resp: 20  17 16   Temp: 98.1 F (36.7 C) 98.4 F (36.9 C) 98.7 F (37.1 C) 98.3 F (36.8 C)  TempSrc: Oral Oral Oral Oral  SpO2: 96% 97% 97% 96%  Weight:      Height:       Physical Exam  Constitutional: No distress.  HENT:  Head: Normocephalic.  Neck: Normal range of motion.  Cardiovascular: Normal rate.    Pulmonary/Chest: Effort normal.  Abdominal: Soft. She exhibits no distension.  Neurological: She is alert and awake.  Chronic hearing impairment. Right upper extremity and lower extremity 5 out of 5 Left upper extremity 0 out of 5, left lower extremity 0-1 out of 5.  Skin: Skin is warm and dry.  CMP Latest Ref Rng & Units 11/06/2018  Glucose 70 - 99 mg/dL 98  BUN 8 - 23 mg/dL 8  Creatinine 0.44 - 1.00 mg/dL 0.63  Sodium 135 - 145 mmol/L 138  Potassium 3.5 - 5.1 mmol/L 3.5  Chloride 98 - 111 mmol/L 105  CO2 22 - 32 mmol/L 25  Calcium 8.9 - 10.3 mg/dL 9.1  Total Protein 6.5 - 8.1 g/dL -  Total Bilirubin 0.3 - 1.2 mg/dL -  Alkaline Phos 38 - 126 U/L -  AST 15 - 41 U/L -  ALT 0 - 44 U/L -   CBC Latest Ref Rng & Units 11/06/2018  WBC 4.0 - 10.5 K/uL 2.0(L)  Hemoglobin 12.0 - 15.0 g/dL 8.2(L)  Hematocrit 36.0 - 46.0 % 24.7(L)  Platelets 150 - 400 K/uL 22(LL)   RADIOGRAPHIC STUDIES: I have personally reviewed the radiological images as listed and agreed with the findings in the report. Ct Abdomen Pelvis Wo Contrast  Result Date: 11/01/2018 CLINICAL DATA:  Possible cardiac arrest/hypotension, history of ovarian carcinoma EXAM: CT ABDOMEN AND PELVIS WITHOUT CONTRAST TECHNIQUE: Multidetector CT imaging of the abdomen and pelvis was performed following the standard protocol without IV contrast. COMPARISON:  10/16/2018 FINDINGS: Lower chest: Mild right basilar atelectasis is noted. Stable right-sided pleural effusion is seen. Hepatobiliary: No focal liver abnormality is seen. Status post cholecystectomy. No biliary dilatation. Pancreas: Unremarkable. No pancreatic ductal dilatation or surrounding inflammatory changes. Spleen: Normal in size without focal abnormality. Adrenals/Urinary Tract: Adrenal glands are within normal limits. Kidneys are well visualized without obstructive change. Scattered small left renal calculi are noted better appreciated on the noncontrast study. The bladder is  decompressed by Foley catheter. Stomach/Bowel: Colon is predominately decompressed. No obstructive changes  are seen. The appendix is not well visualized. No inflammatory changes to suggest appendicitis are seen. No small bowel obstructive changes are noted. Stomach is decompressed. Vascular/Lymphatic: Aortic atherosclerosis. No enlarged abdominal or pelvic lymph nodes. Reproductive: Uterus is stable in appearance. The ovaries are not visualized consistent with the recent surgical history. Other: Some mild peritoneal thickening is noted consistent with the patient's known history of omental disease. No free fluid is seen. Postsurgical changes are noted anteriorly. The degree of fluid along the surgical wound has decreased. Musculoskeletal: Degenerative changes of the lumbar spine are noted. No lytic or sclerotic lesions are seen. IMPRESSION: Stable right pleural effusion and right basilar atelectasis. Postoperative changes. 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 No acute abnormality is noted. Electronically Signed   By: Inez Catalina M.D.   On: 11/01/2018 23:42   Ct Angio Head W Or Wo Contrast  Result Date: 11/04/2018 CLINICAL DATA:  Left-sided weakness. EXAM: CT ANGIOGRAPHY HEAD AND NECK TECHNIQUE: Multidetector CT imaging of the head and neck was performed using the standard protocol during bolus administration of intravenous contrast. Multiplanar CT image reconstructions and MIPs were obtained to evaluate the vascular anatomy. Carotid stenosis measurements (when applicable) are obtained utilizing NASCET criteria, using the distal internal carotid diameter as the denominator. CONTRAST:  36mL OMNIPAQUE IOHEXOL 350 MG/ML SOLN COMPARISON:  Carotid Doppler ultrasound 02/15/2016. Chest CT 11/01/2018. FINDINGS: CTA NECK FINDINGS Aortic arch: Standard 3 vessel aortic arch with widely patent arch vessel origins. Right carotid system: Patent with mild calcified plaque at the  carotid bifurcation. No evidence of significant stenosis or dissection. Left carotid system: Patent with minimal calcified plaque at the carotid bifurcation. No evidence of significant stenosis or dissection. Vertebral arteries: Patent without evidence of stenosis or dissection. Mildly dominant left vertebral artery. Skeleton: Mild cervical spondylosis.  No suspicious osseous lesion. Other neck: No mass or enlarged lymph nodes. Upper chest: Partially visualized moderate-sized right pleural effusion, larger than on the recent chest CT. Associated compressive atelectasis in the right lung. Review of the MIP images confirms the above findings CTA HEAD FINDINGS Anterior circulation: The internal carotid arteries are patent from skull base to carotid termini with mild nonstenotic plaque bilaterally. ACAs and MCAs are patent with mild branch vessel irregularity but no evidence of proximal branch occlusion or significant proximal stenosis. The right ACA is dominant. No aneurysm is identified. Posterior circulation: The intracranial vertebral arteries are widely patent to the basilar. Patent left PICA, bilateral AICAs, and bilateral SCAs are identified. The basilar artery is widely patent. There is a fetal origin of the left PCA. A small right posterior communicating artery is present. Both PCAs are patent with branch vessel irregularity but no significant proximal stenosis. No aneurysm is identified. Venous sinuses: Patent. Anatomic variants: Fetal left PCA.  Dominant right ACA. Review of the MIP images confirms the above findings IMPRESSION: 1. Mild atherosclerosis in the head and neck without large vessel occlusion, significant stenosis, or aneurysm. 2. Moderate right pleural effusion, enlarged from 11/01/2018. These results were communicated to Dr. Lorraine Lax at 9:28 am on 11/04/2018 by text page via the [facility] messaging system. Electronically Signed   By: Logan Bores M.D.   On: 11/04/2018 09:38   Ct Head Wo  Contrast  Result Date: 11/02/2018 CLINICAL DATA:  Increased somnolence EXAM: CT HEAD WITHOUT CONTRAST TECHNIQUE: Contiguous axial images were obtained from the base of the skull through the vertex without intravenous contrast. COMPARISON:  11/13/2017 FINDINGS: Brain: Mild atrophic changes are  noted. Stable chronic white matter ischemic changes are seen. Areas of prior frontal and parietal infarcts on the left are noted and stable. Dilated perivascular space in the left lentiform nucleus is again seen and stable. No findings to suggest acute hemorrhage, acute infarction or space-occupying mass lesion are noted. Cavum septum pellucidum is again seen. Vascular: No hyperdense vessel or unexpected calcification. Skull: Normal. Negative for fracture or focal lesion. Sinuses/Orbits: No acute finding. Other: None. IMPRESSION: Chronic changes without acute abnormality. Electronically Signed   By: Inez Catalina M.D.   On: 11/02/2018 00:25   Ct Angio Neck W Or Wo Contrast  Result Date: 11/04/2018 CLINICAL DATA:  Left-sided weakness. EXAM: CT ANGIOGRAPHY HEAD AND NECK TECHNIQUE: Multidetector CT imaging of the head and neck was performed using the standard protocol during bolus administration of intravenous contrast. Multiplanar CT image reconstructions and MIPs were obtained to evaluate the vascular anatomy. Carotid stenosis measurements (when applicable) are obtained utilizing NASCET criteria, using the distal internal carotid diameter as the denominator. CONTRAST:  47mL OMNIPAQUE IOHEXOL 350 MG/ML SOLN COMPARISON:  Carotid Doppler ultrasound 02/15/2016. Chest CT 11/01/2018. FINDINGS: CTA NECK FINDINGS Aortic arch: Standard 3 vessel aortic arch with widely patent arch vessel origins. Right carotid system: Patent with mild calcified plaque at the carotid bifurcation. No evidence of significant stenosis or dissection. Left carotid system: Patent with minimal calcified plaque at the carotid bifurcation. No evidence of  significant stenosis or dissection. Vertebral arteries: Patent without evidence of stenosis or dissection. Mildly dominant left vertebral artery. Skeleton: Mild cervical spondylosis.  No suspicious osseous lesion. Other neck: No mass or enlarged lymph nodes. Upper chest: Partially visualized moderate-sized right pleural effusion, larger than on the recent chest CT. Associated compressive atelectasis in the right lung. Review of the MIP images confirms the above findings CTA HEAD FINDINGS Anterior circulation: The internal carotid arteries are patent from skull base to carotid termini with mild nonstenotic plaque bilaterally. ACAs and MCAs are patent with mild branch vessel irregularity but no evidence of proximal branch occlusion or significant proximal stenosis. The right ACA is dominant. No aneurysm is identified. Posterior circulation: The intracranial vertebral arteries are widely patent to the basilar. Patent left PICA, bilateral AICAs, and bilateral SCAs are identified. The basilar artery is widely patent. There is a fetal origin of the left PCA. A small right posterior communicating artery is present. Both PCAs are patent with branch vessel irregularity but no significant proximal stenosis. No aneurysm is identified. Venous sinuses: Patent. Anatomic variants: Fetal left PCA.  Dominant right ACA. Review of the MIP images confirms the above findings IMPRESSION: 1. Mild atherosclerosis in the head and neck without large vessel occlusion, significant stenosis, or aneurysm. 2. Moderate right pleural effusion, enlarged from 11/01/2018. These results were communicated to Dr. Lorraine Lax at 9:28 am on 11/04/2018 by text page via the [facility] messaging system. Electronically Signed   By: Logan Bores M.D.   On: 11/04/2018 09:38   Ct Chest Wo Contrast  Result Date: 11/02/2018 CLINICAL DATA:  History of ovarian carcinoma and hypotension EXAM: CT CHEST WITHOUT CONTRAST TECHNIQUE: Multidetector CT imaging of the chest was  performed following the standard protocol without IV contrast. COMPARISON:  Chest x-ray from earlier in the same day, CT from 11/13/2017 FINDINGS: Cardiovascular: Thoracic aorta shows atherosclerotic calcifications without aneurysmal dilatation. No cardiac enlargement is noted. Coronary calcifications are seen. The pulmonary artery is within normal limits. A few small air bubbles are noted within the main pulmonary artery elated to the IV start.  Mediastinum/Nodes: Nodular changes in the thyroid are again noted stable from the prior exam consistent with underlying goiter. No significant hilar or mediastinal adenopathy is noted. The esophagus as visualized is within normal limits. Lungs/Pleura: Mild left basilar atelectatic changes are noted. Right-sided pleural effusion is noted with some patchy basilar infiltrate/atelectasis. No significant nodularity is noted. Upper Abdomen: Within normal limits as previously seen on CT of the abdomen. Musculoskeletal: Degenerative changes of the thoracic spine are noted. No acute bony abnormality is noted. IMPRESSION: Stable nodular changes within the thyroid consistent with goiter. Right-sided pleural effusion with underlying atelectasis/infiltrate. Mild left basilar atelectasis is noted as well. Aortic Atherosclerosis (ICD10-I70.0). Electronically Signed   By: Inez Catalina M.D.   On: 11/02/2018 00:28   Mr Brain Wo Contrast  Result Date: 11/04/2018 CLINICAL DATA:  Left-sided numbness and weakness. History of ovarian cancer. EXAM: MRI HEAD WITHOUT CONTRAST TECHNIQUE: Multiplanar, multiecho pulse sequences of the brain and surrounding structures were obtained without intravenous contrast. COMPARISON:  Head CT 11/04/2018 and MRI 09/09/2011 FINDINGS: Brain: Small acute subcortical infarcts are present in the posterior aspect of the right corona radiata and posterior right parietal lobe. There is also a 1 cm acute infarct anteriorly in the left cerebellar hemisphere. No  intracranial hemorrhage, mass, midline shift, or extra-axial fluid collection is identified. Chronic cortical infarcts are again seen in the left frontal and left parietal lobes. There also tiny chronic cortical infarcts in the right frontal lobe and right temporoparietal junction. Small chronic infarcts are also again seen in the cerebellum bilaterally. There are dilated perivascular spaces in the basal ganglia bilaterally including a 2 cm perivascular space on the left. A cavum septum pellucidum et vergae is incidentally noted. Patchy T2 hyperintensities in the cerebral white matter bilaterally have progressed from the prior MRI and are nonspecific but compatible with moderate chronic small vessel ischemic disease. Moderate cerebral atrophy has also mildly progressed from the prior MRI. Vascular: Major intracranial vascular flow voids are preserved. Skull and upper cervical spine: Unremarkable bone marrow signal. Sinuses/Orbits: Bilateral cataract extraction. Paranasal sinuses and mastoid air cells are clear. Other: None. IMPRESSION: 1. Small acute infarcts in the right corona radiata, subcortical right parietal lobe, and left cerebellum. 2. Moderate chronic small vessel ischemic disease with chronic infarcts as above. Electronically Signed   By: Logan Bores M.D.   On: 11/04/2018 11:57   Dg Chest Port 1 View  Result Date: 11/01/2018 CLINICAL DATA:  63 y/o  F; loss of consciousness. EXAM: PORTABLE CHEST 1 VIEW COMPARISON:  08/17/2018 chest radiograph FINDINGS: Given positioning the lung apices are obscured by the patient's face and neck. Stable cardiac silhouette given projection and technique. Right port catheter tip projects over cavoatrial junction. Blunted costal diaphragmatic angles, likely small effusions. Pulmonary reticular opacities. No pneumothorax. No acute osseous abnormality is evident. IMPRESSION: Blunted costal diaphragmatic angles, likely small effusions. Pulmonary reticular opacities may  represent interstitial edema. Electronically Signed   By: Kristine Garbe M.D.   On: 11/01/2018 22:47   Ct Head Code Stroke Wo Contrast`  Result Date: 11/04/2018 CLINICAL DATA:  Code stroke.  Left-sided weakness. EXAM: CT HEAD WITHOUT CONTRAST TECHNIQUE: Contiguous axial images were obtained from the base of the skull through the vertex without intravenous contrast. COMPARISON:  11/01/2018 FINDINGS: Brain: There is no evidence of acute infarct, intracranial hemorrhage, mass, midline shift, or extra-axial fluid collection. Chronic infarcts are again seen in the left frontal lobe, left parietal lobe, and bilateral cerebellum. A prominent dilated perivascular space in the  left lentiform nucleus is unchanged. Patchy hypodensities in the cerebral white matter bilaterally are unchanged and nonspecific but compatible with moderate chronic small vessel ischemic disease. There is mild-to-moderate cerebral atrophy. A cavum septum pellucidum et vergae is again noted Vascular: Calcified atherosclerosis at the skull base. No hyperdense vessel. Skull: No fracture or focal osseous lesion. Sinuses/Orbits: Left sphenoid sinus mucosal thickening. Clear mastoid air cells. Bilateral cataract extraction. Other: None ASPECTS ([place] Stroke Program Early CT Score) - Ganglionic level infarction (caudate, lentiform nuclei, internal capsule, insula, M1-M3 cortex): 7 - Supraganglionic infarction (M4-M6 cortex): 3 Total score (0-10 with 10 being normal): 10 IMPRESSION: 1. No evidence of acute intracranial abnormality. 2. ASPECTS is 10. 3. Moderate chronic small vessel ischemic disease with chronic infarcts as above. These results were communicated to Dr. Lorraine Lax at 9:28 am on 11/04/2018 by text page via the [facility] messaging system. Electronically Signed   By: Logan Bores M.D.   On: 11/04/2018 09:28    Assessment and plan-  Patient is a 63 y.o. female history of high-grade serous carcinoma status post surgery, CAD, CHF, GERD,  hyperlipidemia, hypertension, mitral valve disease, sleep apnea previous stroke who is currently admitted due to altered mental status, sepsis, and also developed acute stroke during her hospitalization.  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 I communicated with patient's GYN oncologist Dr. Clarene Essex today.  Patient is status post 2 cycles of adjuvant chemotherapy single agent carboplatin. Most recent carboplatin AUC of 5 was given on 10/23/2018. Patient's thrombocytopenia most likely is secondary to recent chemotherapy.  Today repeat CBC showed a platelet counts of 22,000.   I did send thrombocytopenia work-up yesterday as I did not not have any details about her chemotherapy at that point.. Coags normal less likely DIC, immature platelet fraction eating appropriately normal in the context of severe thrombocytopenia, consistent with bone marrow suppression.  HIT antibody was sent and result is pending.   Patient has significant residual disease after debulking surgery, and it appears that patient did not tolerate chemotherapy well.  CA 125 is pending.  If her condition continues to decline, would involve palliative care.   #Acute CVA, currently antiplatelet therapy was held due to severe thrombocytopenia, anticipate platelet count to recover in the next few days.  Once her platelet count recovers to be more than 50,000, patient can be started on antiplatelet therapy.  Discussed with Dr. Clarene Essex and he is in agreement.  # Leukopenia, mainly due to Neutropenia, chemotherapy induced. Continue to monitor.  # Anemia, hemoglobin stable. Continue to monitor.   I called patient's husband and updated him that I have communicated with Dr.Soper and also updated him about above recommendations. Discussed with Dr.Sainani via secure chat.   Thank you for allowing me to participate in the care of this patient.  We spent sufficient time to discuss many  aspect of care, questions were answered to patient's satisfaction. Total face to face encounter time for this patient visit was 35 min. >50% of the time was  spent in counseling and coordination of care.   Earlie Server, MD, PhD Hematology Oncology [facility] at [facility] Pager- [phone] 11/06/2018

## 2018-11-06 NOTE — Progress Notes (Signed)
[place] [facility] [place] NAME: Destiny [name]    MR#:  [id number]  DATE OF BIRTH:  1956-06-10  SUBJECTIVE:   Still has significant left upper extremity weakness.  Left lower extremity weakness is improving.  Remains quite thrombocytopenic still.  No other acute events overnight.   REVIEW OF SYSTEMS:    Review of Systems  Constitutional: Negative for chills and fever.  HENT: Negative for congestion and tinnitus.   Eyes: Negative for blurred vision and double vision.  Respiratory: Negative for cough, shortness of breath and wheezing.   Cardiovascular: Negative for chest pain, orthopnea and PND.  Gastrointestinal: Positive for nausea. Negative for abdominal pain, diarrhea and vomiting.  Genitourinary: Negative for dysuria and hematuria.  Neurological: Positive for weakness (Left upper ext > LLE.  ). Negative for dizziness, sensory change and focal weakness.  All other systems reviewed and are negative.   Nutrition: Heart Healthy Tolerating Diet: Yes Tolerating PT: Eval noted.   DRUG ALLERGIES:   Allergies  Allergen Reactions  . Atorvastatin Hives    No s/sx of anaphylaxis, just intermittent hives.  Does not seem to be a class effect as she has tolerated lovastatin previously No s/sx of anaphylaxis, just intermittent hives.  Does not seem to be a class effect as she has tolerated lovastatin previously  . Fluconazole Rash    Reaction to generic but not name brand Reaction to generic but not name brand CAN USE DIFLUCAN.DOES NOT BREAK OUT WITH DIFLUCAN.(allergic only to generic brand)  . Sertraline Itching  . Sulfa Antibiotics Rash and Other (See Comments)    "hot " sensation  . Sulfamethoxazole-Trimethoprim Rash and Other (See Comments)    "felt hot" Other reaction(s): Other (See Comments) "felt hot"  . Sulfasalazine Other (See Comments) and Rash    "hot " sensation    VITALS:  Blood pressure 100/74, pulse 92, temperature 98.3  F (36.8 C), temperature source Oral, resp. rate 16, height 5' 2"  (1.575 m), weight 90.9 kg, SpO2 96 %.  PHYSICAL EXAMINATION:   Physical Exam  GENERAL:  63 y.o.-year-old patient lying in bed in no acute distress.  EYES: Pupils equal, round, reactive to light and accommodation. No scleral icterus. Extraocular muscles intact.  HEENT: Head atraumatic, normocephalic. Oropharynx and nasopharynx clear.  NECK:  Supple, no jugular venous distention. No thyroid enlargement, no tenderness.  LUNGS: Normal breath sounds bilaterally, no wheezing, rales, rhonchi. No use of accessory muscles of respiration.  CARDIOVASCULAR: S1, S2 normal. No murmurs, rubs, or gallops.  ABDOMEN: Soft, nontender, nondistended. Bowel sounds present. No organomegaly or mass.  EXTREMITIES: No cyanosis, clubbing or edema b/l.    NEUROLOGIC: Cranial nerves II through XII are intact.  Left-sided weakness more on the left upper extremity than the lower extremity.  Mild left-sided facial droop.   PSYCHIATRIC: The patient is alert and oriented x 3.  SKIN: No obvious rash, lesion, or ulcer.    LABORATORY PANEL:   CBC Recent Labs  Lab 11/06/18 0559  WBC 2.0*  HGB 8.2*  HCT 24.7*  PLT 22*   ------------------------------------------------------------------------------------------------------------------  Chemistries  Recent Labs  Lab 11/01/18 2145  11/06/18 0559  NA 131*   < > 138  K 2.1*   < > 3.5  CL 94*   < > 105  CO2 23   < > 25  GLUCOSE 123*   < > 98  BUN 26*   < > 8  CREATININE 1.06*   < >  0.63  CALCIUM 8.1*   < > 9.1  MG 1.6*   < > 1.5*  AST 24  --   --   ALT 27  --   --   ALKPHOS 75  --   --   BILITOT 1.2  --   --    < > = values in this interval not displayed.   ------------------------------------------------------------------------------------------------------------------  Cardiac Enzymes Recent Labs  Lab 11/01/18 2145  TROPONINI <0.03    ------------------------------------------------------------------------------------------------------------------  RADIOLOGY:  No results found.   ASSESSMENT AND PLAN:   63 year old female with past medical history of ovarian cancer, hypertension, hyperlipidemia, GERD, history of previous CVA, CHF, coronary disease, presented to the hospital due to weakness/altered mental status and also noted to be hypokalemic with suspected sepsis.  1.  Sepsis-patient met criteria on admission given her fever, hypotension and suspected pneumonia on CT chest. -Improving.  Continue cefepime for now.  Cultures cont. To remain negative and can likely D/c abx tomorrow.  2. Acute CVA- patient developed left-sided facial droop and left upper extremity weakness on 11/04/18.   -   Underwent CT of the head which was negative for acute pathology.  Neurology consult obtained.  Patient underwent CTA of the head neck which is negative for large vessel disease. - MRI was positive for right-sided coronary radiata and thalamic small vessel CVA. Given her severe anemia and thrombocytopenia patient currently is not on antiplatelet agents. -Continue PT, speech, echocardiogram done showing no acute evidence of thrombus. - PT recommending SNF/STR.    3.  Hypokalemia/Hypomagnesemia-  Improved with supplementation.   - will replace Mg with IV mg sulfate.   4.  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 Appreciate oncology input.  PT PTT are normal and no evidence of DIC presently.  As per oncology did not think this is likely chemo related. -No acute bleeding.  Continue to follow serial counts.  Avoid antiplatelet agents for now.  5.  GERD-continue Pepcid.  6.  History of urinary incontinence-continue oxybutynin.  7.  Anxiety-continue Paxil, Klonopin.  8. Hyperlipidemia - cont. Crestor.    9. Depression - cont. Effexor.   Oncology has updated patient's  husband about the plan of care over the phone. Social work working on placement as patient has no Insurance underwriter and has charity care through [employer].  All the records are reviewed and case discussed with Care Management/Social Worker. Management plans discussed with the patient, family and they are in agreement.  CODE STATUS: Full code  DVT Prophylaxis: Lovenox  TOTAL TIME TAKING CARE OF THIS PATIENT: 30 minutes.   POSSIBLE D/C unclear, DEPENDING ON CLINICAL CONDITION and social work dispo input.  Henreitta Leber M.D on 11/06/2018 at 1:11 PM  Between 7am to 6pm - Pager - [phone]  After 6pm go to [email or web address] - Proofreader  [facility]  Office  [phone]  CC: Primary care physician; [name], MD

## 2018-11-06 NOTE — Evaluation (Signed)
Occupational Therapy Evaluation Patient Details Name: Destiny [name] MRN: [id number] DOB: [birth year] Today's Date: 11/06/2018    History of Present Illness Destiny [name] is a 63 y.o. female who was recently discharged with recent very complicated and prolonged stays at [facility].  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 antibiotics for 14 days, she was apparently status post 2 doses of carboplatin and planning for chemotherapy as an outpatient when she arrived to hospital ED on 11/01/18 by EMS after her family reported that she had become unarousable. She was admitted to the hospital for hypotension, hypokalemia, chronic diastolic heart failure. On 11/04/18 pt developed left sided hemiparesis. MRI revealed small infarcts in subcortical white matter as well right cerebellum.  Relevant PMH includes CHF, CAD, CVA x3, female bladder prolapse, GERD, HTN, mitral valve disease, ovarian cancer, cholecystecotmy.   Clinical Impression   Pt seen for OT evaluation this date. Prior to hospital admission, pt was mostly independent in all aspects of ADL except received assist from spouse for socks and shoes as well as IADL tasks. Pt was ambulating with  modified independence using a rollator. Pt and spouse live in a level entry apartment with DME (see below). Currently pt demonstrates significant impairments in LUE/LLE strength, coordination, and sensation. Multi-beat sustained clonus noted in L wrist and L ankle. Pt is R hand dominant and requires set up to [place] for self feeding and grooming tasks, mod/max assist for seated UB ADL, and Max A +2 for lateral lean LB ADL. Pt required +2 assist for bed mobility. LUE positioned on lap with hand over hand assist and manual cues for quads activation to support participation in lateral scoots EOB, still requiring max +2 to perform. Pt initially required Min A for sitting balance, decreasing to CGA and no LOB noted. Pt is eager to return to his PLOF with increased independence and functional use of her L side. Pt educated on safe positioning of LUE in order to promote functional return, improve safety, and promote skin integrity on this date. Pt was motivated t/o OT session. No cognitive or visual deficits noted with assessment on this date. Pt would benefit from skilled OT to address noted impairments and functional limitations (see below for any additional details) in order to maximize safety and independence while minimizing falls risk and caregiver burden.  Upon hospital discharge, recommend pt discharge to STR to maximize safety and return to PLOF.    Follow Up Recommendations  SNF    Equipment Recommendations  (TBD)    Recommendations for Other Services       Precautions / Restrictions Precautions Precautions: Fall;Other (comment) Precaution Comments: hemiparesis L arm and leg.  Restrictions Weight Bearing Restrictions: No      Mobility Bed Mobility Overal  bed mobility: Needs Assistance Bed Mobility: Supine to Sit;Sit to Supine     Supine to sit: Min assist;+2 for physical assistance;HOB elevated Sit to supine: Min assist;+2 for physical assistance      Transfers Overall transfer  level: Needs assistance   Transfers: Lateral/Scoot Transfers          Lateral/Scoot Transfers: Max assist;+2 physical assistance General transfer comment: HR up to low 120's with effort, came back down to 108 quickly    Balance Overall balance assessment: Needs assistance Sitting-balance support: Single extremity supported;Feet supported Sitting balance-Leahy Scale: Fair Sitting balance - Comments: initial assist for trunk control, decreasing to CGA with no LOB noted       Standing balance comment: Unable to attempt at this time.                            ADL either performed or assessed with clinical judgement   ADL Overall ADL's : Needs assistance/impaired Eating/Feeding: Set up;Bed level   Grooming: Bed level;Minimal assistance Grooming Details (indicate cue type and reason): using R hand Upper Body Bathing: Sitting;Moderate assistance   Lower Body Bathing: Sitting/lateral leans;Maximal assistance;+2 for physical assistance   Upper Body Dressing : Sitting;Maximal assistance   Lower Body Dressing: Sitting/lateral leans;Maximal assistance;+2 for physical assistance     Toilet Transfer Details (indicate cue type and reason): unsafe to attempt                  Vision Patient Visual Report: No change from baseline       Perception     Praxis      Pertinent Vitals/Pain Pain Assessment: No/denies pain(initially 5/10 L hip pain in bed, after bed mobility, pt reports no pain)     Hand Dominance Right   Extremity/Trunk Assessment Upper Extremity Assessment Upper Extremity Assessment: RUE deficits/detail;LUE deficits/detail RUE Deficits / Details: Generalized weakness, shoulder elevation > on R than L LUE Deficits / Details: pt able to involuntarily move LUE briefly, unable to replicate, sustained multi beat clonus at the wrist noted, is able to activate shoulder elevation, impaired sensation throughout LUE LUE Sensation: decreased light  touch;decreased proprioception LUE Coordination: decreased gross motor;decreased fine motor   Lower Extremity Assessment Lower Extremity Assessment: RLE deficits/detail;LLE deficits/detail RLE Deficits / Details: generalized weakness LLE Deficits / Details: trace activation of toe movement. Knee extension 2/5; ankle plantarflexion 1/5. No discernable activation in dorsiflexion. Hip adductors/abductors 1/5.  LLE Sensation: decreased light touch;decreased proprioception LLE Coordination: decreased fine motor;decreased gross motor   Cervical / Trunk Assessment Cervical / Trunk Assessment: Other exceptions;Kyphotic Cervical / Trunk Exceptions: healing abdominal incision. stooped posture, unable to control trunk with lean to right and back in sitting, possible curvature of the spine visualized   Communication Communication Communication: Expressive difficulties   Cognition Arousal/Alertness: Awake/alert Behavior During Therapy: WFL for tasks assessed/performed Overall Cognitive Status: Within Functional Limits for tasks assessed                                 General Comments: alert and oriented, follows commands   General Comments  sustained multi-beat clonus noted in L ankle and L wrist    Exercises Other Exercises Other Exercises: pt educated in joint/skin protection for LUE given decreased sensation and motor control   Shoulder Instructions      Home Living Family/patient expects to be discharged to:: Private residence Living Arrangements:  Spouse/significant other Available Help at Discharge: Family;Available 24 hours/day Type of Home: Apartment Home Access: Level entry     Home Layout: One level     Bathroom Shower/Tub: Tub/shower unit         Home Equipment: Walker - 4 wheels;Bedside commode;Shower seat;Grab bars - tub/shower(2 canes of unknown type; bedside table)      Lives With: Spouse    Prior Functioning/Environment Level of Independence:  Needs assistance  Gait / Transfers Assistance Needed: patient reports she ambulated mod I with rollator walker ADL's / Homemaking Assistance Needed: report she was I with all ADLs except donning shoes and socks; needed help with IADLs.             OT Problem List: Decreased strength;Decreased range of motion;Decreased coordination;Cardiopulmonary status limiting activity;Impaired UE functional use;Pain;Impaired sensation;Impaired balance (sitting and/or standing)      OT Treatment/Interventions: Self-care/ADL training;Balance training;Therapeutic exercise;Therapeutic activities;Neuromuscular education;DME and/or AE instruction;Patient/family education;Manual therapy    OT Goals(Current goals can be found in the care plan section) Acute Rehab OT Goals Patient Stated Goal: use my L side again OT Goal Formulation: With patient Time For Goal Achievement: 11/20/18 Potential to Achieve Goals: Good ADL Goals Pt Will Perform Upper Body Dressing: sitting;with min assist(using learned hemi techniques) Pt Will Transfer to Toilet: with max assist;bedside commode;stand pivot transfer(LRAD for transfer) Additional ADL Goal #1: Pt will utilize learned joint protection strategies for LUE to minimize risk of injury 2/2 decreased sensation and motor control.  OT Frequency: Min 2X/week   Barriers to D/C:            Co-evaluation              AM-PAC OT "6 Clicks" Daily Activity     Outcome Measure Help from another person eating meals?: A Little Help from another person taking care of personal grooming?: A Little Help from another person toileting, which includes using toliet, bedpan, or urinal?: Total Help from another person bathing (including washing, rinsing, drying)?: Total Help from another person to put on and taking off regular upper body clothing?: A Lot Help from another person to put on and taking off regular lower body clothing?: Total 6 Click Score: 11   End of Session     Activity Tolerance: Patient tolerated treatment well Patient left: in bed;with call bell/phone within reach;with bed alarm set;with SCD's reapplied  OT Visit Diagnosis: Other abnormalities of gait and mobility (R26.89);Hemiplegia and hemiparesis;Pain Hemiplegia - Right/Left: Left Hemiplegia - dominant/non-dominant: Non-Dominant Hemiplegia - caused by: Cerebral infarction Pain - Right/Left: Left Pain - part of body: Hip                Time: 9381-8299 OT Time Calculation (min): 20 min Charges:  OT General Charges $OT Visit: 1 Visit OT Evaluation $OT Eval Moderate Complexity: 1 Mod OT Treatments $Neuromuscular Re-education: 8-22 mins  Jeni Salles, MPH, MS, OTR/L ascom [phone] 11/06/18, 11:19 AM

## 2018-11-07 LAB — BASIC METABOLIC PANEL
Anion gap: 9 (ref 5–15)
BUN: 10 mg/dL (ref 8–23)
CO2: 23 mmol/L (ref 22–32)
Calcium: 9.1 mg/dL (ref 8.9–10.3)
Chloride: 104 mmol/L (ref 98–111)
Creatinine, Ser: 0.68 mg/dL (ref 0.44–1.00)
GFR calc Af Amer: >60 mL/min (ref >60)
GFR calc non Af Amer: >60 mL/min (ref >60)
Glucose, Bld: 99 mg/dL (ref 70–99)
Potassium: 3.1 mmol/L — ABNORMAL LOW (ref 3.5–5.1)
Sodium: 136 mmol/L (ref 135–145)

## 2018-11-07 LAB — CBC
HCT: 23.6 % — ABNORMAL LOW (ref 36.0–46.0)
Hemoglobin: 7.9 g/dL — ABNORMAL LOW (ref 12.0–15.0)
MCH: 29.6 pg (ref 26.0–34.0)
MCHC: 33.5 g/dL (ref 30.0–36.0)
MCV: 88.4 fL (ref 80.0–100.0)
Platelets: 26 10*3/uL — CL (ref 150–400)
RBC: 2.67 MIL/uL — ABNORMAL LOW (ref 3.87–5.11)
RDW: 14.5 % (ref 11.5–15.5)
WBC: 2.4 10*3/uL — ABNORMAL LOW (ref 4.0–10.5)
nRBC: 0 % (ref 0.0–0.2)

## 2018-11-07 LAB — MAGNESIUM: Magnesium: 2 mg/dL (ref 1.7–2.4)

## 2018-11-07 LAB — CA 125: Cancer Antigen (CA) 125: 130 U/mL — ABNORMAL HIGH (ref 0.0–38.1)

## 2018-11-07 LAB — HEPARIN INDUCED PLATELET AB (HIT ANTIBODY): Heparin Induced Plt Ab: 0.081 OD (ref 0.000–0.400)

## 2018-11-07 MED ORDER — DOCUSATE SODIUM 100 MG PO CAPS
100.0000 mg | ORAL_CAPSULE | Freq: Every day | ORAL | Status: DC
  Administered 2018-11-07 – 2018-11-09 (×3): 100 mg via ORAL
  Filled 2018-11-07 (×3): qty 1

## 2018-11-07 MED ORDER — POTASSIUM CHLORIDE CRYS ER 20 MEQ PO TBCR
40.0000 meq | EXTENDED_RELEASE_TABLET | Freq: Two times a day (BID) | ORAL | Status: DC
  Administered 2018-11-07 – 2018-11-09 (×5): 40 meq via ORAL
  Filled 2018-11-07 (×5): qty 2

## 2018-11-07 NOTE — Plan of Care (Signed)
  Problem: Activity: Goal: Ability to tolerate increased activity will improve Outcome: Progressing   Problem: Education: Goal: Knowledge of secondary prevention will improve Outcome: Progressing Goal: Knowledge of patient specific risk factors addressed and post discharge goals established will improve Outcome: Progressing   Problem: Coping: Goal: Will identify appropriate support needs Outcome: Progressing   Problem: Health Behavior/Discharge Planning: Goal: Ability to manage health-related needs will improve Outcome: Progressing   Problem: Self-Care: Goal: Ability to participate in self-care as condition permits will improve Outcome: Progressing Goal: Ability to communicate needs accurately will improve Outcome: Progressing  Chair today  with p.t. Problem: Nutrition: Goal: Risk of aspiration will decrease Outcome: Progressing   Problem: Ischemic Stroke/TIA Tissue Perfusion: Goal: Complications of ischemic stroke/TIA will be minimized Outcome: Progressing   Problem: Education: Goal: Knowledge of General Education information will improve Description Including pain rating scale, medication(s)/side effects and non-pharmacologic comfort measures Outcome: Progressing   Problem: Safety: Goal: Ability to remain free from injury will improve Outcome: Progressing

## 2018-11-07 NOTE — TOC Progression Note (Signed)
Transition of Care [facility]) - Progression Note    Patient Details  Name: Destiny [name] MRN: [id number] Date of Birth: 04/16/1956  Transition of Care [facility] CM/SW Contact  Su Hilt, RN Phone Number: 11/07/2018, 11:24 AM  Clinical Narrative:     Blair Hailey at [facility] and left a VM requesting a call back    Expected Discharge Plan: [place] Barriers to Discharge: Continued Medical Work up  Expected Discharge Plan and Services Expected Discharge Plan: [place]   Discharge Planning Services: CM Consult Post Acute Care Choice: [place] arrangements for the past 2 months: Single Family Home Expected Discharge Date: 11/06/18                 DME Agency: (home health already in place)       Social Determinants of Health (SDOH) Interventions    Readmission Risk Interventions No flowsheet data found.

## 2018-11-07 NOTE — Progress Notes (Signed)
Occupational Therapy Treatment Patient Details Name: Destiny [name] MRN: [id number] DOB: 03/28/1956 Today's Date: 11/07/2018    History of present illness Destiny [name] is a 63 y.o. female who was recently discharged with recent very complicated and prolonged stays at [facility].  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 antibiotics for 14 days, she was apparently status post 2 doses of carboplatin and planning for chemotherapy as an outpatient when she arrived to hospital ED on 11/01/18 by EMS after her family reported that she had become unarousable. She was admitted to the hospital for hypotension, hypokalemia, chronic diastolic heart failure. On 11/04/18 pt developed left sided hemiparesis. MRI revealed small infarcts in subcortical white matter as well right cerebellum.  Relevant PMH includes CHF, CAD, CVA x3, female bladder prolapse, GERD, HTN, mitral valve disease, ovarian cancer, cholecystecotmy.   OT comments  Pt seen for OT/PT co-treatment on this date. Upon arrival to room pt was awake/alert semi-reclined in bed with brekfast tray. Pt A&O and agreeable to tx. OT/PT assisted pt EOB pt req. mod/max assist for trunk and LLE/LUE mgt.  While seated EOB and provided with CGA to min assist to maintain seated balance pt engaged in seated ADLs including teeth brushing, face washing, and application of deodorant. Pt instructed in strategies for incorporating LUE into functional activities. Pt noted to be very motivated to incorporate her LUE despite significant motor/functional deficits. Pt regularly used RUE to position items in her left hand. OT provided physical assist and tactile input to promote improved LUE function during grooming tasks. Pt return demonstrated techniques and was noted to attempt multiple trials of tasks (e.g. application of deodorant) without prompting until she was successful with the task. Pt LLE function notably improved since evaluation, however, pt continues to require mod/max assist +2 for functional transfers/mobility. Pt making good progress toward goals. Pt continues to benefit from skilled OT services to maximize return to PLOF and minimize risk of future falls, injury, caregiver burden, and readmission. POC remains appropriate, however DC recommendation has been updated to CIR in order to maximize functional improvement and return to PLOF. Frequency of OT sessions upgraded to 3x/week.  Follow Up Recommendations  CIR    Equipment Recommendations  (TBD at next venue of care. )    Recommendations for Other Services Rehab consult    Precautions / Restrictions Precautions Precautions: Fall;Other (comment) Precaution Comments: hemiparesis L arm and leg; abdominal wound; R chest port Restrictions Weight Bearing Restrictions: No       Mobility Bed Mobility Overal bed mobility: Needs Assistance Bed Mobility: Supine to Sit     Supine to sit: Mod assist;Max assist Sit to supine: Min assist;+2 for physical assistance   General bed mobility comments: assist for trunk and L UE/L  LE supine to sit; minimal assist to scoot to edge of bed  Transfers Overall transfer level: Needs assistance Equipment used:  Hemi-walker;None Transfers: Sit to/from Stand;Stand Pivot Transfers Sit to Stand: Min assist;Min guard;+2 physical assistance Stand pivot transfers: Mod assist;+2 physical assistance      Lateral/Scoot Transfers: Mod assist;+2 physical assistance General transfer comment: x3 trials standing (min assist x2 first trial with R UE on hemi-walker; CGA to min assist x2 2nd trial with R UE on hemi-walker and 3rd trial with R UE on therapists arm); vc's and assist for B LE placement    Balance Overall balance assessment: Needs assistance Sitting-balance support: Single extremity supported;Feet supported Sitting balance-Leahy Scale: Poor Sitting balance - Comments: pt initially requiring R UE support on bed for static sitting balance (close SBA to CGA for safety); with ADL's/self care activities with OT requiring CGA to min assist to occasionally mod assist for sitting balance. Postural control: Posterior lean   Standing balance-Leahy Scale: Poor Standing balance comment: CGA to min assist x2 with vc's for upright posture required (static standing)                           ADL either performed or assessed with clinical judgement   ADL Overall ADL's : Needs assistance/impaired     Grooming: Set up;Sitting;Wash/dry face;Oral care;Moderate assistance;Maximal assistance Grooming Details (indicate cue type and reason): Pt required physical assistance to maintain seated balance t/o seated grooming task EOB. She actively attempted to use her LUE to stabalize items (e.g. toothpaste tube, toothbrush, etc.) t/o session. When given Max/total physical assistance for mgt of LUE, pt was able to use BUE to complete grooming EOB. Pt noted to engage in multiple attempts at use of LUE for application of deodorant on this date.                             Functional mobility during ADLs: +2 for physical assistance;+2 for safety/equipment;Maximal assistance General ADL Comments: Pt required  +2 max assist for functional stand pivot transfer from EOB to room recliner on this date. Pt attempted use of hemi-walker for functional mobility, and required max VC's for technique during STS transfers.      Vision Patient Visual Report: No change from baseline     Perception     Praxis      Cognition Arousal/Alertness: Awake/alert Behavior During Therapy: WFL for tasks assessed/performed;Flat affect Overall Cognitive Status: Within Functional Limits for tasks assessed                                 General Comments: Inconsistent and increased time with multi-step commands        Exercises Other Exercises Other Exercises: pt educated in safe positioning of LUE for sleep and functional activity as well as provided with reinforcement on strategies for joint/skin protection for LUE given decreased sensation and motor control. Other Exercises: Pt assisted with Grooming ADL tasks including face washing, teeth brushing, and application of deodorant while seated EOB.    Shoulder Instructions       General Comments Pt noted to have flat affect t/o tx session, however was consistently motivated to attempt use of LUE for ADL tasks. Regularly independently intiated attempts to use LUE to stabalize items.     Pertinent Vitals/ Pain       Pain Assessment:  Faces Faces Pain Scale: No hurt Pain Intervention(s): Limited activity within patient's tolerance;Monitored during session;Repositioned  Home Living Family/patient expects to be discharged to:: Private residence                                        Prior Functioning/Environment              Frequency  Min 3X/week        Progress Toward Goals  OT Goals(current goals can now be found in the care plan section)  Progress towards OT goals: Progressing toward goals  Acute Rehab OT Goals Patient Stated Goal: use my L side again OT Goal Formulation: With patient Time For Goal Achievement:  11/20/18 Potential to Achieve Goals: Good  Plan Discharge plan needs to be updated;Frequency needs to be updated    Co-evaluation    PT/OT/SLP Co-Evaluation/Treatment: Yes Reason for Co-Treatment: For patient/therapist safety;To address functional/ADL transfers PT goals addressed during session: Mobility/safety with mobility;Balance;Proper use of DME OT goals addressed during session: ADL's and self-care;Proper use of Adaptive equipment and DME      AM-PAC OT "6 Clicks" Daily Activity     Outcome Measure   Help from another person eating meals?: A Lot Help from another person taking care of personal grooming?: A Lot Help from another person toileting, which includes using toliet, bedpan, or urinal?: A Lot Help from another person bathing (including washing, rinsing, drying)?: A Lot Help from another person to put on and taking off regular upper body clothing?: A Lot Help from another person to put on and taking off regular lower body clothing?: A Lot 6 Click Score: 12    End of Session Equipment Utilized During Treatment: Gait belt;Other (comment)(hemi-walker)  OT Visit Diagnosis: Other abnormalities of gait and mobility (R26.89);Hemiplegia and hemiparesis;Pain Hemiplegia - Right/Left: Left Hemiplegia - dominant/non-dominant: Non-Dominant Hemiplegia - caused by: Cerebral infarction Pain - Right/Left: Left Pain - part of body: Hip   Activity Tolerance Patient tolerated treatment well   Patient Left with SCD's reapplied;in chair;with chair alarm set;with call bell/phone within reach   Nurse Communication          Time: 8527-7824 OT Time Calculation (min): 45 min  Charges: OT General Charges $OT Visit: 1 Visit OT Treatments $Self Care/Home Management : 8-22 mins  Shara Blazing, M.S., OTR/L Ascom: [phone] 11/07/18, 11:16 AM

## 2018-11-07 NOTE — Progress Notes (Signed)
Hematology/Oncology Progress Note [facility] Telephone:([phone] [phone]  Patient Care Team: [name], MD as PCP - General (Family Medicine)   Name of the patient: Destiny [name]  [id number]  1955/12/29  Date of visit: 11/07/18   INTERVAL HISTORY-  Patient continues to have significant left upper extremity weakness, able to move left arm little bit. Left lower extremity strength improved. Denies any bleeding events. Appetite is good. No swallowing difficulty. Denies any cough or shortness of breath. She tells me that she has not had any bowel movement since admission. Denies any pain today.  Review of systems- Review of Systems  Constitutional: Positive for fatigue. Negative for appetite change, chills and fever.  HENT:   Negative for hearing loss and voice change.   Eyes: Negative for eye problems.  Respiratory: Negative for chest tightness and cough.   Cardiovascular: Negative for chest pain.  Gastrointestinal: Positive for constipation. Negative for abdominal distention, abdominal pain and blood in stool.  Endocrine: Negative for hot flashes.  Genitourinary: Negative for difficulty urinating and frequency.   Musculoskeletal: Negative for arthralgias.  Skin: Negative for itching and rash.  Neurological: Positive for extremity weakness.  Hematological: Negative for adenopathy.  Psychiatric/Behavioral: Negative for confusion.    Allergies  Allergen Reactions   Atorvastatin Hives    No s/sx of anaphylaxis, just intermittent hives.  Does not seem to be a class effect as she has tolerated lovastatin previously No s/sx of anaphylaxis, just intermittent hives.  Does not seem to be a class effect as she has tolerated lovastatin previously   Fluconazole Rash    Reaction to generic but not name brand Reaction to generic but not name brand CAN USE DIFLUCAN.DOES NOT BREAK OUT WITH DIFLUCAN.(allergic only to generic brand)    Sertraline Itching   Sulfa Antibiotics Rash and Other (See Comments)    "hot " sensation   Sulfamethoxazole-Trimethoprim Rash and Other (See Comments)    "felt hot" Other reaction(s): Other (See Comments) "felt hot"   Sulfasalazine Other (See Comments) and Rash    "hot " sensation    Patient Active Problem List   Diagnosis Date Noted   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)    HLD (hyperlipidemia) 11/02/2018   GERD (gastroesophageal reflux disease) 11/02/2018   CAD (coronary artery disease) 11/02/2018   Sleep apnea 11/02/2018   Hypotension 11/02/2018   Somnolence 11/02/2018   Chronic diastolic heart failure ([place]) 11/15/2017   HTN (hypertension) 11/15/2017   Hypokalemia 11/05/2017   Chest pain 02/14/2016   Small bowel obstruction ([place]) 06/16/2015     Past Medical History:  Diagnosis Date   CAD (coronary artery disease)    CHF (congestive heart failure) (HCC)    CVA (cerebral infarction)    Female bladder prolapse    GERD (gastroesophageal reflux disease)    Hyperlipidemia    Hypertension    Hypokalemia    Mitral valve disease    Ovarian ca ([place])    Sleep apnea    Stroke [facility])    x3     Past Surgical History:  Procedure Laterality Date   CARPAL TUNNEL RELEASE Bilateral    CATARACT EXTRACTION Right 11/22/2017   CHOLECYSTECTOMY     COLON SURGERY     TENNIS ELBOW RELEASE/NIRSCHEL PROCEDURE Left    TONSILLECTOMY      Social History   Socioeconomic History   Marital status: Married    Spouse name: Not  on file   Number of children: Not on file   Years of education: Not on file   Highest education level: Not on file  Occupational History   Not on file  Social Needs   Financial resource strain: Not on file   Food insecurity:    Worry: Not on file    Inability: Not on file   Transportation needs:    Medical: Not on file    Non-medical: Not on  file  Tobacco Use   Smoking status: Never Smoker   Smokeless tobacco: Never Used  Substance and Sexual Activity   Alcohol use: No    Alcohol/week: 0.0 standard drinks   Drug use: No   Sexual activity: Not Currently  Lifestyle   Physical activity:    Days per week: Not on file    Minutes per session: Not on file   Stress: Not on file  Relationships   Social connections:    Talks on phone: Not on file    Gets together: Not on file    Attends religious service: Not on file    Active member of club or organization: Not on file    Attends meetings of clubs or organizations: Not on file    Relationship status: Not on file   Intimate partner violence:    Fear of current or ex partner: Not on file    Emotionally abused: Not on file    Physically abused: Not on file    Forced sexual activity: Not on file  Other Topics Concern   Not on file  Social History Narrative   Not on file     Family History  Problem Relation Age of Onset   Heart disease Other    Hypertension Other    Rheum arthritis Mother    CAD Father      Current Facility-Administered Medications:    0.9 %  sodium chloride infusion, , Intravenous, PRN, Henreitta Leber, MD, Stopped at 11/07/18 0341   acetaminophen (TYLENOL) tablet 650 mg, 650 mg, Oral, Q6H PRN, 650 mg at 11/06/18 1217 **OR** acetaminophen (TYLENOL) suppository 650 mg, 650 mg, Rectal, Q6H PRN, Lance Coon, MD   albuterol (VENTOLIN HFA) 108 (90 Base) MCG/ACT inhaler 2 puff, 2 puff, Inhalation, Q6H PRN, Shanlever, Pierce Crane, RPH   alteplase (CATHFLO ACTIVASE) injection 2 mg, 2 mg, Intracatheter, Once PRN, Verdell Carmine, Belia Heman, MD   anticoagulant sodium citrate solution 5 mL, 5 mL, Intracatheter, PRN, Sainani, Belia Heman, MD   ceFEPIme (MAXIPIME) 2 g in sodium chloride 0.9 % 100 mL IVPB, 2 g, Intravenous, Q8H, Salary, Montell D, MD, Last Rate: 200 mL/hr at 11/07/18 1100, 2 g at 11/07/18 1100   clonazePAM (KLONOPIN) tablet 0.5 mg, 0.5 mg,  Oral, QHS PRN, Henreitta Leber, MD, 0.5 mg at 11/06/18 2113   famotidine (PEPCID) tablet 20 mg, 20 mg, Oral, BID, Sainani, Vivek J, MD, 20 mg at 11/07/18 1057   feeding supplement (ENSURE ENLIVE) (ENSURE ENLIVE) liquid 237 mL, 237 mL, Oral, BID BM, Sainani, Vivek J, MD, 237 mL at 11/04/18 1455   heparin lock flush 100 unit/mL, 250 Units, Intracatheter, PRN, Sainani, Belia Heman, MD   multivitamin-lutein (OCUVITE-LUTEIN) capsule 1 capsule, 1 capsule, Oral, Daily, Sainani, Vivek J, MD, 1 capsule at 11/07/18 1056   ondansetron (ZOFRAN) tablet 4 mg, 4 mg, Oral, Q6H PRN, 4 mg at 11/03/18 0853 **OR** ondansetron (ZOFRAN) injection 4 mg, 4 mg, Intravenous, Q6H PRN, Lance Coon, MD   oxybutynin (DITROPAN) tablet 5 mg, 5  mg, Oral, BID, Henreitta Leber, MD, 5 mg at 11/07/18 1056   PARoxetine (PAXIL) tablet 30 mg, 30 mg, Oral, Daily, Sainani, Belia Heman, MD, 30 mg at 11/07/18 1057   pregabalin (LYRICA) capsule 150 mg, 150 mg, Oral, QHS, Sainani, Belia Heman, MD, 150 mg at 11/06/18 2112   pregabalin (LYRICA) capsule 75 mg, 75 mg, Oral, BID, Henreitta Leber, MD, 75 mg at 11/07/18 1056   rosuvastatin (CRESTOR) tablet 20 mg, 20 mg, Oral, QHS, Sainani, Belia Heman, MD, 20 mg at 11/06/18 2112   sodium chloride flush (NS) 0.9 % injection 3 mL, 3 mL, Intravenous, Q12H, Mody, Sital, MD, 3 mL at 11/07/18 1057   spironolactone (ALDACTONE) tablet 12.5 mg, 12.5 mg, Oral, Daily, Sainani, Vivek J, MD, 12.5 mg at 11/07/18 1056   venlafaxine XR (EFFEXOR-XR) 24 hr capsule 75 mg, 75 mg, Oral, Q breakfast, Henreitta Leber, MD, 75 mg at 11/07/18 1056   Physical exam:  Vitals:   11/06/18 1449 11/06/18 1933 11/07/18 0015 11/07/18 0356  BP: 99/70 110/75 115/82 114/85  Pulse: 100 93 92 92  Resp: 18 17 18 17   Temp: 98 F (36.7 C) 98.1 F (36.7 C) 98.5 F (36.9 C) 98.4 F (36.9 C)  TempSrc: Oral Oral Oral Oral  SpO2: 97% 96% 97% 98%  Weight:      Height:       Physical Exam  Constitutional: No distress.  HENT:    Head: Normocephalic.  Neck: Normal range of motion.  Cardiovascular:nromal rate.  Pulmonary/Chest: Effort normal.  Abdominal: Soft. Non distended.   Neurological: She is alert and awake.  Chronic hearing impairment. Right upper extremity and lower extremity 5 out of 5 Left upper extremity 0 out of 5, left lower extremity 0-1 out of 5.  Skin: warm and dry.  bruising on bilateral upper extremities.  CMP Latest Ref Rng & Units 11/07/2018  Glucose 70 - 99 mg/dL 99  BUN 8 - 23 mg/dL 10  Creatinine 0.44 - 1.00 mg/dL 0.68  Sodium 135 - 145 mmol/L 136  Potassium 3.5 - 5.1 mmol/L 3.1(L)  Chloride 98 - 111 mmol/L 104  CO2 22 - 32 mmol/L 23  Calcium 8.9 - 10.3 mg/dL 9.1  Total Protein 6.5 - 8.1 g/dL -  Total Bilirubin 0.3 - 1.2 mg/dL -  Alkaline Phos 38 - 126 U/L -  AST 15 - 41 U/L -  ALT 0 - 44 U/L -   CBC Latest Ref Rng & Units 11/07/2018  WBC 4.0 - 10.5 K/uL 2.4(L)  Hemoglobin 12.0 - 15.0 g/dL 7.9(L)  Hematocrit 36.0 - 46.0 % 23.6(L)  Platelets 150 - 400 K/uL 26(LL)   RADIOGRAPHIC STUDIES: I have personally reviewed the radiological images as listed and agreed with the findings in the report. Ct Abdomen Pelvis Wo Contrast  Result Date: 11/01/2018 CLINICAL DATA:  Possible cardiac arrest/hypotension, history of ovarian carcinoma EXAM: CT ABDOMEN AND PELVIS WITHOUT CONTRAST TECHNIQUE: Multidetector CT imaging of the abdomen and pelvis was performed following the standard protocol without IV contrast. COMPARISON:  10/16/2018 FINDINGS: Lower chest: Mild right basilar atelectasis is noted. Stable right-sided pleural effusion is seen. Hepatobiliary: No focal liver abnormality is seen. Status post cholecystectomy. No biliary dilatation. Pancreas: Unremarkable. No pancreatic ductal dilatation or surrounding inflammatory changes. Spleen: Normal in size without focal abnormality. Adrenals/Urinary Tract: Adrenal glands are within normal limits. Kidneys are well visualized without obstructive change.  Scattered small left renal calculi are noted better appreciated on the noncontrast study. The bladder is decompressed  by Foley catheter. Stomach/Bowel: Colon is predominately decompressed. No obstructive changes are seen. The appendix is not well visualized. No inflammatory changes to suggest appendicitis are seen. No small bowel obstructive changes are noted. Stomach is decompressed. Vascular/Lymphatic: Aortic atherosclerosis. No enlarged abdominal or pelvic lymph nodes. Reproductive: Uterus is stable in appearance. The ovaries are not visualized consistent with the recent surgical history. Other: Some mild peritoneal thickening is noted consistent with the patient's known history of omental disease. No free fluid is seen. Postsurgical changes are noted anteriorly. The degree of fluid along the surgical wound has decreased. Musculoskeletal: Degenerative changes of the lumbar spine are noted. No lytic or sclerotic lesions are seen. IMPRESSION: Stable right pleural effusion and right basilar atelectasis. Postoperative changes. 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 No acute abnormality is noted. Electronically Signed   By: Inez Catalina M.D.   On: 11/01/2018 23:42   Ct Angio Head W Or Wo Contrast  Result Date: 11/04/2018 CLINICAL DATA:  Left-sided weakness. EXAM: CT ANGIOGRAPHY HEAD AND NECK TECHNIQUE: Multidetector CT imaging of the head and neck was performed using the standard protocol during bolus administration of intravenous contrast. Multiplanar CT image reconstructions and MIPs were obtained to evaluate the vascular anatomy. Carotid stenosis measurements (when applicable) are obtained utilizing NASCET criteria, using the distal internal carotid diameter as the denominator. CONTRAST:  12mL OMNIPAQUE IOHEXOL 350 MG/ML SOLN COMPARISON:  Carotid Doppler ultrasound 02/15/2016. Chest CT 11/01/2018. FINDINGS: CTA NECK FINDINGS Aortic arch: Standard 3 vessel aortic  arch with widely patent arch vessel origins. Right carotid system: Patent with mild calcified plaque at the carotid bifurcation. No evidence of significant stenosis or dissection. Left carotid system: Patent with minimal calcified plaque at the carotid bifurcation. No evidence of significant stenosis or dissection. Vertebral arteries: Patent without evidence of stenosis or dissection. Mildly dominant left vertebral artery. Skeleton: Mild cervical spondylosis.  No suspicious osseous lesion. Other neck: No mass or enlarged lymph nodes. Upper chest: Partially visualized moderate-sized right pleural effusion, larger than on the recent chest CT. Associated compressive atelectasis in the right lung. Review of the MIP images confirms the above findings CTA HEAD FINDINGS Anterior circulation: The internal carotid arteries are patent from skull base to carotid termini with mild nonstenotic plaque bilaterally. ACAs and MCAs are patent with mild branch vessel irregularity but no evidence of proximal branch occlusion or significant proximal stenosis. The right ACA is dominant. No aneurysm is identified. Posterior circulation: The intracranial vertebral arteries are widely patent to the basilar. Patent left PICA, bilateral AICAs, and bilateral SCAs are identified. The basilar artery is widely patent. There is a fetal origin of the left PCA. A small right posterior communicating artery is present. Both PCAs are patent with branch vessel irregularity but no significant proximal stenosis. No aneurysm is identified. Venous sinuses: Patent. Anatomic variants: Fetal left PCA.  Dominant right ACA. Review of the MIP images confirms the above findings IMPRESSION: 1. Mild atherosclerosis in the head and neck without large vessel occlusion, significant stenosis, or aneurysm. 2. Moderate right pleural effusion, enlarged from 11/01/2018. These results were communicated to Dr. Lorraine Lax at 9:28 am on 11/04/2018 by text page via the [facility] messaging  system. Electronically Signed   By: Logan Bores M.D.   On: 11/04/2018 09:38   Ct Head Wo Contrast  Result Date: 11/02/2018 CLINICAL DATA:  Increased somnolence EXAM: CT HEAD WITHOUT CONTRAST TECHNIQUE: Contiguous axial images were obtained from the base of the skull through the vertex without  intravenous contrast. COMPARISON:  11/13/2017 FINDINGS: Brain: Mild atrophic changes are noted. Stable chronic white matter ischemic changes are seen. Areas of prior frontal and parietal infarcts on the left are noted and stable. Dilated perivascular space in the left lentiform nucleus is again seen and stable. No findings to suggest acute hemorrhage, acute infarction or space-occupying mass lesion are noted. Cavum septum pellucidum is again seen. Vascular: No hyperdense vessel or unexpected calcification. Skull: Normal. Negative for fracture or focal lesion. Sinuses/Orbits: No acute finding. Other: None. IMPRESSION: Chronic changes without acute abnormality. Electronically Signed   By: Inez Catalina M.D.   On: 11/02/2018 00:25   Ct Angio Neck W Or Wo Contrast  Result Date: 11/04/2018 CLINICAL DATA:  Left-sided weakness. EXAM: CT ANGIOGRAPHY HEAD AND NECK TECHNIQUE: Multidetector CT imaging of the head and neck was performed using the standard protocol during bolus administration of intravenous contrast. Multiplanar CT image reconstructions and MIPs were obtained to evaluate the vascular anatomy. Carotid stenosis measurements (when applicable) are obtained utilizing NASCET criteria, using the distal internal carotid diameter as the denominator. CONTRAST:  68mL OMNIPAQUE IOHEXOL 350 MG/ML SOLN COMPARISON:  Carotid Doppler ultrasound 02/15/2016. Chest CT 11/01/2018. FINDINGS: CTA NECK FINDINGS Aortic arch: Standard 3 vessel aortic arch with widely patent arch vessel origins. Right carotid system: Patent with mild calcified plaque at the carotid bifurcation. No evidence of significant stenosis or dissection. Left carotid  system: Patent with minimal calcified plaque at the carotid bifurcation. No evidence of significant stenosis or dissection. Vertebral arteries: Patent without evidence of stenosis or dissection. Mildly dominant left vertebral artery. Skeleton: Mild cervical spondylosis.  No suspicious osseous lesion. Other neck: No mass or enlarged lymph nodes. Upper chest: Partially visualized moderate-sized right pleural effusion, larger than on the recent chest CT. Associated compressive atelectasis in the right lung. Review of the MIP images confirms the above findings CTA HEAD FINDINGS Anterior circulation: The internal carotid arteries are patent from skull base to carotid termini with mild nonstenotic plaque bilaterally. ACAs and MCAs are patent with mild branch vessel irregularity but no evidence of proximal branch occlusion or significant proximal stenosis. The right ACA is dominant. No aneurysm is identified. Posterior circulation: The intracranial vertebral arteries are widely patent to the basilar. Patent left PICA, bilateral AICAs, and bilateral SCAs are identified. The basilar artery is widely patent. There is a fetal origin of the left PCA. A small right posterior communicating artery is present. Both PCAs are patent with branch vessel irregularity but no significant proximal stenosis. No aneurysm is identified. Venous sinuses: Patent. Anatomic variants: Fetal left PCA.  Dominant right ACA. Review of the MIP images confirms the above findings IMPRESSION: 1. Mild atherosclerosis in the head and neck without large vessel occlusion, significant stenosis, or aneurysm. 2. Moderate right pleural effusion, enlarged from 11/01/2018. These results were communicated to Dr. Lorraine Lax at 9:28 am on 11/04/2018 by text page via the [facility] messaging system. Electronically Signed   By: Logan Bores M.D.   On: 11/04/2018 09:38   Ct Chest Wo Contrast  Result Date: 11/02/2018 CLINICAL DATA:  History of ovarian carcinoma and hypotension  EXAM: CT CHEST WITHOUT CONTRAST TECHNIQUE: Multidetector CT imaging of the chest was performed following the standard protocol without IV contrast. COMPARISON:  Chest x-ray from earlier in the same day, CT from 11/13/2017 FINDINGS: Cardiovascular: Thoracic aorta shows atherosclerotic calcifications without aneurysmal dilatation. No cardiac enlargement is noted. Coronary calcifications are seen. The pulmonary artery is within normal limits. A few small air bubbles are  noted within the main pulmonary artery elated to the IV start. Mediastinum/Nodes: Nodular changes in the thyroid are again noted stable from the prior exam consistent with underlying goiter. No significant hilar or mediastinal adenopathy is noted. The esophagus as visualized is within normal limits. Lungs/Pleura: Mild left basilar atelectatic changes are noted. Right-sided pleural effusion is noted with some patchy basilar infiltrate/atelectasis. No significant nodularity is noted. Upper Abdomen: Within normal limits as previously seen on CT of the abdomen. Musculoskeletal: Degenerative changes of the thoracic spine are noted. No acute bony abnormality is noted. IMPRESSION: Stable nodular changes within the thyroid consistent with goiter. Right-sided pleural effusion with underlying atelectasis/infiltrate. Mild left basilar atelectasis is noted as well. Aortic Atherosclerosis (ICD10-I70.0). Electronically Signed   By: Inez Catalina M.D.   On: 11/02/2018 00:28   Mr Brain Wo Contrast  Result Date: 11/04/2018 CLINICAL DATA:  Left-sided numbness and weakness. History of ovarian cancer. EXAM: MRI HEAD WITHOUT CONTRAST TECHNIQUE: Multiplanar, multiecho pulse sequences of the brain and surrounding structures were obtained without intravenous contrast. COMPARISON:  Head CT 11/04/2018 and MRI 09/09/2011 FINDINGS: Brain: Small acute subcortical infarcts are present in the posterior aspect of the right corona radiata and posterior right parietal lobe. There is  also a 1 cm acute infarct anteriorly in the left cerebellar hemisphere. No intracranial hemorrhage, mass, midline shift, or extra-axial fluid collection is identified. Chronic cortical infarcts are again seen in the left frontal and left parietal lobes. There also tiny chronic cortical infarcts in the right frontal lobe and right temporoparietal junction. Small chronic infarcts are also again seen in the cerebellum bilaterally. There are dilated perivascular spaces in the basal ganglia bilaterally including a 2 cm perivascular space on the left. A cavum septum pellucidum et vergae is incidentally noted. Patchy T2 hyperintensities in the cerebral white matter bilaterally have progressed from the prior MRI and are nonspecific but compatible with moderate chronic small vessel ischemic disease. Moderate cerebral atrophy has also mildly progressed from the prior MRI. Vascular: Major intracranial vascular flow voids are preserved. Skull and upper cervical spine: Unremarkable bone marrow signal. Sinuses/Orbits: Bilateral cataract extraction. Paranasal sinuses and mastoid air cells are clear. Other: None. IMPRESSION: 1. Small acute infarcts in the right corona radiata, subcortical right parietal lobe, and left cerebellum. 2. Moderate chronic small vessel ischemic disease with chronic infarcts as above. Electronically Signed   By: Logan Bores M.D.   On: 11/04/2018 11:57   Dg Chest Port 1 View  Result Date: 11/01/2018 CLINICAL DATA:  63 y/o  F; loss of consciousness. EXAM: PORTABLE CHEST 1 VIEW COMPARISON:  08/17/2018 chest radiograph FINDINGS: Given positioning the lung apices are obscured by the patient's face and neck. Stable cardiac silhouette given projection and technique. Right port catheter tip projects over cavoatrial junction. Blunted costal diaphragmatic angles, likely small effusions. Pulmonary reticular opacities. No pneumothorax. No acute osseous abnormality is evident. IMPRESSION: Blunted costal  diaphragmatic angles, likely small effusions. Pulmonary reticular opacities may represent interstitial edema. Electronically Signed   By: Kristine Garbe M.D.   On: 11/01/2018 22:47   Ct Head Code Stroke Wo Contrast`  Result Date: 11/04/2018 CLINICAL DATA:  Code stroke.  Left-sided weakness. EXAM: CT HEAD WITHOUT CONTRAST TECHNIQUE: Contiguous axial images were obtained from the base of the skull through the vertex without intravenous contrast. COMPARISON:  11/01/2018 FINDINGS: Brain: There is no evidence of acute infarct, intracranial hemorrhage, mass, midline shift, or extra-axial fluid collection. Chronic infarcts are again seen in the left frontal lobe, left parietal  lobe, and bilateral cerebellum. A prominent dilated perivascular space in the left lentiform nucleus is unchanged. Patchy hypodensities in the cerebral white matter bilaterally are unchanged and nonspecific but compatible with moderate chronic small vessel ischemic disease. There is mild-to-moderate cerebral atrophy. A cavum septum pellucidum et vergae is again noted Vascular: Calcified atherosclerosis at the skull base. No hyperdense vessel. Skull: No fracture or focal osseous lesion. Sinuses/Orbits: Left sphenoid sinus mucosal thickening. Clear mastoid air cells. Bilateral cataract extraction. Other: None ASPECTS ([place] Stroke Program Early CT Score) - Ganglionic level infarction (caudate, lentiform nuclei, internal capsule, insula, M1-M3 cortex): 7 - Supraganglionic infarction (M4-M6 cortex): 3 Total score (0-10 with 10 being normal): 10 IMPRESSION: 1. No evidence of acute intracranial abnormality. 2. ASPECTS is 10. 3. Moderate chronic small vessel ischemic disease with chronic infarcts as above. These results were communicated to Dr. Lorraine Lax at 9:28 am on 11/04/2018 by text page via the [facility] messaging system. Electronically Signed   By: Logan Bores M.D.   On: 11/04/2018 09:28    Assessment and plan-  Patient is a 63 y.o. female  history of high-grade serous carcinoma status post surgery, CAD, CHF, GERD, hyperlipidemia, hypertension, mitral valve disease, sleep apnea previous stroke who is currently admitted due to altered mental status, sepsis, and also developed acute stroke during her hospitalization.  #Thrombocytopenia, today's platelet counts 26,000, trending up.  No bleeding events. Continue monitor counts daily.   #Acute CVA, antiplatelet treatment was held due to severe thrombocytopenia.  Once platelet counts above 50,000, can start antiplatelet therapy.  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 Did not tolerate chemotherapy.  We will see how she recovers from acute illness.  Husband tells me that patient told him that she wants to stay local and have me comanage with [facility] for further management and treatment.  Husband asks about the plan for cancer treatment. Gust with husband over the phone that no chemotherapy treatment during acute setting. If she recovers well and be able to discharge to a rehab facility, we can discuss details outpatient.  He voices understanding and appreciate explanation.  # Leukopenia, chemotherapy induced.  Trending up.  Continue to monitor. # Anemia, hemoglobin 7.9.. Continue to monitor.   I called patient's husband and updated above plan.  Thank you for allowing me to participate in the care of this patient.   Earlie Server, MD, PhD Hematology Oncology [facility] at [facility] Pager- [phone] 11/07/2018

## 2018-11-07 NOTE — TOC Progression Note (Signed)
Transition of Care [facility]) - Progression Note    Patient Details  Name: ZAIRAH ARISTA MRN: [id number] Date of Birth: 09/22/1955  Transition of Care [facility]) CM/SW [place], RN Phone Number: 11/07/2018, 2:32 PM  Clinical Narrative:    Barbara from [facility] called and said that they would come and talk to the patient and call her husband to evaluate if they are appropriate for CIR, will come later today   Expected Discharge Plan: [place] Barriers to Discharge: Continued Medical Work up  Expected Discharge Plan and Services Expected Discharge Plan: [place]   Discharge Planning Services: CM Consult Post Acute Care Choice: [place] arrangements for the past 2 months: Single Family Home Expected Discharge Date: 11/06/18                 DME Agency: (home health already in place)       Social Determinants of Health (SDOH) Interventions    Readmission Risk Interventions No flowsheet data found.

## 2018-11-07 NOTE — TOC Progression Note (Signed)
Transition of Care [facility]) - Progression Note    Patient Details  Name: Destiny [name] MRN: [id number] Date of Birth: [birth year]  Transition of Care [facility]) CM/SW Contact  Su Hilt, RN Phone Number: 11/07/2018, 11:32 AM  Clinical Narrative:     Called and left a VM for a return call to [place] to try to get a bed for SNF  Expected Discharge Plan: [place] Barriers to Discharge: Continued Medical Work up  Expected Discharge Plan and Services Expected Discharge Plan: [place]   Discharge Planning Services: CM Consult Post Acute Care Choice: [place] arrangements for the past 2 months: Single Family Home Expected Discharge Date: 11/06/18                 DME Agency: (home health already in place)       Social Determinants of Health (SDOH) Interventions    Readmission Risk Interventions No flowsheet data found.

## 2018-11-07 NOTE — Progress Notes (Signed)
Physical Therapy Treatment Patient Details Name: Destiny [name] MRN: [id number] DOB: 02-15-1956 Today's Date: 11/07/2018    History of Present Illness Destiny [name] is a 63 y.o. female who was recently discharged with recent very complicated and prolonged stays at [facility].  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 antibiotics for 14 days, she was apparently status post 2 doses of carboplatin and planning for chemotherapy as an outpatient when she arrived to hospital ED on 11/01/18 by EMS after her family reported that she had become unarousable. She was admitted to the hospital for hypotension, hypokalemia, chronic diastolic heart failure. On 11/04/18 pt developed left sided hemiparesis. MRI revealed small infarcts in subcortical white matter as well right cerebellum.  Relevant PMH includes CHF, CAD, CVA x3, female bladder prolapse, GERD, HTN, mitral valve disease, ovarian cancer, cholecystecotmy.    PT Comments    Pt agreeable to PT/OT co-treat.  L LE appearing stronger today.  Supine to sit with mod to max assist x1.  Pt performed ADL's/self care with OT sitting edge of bed (sitting balance assist varying from CGA to min assist to  occasional mod assist).  Improved ability to stand noted today (progressed today to min assist x2 to CGA to min assist x2 with R UE support).  Pt also able to progress to taking 1 lateral step B LE's to R with mod assist x2 (R UE support on hemi-walker vs therapists arm for support) before knees buckling and pt assisted back to sitting on edge of bed for safety.  Then able to progress to performing stand pivot to R bed to recliner with mod assist x2.  Pt continues to be very motivated to participate in therapy (although tends to have more of a flat affect).  Will continue to focus on strengthening and progressive functional mobility during hospital stay.  PT recommendations updated to CIR (SW notified).   Follow Up Recommendations  CIR     Equipment Recommendations  Wheelchair (measurements PT);Wheelchair cushion (measurements PT);3in1 (PT)    Recommendations for Other Services OT consult;Speech consult     Precautions / Restrictions Precautions Precautions: Fall;Other (comment) Precaution Comments: hemiparesis L arm and leg; abdominal wound; R chest port Restrictions Weight Bearing Restrictions: No    Mobility  Bed Mobility Overal bed mobility: Needs Assistance Bed Mobility: Supine to Sit     Supine to sit: Mod assist;Max assist     General bed mobility comments: assist for trunk and L UE/L LE supine to sit; minimal assist to scoot to edge of bed  Transfers Overall transfer level: Needs assistance Equipment used: Hemi-walker;None Transfers: Sit to/from [employer] Transfers Sit to Stand: Min assist;Min guard;+2 physical assistance Stand pivot transfers: Mod assist;+2 physical assistance  General transfer comment: x3 trials standing (min assist x2 first trial with R UE on hemi-walker; CGA to min assist x2 2nd trial with R UE on hemi-walker and 3rd trial with R UE on therapists arm); vc's and assist for B LE placement  Ambulation/Gait Ambulation/Gait assistance:  Mod assist;+2 physical assistance Gait Distance (Feet): (3 trials of 1 step laterally to R with B LE's) Assistive device: Hemi-walker;1 person hand held assist(hemi-walker x2 trials; R UE on therapists arms 3rd trial)   Gait velocity: decreased   General Gait Details: pt with difficulty clearing B LE's (sliding feet on ground); required assist for weight shifting with L knee blocked; L>R knee buckling after each trial requiring assist to sit down on bed safely; vc's for technique required as well as upright posture   Stairs             Wheelchair Mobility    Modified Rankin (Stroke Patients Only)       Balance Overall balance assessment: Needs assistance Sitting-balance support: Single extremity supported;Feet supported Sitting balance-Leahy Scale: Poor Sitting balance - Comments: pt initially requiring R UE support on bed for static sitting balance (close SBA to CGA for safety); with ADL's/self care activities with OT requiring CGA to min assist to occasionally mod assist for sitting balance Postural control: Posterior lean   Standing balance-Leahy Scale: Poor Standing balance comment: CGA to min assist x2 with vc's for upright posture required (static standing)                            Cognition Arousal/Alertness: Awake/alert Behavior During Therapy: WFL for tasks assessed/performed Overall Cognitive Status: Within Functional Limits for tasks assessed                                 General Comments: Inconsistent and increased time with multi-step commands      Exercises      General Comments   Nursing cleared pt for participation in physical therapy.  Pt agreeable to PT session.      Pertinent Vitals/Pain Pain Assessment: Faces Faces Pain Scale: No hurt Pain Intervention(s): Limited activity within patient's tolerance;Monitored during session;Repositioned  HR 106 bpm at rest but increased up to 129 bpm with activity (decreased with  sitting rest).    Home Living                      Prior Function            PT Goals (current goals can now be found in the care plan section) Acute Rehab PT Goals Patient Stated Goal: use my L side again PT Goal Formulation: With patient Time For Goal Achievement: 11/18/18 Potential to Achieve Goals: Fair Progress towards PT goals: Progressing toward goals    Frequency    7X/week      PT Plan Discharge plan needs to be updated    Co-evaluation PT/OT/SLP Co-Evaluation/Treatment: Yes Reason for Co-Treatment: For patient/therapist safety;To address functional/ADL transfers PT goals addressed during session: Mobility/safety with mobility;Balance;Proper use of DME OT goals addressed during session: ADL's and self-care;Proper use of Adaptive equipment and DME      AM-PAC PT "6 Clicks" Mobility   Outcome Measure  Help needed turning from your back to your side while in a flat bed without using bedrails?: A Lot Help needed moving from lying on your back to sitting on  the side of a flat bed without using bedrails?: A Lot Help needed moving to and from a bed to a chair (including a wheelchair)?: A Lot Help needed standing up from a chair using your arms (e.g., wheelchair or bedside chair)?: A Lot Help needed to walk in hospital room?: Total Help needed climbing 3-5 steps with a railing? : Total 6 Click Score: 10    End of Session Equipment Utilized During Treatment: Gait belt(between abdominal wounds and R chest port--under B breasts)--pt reporting pain in L breast with use of gait belt so therapist switched hand positioning to under pt's arms with hands holding under pt's scapula and pt reporting improved comfort) Activity Tolerance: Patient tolerated treatment well Patient left: in chair;with call bell/phone within reach;with chair alarm set;with SCD's reapplied Nurse Communication: Mobility status;Need for lift equipment;Precautions PT Visit Diagnosis:  Unsteadiness on feet (R26.81);Muscle weakness (generalized) (M62.81);Other abnormalities of gait and mobility (R26.89);Hemiplegia and hemiparesis Hemiplegia - Right/Left: Left Hemiplegia - caused by: Cerebral infarction     Time: 7412-8786 PT Time Calculation (min) (ACUTE ONLY): 45 min  Charges:  $Therapeutic Activity: 23-37 mins                     Leitha Bleak, PT 11/07/18, 10:26 AM [phone]

## 2018-11-07 NOTE — Progress Notes (Signed)
Inpatient Rehabilitation Admissions Coordinator  I met with patient at bedside for rehab assessment and spoke with her spouse, Kasandra Knudsen, by phone. We discussed goals and expectations of an inpt rehab admission. He prefers inpt rehab rather that return to SNF as she was just at [facility] twice since February. I will discuss with rehab MD tomorrow and verify bed availability for Thursday or Friday for admission. Discussed with RN CM, Delilah, and follow up tomorrow.  Danne Baxter, RN, MSN Rehab Admissions Coordinator [phone] 11/07/2018 4:17 PM

## 2018-11-07 NOTE — Progress Notes (Signed)
Rehab Admissions Coordinator Note:  Patient was screened by Cleatrice Burke for appropriateness for an Inpatient Acute Rehab admit to [place] per PT and OT recommendation. I contacted RN CM, Deliliah as well as patient by phone. I will meet with patient today at 4 pm for bedside rehab assessment.   Cleatrice Burke RN MSN 11/07/2018, 12:59 PM  I can be reached at [phone].

## 2018-11-07 NOTE — Progress Notes (Signed)
[place] at [place] NAME: Destiny [name]    MR#:  [id number]  DATE OF BIRTH:  [birth year]  SUBJECTIVE:   Still has significant left upper extremity weakness.  Left lower extremity weakness is improving.   Plt. Count remains low but stable. No acute bleeding.   REVIEW OF SYSTEMS:    Review of Systems  Constitutional: Negative for chills and fever.  HENT: Negative for congestion and tinnitus.   Eyes: Negative for blurred vision and double vision.  Respiratory: Negative for cough, shortness of breath and wheezing.   Cardiovascular: Negative for chest pain, orthopnea and PND.  Gastrointestinal: Positive for nausea. Negative for abdominal pain, diarrhea and vomiting.  Genitourinary: Negative for dysuria and hematuria.  Neurological: Positive for weakness (Left upper ext > LLE.  ). Negative for dizziness, sensory change and focal weakness.  All other systems reviewed and are negative.   Nutrition: Heart Healthy Tolerating Diet: Yes Tolerating PT: Eval noted.   DRUG ALLERGIES:   Allergies  Allergen Reactions  . Atorvastatin Hives    No s/sx of anaphylaxis, just intermittent hives.  Does not seem to be a class effect as she has tolerated lovastatin previously No s/sx of anaphylaxis, just intermittent hives.  Does not seem to be a class effect as she has tolerated lovastatin previously  . Fluconazole Rash    Reaction to generic but not name brand Reaction to generic but not name brand CAN USE DIFLUCAN.DOES NOT BREAK OUT WITH DIFLUCAN.(allergic only to generic brand)  . Sertraline Itching  . Sulfa Antibiotics Rash and Other (See Comments)    "hot " sensation  . Sulfamethoxazole-Trimethoprim Rash and Other (See Comments)    "felt hot" Other reaction(s): Other (See Comments) "felt hot"  . Sulfasalazine Other (See Comments) and Rash    "hot " sensation    VITALS:  Blood pressure 114/85, pulse 92, temperature 98.4 F (36.9 C),  temperature source Oral, resp. rate 17, height 5' 2"  (1.575 m), weight 90.9 kg, SpO2 98 %.  PHYSICAL EXAMINATION:   Physical Exam  GENERAL:  63 y.o.-year-old patient lying in bed in no acute distress.  EYES: Pupils equal, round, reactive to light and accommodation. No scleral icterus. Extraocular muscles intact.  HEENT: Head atraumatic, normocephalic. Oropharynx and nasopharynx clear.  NECK:  Supple, no jugular venous distention. No thyroid enlargement, no tenderness.  LUNGS: Normal breath sounds bilaterally, no wheezing, rales, rhonchi. No use of accessory muscles of respiration.  CARDIOVASCULAR: S1, S2 normal. No murmurs, rubs, or gallops.  ABDOMEN: Soft, nontender, nondistended. Bowel sounds present. No organomegaly or mass.  EXTREMITIES: No cyanosis, clubbing or edema b/l.    NEUROLOGIC: Cranial nerves II through XII are intact.  Left-sided weakness more on the left upper extremity than the lower extremity.   PSYCHIATRIC: The patient is alert and oriented x 3.  SKIN: No obvious rash, lesion, or ulcer.    LABORATORY PANEL:   CBC Recent Labs  Lab 11/07/18 0524  WBC 2.4*  HGB 7.9*  HCT 23.6*  PLT 26*   ------------------------------------------------------------------------------------------------------------------  Chemistries  Recent Labs  Lab 11/01/18 2145  11/07/18 0524  NA 131*   < > 136  K 2.1*   < > 3.1*  CL 94*   < > 104  CO2 23   < > 23  GLUCOSE 123*   < > 99  BUN 26*   < > 10  CREATININE 1.06*   < > 0.68  CALCIUM 8.1*   < >  9.1  MG 1.6*   < > 2.0  AST 24  --   --   ALT 27  --   --   ALKPHOS 75  --   --   BILITOT 1.2  --   --    < > = values in this interval not displayed.   ------------------------------------------------------------------------------------------------------------------  Cardiac Enzymes Recent Labs  Lab 11/01/18 2145  TROPONINI <0.03    ------------------------------------------------------------------------------------------------------------------  RADIOLOGY:  No results found.   ASSESSMENT AND PLAN:   63 year old female with past medical history of ovarian cancer, hypertension, hyperlipidemia, GERD, history of previous CVA, CHF, coronary disease, presented to the hospital due to weakness/altered mental status and also noted to be hypokalemic with suspected sepsis.  1.  Sepsis-patient met criteria on admission given her fever, hypotension and suspected pneumonia on CT chest. - improved/ruled out.  Cultures remain negative.  Will DC antibiotics today.  Afebrile, hemodynamically stable.  2. Acute CVA- patient developed left-sided facial droop and left upper extremity weakness on 11/04/18.   -   Underwent CT of the head which was negative for acute pathology.  Neurology consult obtained.  Patient underwent CTA of the head neck which is negative for large vessel disease. - MRI was positive for right-sided coronary radiata and thalamic small vessel CVA. Given her severe anemia and thrombocytopenia patient currently is not on antiplatelet agents. -Continue PT, speech, echocardiogram done showing no acute evidence of thrombus.  3.  Hypokalemia/Hypomagnesemia- continue supplement orally and repeat level in the morning.  Patient's magnesium level is normal now.  4.  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 -Seen by hematology oncology and no evidence of DIC or any further etiology of thrombocytopenia.  Continue to follow serial counts.  Once platelet count gets over 50,000 we can initiate antiplatelet therapy for patient's CVA.  5.  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 - Appreciate hematology oncology input by Dr. Tasia Catchings and plan to follow-up with her as an outpatient to see if she  could tolerate further treatment.  6.  GERD-continue Pepcid.  7.  History of urinary incontinence-continue oxybutynin.  8.  Anxiety-continue Paxil, Klonopin.  9. Hyperlipidemia - cont. Crestor.    10. Depression - cont. Effexor.    All the records are reviewed and case discussed with Care Management/Social Worker. Management plans discussed with the patient, family and they are in agreement.  CODE STATUS: Full code  DVT Prophylaxis: Lovenox  TOTAL TIME TAKING CARE OF THIS PATIENT: 30 minutes.   POSSIBLE D/C unclear, DEPENDING ON CLINICAL CONDITION and social work dispo input.  Henreitta Leber M.D on 11/07/2018 at 1:30 PM  Between 7am to 6pm - Pager - [phone]  After 6pm go to [email or web address] - Proofreader  [facility] [place] [facility]  Office  [phone]  CC: Primary care physician; [name], MD

## 2018-11-08 LAB — BASIC METABOLIC PANEL
Anion gap: 8 (ref 5–15)
BUN: 9 mg/dL (ref 8–23)
CO2: 23 mmol/L (ref 22–32)
Calcium: 9.1 mg/dL (ref 8.9–10.3)
Chloride: 107 mmol/L (ref 98–111)
Creatinine, Ser: 0.53 mg/dL (ref 0.44–1.00)
GFR calc Af Amer: >60 mL/min (ref >60)
GFR calc non Af Amer: >60 mL/min (ref >60)
Glucose, Bld: 97 mg/dL (ref 70–99)
Potassium: 3.6 mmol/L (ref 3.5–5.1)
Sodium: 138 mmol/L (ref 135–145)

## 2018-11-08 LAB — CBC
HCT: 24.2 % — ABNORMAL LOW (ref 36.0–46.0)
Hemoglobin: 7.9 g/dL — ABNORMAL LOW (ref 12.0–15.0)
MCH: 29.2 pg (ref 26.0–34.0)
MCHC: 32.6 g/dL (ref 30.0–36.0)
MCV: 89.3 fL (ref 80.0–100.0)
Platelets: 37 10*3/uL — ABNORMAL LOW (ref 150–400)
RBC: 2.71 MIL/uL — ABNORMAL LOW (ref 3.87–5.11)
RDW: 14.5 % (ref 11.5–15.5)
WBC: 2.8 10*3/uL — ABNORMAL LOW (ref 4.0–10.5)
nRBC: 0 % (ref 0.0–0.2)

## 2018-11-08 MED ORDER — BOOST / RESOURCE BREEZE PO LIQD CUSTOM
1.0000 | Freq: Three times a day (TID) | ORAL | Status: DC
  Administered 2018-11-08 – 2018-11-09 (×3): 1 via ORAL

## 2018-11-08 NOTE — Plan of Care (Signed)
  Problem: Activity: Goal: Ability to tolerate increased activity will improve Outcome: Progressing   Problem: Education: Goal: Knowledge of secondary prevention will improve Outcome: Progressing Goal: Knowledge of patient specific risk factors addressed and post discharge goals established will improve Outcome: Progressing   Problem: Coping: Goal: Will identify appropriate support needs Outcome: Progressing   Problem: Health Behavior/Discharge Planning: Goal: Ability to manage health-related needs will improve Outcome: Progressing   Problem: Self-Care: Goal: Ability to participate in self-care as condition permits will improve Outcome: Progressing Goal: Ability to communicate needs accurately will improve Outcome: Progressing   Problem: Nutrition: Goal: Risk of aspiration will decrease Outcome: Progressing   Problem: Ischemic Stroke/TIA Tissue Perfusion: Goal: Complications of ischemic stroke/TIA will be minimized Outcome: Progressing   Problem: Education: Goal: Knowledge of General Education information will improve Description Including pain rating scale, medication(s)/side effects and non-pharmacologic comfort measures Outcome: Progressing   Problem: Safety: Goal: Ability to remain free from injury will improve Outcome: Progressing

## 2018-11-08 NOTE — TOC Progression Note (Signed)
Transition of Care [facility]) - Progression Note    Patient Details  Name: Destiny [name] MRN: [id number] Date of Birth: April 22, 1956  Transition of Care [facility]) CM/SW [place], RN Phone Number: 11/08/2018, 9:52 AM  Clinical Narrative:     Milagros Reap with CIR at [phone] and left a VM for a return call requesting information as to when a bed would be available for the patient to DC to CIR  Expected Discharge Plan: [place] Barriers to Discharge: Continued Medical Work up  Expected Discharge Plan and Services Expected Discharge Plan: [place]   Discharge Planning Services: CM Consult Post Acute Care Choice: [place] arrangements for the past 2 months: Single Family Home Expected Discharge Date: 11/06/18                 DME Agency: (home health already in place)                   Social Determinants of Health (SDOH) Interventions    Readmission Risk Interventions No flowsheet data found.

## 2018-11-08 NOTE — Progress Notes (Signed)
Patient had new slurred speech this shift, patient alert and oriented, BP WNL,  MD notified, no new orders. 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  Patients husband undated. Will continue to monitor.

## 2018-11-08 NOTE — Progress Notes (Signed)
Physical Therapy Treatment Patient Details Name: JANEA SCHWENN MRN: [id number] DOB: 1956/01/04 Today's Date: 11/08/2018    History of Present Illness STPEHANIE MONTROY is a 63 y.o. female who was recently discharged with recent very complicated and prolonged stays at [facility].  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 antibiotics for 14 days, she was apparently status post 2 doses of carboplatin and planning for chemotherapy as an outpatient when she arrived to hospital ED on 11/01/18 by EMS after her family reported that she had become unarousable. She was admitted to the hospital for hypotension, hypokalemia, chronic diastolic heart failure. On 11/04/18 pt developed left sided hemiparesis. MRI revealed small infarcts in subcortical white matter as well right cerebellum.  Relevant PMH includes CHF, CAD, CVA x3, female bladder prolapse, GERD, HTN, mitral valve disease, ovarian cancer, cholecystecotmy.    PT Comments    When getting OOB pt noted to be moving L UE some (able to flex L fingers mildly; brachioradialis >biceps contraction 2 to 2+/5; shoulder flexion 2-/5).  Trialed standing up to no AD, hemi-walker, and L platform walker with min  assist x2.  Pt tended to pull up on DME so switched to pt holding onto therapists arm (with pt's R UE) once standing.  Assist required to shift weight but pt able to progress to taking steps laterally to R (with L knee blocked to advance R LE) going from bed to recliner (about 3 foot distance).  Pt initially with flat affect and then occasionally smiling; pt tearful towards end of session d/t reports of missing her husband; therapist listened and emotional support provided (offered chaplain multiple times but pt declined).  Will continue to progress pt with strengthening and progressive functional mobility during hospital stay.   Follow Up Recommendations  CIR     Equipment Recommendations  Wheelchair (measurements PT);Wheelchair cushion (measurements PT);3in1 (PT)    Recommendations for Other Services OT consult;Speech consult     Precautions / Restrictions Precautions Precautions: Fall;Other (comment) Precaution Comments: hemiparesis L arm and leg; abdominal wound; R chest port Restrictions Weight Bearing Restrictions: No    Mobility  Bed Mobility Overal bed mobility: Needs Assistance Bed Mobility: Supine to Sit     Supine to sit: Mod assist;Max assist     General bed mobility comments: assist for trunk and L UE supine to sit; minimal assist to scoot to edge of bed  Transfers Overall transfer level: Needs assistance Equipment used: None;Hemi-walker;Left platform walker Transfers: Sit to/from Stand Sit to Stand: Min assist;+2 physical assistance         General transfer comment: x2 trials standing with no AD (R UE reaching for therapists arm for balance upon standing); x1 trial with R hemi-walker; x2 trials with L platform walker; assist  to initiate and come to full stand; vc's for B LE and B UE placement and DME use  Ambulation/Gait Ambulation/Gait assistance: Mod assist;+2 physical assistance Gait Distance (Feet): (bed to recliner (about 3 feet sidestepping)) Assistive  device: None(R UE holding onto therapists arm for support)   Gait velocity: decreased   General Gait Details: assist to weight shift; assist to block L knee to prevent buckling with movement of R LE; vc's for technique   Stairs             Wheelchair Mobility    Modified Rankin (Stroke Patients Only)       Balance Overall balance assessment: Needs assistance Sitting-balance support: Single extremity supported;Feet supported Sitting balance-Leahy Scale: Poor Sitting balance - Comments: pt initially requiring R UE support on bed for static sitting balance (close SBA to CGA for safety) but able to progress to sitting with B UE's in lap static sitting with close SBA to CGA for safety Postural control: Posterior lean   Standing balance-Leahy Scale: Poor Standing balance comment: CGA to min assist x2 with vc's for upright posture required (static standing)                            Cognition Arousal/Alertness: Awake/alert Behavior During Therapy: WFL for tasks assessed/performed;Flat affect Overall Cognitive Status: Within Functional Limits for tasks assessed                                 General Comments: Inconsistent with multi-step commands (but improved from previous sessions)      Exercises General Exercises - Lower Extremity Long Arc Quad: AROM;Right;AAROM;Left;Strengthening;10 reps;Seated Hip Flexion/Marching: AROM;Right;AAROM;Left;Strengthening;10 reps;Seated  Standing marching x5 reps R LE (with L knee blocked) and x5 reps L LE (mod assist x2; R UE support on therapists arm)    General Comments  Pt agreeable to PT session.      Pertinent Vitals/Pain Pain Assessment: No/denies pain Pain Intervention(s): Limited activity within patient's tolerance;Monitored during session;Repositioned  HR 110 bpm at rest; increased briefly to 137 bpm with activity    Home Living                      Prior Function            PT  Goals (current goals can now be found in the care plan section) Acute Rehab PT Goals Patient Stated Goal: use my L side again PT Goal Formulation: With patient Time For Goal Achievement: 11/18/18 Potential to Achieve Goals: Fair Progress towards PT goals: Progressing toward goals    Frequency    7X/week      PT Plan Current plan remains appropriate    Co-evaluation              AM-PAC PT "6 Clicks" Mobility   Outcome Measure  Help needed turning from your back to your side while in a flat bed without using bedrails?: A Lot Help needed moving from lying on your back to sitting on the side of a flat bed without using bedrails?: A Lot Help needed moving to and from a bed to a chair (including a wheelchair)?: A Lot Help needed standing up from a chair using your arms (e.g., wheelchair or bedside chair)?: A Lot Help needed to walk in hospital room?: Total Help needed climbing 3-5 steps with a railing? : Total 6 Click Score: 10  End of Session Equipment Utilized During Treatment: Gait belt(gait belt placed just under B breasts (to avoid abdominal surgical sites and R chest port)) Activity Tolerance: Patient tolerated treatment well Patient left: in chair;with call bell/phone within reach;with chair alarm set;with SCD's reapplied;Other (comment)(L UE elevated on 2 pillows) Nurse Communication: Mobility status;Need for lift equipment;Precautions PT Visit Diagnosis: Unsteadiness on feet (R26.81);Muscle weakness (generalized) (M62.81);Other abnormalities of gait and mobility (R26.89);Hemiplegia and hemiparesis Hemiplegia - Right/Left: Left Hemiplegia - caused by: Cerebral infarction     Time: 3754-2370 PT Time Calculation (min) (ACUTE ONLY): 53 min  Charges:  $Gait Training: 8-22 mins $Therapeutic Exercise: 8-22 mins $Therapeutic Activity: 23-37 mins                    Leitha Bleak, PT 11/08/18, 3:49 PM [phone]

## 2018-11-08 NOTE — Progress Notes (Signed)
Nutrition Follow-up  RD working remotely.  DOCUMENTATION CODES:   Obesity unspecified  INTERVENTION:  Will discontinue Ensure Enlive per patient request.  Provide Boost Breeze po TID, each supplement provides 250 kcal and 9 grams of protein.  Continue Magic cup TID with meals, each supplement provides 290 kcal and 9 grams of protein.  Continue Ocuvite daily for wound healing (provides zinc, vitamin A, vitamin C, Vitamin E, copper, and selenium).  NUTRITION DIAGNOSIS:   Inadequate oral intake related to decreased appetite as evidenced by meal completion < 50%.  Ongoing.  GOAL:   Patient will meet greater than or equal to 90% of their needs  Progressing with new ONS.  MONITOR:   PO intake, Supplement acceptance, Labs, Weight trends, Skin, I & O's  REASON FOR ASSESSMENT:   Consult Assessment of nutrition requirement/status  ASSESSMENT:   63 year old female with PMHx of sleep apnea, hx CVA x 3, GERD, CAD, HTN, HLD, CHF, hx ovarian cancer who is admitted with weakness/AMS, sepsis, suspected PNA.  Spoke with patient over the phone. She reports her appetite is getting better. However she seems a little confused and is unable to report how much of her meals she is finishing. Per chart she is eating very poorly still and only taking in bites at most at meals. She had 0% of breakfast this morning. She reports she does not like Ensure Enlive ("milky one") and prefers Boost Breeze ("juice one"). Will change ONS. Discussed with SLP earlier in week.  Medications reviewed and include: Colace 100 mg daily PO, famotidine 20 mg BID PO, Ocuvite daily PO, potassium chloride 40 mEq BID PO, Crestor 20 mg daily PO, spironolactone 12.5 mg daily PO.  Labs reviewed.  Diet Order:   Diet Order            Diet 2 gram sodium Room service appropriate? Yes with Assist; Fluid consistency: Thin; Fluid restriction: 1200 mL Fluid  Diet effective now             EDUCATION NEEDS:   Not  appropriate for education at this time  Skin:  Skin Assessment: Skin Integrity Issues:(open incision to abdomen)  Last BM:  11/07/2018 per chart but no bowel characteristics documented  Height:   Ht Readings from Last 1 Encounters:  11/02/18 5\' 2"  (1.575 m)   Weight:   Wt Readings from Last 1 Encounters:  11/02/18 90.9 kg   Ideal Body Weight:     BMI:  Body mass index is 36.67 kg/m.  Estimated Nutritional Needs:   Kcal:  1700-1900  Protein:  90-100 grams  Fluid:  1.5 L/day  Willey Blade, MS, RD, LDN Office: [phone] Pager: [phone] After Hours/Weekend Pager: [phone]

## 2018-11-08 NOTE — TOC Progression Note (Signed)
Transition of Care [facility]) - Progression Note    Patient Details  Name: Destiny [name] MRN: [id number] Date of Birth: 1956/05/12  Transition of Care [facility]) CM/SW [place], RN Phone Number: 11/08/2018, 9:59 AM  Clinical Narrative:     Damaris Schooner with [facility] from [facility], they do not have a bed available today and not sure about tomorrow, She will call first thing in the morning to see if they will have a bed available for tomorrow   Expected Discharge Plan: [place] Barriers to Discharge: Continued Medical Work up  Expected Discharge Plan and Services Expected Discharge Plan: [place]   Discharge Planning Services: CM Consult Post Acute Care Choice: [place] arrangements for the past 2 months: Single Family Home Expected Discharge Date: 11/06/18                 DME Agency: (home health already in place)                   Social Determinants of Health (SDOH) Interventions    Readmission Risk Interventions No flowsheet data found.

## 2018-11-08 NOTE — PMR Pre-admission (Signed)
PMR Admission Coordinator Pre-Admission Assessment  Patient: Destiny [name] is an 63 y.o., female MRN: [id number] DOB: 01-01-1956 Height: 5' 2"  (157.5 cm) Weight: 90.9 kg  Insurance Information  PRIMARY: uninsured      Medicaid application 07/23/9448. Case worker is Wilburn Cornelia phone [phone] Disability application begun  Spouse reports financial counselor at [employer] phone [phone] Disability phone [phone]  Medicaid Application Date: 01/18/8269      Case Manager: Elie Confer Revels  Disability Application Date: unknown      Case Worker: [email or web address]  The Data Collection Information Summary for patients in Inpatient Rehabilitation Facilities with attached Privacy Act [place] Records was provided and verbally reviewed with: N/A  Emergency Contact Information Contact Information    Name Relation Home Work Mobile   [place] [phone]  [phone]   Jamilla, Galli   [phone]      Current Medical History  Patient Admitting Diagnosis: Sepsis secondary to PNA; acute CVA  History of Present Illness: 63 year old female with history of CAD, MVP, OSA, prior strokes, malnutrition, depression,  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 She was admitted to [facility] 4/16 with reports of somnolence most of the day. She was found to be hypotensive, dehydrated, significantly hypokalemic with K+ 2.1 and was difficult to arouse. She was started on IV Vanc/Cefepime for sepsis as well as fluids. On 4/19, as noted to have LUE numbness that progressed to LUE/LLE weakness with facial droop and dysarthria. CT head was negative for bleed and CTA negative for LVO. CT abdomen showed post op  changes and decrease in fluid along surgical wound.  CT chest was showed right pleural effusion with atelectasis and mild left atelectasis.    On 4/19 neurology consulted due to complaints of numbness to her left arm and dropping things. Left arm weakness along with left leg weakness, mild dysarthria and facial droop. She was not a candidate for TPA and neurology recommended full work up as well as avoidance of hypotension. MRI brain done revealing multiple small infarcts in right corona radiata, subcortical right parietal lobe and left cerebellum with moderate small vessel disease. 2D echo revealed EF 60-65% with negative agitation study and no wall abnormality.  Stroke felt to be embolic and plavix recommended once platelets  50,000.  Hem/Onc was consulted for input due to ongoing further drop in platelets to 21,000 thrombocytopenia and questioned SE of chemo v/s SQ heparin.  HIT panel negative and to monitor for signs of bleeding as well as counts daily for now --to add plavix once plts >50,000.  Blood cultures X 2 and UCS negative therefore antibiotics d/c by 4/22. Mentation and mood improving but patient continues to be limited by left sided weakness and difficulty following multi-step commands.  Complete NIHSS TOTAL: 6  Patient's medical record from [facility]  has been reviewed by the rehabilitation admission coordinator and physician.  Past Medical History  Past Medical History:  Diagnosis Date   CAD (coronary artery disease)    CHF (congestive heart failure) (HCC)    CVA (cerebral infarction)    Female bladder prolapse    GERD (gastroesophageal reflux disease)    Hyperlipidemia    Hypertension    Hypokalemia    Mitral valve disease  [facility]ian [facility])    Sleep apnea    Stroke ([place])    x3    Family History   family history includes CAD in her father; Heart disease in an other family member; Hypertension in an other family member; Rheum arthritis in her  mother.  Prior Rehab/Hospitalizations Has the patient had prior rehab or hospitalizations prior to admission? Yes  First admit [facility] 08/19/2018 readmitted 3 times; [place] [facility] 10/06/2018 and readmit 10/24/2018  Has the patient had major surgery during 100 days prior to admission? Yes   Current Medications  Current Facility-Administered Medications:    0.9 %  sodium chloride infusion, , Intravenous, PRN, Henreitta Leber, MD, Stopped at 11/07/18 0341   acetaminophen (TYLENOL) tablet 650 mg, 650 mg, Oral, Q6H PRN, 650 mg at 11/06/18 1217 **OR** acetaminophen (TYLENOL) suppository 650 mg, 650 mg, Rectal, Q6H PRN, Lance Coon, MD   albuterol (VENTOLIN HFA) 108 (90 Base) MCG/ACT inhaler 2 puff, 2 puff, Inhalation, Q6H PRN, Shanlever, Pierce Crane, RPH   alteplase (CATHFLO ACTIVASE) injection 2 mg, 2 mg, Intracatheter, Once PRN, Sainani, Belia Heman, MD   anticoagulant sodium citrate solution 5 mL, 5 mL, Intracatheter, PRN, Verdell Carmine, Belia Heman, MD   clonazePAM (KLONOPIN) tablet 0.5 mg, 0.5 mg, Oral, QHS PRN, Verdell Carmine, Vivek J, MD, 0.5 mg at 11/06/18 2113   docusate sodium (COLACE) capsule 100 mg, 100 mg, Oral, Daily, Earlie Server, MD, 100 mg at 11/09/18 0915   famotidine (PEPCID) tablet 20 mg, 20 mg, Oral, BID, Sainani, Vivek J, MD, 20 mg at 11/09/18 0915   feeding supplement (BOOST / RESOURCE BREEZE) liquid 1 Container, 1 Container, Oral, TID BM, Gladstone Lighter, MD, 1 Container at 11/09/18 0926   heparin lock flush 100 unit/mL, 250 Units, Intracatheter, PRN, Sainani, Belia Heman, MD   multivitamin-lutein (OCUVITE-LUTEIN) capsule 1 capsule, 1 capsule, Oral, Daily, Verdell Carmine, Vivek J, MD, 1 capsule at 11/09/18 0915   ondansetron (ZOFRAN) tablet 4 mg, 4 mg, Oral, Q6H PRN, 4 mg at 11/03/18 0853 **OR** ondansetron (ZOFRAN) injection 4 mg, 4 mg, Intravenous, Q6H PRN, Lance Coon, MD   oxybutynin (DITROPAN) tablet 5 mg, 5 mg, Oral, BID, Sainani, Vivek J, MD, 5 mg at 11/09/18 0915   PARoxetine (PAXIL) tablet 30  mg, 30 mg, Oral, Daily, Sainani, Vivek J, MD, 30 mg at 11/09/18 0915   potassium chloride SA (K-DUR) CR tablet 40 mEq, 40 mEq, Oral, BID, Sainani, Vivek J, MD, 40 mEq at 11/09/18 0916   pregabalin (LYRICA) capsule 150 mg, 150 mg, Oral, QHS, Sainani, Vivek J, MD, 150 mg at 11/08/18 2145   pregabalin (LYRICA) capsule 75 mg, 75 mg, Oral, BID, Sainani, Vivek J, MD, 75 mg at 11/09/18 0829   rosuvastatin (CRESTOR) tablet 20 mg, 20 mg, Oral, QHS, Sainani, Vivek J, MD, 20 mg at 11/08/18 2146   sodium chloride flush (NS) 0.9 % injection 3 mL, 3 mL, Intravenous, Q12H, Mody, Sital, MD, 3 mL at 11/09/18 5625   spironolactone (ALDACTONE) tablet 12.5 mg, 12.5 mg, Oral, Daily, Sainani, Vivek J, MD, 12.5 mg at 11/09/18 0915   venlafaxine XR (EFFEXOR-XR) 24 hr capsule 75 mg, 75 mg, Oral, Q breakfast, Sainani, Vivek J, MD, 75 mg at 11/09/18 6389  Patients Current Diet:  Diet Order            Diet 2 gram sodium Room service appropriate? Yes with Assist; Fluid consistency: Thin; Fluid restriction: 1200 mL Fluid  Diet effective now  Precautions / Restrictions Precautions Precautions: Fall, Other (comment) Precaution Comments: hemiparesis L arm and leg; abdominal wound; R chest port Restrictions Weight Bearing Restrictions: No   Has the patient had 2 or more falls or a fall with injury in the past year? No  Prior Activity Level Limited Community (1-2x/wk): hospitalized from 08/19/2018 until 1 week pta; Home and then readmittted. Since d/c from SNF, needed min assist with adls and was supervision with rollator. Home for 1 week  Prior Functional Level Self Care: Did the patient need help bathing, dressing, using the toilet or eating? Needed some help  Indoor Mobility: Did the patient need assistance with walking from room to room (with or without device)? Needed some help  Stairs: Did the patient need assistance with internal or external stairs (with or without device)? Needed some  help  Functional Cognition: Did the patient need help planning regular tasks such as shopping or remembering to take medications? Needed some help  Home Assistive Devices / Equipment Home Assistive Devices/Equipment: Bedside commode/3-in-1, Wheelchair Home Equipment: Walker - 4 wheels, Bedside commode, Shower seat, Grab bars - tub/shower(2 canes of unknown type; bedside table)  Prior Device Use: Indicate devices/aids used by the patient prior to current illness, exacerbation or injury? Walker  Current Functional Level Cognition  Arousal/Alertness: Awake/alert Overall Cognitive Status: Within Functional Limits for tasks assessed Orientation Level: Oriented X4 General Comments: Inconsistent with multi-step commands (but improved from previous sessions) Attention: Focused, Sustained Focused Attention: Appears intact Sustained Attention: Appears intact Memory: Appears intact(WFL) Awareness: Appears intact Problem Solving: Appears intact Behaviors: (none) Safety/Judgment: Appears intact(grossly WFL)    Extremity Assessment (includes Sensation/Coordination)  Upper Extremity Assessment: RUE deficits/detail, LUE deficits/detail RUE Deficits / Details: Generalized weakness, shoulder elevation > on R than L LUE Deficits / Details: pt able to involuntarily move LUE briefly, unable to replicate, sustained multi beat clonus at the wrist noted, is able to activate shoulder elevation, impaired sensation throughout LUE LUE Sensation: decreased light touch, decreased proprioception LUE Coordination: decreased gross motor, decreased fine motor  Lower Extremity Assessment: RLE deficits/detail, LLE deficits/detail RLE Deficits / Details: generalized weakness LLE Deficits / Details: trace activation of toe movement. Knee extension 2/5; ankle plantarflexion 1/5. No discernable activation in dorsiflexion. Hip adductors/abductors 1/5.  LLE Sensation: decreased light touch, decreased proprioception LLE  Coordination: decreased fine motor, decreased gross motor    ADLs  Overall ADL's : Needs assistance/impaired Eating/Feeding: Set up, Bed level Grooming: Set up, Sitting, Wash/dry face, Oral care, Moderate assistance, Maximal assistance Grooming Details (indicate cue type and reason): Pt required physical assistance to maintain seated balance t/o seated grooming task EOB. She actively attempted to use her LUE to stabalize items (e.g. toothpaste tube, toothbrush, etc.) t/o session. When given Max/total physical assistance for mgt of LUE, pt was able to use BUE to complete grooming EOB. Pt noted to engage in multiple attempts at use of LUE for application of deodorant on this date. Upper Body Bathing: Sitting, Moderate assistance Lower Body Bathing: Sitting/lateral leans, Maximal assistance, +2 for physical assistance Upper Body Dressing : Sitting, Maximal assistance Lower Body Dressing: Sitting/lateral leans, Maximal assistance, +2 for physical assistance Toilet Transfer Details (indicate cue type and reason): unsafe to attempt  Functional mobility during ADLs: +2 for physical assistance, +2 for safety/equipment, Maximal assistance General ADL Comments: Pt required +2 max assist for functional stand pivot transfer from EOB to room recliner on this date. Pt attempted use of hemi-walker for functional mobility, and required max VC's for technique during  STS transfers.     Mobility  Overal bed mobility: Needs Assistance Bed Mobility: Supine to Sit Supine to sit: Mod assist, Max assist Sit to supine: Min assist, +2 for physical assistance General bed mobility comments: assist for trunk and L UE supine to sit; minimal assist to scoot to edge of bed    Transfers  Overall transfer level: Needs assistance Equipment used: None, Hemi-walker, Left platform walker Transfers: Sit to/from Stand Sit to Stand: Min assist, +2 physical assistance Stand pivot transfers: Mod assist, +2 physical assistance   Lateral/Scoot Transfers: Mod assist, +2 physical assistance General transfer comment: x2 trials standing with no AD (R UE reaching for therapists arm for balance upon standing); x1 trial with R hemi-walker; x2 trials with L platform walker; assist to initiate and come to full stand; vc's for B LE and B UE placement and DME use    Ambulation / Gait / Stairs / Wheelchair Mobility  Ambulation/Gait Ambulation/Gait assistance: Mod assist, +2 physical assistance Gait Distance (Feet): (bed to recliner (about 3 feet sidestepping)) Assistive device: None(R UE holding onto therapists arm for support) General Gait Details: assist to weight shift; assist to block L knee to prevent buckling with movement of R LE; vc's for technique Gait velocity: decreased    Posture / Balance Dynamic Sitting Balance Sitting balance - Comments: pt initially requiring R UE support on bed for static sitting balance (close SBA to CGA for safety) but able to progress to sitting with B UE's in lap static sitting with close SBA to CGA for safety Balance Overall balance assessment: Needs assistance Sitting-balance support: Single extremity supported, Feet supported Sitting balance-Leahy Scale: Poor Sitting balance - Comments: pt initially requiring R UE support on bed for static sitting balance (close SBA to CGA for safety) but able to progress to sitting with B UE's in lap static sitting with close SBA to CGA for safety Postural control: Posterior lean Standing balance-Leahy Scale: Poor Standing balance comment: CGA to min assist x2 with vc's for upright posture required (static standing)    Special needs/care consideration BiPAP/CPAP pta CPM  N/a Continuous Drip IV  N/a; porto cath Dialysis n/a Life Vest  N/a Oxygen  N/a Special Bed  N/a Trach Size  N/a Wound Vac n/a Skin   Jeannie Fend, RN  Registered Nurse  WOC  Consult Note  Signed  Date of Service:  11/02/2018 10:52 AM          Signed          Show:Clear all [x] Manual[x] Template[] Copied  Added by: [x] Jeannie Fend, RN  [] Hover for details WOC Nurse wound consult note Reason for Consult:Midline abdominal wound.  Recent surgery for ovarian cancer and I & D after laparotomy.  Per [facility] notes, HH was applying and changing Wound VAC dressings M/W/F and transitioned to  NS moist gauze .  Will continue this during stay.  Wound type:surgical, infectious Pressure Injury POA: NA Measurement: 18 cm x 1 cm x 0.4 cm  Wound NGE:XBMW pink non granulating.  Drainage (amount, consistency, odor) minimal serosanguinous no odor Peri wound:intact Dressing procedure/placement/frequency:Cleanse abdominal wound with NS and pat dry.  Apply NS moist Kerlix to wound bed.  Cover with ABD pads and tape daily.  Will not follow at this time.  Please re-consult if needed.  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 Per oncology did not tolerate chemotherapy. Will follow up as outpatient   Previous Home Environment  Living Arrangements: Spouse/significant other  Lives With: Spouse Available Help at Discharge: Family, Available 24 hours/day Type of Home: Apartment Home Layout: One level Home Access: Level entry Bathroom Shower/Tub: Tub/shower unit, Architectural technologist: Standard Bathroom Accessibility: Yes How Accessible: Accessible via walker Home Care Services: Yes Type of Home Care Services: Home RN, [place] (if known): [facility] health  Discharge Living Setting Plans for Discharge Living Setting: Patient's home, Lives with (comment), Apartment(spouse) Type of Home at Discharge: Apartment Discharge Home Layout: One level Discharge Home Access: Level  entry Discharge Bathroom Shower/Tub: Tub/shower unit, Curtain Discharge Bathroom Toilet: Standard Discharge Bathroom Accessibility: Yes How Accessible: Accessible via walker Does the patient have any problems obtaining your medications?: Yes (Describe)(uninsured)  Social/Family/Support Systems Patient Roles: Spouse, Parent Contact Information: spouse, Kasandra Knudsen Anticipated Caregiver: spouse Anticipated Caregiver's Contact Information: [phone] Ability/Limitations of Caregiver: no limitations Caregiver Availability: 24/7 Discharge Plan Discussed with Primary Caregiver: Yes Is Caregiver In Agreement with Plan?: Yes Does Caregiver/Family have Issues with Lodging/Transportation while Pt is in Rehab?: No  Goals/Additional Needs Patient/Family Goal for Rehab: supervision PT, supervision to min OT, supervision SLP Expected length of stay: ELOS 10 to 14 days Special Service Needs: New diagnosis Ovarian Cancer Pt/Family Agrees to Admission and willing to participate: Yes Program Orientation Provided & Reviewed with Pt/Caregiver Including Roles  & Responsibilities: Yes  Decrease burden of Care through IP rehab admission: n/a  Possible need for SNF placement upon discharge:  Not anticipated  Patient Condition: I have reviewed medical records from [facility] , spoken with CM, and patient and spouse. I met with patient at the bedside for inpatient rehabilitation assessment.  Patient will benefit from ongoing PT, OT and SLP, can actively participate in 3 hours of therapy a day 5 days of the week, and can make measurable gains during the admission.  Patient will also benefit from the coordinated team approach during an Inpatient Acute Rehabilitation admission.  The patient will receive intensive therapy as well as Rehabilitation physician, nursing, social worker, and care management interventions.  Due to bladder management, bowel management, safety, skin/wound care, disease management,  medication administration and patient education the patient requires 24 hour a day rehabilitation nursing.  The patient is currently mod assist with mobility and basic ADLs.  Discharge setting and therapy post discharge at home with home health is anticipated.  Patient has agreed to participate in the Acute Inpatient Rehabilitation Program and will admit today.  Preadmission Screen Completed By:  Cleatrice Burke RN MSN, 11/09/2018 11:05 AM ______________________________________________________________________   Discussed status with Dr. Naaman Plummer  on  11/09/2018  at  1105 and received approval for admission today.  Admission Coordinator:  Cleatrice Burke, RN, time  6759 Date  11/09/2018   Assessment/Plan: Diagnosis: debility d/t sepsis/PNA 1. Does the need for close, 24 hr/day Medical supervision in concert with the patient's rehab needs make it unreasonable for this patient to be served in a less intensive setting? Yes 2. Co-Morbidities requiring supervision/potential complications: CAD, OSA, depression, cancer 3. Due to bladder management, bowel management, safety, skin/wound care, disease management, medication administration, pain management and patient education, does the patient require 24 hr/day rehab nursing? Yes 4. Does the patient require coordinated care of a physician, rehab nurse, PT (1-2  hrs/day, 5 days/week), OT (1-2 hrs/day, 5 days/week) and SLP (1-2 hrs/day, 5 days/week) to address physical and functional deficits in the context of the above medical diagnosis(es)? Yes Addressing deficits in the following areas: balance, endurance, locomotion, strength, transferring, bowel/bladder control, bathing, dressing, feeding, grooming, cognition, speech and psychosocial support 5. Can the patient actively participate in an intensive therapy program of at least 3 hrs of therapy 5 days a week? Yes 6. The potential for patient to make measurable gains while on inpatient rehab is  excellent 7. Anticipated functional outcomes upon discharge from inpatients are: supervision PT, supervision and min assist OT, supervision SLP 8. Estimated rehab length of stay to reach the above functional goals is: 10-14 days 9. Anticipated D/C setting: Home 10. Anticipated post D/C treatments: [place] therapy 11. Overall Rehab/Functional Prognosis: excellent  MD Signature: [name], MD, [facility] 11/09/2018

## 2018-11-08 NOTE — Progress Notes (Signed)
[facility] NAME: Destiny [name]    MR#:  [id number]  DATE OF BIRTH:  June 02, 1956  SUBJECTIVE:  CHIEF COMPLAINT:   Chief Complaint  Patient presents with  . Blood Infection   -Continues to have left upper extremity weakness.  Speech is normal today. -Platelet count is improving.  Plan to discharge to CIR when bed available  REVIEW OF SYSTEMS:  Review of Systems  Constitutional: Positive for malaise/fatigue. Negative for chills and fever.  HENT: Negative for congestion, ear discharge, hearing loss and nosebleeds.   Respiratory: Negative for cough, shortness of breath and wheezing.   Cardiovascular: Negative for chest pain and palpitations.  Gastrointestinal: Negative for abdominal pain, constipation, diarrhea, nausea and vomiting.  Genitourinary: Negative for dysuria.  Neurological: Positive for focal weakness. Negative for dizziness, seizures and headaches.  Psychiatric/Behavioral: Negative for depression.    DRUG ALLERGIES:   Allergies  Allergen Reactions  . Atorvastatin Hives    No s/sx of anaphylaxis, just intermittent hives.  Does not seem to be a class effect as she has tolerated lovastatin previously No s/sx of anaphylaxis, just intermittent hives.  Does not seem to be a class effect as she has tolerated lovastatin previously  . Fluconazole Rash    Reaction to generic but not name brand Reaction to generic but not name brand CAN USE DIFLUCAN.DOES NOT BREAK OUT WITH DIFLUCAN.(allergic only to generic brand)  . Sertraline Itching  . Sulfa Antibiotics Rash and Other (See Comments)    "hot " sensation  . Sulfamethoxazole-Trimethoprim Rash and Other (See Comments)    "felt hot" Other reaction(s): Other (See Comments) "felt hot"  . Sulfasalazine Other (See Comments) and Rash    "hot " sensation    VITALS:  Blood pressure 111/81, pulse 97, temperature 98.1 F (36.7 C), temperature source Oral, resp. rate 20,  height 5\' 2"  (1.575 m), weight 90.9 kg, SpO2 99 %.  PHYSICAL EXAMINATION:  Physical Exam  GENERAL:  63 y.o.-year-old patient lying in the bed with no acute distress.  EYES: Pupils equal, round, reactive to light and accommodation. No scleral icterus. Extraocular muscles intact.  HEENT: Head atraumatic, normocephalic. Oropharynx and nasopharynx clear.  NECK:  Supple, no jugular venous distention. No thyroid enlargement, no tenderness.  LUNGS: Normal breath sounds bilaterally, no wheezing, rales,rhonchi or crepitation. No use of accessory muscles of respiration.  CARDIOVASCULAR: S1, S2 normal. No murmurs, rubs, or gallops.  ABDOMEN: Soft, nontender, nondistended. Bowel sounds present. No organomegaly or mass.  EXTREMITIES: No pedal edema, cyanosis, or clubbing.  NEUROLOGIC: Cranial nerves II through XII are intact.  Left upper extremity strength is 2/5, motor strength is 5/5 in lower extremities and right upper extremity.  Sensation intact. Gait not checked.  PSYCHIATRIC: The patient is alert and oriented x 3.  SKIN: No obvious rash, lesion, or ulcer.    LABORATORY PANEL:   CBC Recent Labs  Lab 11/08/18 0355  WBC 2.8*  HGB 7.9*  HCT 24.2*  PLT 37*   ------------------------------------------------------------------------------------------------------------------  Chemistries  Recent Labs  Lab 11/01/18 2145  11/07/18 0524 11/08/18 0355  NA 131*   < > 136 138  K 2.1*   < > 3.1* 3.6  CL 94*   < > 104 107  CO2 23   < > 23 23  GLUCOSE 123*   < > 99 97  BUN 26*   < > 10 9  CREATININE 1.06*   < > 0.68 0.53  CALCIUM 8.1*   < >  9.1 9.1  MG 1.6*   < > 2.0  --   AST 24  --   --   --   ALT 27  --   --   --   ALKPHOS 75  --   --   --   BILITOT 1.2  --   --   --    < > = values in this interval not displayed.   ------------------------------------------------------------------------------------------------------------------  Cardiac Enzymes Recent Labs  Lab 11/01/18 2145   TROPONINI <0.03   ------------------------------------------------------------------------------------------------------------------  RADIOLOGY:  No results found.  EKG:   Orders placed or performed during the hospital encounter of 11/01/18  . ED EKG 12-Lead  . ED EKG 12-Lead    ASSESSMENT AND PLAN:   63 year old female with past medical history of ovarian cancer, hypertension, hyperlipidemia, GERD, history of previous CVA, CHF, coronary disease-admitted to hospital for sepsis.  Had an acute CVA during this hospitalization.  1.  Sepsis- present on admission secondary to pneumonia on CT chest. -Resolved now.  Cultures remain negative.    Finished antibiotics.  Hemodynamically stable.  2. Acute CVA- patient developed left-sided facial droop and left upper extremity weakness on 11/04/18.   -  CT head is negative..  Neurology consult obtained.  Patient underwent CTA of the head neck which is negative for large vessel disease. - MRI was positive for right-sided coronary radiata and thalamic small vessel CVA. Given her severe anemia and thrombocytopenia patient currently is not on antiplatelet agents. -Continue PT, speech, echocardiogram done showing no acute evidence of thrombus. -Once platelets are greater than 50 K, will start aspirin  3.  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 -Seen by hematology/oncology and no evidence of DIC or any further etiology of thrombocytopenia. - Continue to follow serial counts.  Once platelet count gets over 50,000 we can initiate antiplatelet therapy for patient's CVA.  4.  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 - Appreciate inpatient hematology input  5.  History of urinary incontinence-continue oxybutynin.  6.  Anxiety-continue Paxil, Klonopin.  7. Hyperlipidemia - cont. Crestor.    8.   DVT prophylaxis-teds and SCDs only given thrombocytopenia   Physical therapy recommended CIR.  Accepted, awaiting bed placement.   All the records are reviewed and case discussed with Care Management/Social Workerr. Management plans discussed with the patient, family and they are in agreement.  CODE STATUS: Full Code  TOTAL TIME TAKING CARE OF THIS PATIENT: 38 minutes.   POSSIBLE D/C IN 2-3 DAYS, DEPENDING ON CLINICAL CONDITION.   Gladstone Lighter M.D on 11/08/2018 at 11:32 AM  Between 7am to 6pm - Pager - [phone]  After 6pm go to [email or web address] - password [email or web address] [facility]  Office  [phone]  CC: Primary care physician; [name], MD

## 2018-11-09 ENCOUNTER — Inpatient Hospital Stay (HOSPITAL_COMMUNITY)
Admission: RE | Admit: 2018-11-09 | Discharge: 2018-11-29 | DRG: 057 | Disposition: A | Discharge status: Home Health | Payer: Medicaid Other | Source: Other Acute Inpatient Hospital | Attending: Physical Medicine & Rehabilitation | Admitting: Physical Medicine & Rehabilitation

## 2018-11-09 ENCOUNTER — Encounter (HOSPITAL_COMMUNITY): Payer: Self-pay

## 2018-11-09 ENCOUNTER — Other Ambulatory Visit: Payer: Self-pay

## 2018-11-09 DIAGNOSIS — I6349 Cerebral infarction due to embolism of other cerebral artery: Secondary | ICD-10-CM

## 2018-11-09 DIAGNOSIS — I5022 Chronic systolic (congestive) heart failure: Secondary | ICD-10-CM | POA: Diagnosis not present

## 2018-11-09 DIAGNOSIS — I69354 Hemiplegia and hemiparesis following cerebral infarction affecting left non-dominant side: Principal | ICD-10-CM

## 2018-11-09 DIAGNOSIS — Z8261 Family history of arthritis: Secondary | ICD-10-CM

## 2018-11-09 DIAGNOSIS — I2510 Atherosclerotic heart disease of native coronary artery without angina pectoris: Secondary | ICD-10-CM | POA: Diagnosis not present

## 2018-11-09 DIAGNOSIS — N3281 Overactive bladder: Secondary | ICD-10-CM | POA: Diagnosis present

## 2018-11-09 DIAGNOSIS — D61818 Other pancytopenia: Secondary | ICD-10-CM | POA: Diagnosis not present

## 2018-11-09 DIAGNOSIS — G4733 Obstructive sleep apnea (adult) (pediatric): Secondary | ICD-10-CM | POA: Diagnosis present

## 2018-11-09 DIAGNOSIS — D62 Acute posthemorrhagic anemia: Secondary | ICD-10-CM | POA: Diagnosis not present

## 2018-11-09 DIAGNOSIS — Z7982 Long term (current) use of aspirin: Secondary | ICD-10-CM

## 2018-11-09 DIAGNOSIS — K219 Gastro-esophageal reflux disease without esophagitis: Secondary | ICD-10-CM | POA: Diagnosis not present

## 2018-11-09 DIAGNOSIS — Z882 Allergy status to sulfonamides status: Secondary | ICD-10-CM

## 2018-11-09 DIAGNOSIS — D696 Thrombocytopenia, unspecified: Secondary | ICD-10-CM | POA: Diagnosis present

## 2018-11-09 DIAGNOSIS — Z9049 Acquired absence of other specified parts of digestive tract: Secondary | ICD-10-CM

## 2018-11-09 DIAGNOSIS — E46 Unspecified protein-calorie malnutrition: Secondary | ICD-10-CM | POA: Diagnosis present

## 2018-11-09 DIAGNOSIS — Z8544 Personal history of malignant neoplasm of other female genital organs: Secondary | ICD-10-CM | POA: Diagnosis not present

## 2018-11-09 DIAGNOSIS — Z79890 Hormone replacement therapy (postmenopausal): Secondary | ICD-10-CM | POA: Diagnosis not present

## 2018-11-09 DIAGNOSIS — I110 Hypertensive heart disease with heart failure: Secondary | ICD-10-CM | POA: Diagnosis not present

## 2018-11-09 DIAGNOSIS — F329 Major depressive disorder, single episode, unspecified: Secondary | ICD-10-CM | POA: Diagnosis present

## 2018-11-09 DIAGNOSIS — I10 Essential (primary) hypertension: Secondary | ICD-10-CM | POA: Diagnosis present

## 2018-11-09 DIAGNOSIS — Z79899 Other long term (current) drug therapy: Secondary | ICD-10-CM | POA: Diagnosis not present

## 2018-11-09 DIAGNOSIS — E876 Hypokalemia: Secondary | ICD-10-CM | POA: Diagnosis present

## 2018-11-09 DIAGNOSIS — I639 Cerebral infarction, unspecified: Secondary | ICD-10-CM | POA: Diagnosis present

## 2018-11-09 DIAGNOSIS — I739 Peripheral vascular disease, unspecified: Secondary | ICD-10-CM | POA: Diagnosis present

## 2018-11-09 DIAGNOSIS — Z9221 Personal history of antineoplastic chemotherapy: Secondary | ICD-10-CM | POA: Diagnosis not present

## 2018-11-09 DIAGNOSIS — Z8543 Personal history of malignant neoplasm of ovary: Secondary | ICD-10-CM

## 2018-11-09 DIAGNOSIS — Z7951 Long term (current) use of inhaled steroids: Secondary | ICD-10-CM

## 2018-11-09 DIAGNOSIS — E785 Hyperlipidemia, unspecified: Secondary | ICD-10-CM | POA: Diagnosis present

## 2018-11-09 DIAGNOSIS — F419 Anxiety disorder, unspecified: Secondary | ICD-10-CM | POA: Diagnosis not present

## 2018-11-09 DIAGNOSIS — Z6836 Body mass index (BMI) 36.0-36.9, adult: Secondary | ICD-10-CM

## 2018-11-09 DIAGNOSIS — I69392 Facial weakness following cerebral infarction: Secondary | ICD-10-CM | POA: Diagnosis not present

## 2018-11-09 DIAGNOSIS — Z8249 Family history of ischemic heart disease and other diseases of the circulatory system: Secondary | ICD-10-CM

## 2018-11-09 DIAGNOSIS — K5900 Constipation, unspecified: Secondary | ICD-10-CM

## 2018-11-09 DIAGNOSIS — D72819 Decreased white blood cell count, unspecified: Secondary | ICD-10-CM

## 2018-11-09 DIAGNOSIS — Z888 Allergy status to other drugs, medicaments and biological substances status: Secondary | ICD-10-CM

## 2018-11-09 DIAGNOSIS — I059 Rheumatic mitral valve disease, unspecified: Secondary | ICD-10-CM | POA: Diagnosis present

## 2018-11-09 DIAGNOSIS — C5700 Malignant neoplasm of unspecified fallopian tube: Secondary | ICD-10-CM

## 2018-11-09 LAB — CBC
HCT: 25 % — ABNORMAL LOW (ref 36.0–46.0)
Hemoglobin: 8.3 g/dL — ABNORMAL LOW (ref 12.0–15.0)
MCH: 29.9 pg (ref 26.0–34.0)
MCHC: 33.2 g/dL (ref 30.0–36.0)
MCV: 89.9 fL (ref 80.0–100.0)
Platelets: 61 10*3/uL — ABNORMAL LOW (ref 150–400)
RBC: 2.78 MIL/uL — ABNORMAL LOW (ref 3.87–5.11)
RDW: 14.4 % (ref 11.5–15.5)
WBC: 3.5 10*3/uL — ABNORMAL LOW (ref 4.0–10.5)
nRBC: 0 % (ref 0.0–0.2)

## 2018-11-09 LAB — BASIC METABOLIC PANEL
Anion gap: 7 (ref 5–15)
BUN: 9 mg/dL (ref 8–23)
CO2: 23 mmol/L (ref 22–32)
Calcium: 9.4 mg/dL (ref 8.9–10.3)
Chloride: 107 mmol/L (ref 98–111)
Creatinine, Ser: 0.46 mg/dL (ref 0.44–1.00)
GFR calc Af Amer: >60 mL/min (ref >60)
GFR calc non Af Amer: >60 mL/min (ref >60)
Glucose, Bld: 98 mg/dL (ref 70–99)
Potassium: 4.2 mmol/L (ref 3.5–5.1)
Sodium: 137 mmol/L (ref 135–145)

## 2018-11-09 MED ORDER — DIPHENHYDRAMINE HCL 12.5 MG/5ML PO ELIX: 12.5000 mg | ORAL_SOLUTION | Freq: Four times a day (QID) | ORAL | Status: DC | PRN

## 2018-11-09 MED ORDER — SPIRONOLACTONE 12.5 MG HALF TABLET
12.5000 mg | ORAL_TABLET | Freq: Every day | ORAL | Status: DC
  Administered 2018-11-10 – 2018-11-29 (×20): 12.5 mg via ORAL
  Filled 2018-11-09 (×20): qty 1

## 2018-11-09 MED ORDER — ALTEPLASE 2 MG IJ SOLR: 2.0000 mg | Freq: Once | INTRAMUSCULAR | Status: DC | PRN

## 2018-11-09 MED ORDER — BOOST / RESOURCE BREEZE PO LIQD CUSTOM
1.0000 | Freq: Three times a day (TID) | ORAL | Status: DC
  Administered 2018-11-09: 1 via ORAL
  Administered 2018-11-10: 21:00:00 via ORAL
  Administered 2018-11-10 – 2018-11-23 (×34): 1 via ORAL

## 2018-11-09 MED ORDER — PROCHLORPERAZINE EDISYLATE 10 MG/2ML IJ SOLN: 5.0000 mg | Freq: Four times a day (QID) | INTRAMUSCULAR | Status: DC | PRN

## 2018-11-09 MED ORDER — OCUVITE-LUTEIN PO CAPS: 1.0000 | ORAL_CAPSULE | Freq: Every day | ORAL | 0 refills | Status: DC

## 2018-11-09 MED ORDER — PROSIGHT PO TABS
1.0000 | ORAL_TABLET | Freq: Every day | ORAL | Status: DC
  Administered 2018-11-10 – 2018-11-29 (×20): 1 via ORAL
  Filled 2018-11-09 (×20): qty 1

## 2018-11-09 MED ORDER — ASPIRIN 81 MG PO CHEW
81.0000 mg | CHEWABLE_TABLET | Freq: Every day | ORAL | Status: DC
  Administered 2018-11-10 – 2018-11-29 (×20): 81 mg via ORAL
  Filled 2018-11-09 (×20): qty 1

## 2018-11-09 MED ORDER — PREGABALIN 75 MG PO CAPS
75.0000 mg | ORAL_CAPSULE | Freq: Two times a day (BID) | ORAL | Status: DC
  Administered 2018-11-10 – 2018-11-29 (×39): 75 mg via ORAL
  Filled 2018-11-09 (×40): qty 1

## 2018-11-09 MED ORDER — ACETAMINOPHEN 325 MG PO TABS: 325.0000 mg | ORAL_TABLET | ORAL | Status: DC | PRN

## 2018-11-09 MED ORDER — FLEET ENEMA 7-19 GM/118ML RE ENEM
1.0000 | ENEMA | Freq: Once | RECTAL | Status: AC | PRN
  Administered 2018-11-27: 1 via RECTAL
  Filled 2018-11-09: qty 1

## 2018-11-09 MED ORDER — TRAZODONE HCL 50 MG PO TABS: 25.0000 mg | ORAL_TABLET | Freq: Every evening | ORAL | Status: DC | PRN

## 2018-11-09 MED ORDER — BISACODYL 10 MG RE SUPP
10.0000 mg | Freq: Every day | RECTAL | Status: DC | PRN
  Administered 2018-11-22: 22:00:00 10 mg via RECTAL
  Filled 2018-11-09: qty 1

## 2018-11-09 MED ORDER — OXYBUTYNIN CHLORIDE 5 MG PO TABS
5.0000 mg | ORAL_TABLET | Freq: Two times a day (BID) | ORAL | Status: DC
  Administered 2018-11-09 – 2018-11-29 (×40): 5 mg via ORAL
  Filled 2018-11-09 (×40): qty 1

## 2018-11-09 MED ORDER — FAMOTIDINE 20 MG PO TABS
20.0000 mg | ORAL_TABLET | Freq: Two times a day (BID) | ORAL | Status: DC
  Administered 2018-11-09 – 2018-11-29 (×40): 20 mg via ORAL
  Filled 2018-11-09 (×40): qty 1

## 2018-11-09 MED ORDER — DOCUSATE SODIUM 100 MG PO CAPS
100.0000 mg | ORAL_CAPSULE | Freq: Every day | ORAL | Status: DC
  Administered 2018-11-10 – 2018-11-29 (×20): 100 mg via ORAL
  Filled 2018-11-09 (×20): qty 1

## 2018-11-09 MED ORDER — ANTICOAGULANT SODIUM CITRATE 4% (200MG/5ML) IV SOLN
5.0000 mL | Status: DC | PRN
  Filled 2018-11-09: qty 5

## 2018-11-09 MED ORDER — CLONAZEPAM 0.5 MG PO TABS: 0.5000 mg | ORAL_TABLET | Freq: Every evening | ORAL | Status: DC | PRN

## 2018-11-09 MED ORDER — ROSUVASTATIN CALCIUM 20 MG PO TABS
20.0000 mg | ORAL_TABLET | Freq: Every day | ORAL | Status: DC
  Administered 2018-11-09 – 2018-11-28 (×20): 20 mg via ORAL
  Filled 2018-11-09 (×20): qty 1

## 2018-11-09 MED ORDER — POTASSIUM CHLORIDE CRYS ER 20 MEQ PO TBCR
40.0000 meq | EXTENDED_RELEASE_TABLET | Freq: Two times a day (BID) | ORAL | Status: DC
  Administered 2018-11-09 – 2018-11-28 (×38): 40 meq via ORAL
  Filled 2018-11-09 (×38): qty 2

## 2018-11-09 MED ORDER — VENLAFAXINE HCL ER 75 MG PO CP24
75.0000 mg | ORAL_CAPSULE | Freq: Every day | ORAL | Status: DC
  Administered 2018-11-10 – 2018-11-29 (×20): 75 mg via ORAL
  Filled 2018-11-09 (×21): qty 1

## 2018-11-09 MED ORDER — ALBUTEROL SULFATE (2.5 MG/3ML) 0.083% IN NEBU: 2.5000 mg | INHALATION_SOLUTION | Freq: Four times a day (QID) | RESPIRATORY_TRACT | Status: DC | PRN

## 2018-11-09 MED ORDER — PAROXETINE HCL 30 MG PO TABS
30.0000 mg | ORAL_TABLET | Freq: Every day | ORAL | Status: DC
  Administered 2018-11-10 – 2018-11-29 (×20): 30 mg via ORAL
  Filled 2018-11-09 (×20): qty 1

## 2018-11-09 MED ORDER — GUAIFENESIN-DM 100-10 MG/5ML PO SYRP: 5.0000 mL | ORAL_SOLUTION | Freq: Four times a day (QID) | ORAL | Status: DC | PRN

## 2018-11-09 MED ORDER — ALUM & MAG HYDROXIDE-SIMETH 200-200-20 MG/5ML PO SUSP: 30.0000 mL | ORAL | Status: DC | PRN

## 2018-11-09 MED ORDER — HEPARIN SOD (PORK) LOCK FLUSH 100 UNIT/ML IV SOLN
500.0000 [IU] | Freq: Once | INTRAVENOUS | Status: AC
  Administered 2018-11-09: 500 [IU] via INTRAVENOUS
  Filled 2018-11-09: qty 5

## 2018-11-09 MED ORDER — PREGABALIN 75 MG PO CAPS
150.0000 mg | ORAL_CAPSULE | Freq: Every day | ORAL | Status: DC
  Administered 2018-11-09 – 2018-11-28 (×20): 150 mg via ORAL
  Filled 2018-11-09 (×20): qty 2

## 2018-11-09 MED ORDER — PROCHLORPERAZINE 25 MG RE SUPP: 12.5000 mg | Freq: Four times a day (QID) | RECTAL | Status: DC | PRN

## 2018-11-09 MED ORDER — POLYETHYLENE GLYCOL 3350 17 G PO PACK
17.0000 g | PACK | Freq: Every day | ORAL | Status: DC | PRN
  Administered 2018-11-11: 11:00:00 17 g via ORAL
  Filled 2018-11-09 (×2): qty 1

## 2018-11-09 MED ORDER — PROCHLORPERAZINE MALEATE 5 MG PO TABS: 5.0000 mg | ORAL_TABLET | Freq: Four times a day (QID) | ORAL | Status: DC | PRN

## 2018-11-09 NOTE — Progress Notes (Signed)
SLP Cancellation Note  Patient Details Name: Destiny [name] MRN: [id number] DOB: 1955-09-30   Cancelled treatment:       Reason Eval/Treat Not Completed: SLP screened, no needs identified, will sign off Patient continues to demonstrate mild left facial droop with no significant impact on speech intelligibility.  Patient is scheduled to transfer to CIR today.  Leroy Sea, MS/CCC- SLP  Valetta Fuller, Daine Floras 11/09/2018, 11:03 AM

## 2018-11-09 NOTE — Progress Notes (Signed)
[name], MD  Physician  Physical Medicine and Rehabilitation  PMR Pre-admission  Signed  Date of Service:  11/08/2018 5:31 PM       Related encounter: ED to Hosp-Admission (Current) from 11/01/2018 in [place] (1C)      Signed         Show:Clear all _0 Manual_1 Template_2 Copied  Added by: _3 Cristina Gong, RN_4 [name], MD  _5 Hover for details PMR Admission Coordinator Pre-Admission Assessment  Patient: Destiny [name] is an 63 y.o., female MRN: [id number] DOB: Oct 16, 1955 Height: _6  (157.5 cm) Weight: 90.9 kg  Insurance Information  PRIMARY: uninsured      Medicaid application 0/01/3709. Case worker is Wilburn Cornelia phone [phone] Disability application begun  Spouse reports financial counselor at [employer] phone [phone] Disability phone [phone]  Medicaid Application Date: 01/23/9380      Case Manager: Elie Confer Revels  Disability Application Date: unknown      Case Worker: [email or web address]  The Data Collection Information Summary for patients in Inpatient Rehabilitation Facilities with attached Privacy Act [place] Records was provided and verbally reviewed with: N/A  Emergency Contact Information         Contact Information    Name Relation Home Work Mobile   [place] [phone]  [phone]   Niza, [place]   [phone]      Current Medical History  Patient Admitting Diagnosis: Sepsis secondary to PNA; acute CVA  History of Present Illness: 63 year old female with history of CAD, MVP, OSA, prior strokes, malnutrition, depression, 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 She was admitted to [facility] 4/16 with reports of somnolence most of the day. She was found to be hypotensive, dehydrated, significantly hypokalemic with K+ 2.1 and was difficult to arouse. She was started on IV Vanc/Cefepime for sepsis as well as fluids. On 4/19, as noted to have LUE numbness that progressed to LUE/LLE weakness with facial droop and dysarthria. CT head was negative for bleed and CTA negative for LVO. CT abdomen showed post op changes and decrease in fluid along surgical wound. CT chest was showed right pleural effusion with atelectasis and mild left atelectasis.   On 4/19 neurology consulted due to complaints of numbness to her left arm and dropping things. Left arm weakness along with left leg weakness, mild dysarthria and facial droop. She was not a candidate for TPA and neurology recommended full work up as well as avoidance of hypotension. MRI brain done revealing multiple small infarcts in right corona radiata, subcortical right parietal lobe and left cerebellum with moderate small vessel disease. 2D echo revealed EF 60-65% with negative agitation study and no wall abnormality. Stroke felt to be embolic and plavix recommended once platelets  50,000. Hem/Onc was consulted for input due to ongoing further drop in platelets to 21,000 thrombocytopenia and questioned SE of chemo v/s SQ heparin. HIT panel negative and to monitor for signs of bleeding as well as counts daily for now --to add plavix once plts >50,000. Blood cultures X 2 and UCS negative therefore antibiotics d/c by 4/22. Mentation and mood improving but patient continues to be limited by left sided weakness and difficulty following multi-step commands.  Complete NIHSS TOTAL: 6  Patient's medical record from [facility]  has been reviewed by the rehabilitation admission coordinator and physician.  Past Medical History      Past Medical History:  Diagnosis Date   CAD (coronary artery  disease)    CHF (congestive heart failure) (HCC)    CVA (cerebral infarction)    Female bladder prolapse    GERD (gastroesophageal reflux disease)    Hyperlipidemia    Hypertension    Hypokalemia    Mitral valve disease    Ovarian ca ([place])    Sleep apnea    Stroke ([place])    x3    Family History   family history includes CAD in her father; Heart disease in an other family member; Hypertension in an other family member; Rheum arthritis in her mother.  Prior Rehab/Hospitalizations Has the patient had prior rehab or hospitalizations prior to admission? Yes  First admit [facility] 08/19/2018 readmitted 3 times; [facility] 10/06/2018 and readmit 10/24/2018  Has the patient had major surgery during 100 days prior to admission? Yes             Current Medications  Current Facility-Administered Medications:    0.9 %  sodium chloride infusion, , Intravenous, PRN, Henreitta Leber, MD, Stopped at 11/07/18 0341   acetaminophen (TYLENOL) tablet 650 mg, 650 mg, Oral, Q6H PRN, 650 mg at 11/06/18 1217 **OR** acetaminophen (TYLENOL) suppository 650 mg, 650 mg, Rectal, Q6H PRN, Lance Coon, MD   albuterol (VENTOLIN HFA) 108 (90 Base) MCG/ACT inhaler 2 puff, 2 puff, Inhalation, Q6H PRN, Shanlever, Pierce Crane, RPH   alteplase (CATHFLO ACTIVASE) injection 2 mg, 2 mg, Intracatheter, Once PRN, Sainani, Belia Heman, MD   anticoagulant sodium citrate solution 5 mL, 5 mL, Intracatheter, PRN, Verdell Carmine, Belia Heman, MD   clonazePAM (KLONOPIN) tablet 0.5 mg, 0.5 mg, Oral, QHS PRN, Verdell Carmine, Vivek J, MD, 0.5 mg at 11/06/18 2113   docusate sodium (COLACE) capsule 100 mg, 100 mg, Oral, Daily, Earlie Server, MD, 100 mg at 11/09/18 0915   famotidine (PEPCID) tablet 20 mg, 20 mg, Oral, BID, Sainani, Vivek J, MD, 20 mg at 11/09/18 0915   feeding supplement (BOOST / RESOURCE BREEZE) liquid 1 Container, 1 Container, Oral, TID BM, Gladstone Lighter, MD, 1 Container at 11/09/18 0926   heparin lock flush  100 unit/mL, 250 Units, Intracatheter, PRN, Sainani, Belia Heman, MD   multivitamin-lutein (OCUVITE-LUTEIN) capsule 1 capsule, 1 capsule, Oral, Daily, Verdell Carmine, Vivek J, MD, 1 capsule at 11/09/18 0915   ondansetron (ZOFRAN) tablet 4 mg, 4 mg, Oral, Q6H PRN, 4 mg at 11/03/18 0853 **OR** ondansetron (ZOFRAN) injection 4 mg, 4 mg, Intravenous, Q6H PRN, Lance Coon, MD   oxybutynin (DITROPAN) tablet 5 mg, 5 mg, Oral, BID, Sainani, Vivek J, MD, 5 mg at 11/09/18 0915   PARoxetine (PAXIL) tablet 30 mg, 30 mg, Oral, Daily, Sainani, Vivek J, MD, 30 mg at 11/09/18 0915   potassium chloride SA (K-DUR) CR tablet 40 mEq, 40 mEq, Oral, BID, Henreitta Leber, MD, 40 mEq at 11/09/18 0916   pregabalin (LYRICA) capsule 150 mg, 150 mg, Oral, QHS, Sainani, Vivek J, MD, 150 mg at 11/08/18 2145   pregabalin (LYRICA) capsule 75 mg, 75 mg, Oral, BID, Sainani, Vivek J, MD, 75 mg at 11/09/18 0829   rosuvastatin (CRESTOR) tablet 20 mg, 20 mg, Oral, QHS, Sainani, Vivek J, MD, 20 mg at 11/08/18 2146   sodium chloride flush (NS) 0.9 % injection 3 mL, 3 mL, Intravenous, Q12H, Mody, Sital, MD, 3 mL at 11/09/18 0926   spironolactone (ALDACTONE)  tablet 12.5 mg, 12.5 mg, Oral, Daily, Sainani, Vivek J, MD, 12.5 mg at 11/09/18 0915   venlafaxine XR (EFFEXOR-XR) 24 hr capsule 75 mg, 75 mg, Oral, Q breakfast, Sainani, Vivek J, MD, 75 mg at 11/09/18 2957  Patients Current Diet:     Diet Order                  Diet 2 gram sodium Room service appropriate? Yes with Assist; Fluid consistency: Thin; Fluid restriction: 1200 mL Fluid  Diet effective now               Precautions / Restrictions Precautions Precautions: Fall, Other (comment) Precaution Comments: hemiparesis L arm and leg; abdominal wound; R chest port Restrictions Weight Bearing Restrictions: No   Has the patient had 2 or more falls or a fall with injury in the past year? No  Prior Activity Level Limited Community (1-2x/wk): hospitalized from  08/19/2018 until 1 week pta; Home and then readmittted. Since d/c from SNF, needed min assist with adls and was supervision with rollator. Home for 1 week  Prior Functional Level Self Care: Did the patient need help bathing, dressing, using the toilet or eating? Needed some help  Indoor Mobility: Did the patient need assistance with walking from room to room (with or without device)? Needed some help  Stairs: Did the patient need assistance with internal or external stairs (with or without device)? Needed some help  Functional Cognition: Did the patient need help planning regular tasks such as shopping or remembering to take medications? Needed some help  Home Assistive Devices / Equipment Home Assistive Devices/Equipment: Bedside commode/3-in-1, Wheelchair Home Equipment: Walker - 4 wheels, Bedside commode, Shower seat, Grab bars - tub/shower(2 canes of unknown type; bedside table)  Prior Device Use: Indicate devices/aids used by the patient prior to current illness, exacerbation or injury? Walker  Current Functional Level Cognition  Arousal/Alertness: Awake/alert Overall Cognitive Status: Within Functional Limits for tasks assessed Orientation Level: Oriented X4 General Comments: Inconsistent with multi-step commands (but improved from previous sessions) Attention: Focused, Sustained Focused Attention: Appears intact Sustained Attention: Appears intact Memory: Appears intact(WFL) Awareness: Appears intact Problem Solving: Appears intact Behaviors: (none) Safety/Judgment: Appears intact(grossly WFL)    Extremity Assessment (includes Sensation/Coordination)  Upper Extremity Assessment: RUE deficits/detail, LUE deficits/detail RUE Deficits / Details: Generalized weakness, shoulder elevation > on R than L LUE Deficits / Details: pt able to involuntarily move LUE briefly, unable to replicate, sustained multi beat clonus at the wrist noted, is able to activate shoulder  elevation, impaired sensation throughout LUE LUE Sensation: decreased light touch, decreased proprioception LUE Coordination: decreased gross motor, decreased fine motor  Lower Extremity Assessment: RLE deficits/detail, LLE deficits/detail RLE Deficits / Details: generalized weakness LLE Deficits / Details: trace activation of toe movement. Knee extension 2/5; ankle plantarflexion 1/5. No discernable activation in dorsiflexion. Hip adductors/abductors 1/5.  LLE Sensation: decreased light touch, decreased proprioception LLE Coordination: decreased fine motor, decreased gross motor    ADLs  Overall ADL's : Needs assistance/impaired Eating/Feeding: Set up, Bed level Grooming: Set up, Sitting, Wash/dry face, Oral care, Moderate assistance, Maximal assistance Grooming Details (indicate cue type and reason): Pt required physical assistance to maintain seated balance t/o seated grooming task EOB. She actively attempted to use her LUE to stabalize items (e.g. toothpaste tube, toothbrush, etc.) t/o session. When given Max/total physical assistance for mgt of LUE, pt was able to use BUE to complete grooming EOB. Pt noted to engage in multiple attempts at use of LUE  for application of deodorant on this date. Upper Body Bathing: Sitting, Moderate assistance Lower Body Bathing: Sitting/lateral leans, Maximal assistance, +2 for physical assistance Upper Body Dressing : Sitting, Maximal assistance Lower Body Dressing: Sitting/lateral leans, Maximal assistance, +2 for physical assistance Toilet Transfer Details (indicate cue type and reason): unsafe to attempt  Functional mobility during ADLs: +2 for physical assistance, +2 for safety/equipment, Maximal assistance General ADL Comments: Pt required +2 max assist for functional stand pivot transfer from EOB to room recliner on this date. Pt attempted use of hemi-walker for functional mobility, and required max VC's for technique during STS transfers.       Mobility  Overal bed mobility: Needs Assistance Bed Mobility: Supine to Sit Supine to sit: Mod assist, Max assist Sit to supine: Min assist, +2 for physical assistance General bed mobility comments: assist for trunk and L UE supine to sit; minimal assist to scoot to edge of bed    Transfers  Overall transfer level: Needs assistance Equipment used: None, Hemi-walker, Left platform walker Transfers: Sit to/from Stand Sit to Stand: Min assist, +2 physical assistance Stand pivot transfers: Mod assist, +2 physical assistance  Lateral/Scoot Transfers: Mod assist, +2 physical assistance General transfer comment: x2 trials standing with no AD (R UE reaching for therapists arm for balance upon standing); x1 trial with R hemi-walker; x2 trials with L platform walker; assist to initiate and come to full stand; vc's for B LE and B UE placement and DME use    Ambulation / Gait / Stairs / Wheelchair Mobility  Ambulation/Gait Ambulation/Gait assistance: Mod assist, +2 physical assistance Gait Distance (Feet): (bed to recliner (about 3 feet sidestepping)) Assistive device: None(R UE holding onto therapists arm for support) General Gait Details: assist to weight shift; assist to block L knee to prevent buckling with movement of R LE; vc's for technique Gait velocity: decreased    Posture / Balance Dynamic Sitting Balance Sitting balance - Comments: pt initially requiring R UE support on bed for static sitting balance (close SBA to CGA for safety) but able to progress to sitting with B UE's in lap static sitting with close SBA to CGA for safety Balance Overall balance assessment: Needs assistance Sitting-balance support: Single extremity supported, Feet supported Sitting balance-Leahy Scale: Poor Sitting balance - Comments: pt initially requiring R UE support on bed for static sitting balance (close SBA to CGA for safety) but able to progress to sitting with B UE's in lap static sitting with  close SBA to CGA for safety Postural control: Posterior lean Standing balance-Leahy Scale: Poor Standing balance comment: CGA to min assist x2 with vc's for upright posture required (static standing)    Special needs/care consideration BiPAP/CPAP pta CPM  N/a Continuous Drip IV  N/a; porto cath Dialysis n/a Life Vest  N/a Oxygen  N/a Special Bed  N/a Trach Size  N/a Wound Vac n/a Skin      Jeannie Fend, RN  Registered Nurse  WOC  Consult Note   Signed   Date of Service:  11/02/2018 10:52 AM            Signed         Show:Clear all _0 ?Manual_1 ?Template_2 ?Copied  Added by: _3 ?Jeannie Fend, RN  _4 ?Hover for details [place] Nurse wound consult note Reason for Consult:Midline abdominal wound. Recent surgery for ovarian cancer and I &D after laparotomy. Per [facility] notes, HH was applying and changing Wound VAC dressings M/W/F and transitioned to NS moist gauze . Will continue this during  stay.  Wound type:surgical, infectious Pressure Injury POA: NA Measurement:18 cm x 1 cm x 0.4 cm Wound LKJ:ZPHX pink non granulating. Drainage (amount, consistency, odor)minimal serosanguinous no odor Peri wound:intact Dressing procedure/placement/frequency:Cleanse abdominal wound with NS and pat dry. Apply NS moist Kerlix to wound bed. Cover with ABD pads and tape daily.  Will not follow at this time. Please re-consult if needed.  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 Per oncology did not tolerate chemotherapy. Will follow up as outpatient   Previous Home Environment  Living Arrangements: Spouse/significant other  Lives With: Spouse Available Help at Discharge: Family, Available 24  hours/day Type of Home: Apartment Home Layout: One level Home Access: Level entry Bathroom Shower/Tub: Tub/shower unit, Architectural technologist: Standard Bathroom Accessibility: Yes How Accessible: Accessible via walker Home Care Services: Yes Type of Home Care Services: Home RN, [place] (if known): [facility] health  Discharge Living Setting Plans for Discharge Living Setting: Patient's home, Lives with (comment), Apartment(spouse) Type of Home at Discharge: Apartment Discharge Home Layout: One level Discharge Home Access: Level entry Discharge Bathroom Shower/Tub: Tub/shower unit, Curtain Discharge Bathroom Toilet: Standard Discharge Bathroom Accessibility: Yes How Accessible: Accessible via walker Does the patient have any problems obtaining your medications?: Yes (Describe)(uninsured)  Social/Family/Support Systems Patient Roles: Spouse, Parent Contact Information: spouse, Kasandra Knudsen Anticipated Caregiver: spouse Anticipated Caregiver's Contact Information: [phone] Ability/Limitations of Caregiver: no limitations Caregiver Availability: 24/7 Discharge Plan Discussed with Primary Caregiver: Yes Is Caregiver In Agreement with Plan?: Yes Does Caregiver/Family have Issues with Lodging/Transportation while Pt is in Rehab?: No  Goals/Additional Needs Patient/Family Goal for Rehab: supervision PT, supervision to min OT, supervision SLP Expected length of stay: ELOS 10 to 14 days Special Service Needs: New diagnosis Ovarian Cancer Pt/Family Agrees to Admission and willing to participate: Yes Program Orientation Provided & Reviewed with Pt/Caregiver Including Roles  & Responsibilities: Yes  Decrease burden of Care through IP rehab admission: n/a  Possible need for SNF placement upon discharge:  Not anticipated  Patient Condition: I have reviewed medical [facility] , spoken with CM, and patient and spouse. I met with patient at the  bedside for inpatient rehabilitation assessment.  Patient will benefit from ongoing PT, OT and SLP, can actively participate in 3 hours of therapy a day 5 days of the week, and can make measurable gains during the admission.  Patient will also benefit from the coordinated team approach during an Inpatient Acute Rehabilitation admission.  The patient will receive intensive therapy as well as Rehabilitation physician, nursing, social worker, and care management interventions.  Due to bladder management, bowel management, safety, skin/wound care, disease management, medication administration and patient education the patient requires 24 hour a day rehabilitation nursing.  The patient is currently mod assist with mobility and basic ADLs.  Discharge setting and therapy post discharge at home with home health is anticipated.  Patient has agreed to participate in the Acute Inpatient Rehabilitation Program and will admit today.  Preadmission Screen Completed By:  Cleatrice Burke RN MSN, 11/09/2018 11:05 AM ______________________________________________________________________   Discussed status with Dr. Naaman Plummer  on  11/09/2018  at  1105 and received approval for admission today.  Admission Coordinator:  Julious Payer,  Audelia Acton, RN, time  1105 Date  11/09/2018   Assessment/Plan: Diagnosis: debility d/t sepsis/PNA 1. Does the need for close, 24 hr/day Medical supervision in concert with the patient's rehab needs make it unreasonable for this patient to be served in a less intensive setting? Yes 2. Co-Morbidities requiring supervision/potential complications: CAD, OSA, depression, cancer 3. Due to bladder management, bowel management, safety, skin/wound care, disease management, medication administration, pain management and patient education, does the patient require 24 hr/day rehab nursing? Yes 4. Does the patient require coordinated care of a physician, rehab nurse, PT (1-2 hrs/day, 5 days/week), OT (1-2  hrs/day, 5 days/week) and SLP (1-2 hrs/day, 5 days/week) to address physical and functional deficits in the context of the above medical diagnosis(es)? Yes Addressing deficits in the following areas: balance, endurance, locomotion, strength, transferring, bowel/bladder control, bathing, dressing, feeding, grooming, cognition, speech and psychosocial support 5. Can the patient actively participate in an intensive therapy program of at least 3 hrs of therapy 5 days a week? Yes 6. The potential for patient to make measurable gains while on inpatient rehab is excellent 7. Anticipated functional outcomes upon discharge from inpatients are: supervision PT, supervision and min assist OT, supervision SLP 8. Estimated rehab length of stay to reach the above functional goals is: 10-14 days 9. Anticipated D/C setting: Home 10. Anticipated post D/C treatments: [place] therapy 11. Overall Rehab/Functional Prognosis: excellent  MD Signature: [name], MD, [place] [facility] 11/09/2018         Revision History

## 2018-11-09 NOTE — Progress Notes (Signed)
Report given to Heath with Dagmar Chafe and [place], RN at inpatient rehab. [facility] deaccessed and per [facility] IV to remain in for transport to IPR at [facility]. Patient made aware and updated.

## 2018-11-09 NOTE — TOC Transition Note (Signed)
Transition of Care [facility]) - CM/SW Discharge Note   Patient Details  Name: Destiny [name] MRN: [id number] Date of Birth: Dec 31, 1955  Transition [facility]) CM/SW Contact:  Su Hilt, RN Phone Number: 11/09/2018, 11:03 AM   Clinical Narrative:     Barbara from [facility] called with the bed room [place] Dr Naaman Plummer accepting the patient, the husband has been made aware and she will transport via [facility] at 1 PM, the nurse from [facility] is to call for report but if not called by 1230 Bedside nurse can call [phone] and give report [facility] called to arrange transport at 1 by the secretary   Final next level of care: IP Rehab Facility(CIR room [address] 08) Barriers to Discharge: Barriers Resolved   Patient Goals and CMS Choice Patient states their goals for this hospitalization and ongoing recovery are:: go home CMS Medicare.gov Compare Post Acute Care list provided to:: Patient Choice offered to / list presented to : Spouse  Discharge Placement                       Discharge Plan and Services   Discharge Planning Services: CM Consult Post Acute Care Choice: Home Health            DME Agency: (home health already in place)                  Social Determinants of Health (SDOH) Interventions     Readmission Risk Interventions No flowsheet data found.

## 2018-11-09 NOTE — H&P (Signed)
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 She was admitted to [facility] 4/16 with reports of somnolence most of the day. She was found to be hypotensive, dehydrated, significantly hypokalemic with K+ 2.1 and was difficult to arouse. She was started on IV Vanc/Cefepime for sepsis as well as fluids. On 4/19, as noted to have LUE numbness that progressed to LUE/LLE weakness with facial droop and dysarthria. CT head was negative for bleed and CTA negative for LVO. CT abdomen showed post op changes and decrease in fluid along surgical wound.  CT chest was showed right pleural effusion with atelectasis and mild left atelectasis.     She was not a candidate for tPA and neurology recommended full work up as well as avoidance of hypotension. MRI brain done revealing multiple small infarcts in right corona radiata, subcortical right parietal lobe and left cerebellum with moderate small vessel disease. 2D echo revealed EF 60-65% with negative agitation study and no wall abnormality.    Hem/Onc was consulted for input due to ongoing further drop in platelets to 21,000 thrombocytopenia and questioned SE of chemo v/s SQ heparin.  HIT panel negative and to monitor for signs of bleeding as well as counts daily for now --to add plavix once plts >50,000.  Blood cultures X 2 and UCS negative therefore antibiotics d/c by 4/22.  Stroke felt to be embolic  question due to hypercoagulable state from sepsis/cancer--DAPT recommended --to add plavix platelets  >100,000. ASA started today as platelets up to 60,000. Mentation and mood improving but patient continues to be limited by left sided weakness and difficulty following multi-step commands. CIR recommended due to functional deficits.       Review of Systems  Constitutional: Negative for chills and fever.  HENT: Negative for hearing loss and tinnitus.   Eyes: Negative for blurred vision and double vision.  Respiratory: Negative for cough.   Cardiovascular: Negative for chest pain and palpitations.  Gastrointestinal: Negative for abdominal pain, heartburn and nausea.  Genitourinary: Negative for dysuria, frequency and urgency.  Musculoskeletal: Negative for back pain and myalgias.  Skin: Negative for itching and rash.  Neurological: Positive for speech change and weakness. Negative for dizziness, sensory change and headaches.  Psychiatric/Behavioral: The patient is nervous/anxious.           Past Medical History:  Diagnosis Date   CAD (coronary artery disease)     CHF (congestive heart failure) (HCC)     CVA (cerebral infarction)     Female bladder prolapse     GERD (gastroesophageal reflux disease)     Hyperlipidemia     Hypertension     Hypokalemia     Mitral valve disease     Ovarian ca [facility])     Sleep apnea     Stroke [facility])      x3           Past Surgical History:  Procedure  Laterality Date   CARPAL TUNNEL RELEASE Bilateral     CATARACT EXTRACTION Right 11/22/2017   CHOLECYSTECTOMY       COLON SURGERY       TENNIS ELBOW RELEASE/NIRSCHEL PROCEDURE Left     TONSILLECTOMY               Family History  Problem Relation Age of Onset   Heart disease Other     Hypertension Other     Rheum arthritis Mother     CAD Father        Social History:  Married. Lives with husband. Used to work as a Quarry manager prior to recent surgeries (bladder tack, cataract,  exp lap).  Husband at home--disabled due to cardiac history but can provide supervision after discharge. Has 2 children who live OOT.   She reports that she has never smoked. She has never used smokeless tobacco. She reports that she does not drink alcohol or use drugs.           Allergies  Allergen Reactions   Atorvastatin Hives      No s/sx of anaphylaxis, just intermittent hives.  Does not seem to be a class effect as she has tolerated lovastatin previously No s/sx of anaphylaxis, just intermittent hives.  Does not seem to be a class effect as she has tolerated lovastatin previously   Fluconazole Rash      Reaction to generic but not name brand Reaction to generic but not name brand CAN USE DIFLUCAN.  DOES NOT BREAK OUT WITH DIFLUCAN.(allergic only to generic brand)   Sertraline Itching   Sulfa Antibiotics Rash and Other (See Comments)      "hot " sensation   Sulfamethoxazole-Trimethoprim Rash and Other (See Comments)      "felt hot" Other reaction(s): Other (See Comments) "felt hot"   Sulfasalazine Other (See Comments) and Rash      "hot " sensation            Medications Prior to Admission  Medication Sig Dispense Refill   acetaminophen (TYLENOL) 500 MG tablet Take 1,000 mg by mouth every 8 (eight) hours as needed for mild pain or headache.        albuterol (PROVENTIL) (2.5 MG/3ML) 0.083% nebulizer solution Take 2.5 mg by nebulization every 6 (six) hours as needed for wheezing.       aspirin 81 MG chewable tablet Chew 81 mg by mouth daily.       Cholecalciferol (VITAMIN D3) 5000 UNITS CAPS Take 5,000 Units by mouth daily.       clonazePAM (KLONOPIN) 0.5 MG tablet Take 0.5 mg by mouth at bedtime as needed for anxiety.       conjugated estrogens (PREMARIN) vaginal cream Place 1 Applicatorful vaginally daily. Mondays and Fridays       famotidine (PEPCID) 20 MG tablet Take 20 mg by mouth 2 (two) times daily.       fluticasone (FLONASE) 50 MCG/ACT nasal spray Place 2  sprays into both nostrils daily as needed for allergies or rhinitis.       Melatonin 3 MG TABS Take 3 mg by mouth at bedtime.       metoprolol succinate (TOPROL-XL) 25 MG 24 hr tablet Take 25 mg by mouth daily.       mirtazapine (REMERON) 15 MG tablet Take 15 mg by mouth at bedtime.       oxybutynin (DITROPAN) 5 MG tablet Take 5 mg by mouth 2 (two) times daily.  PARoxetine (PAXIL) 30 MG tablet Take 30 mg by mouth daily.       potassium chloride SA (K-DUR,KLOR-CON) 20 MEQ tablet Take 20 mEq by mouth daily.       pregabalin (LYRICA) 75 MG capsule Take 75-150 mg by mouth as directed. 75 mg in the morning and at noon. 150 mg at 9:30pm       rosuvastatin (CRESTOR) 20 MG tablet Take 20 mg by mouth at bedtime.        senna (SENOKOT) 8.6 MG TABS tablet Take 2 tablets by mouth at bedtime as needed for mild constipation.       spironolactone (ALDACTONE) 25 MG tablet Take 0.5 tablets (12.5 mg total) by mouth daily. 15 tablet 0   torsemide (DEMADEX) 20 MG tablet Take 1 tablet (20 mg total) by mouth daily. 180 tablet 0   traZODone (DESYREL) 50 MG tablet Take 50 mg by mouth at bedtime.       venlafaxine XR (EFFEXOR-XR) 75 MG 24 hr capsule Take 75 mg by mouth daily with breakfast.       cephALEXin (KEFLEX) 500 MG capsule Take 1 capsule (500 mg total) by mouth 3 (three) times daily. (Patient not taking: Reported on 11/02/2018) 21 capsule 0   diphenhydrAMINE (BENADRYL) 25 mg capsule Take 25 mg by mouth every 4 (four) hours as needed.       Docusate Calcium (STOOL SOFTENER PO) Take 100 mg by mouth daily.        miconazole (MICOTIN) 2 % powder Apply 1 application topically as needed for itching.       nitroGLYCERIN (NITROSTAT) 0.4 MG SL tablet Place 0.4 mg under the tongue every 5 (five) minutes as needed for chest pain.       ondansetron (ZOFRAN ODT) 4 MG disintegrating tablet Take 1 tablet (4 mg total) by mouth every 8 (eight) hours as needed for nausea or vomiting. 20 tablet 0      Drug  Regimen Review  Drug regimen was reviewed and remains appropriate with no significant issues identified   Home: Home Living Family/patient expects to be discharged to:: Private residence Living Arrangements: Spouse/significant other Available Help at Discharge: Family, Available 24 hours/day Type of Home: Apartment Home Access: Level entry Home Layout: One level Bathroom Shower/Tub: Tub/shower unit, Architectural technologist: Standard Bathroom Accessibility: Yes Home Equipment: Environmental consultant - 4 wheels, Bedside commode, Shower seat, Grab bars - tub/shower(2 canes of unknown type; bedside table)  Lives With: Spouse   Functional History: Prior Function Level of Independence: Needs assistance Gait / Transfers Assistance Needed: patient reports she ambulated mod I with rollator walker ADL's / Homemaking Assistance Needed: report she was I with all ADLs except donning shoes and socks; needed help with IADLs.  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   Functional Status:  Mobility: Bed Mobility Overal bed mobility: Needs Assistance Bed Mobility: Supine to Sit Supine to sit: Mod assist, Max assist Sit to supine: Min assist, +2 for physical assistance General bed mobility comments: assist for trunk and L UE supine to sit; minimal assist to scoot to edge of bed Transfers Overall transfer level: Needs assistance Equipment used: None, Hemi-walker, Left platform walker Transfers: Sit to/from Stand Sit to Stand: Min assist, +2 physical assistance Stand pivot transfers: Mod assist, +2 physical assistance  Lateral/Scoot Transfers: Mod assist, +2 physical assistance General transfer comment: x2 trials standing with no AD (R UE reaching for therapists arm for balance upon standing); x1 trial with  R hemi-walker; x2 trials with L platform walker; assist to initiate and come to full stand; vc's for B LE and B UE placement and DME use Ambulation/Gait Ambulation/Gait  assistance: Mod assist, +2 physical assistance Gait Distance (Feet): (bed to recliner (about 3 feet sidestepping)) Assistive device: None(R UE holding onto therapists arm for support) General Gait Details: assist to weight shift; assist to block L knee to prevent buckling with movement of R LE; vc's for technique Gait velocity: decreased   ADL: ADL Overall ADL's : Needs assistance/impaired Eating/Feeding: Set up, Bed level Grooming: Set up, Sitting, Wash/dry face, Oral care, Moderate assistance, Maximal assistance Grooming Details (indicate cue type and reason): Pt required physical assistance to maintain seated balance t/o seated grooming task EOB. She actively attempted to use her LUE to stabalize items (e.g. toothpaste tube, toothbrush, etc.) t/o session. When given Max/total physical assistance for mgt of LUE, pt was able to use BUE to complete grooming EOB. Pt noted to engage in multiple attempts at use of LUE for application of deodorant on this date. Upper Body Bathing: Sitting, Moderate assistance Lower Body Bathing: Sitting/lateral leans, Maximal assistance, +2 for physical assistance Upper Body Dressing : Sitting, Maximal assistance Lower Body Dressing: Sitting/lateral leans, Maximal assistance, +2 for physical assistance Toilet Transfer Details (indicate cue type and reason): unsafe to attempt  Functional mobility during ADLs: +2 for physical assistance, +2 for safety/equipment, Maximal assistance General ADL Comments: Pt required +2 max assist for functional stand pivot transfer from EOB to room recliner on this date. Pt attempted use of hemi-walker for functional mobility, and required max VC's for technique during STS transfers.    Cognition: Cognition Overall Cognitive Status: Within Functional Limits for tasks assessed Arousal/Alertness: Awake/alert Orientation Level: Oriented X4 Attention: Focused, Sustained Focused Attention: Appears intact Sustained Attention: Appears  intact Memory: Appears intact(WFL) Awareness: Appears intact Problem Solving: Appears intact Behaviors: (none) Safety/Judgment: Appears intact(grossly WFL) Cognition Arousal/Alertness: Awake/alert Behavior During Therapy: WFL for tasks assessed/performed, Flat affect Overall Cognitive Status: Within Functional Limits for tasks assessed General Comments: Inconsistent with multi-step commands (but improved from previous sessions)     Blood pressure 107/76, pulse (!) 110, temperature 98 F (36.7 C), temperature source Oral, resp. rate 20, height 5\' 2"  (1.575 m), weight 90.9 kg, SpO2 96 %. Physical Exam  Nursing note and vitals reviewed. Constitutional: She is oriented to person, place, and time. She appears well-developed and well-nourished.  HENT:  Head: Normocephalic.  Mouth/Throat: Oropharynx is clear and moist.  Eyes: Pupils are equal, round, and reactive to light. Conjunctivae and EOM are normal. Right eye exhibits no discharge. Left eye exhibits no discharge.  Neck: Normal range of motion. Neck supple.  Cardiovascular: Normal rate and regular rhythm.  No murmur heard. Respiratory: Effort normal and breath sounds normal. No stridor. She has no wheezes.  GI: Soft. Bowel sounds are normal. She exhibits no distension.  Abdominal incisions noted with dry flaky red incision. LLQ with large ecchymotic area with dime size necrotic/scabbed tissue.   Musculoskeletal:     Comments: LUE with min edema.   Neurological: She is alert and oriented to person, place, and time.  Mild left facial weakness without dysarthria. Decreased posture with tendency to lean to the right and some left inattention. LUE- deltoid 3/5, biceps/triceps 2/5, wrist 0/5, grip 2/5. LLE- HF/KE/PF/DF 4-/5.  Delayed processing. Impaired stm  Skin: Skin is warm and dry. She is not diaphoretic. No erythema.  Psychiatric: Her affect is blunt. Her speech is not slurred. She  is slowed.      Lab Results Last 48 Hours          Results for orders placed or performed during the hospital encounter of 11/01/18 (from the past 48 hour(s))  Basic metabolic panel     Status: None    Collection Time: 11/08/18  3:55 AM  Result Value Ref Range    Sodium 138 135 - 145 mmol/L    Potassium 3.6 3.5 - 5.1 mmol/L    Chloride 107 98 - 111 mmol/L    CO2 23 22 - 32 mmol/L    Glucose, Bld 97 70 - 99 mg/dL    BUN 9 8 - 23 mg/dL    Creatinine, Ser 0.53 0.44 - 1.00 mg/dL    Calcium 9.1 8.9 - 10.3 mg/dL    GFR calc non Af Amer >60 >60 mL/min    GFR calc Af Amer >60 >60 mL/min    Anion gap 8 5 - 15      Comment: Performed at [facility], [address] [place], [place] [address]  CBC     Status: Abnormal    Collection Time: 11/08/18  3:55 AM  Result Value Ref Range    WBC 2.8 (L) 4.0 - 10.5 K/uL    RBC 2.71 (L) 3.87 - 5.11 MIL/uL    Hemoglobin 7.9 (L) 12.0 - 15.0 g/dL    HCT 24.2 (L) 36.0 - 46.0 %    MCV 89.3 80.0 - 100.0 fL    MCH 29.2 26.0 - 34.0 pg    MCHC 32.6 30.0 - 36.0 g/dL    RDW 14.5 11.5 - 15.5 %    Platelets 37 (L) 150 - 400 K/uL      Comment: Immature Platelet Fraction may be clinically indicated, consider ordering this additional test [id number] CONSISTENT WITH PREVIOUS RESULT      nRBC 0.0 0.0 - 0.2 %      Comment: Performed at [facility], [address] [place], [place] [address]  CBC     Status: Abnormal    Collection Time: 11/09/18  3:28 AM  Result Value Ref Range    WBC 3.5 (L) 4.0 - 10.5 K/uL    RBC 2.78 (L) 3.87 - 5.11 MIL/uL    Hemoglobin 8.3 (L) 12.0 - 15.0 g/dL    HCT 25.0 (L) 36.0 - 46.0 %    MCV 89.9 80.0 - 100.0 fL    MCH 29.9 26.0 - 34.0 pg    MCHC 33.2 30.0 - 36.0 g/dL    RDW 14.4 11.5 - 15.5 %    Platelets 61 (L) 150 - 400 K/uL      Comment: Immature Platelet Fraction may be clinically indicated, consider ordering this additional test [id number] CONSISTENT WITH PREVIOUS RESULT      nRBC 0.0 0.0 - 0.2 %      Comment: Performed at [facility], [address] [place], [place] [address]  Basic metabolic panel     Status: None    Collection Time: 11/09/18  3:28 AM  Result Value Ref Range    Sodium 137 135 - 145 mmol/L    Potassium 4.2 3.5 - 5.1 mmol/L    Chloride 107 98 - 111 mmol/L    CO2 23 22 - 32 mmol/L    Glucose, Bld 98 70 - 99 mg/dL    BUN 9 8 - 23 mg/dL    Creatinine, Ser 0.46 0.44 - 1.00 mg/dL    Calcium 9.4 8.9 -  10.3 mg/dL    GFR calc non Af Amer >60 >60 mL/min    GFR calc Af Amer >60 >60 mL/min    Anion gap 7 5 - 15      Comment: Performed at [facility], [address], [place], [place] [address]      Imaging Results (Last 48 hours)  No results found.           Medical Problem List and Plan: 1.  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  Antithrombotics: -DVT/anticoagulation:  Mechanical: Sequential compression devices, below knee Bilateral lower extremities             -antiplatelet therapy: Low dose ASA resumed today. Plan is to add Plavix once plts > 100,000 3. Pain Management: Tylenol prn  4. Mood: LCSW to follow for evaluation and support.              -antipsychotic agents: N/A 5. Neuropsych: This patient is capable of making decisions on her own behalf. 6. Skin/Wound Care: Monitor abdominal wound for recurrant drainage as mobility improves.  7. Fluids/Electrolytes/Nutrition: follow I's and O's. Check labs tomorrow 8. H/o Ovarian CA: Chemo on hold. 9. Reactive depression/Anxiety: ON Effexor and Paxil. 10. Hyperactive bladder: Continue ditropan. 11. Thrombocytopenia: Continue to monitor platelets with daily CBC. 12. H/o systolic CHF: Continue daily weights. 1800 cc FR. ON spironolactone and Crestor. Off Demadex and metoprolol at this time.  13. Pancytopenia: Monitor for signs of bleeding.  H/H, plts and WBC recovering slowly.             -CBC daily  .  14. Hypokalemia: Improved with supplementation. Recheck in am.       Post Admission Physician Evaluation: 1. Functional deficits secondary  to embolic CVA. 2. Patient is admitted to receive collaborative, interdisciplinary care between the physiatrist, rehab nursing staff, and therapy team. 3. Patient's level of medical complexity and substantial therapy needs in context of that medical necessity cannot be provided at a lesser intensity of care such as a SNF. 4. Patient has experienced substantial functional loss from his/her baseline which was documented above under the "Functional History" and "Functional Status" headings.  Judging by the patient's diagnosis, physical exam, and functional history, the patient has potential for functional progress which will result in measurable gains while on inpatient rehab.  These gains will be of substantial and practical use upon discharge  in facilitating mobility and self-care at the household level. 5. Physiatrist will provide 24 hour management of medical needs as well as oversight of the therapy plan/treatment and provide guidance as appropriate regarding the interaction of the two. 6. The Preadmission Screening has been reviewed and patient status is unchanged unless otherwise stated above. 7. 24 hour rehab nursing will assist with bladder management, bowel management, safety, skin/wound care, disease management, medication administration, pain management and patient education  and help integrate therapy concepts, techniques,education, etc. 8. PT will assess and treat for/with: Lower extremity strength, range of motion, stamina, balance, functional mobility, safety, adaptive techniques and equipment, NMR, visual-spatial awareness.   Goals are: supervision. 9. OT will assess and treat for/with: ADL's, functional mobility, safety, upper extremity strength, adaptive techniques and equipment, NMR, visual-spatial awareness, family ed.   Goals are: supervision  to min assist. Therapy may proceed with showering this patient. 10. SLP will assess  and treat for/with: cognition, speech, communication, swallowing, family ed.  Goals are: supervision. 11. Case Management and Social Worker will assess and treat for psychological issues and discharge planning. 12. Team conference will be held weekly to assess progress toward goals and to determine barriers to discharge. 13. Patient will receive at least 3 hours of therapy per day at least 5 days per week. 14. ELOS: 12-17 days       15. Prognosis:  good    I have personally reviewed laboratory data, imaging studies, as well as relevant notes and concur with the physician assistant's documentation above.  [name], MD, Mellody Drown     Bary Leriche, PA-C 11/09/2018

## 2018-11-09 NOTE — H&P (Signed)
Physical Medicine and Rehabilitation Admission H&P    Chief Complaint  Patient presents with   Stroke and high grade ovarian cancer with functional decline.     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 She was admitted to [facility] 4/16 with reports of somnolence most of the day. She was found to be hypotensive, dehydrated, significantly hypokalemic with K+ 2.1 and was difficult to arouse. She was started on IV Vanc/Cefepime for sepsis as well as fluids. On 4/19, as noted to have LUE numbness that progressed to LUE/LLE weakness with facial droop and dysarthria. CT head was negative for bleed and CTA negative for LVO. CT abdomen showed post op changes and decrease in fluid along surgical wound.  CT chest was showed right pleural effusion with atelectasis and mild left atelectasis.    She was not a candidate for tPA and neurology recommended full work up as well as avoidance of hypotension. MRI brain done revealing multiple small infarcts in right corona radiata, subcortical right parietal lobe and left cerebellum with moderate small vessel disease. 2D echo revealed EF 60-65% with negative agitation study and no wall abnormality.    Hem/Onc was consulted for input due to ongoing further drop in platelets to 21,000 thrombocytopenia and questioned SE of chemo v/s SQ heparin.  HIT panel negative and to monitor for signs of bleeding as well as counts daily for now --to add plavix once plts >50,000.  Blood cultures X 2 and UCS negative therefore antibiotics d/c by 4/22.  Stroke felt to be embolic question due to  hypercoagulable state from sepsis/cancer--DAPT recommended --to add plavix platelets  >100,000. ASA started today as platelets up to 60,000. Mentation and mood improving but patient continues to be limited by left sided weakness and difficulty following multi-step commands. CIR recommended due to functional deficits.     Review of Systems  Constitutional: Negative for chills and fever.  HENT: Negative for hearing loss and tinnitus.   Eyes: Negative for blurred vision and double vision.  Respiratory: Negative for cough.   Cardiovascular: Negative for chest pain and palpitations.  Gastrointestinal: Negative for abdominal pain, heartburn and nausea.  Genitourinary: Negative for dysuria, frequency and urgency.  Musculoskeletal: Negative for back pain and myalgias.  Skin: Negative for itching and rash.  Neurological: Positive for speech change and weakness. Negative for dizziness, sensory change and headaches.  Psychiatric/Behavioral: The patient is nervous/anxious.      Past Medical History:  Diagnosis Date   CAD (coronary artery disease)    CHF (congestive heart failure) (HCC)    CVA (cerebral infarction)    Female bladder prolapse    GERD (gastroesophageal reflux disease)    Hyperlipidemia    Hypertension    Hypokalemia    Mitral valve disease    Ovarian ca [facility])    Sleep apnea    Stroke [facility])    x3    Past Surgical History:  Procedure Laterality Date   CARPAL TUNNEL RELEASE Bilateral    CATARACT EXTRACTION Right 11/22/2017   CHOLECYSTECTOMY     COLON SURGERY     TENNIS ELBOW RELEASE/NIRSCHEL PROCEDURE Left  TONSILLECTOMY      Family History  Problem Relation Age of Onset   Heart disease Other    Hypertension Other    Rheum arthritis Mother    CAD Father     Social History:  Married. Lives with husband. Used to work as a Quarry manager prior to recent surgeries (bladder tack, cataract, exp lap).  Husband at home--disabled due to cardiac history but can  provide supervision after discharge. Has 2 children who live OOT.   She reports that she has never smoked. She has never used smokeless tobacco. She reports that she does not drink alcohol or use drugs.    Allergies  Allergen Reactions   Atorvastatin Hives    No s/sx of anaphylaxis, just intermittent hives.  Does not seem to be a class effect as she has tolerated lovastatin previously No s/sx of anaphylaxis, just intermittent hives.  Does not seem to be a class effect as she has tolerated lovastatin previously   Fluconazole Rash    Reaction to generic but not name brand Reaction to generic but not name brand CAN USE DIFLUCAN.DOES NOT BREAK OUT WITH DIFLUCAN.(allergic only to generic brand)   Sertraline Itching   Sulfa Antibiotics Rash and Other (See Comments)    "hot " sensation   Sulfamethoxazole-Trimethoprim Rash and Other (See Comments)    "felt hot" Other reaction(s): Other (See Comments) "felt hot"   Sulfasalazine Other (See Comments) and Rash    "hot " sensation    Medications Prior to Admission  Medication Sig Dispense Refill   acetaminophen (TYLENOL) 500 MG tablet Take 1,000 mg by mouth every 8 (eight) hours as needed for mild pain or headache.      albuterol (PROVENTIL) (2.5 MG/3ML) 0.083% nebulizer solution Take 2.5 mg by nebulization every 6 (six) hours as needed for wheezing.     aspirin 81 MG chewable tablet Chew 81 mg by mouth daily.     Cholecalciferol (VITAMIN D3) 5000 UNITS CAPS Take 5,000 Units by mouth daily.     clonazePAM (KLONOPIN) 0.5 MG tablet Take 0.5 mg by mouth at bedtime as needed for anxiety.     conjugated estrogens (PREMARIN) vaginal cream Place 1 Applicatorful vaginally daily. Mondays and Fridays     famotidine (PEPCID) 20 MG tablet Take 20 mg by mouth 2 (two) times daily.     fluticasone (FLONASE) 50 MCG/ACT nasal spray Place 2 sprays into both nostrils daily as needed for allergies or rhinitis.     Melatonin 3 MG TABS Take 3 mg by  mouth at bedtime.     metoprolol succinate (TOPROL-XL) 25 MG 24 hr tablet Take 25 mg by mouth daily.     mirtazapine (REMERON) 15 MG tablet Take 15 mg by mouth at bedtime.     oxybutynin (DITROPAN) 5 MG tablet Take 5 mg by mouth 2 (two) times daily.     PARoxetine (PAXIL) 30 MG tablet Take 30 mg by mouth daily.     potassium chloride SA (K-DUR,KLOR-CON) 20 MEQ tablet Take 20 mEq by mouth daily.     pregabalin (LYRICA) 75 MG capsule Take 75-150 mg by mouth as directed. 75 mg in the morning and at noon. 150 mg at 9:30pm     rosuvastatin (CRESTOR) 20 MG tablet Take 20 mg by mouth at bedtime.      senna (SENOKOT) 8.6 MG TABS tablet Take 2 tablets by mouth at bedtime as needed for mild constipation.     spironolactone (ALDACTONE) 25 MG tablet Take 0.5 tablets (  12.5 mg total) by mouth daily. 15 tablet 0   torsemide (DEMADEX) 20 MG tablet Take 1 tablet (20 mg total) by mouth daily. 180 tablet 0   traZODone (DESYREL) 50 MG tablet Take 50 mg by mouth at bedtime.     venlafaxine XR (EFFEXOR-XR) 75 MG 24 hr capsule Take 75 mg by mouth daily with breakfast.     cephALEXin (KEFLEX) 500 MG capsule Take 1 capsule (500 mg total) by mouth 3 (three) times daily. (Patient not taking: Reported on 11/02/2018) 21 capsule 0   diphenhydrAMINE (BENADRYL) 25 mg capsule Take 25 mg by mouth every 4 (four) hours as needed.     Docusate Calcium (STOOL SOFTENER PO) Take 100 mg by mouth daily.      miconazole (MICOTIN) 2 % powder Apply 1 application topically as needed for itching.     nitroGLYCERIN (NITROSTAT) 0.4 MG SL tablet Place 0.4 mg under the tongue every 5 (five) minutes as needed for chest pain.     ondansetron (ZOFRAN ODT) 4 MG disintegrating tablet Take 1 tablet (4 mg total) by mouth every 8 (eight) hours as needed for nausea or vomiting. 20 tablet 0    Drug Regimen Review  Drug regimen was reviewed and remains appropriate with no significant issues identified  Home: Home Living Family/patient  expects to be discharged to:: Private residence Living Arrangements: Spouse/significant other Available Help at Discharge: Family, Available 24 hours/day Type of Home: Apartment Home Access: Level entry Home Layout: One level Bathroom Shower/Tub: Tub/shower unit, Architectural technologist: Standard Bathroom Accessibility: Yes Home Equipment: Environmental consultant - 4 wheels, Bedside commode, Shower seat, Grab bars - tub/shower(2 canes of unknown type; bedside table)  Lives With: Spouse   Functional History: Prior Function Level of Independence: Needs assistance Gait / Transfers Assistance Needed: patient reports she ambulated mod I with rollator walker ADL's / Homemaking Assistance Needed: report she was I with all ADLs except donning shoes and socks; needed help with IADLs.  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  Functional Status:  Mobility: Bed Mobility Overal bed mobility: Needs Assistance Bed Mobility: Supine to Sit Supine to sit: Mod assist, Max assist Sit to supine: Min assist, +2 for physical assistance General bed mobility comments: assist for trunk and L UE supine to sit; minimal assist to scoot to edge of bed Transfers Overall transfer level: Needs assistance Equipment used: None, Hemi-walker, Left platform walker Transfers: Sit to/from Stand Sit to Stand: Min assist, +2 physical assistance Stand pivot transfers: Mod assist, +2 physical assistance  Lateral/Scoot Transfers: Mod assist, +2 physical assistance General transfer comment: x2 trials standing with no AD (R UE reaching for therapists arm for balance upon standing); x1 trial with R hemi-walker; x2 trials with L platform walker; assist to initiate and come to full stand; vc's for B LE and B UE placement and DME use Ambulation/Gait Ambulation/Gait assistance: Mod assist, +2 physical assistance Gait Distance (Feet): (bed to recliner (about 3 feet sidestepping)) Assistive device: None(R UE  holding onto therapists arm for support) General Gait Details: assist to weight shift; assist to block L knee to prevent buckling with movement of R LE; vc's for technique Gait velocity: decreased    ADL: ADL Overall ADL's : Needs assistance/impaired Eating/Feeding: Set up, Bed level Grooming: Set up, Sitting, Wash/dry face, Oral care, Moderate assistance, Maximal assistance Grooming Details (indicate cue type and reason): Pt required physical assistance to maintain seated balance t/o seated grooming task EOB. She actively attempted to  use her LUE to stabalize items (e.g. toothpaste tube, toothbrush, etc.) t/o session. When given Max/total physical assistance for mgt of LUE, pt was able to use BUE to complete grooming EOB. Pt noted to engage in multiple attempts at use of LUE for application of deodorant on this date. Upper Body Bathing: Sitting, Moderate assistance Lower Body Bathing: Sitting/lateral leans, Maximal assistance, +2 for physical assistance Upper Body Dressing : Sitting, Maximal assistance Lower Body Dressing: Sitting/lateral leans, Maximal assistance, +2 for physical assistance Toilet Transfer Details (indicate cue type and reason): unsafe to attempt  Functional mobility during ADLs: +2 for physical assistance, +2 for safety/equipment, Maximal assistance General ADL Comments: Pt required +2 max assist for functional stand pivot transfer from EOB to room recliner on this date. Pt attempted use of hemi-walker for functional mobility, and required max VC's for technique during STS transfers.   Cognition: Cognition Overall Cognitive Status: Within Functional Limits for tasks assessed Arousal/Alertness: Awake/alert Orientation Level: Oriented X4 Attention: Focused, Sustained Focused Attention: Appears intact Sustained Attention: Appears intact Memory: Appears intact(WFL) Awareness: Appears intact Problem Solving: Appears intact Behaviors: (none) Safety/Judgment: Appears  intact(grossly WFL) Cognition Arousal/Alertness: Awake/alert Behavior During Therapy: WFL for tasks assessed/performed, Flat affect Overall Cognitive Status: Within Functional Limits for tasks assessed General Comments: Inconsistent with multi-step commands (but improved from previous sessions)   Blood pressure 107/76, pulse (!) 110, temperature 98 F (36.7 C), temperature source Oral, resp. rate 20, height 5\' 2"  (1.575 m), weight 90.9 kg, SpO2 96 %. Physical Exam  Nursing note and vitals reviewed. Constitutional: She is oriented to person, place, and time. She appears well-developed and well-nourished.  HENT:  Head: Normocephalic.  Mouth/Throat: Oropharynx is clear and moist.  Eyes: Pupils are equal, round, and reactive to light. Conjunctivae and EOM are normal. Right eye exhibits no discharge. Left eye exhibits no discharge.  Neck: Normal range of motion. Neck supple.  Cardiovascular: Normal rate and regular rhythm.  No murmur heard. Respiratory: Effort normal and breath sounds normal. No stridor. She has no wheezes.  GI: Soft. Bowel sounds are normal. She exhibits no distension.  Abdominal incisions noted with dry flaky red incision. LLQ with large ecchymotic area with dime size necrotic/scabbed tissue.   Musculoskeletal:     Comments: LUE with min edema.   Neurological: She is alert and oriented to person, place, and time.  Mild left facial weakness without dysarthria. Decreased posture with tendency to lean to the right and some left inattention. LUE- deltoid 3/5, biceps/triceps 2/5, wrist 0/5, grip 2/5. LLE- HF/KE/PF/DF 4-/5.  Delayed processing. Impaired stm  Skin: Skin is warm and dry. She is not diaphoretic. No erythema.  Psychiatric: Her affect is blunt. Her speech is not slurred. She is slowed.    Results for orders placed or performed during the hospital encounter of 11/01/18 (from the past 48 hour(s))  Basic metabolic panel     Status: None   Collection Time: 11/08/18   3:55 AM  Result Value Ref Range   Sodium 138 135 - 145 mmol/L   Potassium 3.6 3.5 - 5.1 mmol/L   Chloride 107 98 - 111 mmol/L   CO2 23 22 - 32 mmol/L   Glucose, Bld 97 70 - 99 mg/dL   BUN 9 8 - 23 mg/dL   Creatinine, Ser 0.53 0.44 - 1.00 mg/dL   Calcium 9.1 8.9 - 10.3 mg/dL   GFR calc non Af Amer >60 >60 mL/min   GFR calc Af Amer >60 >60 mL/min   Anion gap  8 5 - 15    Comment: Performed at [facility], [address] [place], [place] [address]  CBC     Status: Abnormal   Collection Time: 11/08/18  3:55 AM  Result Value Ref Range   WBC 2.8 (L) 4.0 - 10.5 K/uL   RBC 2.71 (L) 3.87 - 5.11 MIL/uL   Hemoglobin 7.9 (L) 12.0 - 15.0 g/dL   HCT 24.2 (L) 36.0 - 46.0 %   MCV 89.3 80.0 - 100.0 fL   MCH 29.2 26.0 - 34.0 pg   MCHC 32.6 30.0 - 36.0 g/dL   RDW 14.5 11.5 - 15.5 %   Platelets 37 (L) 150 - 400 K/uL    Comment: Immature Platelet Fraction may be clinically indicated, consider ordering this additional test [id number] CONSISTENT WITH PREVIOUS RESULT    nRBC 0.0 0.0 - 0.2 %    Comment: Performed at [facility], [address] [place], [place] [address]  CBC     Status: Abnormal   Collection Time: 11/09/18  3:28 AM  Result Value Ref Range   WBC 3.5 (L) 4.0 - 10.5 K/uL   RBC 2.78 (L) 3.87 - 5.11 MIL/uL   Hemoglobin 8.3 (L) 12.0 - 15.0 g/dL   HCT 25.0 (L) 36.0 - 46.0 %   MCV 89.9 80.0 - 100.0 fL   MCH 29.9 26.0 - 34.0 pg   MCHC 33.2 30.0 - 36.0 g/dL   RDW 14.4 11.5 - 15.5 %   Platelets 61 (L) 150 - 400 K/uL    Comment: Immature Platelet Fraction may be clinically indicated, consider ordering this additional test [id number] CONSISTENT WITH PREVIOUS RESULT    nRBC 0.0 0.0 - 0.2 %    Comment: Performed at [facility], [address] [place], [place] [address]  Basic metabolic panel     Status: None   Collection Time: 11/09/18  3:28 AM  Result Value Ref Range   Sodium 137 135 - 145 mmol/L   Potassium 4.2 3.5 - 5.1 mmol/L   Chloride 107 98 -  111 mmol/L   CO2 23 22 - 32 mmol/L   Glucose, Bld 98 70 - 99 mg/dL   BUN 9 8 - 23 mg/dL   Creatinine, Ser 0.46 0.44 - 1.00 mg/dL   Calcium 9.4 8.9 - 10.3 mg/dL   GFR calc non Af Amer >60 >60 mL/min   GFR calc Af Amer >60 >60 mL/min   Anion gap 7 5 - 15    Comment: Performed at [facility], [address] [place],  [address]   No results found.     Medical Problem List and Plan: 1.  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  Antithrombotics: -DVT/anticoagulation:  Mechanical: Sequential compression devices, below knee Bilateral lower extremities  -antiplatelet therapy: Low dose ASA resumed today. Plan is to add Plavix once plts > 100,000 3. Pain Management: Tylenol prn  4. Mood: LCSW to follow for evaluation and support.   -antipsychotic agents: N/A 5. Neuropsych: This patient is capable of making decisions on her own behalf. 6. Skin/Wound Care: Monitor abdominal wound for recurrant drainage as mobility improves.  7. Fluids/Electrolytes/Nutrition: follow I's and O's. Check labs tomorrow 8. H/o Ovarian CA: Chemo on hold. 9. Reactive depression/Anxiety: ON Effexor and Paxil. 10. Hyperactive bladder: Continue ditropan. 11. Thrombocytopenia: Continue to monitor platelets with daily CBC. 12. H/o systolic CHF: Continue  daily weights. 1800 cc FR. ON spironolactone and Crestor. Off Demadex and metoprolol at this time.  13. Pancytopenia: Monitor for signs of bleeding.  H/H, plts and WBC recovering slowly.  -CBC daily .  14. Hypokalemia: Improved with supplementation. Recheck in am.       Bary Leriche, PA-C 11/09/2018

## 2018-11-09 NOTE — Progress Notes (Signed)
Inpatient Rehabilitation Admissions Coordinator  Inpatient rehab bed is available to admit pt to today. I spoke with patient by phone as well as her spouse. Both in agreement to admit today. RN CM, Deliliah made aware. We will make the arrangements to admit today.  Danne Baxter, RN, MSN Rehab Admissions Coordinator [phone] 11/09/2018 9:48 AM

## 2018-11-09 NOTE — Progress Notes (Signed)
Patient left [facility] with [facility] to be transported to [facility]

## 2018-11-09 NOTE — Progress Notes (Signed)
OT Cancellation Note  Patient Details Name: Destiny [name] MRN: [id number] DOB: [birth year]   Cancelled Treatment:    Reason Eval/Treat Not Completed: Other (comment). Attempted to see pt x2 this am. Nursing with pt both times providing medication mgt. Will re-attempt at later time for OT treatment.   Jeni Salles, MPH, MS, OTR/L ascom [phone] 11/09/18, 9:22 AM

## 2018-11-09 NOTE — Progress Notes (Signed)
Patient admitted to IP Rehab. Oriented to unit and staff. No further questions at time. Adria Devon, LPN

## 2018-11-09 NOTE — TOC Progression Note (Signed)
Transition of Care [facility]) - Progression Note    Patient Details  Name: Destiny [name] MRN: [id number] Date of Birth: [birth year]  Transition of Care [facility]) CM/SW [place], RN Phone Number: 11/09/2018, 9:33 AM  Clinical Narrative:     Pamala Hurry with [facility] called and stated that they do have a bed to offer today to the patient, alerted the physician of the bed offer  Expected Discharge Plan: IP Rehab Facility([facility] accepted, awaitng a bed) Barriers to Discharge: Continued Medical Work up  Expected Discharge Plan and Services Expected Discharge Plan: IP Rehab Facility([facility] accepted, awaitng a bed)   Discharge Planning Services: CM Consult Post Acute Care Choice: [place] arrangements for the past 2 months: Single Family Home Expected Discharge Date: 11/06/18                 DME Agency: (home health already in place)                   Social Determinants of Health (SDOH) Interventions    Readmission Risk Interventions No flowsheet data found.

## 2018-11-09 NOTE — Progress Notes (Signed)
Hematology/Oncology Progress Note [facility] Telephone:([phone] [phone]  Patient Care Team: [name], MD as PCP - General (Family Medicine)   Name of the patient: Destiny [name]  [id number]  June 20, 1956  Date of visit: 11/09/18   INTERVAL HISTORY-  Patient is able to raise left upper extremity a bit. Continue improved left lower extremity strength.  No new complaints, No acute bleeding event.   Review of systems- Review of Systems  Constitutional: Positive for fatigue. Negative for appetite change and unexpected weight change.  HENT:   Negative for hearing loss.   Respiratory: Negative for cough and hemoptysis.   Cardiovascular: Negative for chest pain.  Gastrointestinal: Negative for abdominal distention and abdominal pain.  Genitourinary: Negative for difficulty urinating.   Musculoskeletal: Negative for arthralgias.       Left side weakness.   Skin: Negative for itching.  Neurological: Negative for dizziness.  Hematological: Negative for adenopathy.  Psychiatric/Behavioral: The patient is not nervous/anxious.     Allergies  Allergen Reactions   Atorvastatin Hives    No s/sx of anaphylaxis, just intermittent hives.  Does not seem to be a class effect as she has tolerated lovastatin previously No s/sx of anaphylaxis, just intermittent hives.  Does not seem to be a class effect as she has tolerated lovastatin previously   Fluconazole Rash    Reaction to generic but not name brand Reaction to generic but not name brand CAN USE DIFLUCAN.DOES NOT BREAK OUT WITH DIFLUCAN.(allergic only to generic brand)   Sertraline Itching   Sulfa Antibiotics Rash and Other (See Comments)    "hot " sensation   Sulfamethoxazole-Trimethoprim Rash and Other (See Comments)    "felt hot" Other reaction(s): Other (See Comments) "felt hot"   Sulfasalazine Other (See Comments) and Rash    "hot " sensation    Patient Active Problem List   Diagnosis Date Noted   Embolic stroke ([place]) 34/19/3790   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[place])    Thrombocytopenia ([place])    Cerebrovascular accident (CVA) ([place])    HLD (hyperlipidemia) 11/02/2018   GERD (gastroesophageal reflux disease) 11/02/2018   CAD (coronary artery disease) 11/02/2018   Sleep apnea 11/02/2018   Hypotension 11/02/2018   Somnolence 11/02/2018   Chronic diastolic heart failure ([place]) 11/15/2017   HTN (hypertension) 11/15/2017   Hypokalemia 11/05/2017   Chest pain 02/14/2016   Small bowel obstruction ([place]) 06/16/2015     Past Medical History:  Diagnosis Date   CAD (coronary artery disease)    CHF (congestive heart failure) (HCC)    CVA (cerebral infarction)    Female bladder prolapse    GERD (gastroesophageal reflux disease)    Hyperlipidemia    Hypertension    Hypokalemia    Mitral valve disease    Ovarian ca ([place])    Sleep apnea    Stroke ([place])    x3     Past Surgical History:  Procedure Laterality Date   CARPAL TUNNEL RELEASE Bilateral    CATARACT EXTRACTION Right 11/22/2017   CHOLECYSTECTOMY     COLON SURGERY     TENNIS ELBOW RELEASE/NIRSCHEL PROCEDURE Left    TONSILLECTOMY      Social History   Socioeconomic History   Marital status: Married    Spouse name: Not on file   Number of children: Not on file   Years of education: Not on file   Highest education level: Not on file  Occupational History   Not on file  Social Needs  Financial resource strain: Not on file   Food insecurity:    Worry: Never true    Inability: Never true   Transportation needs:    Medical: No    Non-medical: No  Tobacco Use   Smoking status: Never Smoker   Smokeless tobacco: Never Used  Substance and Sexual Activity   Alcohol use: No    Alcohol/week: 0.0 standard drinks   Drug use: No   Sexual activity: Not Currently  Lifestyle   Physical activity:    Days per week: 0 days    Minutes per session: 0  min   Stress: Only a little  Relationships   Social connections:    Talks on phone: Twice a week    Gets together: Once a week    Attends religious service: 1 to 4 times per year    Active member of club or organization: No    Attends meetings of clubs or organizations: Never    Relationship status: Married   Intimate partner violence:    Fear of current or ex partner: No    Emotionally abused: No    Physically abused: No    Forced sexual activity: No  Other Topics Concern   Not on file  Social History Narrative   Not on file     Family History  Problem Relation Age of Onset   Heart disease Other    Hypertension Other    Rheum arthritis Mother    CAD Father     No current facility-administered medications for this encounter.  No current outpatient medications on file.  Facility-Administered Medications Ordered in Other Encounters:    acetaminophen (TYLENOL) tablet 325-650 mg, 325-650 mg, Oral, Q4H PRN, Love, Pamela S, PA-C   albuterol (PROVENTIL) (2.5 MG/3ML) 0.083% nebulizer solution 2.5 mg, 2.5 mg, Inhalation, Q6H PRN, Love, Pamela S, PA-C   alteplase (CATHFLO ACTIVASE) injection 2 mg, 2 mg, Intracatheter, Once PRN, Love, Pamela S, PA-C   alum & mag hydroxide-simeth (MAALOX/MYLANTA) 200-200-20 MG/5ML suspension 30 mL, 30 mL, Oral, Q4H PRN, Love, Pamela S, PA-C   anticoagulant sodium citrate solution 5 mL, 5 mL, Intracatheter, PRN, Love, Pamela S, PA-C   aspirin chewable tablet 81 mg, 81 mg, Oral, Daily, Love, Pamela S, PA-C   bisacodyl (DULCOLAX) suppository 10 mg, 10 mg, Rectal, Daily PRN, Love, Pamela S, PA-C   clonazePAM (KLONOPIN) tablet 0.5 mg, 0.5 mg, Oral, QHS PRN, Love, Pamela S, PA-C   diphenhydrAMINE (BENADRYL) 12.5 MG/5ML elixir 12.5-25 mg, 12.5-25 mg, Oral, Q6H PRN, Love, Pamela S, PA-C   [START ON 11/10/2018] docusate sodium (COLACE) capsule 100 mg, 100 mg, Oral, Daily, Love, Pamela S, PA-C   famotidine (PEPCID) tablet 20 mg, 20 mg, Oral,  BID, Love, Pamela S, PA-C   feeding supplement (BOOST / RESOURCE BREEZE) liquid 1 Container, 1 Container, Oral, TID BM, Love, Pamela S, PA-C   guaiFENesin-dextromethorphan (ROBITUSSIN DM) 100-10 MG/5ML syrup 5-10 mL, 5-10 mL, Oral, Q6H PRN, Love, Pamela S, PA-C   [START ON 11/10/2018] multivitamin (PROSIGHT) tablet 1 tablet, 1 tablet, Oral, Daily, Love, Pamela S, PA-C   oxybutynin (DITROPAN) tablet 5 mg, 5 mg, Oral, BID, Love, Pamela S, PA-C   [START ON 11/10/2018] PARoxetine (PAXIL) tablet 30 mg, 30 mg, Oral, Daily, Love, Pamela S, PA-C   polyethylene glycol (MIRALAX / GLYCOLAX) packet 17 g, 17 g, Oral, Daily PRN, Love, Pamela S, PA-C   potassium chloride SA (K-DUR) CR tablet 40 mEq, 40 mEq, Oral, BID, Love, Pamela S, PA-C   pregabalin (  LYRICA) capsule 150 mg, 150 mg, Oral, QHS, Love, Pamela S, PA-C   [START ON 11/10/2018] pregabalin (LYRICA) capsule 75 mg, 75 mg, Oral, BID, Love, Pamela S, PA-C   prochlorperazine (COMPAZINE) tablet 5-10 mg, 5-10 mg, Oral, Q6H PRN **OR** prochlorperazine (COMPAZINE) injection 5-10 mg, 5-10 mg, Intramuscular, Q6H PRN **OR** prochlorperazine (COMPAZINE) suppository 12.5 mg, 12.5 mg, Rectal, Q6H PRN, Love, Pamela S, PA-C   rosuvastatin (CRESTOR) tablet 20 mg, 20 mg, Oral, QHS, Love, Pamela S, PA-C   sodium phosphate (FLEET) 7-19 GM/118ML enema 1 enema, 1 enema, Rectal, Once PRN, Love, Pamela S, PA-C   [START ON 11/10/2018] spironolactone (ALDACTONE) tablet 12.5 mg, 12.5 mg, Oral, Daily, Love, Pamela S, PA-C   traZODone (DESYREL) tablet 25-50 mg, 25-50 mg, Oral, QHS PRN, Love, Pamela S, PA-C   [START ON 11/10/2018] venlafaxine XR (EFFEXOR-XR) 24 hr capsule 75 mg, 75 mg, Oral, Q breakfast, Bary Leriche, [place]   Physical exam:  Vitals:   11/09/18 0145 11/09/18 0452 11/09/18 0912 11/09/18 1347  BP: 118/82 115/80 107/76 107/83  Pulse: 94 96 (!) 110 (!) 110  Resp: 18 (!) 21 20 18   Temp: 98.5 F (36.9 C) 97.9 F (36.6 C) 98 F (36.7 C) 98.9 F (37.2 C)    TempSrc: Oral  Oral   SpO2: 96% 93% 96% 97%  Weight:      Height:       Constitutional:No distress. HENT:  Head:Normocephalic.  Neck:Normal range of motion.  Cardiovascular:nromal rate.  Pulmonary/Chest:Effort normal.  Abdominal:Soft. Non distended.   Neurological: She isalert and awake.  Chronic hearing impairment. Right upper extremity and lower extremity 5 out of 5 Left upper extremity 2 out of 5, left lower extremity 2 out of 5. Skin: warm and dry. bruising on bilateral upper extremities.    CMP Latest Ref Rng & Units 11/09/2018  Glucose 70 - 99 mg/dL 98  BUN 8 - 23 mg/dL 9  Creatinine 0.44 - 1.00 mg/dL 0.46  Sodium 135 - 145 mmol/L 137  Potassium 3.5 - 5.1 mmol/L 4.2  Chloride 98 - 111 mmol/L 107  CO2 22 - 32 mmol/L 23  Calcium 8.9 - 10.3 mg/dL 9.4  Total Protein 6.5 - 8.1 g/dL -  Total Bilirubin 0.3 - 1.2 mg/dL -  Alkaline Phos 38 - 126 U/L -  AST 15 - 41 U/L -  ALT 0 - 44 U/L -   CBC Latest Ref Rng & Units 11/09/2018  WBC 4.0 - 10.5 K/uL 3.5(L)  Hemoglobin 12.0 - 15.0 g/dL 8.3(L)  Hematocrit 36.0 - 46.0 % 25.0(L)  Platelets 150 - 400 K/uL 61(L)   RADIOGRAPHIC STUDIES: I have personally reviewed the radiological images as listed and agreed with the findings in the report. Ct Abdomen Pelvis Wo Contrast  Result Date: 11/01/2018 CLINICAL DATA:  Possible cardiac arrest/hypotension, history of ovarian carcinoma EXAM: CT ABDOMEN AND PELVIS WITHOUT CONTRAST TECHNIQUE: Multidetector CT imaging of the abdomen and pelvis was performed following the standard protocol without IV contrast. COMPARISON:  10/16/2018 FINDINGS: Lower chest: Mild right basilar atelectasis is noted. Stable right-sided pleural effusion is seen. Hepatobiliary: No focal liver abnormality is seen. Status post cholecystectomy. No biliary dilatation. Pancreas: Unremarkable. No pancreatic ductal dilatation or surrounding inflammatory changes. Spleen: Normal in size without focal abnormality.  Adrenals/Urinary Tract: Adrenal glands are within normal limits. Kidneys are well visualized without obstructive change. Scattered small left renal calculi are noted better appreciated on the noncontrast study. The bladder is decompressed by Foley catheter. Stomach/Bowel: Colon is predominately decompressed.  No obstructive changes are seen. The appendix is not well visualized. No inflammatory changes to suggest appendicitis are seen. No small bowel obstructive changes are noted. Stomach is decompressed. Vascular/Lymphatic: Aortic atherosclerosis. No enlarged abdominal or pelvic lymph nodes. Reproductive: Uterus is stable in appearance. The ovaries are not visualized consistent with the recent surgical history. Other: Some mild peritoneal thickening is noted consistent with the patient's known history of omental disease. No free fluid is seen. Postsurgical changes are noted anteriorly. The degree of fluid along the surgical wound has decreased. Musculoskeletal: Degenerative changes of the lumbar spine are noted. No lytic or sclerotic lesions are seen. IMPRESSION: Stable right pleural effusion and right basilar atelectasis. Postoperative changes. 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 No acute abnormality is noted. Electronically Signed   By: Inez Catalina M.D.   On: 11/01/2018 23:42   Ct Angio Head W Or Wo Contrast  Result Date: 11/04/2018 CLINICAL DATA:  Left-sided weakness. EXAM: CT ANGIOGRAPHY HEAD AND NECK TECHNIQUE: Multidetector CT imaging of the head and neck was performed using the standard protocol during bolus administration of intravenous contrast. Multiplanar CT image reconstructions and MIPs were obtained to evaluate the vascular anatomy. Carotid stenosis measurements (when applicable) are obtained utilizing NASCET criteria, using the distal internal carotid diameter as the denominator. CONTRAST:  59mL OMNIPAQUE IOHEXOL 350 MG/ML SOLN COMPARISON:  Carotid  Doppler ultrasound 02/15/2016. Chest CT 11/01/2018. FINDINGS: CTA NECK FINDINGS Aortic arch: Standard 3 vessel aortic arch with widely patent arch vessel origins. Right carotid system: Patent with mild calcified plaque at the carotid bifurcation. No evidence of significant stenosis or dissection. Left carotid system: Patent with minimal calcified plaque at the carotid bifurcation. No evidence of significant stenosis or dissection. Vertebral arteries: Patent without evidence of stenosis or dissection. Mildly dominant left vertebral artery. Skeleton: Mild cervical spondylosis.  No suspicious osseous lesion. Other neck: No mass or enlarged lymph nodes. Upper chest: Partially visualized moderate-sized right pleural effusion, larger than on the recent chest CT. Associated compressive atelectasis in the right lung. Review of the MIP images confirms the above findings CTA HEAD FINDINGS Anterior circulation: The internal carotid arteries are patent from skull base to carotid termini with mild nonstenotic plaque bilaterally. ACAs and MCAs are patent with mild branch vessel irregularity but no evidence of proximal branch occlusion or significant proximal stenosis. The right ACA is dominant. No aneurysm is identified. Posterior circulation: The intracranial vertebral arteries are widely patent to the basilar. Patent left PICA, bilateral AICAs, and bilateral SCAs are identified. The basilar artery is widely patent. There is a fetal origin of the left PCA. A small right posterior communicating artery is present. Both PCAs are patent with branch vessel irregularity but no significant proximal stenosis. No aneurysm is identified. Venous sinuses: Patent. Anatomic variants: Fetal left PCA.  Dominant right ACA. Review of the MIP images confirms the above findings IMPRESSION: 1. Mild atherosclerosis in the head and neck without large vessel occlusion, significant stenosis, or aneurysm. 2. Moderate right pleural effusion, enlarged from  11/01/2018. These results were communicated to Dr. Lorraine Lax at 9:28 am on 11/04/2018 by text page via the [facility] messaging system. Electronically Signed   By: Logan Bores M.D.   On: 11/04/2018 09:38   Ct Head Wo Contrast  Result Date: 11/02/2018 CLINICAL DATA:  Increased somnolence EXAM: CT HEAD WITHOUT CONTRAST TECHNIQUE: Contiguous axial images were obtained from the base of the skull through the vertex without intravenous contrast. COMPARISON:  11/13/2017 FINDINGS: Brain: Mild  atrophic changes are noted. Stable chronic white matter ischemic changes are seen. Areas of prior frontal and parietal infarcts on the left are noted and stable. Dilated perivascular space in the left lentiform nucleus is again seen and stable. No findings to suggest acute hemorrhage, acute infarction or space-occupying mass lesion are noted. Cavum septum pellucidum is again seen. Vascular: No hyperdense vessel or unexpected calcification. Skull: Normal. Negative for fracture or focal lesion. Sinuses/Orbits: No acute finding. Other: None. IMPRESSION: Chronic changes without acute abnormality. Electronically Signed   By: Inez Catalina M.D.   On: 11/02/2018 00:25   Ct Angio Neck W Or Wo Contrast  Result Date: 11/04/2018 CLINICAL DATA:  Left-sided weakness. EXAM: CT ANGIOGRAPHY HEAD AND NECK TECHNIQUE: Multidetector CT imaging of the head and neck was performed using the standard protocol during bolus administration of intravenous contrast. Multiplanar CT image reconstructions and MIPs were obtained to evaluate the vascular anatomy. Carotid stenosis measurements (when applicable) are obtained utilizing NASCET criteria, using the distal internal carotid diameter as the denominator. CONTRAST:  65mL OMNIPAQUE IOHEXOL 350 MG/ML SOLN COMPARISON:  Carotid Doppler ultrasound 02/15/2016. Chest CT 11/01/2018. FINDINGS: CTA NECK FINDINGS Aortic arch: Standard 3 vessel aortic arch with widely patent arch vessel origins. Right carotid system: Patent  with mild calcified plaque at the carotid bifurcation. No evidence of significant stenosis or dissection. Left carotid system: Patent with minimal calcified plaque at the carotid bifurcation. No evidence of significant stenosis or dissection. Vertebral arteries: Patent without evidence of stenosis or dissection. Mildly dominant left vertebral artery. Skeleton: Mild cervical spondylosis.  No suspicious osseous lesion. Other neck: No mass or enlarged lymph nodes. Upper chest: Partially visualized moderate-sized right pleural effusion, larger than on the recent chest CT. Associated compressive atelectasis in the right lung. Review of the MIP images confirms the above findings CTA HEAD FINDINGS Anterior circulation: The internal carotid arteries are patent from skull base to carotid termini with mild nonstenotic plaque bilaterally. ACAs and MCAs are patent with mild branch vessel irregularity but no evidence of proximal branch occlusion or significant proximal stenosis. The right ACA is dominant. No aneurysm is identified. Posterior circulation: The intracranial vertebral arteries are widely patent to the basilar. Patent left PICA, bilateral AICAs, and bilateral SCAs are identified. The basilar artery is widely patent. There is a fetal origin of the left PCA. A small right posterior communicating artery is present. Both PCAs are patent with branch vessel irregularity but no significant proximal stenosis. No aneurysm is identified. Venous sinuses: Patent. Anatomic variants: Fetal left PCA.  Dominant right ACA. Review of the MIP images confirms the above findings IMPRESSION: 1. Mild atherosclerosis in the head and neck without large vessel occlusion, significant stenosis, or aneurysm. 2. Moderate right pleural effusion, enlarged from 11/01/2018. These results were communicated to Dr. Lorraine Lax at 9:28 am on 11/04/2018 by text page via the [facility] messaging system. Electronically Signed   By: Logan Bores M.D.   On: 11/04/2018  09:38   Ct Chest Wo Contrast  Result Date: 11/02/2018 CLINICAL DATA:  History of ovarian carcinoma and hypotension EXAM: CT CHEST WITHOUT CONTRAST TECHNIQUE: Multidetector CT imaging of the chest was performed following the standard protocol without IV contrast. COMPARISON:  Chest x-ray from earlier in the same day, CT from 11/13/2017 FINDINGS: Cardiovascular: Thoracic aorta shows atherosclerotic calcifications without aneurysmal dilatation. No cardiac enlargement is noted. Coronary calcifications are seen. The pulmonary artery is within normal limits. A few small air bubbles are noted within the main pulmonary artery elated to  the IV start. Mediastinum/Nodes: Nodular changes in the thyroid are again noted stable from the prior exam consistent with underlying goiter. No significant hilar or mediastinal adenopathy is noted. The esophagus as visualized is within normal limits. Lungs/Pleura: Mild left basilar atelectatic changes are noted. Right-sided pleural effusion is noted with some patchy basilar infiltrate/atelectasis. No significant nodularity is noted. Upper Abdomen: Within normal limits as previously seen on CT of the abdomen. Musculoskeletal: Degenerative changes of the thoracic spine are noted. No acute bony abnormality is noted. IMPRESSION: Stable nodular changes within the thyroid consistent with goiter. Right-sided pleural effusion with underlying atelectasis/infiltrate. Mild left basilar atelectasis is noted as well. Aortic Atherosclerosis (ICD10-I70.0). Electronically Signed   By: Inez Catalina M.D.   On: 11/02/2018 00:28   Mr Brain Wo Contrast  Result Date: 11/04/2018 CLINICAL DATA:  Left-sided numbness and weakness. History of ovarian cancer. EXAM: MRI HEAD WITHOUT CONTRAST TECHNIQUE: Multiplanar, multiecho pulse sequences of the brain and surrounding structures were obtained without intravenous contrast. COMPARISON:  Head CT 11/04/2018 and MRI 09/09/2011 FINDINGS: Brain: Small acute  subcortical infarcts are present in the posterior aspect of the right corona radiata and posterior right parietal lobe. There is also a 1 cm acute infarct anteriorly in the left cerebellar hemisphere. No intracranial hemorrhage, mass, midline shift, or extra-axial fluid collection is identified. Chronic cortical infarcts are again seen in the left frontal and left parietal lobes. There also tiny chronic cortical infarcts in the right frontal lobe and right temporoparietal junction. Small chronic infarcts are also again seen in the cerebellum bilaterally. There are dilated perivascular spaces in the basal ganglia bilaterally including a 2 cm perivascular space on the left. A cavum septum pellucidum et vergae is incidentally noted. Patchy T2 hyperintensities in the cerebral white matter bilaterally have progressed from the prior MRI and are nonspecific but compatible with moderate chronic small vessel ischemic disease. Moderate cerebral atrophy has also mildly progressed from the prior MRI. Vascular: Major intracranial vascular flow voids are preserved. Skull and upper cervical spine: Unremarkable bone marrow signal. Sinuses/Orbits: Bilateral cataract extraction. Paranasal sinuses and mastoid air cells are clear. Other: None. IMPRESSION: 1. Small acute infarcts in the right corona radiata, subcortical right parietal lobe, and left cerebellum. 2. Moderate chronic small vessel ischemic disease with chronic infarcts as above. Electronically Signed   By: Logan Bores M.D.   On: 11/04/2018 11:57   Dg Chest Port 1 View  Result Date: 11/01/2018 CLINICAL DATA:  63 y/o  F; loss of consciousness. EXAM: PORTABLE CHEST 1 VIEW COMPARISON:  08/17/2018 chest radiograph FINDINGS: Given positioning the lung apices are obscured by the patient's face and neck. Stable cardiac silhouette given projection and technique. Right port catheter tip projects over cavoatrial junction. Blunted costal diaphragmatic angles, likely small  effusions. Pulmonary reticular opacities. No pneumothorax. No acute osseous abnormality is evident. IMPRESSION: Blunted costal diaphragmatic angles, likely small effusions. Pulmonary reticular opacities may represent interstitial edema. Electronically Signed   By: Kristine Garbe M.D.   On: 11/01/2018 22:47   Ct Head Code Stroke Wo Contrast`  Result Date: 11/04/2018 CLINICAL DATA:  Code stroke.  Left-sided weakness. EXAM: CT HEAD WITHOUT CONTRAST TECHNIQUE: Contiguous axial images were obtained from the base of the skull through the vertex without intravenous contrast. COMPARISON:  11/01/2018 FINDINGS: Brain: There is no evidence of acute infarct, intracranial hemorrhage, mass, midline shift, or extra-axial fluid collection. Chronic infarcts are again seen in the left frontal lobe, left parietal lobe, and bilateral cerebellum. A prominent dilated perivascular  space in the left lentiform nucleus is unchanged. Patchy hypodensities in the cerebral white matter bilaterally are unchanged and nonspecific but compatible with moderate chronic small vessel ischemic disease. There is mild-to-moderate cerebral atrophy. A cavum septum pellucidum et vergae is again noted Vascular: Calcified atherosclerosis at the skull base. No hyperdense vessel. Skull: No fracture or focal osseous lesion. Sinuses/Orbits: Left sphenoid sinus mucosal thickening. Clear mastoid air cells. Bilateral cataract extraction. Other: None ASPECTS ([place] Stroke Program Early CT Score) - Ganglionic level infarction (caudate, lentiform nuclei, internal capsule, insula, M1-M3 cortex): 7 - Supraganglionic infarction (M4-M6 cortex): 3 Total score (0-10 with 10 being normal): 10 IMPRESSION: 1. No evidence of acute intracranial abnormality. 2. ASPECTS is 10. 3. Moderate chronic small vessel ischemic disease with chronic infarcts as above. These results were communicated to Dr. Lorraine Lax at 9:28 am on 11/04/2018 by text page via the [facility] messaging  system. Electronically Signed   By: Logan Bores M.D.   On: 11/04/2018 09:28    Assessment and plan-  Patient is a 63 y.o. female history of high-grade serous carcinoma status post surgery, CAD, CHF, GERD, hyperlipidemia, hypertension, mitral valve disease, sleep apnea previous stroke who is currently admitted due to altered mental status, sepsis, and also developed acute stroke during her hospitalization  # Thrombocytopenia, chemotherapy induced.  Platelet continues to improve, today's count 61,000.  Ok to start antiplatelet therapy.  Repeat cbc in 1 week to ensure complete recovery.   # Acute CVA, she will go to rehab facility later today.   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  Did not tolerate single agent chemotherapy.  She will continue to follow up with Gyn[facility]c Dr[facility] [place]. Husband wants to also establish care at [facility] for co-management and palliative care.  She can follow up with me outpatient as patient and family wish.   # leukopenia, improved.   Thank you for allowing me to participate in the care of this patient.   Earlie Server, MD, PhD Hematology Oncology [facility] Pager- [phone] 11/09/2018

## 2018-11-09 NOTE — Discharge Summary (Signed)
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)   Thrombocytopenia (HCC)   Cerebrovascular accident (CVA) ([place])   SECONDARY DIAGNOSIS:   Past Medical History:  Diagnosis Date   CAD (coronary artery disease)    CHF (congestive heart failure) (HCC)    CVA (cerebral infarction)    Female bladder prolapse    GERD (gastroesophageal reflux disease)    Hyperlipidemia    Hypertension    Hypokalemia    Mitral valve disease    Ovarian ca ([place])    Sleep apnea    Stroke ([place])    x3    HOSPITAL COURSE:   63 year old female with past medical history of ovarian cancer, hypertension, hyperlipidemia, GERD, history of previous CVA, CHF, coronary disease-admitted to hospital for sepsis.  Had an acute CVA during this hospitalization.  1. Sepsis- present on admission secondary to pneumonia on CT chest. -Resolved now.Cultures remain negative.   Finished antibiotics.  Hemodynamically stable.  2. Acute CVA-patient developed left-sided facial droop and left upper extremity weakness on 11/04/18.  - CT head is negative.. Neurology consult obtained. Patient underwent CTA of the head neck which is negative for large vessel disease. -MRI was positive for right-sided coronary radiata and thalamic small vessel CVA. Given her severe anemia and thrombocytopenia patient  initially not on antiplatelet agents. Now asa restarted at discharge as plts >50K today. Will ideally need dual anti plt agents - plavix can be started once plts improve to >100k - echocardiogram done showing no acute evidence of thrombus. Continue statin  3. 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 -Seen by hematology/oncology and no evidence of DIC or any further etiology of thrombocytopenia. -Continue to follow serial counts.  - asa restarted as plts improving nad >50k today  4. 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 -Appreciate inpatient hematology input  5. History of urinary incontinence-continue oxybutynin.  6. Anxiety-continue Paxil, effexor, trazodone and Klonopin.  7. Hyperlipidemia - cont. Crestor.   Physical therapy recommended CIR.  Accepted, discharge today    DISCHARGE CONDITIONS:   Guarded  CONSULTS OBTAINED:   Treatment Team:  Earlie Server, MD  DRUG ALLERGIES:   Allergies  Allergen Reactions   Atorvastatin Hives    No s/sx of anaphylaxis, just intermittent hives.  Does not seem to be a class effect as she has tolerated lovastatin previously No s/sx of anaphylaxis, just intermittent hives.  Does not seem to be a class effect as she has tolerated lovastatin previously   Fluconazole Rash    Reaction to generic but not name brand Reaction to generic but not name brand CAN USE DIFLUCAN.DOES NOT BREAK OUT WITH DIFLUCAN.(allergic only to generic brand)   Sertraline Itching   Sulfa Antibiotics Rash and Other (See Comments)    "hot " sensation  Sulfamethoxazole-Trimethoprim Rash and Other (See Comments)    "felt hot" Other reaction(s): Other (See Comments) "felt hot"   Sulfasalazine Other (See Comments) and Rash    "hot " sensation   DISCHARGE MEDICATIONS:   Allergies as of 11/09/2018       Reactions   Atorvastatin Hives   No s/sx of anaphylaxis, just intermittent hives.  Does not seem to be a class effect as she has tolerated lovastatin previously No s/sx of anaphylaxis, just intermittent hives.  Does not seem to be a class effect as she has tolerated lovastatin previously   Fluconazole Rash   Reaction to generic but not name brand Reaction to generic but not name brand CAN USE DIFLUCAN.DOES NOT BREAK OUT WITH DIFLUCAN.(allergic only to generic brand)   Sertraline Itching   Sulfa Antibiotics Rash, Other (See Comments)   "hot " sensation   Sulfamethoxazole-trimethoprim Rash, Other (See Comments)   "felt hot" Other reaction(s): Other (See Comments) "felt hot"   Sulfasalazine Other (See Comments), Rash   "hot " sensation      Medication List    STOP taking these medications   cephALEXin 500 MG capsule Commonly known as:  KEFLEX   conjugated estrogens vaginal cream Commonly known as:  PREMARIN   diphenhydrAMINE 25 mg capsule Commonly known as:  BENADRYL   miconazole 2 % powder Commonly known as:  MICOTIN   mirtazapine 15 MG tablet Commonly known as:  REMERON   ondansetron 4 MG disintegrating tablet Commonly known as:  Zofran ODT   potassium chloride SA 20 MEQ tablet Commonly known as:  K-DUR   torsemide 20 MG tablet Commonly known as:  DEMADEX     TAKE these medications   acetaminophen 500 MG tablet Commonly known as:  TYLENOL Take 1,000 mg by mouth every 8 (eight) hours as needed for mild pain or headache.   albuterol (2.5 MG/3ML) 0.083% nebulizer solution Commonly known as:  PROVENTIL Take 2.5 mg by nebulization every 6 (six) hours as needed for wheezing.   aspirin 81 MG chewable tablet Chew 81 mg by mouth daily.   clonazePAM 0.5 MG tablet Commonly known as:  KLONOPIN Take 0.5 mg by mouth at bedtime as needed for anxiety.   famotidine 20 MG tablet Commonly known as:  PEPCID Take 20 mg by mouth 2 (two) times daily.   fluticasone 50  MCG/ACT nasal spray Commonly known as:  FLONASE Place 2 sprays into both nostrils daily as needed for allergies or rhinitis.   Melatonin 3 MG Tabs Take 3 mg by mouth at bedtime.   metoprolol succinate 25 MG 24 hr tablet Commonly known as:  TOPROL-XL Take 25 mg by mouth daily.   multivitamin-lutein Caps capsule Take 1 capsule by mouth daily. Start taking on:  November 10, 2018   nitroGLYCERIN 0.4 MG SL tablet Commonly known as:  NITROSTAT Place 0.4 mg under the tongue every 5 (five) minutes as needed for chest pain.   oxybutynin 5 MG tablet Commonly known as:  DITROPAN Take 5 mg by mouth 2 (two) times daily.   PARoxetine 30 MG tablet Commonly known as:  PAXIL Take 30 mg by mouth daily.   pregabalin 75 MG capsule Commonly known as:  LYRICA Take 75-150 mg by mouth as directed. 75 mg in the morning and at noon. 150 mg at 9:30pm   rosuvastatin 20 MG tablet Commonly known as:  CRESTOR Take 20 mg by mouth at bedtime.   senna 8.6 MG Tabs tablet Commonly known as:  [place] [employer]  Take 2 tablets by mouth at bedtime as needed for mild constipation.   spironolactone 25 MG tablet Commonly known as:  ALDACTONE Take 0.5 tablets (12.5 mg total) by mouth daily.   STOOL SOFTENER PO Take 100 mg by mouth daily.   traZODone 50 MG tablet Commonly known as:  DESYREL Take 50 mg by mouth at bedtime.   venlafaxine XR 75 MG 24 hr capsule Commonly known as:  EFFEXOR-XR Take 75 mg by mouth daily with breakfast.   Vitamin D3 125 MCG (5000 UT) Caps Take 5,000 Units by mouth daily.        DISCHARGE INSTRUCTIONS:   1. [facility] 2. PCP f/u in 2 weeks 3. Neurology f/u after discharge 4. Oncology f/u as prior scheduled after discharge  DIET:   Diet recommended at Discharge: [place] (foods cooked; CUT for easier self-feeding and mastication secondary to new LUE weakness); Thin liquids. General aspiration precautions; FULL tray setup at meals d/t LUE weakness. Pills  given in puree for safer swallowing d/t Cognitive status.   ACTIVITY:   Activity as tolerated  OXYGEN:   Home Oxygen: No.  Oxygen Delivery: room air  DISCHARGE LOCATION:   [facility]   If you experience worsening of your admission symptoms, develop shortness of breath, life threatening emergency, suicidal or homicidal thoughts you must seek medical attention immediately by calling 911 or calling your MD immediately  if symptoms less severe.  You Must read complete instructions/literature along with all the possible adverse reactions/side effects for all the Medicines you take and that have been prescribed to you. Take any new Medicines after you have completely understood and accpet all the possible adverse reactions/side effects.   Please note  You were cared for by a hospitalist during your hospital stay. If you have any questions about your discharge medications or the care you received while you were in the hospital after you are discharged, you can call the unit and asked to speak with the hospitalist on call if the hospitalist that took care of you is not available. Once you are discharged, your primary care physician will handle any further medical issues. Please note that NO REFILLS for any discharge medications will be authorized once you are discharged, as it is imperative that you return to your primary care physician (or establish a relationship with a primary care physician if you do not have one) for your aftercare needs so that they can reassess your need for medications and monitor your lab values.    On the day of Discharge:  VITAL SIGNS:   Blood pressure 107/76, pulse (!) 110, temperature 98 F (36.7 C), temperature source Oral, resp. rate 20, height 5\' 2"  (1.575 m), weight 90.9 kg, SpO2 96 %.  PHYSICAL EXAMINATION:    GENERAL:  63 y.o.-year-old patient lying in the bed with no acute distress.  EYES: Pupils equal, round, reactive to light and  accommodation. No scleral icterus. Extraocular muscles intact.  HEENT: Head atraumatic, normocephalic. Oropharynx and nasopharynx clear.  NECK:  Supple, no jugular venous distention. No thyroid enlargement, no tenderness.  LUNGS: Normal breath sounds bilaterally, no wheezing, rales,rhonchi or crepitation. No use of accessory muscles of respiration.  CARDIOVASCULAR: S1, S2 normal. No murmurs, rubs, or gallops.  ABDOMEN: Soft, nontender, nondistended. Bowel sounds present. No organomegaly or mass.  EXTREMITIES: No pedal edema, cyanosis, or clubbing.  NEUROLOGIC: Cranial nerves II through XII are intact.  Left upper extremity strength is 2/5, motor strength is 5/5  in lower extremities and right upper extremity.  Sensation intact. Gait not checked.  PSYCHIATRIC: The patient is alert and oriented x 3.  SKIN: No obvious rash, lesion, or ulcer.   DATA REVIEW:   CBC Recent Labs  Lab 11/09/18 0328  WBC 3.5*  HGB 8.3*  HCT 25.0*  PLT 61*    Chemistries  Recent Labs  Lab 11/07/18 0524  11/09/18 0328  NA 136   < > 137  K 3.1*   < > 4.2  CL 104   < > 107  CO2 23   < > 23  GLUCOSE 99   < > 98  BUN 10   < > 9  CREATININE 0.68   < > 0.46  CALCIUM 9.1   < > 9.4  MG 2.0  --   --    < > = values in this interval not displayed.     Microbiology Results  Results for orders placed or performed during the hospital encounter of 11/01/18  Blood Culture (routine x 2)     Status: None   Collection Time: 11/01/18  9:45 PM  Result Value Ref Range Status   Specimen Description BLOOD BLOOD LEFT FOREARM  Final   Special Requests   Final    BOTTLES DRAWN AEROBIC AND ANAEROBIC Blood Culture adequate volume   Culture   Final    NO GROWTH 5 DAYS Performed at [facility], [address] [place], [place] [address]    Report Status 11/06/2018 FINAL  Final  Blood Culture (routine x 2)     Status: None   Collection Time: 11/01/18  9:45 PM  Result Value Ref Range Status   Specimen  Description BLOOD BLOOD RIGHT HAND  Final   Special Requests   Final    BOTTLES DRAWN AEROBIC AND ANAEROBIC Blood Culture adequate volume   Culture   Final    NO GROWTH 5 DAYS Performed at [facility], [address], [place], [place] [address]    Report Status 11/06/2018 FINAL  Final  Urine culture     Status: None   Collection Time: 11/01/18  9:45 PM  Result Value Ref Range Status   Specimen Description   Final    URINE, RANDOM Performed at [facility], [address] [place], [place] [address]    Special Requests   Final    NONE Performed at [facility], [address], [place], [place] [address]    Culture   Final    NO GROWTH Performed at [facility], [place] [address] [place], [place] [address]    Report Status 11/03/2018 FINAL  Final  MRSA PCR Screening     Status: None   Collection Time: 11/02/18  3:36 PM  Result Value Ref Range Status   MRSA by PCR NEGATIVE NEGATIVE Final    Comment:        The GeneXpert MRSA Assay (FDA approved for NASAL specimens only), is one component of a comprehensive MRSA colonization surveillance program. It is not intended to diagnose MRSA infection nor to guide or monitor treatment for MRSA infections. Performed at [facility], [address] [place],  [address]   SARS Coronavirus 2 [facility] order, Performed in [place] hospital lab)     Status: None   Collection Time: 11/05/18  6:38 PM  Result Value Ref Range Status   SARS Coronavirus 2 NEGATIVE NEGATIVE Final    Comment: (NOTE) If result is NEGATIVE SARS-CoV-2  target nucleic acids are NOT DETECTED. The SARS-CoV-2 RNA is generally detectable in upper and lower  respiratory specimens during the acute phase of infection. The lowest  concentration of SARS-CoV-2 viral copies this assay can detect is 250  copies / mL. A negative result does not preclude SARS-CoV-2 infection  and should not be used as the sole basis for  treatment or other  patient management decisions.  A negative result may occur with  improper specimen collection / handling, submission of specimen other  than nasopharyngeal swab, presence of viral mutation(s) within the  areas targeted by this assay, and inadequate number of viral copies  (<250 copies / mL). A negative result must be combined with clinical  observations, patient history, and epidemiological information. If result is POSITIVE SARS-CoV-2 target nucleic acids are DETECTED. The SARS-CoV-2 RNA is generally detectable in upper and lower  respiratory specimens dur ing the acute phase of infection.  Positive  results are indicative of active infection with SARS-CoV-2.  Clinical  correlation with patient history and other diagnostic information is  necessary to determine patient infection status.  Positive results do  not rule out bacterial infection or co-infection with other viruses. If result is PRESUMPTIVE POSTIVE SARS-CoV-2 nucleic acids MAY BE PRESENT.   A presumptive positive result was obtained on the submitted specimen  and confirmed on repeat testing.  While 2019 novel coronavirus  (SARS-CoV-2) nucleic acids may be present in the submitted sample  additional confirmatory testing may be necessary for epidemiological  and / or clinical management purposes  to differentiate between  SARS-CoV-2 and other Sarbecovirus currently known to infect humans.  If clinically indicated additional testing with an alternate test  methodology [phone]) is advised. The SARS-CoV-2 RNA is generally  detectable in upper and lower respiratory sp ecimens during the acute  phase of infection. The expected result is Negative. Fact Sheet for Patients:  StrictlyIdeas.no Fact Sheet for Healthcare Providers: BankingDealers.co.za This test is not yet approved or cleared by the [place] FDA and has been authorized for detection and/or  diagnosis of SARS-CoV-2 by FDA under an Emergency Use Authorization (EUA).  This EUA will remain in effect (meaning this test can be used) for the duration of the COVID-19 declaration under Section 564(b)(1) of the Act, 21 U.S.C. section 360bbb-3(b)(1), unless the authorization is terminated or revoked sooner. Performed at [facility], [address], [place], [place] [address]     RADIOLOGY:  No results found.   Management plans discussed with the patient, family and they are in agreement.  CODE STATUS:     Code Status Orders  (From admission, onward)         Start     Ordered   11/02/18 0449  Full code  Continuous     11/02/18 0448        Code Status History    Date Active Date Inactive Code Status Order ID Comments User Context   11/13/2017 0804 11/14/2017 1819 Full Code [id number]  Loletha Grayer, MD ED   11/05/2017 1638 11/06/2017 2025 Full Code [id number]  Salary, Avel Peace, MD Inpatient   02/05/2017 0035 02/05/2017 2254 Full Code [id number]  Valinda Party, DO Inpatient   02/15/2016 0806 02/15/2016 2136 Full Code [id number]  Hillary Bow, MD Inpatient   02/14/2016 1728 02/15/2016 0747 Full Code [id number]  Dustin Flock, MD ED   06/16/2015 1753 06/19/2015 1822 Full Code [id number]  Clayburn Pert, MD Inpatient      TOTAL TIME TAKING CARE OF  THIS PATIENT: 41 minutes.    Gladstone Lighter M.D on 11/09/2018 at 11:07 AM  Between 7am to 6pm - Pager - [phone]  After 6pm go to [email or web address] - Proofreader  [facility]  Office  [phone]  CC: Primary care physician; [name], MD   Note: This dictation was prepared with Dragon dictation along with smaller phrase technology. Any transcriptional errors that result from this process are unintentional.

## 2018-11-10 ENCOUNTER — Inpatient Hospital Stay (HOSPITAL_COMMUNITY): Payer: Self-pay | Admitting: Physical Therapy

## 2018-11-10 ENCOUNTER — Inpatient Hospital Stay (HOSPITAL_COMMUNITY): Payer: Self-pay | Admitting: Speech Pathology

## 2018-11-10 ENCOUNTER — Inpatient Hospital Stay (HOSPITAL_COMMUNITY): Payer: Self-pay | Admitting: Occupational Therapy

## 2018-11-10 DIAGNOSIS — C569 Malignant neoplasm of unspecified ovary: Secondary | ICD-10-CM

## 2018-11-10 DIAGNOSIS — I69398 Other sequelae of cerebral infarction: Secondary | ICD-10-CM

## 2018-11-10 DIAGNOSIS — R269 Unspecified abnormalities of gait and mobility: Secondary | ICD-10-CM

## 2018-11-10 DIAGNOSIS — I639 Cerebral infarction, unspecified: Secondary | ICD-10-CM

## 2018-11-10 DIAGNOSIS — G8192 Hemiplegia, unspecified affecting left dominant side: Secondary | ICD-10-CM

## 2018-11-10 LAB — CBC WITH DIFFERENTIAL/PLATELET
Abs Immature Granulocytes: 0.05 10*3/uL (ref 0.00–0.07)
Basophils Absolute: 0 10*3/uL (ref 0.0–0.1)
Basophils Relative: 0 %
Eosinophils Absolute: 0.1 10*3/uL (ref 0.0–0.5)
Eosinophils Relative: 2 %
HCT: 27 % — ABNORMAL LOW (ref 36.0–46.0)
Hemoglobin: 8.7 g/dL — ABNORMAL LOW (ref 12.0–15.0)
Immature Granulocytes: 1 %
Lymphocytes Relative: 28 %
Lymphs Abs: 1.1 10*3/uL (ref 0.7–4.0)
MCH: 29.5 pg (ref 26.0–34.0)
MCHC: 32.2 g/dL (ref 30.0–36.0)
MCV: 91.5 fL (ref 80.0–100.0)
Monocytes Absolute: 0.7 10*3/uL (ref 0.1–1.0)
Monocytes Relative: 18 %
Neutro Abs: 2.1 10*3/uL (ref 1.7–7.7)
Neutrophils Relative %: 51 %
Platelets: 95 10*3/uL — ABNORMAL LOW (ref 150–400)
RBC: 2.95 MIL/uL — ABNORMAL LOW (ref 3.87–5.11)
RDW: 14.9 % (ref 11.5–15.5)
WBC: 4 10*3/uL (ref 4.0–10.5)
nRBC: 0 % (ref 0.0–0.2)

## 2018-11-10 LAB — COMPREHENSIVE METABOLIC PANEL
ALT: 17 U/L (ref 0–44)
AST: 22 U/L (ref 15–41)
Albumin: 2.7 g/dL — ABNORMAL LOW (ref 3.5–5.0)
Alkaline Phosphatase: 67 U/L (ref 38–126)
Anion gap: 11 (ref 5–15)
BUN: 7 mg/dL — ABNORMAL LOW (ref 8–23)
CO2: 23 mmol/L (ref 22–32)
Calcium: 9.7 mg/dL (ref 8.9–10.3)
Chloride: 103 mmol/L (ref 98–111)
Creatinine, Ser: 0.57 mg/dL (ref 0.44–1.00)
GFR calc Af Amer: >60 mL/min (ref >60)
GFR calc non Af Amer: >60 mL/min (ref >60)
Glucose, Bld: 94 mg/dL (ref 70–99)
Potassium: 4.4 mmol/L (ref 3.5–5.1)
Sodium: 137 mmol/L (ref 135–145)
Total Bilirubin: 0.4 mg/dL (ref 0.3–1.2)
Total Protein: 6.9 g/dL (ref 6.5–8.1)

## 2018-11-10 NOTE — Evaluation (Signed)
Physical Therapy Assessment and Plan  Patient Details  Name: Destiny [name] MRN: [id number] Date of Birth: [birth year]  PT Diagnosis: Abnormal posture, Abnormality of gait, Hemiplegia non-dominant and Muscle weakness Rehab Potential: Good ELOS: 18-21 days    Today's Date: 11/10/2018 PT Individual Time: 1300-1400 PT Individual Time Calculation (min): 60 min    Problem List:  Patient Active Problem List   Diagnosis Date Noted  . Embolic stroke ([place]) 71/24/5809  . 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 Thrombocytopenia ([place])   . Cerebrovascular accident (CVA) ([place])   . HLD (hyperlipidemia) 11/02/2018  . GERD (gastroesophageal reflux disease) 11/02/2018  . CAD (coronary artery disease) 11/02/2018  . Sleep apnea 11/02/2018  . Hypotension 11/02/2018  . Somnolence 11/02/2018  . Chronic diastolic heart failure ([place]) 11/15/2017  . HTN (hypertension) 11/15/2017  . Hypokalemia 11/05/2017  . Chest pain 02/14/2016  . Small bowel obstruction ([place]) 06/16/2015    Past Medical History:  Past Medical History:  Diagnosis Date  . CAD (coronary artery disease)   . CHF (congestive heart failure) ([place])   . CVA (cerebral infarction)   . Female bladder prolapse   . GERD (gastroesophageal reflux disease)   . Hyperlipidemia   . Hypertension   . Hypokalemia   . Mitral valve disease   . Ovarian ca ([place])   . Sleep apnea   . Stroke [facility])    x3   Past Surgical History:  Past Surgical History:  Procedure Laterality Date  . CARPAL TUNNEL RELEASE Bilateral   . CATARACT EXTRACTION Right 11/22/2017  . CHOLECYSTECTOMY    . COLON SURGERY    . TENNIS ELBOW RELEASE/NIRSCHEL PROCEDURE Left   . 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 She was admitted to [facility] 4/16 with reports of somnolence most of the day. She was found to be hypotensive, dehydrated, significantly hypokalemic with K+ 2.1 and was difficult to arouse. She was started on IV Vanc/Cefepime for sepsis as well as fluids. On 4/19, as noted to have LUE numbness that progressed to LUE/LLE weakness with facial droop and dysarthria. CT head was negative for bleed and CTA negative for LVO. CT abdomen showed post op changes and decrease in fluid along surgical wound. CT chest was showed right pleural effusion with atelectasis and mild left atelectasis.   She was not a candidate for tPA and neurology recommended full work up as well as avoidance of hypotension. MRI brain done revealing multiple small infarcts in right corona radiata, subcortical right parietal lobe and left cerebellum with moderate small vessel disease. 2D echo revealed EF 60-65% with negative agitation study and no wall abnormality. Hem/Onc was consulted for input due to ongoing further drop in platelets to 21,000 thrombocytopenia and questioned SE of chemo v/s SQ heparin. HIT panel negative and to monitor for signs of bleeding as well as counts daily for now --to add plavix once plts >50,000. Blood cultures X 2 and UCS negative therefore antibiotics d/c by 4/22. Stroke felt to be embolic question due to hypercoagulable state from sepsis/cancer--DAPT recommended --to addplavix platelets>100,000. ASA started today as platelets up  to 60,000.Mentation and mood improving but patient continues to be limited by left sided weakness and difficulty following multi-step commands.   Patient transferred to CIR on 11/09/2018 .   Patient currently requires max with mobility secondary to muscle weakness and muscle joint tightness, decreased  cardiorespiratoy endurance, abnormal tone, unbalanced muscle activation and decreased coordination, decreased attention to left, decreased initiation, decreased attention, decreased awareness, decreased problem solving, decreased safety awareness, decreased memory and delayed processing and decreased sitting balance, decreased standing balance, decreased postural control, hemiplegia and decreased balance strategies.  Prior to hospitalization, patient was modified independent  with mobility and lived with Spouse in a [place] home.  Home access is  Level entry.  Patient will benefit from skilled PT intervention to maximize safe functional mobility, minimize fall risk and decrease caregiver burden for planned discharge home with 24 hour assist.  Anticipate patient will benefit from follow up [facility] at discharge.  PT - End of Session Endurance Deficit: Yes PT Assessment Rehab Potential (ACUTE/IP ONLY): Good PT Barriers to Discharge: Decreased caregiver support;Medical stability;Pending chemo/radiation;Insurance for SNF coverage;Wound Care PT Patient demonstrates impairments in the following area(s): Balance;Behavior;Edema;Endurance;Motor;Other (comment);Nutrition;Skin Integrity;Sensory;Safety;Perception;Pain PT Transfers Functional Problem(s): Bed Mobility;Bed to Chair;Car;Furniture;Floor PT Locomotion Functional Problem(s): Ambulation;Wheelchair Mobility;Stairs PT Plan PT Intensity: Minimum of 1-2 x/day ,45 to 90 minutes PT Frequency: 5 out of 7 days PT Duration Estimated Length of Stay: 18-21 days  PT Treatment/Interventions: Ambulation/gait training;Balance/vestibular training;Cognitive remediation/compensation;Community reintegration;Discharge planning;Disease management/prevention;DME/adaptive equipment instruction;Functional electrical stimulation;Functional mobility training;Neuromuscular re-education;Pain management;Patient/family education;Psychosocial support;Skin care/wound  management;Splinting/orthotics;Stair training;Therapeutic Activities;Therapeutic Exercise;UE/LE Coordination activities;Visual/perceptual remediation/compensation;UE/LE Strength taining/ROM;Wheelchair propulsion/positioning PT Transfers Anticipated Outcome(s): Min assist with LRAD  PT Locomotion Anticipated Outcome(s): Supervision assist WC level. min assist ambulatory for short distances.  PT Recommendation Recommendations for Other Services: Neuropsych consult Follow Up Recommendations: Home health PT Patient destination: Home Equipment Recommended: Wheelchair cushion (measurements);Wheelchair (measurements);Rolling walker with 5" wheels  Skilled Therapeutic Intervention Pt received sitting in WC and agreeable to PT. PT instructed patient in PT Evaluation and initiated treatment intervention; see below for results. PT educated patient in [place], rehab potential, rehab goals, and discharge recommendations. Patient returned to room and left sitting in [facility] with call bell in reach and all needs met.       PT Evaluation Precautions/Restrictions Precautions Precaution Comments: Lt hemi, abdominal wound + Rt chest port  General   Vital Signs  Pain Pain Assessment Pain Scale: 0-10 Pain Score: 0-No pain Home Living/Prior Functioning Home Living Available Help at Discharge: Family;Available 24 hours/day Type of Home: Apartment Home Access: Level entry Home Layout: One level Bathroom Shower/Tub: Tub/shower unit;Curtain Armed forces training and education officer: Yes  Lives With: Spouse Prior Function Level of Independence: Needs assistance with homemaking;Needs assistance with ADLs(pert chart, pt used rollator, and per pt she used RW during ADLs) Vision/Perception  Perception Perception: Within Functional Limits(Able to initiate washing Lt side of body and involving L UE during functional tasks. Also able to scan for ADL items placed on Lt side without cues) Praxis Praxis: Intact   Cognition Overall Cognitive Status: Impaired/Different from baseline Arousal/Alertness: Awake/alert Sustained Attention: Appears intact Memory: Impaired Awareness: Impaired Awareness Impairment: Emergent impairment Problem Solving: Impaired Safety/Judgment: Appears intact Sensation Sensation Light Touch: Appears Intact(UEs) Coordination Gross Motor Movements are Fluid and Coordinated: No Fine Motor Movements are Fluid and Coordinated: No Coordination and Movement Description: Affected by Lt hemiparesis. Noted clonus vs tremor activity B LEs Finger Nose Finger Test: Intact Rt. Unable using Lt Motor  Motor Motor: Abnormal postural alignment and control;Abnormal  tone;Hemiplegia Motor - Skilled Clinical Observations: L hemi UE>LE  Mobility Transfers Transfers: Sit to Stand;Stand to Sit;Squat Pivot Transfers(during functional tasks) Sit to Stand: Maximal Assistance - Patient 25-49% Stand to Sit: Maximal Assistance - Patient 25-49% Squat Pivot Transfers: Maximal Assistance - Patient 25-49% Transfer (Assistive device): Rolling walker Locomotion  Gait Ambulation: Yes Gait Assistance: 2 Helpers Gait Distance (Feet): 12 Feet Assistive device: Rolling walker Gait Gait: Yes Gait Pattern: Impaired Gait Pattern: Left genu recurvatum;Lateral hip instability;Decreased trunk rotation;Trunk flexed Stairs / Additional Locomotion Stairs: No Architect: Yes Wheelchair Assistance: Moderate Assistance - Patient 50 - 74% Wheelchair Propulsion: Both upper extremities Wheelchair Parts Management: Needs assistance Distance: 50  Trunk/Postural Assessment  Cervical Assessment Cervical Assessment: Exceptions to WFL(forward head) Thoracic Assessment Thoracic Assessment: Exceptions to WFL(Kyphotic, Lt shoulder depression) Lumbar Assessment Lumbar Assessment: Exceptions to WFL(posterior pelvic tilt) Postural Control Postural Control: Deficits on evaluation Trunk  Control: posterior LOBs EOB during self care Righting Reactions: Delayed;  Balance Balance Balance Assessed: Yes Static Sitting Balance Static Sitting - Level of Assistance: 5: Stand by assistance Dynamic Sitting Balance Dynamic Sitting - Level of Assistance: 3: Mod assist(attempting to doff socks EOB) Static Standing Balance Static Standing - Level of Assistance: 3: Mod assist(UE support on RW) Dynamic Standing Balance Dynamic Standing - Level of Assistance: 2: Max assist(elevating pants over hips) Extremity Assessment  RUE Assessment RUE Assessment: Within Functional Limits LUE Assessment LUE Assessment: Exceptions to [facility] Active Range of Motion (AROM) Comments: 20 degrees shoulder flexion, tended to veer into scaption; 45 degrees shoulder abduction; minimal ER and IR ROM. No active ROM at elbow  General Strength Comments: 1 to 2+ grossly  LUE Body System: Neuro Brunstrum levels for arm and hand: Arm;Hand Brunstrum level for arm: Stage IV Movement is deviating from synergy Brunstrum level for hand: Stage IV Movements deviating from synergies RLE Assessment RLE Assessment: Within Functional Limits General Strength Comments: grossly 4+/5  LLE Assessment LLE Assessment: Exceptions to [facility] General Strength Comments: grossly 4-/5     Refer to Care Plan for Long Term Goals  Recommendations for other services: Neuropsych  Discharge Criteria: Patient will be discharged from PT if patient refuses treatment 3 consecutive times without medical reason, if treatment goals not met, if there is a change in medical status, if patient makes no progress towards goals or if patient is discharged from hospital.  The above assessment, treatment plan, treatment alternatives and goals were discussed and mutually agreed upon: by patient  [name] 11/10/2018, 12:59 PM

## 2018-11-10 NOTE — Evaluation (Addendum)
Occupational Therapy Assessment and Plan  Patient Details  Name: Destiny [name] MRN: [id number] Date of Birth: May 21, 1956  OT Diagnosis: abnormal posture, hemiplegia affecting non-dominant side and muscle weakness (generalized) Rehab Potential: Rehab Potential (ACUTE ONLY): Good ELOS: 3.5 weeks    Today's Date: 11/10/2018 OT Individual Time: 8295-6213 OT Individual Time Calculation (min): 76 min     Problem List:  Patient Active Problem List   Diagnosis Date Noted  . Embolic stroke ([place]) 08/65/7846  . 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 Thrombocytopenia ([place])   . Cerebrovascular accident (CVA) ([place])   . HLD (hyperlipidemia) 11/02/2018  . GERD (gastroesophageal reflux disease) 11/02/2018  . CAD (coronary artery disease) 11/02/2018  . Sleep apnea 11/02/2018  . Hypotension 11/02/2018  . Somnolence 11/02/2018  . Chronic diastolic heart failure ([place]) 11/15/2017  . HTN (hypertension) 11/15/2017  . Hypokalemia 11/05/2017  . Chest pain 02/14/2016  . Small bowel obstruction ([place]) 06/16/2015    Past Medical History:  Past Medical History:  Diagnosis Date  . CAD (coronary artery disease)   . CHF (congestive heart failure) ([place])   . CVA (cerebral infarction)   . Female bladder prolapse   . GERD (gastroesophageal reflux disease)   . Hyperlipidemia   . Hypertension   . Hypokalemia   . Mitral valve disease   . Ovarian ca ([place])   . Sleep apnea   . Stroke [facility])    x3   Past Surgical History:  Past Surgical History:  Procedure Laterality Date  . CARPAL TUNNEL RELEASE Bilateral   . CATARACT EXTRACTION Right 11/22/2017  . CHOLECYSTECTOMY    . COLON SURGERY    . TENNIS ELBOW RELEASE/NIRSCHEL PROCEDURE Left   . 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 She was admitted to [facility] 4/16 with reports of somnolence most of the day. She was found to be hypotensive, dehydrated, significantly hypokalemic with K+ 2.1 and was difficult to arouse. She was started on IV Vanc/Cefepime for sepsis as well as fluids. On 4/19, as noted to have LUE numbness that progressed to LUE/LLE weakness with facial droop and dysarthria. CT head was negative for bleed and CTA negative for LVO. CT abdomen showed post op changes and decrease in fluid along surgical wound. CT chest was showed right pleural effusion with atelectasis and mild left atelectasis.   She was not a candidate for tPA and neurology recommended full work up as well as avoidance of hypotension. MRI brain done revealing multiple small infarcts in right corona radiata, subcortical right parietal lobe and left cerebellum with moderate small vessel disease. 2D echo revealed EF 60-65% with negative agitation study and no wall abnormality. Hem/Onc was consulted for input due to ongoing further drop in platelets to 21,000 thrombocytopenia and questioned SE of chemo v/s SQ heparin. HIT panel negative and to monitor for signs of bleeding as well as counts daily for now --to add plavix once plts >50,000. Blood cultures X 2 and UCS negative therefore antibiotics d/c by 4/22. Stroke felt to be embolic question due to hypercoagulable state from sepsis/cancer--DAPT recommended --to  addplavix platelets>100,000. ASA started today as platelets up to 60,000.Mentation and mood improving but patient continues to be limited by left sided weakness and difficulty following multi-step commands. CIR recommended due to functional deficits.  Patient currently requires max with basic self-care skills  secondary to muscle weakness and muscle paralysis, decreased cardiorespiratoy endurance, abnormal tone, unbalanced muscle activation and decreased coordination, decreased awareness, decreased problem solving, decreased safety awareness and decreased memory and decreased sitting balance, decreased standing balance, decreased postural control and hemiplegia.  Prior to hospitalization, patient could complete BADLs with min.  Patient will benefit from skilled intervention to increase independence with basic self-care skills prior to discharge home with spouse.  Anticipate patient will require 24 hour supervision and minimal physical assistance and follow up home health.  OT - End of Session Endurance Deficit: Yes OT Assessment Rehab Potential (ACUTE ONLY): Good OT Barriers to Discharge: Decreased caregiver support;Medical stability;Incontinence;Wound Care;Lack of/limited family support;Pending chemo/radiation OT Patient demonstrates impairments in the following area(s): Balance;Skin Integrity;Cognition;Endurance;Motor;Safety OT Basic ADL's Functional Problem(s): Grooming;Bathing;Dressing;Toileting OT Advanced ADL's Functional Problem(s): Laundry OT Transfers Functional Problem(s): Toilet;Tub/Shower OT Additional Impairment(s): Fuctional Use of Upper Extremity OT Plan OT Intensity: Minimum of 1-2 x/day, 45 to 90 minutes OT Frequency: 5 out of 7 days OT Duration/Estimated Length of Stay: 3.5 weeks  OT Treatment/Interventions: Balance/vestibular training;Disease mangement/prevention;Neuromuscular re-education;Self Care/advanced ADL retraining;Therapeutic Exercise;Wheelchair propulsion/positioning;UE/LE Strength taining/ROM;Skin care/wound managment;Pain management;DME/adaptive equipment instruction;Cognitive remediation/compensation;Community reintegration;Functional electrical stimulation;Patient/family education;Splinting/orthotics;UE/LE Coordination activities;Therapeutic Activities;Psychosocial  support;Functional mobility training;Discharge planning OT Self Feeding Anticipated Outcome(s): No goal OT Basic Self-Care Anticipated Outcome(s): Supervision-Min A OT Toileting Anticipated Outcome(s): Min A OT Bathroom Transfers Anticipated Outcome(s): Min A OT Recommendation Recommendations for Other Services: Neuropsych consult Patient destination: Home Follow Up Recommendations: Home health OT Equipment Recommended: To be determined   Skilled Therapeutic Intervention Skilled OT session completed with focus on initial evaluation, education on OT role/POC, and establishment of patient-centered goals.   Pt greeted in bed with no c/o pain. Supine<sit from flat bed without bedrail use completed with Max A. She completed bathing/dressing (EOB sit<stand with RW) and squat pivot recliner transfer during session (with Max A). While EOB, pt had 4 posterior LOBs needing Max A to recover due to UB weakness. HOH to use Lt hand functionally, with pt exhibiting minimal proximal activation. Though she has a weak hand grasp, she needed assist to hold wash cloth with Lt and also to stabilize deodorant stick. Active assist for reaching wash basin with Lt to squeeze out wash cloths. Pt was mindful of trying to reach for items with Lt, and attending to Lt side to scan for ADL items. Sit<stand with Max A and max multimodal cues for upright alignment due to kyphotic posture. Noted Lt knee hyperextended in standing. Unable to maintain Lt hand grasp on walker, so her limb was supported on OT's shoulder while pants were elevated. She needed Max A for LB overall due to decreased LE flexibility, and posterior LOBs when attempting to meet task demands. Max A for overhead shirt. Squat pivot<recliner completed with Max A. Due to small fit of x-large paper clothing, clothing was taken off of her and she donned clean hospital gown instead. Pt was repositioned for comfort in recliner with Max A for recripricol scooting. Pt left  with call bell, safety belt fastened, and LEs elevated at session exit.      OT Evaluation Precautions/Restrictions  Precautions Precaution Comments: Lt hemi, abdominal wound + Rt chest port  General Chart Reviewed: Yes Family/Caregiver Present: No  Home Living/Prior Functioning Home Living Family/patient expects to be discharged to:: Private residence Living Arrangements: Spouse/significant other Available Help at Discharge: Family, Available 24 hours/day Type of Home: Apartment Home Access: Level entry Home Layout: One level Bathroom Shower/Tub: Tub/shower unit, Industrial/product designer: Yes  Lives With: Spouse IADL History Homemaking Responsibilities: Yes Meal Prep Responsibility: No Laundry Responsibility: Primary(per pt report) Cleaning Responsibility: No Bill Paying/Finance Responsibility: No Shopping Responsibility: No Homemaking Comments: Spouse assisted with most IADLs per pt/chart. 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 [place]: [place]: Needs assistance with homemaking, Needs assistance with ADLs(pert chart, pt used rollator, and per pt she used RW during ADLs) ADL ADL Grooming: Supervision/safety Where Assessed-Grooming: Chair Upper Body Bathing: Minimal assistance Where Assessed-Upper Body Bathing: Edge of bed Lower Body Bathing: Maximal assistance Where Assessed-Lower Body Bathing: Edge of bed Upper Body Dressing: Maximal assistance Where Assessed-Upper Body Dressing: Edge of bed Lower Body Dressing: Maximal assistance Where Assessed-Lower Body Dressing: Edge of bed Toileting: Not assessed Toilet Transfer: Not assessed Social research officer, government: Not assessed Vision Baseline Vision/History: Wears glasses Wears Glasses: Reading only Patient Visual Report:  No change from baseline Vision Assessment?: No apparent visual deficits Perception  Perception: Within Functional Limits(Able to initiate washing Lt side of body and involving L UE during functional tasks. Also able to scan for ADL items placed on Lt side without cues) Praxis Praxis: Intact Cognition Overall Cognitive Status: Impaired/Different from baseline Arousal/Alertness: Awake/alert Orientation Level: Person;Place;Situation Person: Oriented Place: Oriented Situation: Oriented Year: 2020 Month: August Day of Week: Incorrect Memory: Impaired Memory Impairment: Decreased short term memory;Decreased recall of new information Decreased Short Term Memory: Functional basic;Verbal basic Immediate Memory Recall: Sock;Blue;Bed Memory Recall: Sock;Blue;Bed Memory Recall Sock: With Cue Memory Recall Blue: With Cue Memory Recall Bed: With Cue Attention: Selective Focused Attention: Appears intact Sustained Attention: Appears intact Selective Attention: Impaired Selective Attention Impairment: Functional basic Awareness: Impaired Awareness Impairment: Emergent impairment Problem Solving: Impaired Problem Solving Impairment: Functional basic Safety/Judgment: Appears intact Sensation Sensation Light Touch: Appears Intact(UEs) Coordination Gross Motor Movements are Fluid and Coordinated: No Fine Motor Movements are Fluid and Coordinated: No Coordination and Movement Description: Affected by Lt hemiparesis. Noted clonus vs tremor activity B LEs Finger Nose Finger Test: Intact Rt. Unable using Lt Motor  Motor Motor: Abnormal postural alignment and control;Abnormal tone;Hemiplegia;Clonus Mobility  Transfers Sit to Stand: Maximal Assistance - Patient 25-49% Stand to Sit: Maximal Assistance - Patient 25-49%  Trunk/Postural Assessment  Cervical Assessment Cervical Assessment: Exceptions to WFL(forward head) Thoracic Assessment Thoracic Assessment: Exceptions to WFL(Kyphotic, Lt  shoulder depression) Lumbar Assessment Lumbar Assessment: Exceptions to WFL(posterior pelvic tilt) Postural Control Postural Control: Deficits on evaluation Trunk Control: posterior LOBs EOB during self care Righting Reactions: Delayed; unable to correct without Max A  Balance Balance Balance Assessed: Yes Dynamic Sitting Balance Dynamic Sitting - Level of Assistance: 3: Mod assist(attempting to doff socks EOB) Dynamic Standing Balance Dynamic Standing - Level of Assistance: 2: Max assist(elevating pants over hips) Extremity/Trunk Assessment RUE Assessment RUE Assessment: Within Functional Limits LUE Assessment LUE Assessment: Exceptions to [facility] Active Range of Motion (AROM) Comments: 20 degrees shoulder flexion, tended to veer into scaption; 45 degrees shoulder abduction; minimal ER and IR ROM. No active ROM at elbow  General Strength Comments: 1 to 2+ grossly  LUE Body System: Neuro Brunstrum levels  for arm and hand: Arm;Hand Brunstrum level for arm: Stage IV Movement is deviating from synergy Brunstrum level for hand: Stage IV Movements deviating from synergies     Refer to Care Plan for Long Term Goals  Recommendations for other services: Neuropsych   Discharge Criteria: Patient will be discharged from OT if patient refuses treatment 3 consecutive times without medical reason, if treatment goals not met, if there is a change in medical status, if patient makes no progress towards goals or if patient is discharged from hospital.  The above assessment, treatment plan, treatment alternatives and goals were discussed and mutually agreed upon: by patient  Skeet Simmer 11/10/2018, 12:46 PM

## 2018-11-10 NOTE — Progress Notes (Signed)
[facility] PHYSICAL MEDICINE & REHABILITATION PROGRESS NOTE   Subjective/Complaints: Patient sitting in recliner no complaints Looked with nursing at abdominal wound. Patient denies any abdominal pain Review of systems denies chest pain shortness of breath nausea vomiting diarrhea   Objective:   No results found. Recent Labs    11/08/18 0355 11/09/18 0328  WBC 2.8* 3.5*  HGB 7.9* 8.3*  HCT 24.2* 25.0*  PLT 37* 61*   Recent Labs    11/08/18 0355 11/09/18 0328  NA 138 137  K 3.6 4.2  CL 107 107  CO2 23 23  GLUCOSE 97 98  BUN 9 9  CREATININE 0.53 0.46  CALCIUM 9.1 9.4    Intake/Output Summary (Last 24 hours) at 11/10/2018 0558 Last data filed at 11/09/2018 1829 Gross per 24 hour  Intake 100 ml  Output -  Net 100 ml     Physical Exam: Vital Signs Blood pressure 125/85, pulse 98, temperature 98.8 F (37.1 C), temperature source Oral, resp. rate 18, height 5\' 2"  (1.575 m), weight 91.3 kg, SpO2 96 %.   General: No acute distress Mood and affect are appropriate Heart: Regular rate and rhythm no rubs murmurs or extra sounds Lungs: Clear to auscultation, breathing unlabored, no rales or wheezes Abdomen: Positive bowel sounds, soft nontender to palpation, nondistended Extremities: No clubbing, cyanosis, or edema Skin: No evidence of breakdown, no evidence of rash, midline abdominal scar his pink granular tissue mainly epithelialized Neurologic: Cranial nerves II through XII intact, motor strength is 5/5 in right and to minus left deltoid, bicep, tricep, grip, hip flexor, knee extensors, ankle dorsiflexor and plantar flexor Visual fields are intact confrontation testing no evidence of left neglect Cerebellar exam normal right finger to nose to finger as well as heel to shin in  upper and lower extremities, unable to perform left side due to weakness Musculoskeletal: Full range of motion in all 4 extremities. No joint swelling     Assessment/Plan: 1. Functional  deficits secondary to right corona radiata infarct with left hemiparesis which require 3+ hours per day of interdisciplinary therapy in a comprehensive inpatient rehab setting.  Physiatrist is providing close team supervision and 24 hour management of active medical problems listed below.  Physiatrist and rehab team continue to assess barriers to discharge/monitor patient progress toward functional and medical goals  Care Tool:  Bathing              Bathing assist       Upper Body Dressing/Undressing Upper body dressing        Upper body assist      Lower Body Dressing/Undressing Lower body dressing            Lower body assist       Toileting Toileting    Toileting assist Assist for toileting: Moderate Assistance - Patient 50 - 74%     Transfers Chair/bed transfer  Transfers assist  Chair/bed transfer activity did not occur: Safety/medical concerns        Locomotion Ambulation   Ambulation assist              Walk 10 feet activity   Assist           Walk 50 feet activity   Assist           Walk 150 feet activity   Assist           Walk 10 feet on uneven surface  activity   Assist  Wheelchair     Assist               Wheelchair 50 feet with 2 turns activity    Assist            Wheelchair 150 feet activity     Assist          Medical Problem List and Plan: 1.Functional deficits and left hemiparesissecondary to embolic cva involving right corona radiata, right subcorital parietal lobe, left cerebellum CIR PT, OT, speech eval's 2. Antithrombotics: -DVT/anticoagulation:Mechanical:Sequential compression devices, below kneeBilateral lower extremities -antiplatelet therapy: Low dose ASA resumed today.Plan is toadd Plavix once plts >100,000 3. Pain Management:Tylenol prn 4. Mood:LCSW to follow for evaluation and support.  -antipsychotic agents: N/A 5. Neuropsych: This patientiscapable of making decisions on herown behalf. 6. Skin/Wound Care:Monitor abdominal wound for recurrant drainage as mobility improves. Closed, granulation tissue is dry continue wet-to-dry dressings until scabs are removed 7. Fluids/Electrolytes/Nutrition:follow I's and O's. Check labs tomorrow 8. H/o Ovarian CA: Chemo on hold. 9. Reactive depression/Anxiety: ON Effexor and Paxil. 10. Hyperactive bladder: Continue ditropan. 11. Thrombocytopenia: Continue to monitor platelets with daily CBC. 12. H/o systolic CHF: Continue daily weights. 1800 cc FR. ON spironolactone and Crestor. Off Demadex and metoprolol at this time.  13. Pancytopenia: Monitor for signs of bleeding. H/H, plts and WBC recovering slowly. -CBC daily.  Platelets up to 61K on  11/09/2018 14. Hypokalemia: Improved with supplementation.  4.2 on 11/09/2018    LOS: 1 days A FACE TO Joes E Kirsteins 11/10/2018, 5:58 AM

## 2018-11-10 NOTE — Progress Notes (Signed)
Initial Nutrition Assessment  DOCUMENTATION CODES:  Obesity unspecified, Severe malnutrition in context of acute illness/injury  INTERVENTION:  Continue Boost Breeze po TID, each supplement provides 250 kcal and 9 grams of protein  Add Magic cup TID with meals, each supplement provides 290 kcal and 9 grams of protein  Please note, pt has lost >13% of her bw in < 3 months.   NUTRITION DIAGNOSIS:  Severe Malnutrition related to lethargy/confusion, acute illness(Complications stemming from surgery done for cancer) as evidenced by loss of >10% bw in <3 months and an estimated intake that has met </= to 75% of needs for >/= to 1 month  GOAL:  Patient will meet greater than or equal to 90% of their needs  MONITOR:  PO intake, I & O's, Labs, Diet advancement, Skin, Weight trends, Supplement acceptance  REASON FOR ASSESSMENT:  Malnutrition Screening Tool    ASSESSMENT:  64 y/o female w/ pmhx CAD, CHF, MVP, OSA, CVAs, malnutrition, depression, high grade adenocarcinoma of fallopian tube per ex lap 08/21/18 c/b bowel perf and repeat admission 4/8-4/12 for wound infection. Was admitted to [facility] (4/16-4/24) w/ somnolence and eventual MRI showed several small infarcts. Transferred to CIR for intensive rehab.   RD operating remotely on weekends. Spoke with RD who had followed patient at [facility]. Patient is not appropriate for phone conversation.   Patient did not eat well for the length of her [facility] hospitalization. She had one meal intake documented at 20%. The rest of meals were either "0%" or "bites and sips". Pt also did not do well with the oral supplements while there. She did not like the Ensure Enlives. She did much better with Boost Breeze.    Reviewing Care Everywhere, pt has lost a substantial amount of weight over the past 3 months. She appears to have been stable at 225-230 lbs between this past October up until her surgery in February. Her wt at end of March/start of march was 200-205 lbs.  And as of today, she has now fallen to 194 lbs. She has lost 30-35 lbs in <3 months, a loss of >13% bw. This meets malnutrition criteria. Given her generalized edema and low albumin, further wt loss will likely become evident as fluid is mobilized.  Since transfer to CIR, while intake appears to have slightly improved, it is still suboptimal. She has eaten 25-50% of her meals since she was admitted. Will continue Boost breeze tid and call the service response center and ask to add magic cups to all pts trays.   Labs: Albumin:2.7, Hgb:8.7 Meds: Colace, MVI with min, KCl, Aldactone, kcl,   Recent Labs  Lab 11/06/18 0559 11/07/18 0524 11/08/18 0355 11/09/18 0328 11/10/18 0512  NA 138 136 138 137 137  K 3.5 3.1* 3.6 4.2 4.4  CL 105 104 107 107 103  CO2 25 23 23 23 23   BUN 8 10 9 9  7*  CREATININE 0.63 0.68 0.53 0.46 0.57  CALCIUM 9.1 9.1 9.1 9.4 9.7  MG 1.5* 2.0  --   --   --   GLUCOSE 98 99 97 98 94   NUTRITION - FOCUSED PHYSICAL EXAM: Unable to conduct  Diet Order:   Diet Order            DIET DYS 3 Room service appropriate? Yes; Fluid consistency: Thin; Fluid restriction: 1200 mL Fluid  Diet effective now             EDUCATION NEEDS:  No education needs have been identified at this  time  Skin:  Abrasions to BLE,  MASD to buttocks/groin, Surgical incision to abdomen.   Last BM:  4/24  Height:  Ht Readings from Last 1 Encounters:  11/09/18 5' 2"  (1.575 m)   Weight:  Wt Readings from Last 1 Encounters:  11/10/18 88.1 kg   Wt Readings from Last 10 Encounters:  11/10/18 88.1 kg  11/02/18 90.9 kg  08/17/18 103.9 kg  05/25/18 106.4 kg  02/23/18 101.8 kg  01/28/18 102.1 kg  11/24/17 99.4 kg  11/15/17 100.4 kg  11/14/17 101 kg  11/06/17 96.8 kg   Ideal Body Weight:  50 kg  BMI:  Body mass index is 35.52 kg/m.  Estimated Nutritional Needs:  Kcal:  1600-1850 (19-21 kcal/kg bw) Protein:  80-90g Pro (20% energy) Fluid:  1.6-1.8L (1 ml/kcal)  Burtis Junes RD,  LDN, CNSC Clinical Nutrition Available Tues-Sat via Pager: [phone] 11/10/2018 3:44 PM

## 2018-11-10 NOTE — Evaluation (Signed)
Speech Language Pathology Assessment and Plan  Patient Details  Name: Destiny [name] MRN: [id number] Date of Birth: 1955-07-30  SLP Diagnosis: Cognitive Impairments;Dysphagia  Rehab Potential: Good ELOS: 3 weeks    Today's Date: 11/10/2018 SLP Individual Time: 5885-0277 SLP Individual Time Calculation (min): 60 min   Problem List:  Patient Active Problem List   Diagnosis Date Noted  . Embolic stroke ([place]) 41/28/7867  . 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 Thrombocytopenia ([place])   . Cerebrovascular accident (CVA) ([place])   . HLD (hyperlipidemia) 11/02/2018  . GERD (gastroesophageal reflux disease) 11/02/2018  . CAD (coronary artery disease) 11/02/2018  . Sleep apnea 11/02/2018  . Hypotension 11/02/2018  . Somnolence 11/02/2018  . Chronic diastolic heart failure ([place]) 11/15/2017  . HTN (hypertension) 11/15/2017  . Hypokalemia 11/05/2017  . Chest pain 02/14/2016  . Small bowel obstruction ([place]) 06/16/2015   Past Medical History:  Past Medical History:  Diagnosis Date  . CAD (coronary artery disease)   . CHF (congestive heart failure) ([place])   . CVA (cerebral infarction)   . Female bladder prolapse   . GERD (gastroesophageal reflux disease)   . Hyperlipidemia   . Hypertension   . Hypokalemia   . Mitral valve disease   . Ovarian ca ([place])   . Sleep apnea   . Stroke [facility])    x3   Past Surgical History:  Past Surgical History:  Procedure Laterality Date  . CARPAL TUNNEL RELEASE Bilateral   . CATARACT EXTRACTION Right 11/22/2017  . CHOLECYSTECTOMY    . COLON SURGERY    . TENNIS ELBOW RELEASE/NIRSCHEL PROCEDURE Left   . 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 She was admitted to [facility] 4/16 with reports of somnolence most of the day. She was found to be hypotensive, dehydrated, significantly hypokalemic with K+ 2.1 and was difficult to arouse. She was started on IV Vanc/Cefepime for sepsis as well as fluids. On 4/19, as noted to have LUE numbness that progressed to LUE/LLE weakness with facial droop and dysarthria. CT head was negative for bleed and CTA negative for LVO. CT abdomen showed post op changes and decrease in fluid along surgical wound. CT chest was showed right pleural effusion with atelectasis and mild left atelectasis. She was not a candidate for tPA and neurology recommended full work up as well as avoidance of hypotension. MRI brain done revealing multiple small infarcts in right corona radiata, subcortical right parietal lobe and left cerebellum with moderate small vessel disease. 2D echo revealed EF 60-65% with negative agitation study and no wall abnormality. Hem/Onc was consulted for input due to ongoing further drop in platelets to 21,000 thrombocytopenia and questioned SE of chemo v/s SQ heparin. HIT panel negative and to monitor for signs of bleeding as well as counts daily for now --to add plavix once plts >50,000. Blood cultures X 2 and UCS negative therefore antibiotics d/c by 4/22. Stroke felt to be embolic question due to hypercoagulable state from sepsis/cancer--DAPT recommended --to addplavix platelets>100,000. ASA started today as platelets up to 60,000.Mentation and mood improving but patient  continues to be limited by left sided weakness and difficulty following multi-step commands. CIR recommended due to functional deficits 11/09/18.  Patient administered the Cognistat and demonstrates mild-moderate cognitive impairments characterized by delayed processing, impaired  selective attention, impaired functional problem solving, impaired intellectual awareness of deficits, impaired immediate and short-term recall and left inattention which impacts her safety with functional and familiar tasks. Patient had a flat affect throughout the evaluation and required encouragement for social engagement. Patient consumed breakfast meal of Dys. 3 textures with thin liquids via straw. No overt s/s of aspiration were noted but patient did demonstrate prolonged mastication and mild oral residue that required multiple liquid washes to clear. Recommend patient continue current diet to maximize safety due to cognitive impairments at this time. Patient would benefit from skilled SLP intervention to maximize her cognitive and swallowing function prior to discharge.    Skilled Therapeutic Interventions          Administered a cognitive-linguistic evaluation and BSE, please see above for details. Educated patient in regards to current swallowing and cognitive impairments and goals of skilled SLP intervention, she verbalized understanding and agreement.   SLP Assessment  Patient will need skilled Speech Lanaguage Pathology Services during CIR admission    Recommendations  SLP Diet Recommendations: Dysphagia 3 (Mech soft);Thin Liquid Administration via: Cup;Straw Medication Administration: Whole meds with puree(or thin, per patient preference ) Supervision: Patient able to self feed;Intermittent supervision to cue for compensatory strategies Compensations: Minimize environmental distractions;Slow rate;Small sips/bites;Follow solids with liquid Postural Changes and/or Swallow Maneuvers: Seated upright 90 degrees Oral Care Recommendations: Oral care BID Recommendations for Other Services: Neuropsych consult Patient destination: Home Follow up Recommendations: 24 hour supervision/assistance;Home Health SLP Equipment Recommended: None recommended by SLP    SLP Frequency 3 to 5 out of 7  days   SLP Duration  SLP Intensity  SLP Treatment/Interventions 3 weeks   Minumum of 1-2 x/day, 30 to 90 minutes  Cognitive remediation/compensation;Environmental controls;Internal/external aids;Therapeutic Activities;Patient/family education;Functional tasks;Dysphagia/aspiration precaution training;Cueing hierarchy    Pain No/Denies Pain   Short Term Goals: Week 1: SLP Short Term Goal 1 (Week 1): Patient will consume trials of regular textures with efficient mastication and complete oral clearance over 2 sessions with supervision verbal cues prior to upgrade.  SLP Short Term Goal 2 (Week 1): Patient will demonstrate functional problem solving for basic and familiar tasks with Min A verbal cues.  SLP Short Term Goal 3 (Week 1): Patient will attend to left field of enviornment/body during functional tasks with supervision verbal cues.  SLP Short Term Goal 4 (Week 1): Patient will recall new, daily information with use of external aids with Min A verbal and visual cues.  SLP Short Term Goal 5 (Week 1): Patient will demonstrate selective attention to functional tasks for ~10 minutes with Min A verbal cues for redirection in a mildly distracting enviornment.  SLP Short Term Goal 6 (Week 1): Patient will identify 2 physical and 2 cognitive deficits with Min A verbal cues.   Refer to Care Plan for Long Term Goals  Recommendations for other services: Neuropsych  Discharge Criteria: Patient will be discharged from SLP if patient refuses treatment 3 consecutive times without medical reason, if treatment goals not met, if there is a change in medical status, if patient makes no progress towards goals or if patient is discharged from hospital.  The above assessment, treatment plan, treatment alternatives and goals were discussed and mutually agreed upon: by patient  Adama Ivins 11/10/2018, 2:29 PM

## 2018-11-11 LAB — CBC
HCT: 26.2 % — ABNORMAL LOW (ref 36.0–46.0)
Hemoglobin: 8.5 g/dL — ABNORMAL LOW (ref 12.0–15.0)
MCH: 29.4 pg (ref 26.0–34.0)
MCHC: 32.4 g/dL (ref 30.0–36.0)
MCV: 90.7 fL (ref 80.0–100.0)
Platelets: 149 10*3/uL — ABNORMAL LOW (ref 150–400)
RBC: 2.89 MIL/uL — ABNORMAL LOW (ref 3.87–5.11)
RDW: 14.7 % (ref 11.5–15.5)
WBC: 4.1 10*3/uL (ref 4.0–10.5)
nRBC: 0 % (ref 0.0–0.2)

## 2018-11-11 MED ORDER — CLOPIDOGREL BISULFATE 75 MG PO TABS
75.0000 mg | ORAL_TABLET | Freq: Every day | ORAL | Status: DC
  Administered 2018-11-12 – 2018-11-29 (×18): 75 mg via ORAL
  Filled 2018-11-11 (×18): qty 1

## 2018-11-11 NOTE — IPOC Note (Signed)
Overall Plan of Care [facility]) Patient Details Name: Destiny [name] MRN: [id number] DOB: 05-Jan-1956  Admitting Diagnosis: <principal problem not specified>  Hospital Problems: Active Problems:   Embolic stroke [facility])     Functional Problem List: Nursing Bladder, Bowel, Endurance, Medication Management, Safety, Pain, Skin Integrity  PT Balance, Behavior, Edema, Endurance, Motor, Other (comment), Nutrition, Skin Integrity, Sensory, Safety, Perception, Pain  OT Balance, Skin Integrity, Cognition, Endurance, Motor, Safety  SLP Cognition, Nutrition  TR         Basic ADL's: OT Grooming, Bathing, Dressing, Toileting     Advanced  ADL's: OT Laundry     Transfers: PT Bed Mobility, Bed to Chair, Car, [employer], Futures trader, Metallurgist: PT Ambulation, Emergency planning/management officer, Stairs     Additional Impairments: OT Fuctional Use of Upper Extremity  SLP Swallowing, Social Cognition   Social Interaction, Problem Solving, Memory, Attention, Awareness  TR      Anticipated Outcomes Item Anticipated Outcome  Self Feeding No goal  Swallowing  Mod I   Basic self-care  Supervision-Min A  Toileting  Min A   Bathroom Transfers Min A   Bowel/Bladder  Min assist  Transfers  Min assist with LRAD   Locomotion  Supervision assist WC level. min assist ambulatory for short distances.   Communication     Cognition  Supervision   Pain  < 4  Safety/Judgment  Mod I assist   Therapy Plan: PT Intensity: Minimum of 1-2 x/day ,45 to 90 minutes PT Frequency: 5 out of 7 days PT Duration Estimated Length of Stay: 18-21 days  OT Intensity: Minimum of 1-2 x/day, 45 to 90 minutes OT Frequency: 5 out of 7 days OT Duration/Estimated Length of Stay: 3.5 weeks  SLP Intensity: Minumum of 1-2 x/day, 30 to 90 minutes SLP Frequency: 3 to 5 out of 7 days SLP Duration/Estimated Length of Stay: 3 weeks   Due to the current state of emergency, patients may not be receiving their  3-hours of Medicare-mandated therapy.   Team Interventions: Nursing Interventions Patient/Family Education, Bowel Management, Bladder Management, Disease Management/Prevention, Medication Management, Skin Care/Wound Management  PT interventions Ambulation/gait training, Balance/vestibular training, Cognitive remediation/compensation, Community reintegration, Discharge planning, Disease management/prevention, DME/adaptive equipment instruction, Functional electrical stimulation, Functional mobility training, Neuromuscular re-education, Pain management, Patient/family education, Psychosocial support, Skin care/wound management, Splinting/orthotics, Stair training, Therapeutic Activities, Therapeutic Exercise, UE/LE Coordination activities, Visual/perceptual remediation/compensation, UE/LE Strength taining/ROM, Wheelchair propulsion/positioning  OT Interventions Balance/vestibular training, Disease mangement/prevention, Neuromuscular re-education, Self Care/advanced ADL retraining, Therapeutic Exercise, Wheelchair propulsion/positioning, UE/LE Strength taining/ROM, Skin care/wound managment, Pain management, DME/adaptive equipment instruction, Cognitive remediation/compensation, Community reintegration, Functional electrical stimulation, Patient/family education, Splinting/orthotics, UE/LE Coordination activities, Therapeutic Activities, Psychosocial support, Functional mobility training, Discharge planning  SLP Interventions Cognitive remediation/compensation, Environmental controls, Internal/external aids, Therapeutic Activities, Patient/family education, Functional tasks, Dysphagia/aspiration precaution training, Cueing hierarchy  TR Interventions    SW/CM Interventions Discharge Planning, Psychosocial Support, Patient/Family Education   Barriers to Discharge MD  Medical stability and Pending chemo/radiation  Nursing Wound Care    PT Decreased caregiver support, Medical stability, Pending  chemo/radiation, Insurance for SNF coverage, Wound Care    OT Decreased caregiver support, Medical stability, Incontinence, Wound Care, Lack of/limited family support, Pending chemo/radiation    SLP      SW       Team Discharge Planning: Destination: PT-Home ,OT- Home , SLP-Home Projected Follow-up: PT-Home health PT, OT-  Home health OT, SLP-24 hour supervision/assistance, Home Health SLP Projected Equipment Needs: PT-Wheelchair  cushion (measurements), Wheelchair (measurements), Rolling walker with 5" wheels, OT- To be determined, SLP-None recommended by SLP Equipment Details: PT- , OT-  Patient/family involved in discharge planning: PT- Patient,  OT-Patient, SLP-Patient  MD ELOS: 18-21d Medical Rehab Prognosis:  Good Assessment:   63 year old female with history of CAD, MVP, OSA, prior strokes, malnutrition, depression, 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 She was admitted to [facility] 4/16 with reports of somnolence most of the day. She was found to be hypotensive, dehydrated, significantly hypokalemic with K+ 2.1 and was difficult to arouse. She was started on IV Vanc/Cefepime for sepsis as well as fluids. On 4/19, as noted to have LUE numbness that progressed to LUE/LLE weakness with facial droop and dysarthria. CT head was negative for bleed and CTA negative for LVO. CT abdomen showed post op changes and decrease in fluid along surgical wound. CT chest was showed right pleural effusion with atelectasis and mild left atelectasis.   She was not a candidate for tPA and neurology recommended full work up as well as avoidance of hypotension. MRI brain done revealing multiple small infarcts in right corona  radiata, subcortical right parietal lobe and left cerebellum with moderate small vessel disease. 2D echo revealed EF 60-65% with negative agitation study and no wall abnormality. Hem/Onc was consulted for input due to ongoing further drop in platelets to 21,000 thrombocytopenia and questioned SE of chemo v/s SQ heparin. HIT panel negative and to monitor for signs of bleeding as well as counts daily for now --to add ASA once plts >50,000. Blood cultures X 2 and UCS negative therefore antibiotics d/c by 4/22. Stroke felt to be embolic question due to hypercoagulable state from sepsis/cancer--DAPT recommended --to addplavix platelets>100,000. ASA startedas platelets up to 60,000   Now requiring 24/7 Rehab RN,MD, as well as CIR level PT, OT and SLP.  Treatment team will focus on ADLs and mobility with goals set at See Team Conference Notes for weekly updates to the plan of care

## 2018-11-11 NOTE — Plan of Care (Signed)
  Problem: RH BLADDER ELIMINATION Goal: RH STG MANAGE BLADDER WITH ASSISTANCE Description STG Manage Bladder With min Assistance  Outcome: Not Progressing; incontinence   Problem: RH BOWEL ELIMINATION Goal: RH STG MANAGE BOWEL WITH ASSISTANCE Description STG Manage Bowel with min Assistance.  Outcome: Not Progressing; LBM 4/24

## 2018-11-11 NOTE — Progress Notes (Signed)
[facility] PHYSICAL MEDICINE & REHABILITATION PROGRESS NOTE   Subjective/Complaints: No complaints. Review of systems denies chest pain shortness of breath nausea vomiting diarrhea   Objective:   No results found. Recent Labs    11/10/18 0512 11/11/18 0700  WBC 4.0 4.1  HGB 8.7* 8.5*  HCT 27.0* 26.2*  PLT 95* 149*   Recent Labs    11/09/18 0328 11/10/18 0512  NA 137 137  K 4.2 4.4  CL 107 103  CO2 23 23  GLUCOSE 98 94  BUN 9 7*  CREATININE 0.46 0.57  CALCIUM 9.4 9.7    Intake/Output Summary (Last 24 hours) at 11/11/2018 1123 Last data filed at 11/11/2018 0755 Gross per 24 hour  Intake 360 ml  Output -  Net 360 ml     Physical Exam: Vital Signs Blood pressure 109/78, pulse 96, temperature 98.1 F (36.7 C), resp. rate 16, height _0  (1.575 m), weight 87.4 kg, SpO2 97 %.   General: No acute distress Mood and affect are appropriate Heart: Regular rate and rhythm no rubs murmurs or extra sounds Lungs: Clear to auscultation, breathing unlabored, no rales or wheezes Abdomen: Positive bowel sounds, soft nontender to palpation, nondistended Extremities: No clubbing, cyanosis, or edema Skin: No evidence of breakdown, no evidence of rash, midline abdominal scar his pink granular tissue mainly epithelialized Neurologic: Cranial nerves II through XII intact, motor strength is 5/5 in right and to minus left deltoid, bicep, tricep, grip, hip flexor, knee extensors, ankle dorsiflexor and plantar flexor Visual fields are intact confrontation testing no evidence of left neglect Cerebellar exam normal right finger to nose to finger as well as heel to shin in  upper and lower extremities, unable to perform left side due to weakness Musculoskeletal: Full range of motion in all 4 extremities. No joint swelling     Assessment/Plan: 1. 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  Physiatrist is providing close team supervision and 24 hour management of active medical problems listed below.  Physiatrist and rehab team continue to assess barriers to discharge/monitor patient progress toward functional and medical goals  Care Tool:  Bathing    Body parts bathed by patient: Chest, Abdomen, Right upper leg, Left upper leg, Face   Body parts bathed by helper: Right arm, Left arm, Front perineal area, Buttocks, Right lower leg, Left lower leg     Bathing assist Assist Level: Maximal Assistance - Patient 24 - 49%     Upper Body Dressing/Undressing Upper body dressing   What is the patient wearing?: Pull over shirt    Upper body assist Assist Level: Maximal Assistance - Patient 25 - 49%    Lower Body Dressing/Undressing Lower body dressing      What is the patient wearing?: Incontinence brief     Lower body assist Assist for lower body dressing: Maximal Assistance - Patient 25 - 49%     Toileting Toileting Toileting Activity did not occur (Clothing management and hygiene only): N/A (no void or bm)  Toileting assist Assist for toileting: Moderate Assistance - Patient 50 - 74%     Transfers Chair/bed transfer  Transfers assist  Chair/bed transfer activity did not occur: Safety/medical concerns  Chair/bed transfer assist level: Maximal Assistance - Patient 25 - 49%     Locomotion Ambulation   Ambulation assist      Assist level: 2 helpers Assistive device: Walker-rolling  Max distance: 57f   Walk 10 feet activity   Assist     Assist level: 2 helpers Assistive device: Walker-rolling   Walk 50 feet activity   Assist Walk 50 feet with 2 turns activity did not occur: Safety/medical concerns         Walk 150 feet activity   Assist Walk 150 feet activity did not occur: Safety/medical concerns         Walk 10 feet on uneven surface  activity   Assist Walk 10 feet on  uneven surfaces activity did not occur: Safety/medical concerns         Wheelchair     Assist   Type of Wheelchair: Manual    Wheelchair assist level: Moderate Assistance - Patient 50 - 74% Max wheelchair distance: 50    Wheelchair 50 feet with 2 turns activity    Assist        Assist Level: Moderate Assistance - Patient 50 - 74%   Wheelchair 150 feet activity     Assist Wheelchair 150 feet activity did not occur: Safety/medical concerns        Medical Problem List and Plan: 1.Functional deficits and left hemiparesissecondary to embolic cva involving right corona radiata, right subcorital parietal lobe, left cerebellum CIR PT, OT, speech eval's 2. Antithrombotics: -DVT/anticoagulation:Mechanical:Sequential compression devices, below kneeBilateral lower extremities -antiplatelet therapy: Low dose ASA resumed today.Plan is toadd Plavix once plts >100,000, platelets are now 140 K 3. Pain Management:Tylenol prn 4. Mood:LCSW to follow for evaluation and support. -antipsychotic agents: N/A 5. Neuropsych: This patientiscapable of making decisions on herown behalf. 6. Skin/Wound Care:Monitor abdominal wound for recurrant drainage as mobility improves. Closed, granulation tissue is dry continue wet-to-dry dressings until scabs are removed 7. Fluids/Electrolytes/Nutrition:follow I's and O's. Check labs tomorrow 8. H/o Ovarian CA: Chemo on hold. 9. Reactive depression/Anxiety: ON Effexor and Paxil. 10. Hyperactive bladder: Continue ditropan. 11. Thrombocytopenia: Continue to monitor platelets with daily CBC. 12. H/o systolic CHF: Continue daily weights. 1800 cc FR. ON spironolactone and Crestor. Off Demadex and metoprolol at this time.  Filed Weights   11/09/18 1501 11/10/18 0614 11/11/18 0622  Weight: 91.3 kg 88.1 kg 87.4 kg  Trending down, check be met in a.m. 13. Pancytopenia: Improving as above 14.  Hypokalemia: Improved with supplementation.  4.2 on 11/09/2018    LOS: 2 days A FACE TO FACE EVALUATION WAS PERFORMED  ACharlett Blake4/26/2020, 11:23 AM

## 2018-11-12 ENCOUNTER — Inpatient Hospital Stay (HOSPITAL_COMMUNITY): Payer: Self-pay

## 2018-11-12 ENCOUNTER — Inpatient Hospital Stay (HOSPITAL_COMMUNITY): Payer: Self-pay | Admitting: Occupational Therapy

## 2018-11-12 ENCOUNTER — Inpatient Hospital Stay (HOSPITAL_COMMUNITY): Payer: Self-pay | Admitting: Physical Therapy

## 2018-11-12 LAB — CBC
HCT: 26.9 % — ABNORMAL LOW (ref 36.0–46.0)
Hemoglobin: 8.7 g/dL — ABNORMAL LOW (ref 12.0–15.0)
MCH: 29.4 pg (ref 26.0–34.0)
MCHC: 32.3 g/dL (ref 30.0–36.0)
MCV: 90.9 fL (ref 80.0–100.0)
Platelets: 230 10*3/uL (ref 150–400)
RBC: 2.96 MIL/uL — ABNORMAL LOW (ref 3.87–5.11)
RDW: 14.8 % (ref 11.5–15.5)
WBC: 4.1 10*3/uL (ref 4.0–10.5)
nRBC: 0 % (ref 0.0–0.2)

## 2018-11-12 LAB — BASIC METABOLIC PANEL
Anion gap: 8 (ref 5–15)
BUN: 7 mg/dL — ABNORMAL LOW (ref 8–23)
CO2: 25 mmol/L (ref 22–32)
Calcium: 9.4 mg/dL (ref 8.9–10.3)
Chloride: 105 mmol/L (ref 98–111)
Creatinine, Ser: 0.78 mg/dL (ref 0.44–1.00)
GFR calc Af Amer: >60 mL/min (ref >60)
GFR calc non Af Amer: >60 mL/min (ref >60)
Glucose, Bld: 111 mg/dL — ABNORMAL HIGH (ref 70–99)
Potassium: 4.3 mmol/L (ref 3.5–5.1)
Sodium: 138 mmol/L (ref 135–145)

## 2018-11-12 NOTE — Progress Notes (Signed)
Social Work  Social Work Assessment and Plan  Patient Details  Name: Destiny [name] MRN: [id number] Date of Birth: July 28, 1955  Today's Date: 11/12/2018  Problem List:  Patient Active Problem List   Diagnosis Date Noted  . Embolic stroke ([employer]) 93/81/0175  . 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 Thrombocytopenia ([place])   . Cerebrovascular accident (CVA) ([place])   . HLD (hyperlipidemia) 11/02/2018  . GERD (gastroesophageal reflux disease) 11/02/2018  . CAD (coronary artery disease) 11/02/2018  . Sleep apnea 11/02/2018  . Hypotension 11/02/2018  . Somnolence 11/02/2018  . Chronic diastolic heart failure ([place]) 11/15/2017  . HTN (hypertension) 11/15/2017  . Hypokalemia 11/05/2017  . Chest pain 02/14/2016  . Small bowel obstruction ([place]) 06/16/2015   Past Medical History:  Past Medical History:  Diagnosis Date  . CAD (coronary artery disease)   . CHF (congestive heart failure) ([place])   . CVA (cerebral infarction)   . Female bladder prolapse   . GERD (gastroesophageal reflux disease)   . Hyperlipidemia   . Hypertension   . Hypokalemia   . Mitral valve disease   . Ovarian ca ([place])   . Sleep apnea   . Stroke [facility])    x3   Past Surgical History:  Past Surgical History:  Procedure Laterality Date  . CARPAL TUNNEL RELEASE Bilateral   . CATARACT EXTRACTION Right 11/22/2017  . CHOLECYSTECTOMY    . COLON SURGERY    . TENNIS ELBOW RELEASE/NIRSCHEL PROCEDURE Left   . TONSILLECTOMY     Social History:  reports that she has never smoked. She has never used smokeless tobacco. She reports that she does not drink alcohol or use drugs.  Family / Support Systems Marital Status: Married Patient Roles: Spouse, Parent, Other (Comment)(retiree) Spouse/Significant Other: Kasandra Knudsen [phone]  [phone] Children: Russell-son [phone] Other Supports: Daughter in [employer] Anticipated Caregiver: Husband Ability/Limitations of Caregiver: Disabled due to cardiac  issues Caregiver Availability: 24/7 Family Dynamics: Close knit with son who is local and daughter who lives in [place] now for a job. Husband is very supportive and was assisting her when she was re-admitted to the hospital. They have neighbors and friends who supportive.  Social History Preferred language: English Religion: Baptist Cultural Background: No issues Education: CNA classes Read: Yes Write: Yes Employment Status: Retired Date Retired/Disabled/Unemployed: 02/2018 Age Retired: 5 Legal History/Current Legal Issues: No issues Guardian/Conservator: None-according to MD pt is capable of making her own decisions while here, she does want her husband involved in any decisions made while here   Abuse/Neglect Abuse/Neglect Assessment Can Be Completed: Yes Physical Abuse: Denies Verbal Abuse: Denies Sexual Abuse: Denies Exploitation of patient/patient's resources: Denies Self-Neglect: Denies  Emotional Status Pt's affect, behavior and adjustment status: Pt is motivated to get better and wants to be at home. She has been in several facilities and has only been home for one week then right back into the hospital for another health issue. She feels she has made some progress while here already and hopeful it will continue while here. Recent Psychosocial Issues: other health issues-ovarian cancer, wounds, etc in a short period of time Psychiatric History: History of depression takes medications for which she finds helpful but would benefit from seeing neuro-psych while here for coping. Will make referral to be seen. Substance Abuse History: No issues  Patient / Family Perceptions, Expectations & Goals Pt/Family understanding of illness & functional limitations: Pt and husband can explain her health issues and try to stay on top of them. She does  talk with the MD daily and her husband calls her other MD's if questions. Pt feels she needs to recover from this first before chemo can  begin and her labs have to be right. Premorbid pt/family roles/activities: Wife, Mom grandmother, retiree, friends, church members, etc Anticipated changes in roles/activities/participation: resume Pt/family expectations/goals: Pt states: " I want to go home but need to be moving better for my husband."  Husband states: " I will help her and have but want her to be medically stable so she can stay at home once she comes back."  [employer]: Other (Comment)([facility] [place] Care/DME Agencies: Other (Comment)(wc, bsc, rollator, tub seat) Transportation available at discharge: Husband Resource referrals recommended: Neuropsychology, Support group (specify)  Discharge Planning Living Arrangements: Spouse/significant other Support Systems: Spouse/significant other, Children, Friends/neighbors, Church/faith community Type of Residence: Private residence Insurance Resources: Self-pay(Medicaid pending) Museum/gallery curator Resources: Fish farm manager, Family Support Financial Screen Referred: No Living Expenses: Education officer, community Management: Patient, Spouse Does the patient have any problems obtaining your medications?: Yes (Describe)(uninsured ) Home Management: Husband has been doing this since pt has been ill Patient/Family Preliminary Plans: Return home with husband who can physically assist and was prior to coming back into the hospital. She was min assist with her rollator rolling walker prior to being re-admitted for a stroke and sepsis. Will work on discharge needs and make referral for neuro-psych. Sw Barriers to Discharge: Medical stability, Other (comments) Sw Barriers to Discharge Comments: Medically complex and uninsured-MA pending Social Work Anticipated Follow Up Needs: HH/OP, Support Group  Clinical Impression Pleasant female who is wanting to go home she has been in and out of a facility and/or hospital for the past few months with her health  issues. She has been home for one week before having to come back in to the hospital. Her husband can assist but has cardiac issues which he is disabled from. Her son is local and can assist some. Will make neuro-psych referral and work on her discharge needs.  Elease Hashimoto 11/12/2018, 10:55 AM

## 2018-11-12 NOTE — Progress Notes (Signed)
Physical Therapy Session Note  Patient Details  Name: Destiny [name] MRN: [id number] Date of Birth: [birth year]  Today's Date: 11/12/2018 PT Individual Time: 1300-1400 PT Individual Time Calculation (min): 60 min   Short Term Goals: Week 1:  PT Short Term Goal 1 (Week 1): Pt will perform bed mobility with mod assist  PT Short Term Goal 2 (Week 1): Pt will perfomed WC<>bed transfer with mod assist  PT Short Term Goal 3 (Week 1): Pt eill ambulate 32f with mod assist of 1.  PT Short Term Goal 4 (Week 1): Pt will propell WC 1029fwith min assist   Skilled Therapeutic Interventions/Progress Updates:    [facility] and willing to work with therapy this afternoon. [facility] max[facility], from there able to perform 4 sit to stands from steady pads with MinA fading to [place]. Patient easily fatigued and only able to maintain static standing for 15-20 seconds at a time, HR noted to elevate to 142BPM with standing activities and recovered to 113-116BPM at rest in sitting throughout session. Limited standing activities due to elevated HR during this session. Otherwise performed L LE NMR activity including kicking rolling ball back to therapist in sitting, also worked on L UE strength and coordination with yellow clothespin pinches with Mod-MaxA due to general weakness in L UE. Able to self-propel WC approximately 3075fsing R LE/UE with MinA, Mod cues for technique and likely could have gone further but was limited by time constraints of session. She was left up in her WC with seat belt alarm active, all other needs met this afternoon.   Therapy Documentation Precautions:  Precautions Precaution Comments: Lt hemi, abdominal wound + Rt chest port  Restrictions Weight Bearing Restrictions: No Pain: Pain Assessment Pain Scale: 0-10 Pain Score: 0-No pain    Therapy/Group: Individual Therapy  KriDeniece Ree,  DPT, CBIS  Supplemental Physical Therapist [facility] Pager [phone] Rehab Office [phone]/27/2020, 3:20 PM

## 2018-11-12 NOTE — Progress Notes (Signed)
Inpatient Rehabilitation  Patient information reviewed and entered into eRehab system by Naftoli Penny M. Kwali Wrinkle, M.A., CCC/SLP, PPS Coordinator.  Information including medical coding, functional ability and quality indicators will be reviewed and updated through discharge.    

## 2018-11-12 NOTE — Progress Notes (Signed)
Occupational Therapy Session Note  Patient Details  Name: Destiny [name] MRN: [id number] Date of Birth: 1955/09/15  Today's Date: 11/12/2018 OT Individual Time: 5573-2202 OT Individual Time Calculation (min): 71 min   Short Term Goals: Week 1:  OT Short Term Goal 1 (Week 1): Pt will complete self care EOB with Mod A for lose of balance recovery  OT Short Term Goal 2 (Week 1): Pt will don shirt with Mod A using hemi dressing strategies  OT Short Term Goal 3 (Week 1): Pt will use Lt hand as gross stabilizer during grooming tasks with Mod A to maintain functional grasp  Skilled Therapeutic Interventions/Progress Updates:    Pt greeted in bed, just finished breakfast. Declining bathing, but agreeable to attempt toileting. Supine<sit with HOB elevated completed with Mod A. OT facilitated Lt hand grasp onto bedrail so she could activate Lt side to pull. Mod-Max A for reciprocal scooting EOB in prep for [place] transfer, with Max A for sit<stand into device. Assist required for placement of Lt hand on bar. With increased time sitting on toilet, pt with continent bladder void. Tried to give her fresh coffee to promote bowel elimination, however coffee was too hot for her. Total A for hygiene and brief change. Max A to maintain Lt hand grasp on bar. To work on supported standing, pt completed handwashing, oral care, and face washing while perched in [place]. Pt required max multimodal cues to correct hunched posture and Lt lean. Facilitated L UE weightbearing on sink via forearm, and also facilitated use of limb as gross stabilizer as needed. Pt also required vcs for sustained attention, reported feeling ok but seemed spacey/aloof when participating in functional tasks. After transferring to w/c, she donned clean hospital gown with Max A and Teds with Total A. Noted clonus activity R LE when Teds were being donned. Pt reports this is normal PTA. She was left in w/c with safety belt fastened, L UE supported on  pillow, and all needs within reach. Tx focus today placed on functional transfers, Lt NMR, and ADL retraining.    Therapy Documentation Precautions:  Precautions Precaution Comments: Lt hemi, abdominal wound + Rt chest port  Restrictions Weight Bearing Restrictions: No Pain: No c/o pain during tx   ADL: ADL Grooming: Supervision/safety Where Assessed-Grooming: Chair Upper Body Bathing: Minimal assistance Where Assessed-Upper Body Bathing: Edge of bed Lower Body Bathing: Maximal assistance Where Assessed-Lower Body Bathing: Edge of bed Upper Body Dressing: Maximal assistance Where Assessed-Upper Body Dressing: Edge of bed Lower Body Dressing: Maximal assistance Where Assessed-Lower Body Dressing: Edge of bed Toileting: Not assessed Toilet Transfer: Not assessed Social research officer, government: Not assessed    Therapy/Group: Individual Therapy  Dione Mccombie A Jermond Burkemper 11/12/2018, 11:19 AM

## 2018-11-12 NOTE — Progress Notes (Signed)
[facility] PHYSICAL MEDICINE & REHABILITATION PROGRESS NOTE   Subjective/Complaints: No issues overnite , states she is hungry for breakfast Remembers I saw pt yesterday Review of systems denies chest pain shortness of breath nausea vomiting diarrhea   Objective:   No results found. Recent Labs    11/11/18 0700 11/12/18 0609  WBC 4.1 4.1  HGB 8.5* 8.7*  HCT 26.2* 26.9*  PLT 149* 230   Recent Labs    11/10/18 0512 11/12/18 0609  NA 137 138  K 4.4 4.3  CL 103 105  CO2 23 25  GLUCOSE 94 111*  BUN 7* 7*  CREATININE 0.57 0.78  CALCIUM 9.7 9.4    Intake/Output Summary (Last 24 hours) at 11/12/2018 0802 Last data filed at 11/11/2018 1755 Gross per 24 hour  Intake 480 ml  Output -  Net 480 ml     Physical Exam: Vital Signs Blood pressure 112/74, pulse (!) 108, temperature 98.6 F (37 C), temperature source Oral, resp. rate 16, height 5\' 2"  (1.575 m), weight 87.1 kg, SpO2 97 %.   General: No acute distress Mood and affect are appropriate Heart: Regular rate and rhythm no rubs murmurs or extra sounds Lungs: Clear to auscultation, breathing unlabored, no rales or wheezes Abdomen: Positive bowel sounds, soft nontender to palpation, nondistended Extremities: No clubbing, cyanosis, or edema Skin: No evidence of breakdown, no evidence of rash, midline abdominal scar his pink granular tissue mainly epithelialized Neurologic: Cranial nerves II through XII intact, motor strength is 5/5 in right and to minus left deltoid, bicep, tricep, grip, hip flexor, knee extensors, ankle dorsiflexor and plantar flexor Visual fields are intact confrontation testing no evidence of left neglect Cerebellar exam normal right finger to nose to finger as well as heel to shin in  upper and lower extremities, unable to perform left side due to weakness Musculoskeletal: Full range of motion in all 4 extremities. No joint swelling     Assessment/Plan: 1. 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  Physiatrist is providing close team supervision and 24 hour management of active medical problems listed below.  Physiatrist and rehab team continue to assess barriers to discharge/monitor patient progress toward functional and medical goals  Care Tool:  Bathing    Body parts bathed by patient: Chest, Abdomen, Right upper leg, Left upper leg, Face   Body parts bathed by helper: Right arm, Left arm, Front perineal area, Buttocks, Right lower leg, Left lower leg     Bathing assist Assist Level: Maximal Assistance - Patient 24 - 49%     Upper Body Dressing/Undressing Upper body dressing   What is the patient wearing?: Hospital gown only    Upper body assist Assist Level: Moderate Assistance - Patient 50 - 74%    Lower Body Dressing/Undressing Lower body dressing      What is the patient wearing?: Incontinence brief     Lower body assist Assist for lower body dressing: Maximal Assistance - Patient 25 - 49%     Toileting Toileting Toileting Activity did not occur (Clothing management and hygiene only): N/A (no void or bm)  Toileting assist Assist for toileting: Maximal Assistance - Patient 25 - 49%     Transfers Chair/bed transfer  Transfers assist  Chair/bed transfer activity did not occur: Safety/medical concerns  Chair/bed transfer assist level: Maximal Assistance - Patient 25 - 49%     Locomotion  Ambulation   Ambulation assist      Assist level: 2 helpers Assistive device: Walker-rolling Max distance: 13ft   Walk 10 feet activity   Assist     Assist level: 2 helpers Assistive device: Walker-rolling   Walk 50 feet activity   Assist Walk 50 feet with 2 turns activity did not occur: Safety/medical concerns         Walk 150 feet activity   Assist Walk 150 feet activity did not occur:  Safety/medical concerns         Walk 10 feet on uneven surface  activity   Assist Walk 10 feet on uneven surfaces activity did not occur: Safety/medical concerns         Wheelchair     Assist   Type of Wheelchair: Manual    Wheelchair assist level: Moderate Assistance - Patient 50 - 74% Max wheelchair distance: 50    Wheelchair 50 feet with 2 turns activity    Assist        Assist Level: Moderate Assistance - Patient 50 - 74%   Wheelchair 150 feet activity     Assist Wheelchair 150 feet activity did not occur: Safety/medical concerns        Medical Problem List and Plan: 1.Functional deficits and left hemiparesissecondary to embolic cva involving right corona radiata, right subcorital parietal lobe, left cerebellum CIR PT, OT, speech eval's 2. Antithrombotics: -DVT/anticoagulation:Mechanical:Sequential compression devices, below kneeBilateral lower extremities -antiplatelet therapy: Low dose ASA resumed today. Plavix resumed platelets are now 230 K 3. Pain Management:Tylenol prn 4. Mood:LCSW to follow for evaluation and support. -antipsychotic agents: N/A 5. Neuropsych: This patientiscapable of making decisions on herown behalf. 6. Skin/Wound Care:Monitor abdominal wound for recurrant drainage as mobility improves. Closed, granulation tissue is dry continue wet-to-dry dressings until scabs are removed 7. Fluids/Electrolytes/Nutrition:follow I's and O's. Check labs tomorrow 8. H/o Ovarian CA: Chemo on hold. 9. Reactive depression/Anxiety: ON Effexor and Paxil. 10. Hyperactive bladder: Continue ditropan. 11. Thrombocytopenia: Continue to monitor platelets with daily CBC. 12. H/o systolic CHF: Continue daily weights. 1800 cc FR. ON spironolactone and Crestor. Off Demadex and metoprolol at this time.  Filed Weights   11/10/18 0614 11/11/18 0622 11/12/18 0556  Weight: 88.1 kg 87.4 kg 87.1 kg   Trending down, bmet normal 13. Pancytopenia: Improving as above 14. Hypokalemia: Improved with supplementation.  4.2 on 11/09/2018    LOS: 3 days A FACE TO FACE EVALUATION WAS PERFORMED  Charlett Blake 11/12/2018, 8:02 AM

## 2018-11-12 NOTE — Care Management Note (Signed)
[facility] Individual Statement of Services  Patient Name:  Destiny [name]  Date:  11/12/2018  Welcome to the [facility].  Our goal is to provide you with an individualized program based on your diagnosis and situation, designed to meet your specific needs.  With this comprehensive rehabilitation program, you will be expected to participate in at least 3 hours of rehabilitation therapies Monday-Friday, with modified therapy programming on the weekends.  Your rehabilitation program will include the following services:  Physical Therapy (PT), Occupational Therapy (OT), Speech Therapy (ST), 24 hour per day rehabilitation nursing, Therapeutic Recreaction (TR), Neuropsychology, Case Management (Social Worker), Rehabilitation Medicine, Nutrition Services and Pharmacy Services  Weekly team conferences will be held on Wednesday to discuss your progress.  Your Social Worker will talk with you frequently to get your input and to update you on team discussions.  Team conferences with you and your family in attendance may also be held.  Expected length of stay: 18-21 days  Overall anticipated outcome: min assist level  Depending on your progress and recovery, your program may change. Your Social Worker will coordinate services and will keep you informed of any changes. Your Social Worker's name and contact numbers are listed  below.  The following services may also be recommended but are not provided by the [facility]:    [place] will be made to provide these services after discharge if needed.  Arrangements include referral to agencies that provide these services.  Your insurance has been verified to be:  Pending Medicaid Your primary doctor is:  [name]  Pertinent information will be shared with your doctor and your insurance company.  Social Worker:   Ovidio Kin, [place] or [phone]  Information discussed with and copy given to patient by: Elease Hashimoto, 11/12/2018, 10:38 AM

## 2018-11-12 NOTE — Progress Notes (Signed)
Speech Language Pathology Daily Session Note  Patient Details  Name: Destiny [name] MRN: [id number] Date of Birth: 11-15-1955  Today's Date: 11/12/2018 SLP Individual Time: 1130-1200 SLP Individual Time Calculation (min): 30 min  Short Term Goals: Week 1: SLP Short Term Goal 1 (Week 1): Patient will consume trials of regular textures with efficient mastication and complete oral clearance over 2 sessions with supervision verbal cues prior to upgrade.  SLP Short Term Goal 2 (Week 1): Patient will demonstrate functional problem solving for basic and familiar tasks with Min A verbal cues.  SLP Short Term Goal 3 (Week 1): Patient will attend to left field of enviornment/body during functional tasks with supervision verbal cues.  SLP Short Term Goal 4 (Week 1): Patient will recall new, daily information with use of external aids with Min A verbal and visual cues.  SLP Short Term Goal 5 (Week 1): Patient will demonstrate selective attention to functional tasks for ~10 minutes with Min A verbal cues for redirection in a mildly distracting enviornment.  SLP Short Term Goal 6 (Week 1): Patient will identify 2 physical and 2 cognitive deficits with Min A verbal cues.   Skilled Therapeutic Interventions:  Skilled ST services focused on swallow and cognitive skills. Pt missed 30 minutes of skilled ST treatment time due to ST awaiting ordered early tray outside of cafeiteria. SLP facilitated PO consumption of dys 3 and thin lunch tray, pt require set up assist and Mod I ability to clear mild oral residue with liquid wash.Marland Kitchen SLP facilitated PO consumption trials of regular textured snacks, pt demonstrated ability to clear oral cavity and no overt s/s aspiration in limited trials. Pt demonstrated ability to scan left of midline locating items on tray mod I and supervision A verbal cues for basic problem solving during meal. SLP facilitated basic problem solving skills sequencing 4 step cards pt required min A verbal  cues. Pt demonstrated delayed recall of 4 out 4 words with a 10 minute delay. Pt identified physical deficits, left arm, however continues to deny cognitive deficits, stating " I think I can go home," SLP provided education of cognitive deficits and it's impact on safety. Pt was left in room with call bell within reach and bed/chair alarm set. ST recommends to continue skilled ST services.      Pain Pain Assessment Pain Score: 0-No pain  Therapy/Group: Individual Therapy  [name]  [facility] 11/12/2018, 12:39 PM

## 2018-11-12 NOTE — Progress Notes (Signed)
Physical Therapy Session Note  Patient Details  Name: Destiny [name] MRN: [id number] Date of Birth: 1956-07-17  Today's Date: 11/12/2018 PT Individual Time: 1212-1238 PT Individual Time Calculation (min): 26 min   Short Term Goals: Week 1:  PT Short Term Goal 1 (Week 1): Pt will perform bed mobility with mod assist  PT Short Term Goal 2 (Week 1): Pt will perfomed WC<>bed transfer with mod assist  PT Short Term Goal 3 (Week 1): Pt eill ambulate 89ft with mod assist of 1.  PT Short Term Goal 4 (Week 1): Pt will propell WC 152ft with min assist   Skilled Therapeutic Interventions/Progress Updates:    Patient seated in w/c in room.  Agreeable to PT.  Noted L hand in between chair and bed and pt on phone with spouse, drew attention to L hand and pt used to assist to hang up phone.  Reports was able to lift call button with this hand.  Sit to stand to RW with L hand splint mod A.  Ambulated to armchair about 6' forward with RW and mod A cues for head up, tall posture, weight shift.  Sit <>stand x 4 from armchair with mod A and cues for hand placement, controlled lowering, etc.  Patient stood at foot of bed and using footboard for support performed side stepping x 2 mod cues for weight shift, posture, increased stance time on L to move R without sliding.  Patient ambulated back to chair about 5' with RW and mod A.  Left seated in w/c with tray table, seat belt alarm activated and call bell in reach.  Therapy Documentation Precautions:  Precautions Precaution Comments: Lt hemi, abdominal wound + Rt chest port  Restrictions Weight Bearing Restrictions: No Pain: Pain Assessment Pain Score: 0-No pain    Therapy/Group: Individual Therapy  Reginia Naas  Magda Kiel, PT 11/12/2018, 12:42 PM

## 2018-11-13 ENCOUNTER — Inpatient Hospital Stay (HOSPITAL_COMMUNITY): Payer: Self-pay | Admitting: Physical Therapy

## 2018-11-13 ENCOUNTER — Inpatient Hospital Stay (HOSPITAL_COMMUNITY): Payer: Self-pay | Admitting: Occupational Therapy

## 2018-11-13 ENCOUNTER — Inpatient Hospital Stay (HOSPITAL_COMMUNITY): Payer: Self-pay | Admitting: Speech Pathology

## 2018-11-13 LAB — CBC
HCT: 26.4 % — ABNORMAL LOW (ref 36.0–46.0)
Hemoglobin: 8.4 g/dL — ABNORMAL LOW (ref 12.0–15.0)
MCH: 29.3 pg (ref 26.0–34.0)
MCHC: 31.8 g/dL (ref 30.0–36.0)
MCV: 92 fL (ref 80.0–100.0)
Platelets: 269 10*3/uL (ref 150–400)
RBC: 2.87 MIL/uL — ABNORMAL LOW (ref 3.87–5.11)
RDW: 15.2 % (ref 11.5–15.5)
WBC: 4.3 10*3/uL (ref 4.0–10.5)
nRBC: 0 % (ref 0.0–0.2)

## 2018-11-13 NOTE — Progress Notes (Signed)
Occupational Therapy Session Note  Patient Details  Name: Destiny [name] MRN: [id number] Date of Birth: 08-12-1955  Today's Date: 11/13/2018 OT Individual Time: 1318-1400 OT Individual Time Calculation (min): 42 min            Make up session   Short Term Goals: Week 1:  OT Short Term Goal 1 (Week 1): Pt will complete self care EOB with Mod A for lose of balance recovery  OT Short Term Goal 2 (Week 1): Pt will don shirt with Mod A using hemi dressing strategies  OT Short Term Goal 3 (Week 1): Pt will use Lt hand as gross stabilizer during grooming tasks with Mod A to maintain functional grasp  Skilled Therapeutic Interventions/Progress Updates:    Make up session with pt sitting up in the wheelchair to start session.  She began working on changing out gowns as she had spilled some of her lunch on the current gown.  Max assist for removal of outer gown placed on like a robe.  She was then able to remove the second gown with min assist.  Mod assist for threading the LUE through the first gown with max assist for donning the second gown like a robe.  Next pt agreed to complete oral hygiene and combing her hair at the sink.  Mod assist for integration of the LUE for holding the toothbrush while applying toothpaste.  She was able to brush her teeth and comb her hair using the non-involved RUE with setup assist only.  Next, had pt complete short distance functional mobility with the RW with mod assist and max instructional cueing for sequence.  She demonstrates increased trunk and cervical flexion with adduction of the LLE with stepping.  Finished session with AAROM exercises for the LUE.  One set of 10 repetitions were needed for shoulder flexion, elbow flexion, wrist extension and digit extension, with min to mod assist from therapist.  Pt left in wheelchair at end of session in preparation for next session with SLP.  Therapy Documentation Precautions:  Precautions Precautions: Fall, Other  (comment) Precaution Comments: Lt hemi, abdominal wound + Rt chest port  Restrictions Weight Bearing Restrictions: No   Pain: Pain Assessment Pain Scale: 0-10 Pain Score: 0-No pain ADL: See Care Tool for some details of ADL   Therapy/Group: Individual Therapy  Majour Frei OTR/L 11/13/2018, 3:48 PM

## 2018-11-13 NOTE — Progress Notes (Signed)
Physical Therapy Session Note  Patient Details  Name: RAKSHA WOLFGANG MRN: [id number] Date of Birth: 30-Jul-1955  Today's Date: 11/13/2018 PT Individual Time: 0905-1020 PT Individual Time Calculation (min): 75 min   Short Term Goals: Week 1:  PT Short Term Goal 1 (Week 1): Pt will perform bed mobility with mod assist  PT Short Term Goal 2 (Week 1): Pt will perfomed WC<>bed transfer with mod assist  PT Short Term Goal 3 (Week 1): Pt eill ambulate 46f with mod assist of 1.  PT Short Term Goal 4 (Week 1): Pt will propell WC 1038fwith min assist   Skilled Therapeutic Interventions/Progress Updates: Pt presented in bed sleeping but easily aroused. PTA initially requiring to shout with pt still not understanding PTA. Pt then responding "my batteries must be dead". Pt indicated where hearing aid batteries were and pt attempted to replace batteries. PTA providing some assistance of stabilizing L hand so pt can remove battery with R hand. After increased time PTA assisting in changing batteries. Pt then performed supine to sit with use of bed features and mod A. Pt performed stand pivot transfer with modA and mod cues for sequencing and using LLE. Pt then attempting to eat magic cup with PTA providing HOH assist for using LUE to stabilize cup while eating with RUE. Pt at 50% of Magic Cup in this manner. Pt then transported to rehab gym and performed standing pivot transfer to mat with RW. Pt required verbal cues for hand placement and verbal cues for increasing use of LLE to facilitate transfer. After brief time at mat during set up for next activity, pt indicating need for bathroom. Pt performed stand pivot to w/c in same manner as prior with RW and pt transported back to room. Pt performed stand pivot transfer with wall rail to toilet with PTA providing mod verbal cues for safety as pt attempting to manage brief with RUE vs holding wall rail (-void/BM). As pt sitting at toilet pt became more fatigued and  unable to come to full standing with multiple attempts. PTA then obtained Stedy and pt required modA to come to full standing. PTA performed peri-care total A for energy conservation and cues for pt to maintain upright posture as pt noted to have significant forward flexed posture. [place] used mirror for visual feedback for midline orientation which pt was able to attain with minA. Pt then returned to w/c via Stedy. Pt transported to day room and participated in x 3 bouts of 10 cycles on Cybex Kinetron 80cm/sec for glute/HS activation and L NMR. Pt then returned to room and agreeable with encouragement to remain in w/c. Pt left in w/c with belt alarm on, call bell within reach and needs met.      Therapy Documentation Precautions:  Precautions Precautions: Fall, Other (comment) Precaution Comments: Lt hemi, abdominal wound + Rt chest port  Restrictions Weight Bearing Restrictions: No General:   Vital Signs: Therapy Vitals Temp: 98.5 F (36.9 C) Temp Source: Oral Pulse Rate: (!) 110 Resp: 16 BP: (!) 104/91 Patient Position (if appropriate): Sitting Oxygen Therapy SpO2: 100 % O2 Device: Room Air Pain: Pain Assessment Pain Scale: 0-10 Pain Score: 0-No pain    Therapy/Group: Individual Therapy  Tenna Lacko  Jamell Laymon, PTA  11/13/2018, 4:46 PM

## 2018-11-13 NOTE — Progress Notes (Signed)
[place] PHYSICAL MEDICINE & REHABILITATION PROGRESS NOTE   Subjective/Complaints:  Pt sleeping with CPAP but awakens to voice.  No c/os Review of systems denies chest pain shortness of breath nausea vomiting diarrhea   Objective:   No results found. Recent Labs    11/12/18 0609 11/13/18 0600  WBC 4.1 4.3  HGB 8.7* 8.4*  HCT 26.9* 26.4*  PLT 230 269   Recent Labs    11/12/18 0609  NA 138  K 4.3  CL 105  CO2 25  GLUCOSE 111*  BUN 7*  CREATININE 0.78  CALCIUM 9.4    Intake/Output Summary (Last 24 hours) at 11/13/2018 1320 Last data filed at 11/13/2018 0700 Gross per 24 hour  Intake 180 ml  Output -  Net 180 ml     Physical Exam: Vital Signs Blood pressure 118/85, pulse (!) 103, temperature 98 F (36.7 C), resp. rate 17, height 5\' 2"  (1.575 m), weight 89.7 kg, SpO2 99 %.   General: No acute distress Mood and affect are appropriate Heart: Regular rate and rhythm no rubs murmurs or extra sounds Lungs: Clear to auscultation, breathing unlabored, no rales or wheezes Abdomen: Positive bowel sounds, soft nontender to palpation, nondistended Extremities: No clubbing, cyanosis, or edema Skin: No evidence of breakdown, no evidence of rash, midline abdominal scar his pink granular tissue mainly epithelialized Neurologic: Cranial nerves II through XII intact, motor strength is 5/5 in right and to minus left deltoid, bicep, tricep, grip, hip flexor, knee extensors, ankle dorsiflexor and plantar flexor Visual fields are intact confrontation testing no evidence of left neglect Cerebellar exam normal right finger to nose to finger as well as heel to shin in  upper and lower extremities, unable to perform left side due to weakness Musculoskeletal: Full range of motion in all 4 extremities. No joint swelling     Assessment/Plan: 1. 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  Physiatrist is providing close team supervision and 24 hour management of active medical problems listed below.  Physiatrist and rehab team continue to assess barriers to discharge/monitor patient progress toward functional and medical goals  Care Tool:  Bathing  Bathing activity did not occur: Refused Body parts bathed by patient: Chest, Abdomen, Right upper leg, Left upper leg, Face   Body parts bathed by helper: Right arm, Left arm, Front perineal area, Buttocks, Right lower leg, Left lower leg     Bathing assist Assist Level: Maximal Assistance - Patient 24 - 49%     Upper Body Dressing/Undressing Upper body dressing   What is the patient wearing?: Hospital gown only    Upper body assist Assist Level: Moderate Assistance - Patient 50 - 74%    Lower Body Dressing/Undressing Lower body dressing      What is the patient wearing?: Incontinence brief     Lower body assist Assist for lower body dressing: Maximal Assistance - Patient 25 - 49%     Toileting Toileting Toileting Activity did not occur (Clothing management and hygiene only): N/A (no void or bm)  Toileting assist Assist for toileting: Total Assistance - Patient < 25%     Transfers Chair/bed transfer  Transfers assist  Chair/bed transfer activity did not occur: Safety/medical concerns  Chair/bed transfer assist level: Maximal Assistance - Patient 25 - 49%     Locomotion Ambulation   Ambulation assist      Assist level: Moderate  Assistance - Patient 50 - 74% Assistive device: Orthosis(L hand splint) Max distance: 6'   Walk 10 feet activity   Assist     Assist level: 2 helpers Assistive device: Walker-rolling   Walk 50 feet activity   Assist Walk 50 feet with 2 turns activity did not occur: Safety/medical concerns         Walk 150 feet activity   Assist Walk 150 feet activity did not occur: Safety/medical concerns          Walk 10 feet on uneven surface  activity   Assist Walk 10 feet on uneven surfaces activity did not occur: Safety/medical concerns         Wheelchair     Assist   Type of Wheelchair: Manual    Wheelchair assist level: Minimal Assistance - Patient > 75% Max wheelchair distance: 54ft     Wheelchair 50 feet with 2 turns activity    Assist        Assist Level: Minimal Assistance - Patient > 75%   Wheelchair 150 feet activity     Assist Wheelchair 150 feet activity did not occur: Safety/medical concerns        Medical Problem List and Plan: 1.Functional deficits and left hemiparesissecondary to embolic cva involving right corona radiata, right subcorital parietal lobe, left cerebellum CIR PT, OT, speech , team conf in am 2. Antithrombotics: -DVT/anticoagulation:Mechanical:Sequential compression devices, below kneeBilateral lower extremities -antiplatelet therapy: Low dose ASA resumed today. Plavix resumed platelets are now 269 K 3. Pain Management:Tylenol prn 4. Mood:LCSW to follow for evaluation and support. -antipsychotic agents: N/A 5. Neuropsych: This patientiscapable of making decisions on herown behalf. 6. Skin/Wound Care:Monitor abdominal wound for recurrant drainage as mobility improves. Closed, granulation tissue is dry continue wet-to-dry dressings until scabs are removed 7. Fluids/Electrolytes/Nutrition:follow I's and O's. Check labs tomorrow 8. H/o Ovarian CA: Chemo on hold. 9. Reactive depression/Anxiety: ON Effexor and Paxil. 10. Hyperactive bladder: Continue ditropan. 11. Thrombocytopenia: Continue to monitor platelets with daily CBC. 12. H/o systolic CHF: Continue daily weights. 1800 cc FR. ON spironolactone and Crestor. Off Demadex and metoprolol at this time.  Filed Weights   11/11/18 0622 11/12/18 0556 11/13/18 0517  Weight: 87.4 kg 87.1 kg 89.7 kg  weight up today no evidence of  increased edema, will monitor prior to med changes 13. Pancytopenia: Improved, still anemic, may reduce CBC to weekly 14. Hypokalemia: Improved with supplementation.  4.2 on 11/09/2018    LOS: 4 days A FACE TO FACE EVALUATION WAS PERFORMED  Charlett Blake 11/13/2018, 1:20 PM

## 2018-11-13 NOTE — Progress Notes (Signed)
Slept well throughout the night. CPAP in use, no complaints verbalized.

## 2018-11-13 NOTE — Progress Notes (Signed)
Physical Therapy Session Note  Patient Details  Name: Destiny [name] MRN: [id number] Date of Birth: 11-27-1955  Today's Date: 11/13/2018 PT Individual Time: 1505(make up time)-1530 PT Individual Time Calculation (min): 25 min   Short Term Goals: Week 1:  PT Short Term Goal 1 (Week 1): Pt will perform bed mobility with mod assist  PT Short Term Goal 2 (Week 1): Pt will perfomed WC<>bed transfer with mod assist  PT Short Term Goal 3 (Week 1): Pt eill ambulate 66ft with mod assist of 1.  PT Short Term Goal 4 (Week 1): Pt will propell WC 161ft with min assist   Skilled Therapeutic Interventions/Progress Updates:   Pt in w/c and agreeable to make-up therapy session, denies pain. Total assist w/c transport to/from therapy gym for time management. Worked on gait training at rail, sit<>stands w/ min assist and ambulated 30' w/ min assist. Manual assist for lateral weight shifting, otherwise needed no assist for LLE management. L foot w/ poor foot clearance and decreased L hip flexion during swing. Worked on L hip flexor activation w/ 4" step taps x30 reps. Returned to room and ended session, all needs in reach.   Therapy Documentation Precautions:  Precautions Precautions: Fall, Other (comment) Precaution Comments: Lt hemi, abdominal wound + Rt chest port  Restrictions Weight Bearing Restrictions: No Vital Signs: Therapy Vitals Temp: 98.5 F (36.9 C) Temp Source: Oral Pulse Rate: (!) 110 Resp: 16 BP: (!) 104/91 Patient Position (if appropriate): Sitting Oxygen Therapy SpO2: 100 % O2 Device: Room Air Pain: Pain Assessment Pain Scale: 0-10 Pain Score: 0-No pain  Therapy/Group: Individual Therapy  Anjelika Ausburn Clent Demark 11/13/2018, 3:59 PM

## 2018-11-13 NOTE — Progress Notes (Signed)
Speech Language Pathology Daily Session Note  Patient Details  Name: Destiny [name] MRN: [id number] Date of Birth: 1956-03-03  Today's Date: 11/13/2018 SLP Individual Time: 1400-1500 SLP Individual Time Calculation (min): 60 min  Short Term Goals: Week 1: SLP Short Term Goal 1 (Week 1): Patient will consume trials of regular textures with efficient mastication and complete oral clearance over 2 sessions with supervision verbal cues prior to upgrade.  SLP Short Term Goal 2 (Week 1): Patient will demonstrate functional problem solving for basic and familiar tasks with Min A verbal cues.  SLP Short Term Goal 3 (Week 1): Patient will attend to left field of enviornment/body during functional tasks with supervision verbal cues.  SLP Short Term Goal 4 (Week 1): Patient will recall new, daily information with use of external aids with Min A verbal and visual cues.  SLP Short Term Goal 5 (Week 1): Patient will demonstrate selective attention to functional tasks for ~10 minutes with Min A verbal cues for redirection in a mildly distracting enviornment.  SLP Short Term Goal 6 (Week 1): Patient will identify 2 physical and 2 cognitive deficits with Min A verbal cues.   Skilled Therapeutic Interventions:  Skilled treatment session focused on cognition goals. SLP facilitiated session by providing semi-complex grocery list task within specified requirements. Pt required Mod A cues to confirm to requirements of task, to locate items, to indicate how much of an item would be needed and to calculate general cost. Pt stated that she "reckoned" this might be something that she should work on. Pt left upright in wheelchair, lap belt alarm on and all needs within reach. Continue per current plan of care.      Pain Pain Assessment Pain Scale: 0-10 Pain Score: 0-No pain  Therapy/Group: Individual Therapy  Zaide Mcclenahan 11/13/2018, 3:06 PM

## 2018-11-13 NOTE — Progress Notes (Signed)
Occupational Therapy Session Note  Patient Details  Name: Destiny [name] MRN: [id number] Date of Birth: Jan 04, 1956  Today's Date: 11/13/2018 OT Individual Time: 0700-0728 OT Individual Time Calculation (min): 28 min  and Today's Date: 11/13/2018 OT Missed Time: 51 Minutes Missed Time Reason: Patient fatigue   Short Term Goals: Week 1:  OT Short Term Goal 1 (Week 1): Pt will complete self care EOB with Mod A for lose of balance recovery  OT Short Term Goal 2 (Week 1): Pt will don shirt with Mod A using hemi dressing strategies  OT Short Term Goal 3 (Week 1): Pt will use Lt hand as gross stabilizer during grooming tasks with Mod A to maintain functional grasp  Skilled Therapeutic Interventions/Progress Updates:    Upon entering the room, pt sleeping soundly. OT making multiple attempts to arouse pt for participation. Pt mumbling at best and not opening eyes. OT attempting to reposition pt in bed and she was unable to assist therapist secondary to lethargy. Pt remaining in bed with bed alarm activated and call bell within reach.   Therapy Documentation Precautions:  Precautions Precaution Comments: Lt hemi, abdominal wound + Rt chest port  Restrictions Weight Bearing Restrictions: No General: General OT Amount of Missed Time: 45 Minutes Vital Signs: Therapy Vitals Temp: 98 F (36.7 C) Pulse Rate: (!) 103 Resp: 17 BP: 118/85 Patient Position (if appropriate): Lying Oxygen Therapy SpO2: 99 % O2 Device: CPAP Pain:   ADL: ADL Grooming: Supervision/safety Where Assessed-Grooming: Chair Upper Body Bathing: Minimal assistance Where Assessed-Upper Body Bathing: Edge of bed Lower Body Bathing: Maximal assistance Where Assessed-Lower Body Bathing: Edge of bed Upper Body Dressing: Maximal assistance Where Assessed-Upper Body Dressing: Edge of bed Lower Body Dressing: Maximal assistance Where Assessed-Lower Body Dressing: Edge of bed Toileting: Not assessed Toilet Transfer: Not  assessed Social research officer, government: Not assessed   Therapy/Group: Individual Therapy  Gypsy Decant 11/13/2018, 7:39 AM

## 2018-11-14 ENCOUNTER — Inpatient Hospital Stay (HOSPITAL_COMMUNITY): Payer: Self-pay | Admitting: Occupational Therapy

## 2018-11-14 ENCOUNTER — Inpatient Hospital Stay (HOSPITAL_COMMUNITY): Payer: Self-pay

## 2018-11-14 ENCOUNTER — Inpatient Hospital Stay (HOSPITAL_COMMUNITY): Payer: Self-pay | Admitting: Physical Therapy

## 2018-11-14 ENCOUNTER — Inpatient Hospital Stay (HOSPITAL_COMMUNITY): Payer: Self-pay | Admitting: Speech Pathology

## 2018-11-14 ENCOUNTER — Encounter (HOSPITAL_COMMUNITY): Payer: Self-pay | Admitting: Psychology

## 2018-11-14 NOTE — Consult Note (Signed)
Neuropsychological Consultation   Patient:   Destiny [name]   DOB:   September 24, 1955  MR Number:  [id number]  Location:  [facility] [id number] [place] [address] Dept: [place]: [phone]           Date of Service:   11/14/2018  Start Time:   10 AM End Time:   11 AM  Provider/Observer:  Ilean Skill, Psy.D.       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  Patient was admitted to [facility] on 11/01/2018 with reports of somnolence most of the day.  She was found to be hypotensive, dehydrated, significantly hypokalemic with K+2.1 and difficult to arouse.  Symptoms progressed from initial LUE numbness that continued to LUE/LLE weakness with facial droop and dysarthria.  MRI brain done revealing multiple small infarcts in right corona radiata, subcortical right partial lobe and left cerebellum with moderate SVD.  Patient has history of depression and prior strokes.  Patient was aware of her cancer treatment course and doing poorly after first round of chemo.  Patient reports that she has been through a great deal and she is dealing with things as they come.  Admits to being more and more apopathetic about status but continuing to work on improving medically.    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 She was admitted to [facility] 4/16 with reports of somnolence most of the day. She was found to be hypotensive, dehydrated, significantly hypokalemic with K+ 2.1 and was difficult to arouse. She was started on IV Vanc/Cefepime for sepsis as well as fluids. On 4/19, as noted to have LUE numbness that progressed to LUE/LLE weakness with facial droop and dysarthria. CT head was negative for bleed and CTA negative for LVO. CT abdomen showed post op changes and decrease in fluid along surgical wound. CT chest was showed right pleural effusion with atelectasis and mild left atelectasis.   She was not a candidate for tPA and neurology recommended full work up as well as avoidance of hypotension. MRI brain done revealing multiple small infarcts in right corona radiata, subcortical right parietal lobe and left cerebellum with moderate small vessel disease. 2D echo revealed EF 60-65% with negative agitation study and no wall abnormality. Hem/Onc was consulted for input due to ongoing further drop in platelets to 21,000 thrombocytopenia and questioned SE of chemo v/s SQ heparin. HIT panel negative and to monitor for signs of bleeding as well as counts daily for now --to add plavix once plts >50,000. Blood cultures X 2 and UCS negative therefore antibiotics d/Destiny by 4/22. Stroke felt to be embolic question due to hypercoagulable state from sepsis/cancer--DAPT recommended --to addplavix platelets>100,000. ASA started today as platelets up to 60,000.Mentation and mood improving but patient continues to be limited by left sided weakness and difficulty following multi-step commands. CIR recommended  due to functional deficits.  Current Status:  Patient has history of depression and prior strokes.  Patient was aware of her cancer treatment course and doing poorly after first round of chemo.  Patient reports that she has been through a great deal and she is dealing with things  as they come.  Admits to being more and more apopathetic about status but continuing to work on improving medically.     Behavioral Observation: Destiny [name]  presents as a 63 y.o.-year-old Right Caucasian Female who appeared her stated age. her dress was Appropriate and she was Well Groomed and her manners were Appropriate to the situation.  her participation was indicative of Appropriate and Redirectable behaviors.  There were any physical disabilities noted.  she displayed an appropriate level of cooperation and motivation.     Interactions:    Active Appropriate and Redirectable  Attention:   abnormal and attention span appeared shorter than expected for age  Memory:   abnormal; remote memory intact, recent memory impaired  Visuo-spatial:  not examined  Speech (Volume):  low  Speech:   normal; normal  Thought Process:  Coherent and Tangential  Though Content:  WNL; not suicidal and not homicidal  Orientation:   person, place and time/date  Judgment:   Fair  Planning:   Poor  Affect:    Flat and Lethargic  Mood:    Dysphoric  Insight:   Fair  Intelligence:   normal  Medical History:   Past Medical History:  Diagnosis Date  . CAD (coronary artery disease)   . CHF (congestive heart failure) ([place])   . CVA (cerebral infarction)   . Female bladder prolapse   . GERD (gastroesophageal reflux disease)   . Hyperlipidemia   . Hypertension   . Hypokalemia   . Mitral valve disease   . Ovarian ca ([place])   . Sleep apnea   . Stroke [facility])    x3     Psychiatric History:  Patient has past history of depression as well as sleep disturbance and OSA.    Family Med/Psych History:  Family History  Problem Relation Age of Onset  . Heart disease Other   . Hypertension Other   . Rheum arthritis Mother   . CAD Father     Impression/DX:  Destiny [name] is a 63 year old female with history of CAD, MVP, OSA, prior strokes, malnutrition, depression, hight grade serous  adenocarcinoma of fallopian tube with unresectable disease.  Patient was admitted to [facility] on 11/01/2018 with reports of somnolence most of the day.  She was found to be hypotensive, dehydrated, significantly hypokalemic with K+2.1 and difficult to arouse.  Symptoms progressed from initial LUE numbness that continued to LUE/LLE weakness with facial droop and dysarthria.  MRI brain done revealing multiple small infarcts in right corona radiata, subcortical right partial lobe and left cerebellum with moderate SVD.  Patient has history of depression and prior strokes.  Patient was aware of her cancer treatment course and doing poorly after first round of chemo.  Patient reports that she has been through a great deal and she is dealing with things as they come.  Admits to being more and more apopathetic about status but continuing to work on improving medically.    Disposition/Plan:  Worked on coping and adjustment issues and working on how patient is coping with cancer dx.    Diagnosis:           Electronically Signed   _______________________ Ilean Skill, Psy.D.

## 2018-11-14 NOTE — Progress Notes (Signed)
Occupational Therapy Session Note  Patient Details  Name: Destiny [name] MRN: [id number] Date of Birth: January 09, 1956  Today's Date: 11/14/2018 OT Individual Time: 1103-1130 OT Individual Time Calculation (min): 27 min    Short Term Goals: Week 1:  OT Short Term Goal 1 (Week 1): Pt will complete self care EOB with Mod A for lose of balance recovery  OT Short Term Goal 2 (Week 1): Pt will don shirt with Mod A using hemi dressing strategies  OT Short Term Goal 3 (Week 1): Pt will use Lt hand as gross stabilizer during grooming tasks with Mod A to maintain functional grasp  Skilled Therapeutic Interventions/Progress Updates:    Upon entering the room, pt seated in wheelchair with no c/o pain this session and agreeable to OT intervention. Pt requesting to use the bathroom. Pt's brief was wet as well. OT assisted pt via wheelchair into bathroom. Stand pivot transfer with use of grab bar and mod A onto toilet. Pt able to void but unable to have BM. Pt standing with min A for balance with total A for hygiene and clothing management. Pt returning to wheelchair with mod A stand pivot transfer. Pt washing hands at sink with min cuing to incorporate L UE into task. Pt remained seated in wheelchair with chair alarm belt donned for safety and call bell within reach.   Therapy Documentation Precautions:  Precautions Precautions: Fall, Other (comment) Precaution Comments: Lt hemi, abdominal wound + Rt chest port  Restrictions Weight Bearing Restrictions: No ADL: ADL Grooming: Supervision/safety Where Assessed-Grooming: Chair Upper Body Bathing: Minimal assistance Where Assessed-Upper Body Bathing: Edge of bed Lower Body Bathing: Maximal assistance Where Assessed-Lower Body Bathing: Edge of bed Upper Body Dressing: Maximal assistance Where Assessed-Upper Body Dressing: Edge of bed Lower Body Dressing: Maximal assistance Where Assessed-Lower Body Dressing: Edge of bed Toileting: Not assessed Toilet  Transfer: Not assessed Gaffer Transfer: Not assessed   Therapy/Group: Individual Therapy  Gypsy Decant 11/14/2018, 12:07 PM

## 2018-11-14 NOTE — Patient Care Conference (Signed)
Inpatient RehabilitationTeam Conference and Plan of Care Update Date: 11/14/2018   Time: 10:45 AM    Patient Name: Destiny [name]      Medical Record Number: [id number]  Date of Birth: November 01, 1955 Sex: Female         Room/Bed: 4W08C/4W08C-01 Payor Info: Payor: MEDICAID PENDING / Plan: MEDICAID PENDING / Product Type: *No Product type* /    Admitting Diagnosis: cva  Admit Date/Time:  11/09/2018  2:58 PM Admission Comments: No comment available   Primary Diagnosis:  <principal problem not specified> Principal Problem: <principal problem not specified>  Patient Active Problem List   Diagnosis Date Noted  . Embolic stroke ([place]) 09/73/5329  . 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 Thrombocytopenia ([place])   . Cerebrovascular accident (CVA) ([place])   . HLD (hyperlipidemia) 11/02/2018  . GERD (gastroesophageal reflux disease) 11/02/2018  . CAD (coronary artery disease) 11/02/2018  . Sleep apnea 11/02/2018  . Hypotension 11/02/2018  . Somnolence 11/02/2018  . Chronic diastolic heart failure ([place]) 11/15/2017  . HTN (hypertension) 11/15/2017  . Hypokalemia 11/05/2017  . Chest pain 02/14/2016  . Small bowel obstruction ([place]) 06/16/2015    Expected Discharge Date: Expected Discharge Date: 12/01/18  Team Members Present: Physician leading conference: Dr. Alysia Penna Social Worker Present: Ovidio Kin, LCSW Nurse Present: Ellison Carwin, LPN PT Present: Barrie Folk, PT;Rosita Dechalus, PTA OT Present: Darleen Crocker, OT SLP Present: Stormy Fabian, SLP PPS Coordinator present : Gunnar Fusi     Current Status/Progress Goal Weekly Team Focus  Medical   wound healing ok, max A ADLs     WOund healing, monitor PLT and Hgb with restarted ASA, clopidigrel   Bowel/Bladder   Continent of bowel and bladder LBM-4/27  Remain continent of B/B  Assist with toileting needs as needed   Swallow/Nutrition/ Hydration   supervision with dysphagia 3   Mod I with least restrictive diet   use of compensatory swallow strategies and trials of regular   ADL's   Max A for bathing, Max  A UB/LB dressing, Total A toileting + dependent for toilet transfers using Stedy   Supervision-Min A overall   NMR, functional transfers, standing balance, activity tolerance, general strengthening, cognitive remediation     Mobility   modA bed mobility, modA stand pivot with RW, minA gait 40ft at wall rail  minA overall  L NMR, transfers, gait, endurance, d/c planning    Communication             Safety/Cognition/ Behavioral Observations  Mod A   Min A with higher level cognitive function  completion of semi-complex problem solving   Pain   Denies pain   Remain pain free  Assess pain every shift and as needed   Skin   blister noted to medial aspect of buttocks, cracking noted to left breast fold, ecchymosis arms, legs, abdomen, MASD vagina/abdomen  promote healing, no new skin issues, maintain skin integrity  Assess skin every shift and prn, address skin issues every shift and prn      *See Care Plan and progress notes for long and short-term goals.     Barriers to Discharge  Current Status/Progress Possible Resolutions Date Resolved   Physician    Medical stability     Progressing toward goals  Cont PT, OT,  SLP CIR level      Nursing                  PT  Decreased caregiver support;Medical stability;Pending chemo/radiation;Insurance for  SNF coverage;Wound Care                 OT                  SLP                SW Medical stability;Other (comments) Medically complex and uninsured-MA pending            Discharge Planning/Teaching Needs:  Home with husband who can assist and was prior to admission to hospital. Neuro-psych to see pt today for coping.       Team Discussion:  Goals min assist level, currently mod/max level. Making progress daily in therapies. Pain manaaged. Lacks awareness of her deficits. No insight into her deficits. Wound looking good and still requires wet  to dry dressing. Will need family education prior to DC home.  Revisions to Treatment Plan:  DC 5/16    Continued Need for Acute Rehabilitation Level of Care: The patient requires daily medical management by a physician with specialized training in physical medicine and rehabilitation for the following conditions: Daily direction of a multidisciplinary physical rehabilitation program to ensure safe treatment while eliciting the highest outcome that is of practical value to the patient.: Yes Daily medical management of patient stability for increased activity during participation in an intensive rehabilitation regime.: Yes Daily analysis of laboratory values and/or radiology reports with any subsequent need for medication adjustment of medical intervention for : Neurological problems   I attest that I was present, lead the team conference, and concur with the assessment and plan of the team.   Elease Hashimoto 11/14/2018, 2:21 PM

## 2018-11-14 NOTE — Progress Notes (Signed)
Speech Language Pathology Daily Session Note  Patient Details  Name: Destiny [name] MRN: [id number] Date of Birth: [birth year]  Today's Date: 11/14/2018 SLP Individual Time: 3606-7703 SLP Individual Time Calculation (min): 30 min  Short Term Goals: Week 1: SLP Short Term Goal 1 (Week 1): Patient will consume trials of regular textures with efficient mastication and complete oral clearance over 2 sessions with supervision verbal cues prior to upgrade.  SLP Short Term Goal 2 (Week 1): Patient will demonstrate functional problem solving for basic and familiar tasks with Min A verbal cues.  SLP Short Term Goal 3 (Week 1): Patient will attend to left field of enviornment/body during functional tasks with supervision verbal cues.  SLP Short Term Goal 4 (Week 1): Patient will recall new, daily information with use of external aids with Min A verbal and visual cues.  SLP Short Term Goal 5 (Week 1): Patient will demonstrate selective attention to functional tasks for ~10 minutes with Min A verbal cues for redirection in a mildly distracting enviornment.  SLP Short Term Goal 6 (Week 1): Patient will identify 2 physical and 2 cognitive deficits with Min A verbal cues.   Skilled Therapeutic Interventions:  Skilled treatment session focused on cognition goals. SLP received pt upright in wheelchair, awake/alert and ready to participate in therapy session. Pt stated that she was just informed of recommendation for 3 week rehab stay (instead of 2). Pt voices that she understand both pros and cons of staying longer. Pt with improved insight into need for therapy and can state need to get leg and arm stronger. Encouragement provided. Pt states that she enjoys word search puzzles. SLP made copies and facilitated initiation of task. SLP left pencil/pen and word search with pt. Pt left upright in wheelchair, lap belt alarm on and all needs within reach.      Pain    Therapy/Group: Individual Therapy  Seanmichael Salmons 11/14/2018, 3:06 PM

## 2018-11-14 NOTE — Progress Notes (Signed)
Physical Therapy Session Note  Patient Details  Name: Destiny [name] MRN: [id number] Date of Birth: 1955/10/09  Today's Date: 11/14/2018 PT Individual Time: 0905-1000 AND 6122-4497 PT Individual Time Calculation (min): 55 min AND 53 min   Short Term Goals: Week 1:  PT Short Term Goal 1 (Week 1): Pt will perform bed mobility with mod assist  PT Short Term Goal 2 (Week 1): Pt will perfomed WC<>bed transfer with mod assist  PT Short Term Goal 3 (Week 1): Pt eill ambulate 74f with mod assist of 1.  PT Short Term Goal 4 (Week 1): Pt will propell WC 101fwith min assist   Skilled Therapeutic Interventions/Progress Updates:  Session 1  Pt received supine in bed and agreeable to PT. Supine>sit transfer with min assist at trunk and min cues for improved use of bed features to improve roll prior to attempting to sit.  Sit<>stand from EOB x 2 with mod>min assist from PT and RW with hand splint. PT also instructed pt in sit<>stand from EOM in day room x 5 with RW and min assist. Min cues for proper UE placement to push from bed as well as improved erect posture once in standing.   Gait training with RW 2 x 3086fith min-mod assist from PT for safety, AD management and improved weight shift to the R to advance the LLE.   Reciprocal movement training and forced use of the LLE/LUE to perform reciprocal stepping on 6 inch step. 2 x 4 BLE. Min-mod assist and min cues for increased step height, improved BOS, and improved posture. Mild GR with fatigue on the LLE.   Patient returned to room and [facility] reach and all needs met.    Session 2.   Pt received sitting in WC and agreeable to PT. Min assist from PT for reciprocal scooting in [facility] and posture. Pt [facility] to day [facility] to the R with min assist from PT to nustep with RW. Reciprocal movement training 2x 3.5 min with BLE only.   Pt noted to have clonus in the RLE with  full ROM hip and knee flexion. Level 3>5 with min cues for symmetry of movement and full ROM on the R. Ambulatory transfer to WC [facility] the L with min assist overall and moderate cues for step length on the L and for improved AD management.   PT instructed pt in WC mobility x 100f12fth mod assist to propel WC with BUE. PT required to facilitate grasp on the L to maintain straight path and prevent hitting obstacles on the L. PT then applied tband to L wheel rim to improve grasp with WC propulsion and improve forced use of the LUE.  Patient returned to room and [facility] in reach and all needs met.         Therapy Documentation Precautions:  Precautions Precautions: Fall, Other (comment) Precaution Comments: Lt hemi, abdominal wound + Rt chest port  Restrictions Weight Bearing Restrictions: No Vital Signs: Therapy Vitals Temp: 97.8 F (36.6 C) Pulse Rate: (!) 106 Resp: 18 BP: 109/80 Patient Position (if appropriate): Lying Oxygen Therapy SpO2: 97 % O2 Device: Room Air Pain:   denies  Therapy/Group: Individual Therapy  AustLorie Phenix9/2020, 10:02 AM

## 2018-11-14 NOTE — Progress Notes (Signed)
[place] PHYSICAL MEDICINE & REHABILITATION PROGRESS NOTE   Subjective/Complaints:  Pt denies abd pain, asks what her PLT count is, pt asking about going home  Review of systems denies chest pain shortness of breath nausea vomiting diarrhea   Objective:   No results found. Recent Labs    11/12/18 0609 11/13/18 0600  WBC 4.1 4.3  HGB 8.7* 8.4*  HCT 26.9* 26.4*  PLT 230 269   Recent Labs    11/12/18 0609  NA 138  K 4.3  CL 105  CO2 25  GLUCOSE 111*  BUN 7*  CREATININE 0.78  CALCIUM 9.4    Intake/Output Summary (Last 24 hours) at 11/14/2018 1751 Last data filed at 11/13/2018 2103 Gross per 24 hour  Intake 240 ml  Output -  Net 240 ml     Physical Exam: Vital Signs Blood pressure 109/80, pulse (!) 106, temperature 97.8 F (36.6 C), resp. rate 18, height 5' 2"  (1.575 m), weight 88.9 kg, SpO2 97 %.   General: No acute distress Mood and affect are appropriate Heart: Regular rate and rhythm no rubs murmurs or extra sounds Lungs: Clear to auscultation, breathing unlabored, no rales or wheezes Abdomen: Positive bowel sounds, soft nontender to palpation, nondistended Extremities: No clubbing, cyanosis, or edema Skin: No evidence of breakdown, no evidence of rash, midline abdominal scar his pink granular tissue mainly epithelialized Neurologic: Cranial nerves II through XII intact, motor strength is 5/5 in right and to minus left deltoid, bicep, tricep, grip, hip flexor, knee extensors, ankle dorsiflexor and plantar flexor Visual fields are intact confrontation testing no evidence of left neglect Cerebellar exam normal right finger to nose to finger as well as heel to shin in  upper and lower extremities, unable to perform left side due to weakness Musculoskeletal: Full range of motion in all 4 extremities. No joint swelling       Assessment/Plan: 1. 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  Physiatrist is providing close team supervision and 24 hour management of active medical problems listed below.  Physiatrist and rehab team continue to assess barriers to discharge/monitor patient progress toward functional and medical goals  Care Tool:  Bathing  Bathing activity did not occur: Refused Body parts bathed by patient: Chest, Abdomen, Right upper leg, Left upper leg, Face   Body parts bathed by helper: Right arm, Left arm, Front perineal area, Buttocks, Right lower leg, Left lower leg     Bathing assist Assist Level: Maximal Assistance - Patient 24 - 49%     Upper Body Dressing/Undressing Upper body dressing   What is the patient wearing?: Hospital gown only    Upper body assist Assist Level: Moderate Assistance - Patient 50 - 74%    Lower Body Dressing/Undressing Lower body dressing      What is the patient wearing?: Incontinence brief     Lower body assist Assist for lower body dressing: Maximal Assistance - Patient 25 - 49%     Toileting Toileting Toileting Activity did not occur (Clothing management and hygiene only): N/A (no void or bm)  Toileting assist Assist for toileting: Total Assistance - Patient < 25%     Transfers Chair/bed transfer  Transfers assist  Chair/bed transfer activity did not occur: Safety/medical concerns  Chair/bed transfer assist level: Maximal Assistance - Patient 25 - 49%     Locomotion Ambulation   Ambulation assist  Assist level: Minimal Assistance - Patient > 75% Assistive device: (rail in hallway) Max distance: 30'   Walk 10 feet activity   Assist     Assist level: Minimal Assistance - Patient > 75% Assistive device: (rail in hallway)   Walk 50 feet activity   Assist Walk 50 feet with 2 turns activity did not occur: Safety/medical concerns         Walk 150 feet activity   Assist Walk 150 feet activity did not  occur: Safety/medical concerns         Walk 10 feet on uneven surface  activity   Assist Walk 10 feet on uneven surfaces activity did not occur: Safety/medical concerns         Wheelchair     Assist   Type of Wheelchair: Manual    Wheelchair assist level: Minimal Assistance - Patient > 75% Max wheelchair distance: 58f     Wheelchair 50 feet with 2 turns activity    Assist        Assist Level: Minimal Assistance - Patient > 75%   Wheelchair 150 feet activity     Assist Wheelchair 150 feet activity did not occur: Safety/medical concerns        Medical Problem List and Plan: 1.Functional deficits and left hemiparesissecondary to embolic cva involving right corona radiata, right subcorital parietal lobe, left cerebellum CIR PT, OT, speech , Team conference today please see physician documentation under team conference tab, met with team face-to-face to discuss problems,progress, and goals. Formulized individual treatment plan based on medical history, underlying problem and comorbidities. 2. Antithrombotics: -DVT/anticoagulation:Mechanical:Sequential compression devices, below kneeBilateral lower extremities -antiplatelet therapy: Low dose ASA and . Plavix resumed platelets are now 269 K 3. Pain Management:Tylenol prn 4. Mood:LCSW to follow for evaluation and support. -antipsychotic agents: N/A 5. Neuropsych: This patientiscapable of making decisions on herown behalf. 6. Skin/Wound Care:Monitor abdominal wound for recurrant drainage as mobility improves. Closed, granulation tissue is dry continue wet-to-dry dressings until scabs are removed-improving as above 7. Fluids/Electrolytes/Nutrition:follow I's and O's. Check labs tomorrow 8. H/o Ovarian CA: Chemo on hold. 9. Reactive depression/Anxiety: ON Effexor and Paxil. 10. Hyperactive bladder: Continue ditropan. 11. Thrombocytopenia: Continue to monitor  platelets with daily CBC. 12. H/o systolic CHF: Continue daily weights. 1800 cc FR. ON spironolactone and Crestor. Off Demadex and metoprolol at this time.  Filed Weights   11/12/18 0556 11/13/18 0517 11/14/18 0618  Weight: 87.1 kg 89.7 kg 88.9 kg  weight up today no evidence of increased edema, will monitor prior to med changes 13. Pancytopenia: Improved, still anemic, may reduce CBC to weekly 14. Hypokalemia: Improved with supplementation.  4.2 on 11/09/2018    LOS: 5 days A FACE TO FACE EVALUATION WAS PERFORMED  ACharlett Blake4/29/2020, 8:32 AM

## 2018-11-14 NOTE — Progress Notes (Signed)
Slept well throughout the night. No complaints noted. CPAP in use throughout the night.

## 2018-11-14 NOTE — Progress Notes (Signed)
Physical Therapy Session Note  Patient Details  Name: Destiny [name] MRN: [id number] Date of Birth: [birth year]  Today's Date: 11/14/2018 PT Individual Time: 1445-1545 PT Individual Time Calculation (min): 60 min   Short Term Goals: Week 1:  PT Short Term Goal 1 (Week 1): Pt will perform bed mobility with mod assist  PT Short Term Goal 2 (Week 1): Pt will perfomed WC<>bed transfer with mod assist  PT Short Term Goal 3 (Week 1): Pt eill ambulate 68ft with mod assist of 1.  PT Short Term Goal 4 (Week 1): Pt will propell WC 147ft with min assist   Skilled Therapeutic Interventions/Progress Updates:     Patient in w/c upon PT arrival. Patient alert and agreeable to PT session.  Therapeutic Activity: Transfers: Patient performed sit to/from stand x2 with min A using RW. Provided verbal cues for leaning forward and pushing up from the w/c with her R UE and reaching back for the w/c before sitting. She performed 4 sit to/from stand with emphasis on requiring decreased assistance from the w/c with Min A-CGA with cues for leaning far forward before standing and pushing through B LEs.    Gait Training:  Patient ambulated 30 feet x1 and 25 feet x1 using RW with min A and w/c close behind. Ambulated with decreased gait speed, decreased step length, increased hip and knee flexion, forward trunk lean, and downward head gaze. Provided verbal cues for erect posture, looking ahead, ambulating within the RW for safety, and increased step length.  Wheelchair Mobility:  Patient propelled wheelchair 30 feet with min A using R UE and LE for hemi-technique. Provided verbal cues for hemi technique. Patient had difficulty reaching the floor and could use a hemi height w/c for propulsion.   Neuromuscular Re-ed: Patient performed sitting balance sitting EOB with B UEs grasping a towel roll and holding it out in front of her while PT provided perturbations in all directions. Patient performed well with this  activity, limited only by L UE fatiguing after 3 minutes. Performed B lateral and contralateral reaching in sitting without LOB and patient able to correct sliding to the EOB. Provided cues for scooting back on the bed moving 1 hip at a time.    Patient in w/c at end of session with breaks locked, seat belt alarm set,Pillow under L UE, and all needs within reach.    Therapy Documentation Precautions:  Precautions Precautions: Fall, Other (comment) Precaution Comments: Lt hemi, abdominal wound + Rt chest port  Restrictions Weight Bearing Restrictions: No Pain: Patient denied pain throughout session.   Therapy/Group: Individual Therapy  Destiny [name] L Caius Silbernagel PT, DPT  11/14/2018, 4:19 PM

## 2018-11-14 NOTE — Progress Notes (Signed)
Social Work Patient ID: Destiny [name], female   DOB: 1955/12/13, 63 y.o.   MRN: [id number] Met with pt and spoke with husband via telephone to discuss team conference goals of min assist level and target discharge date 5/16. He wants to make sure she is medically stable before going home since when he did task her home form the facility she stayed home three days before returning to the hospital. He can provide assist and will be glad to come in and learn her care prior to her discharge. Made both aware if she progresses quicker team can move the date up. Will work on discharge needs and await her progress.

## 2018-11-15 ENCOUNTER — Inpatient Hospital Stay (HOSPITAL_COMMUNITY): Payer: Self-pay | Admitting: Speech Pathology

## 2018-11-15 ENCOUNTER — Inpatient Hospital Stay (HOSPITAL_COMMUNITY): Payer: Self-pay | Admitting: Physical Therapy

## 2018-11-15 ENCOUNTER — Inpatient Hospital Stay (HOSPITAL_COMMUNITY): Payer: Self-pay | Admitting: Occupational Therapy

## 2018-11-15 NOTE — Progress Notes (Signed)
[place] PHYSICAL MEDICINE & REHABILITATION PROGRESS NOTE   Subjective/Complaints:  Pt seen in [facility] outside of room with therapist No pain c/os, had a good night   Review of systems denies chest pain shortness of breath nausea vomiting diarrhea   Objective:   No results found. Recent Labs    11/13/18 0600  WBC 4.3  HGB 8.4*  HCT 26.4*  PLT 269   No results for input(s): NA, K, CL, CO2, GLUCOSE, BUN, CREATININE, CALCIUM in the last 72 hours.  Intake/Output Summary (Last 24 hours) at 11/15/2018 1110 Last data filed at 11/15/2018 0825 Gross per 24 hour  Intake 360 ml  Output -  Net 360 ml     Physical Exam: Vital Signs Blood pressure 101/78, pulse (!) 107, temperature 98.6 F (37 C), temperature source Oral, resp. rate 19, height 5\' 2"  (1.575 m), weight 88.9 kg, SpO2 99 %.   General: No acute distress Mood and affect are appropriate Heart: Regular rate and rhythm no rubs murmurs or extra sounds Lungs: Clear to auscultation, breathing unlabored, no rales or wheezes Abdomen: Positive bowel sounds, soft nontender to palpation, nondistended Extremities: No clubbing, cyanosis, or edema Skin: No evidence of breakdown, no evidence of rash, midline abdominal scar his pink granular tissue mainly epithelialized Neurologic: Cranial nerves II through XII intact, motor strength is 5/5 in right and 2 minus left deltoid, bicep, tricep, grip, hip flexor, knee extensors, ankle dorsiflexor and plantar flexor Visual fields are intact confrontation testing no evidence of left neglect Cerebellar exam normal right finger to nose to finger as well as heel to shin in  upper and lower extremities, unable to perform left side due to weakness Musculoskeletal: Full range of motion in all 4 extremities. No joint swelling       Assessment/Plan: 1. 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  Physiatrist is providing close team supervision and 24 hour management of active medical problems listed below.  Physiatrist and rehab team continue to assess barriers to discharge/monitor patient progress toward functional and medical goals  Care Tool:  Bathing  Bathing activity did not occur: Refused Body parts bathed by patient: Chest, Abdomen, Right upper leg, Left upper leg, Face   Body parts bathed by helper: Right arm, Left arm, Front perineal area, Buttocks, Right lower leg, Left lower leg     Bathing assist Assist Level: Maximal Assistance - Patient 24 - 49%     Upper Body Dressing/Undressing Upper body dressing   What is the patient wearing?: Hospital gown only    Upper body assist Assist Level: Moderate Assistance - Patient 50 - 74%    Lower Body Dressing/Undressing Lower body dressing      What is the patient wearing?: Incontinence brief     Lower body assist Assist for lower body dressing: Maximal Assistance - Patient 25 - 49%     Toileting Toileting Toileting Activity did not occur (Clothing management and hygiene only): N/A (no void or bm)  Toileting assist Assist for toileting: Maximal Assistance - Patient 25 - 49%     Transfers Chair/bed transfer  Transfers assist  Chair/bed transfer activity did not occur: Safety/medical concerns  Chair/bed transfer assist level: Minimal Assistance - Patient > 75%     Locomotion Ambulation   Ambulation assist      Assist level: Minimal Assistance - Patient > 75% Assistive device: Walker-rolling Max distance:  30   Walk 10 feet activity   Assist     Assist level: Minimal Assistance - Patient > 75% Assistive device: Walker-rolling   Walk 50 feet activity   Assist Walk 50 feet with 2 turns activity did not occur: Safety/medical concerns         Walk 150 feet activity   Assist Walk 150 feet activity did not occur: Safety/medical concerns          Walk 10 feet on uneven surface  activity   Assist Walk 10 feet on uneven surfaces activity did not occur: Safety/medical concerns         Wheelchair     Assist Will patient use wheelchair at discharge?: Yes Type of Wheelchair: Manual    Wheelchair assist level: Minimal Assistance - Patient > 75% Max wheelchair distance: 69ft     Wheelchair 50 feet with 2 turns activity    Assist        Assist Level: Minimal Assistance - Patient > 75%   Wheelchair 150 feet activity     Assist Wheelchair 150 feet activity did not occur: Safety/medical concerns        Medical Problem List and Plan: 1.Functional deficits and left hemiparesissecondary to embolic cva involving right corona radiata, right subcorital parietal lobe, left cerebellum CIR PT, OT, speech , Tent d/c 5/16   2. Antithrombotics: -DVT/anticoagulation:Mechanical:Sequential compression devices, below kneeBilateral lower extremities -antiplatelet therapy: Low dose ASA and . Plavix resumed platelets are now 269 K- no sign of bleeding 3. Pain Management:Tylenol prn 4. Mood:LCSW to follow for evaluation and support. -antipsychotic agents: N/A 5. Neuropsych: This patientiscapable of making decisions on herown behalf. 6. Skin/Wound Care:Monitor abdominal wound for recurrant drainage as mobility improves. Closed, granulation tissue is dry continue wet-to-dry dressings until scabs are removed-improving as above 7. Fluids/Electrolytes/Nutrition:follow I's and O's. Check labs tomorrow 8. H/o Ovarian CA: Chemo on hold. 9. Reactive depression/Anxiety: ON Effexor and Paxil. 10. Hyperactive bladder: Continue ditropan. 11. Thrombocytopenia: Continue to monitor platelets with daily CBC. 12. H/o systolic CHF: Continue daily weights. 1800 cc FR. ON spironolactone and Crestor. Off Demadex and metoprolol at this time.  Filed Weights   11/12/18 0556 11/13/18 0517 11/14/18  0618  Weight: 87.1 kg 89.7 kg 88.9 kg  weight up today no evidence of increased edema, will monitor prior to med changes 13. Pancytopenia: Improved, still anemic, may reduce CBC to weekly Hgb stable at 8.4 14. Hypokalemia: Improved with supplementation.  4.2 on 11/09/2018    LOS: 6 days A FACE TO FACE EVALUATION WAS PERFORMED  Charlett Blake 11/15/2018, 11:10 AM

## 2018-11-15 NOTE — Progress Notes (Signed)
Occupational Therapy Session Note  Patient Details  Name: Destiny [name] MRN: [id number] Date of Birth: 07/08/1956  Today's Date: 11/15/2018 OT Individual Time: 1250-1405 OT Individual Time Calculation (min): 75 min    Short Term Goals: Week 1:  OT Short Term Goal 1 (Week 1): Pt will complete self care EOB with Mod A for lose of balance recovery  OT Short Term Goal 2 (Week 1): Pt will don shirt with Mod A using hemi dressing strategies  OT Short Term Goal 3 (Week 1): Pt will use Lt hand as gross stabilizer during grooming tasks with Mod A to maintain functional grasp  Skilled Therapeutic Interventions/Progress Updates: patient sitting in chair upon approach for OT.    She required extra time to process commands and to follow through with tasks.  She had good sit to stand and standing balance.   Othewise, her focus for the ADL was to use her left hand as gross assist and she did require moderate assistance to maintain functional grasp.    He did not have person clothing but donned hospital gown with moderate assistance using hemi dressing strategies. She required moderate assistance for maintaining right leg crossed over left to wash foot and don/doff sock. She sat in w/c at sink for bathing and dressing sit to stand.  She was left sitting with safety alarm in her wc and talking on the phone to her husband at the end of the session.  Continue OT Plan of Care.     Therapy Documentation Precautions:  Precautions Precautions: Fall, Other (comment) Precaution Comments: Lt hemi, abdominal wound + Rt chest port  Restrictions Weight Bearing Restrictions: Yes  Pain: Pain Assessment Pain Scale: 0-10 Pain Score: 0-No pain  Therapy/Group: Individual Therapy  Herschell Dimes 11/15/2018, 5:57 PM

## 2018-11-15 NOTE — Progress Notes (Signed)
Patient slept well throughout the night. CPAP in use throughout the night. No complaints verbalized.

## 2018-11-15 NOTE — Progress Notes (Signed)
Physical Therapy Session Note  Patient Details  Name: Destiny [name] MRN: [id number] Date of Birth: Apr 28, 1956  Today's Date: 11/15/2018 PT Individual Time: 0905-1000 PT Individual Time Calculation (min): 55 min   Short Term Goals: Week 1:  PT Short Term Goal 1 (Week 1): Pt will perform bed mobility with mod assist  PT Short Term Goal 2 (Week 1): Pt will perfomed WC<>bed transfer with mod assist  PT Short Term Goal 3 (Week 1): Pt eill ambulate 87f with mod assist of 1.  PT Short Term Goal 4 (Week 1): Pt will propell WC 1014fwith min assist   Skilled Therapeutic Interventions/Progress Updates:  Pt received supine in bed and agreeable to PT. Supine>sit transfer with min assist at trunk with heavy use of bed rails and moderate cues for proper UE placement   Sit<>stand from bed with min assist from PT. [facility]. Pt noted to have incontinent bladder. Sit<>[facility] and pericare.  Moderate cues for safety awareness and attention to proper placement of the LUE on hand orthotic to improve safety while Pt performed pericare. PT required to manage clothing for safety.   WC mobility x 10041fith BUE and tband on the L wheel rim. Min-mod assist to steer WC and moderate cues for improved attention to the L UE to improve ROM at shoulder.   Stand pivot transfer to mat table with RW and min assist on the R. Pt performed sit<>stand from EOB x 4 with RW and min cues for improved safety. Dynamic standing balance and forced use of the LUE to preform R and L lateral reaches min assist from PT and moderate cues for improved posture for normalized trunk activation. Pt then performed horseshoe toss 2x 4 following each lateral reach.   Gait training with RW x 26f23fth min assist and moderate cues for step length on the L and improved posture with set up for transfer to sitting in WC. [place] to room and left sitting in WC w[facility]Ch call bell in  reach and all needs met.         Therapy Documentation Precautions:  Precautions Precautions: Fall, Other (comment) Precaution Comments: Lt hemi, abdominal wound + Rt chest port  Restrictions Weight Bearing Restrictions: No    Vital Signs: Therapy Vitals Pulse Rate: (!) 107 Resp: 19 BP: 101/78 Patient Position (if appropriate): Lying Oxygen Therapy SpO2: 99 % O2 Device: CPAP Pain:    denies   Therapy/Group: Individual Therapy  AustLorie Phenix0/2020, 10:07 AM

## 2018-11-15 NOTE — Progress Notes (Signed)
Speech Language Pathology Daily Session Note  Patient Details  Name: Destiny [name] MRN: [id number] Date of Birth: 1955/07/20  Today's Date: 11/15/2018 SLP Individual Time: 1100-1200 SLP Individual Time Calculation (min): 60 min  Short Term Goals: Week 1: SLP Short Term Goal 1 (Week 1): Patient will consume trials of regular textures with efficient mastication and complete oral clearance over 2 sessions with supervision verbal cues prior to upgrade.  SLP Short Term Goal 2 (Week 1): Patient will demonstrate functional problem solving for basic and familiar tasks with Min A verbal cues.  SLP Short Term Goal 3 (Week 1): Patient will attend to left field of enviornment/body during functional tasks with supervision verbal cues.  SLP Short Term Goal 4 (Week 1): Patient will recall new, daily information with use of external aids with Min A verbal and visual cues.  SLP Short Term Goal 5 (Week 1): Patient will demonstrate selective attention to functional tasks for ~10 minutes with Min A verbal cues for redirection in a mildly distracting enviornment.  SLP Short Term Goal 6 (Week 1): Patient will identify 2 physical and 2 cognitive deficits with Min A verbal cues.   Skilled Therapeutic Interventions:  Skilled treatment session focused on cognition goals. SLP facilitated the session with semi-complex problem solving tasks as pt was demonstrated good ability to complete basic problem solving tasks. Pt required Mod A cues to complete with questionable baseline ability level. SLP conducted more thorough PTA interview. At baseline, pt has never learned how to drive, doesn't have any accounts at a bank, cashes her paycheck then pays bills via money order and prior to lengthy hospitalizations she didn't take medicine. Pt with intermittent word finding deficits during session but was stimulable when questioned. Question possible hospital induced encephalopathy as well as moderate chronic small vessel disease with  chronic infarcts as main neurological reasons for cognitive presentation. Will continue to assess.      Pain Pain Assessment Pain Scale: 0-10 Pain Score: 0-No pain  Therapy/Group: Individual Therapy  Destiny [name] 11/15/2018, 4:52 PM

## 2018-11-16 ENCOUNTER — Inpatient Hospital Stay (HOSPITAL_COMMUNITY): Payer: Self-pay | Admitting: Occupational Therapy

## 2018-11-16 ENCOUNTER — Inpatient Hospital Stay (HOSPITAL_COMMUNITY): Payer: Self-pay | Admitting: Physical Therapy

## 2018-11-16 ENCOUNTER — Inpatient Hospital Stay (HOSPITAL_COMMUNITY): Payer: Self-pay

## 2018-11-16 ENCOUNTER — Inpatient Hospital Stay (HOSPITAL_COMMUNITY): Payer: Self-pay | Admitting: Speech Pathology

## 2018-11-16 NOTE — Progress Notes (Signed)
Nutrition Follow-up  RD working remotely.  DOCUMENTATION CODES:   Obesity unspecified, Severe malnutrition in context of acute illness/injury  INTERVENTION:   - Continue Boost Breeze po TID, each supplement provides 250 kcal and 9 grams of protein  - Continue Magic cup TID with meals, each supplement provides 290 kcal and 9 grams of protein  - Continue MVI daily  - Encourage adequate PO intake  NUTRITION DIAGNOSIS:   Severe Malnutrition related to lethargy/confusion, acute illness (complications stemming from surgery done for her cancer) as evidenced by percent weight loss, energy intake < 75% for > or equal to 1 month.  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 Was admitted to [facility] (4/16-4/24) w/ somnolence and eventual MRI showed several small infarcts. Transferred to CIR for intensive rehab.   Weight down a total of 4 lbs since admission. Reviewed RN edema assessment. Pt with mild pitting generalized edema and non-pitting edema to LUE. Edema may be falsely elevating pt's weight. Will continue to monitor weight trends.  Unable to reach pt via phone call to room on multiple attempts.  Pt's PO intake remains suboptimal at this time but does appear to be slightly improved since initial RD assessment. Will continue to monitor PO intake.  Will continue with current supplement regimen that includes Boost Breeze and [employer]. Will also continue with daily MVI.  Meal Completion: 0-60% x last 8 recorded meals  (extremely varied, averaging 36%)  Medications reviewed and include: Colace, Boost Breeze TID, MVI, K-dur 40 mEq BID  Labs reviewed: hemoglobin 8.4 (L)  Diet Order:   Diet Order            DIET DYS 3 Room service appropriate? Yes; Fluid consistency: Thin; Fluid restriction: 1200 mL Fluid  Diet effective now              EDUCATION NEEDS:   No education needs have been identified at this time  Skin:  Skin Assessment: Skin Integrity Issues: Incisions: Abdomen Other: abrasions to BLE,  MASD to buttocks/groin  Last BM:  11/12/18  Height:   Ht Readings from Last 1 Encounters:  11/09/18 5\' 2"  (1.575 m)    Weight:   Wt Readings from Last 1 Encounters:  11/16/18 89.4 kg    Ideal Body Weight:  50 kg  BMI:  Body mass index is 36.05 kg/m.  Estimated Nutritional Needs:   Kcal:  1600-1850 (19-21 kcal/kg bw)  Protein:  80-90g Pro (20% energy)  Fluid:  1.6-1.8L    Gaynell Face, MS, RD, LDN Inpatient Clinical Dietitian Pager: [phone] Weekend/After Hours: [phone]

## 2018-11-16 NOTE — Plan of Care (Signed)
  Problem: RH SKIN INTEGRITY Goal: RH STG SKIN FREE OF INFECTION/BREAKDOWN Description Patients skin will remain free from further infection or breakdown with mod assist.  Outcome: Progressing  Continue to assess skin and provide care

## 2018-11-16 NOTE — Progress Notes (Signed)
Speech Language Pathology Weekly Progress and Session Note  Patient Details  Name: Destiny [name] MRN: [id number] Date of Birth: 1955-09-17  Beginning of progress report period: November 10, 2018 End of progress report period: Nov 16, 2018  Today's Date: 11/16/2018 SLP Individual Time: 8867-7373 SLP Individual Time Calculation (min): 45 min  Short Term Goals: Week 1: SLP Short Term Goal 1 (Week 1): Patient will consume trials of regular textures with efficient mastication and complete oral clearance over 2 sessions with supervision verbal cues prior to upgrade.  SLP Short Term Goal 1 - Progress (Week 1): Discontinued (comment)(per pt request to remain on dysphagia 3) SLP Short Term Goal 2 (Week 1): Patient will demonstrate functional problem solving for basic and familiar tasks with Min A verbal cues.  SLP Short Term Goal 2 - Progress (Week 1): Met SLP Short Term Goal 3 (Week 1): Patient will attend to left field of enviornment/body during functional tasks with supervision verbal cues.  SLP Short Term Goal 3 - Progress (Week 1): Progressing toward goal SLP Short Term Goal 4 (Week 1): Patient will recall new, daily information with use of external aids with Min A verbal and visual cues.  SLP Short Term Goal 4 - Progress (Week 1): Progressing toward goal SLP Short Term Goal 5 (Week 1): Patient will demonstrate selective attention to functional tasks for ~10 minutes with Min A verbal cues for redirection in a mildly distracting enviornment.  SLP Short Term Goal 5 - Progress (Week 1): Met SLP Short Term Goal 6 (Week 1): Patient will identify 2 physical and 2 cognitive deficits with Min A verbal cues.  SLP Short Term Goal 6 - Progress (Week 1): Met    New Short Term Goals: Week 2: SLP Short Term Goal 1 (Week 2): Patient will demonstrate functional problem solving for basic and familiar tasks with supervision cues.  SLP Short Term Goal 2 (Week 2): Patient will recall new, daily information with use  of external aids with Min A verbal and visual cues.  SLP Short Term Goal 3 (Week 2): Patient will demonstrate selective attention to functional tasks for ~30 minutes with Min A verbal cues for redirection in a mildly distracting enviornment.  SLP Short Term Goal 4 (Week 2): Patient will identify 2 physical and 2 cognitive deficits with supervision verbal cues.   Weekly Progress Updates: Pt has made progress during this reporting period and as a result she has met 3 of 6 LTGs with progress make towards the remaining 3 goals. Several sessions have focused on transitioning to more semi-complex cognitive functions with little ability or progress. This Probation officer sought additional information from pt and her husband (via phone). Per husband's report, pt currently sounds herself and his only concerned are around medical and physical needs. Additionally, both pt and her husband have provided a detailed description of basic cognitive functions within everyday life. As such goals have been adjusted to reflect this baseline ability. Skilled ST is indicated to ensure increased functional independence and reduce caregiver burden.      Intensity: Minumum of 1-2 x/day, 30 to 90 minutes Frequency: 3 to 5 out of 7 days Duration/Length of Stay: 3 weeks Treatment/Interventions: Cognitive remediation/compensation;Environmental controls;Internal/external aids;Therapeutic Activities;Patient/family education;Functional tasks;Cueing hierarchy   Daily Session  Skilled Therapeutic Interventions: Skilled treatment session focused on dysphagia and cognition goals. SLP facilitated session by providing skilled observation of pt consuming dysphagia 3 breakfast tray with thin liquids and regular diet items supplemented. Pt with good oral ability as evidenced  by visual lingual manipulation of bolus, complete oral clearing and no overt s/s of struggle. Despite this, pt requests to remain on dysphagia 3. Extensive education provided but  pt continues to assert that she is fine on dysphagia 3. Will discontinue that goal. Additionally, this writer spoke with pt's husband via phone.     General    Pain Pain Assessment Pain Scale: 0-10 Pain Score: 0-No pain  Therapy/Group: Individual Therapy  Briarrose Shor 11/16/2018, 10:36 AM

## 2018-11-16 NOTE — Progress Notes (Signed)
PHYSICAL MEDICINE & REHABILITATION PROGRESS NOTE   Subjective/Complaints: Pt has not been doing bedside exercise  Review of systems denies chest pain shortness of breath nausea vomiting diarrhea   Objective:   No results found. No results for input(s): WBC, HGB, HCT, PLT in the last 72 hours. No results for input(s): NA, K, CL, CO2, GLUCOSE, BUN, CREATININE, CALCIUM in the last 72 hours.  Intake/Output Summary (Last 24 hours) at 11/16/2018 0913 Last data filed at 11/15/2018 1823 Gross per 24 hour  Intake 400 ml  Output -  Net 400 ml     Physical Exam: Vital Signs Blood pressure 104/81, pulse (!) 103, temperature (!) 96.7 F (35.9 C), resp. rate 18, height 5\' 2"  (1.575 m), weight 89.4 kg, SpO2 99 %.   General: No acute distress Mood and affect are appropriate Heart: Regular rate and rhythm no rubs murmurs or extra sounds Lungs: Clear to auscultation, breathing unlabored, no rales or wheezes Abdomen: Positive bowel sounds, soft nontender to palpation, nondistended Extremities: No clubbing, cyanosis, or edema Skin: No evidence of breakdown, no evidence of rash, midline abdominal scar his pink granular tissue mainly epithelialized Neurologic: Cranial nerves II through XII intact, motor strength is 5/5 in right and 2 minus left deltoid, bicep, tricep, grip, hip flexor, knee extensors, ankle dorsiflexor and plantar flexor Visual fields are intact confrontation testing no evidence of left neglect Cerebellar exam normal right finger to nose to finger as well as heel to shin in  upper and lower extremities, unable to perform left side due to weakness Musculoskeletal: Full range of motion in all 4 extremities. No joint swelling       Assessment/Plan: 1. 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  Physiatrist is providing close  team supervision and 24 hour management of active medical problems listed below.  Physiatrist and rehab team continue to assess barriers to discharge/monitor patient progress toward functional and medical goals  Care Tool:  Bathing  Bathing activity did not occur: Refused Body parts bathed by patient: Chest, Abdomen, Right upper leg, Left upper leg, Face   Body parts bathed by helper: Right arm, Left arm, Front perineal area, Buttocks, Right lower leg, Left lower leg     Bathing assist Assist Level: Maximal Assistance - Patient 24 - 49%     Upper Body Dressing/Undressing Upper body dressing   What is the patient wearing?: Hospital gown only    Upper body assist Assist Level: Moderate Assistance - Patient 50 - 74%    Lower Body Dressing/Undressing Lower body dressing      What is the patient wearing?: Incontinence brief     Lower body assist Assist for lower body dressing: Maximal Assistance - Patient 25 - 49%     Toileting Toileting Toileting Activity did not occur (Clothing management and hygiene only): N/A (no void or bm)  Toileting assist Assist for toileting: Maximal Assistance - Patient 25 - 49%     Transfers Chair/bed transfer  Transfers assist  Chair/bed transfer activity did not occur: Safety/medical concerns  Chair/bed transfer assist level: Minimal Assistance - Patient > 75%     Locomotion Ambulation   Ambulation assist      Assist level: Minimal Assistance - Patient > 75% Assistive device: Walker-rolling Max distance: 30   Walk 10 feet activity   Assist     Assist level: Minimal Assistance - Patient >  75% Assistive device: Walker-rolling   Walk 50 feet activity   Assist Walk 50 feet with 2 turns activity did not occur: Safety/medical concerns         Walk 150 feet activity   Assist Walk 150 feet activity did not occur: Safety/medical concerns         Walk 10 feet on uneven surface  activity   Assist Walk 10 feet on  uneven surfaces activity did not occur: Safety/medical concerns         Wheelchair     Assist Will patient use wheelchair at discharge?: Yes Type of Wheelchair: Manual    Wheelchair assist level: Minimal Assistance - Patient > 75% Max wheelchair distance: 89ft     Wheelchair 50 feet with 2 turns activity    Assist        Assist Level: Minimal Assistance - Patient > 75%   Wheelchair 150 feet activity     Assist Wheelchair 150 feet activity did not occur: Safety/medical concerns        Medical Problem List and Plan: 1.Functional deficits and left hemiparesissecondary to embolic cva involving right corona radiata, right subcorital parietal lobe, left cerebellum CIR PT, OT, speech , Tent d/c 5/16   2. Antithrombotics: -DVT/anticoagulation:Mechanical:Sequential compression devices, below kneeBilateral lower extremities -antiplatelet therapy: Low dose ASA and . Plavix resumed platelets are now 269 K- no sign of bleeding 3. Pain Management:Tylenol prn 4. Mood:LCSW to follow for evaluation and support. -antipsychotic agents: N/A 5. Neuropsych: This patientiscapable of making decisions on herown behalf. 6. Skin/Wound Care:Monitor abdominal wound for recurrant drainage as mobility improves. Closed, granulation tissue is dry continue wet-to-dry dressings until scabs are removed-improving as above Will take another image next week  7. Fluids/Electrolytes/Nutrition:follow I's and O's. Check labs tomorrow 8. H/o Ovarian CA: Chemo on hold. 9. Reactive depression/Anxiety: ON Effexor and Paxil. 10. Hyperactive bladder: Continue ditropan. 11. Thrombocytopenia: Continue to monitor platelets with daily CBC. 12. H/o systolic CHF: Continue daily weights. 1800 cc FR. ON spironolactone and Crestor. Off Demadex and metoprolol at this time.  Filed Weights   11/13/18 0517 11/14/18 0618 11/16/18 0500  Weight: 89.7 kg 88.9 kg 89.4  kg  weight up today no evidence of increased edema, will monitor prior to med changes 13. Pancytopenia: Improved, still anemic, may reduce CBC to weekly Hgb stable at 8.4 14. Hypokalemia: Improved with supplementation.  4.2 on 11/09/2018    LOS: 7 days A FACE TO [place] Kaidynce Pfister 11/16/2018, 9:13 AM

## 2018-11-16 NOTE — Progress Notes (Addendum)
Physical Therapy Weekly Progress Note  Patient Details  Name: Destiny [name] MRN: [id number] Date of Birth: 09-06-1955  Beginning of progress report period: November 10, 2018 End of progress report period: Nov 16, 2018  Today's Date: 11/16/2018 PT Individual Time: 0900-1015 PT Individual Time Calculation (min): 75 min   Patient has met 4 of 4 short term goals.  Pt has made steady progress towards LTG over thre past week. Continues to demonstrate clonus in the LUE and R LE with significant exertion or stretching, but has progressed to mod assist with intermittient min assist for bed mobility, transfers, and ambulation up to 14f.   Patient continues to demonstrate the following deficits muscle weakness, muscle joint tightness and muscle paralysis, decreased cardiorespiratoy endurance, impaired timing and sequencing, abnormal tone, unbalanced muscle activation and decreased coordination, decreased attention to left, decreased initiation, decreased attention, decreased awareness, decreased problem solving, decreased safety awareness, decreased memory and delayed processing and decreased sitting balance, decreased standing balance, decreased postural control, hemiplegia and decreased balance strategies and therefore will continue to benefit from skilled PT intervention to increase functional independence with mobility.  Patient progressing toward long term goals..  Continue plan of care.  PT Short Term Goals Week 1:  PT Short Term Goal 1 (Week 1): Pt will perform bed mobility with mod assist  PT Short Term Goal 1 - Progress (Week 1): Met PT Short Term Goal 2 (Week 1): Pt will perfomed WC<>bed transfer with mod assist  PT Short Term Goal 2 - Progress (Week 1): Met PT Short Term Goal 3 (Week 1): Pt eill ambulate 155fwith mod assist of 1.  PT Short Term Goal 3 - Progress (Week 1): Met PT Short Term Goal 4 (Week 1): Pt will propell WC 10039fith min assist  PT Short Term Goal 4 - Progress (Week 1):  Met Week 2:  PT Short Term Goal 1 (Week 2): Pt will ambulate [id number] min assist and LRAD  PT Short Term Goal 2 (Week 2): Pt will propell WC [id number] min assist  PT Short Term Goal 3 (Week 2): Pt will perfomed bed mobilty with min assist PT  PT Short Term Goal 4 (Week 2): Pt will maintain standing balance x 3 minutes with min assist and LRAD   Skilled Therapeutic Interventions/Progress Updates:   Pt received supine in bed and agreeable to PT. Supine>sit transfer with mod assist and moderate cues for sequencing through log roll. Scooting to EOB with min assist and increased time due to poor motor planning.    Ambulatory transfer with RW and min assist from PT. Personal care at sink to wash face and arms with supervision assist while sitting in WC.   Doff and don soiled brief following incontinent bladder with sit<>stand at WC, [facility] assist overall with moderate cues for safety and posture. PT required to manage clothing.   Pt reports need for BM. Stand pivot transfert to toilet. Sitting balance sitting on toilet x 20min55m. Clonus noted in the RLE and LUE with bearing down by pt to attempt to clear stool. One near LOB, requiring min assist from PT to prevent anterior fall. PT required to perform all pericare with pat standing at RW. Stand pivot transfer to WC witAgcny East LLCmod assist to the L and moderate cues for improved step length on the L.   Reciprocal movement trianing to perform marching in place with  RW. 2 x 10 BLE. Mild L knee instability with full WB. Cues for posture, use  of UE and terminal knee extension.   WC mobility with mod assist to return to room x 97f. Cues for attention to the LUE throughout.  Patient returned to room and left sitting in [facility] Surgerywith call bell in reach and all needs met.         Therapy Documentation Precautions:  Precautions Precautions: Fall, Other (comment) Precaution Comments: Lt hemi, abdominal wound + Rt chest port  Restrictions Weight Bearing  Restrictions: Yes   Pain: deneis  Therapy/Group: Individual Therapy  ALorie Phenix5/07/2018, 10:16 AM

## 2018-11-16 NOTE — Progress Notes (Signed)
Physical Therapy Session Note  Patient Details  Name: Destiny [name] MRN: [id number] Date of Birth: [birth year]  Today's Date: 11/16/2018 PT Individual Time: 1100-1145 PT Individual Time Calculation (min): 45 min   Short Term Goals: Week 1:  PT Short Term Goal 1 (Week 1): Pt will perform bed mobility with mod assist  PT Short Term Goal 2 (Week 1): Pt will perfomed WC<>bed transfer with mod assist  PT Short Term Goal 3 (Week 1): Pt eill ambulate 30ft with mod assist of 1.  PT Short Term Goal 4 (Week 1): Pt will propell WC 139ft with min assist   Skilled Therapeutic Interventions/Progress Updates:     Patient in w/c upon PT arrival. Patient alert and agreeable to PT session.  Therapeutic Activity: Transfers: Patient performed sit to/from stand x4 with min A-CGA. Provided verbal cues for leaning forward before standing. Patient independently lifted L UE into splint on RW without use of R UE today x2.  Gait Training:  Patient ambulated 40 feet using RW with L hand splint with CGA with w/c behind. Ambulated with decreased gait speed, decreased step length and step height on L, increased toe drag on L with fatigue, pelvis rotated posteriorly on L, Bincreased hip and knee flexion, forward trunk lean, and downward head gaze. Provided verbal cues for erect posture, increased step length and height on L, and remaining close to the RW while ambulating.  Wheelchair Mobility:  Patient propelled wheelchair 40 feet with min A using B UEs and R foot for steering and propulsion. Provided verbal cues for increased use of L UE, turning technique, and avoiding obstacles on the L.  Neuromuscular Re-ed: Patient performed standing balance at the table at elevated height for 4 min x2 while placing UNO blocks of the same color in rows of 3 in number order 1-3 stacking a new colored row on top of each other forming 4 rows of 3 blocks stacked up. With the blocks piled on her L side, the patient used her R UE to  stack the first three rows and the L hand with 50% assist of the R hand to stack the last row. She chose correct colors and numbers with 100% accuracy throughout task.   Patient in w/c at end of session with breaks locked, seat belt alarm set, and all needs within reach.    Therapy Documentation Precautions:  Precautions Precautions: Fall, Other (comment) Precaution Comments: Lt hemi, abdominal wound + Rt chest port  Restrictions Weight Bearing Restrictions: No Pain: Patient denied pain throughout session.    Therapy/Group: Individual Therapy  Eric Morganti L Magdaline Zollars PT, DPT  11/16/2018, 3:33 PM

## 2018-11-16 NOTE — Progress Notes (Signed)
Occupational Therapy Session Note  Patient Details  Name: Destiny [name] MRN: [id number] Date of Birth: March 25, 1956  Today's Date: 11/16/2018 OT Individual Time: 1330-1430 OT Individual Time Calculation (min): 60 min   Short Term Goals: Week 1:  OT Short Term Goal 1 (Week 1): Pt will complete self care EOB with Mod A for lose of balance recovery  OT Short Term Goal 2 (Week 1): Pt will don shirt with Mod A using hemi dressing strategies  OT Short Term Goal 3 (Week 1): Pt will use Lt hand as gross stabilizer during grooming tasks with Mod A to maintain functional grasp  Skilled Therapeutic Interventions/Progress Updates:    Pt greeted in w/c with no c/o pain. Declining participation in bathing or toileting tasks. Agreeable to complete grooming tasks at the sink after OT donned Teds and gripper socks with Total A. Worked on sit<stands, standing balance, and Lt NMR while completing oral care, hand washing, lotion application, and hair combing. 4 sit<stands in total, with pt requiring steady assist for power up, and Min-Mod A for controlled descents. Min-Mod A for standing balance with Lt knee supported. Noted increased trembling/buckling when weight shifting towards Lt or forward to retrieve needed items. Active assist needed when reaching towards back of sink using L UE and for maintaining functional grasp 50% of the time. L UE was incorporated as gross stabilizer and gross assist during stated tasks. Noted tremulous L UE movement at times. Vcs also required for letting OT know when she needed to sit as well. While seated, used L UE to stabilize phone while she called spouse to ask him to bring in clothing items. For remainder of session, provided pt with foam sponge and yellow theraputty to improve functional grasp/release. She practiced using both activity items during session, with education emphasis placed on controlled release following squeezes (to balance flexor/extensor muscle activity) and joint  protection when elevating L UE onto bedside table. At end of session pt was left in w/c with all needs within reach and safety belt fastened.        Therapy Documentation Precautions:  Precautions Precautions: Fall, Other (comment) Precaution Comments: Lt hemi, abdominal wound + Rt chest port  Restrictions Weight Bearing Restrictions: No Vital Signs: Therapy Vitals Temp: 99 F (37.2 C) Temp Source: Oral Pulse Rate: (!) 109 Resp: 18 BP: 113/80 Oxygen Therapy SpO2: 100 % O2 Device: Room Air Pain: No c/o pain during session    ADL: ADL Grooming: Supervision/safety Where Assessed-Grooming: Chair Upper Body Bathing: Minimal assistance Where Assessed-Upper Body Bathing: Edge of bed Lower Body Bathing: Maximal assistance Where Assessed-Lower Body Bathing: Edge of bed Upper Body Dressing: Maximal assistance Where Assessed-Upper Body Dressing: Edge of bed Lower Body Dressing: Maximal assistance Where Assessed-Lower Body Dressing: Edge of bed Toileting: Not assessed Toilet Transfer: Not assessed Social research officer, government: Not assessed      Therapy/Group: Individual Therapy  Medhansh Brinkmeier A Shadana Pry 11/16/2018, 4:55 PM

## 2018-11-17 ENCOUNTER — Inpatient Hospital Stay (HOSPITAL_COMMUNITY): Payer: Self-pay | Admitting: Occupational Therapy

## 2018-11-17 ENCOUNTER — Inpatient Hospital Stay (HOSPITAL_COMMUNITY): Payer: Self-pay | Admitting: Physical Therapy

## 2018-11-17 DIAGNOSIS — D62 Acute posthemorrhagic anemia: Secondary | ICD-10-CM

## 2018-11-17 DIAGNOSIS — I5022 Chronic systolic (congestive) heart failure: Secondary | ICD-10-CM

## 2018-11-17 DIAGNOSIS — E876 Hypokalemia: Secondary | ICD-10-CM

## 2018-11-17 NOTE — Progress Notes (Signed)
Slept well throughout the night. CPAP in place. No complaints verbalized. No prn medications.

## 2018-11-17 NOTE — Progress Notes (Signed)
Speech Language Pathology Daily Session Note  Patient Details  Name: FAELYNN WYNDER MRN: [id number] Date of Birth: 1956/01/30  Today's Date: 11/17/2018 SLP Individual Time: 1015-1045 SLP Individual Time Calculation (min): 30 min  Short Term Goals: Week 2: SLP Short Term Goal 1 (Week 2): Patient will demonstrate functional problem solving for basic and familiar tasks with supervision cues.  SLP Short Term Goal 2 (Week 2): Patient will recall new, daily information with use of external aids with Min A verbal and visual cues.  SLP Short Term Goal 3 (Week 2): Patient will demonstrate selective attention to functional tasks for ~30 minutes with Min A verbal cues for redirection in a mildly distracting enviornment.  SLP Short Term Goal 4 (Week 2): Patient will identify 2 physical and 2 cognitive deficits with supervision verbal cues.   Skilled Therapeutic Interventions: Skilled treatment session focused on cognitive goals. SLP facilitated session by providing extra time and overall Min A verbal cues for functional problem solving during a basic money management task. Patient performed tasks in a mildly distracting environment and was able to select her attention for ~25 minutes with overall supervision level verbal cues. Patient also appeared brighter, more engaged and demonstrated more timely verbal responses from when this clinician last saw the patient. Patient left upright in the wheelchair with alarm on and all needs within reach. Continue with current plan of care.      Pain No/Denies Pain   Therapy/Group: Individual Therapy  Carder Yin 11/17/2018, 2:13 PM

## 2018-11-17 NOTE — Progress Notes (Signed)
Occupational Therapy Session Note  Patient Details  Name: Destiny [name] MRN: [id number] Date of Birth: [birth year]  Today's Date: 11/17/2018 OT Individual Time: 5009-3818 OT Individual Time Calculation (min): 55 min    Short Term Goals: Week 1:  OT Short Term Goal 1 (Week 1): Pt will complete self care EOB with Mod A for lose of balance recovery  OT Short Term Goal 2 (Week 1): Pt will don shirt with Mod A using hemi dressing strategies  OT Short Term Goal 3 (Week 1): Pt will use Lt hand as gross stabilizer during grooming tasks with Mod A to maintain functional grasp  Skilled Therapeutic Interventions/Progress Updates:    Upon entering the room, pt supine in bed with no c/o pain and agreeable to OT intervention. Pt transferred into wheelchair with mod A squat pivot transfer. OT assisted pt via wheelchair into bathroom. Mod A squat pivot transfer onto elevated toilet. Pt able to void and initially attempting to wipe self with L UE but dropped toilet paper multiple times. Pt needing mod cuing for problem solving for hygiene. Pt standing and leaning forward and weight bearing on L hand in wheelchair seat while performing hygiene with R hand and min A for standing balance. Pt transferred back to wheelchair in same manner as above. Pt utilizing figure four position with assistance to thread pants over feet. Pt standing with mod lifting assistance and needing assist to pull pants over B hips. Pt donning pull over shirt with mod cuing for hemiplegic dressing technique. Pt engaged in grooming tasks at sink for B coordination while seated. Pt remaining in wheelchair with call bell and all needed items within reach. Chair belt alarm donned for safety.  Therapy Documentation Precautions:  Precautions Precautions: Fall, Other (comment) Precaution Comments: Lt hemi, abdominal wound + Rt chest port  Restrictions Weight Bearing Restrictions: No General:   Vital Signs:   Pain: Pain Assessment Pain  Scale: 0-10 Pain Score: 0-No pain ADL: ADL Grooming: Supervision/safety Where Assessed-Grooming: Chair Upper Body Bathing: Minimal assistance Where Assessed-Upper Body Bathing: Edge of bed Lower Body Bathing: Maximal assistance Where Assessed-Lower Body Bathing: Edge of bed Upper Body Dressing: Maximal assistance Where Assessed-Upper Body Dressing: Edge of bed Lower Body Dressing: Maximal assistance Where Assessed-Lower Body Dressing: Edge of bed Toileting: Not assessed Toilet Transfer: Not assessed Social research officer, government: Not assessed Vision   Perception    Praxis   Exercises:   Other Treatments:     Therapy/Group: Individual Therapy  Gypsy Decant 11/17/2018, 9:56 AM

## 2018-11-17 NOTE — Progress Notes (Signed)
Patient place don CPAP and doing well. No issues at this time 

## 2018-11-17 NOTE — Progress Notes (Signed)
Physical Therapy Session Note  Patient Details  Name: Destiny [name] MRN: [id number] Date of Birth: [birth year]  Today's Date: 11/17/2018 PT Individual Time: 1100-1200 PT Individual Time Calculation (min): 60 min   Short Term Goals: Week 2:  PT Short Term Goal 1 (Week 2): Pt will ambulate 50f with min assist and LRAD  PT Short Term Goal 2 (Week 2): Pt will propell WC 1059fwith min assist  PT Short Term Goal 3 (Week 2): Pt will perfomed bed mobilty with min assist PT  PT Short Term Goal 4 (Week 2): Pt will maintain standing balance x 3 minutes with min assist and LRAD   Skilled Therapeutic Interventions/Progress Updates:   Pt received sitting in WC and agreeable to PT  [facility] x 50 ft with min assist from PT for safety. Additional gait training with RW x 2048fith CGA and min cues for step length on the L.   PT instructed pt in dynamic standing balance to perform reciprocal foot taps on 2inch step with UE support on RW  Core control NMR to perform overhead ball rasie 2 x 10. Trunk rotation to tap ball on yoge block at pt hips 2 x 8 Bil. Mod assist on the LLE to maintian grasp on ball and for improved upright posture and L side trunk activation.   PT instructed pt in blocked practice bed mobility.  Sit<>supine x 3 Rolling R and L x 4 bil   Lateral scooting EOB. X 4 R and L  Min-mod assist and mod-max multimodal cues for sequencing of movements, improved activation of trunk and attention to the LUE and LLE.   Stand pivot transfer to the L with RW and min assist from PT to [facility] room and left sitting in WC [facility] call bell in reach and all needs met.         Therapy Documentation Precautions:  Precautions Precautions: Fall, Other (comment) Precaution Comments: Lt hemi, abdominal wound + Rt chest port  Restrictions Weight Bearing Restrictions: No Vital Signs: Therapy  Vitals Temp: 98.4 F (36.9 C) Pulse Rate: (!) 108 Resp: (!) 24 BP: 114/79 Patient Position (if appropriate): Sitting Oxygen Therapy SpO2: 98 % O2 Device: Room Air Pain: denies   Therapy/Group: Individual Therapy  AusLorie Phenix2/2020, 2:59 PM

## 2018-11-17 NOTE — Progress Notes (Signed)
[place] PHYSICAL MEDICINE & REHABILITATION PROGRESS NOTE   Subjective/Complaints: Patient seen laying in bed this morning.  He is a little slow to arouse.  She states she slept well overnight.  Review of systems: Denies CP, SOB, N/V/D  Objective:   No results found. No results for input(s): WBC, HGB, HCT, PLT in the last 72 hours. No results for input(s): NA, K, CL, CO2, GLUCOSE, BUN, CREATININE, CALCIUM in the last 72 hours.  Intake/Output Summary (Last 24 hours) at 11/17/2018 0951 Last data filed at 11/17/2018 0406 Gross per 24 hour  Intake 240 ml  Output 200 ml  Net 40 ml     Physical Exam: Vital Signs Blood pressure 108/75, pulse 99, temperature 98 F (36.7 C), resp. rate 16, height 5\' 2"  (1.575 m), weight 89.9 kg, SpO2 97 %. Constitutional: No distress . Vital signs reviewed. HENT: Normocephalic.  Atraumatic. Eyes: EOMI. No discharge. Cardiovascular: No JVD. Respiratory: Normal effort. GI: Non-distended. Musc: No edema or tenderness in extremities. Neurologic: Slow to arouse Motor: LUE/LLE: Grossly 2/5 proximal to distal   Assessment/Plan: 1. 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  Physiatrist is providing close team supervision and 24 hour management of active medical problems listed below.  Physiatrist and rehab team continue to assess barriers to discharge/monitor patient progress toward functional and medical goals  Care Tool:  Bathing  Bathing activity did not occur: Refused Body parts bathed by patient: Chest, Abdomen, Right upper leg, Left upper leg, Face   Body parts bathed by helper: Right arm, Left arm, Front perineal area, Buttocks, Right lower leg, Left lower leg     Bathing assist Assist Level: Maximal Assistance - Patient 24 - 49%     Upper Body Dressing/Undressing Upper body dressing   What is  the patient wearing?: Hospital gown only    Upper body assist Assist Level: Moderate Assistance - Patient 50 - 74%    Lower Body Dressing/Undressing Lower body dressing      What is the patient wearing?: Incontinence brief     Lower body assist Assist for lower body dressing: Maximal Assistance - Patient 25 - 49%     Toileting Toileting Toileting Activity did not occur (Clothing management and hygiene only): N/A (no void or bm)  Toileting assist Assist for toileting: Maximal Assistance - Patient 25 - 49%     Transfers Chair/bed transfer  Transfers assist  Chair/bed transfer activity did not occur: Safety/medical concerns  Chair/bed transfer assist level: Minimal Assistance - Patient > 75%     Locomotion Ambulation   Ambulation assist      Assist level: Contact Guard/Touching assist Assistive device: Walker-rolling Max distance: 40   Walk 10 feet activity   Assist     Assist level: Contact Guard/Touching assist Assistive device: Walker-rolling   Walk 50 feet activity   Assist Walk 50 feet with 2 turns activity did not occur: Safety/medical concerns         Walk 150 feet activity   Assist Walk 150 feet activity did not occur: Safety/medical concerns         Walk 10 feet on uneven surface  activity   Assist Walk 10 feet on uneven surfaces activity did not occur: Safety/medical concerns         Wheelchair     Assist Will patient use wheelchair at discharge?: Yes Type of Wheelchair: Manual  Wheelchair assist level: Minimal Assistance - Patient > 75% Max wheelchair distance: 35'    Wheelchair 50 feet with 2 turns activity    Assist        Assist Level: Minimal Assistance - Patient > 75%   Wheelchair 150 feet activity     Assist Wheelchair 150 feet activity did not occur: Safety/medical concerns        Medical Problem List and Plan: 1.Functional deficits and left hemiparesissecondary to embolic cva  involving right corona radiata, right subcorital parietal lobe, left cerebellum Continue CIR 2. Antithrombotics: -DVT/anticoagulation:Mechanical:Sequential compression devices, below kneeBilateral lower extremities -antiplatelet therapy: Low dose ASA and Plavix resumed 3. Pain Management:Tylenol prn 4. Mood:LCSW to follow for evaluation and support. -antipsychotic agents: N/A 5. Neuropsych: This patientiscapable of making decisions on herown behalf. 6. Skin/Wound Care:Monitor abdominal wound for recurrant drainage as mobility improves. Continue wet-to-dry dressings until scabs are removed 7. Fluids/Electrolytes/Nutrition:follow I's and O's.  8. H/o Ovarian CA: Chemo on hold. 9. Reactive depression/Anxiety: ON Effexor and Paxil. 10. Hyperactive bladder: Continue Ditropan. 11. Thrombocytopenia:: Resolved  Platelets 269 on 4/28 12.  Chronic systolic CHF: Continue daily weights. 1800 cc FR. ON spironolactone and Crestor. Off Demadex and metoprolol at this time.  Filed Weights   11/14/18 0618 11/16/18 0500 11/17/18 0353  Weight: 88.9 kg 89.4 kg 89.9 kg   Stable on 5/2 13. Pancytopenia: Improved, still anemic  Hgb stable at 8.4 on 4/28 14. Hypokalemia: Improved with supplementation.    4.2 on 11/09/2018    LOS: 8 days A FACE TO FACE EVALUATION WAS PERFORMED   [name] 11/17/2018, 9:51 AM

## 2018-11-18 ENCOUNTER — Inpatient Hospital Stay (HOSPITAL_COMMUNITY): Payer: Self-pay

## 2018-11-18 NOTE — Progress Notes (Signed)
Occupational Therapy Session Note  Patient Details  Name: Destiny [name] MRN: [id number] Date of Birth: Oct 12, 1955  Today's Date: 11/18/2018 OT Individual Time: 1045-1200 OT Individual Time Calculation (min): 75 min    Short Term Goals: Week 1:  OT Short Term Goal 1 (Week 1): Pt will complete self care EOB with Mod A for lose of balance recovery  OT Short Term Goal 2 (Week 1): Pt will don shirt with Mod A using hemi dressing strategies  OT Short Term Goal 3 (Week 1): Pt will use Lt hand as gross stabilizer during grooming tasks with Mod A to maintain functional grasp  Skilled Therapeutic Interventions/Progress Updates:    Session focused on hemi bathing/dressing, toileting tasks, and LUE NMR. Pt received supine requesting to use bathroom. Pt completed stand pivot transfer toward R side with mod A > w/c > BSC over toilet. Moderate cueing required for proper UE placement. Pt voided bowel/bladder on commode and stood with min A to complete peri hygiene. Mod A for standing balance support required when pt released R UE from grab bar to attempt and wipe. Ultimately mod A overall required to assist with peri hygiene and clothing management. Pt returned to w/c and completed UB bathing at sink. Pt required min HOH facilitation for LUE to cross midline to wash under RUE. Mod A to don shirt with heavy cueing for hemi technique. Pt required mod A to thread BLE into pants, mod A to pull up in standing. Pt was transported to therapy gym in w/c. In gym, pt transferred to mat with RW, mod A. Pt sat with poor midline orientation and obvious weight shift over R hip. Mirror place anterior as visual aid and pt able to correct with mod tactile/verbal cues. Pt completed functional reaching with R and L UE to encourage weight shifting over B hips. A wedge was obtained and placed under pt and facilitated more upright posture. Pt completed closed chain BUE forward reaching both seated and in supine, with mod HOH  facilitation and L hand secured to dowel. Pt returned to room and was left sitting up in chair, chair alarm set. No c/o pain throughout session.   Therapy Documentation Precautions:  Precautions Precautions: Fall, Other (comment) Precaution Comments: Lt hemi, abdominal wound + Rt chest port  Restrictions Weight Bearing Restrictions: No  Therapy/Group: Individual Therapy  Curtis Sites 11/18/2018, 12:03 PM

## 2018-11-18 NOTE — Progress Notes (Signed)
[place] PHYSICAL MEDICINE & REHABILITATION PROGRESS NOTE   Subjective/Complaints: Patient seen laying in bed this morning.  She states she slept well overnight.  She notes she is still sleepy.  Review of systems: Denies CP, SOB, N/V/D  Objective:   No results found. No results for input(s): WBC, HGB, HCT, PLT in the last 72 hours. No results for input(s): NA, K, CL, CO2, GLUCOSE, BUN, CREATININE, CALCIUM in the last 72 hours.  Intake/Output Summary (Last 24 hours) at 11/18/2018 1012 Last data filed at 11/17/2018 1905 Gross per 24 hour  Intake 360 ml  Output -  Net 360 ml     Physical Exam: Vital Signs Blood pressure 117/83, pulse 97, temperature 97.8 F (36.6 C), temperature source Axillary, resp. rate 20, height 5\' 2"  (1.575 m), weight 87.6 kg, SpO2 100 %. Constitutional: No distress . Vital signs reviewed. HENT: Normocephalic.  Atraumatic. Eyes: EOMI.  No discharge. Cardiovascular: No JVD. Respiratory: Normal effort. GI: Non-distended. Musc: No edema or tenderness in extremities. Neurologic: Sleepy Motor: LUE/LLE: Grossly 2/5 proximal to distal, limited due to participation, but appears unchanged.   Assessment/Plan: 1. 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  Physiatrist is providing close team supervision and 24 hour management of active medical problems listed below.  Physiatrist and rehab team continue to assess barriers to discharge/monitor patient progress toward functional and medical goals  Care Tool:  Bathing  Bathing activity did not occur: Refused Body parts bathed by patient: Chest, Abdomen, Right upper leg, Left upper leg, Face   Body parts bathed by helper: Right arm, Left arm, Front perineal area, Buttocks, Right lower leg, Left lower leg     Bathing assist Assist Level: Maximal Assistance - Patient 24 -  49%     Upper Body Dressing/Undressing Upper body dressing   What is the patient wearing?: Pull over shirt    Upper body assist Assist Level: Moderate Assistance - Patient 50 - 74%    Lower Body Dressing/Undressing Lower body dressing      What is the patient wearing?: Pants, Incontinence brief     Lower body assist Assist for lower body dressing: Maximal Assistance - Patient 25 - 49%     Toileting Toileting Toileting Activity did not occur (Clothing management and hygiene only): N/A (no void or bm)  Toileting assist Assist for toileting: Maximal Assistance - Patient 25 - 49%     Transfers Chair/bed transfer  Transfers assist  Chair/bed transfer activity did not occur: Safety/medical concerns  Chair/bed transfer assist level: Minimal Assistance - Patient > 75%     Locomotion Ambulation   Ambulation assist      Assist level: Contact Guard/Touching assist Assistive device: Walker-rolling Max distance: 40   Walk 10 feet activity   Assist     Assist level: Contact Guard/Touching assist Assistive device: Walker-rolling   Walk 50 feet activity   Assist Walk 50 feet with 2 turns activity did not occur: Safety/medical concerns         Walk 150 feet activity   Assist Walk 150 feet activity did not occur: Safety/medical concerns         Walk 10 feet on uneven surface  activity   Assist Walk 10 feet on uneven surfaces activity did not occur: Safety/medical concerns         Wheelchair     Assist Will patient use wheelchair at discharge?:  Yes Type of Wheelchair: Manual    Wheelchair assist level: Minimal Assistance - Patient > 75% Max wheelchair distance: 29'    Wheelchair 50 feet with 2 turns activity    Assist        Assist Level: Minimal Assistance - Patient > 75%   Wheelchair 150 feet activity     Assist Wheelchair 150 feet activity did not occur: Safety/medical concerns        Medical Problem List and  Plan: 1.Functional deficits and left hemiparesissecondary to embolic cva involving right corona radiata, right subcorital parietal lobe, left cerebellum Continue CIR 2. Antithrombotics: -DVT/anticoagulation:Mechanical:Sequential compression devices, below kneeBilateral lower extremities -antiplatelet therapy: Low dose ASA and Plavix resumed 3. Pain Management:Tylenol prn 4. Mood:LCSW to follow for evaluation and support. -antipsychotic agents: N/A 5. Neuropsych: This patientiscapable of making decisions on herown behalf. 6. Skin/Wound Care:Monitor abdominal wound for recurrant drainage as mobility improves. Continue wet-to-dry dressings until scabs are removed 7. Fluids/Electrolytes/Nutrition:follow I's and O's.  8. H/o Ovarian CA: Chemo on hold. 9. Reactive depression/Anxiety: ON Effexor and Paxil. 10. Hyperactive bladder: Continue Ditropan. 11. Thrombocytopenia:: Resolved  Platelets 269 on 4/28 12.  Chronic systolic CHF: Continue daily weights. 1800 cc FR. ON spironolactone and Crestor. Off Demadex and metoprolol at this time.  Filed Weights   11/16/18 0500 11/17/18 0353 11/18/18 0440  Weight: 89.4 kg 89.9 kg 87.6 kg   Improving on 5/3 13. Pancytopenia: Improved, still anemic  Hgb stable at 8.4 on 4/28, labs ordered for tomorrow 14. Hypokalemia: Improved with supplementation.    Potassium 4.3 on 4/27  LOS: 9 days A FACE TO FACE EVALUATION WAS PERFORMED  Ankit [name] 11/18/2018, 10:12 AM

## 2018-11-18 NOTE — Plan of Care (Signed)
  Problem: Consults Goal: RH STROKE PATIENT EDUCATION Description See Patient Education module for education specifics  Outcome: Progressing   Problem: RH BOWEL ELIMINATION Goal: RH STG MANAGE BOWEL WITH ASSISTANCE Description STG Manage Bowel with min Assistance.  Outcome: Progressing   Problem: RH BLADDER ELIMINATION Goal: RH STG MANAGE BLADDER WITH ASSISTANCE Description STG Manage Bladder With min Assistance  Outcome: Progressing   Problem: RH SKIN INTEGRITY Goal: RH STG SKIN FREE OF INFECTION/BREAKDOWN Description Patients skin will remain free from further infection or breakdown with mod assist.  Outcome: Progressing Goal: RH STG MAINTAIN SKIN INTEGRITY WITH ASSISTANCE Description STG Maintain Skin Integrity With mod Assistance.  Outcome: Progressing Goal: RH STG ABLE TO PERFORM INCISION/WOUND CARE W/ASSISTANCE Description STG Able To Perform Incision/Wound Care With mod/max Assistance.  Outcome: Progressing   Problem: RH KNOWLEDGE DEFICIT Goal: RH STG INCREASE KNOWLEDGE OF HYPERTENSION Description Patient/caregiver will verbalize understanding of HTN including medications, diet, exercise, monitoring, and follow up care with min assist.  Outcome: Progressing Goal: RH STG INCREASE KNOWLEGDE OF HYPERLIPIDEMIA Description Patient/caregiver will verbalize understanding of HLD including medications, diet, exercise, monitoring, and follow up care with min assist.  Outcome: Progressing Goal: RH STG INCREASE KNOWLEDGE OF STROKE PROPHYLAXIS Description Patient/caregiver will verbalize understanding of stroke prophylaxis including medications, diet, exercise, monitoring, and follow up care with min assist.  Outcome: Progressing

## 2018-11-19 ENCOUNTER — Inpatient Hospital Stay (HOSPITAL_COMMUNITY): Payer: Self-pay | Admitting: Speech Pathology

## 2018-11-19 ENCOUNTER — Inpatient Hospital Stay (HOSPITAL_COMMUNITY): Payer: Self-pay | Admitting: Physical Therapy

## 2018-11-19 ENCOUNTER — Inpatient Hospital Stay (HOSPITAL_COMMUNITY): Payer: Self-pay | Admitting: Occupational Therapy

## 2018-11-19 LAB — BASIC METABOLIC PANEL
Anion gap: 10 (ref 5–15)
BUN: 7 mg/dL — ABNORMAL LOW (ref 8–23)
CO2: 22 mmol/L (ref 22–32)
Calcium: 9.5 mg/dL (ref 8.9–10.3)
Chloride: 108 mmol/L (ref 98–111)
Creatinine, Ser: 0.66 mg/dL (ref 0.44–1.00)
GFR calc Af Amer: >60 mL/min (ref >60)
GFR calc non Af Amer: >60 mL/min (ref >60)
Glucose, Bld: 98 mg/dL (ref 70–99)
Potassium: 4.1 mmol/L (ref 3.5–5.1)
Sodium: 140 mmol/L (ref 135–145)

## 2018-11-19 LAB — CBC
HCT: 27.9 % — ABNORMAL LOW (ref 36.0–46.0)
Hemoglobin: 8.8 g/dL — ABNORMAL LOW (ref 12.0–15.0)
MCH: 29.3 pg (ref 26.0–34.0)
MCHC: 31.5 g/dL (ref 30.0–36.0)
MCV: 93 fL (ref 80.0–100.0)
Platelets: 383 10*3/uL (ref 150–400)
RBC: 3 MIL/uL — ABNORMAL LOW (ref 3.87–5.11)
RDW: 15.8 % — ABNORMAL HIGH (ref 11.5–15.5)
WBC: 6.1 10*3/uL (ref 4.0–10.5)
nRBC: 0 % (ref 0.0–0.2)

## 2018-11-19 NOTE — Progress Notes (Signed)
[place] PHYSICAL MEDICINE & REHABILITATION PROGRESS NOTE   Subjective/Complaints:  No issues overnite, eating breakfast asking about D/C date Review of systems: Denies CP, SOB, N/V/D  Objective:   No results found. Recent Labs    11/19/18 0529  WBC 6.1  HGB 8.8*  HCT 27.9*  PLT 383   Recent Labs    11/19/18 0529  NA 140  K 4.1  CL 108  CO2 22  GLUCOSE 98  BUN 7*  CREATININE 0.66  CALCIUM 9.5    Intake/Output Summary (Last 24 hours) at 11/19/2018 4818 Last data filed at 11/19/2018 0100 Gross per 24 hour  Intake -  Output 150 ml  Net -150 ml     Physical Exam: Vital Signs Blood pressure 115/84, pulse 95, temperature (!) 97.5 F (36.4 C), temperature source Oral, resp. rate 16, height 5\' 2"  (1.575 m), weight 88.7 kg, SpO2 99 %. Constitutional: No distress . Vital signs reviewed. HENT: Normocephalic.  Atraumatic. Eyes: EOMI.  No discharge. Cardiovascular: No JVD. Respiratory: Normal effort. GI: Non-distended. Musc: No edema or tenderness in extremities. Neurologic:alert mild disarthria, increased latency of response Motor: LUE/LLE: Grossly 2/5 proximal to distal, limited due to participation, but appears unchanged.   Assessment/Plan: 1. 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  Physiatrist is providing close team supervision and 24 hour management of active medical problems listed below.  Physiatrist and rehab team continue to assess barriers to discharge/monitor patient progress toward functional and medical goals  Care Tool:  Bathing  Bathing activity did not occur: Refused Body parts bathed by patient: Chest, Abdomen, Right upper leg, Left upper leg, Face, Right arm, Left arm, Front perineal area, Buttocks   Body parts bathed by helper: Right arm, Left arm, Front perineal area, Buttocks, Right lower leg,  Left lower leg     Bathing assist Assist Level: Moderate Assistance - Patient 50 - 74%     Upper Body Dressing/Undressing Upper body dressing   What is the patient wearing?: Pull over shirt    Upper body assist Assist Level: Moderate Assistance - Patient 50 - 74%    Lower Body Dressing/Undressing Lower body dressing      What is the patient wearing?: Pants, Incontinence brief     Lower body assist Assist for lower body dressing: Moderate Assistance - Patient 50 - 74%     Toileting Toileting Toileting Activity did not occur (Clothing management and hygiene only): N/A (no void or bm)  Toileting assist Assist for toileting: Moderate Assistance - Patient 50 - 74%     Transfers Chair/bed transfer  Transfers assist  Chair/bed transfer activity did not occur: Safety/medical concerns  Chair/bed transfer assist level: Moderate Assistance - Patient 50 - 74%     Locomotion Ambulation   Ambulation assist      Assist level: Contact Guard/Touching assist Assistive device: Walker-rolling Max distance: 40   Walk 10 feet activity   Assist     Assist level: Contact Guard/Touching assist Assistive device: Walker-rolling   Walk 50 feet activity   Assist Walk 50 feet with 2 turns activity did not occur: Safety/medical concerns         Walk 150 feet activity   Assist Walk 150 feet activity did not occur: Safety/medical concerns         Walk 10 feet on uneven surface  activity   Assist Walk 10 feet on uneven  surfaces activity did not occur: Safety/medical concerns         Wheelchair     Assist Will patient use wheelchair at discharge?: Yes Type of Wheelchair: Manual    Wheelchair assist level: Minimal Assistance - Patient > 75% Max wheelchair distance: 63'    Wheelchair 50 feet with 2 turns activity    Assist        Assist Level: Minimal Assistance - Patient > 75%   Wheelchair 150 feet activity     Assist Wheelchair 150 feet  activity did not occur: Safety/medical concerns        Medical Problem List and Plan: 1.Functional deficits and left hemiparesissecondary to embolic cva involving right corona radiata, right subcorital parietal lobe, left cerebellum Continue CIR PT, OT, SLP 2. Antithrombotics: -DVT/anticoagulation:Mechanical:Sequential compression devices, below kneeBilateral lower extremities -antiplatelet therapy: Low dose ASA and Plavix resumed, hgb 8.8 3. Pain Management:Tylenol prn 4. Mood:LCSW to follow for evaluation and support. -antipsychotic agents: N/A 5. Neuropsych: This patientiscapable of making decisions on herown behalf. 6. Skin/Wound Care:Monitor abdominal wound for recurrant drainage as mobility improves. Continue wet-to-dry dressings until scabs are removed 7. Fluids/Electrolytes/Nutrition:follow I's and O's.  8. H/o Ovarian CA: Chemo on hold. 9. Reactive depression/Anxiety: ON Effexor and Paxil. 10. Hyperactive bladder: Continue Ditropan. 11. Thrombocytopenia:: Resolved  Platelets 269 on 4/28, plt 383 on 5/4 12.  Chronic systolic CHF: Continue daily weights. 1800 cc FR. ON spironolactone and Crestor. Off Demadex and metoprolol at this time.  Filed Weights   11/17/18 0353 11/18/18 0440 11/19/18 0600  Weight: 89.9 kg 87.6 kg 88.7 kg  stable 5/4 13. Pancytopenia: Improved, still anemic  Hgb stable at 8.4 on 4/28, improved to 8.8 on 5/4 14. Hypokalemia: Improved with supplementation.    Potassium 4.1on 5/4  LOS: 10 days A FACE TO [place] E Kirsteins 11/19/2018, 8:08 AM

## 2018-11-19 NOTE — Plan of Care (Signed)
  Problem: Consults Goal: RH STROKE PATIENT EDUCATION Description: See Patient Education module for education specifics  Outcome: Progressing   

## 2018-11-19 NOTE — Progress Notes (Signed)
Occupational Therapy Session Note  Patient Details  Name: Destiny [name] MRN: [id number] Date of Birth: Aug 19, 1955  Today's Date: 11/19/2018 OT Individual Time: 1445-1515 OT Individual Time Calculation (min): 30 min    Short Term Goals: Week 2:  OT Short Term Goal 1 (Week 2): Pt will complete toilet transfer with Min A and LRAD OT Short Term Goal 2 (Week 2): Pt will complete LB dressing with Mod A using hemi dressing techniques  OT Short Term Goal 3 (Week 2): Pt will complete 1 grooming task in standing to improve standing balance/tolerance   Skilled Therapeutic Interventions/Progress Updates:    Upon entering the room, pt seated in wheelchair with no c/o pain and agreeable to OT intervention. Focus on L UE NMR and strengthening this session. Pt engaged in finger isolation exercises with increased difficulty with the 4th digit. OT demonstrated and educated pt on use of yellow resistive theraputty for coordination and strengthening. Pt needin min multimodal cuing for correct technique and rest breaks secondary to hand fatigue. Pt remained in wheelchair at end of session with chair alarm belt donned and call bell within reach.   Therapy Documentation Precautions:  Precautions Precautions: Fall, Other (comment) Precaution Comments: Lt hemi, abdominal wound + Rt chest port  Restrictions Weight Bearing Restrictions: No Vital Signs: Therapy Vitals Pulse Rate: (!) 116 Resp: 19 BP: 123/78 Patient Position (if appropriate): Sitting Oxygen Therapy SpO2: 100 % O2 Device: Room Air ADL: ADL Grooming: Supervision/safety Where Assessed-Grooming: Chair Upper Body Bathing: Minimal assistance Where Assessed-Upper Body Bathing: Edge of bed Lower Body Bathing: Maximal assistance Where Assessed-Lower Body Bathing: Edge of bed Upper Body Dressing: Maximal assistance Where Assessed-Upper Body Dressing: Edge of bed Lower Body Dressing: Maximal assistance Where Assessed-Lower Body Dressing: Edge  of bed Toileting: Not assessed Toilet Transfer: Not assessed Social research officer, government: Not assessed   Therapy/Group: Individual Therapy  Gypsy Decant 11/19/2018, 4:15 PM

## 2018-11-19 NOTE — Progress Notes (Signed)
Speech Language Pathology Daily Session Note  Patient Details  Name: AYONNA SPERANZA MRN: [id number] Date of Birth: 18-Jul-1956  Today's Date: 11/19/2018 SLP Individual Time: 4481-8563 SLP Individual Time Calculation (min): 45 min  Short Term Goals: Week 2: SLP Short Term Goal 1 (Week 2): Patient will demonstrate functional problem solving for basic and familiar tasks with supervision cues.  SLP Short Term Goal 2 (Week 2): Patient will recall new, daily information with use of external aids with Min A verbal and visual cues.  SLP Short Term Goal 3 (Week 2): Patient will demonstrate selective attention to functional tasks for ~30 minutes with Min A verbal cues for redirection in a mildly distracting enviornment.  SLP Short Term Goal 4 (Week 2): Patient will identify 2 physical and 2 cognitive deficits with supervision verbal cues.   Skilled Therapeutic Interventions:  Skilled treatment session focused on cognition goals. SLP facilitated session by providing supervision cues to complete problem solving task targeting recall of words and mental manipulation of words. Pt requires extended time (over age related peers) but produces correct answer and is focused on task during extended time (I.e. she doesn't loose track of assignment). Pt with greater insight into physical and cognitive deficits as she is able to state 3 physical deficits with general questions. Pt able to state that her "memory probably isn't as good." Pt was left upright in bed, bed alarm on and all needs within reach. Continue per current plan of care.      Pain Pain Assessment Pain Scale: 0-10 Pain Score: 0-No pain Faces Pain Scale: No hurt  Therapy/Group: Individual Therapy  Jaydalynn Olivero 11/19/2018, 9:04 AM

## 2018-11-19 NOTE — Plan of Care (Signed)
  Problem: Consults Goal: RH STROKE PATIENT EDUCATION Description See Patient Education module for education specifics  Outcome: Progressing   Problem: RH BOWEL ELIMINATION Goal: RH STG MANAGE BOWEL WITH ASSISTANCE Description STG Manage Bowel with min Assistance.  Outcome: Progressing   Problem: RH BLADDER ELIMINATION Goal: RH STG MANAGE BLADDER WITH ASSISTANCE Description STG Manage Bladder With min Assistance  Outcome: Progressing   Problem: RH SKIN INTEGRITY Goal: RH STG SKIN FREE OF INFECTION/BREAKDOWN Description Patients skin will remain free from further infection or breakdown with mod assist.  Outcome: Progressing Goal: RH STG MAINTAIN SKIN INTEGRITY WITH ASSISTANCE Description STG Maintain Skin Integrity With mod Assistance.  Outcome: Progressing Goal: RH STG ABLE TO PERFORM INCISION/WOUND CARE W/ASSISTANCE Description STG Able To Perform Incision/Wound Care With mod/max Assistance.  Outcome: Progressing   Problem: RH KNOWLEDGE DEFICIT Goal: RH STG INCREASE KNOWLEDGE OF HYPERTENSION Description Patient/caregiver will verbalize understanding of HTN including medications, diet, exercise, monitoring, and follow up care with min assist.  Outcome: Progressing Goal: RH STG INCREASE KNOWLEGDE OF HYPERLIPIDEMIA Description Patient/caregiver will verbalize understanding of HLD including medications, diet, exercise, monitoring, and follow up care with min assist.  Outcome: Progressing Goal: RH STG INCREASE KNOWLEDGE OF STROKE PROPHYLAXIS Description Patient/caregiver will verbalize understanding of stroke prophylaxis including medications, diet, exercise, monitoring, and follow up care with min assist.  Outcome: Progressing

## 2018-11-19 NOTE — Progress Notes (Signed)
Occupational Therapy Weekly Progress Note  Patient Details  Name: Destiny [name] MRN: [id number] Date of Birth: [birth year]  Beginning of progress report period: 11/10/18 End of progress report period: 11/19/18  Today's Date: 11/19/2018 OT Individual Time: 9678-9381 OT Individual Time Calculation (min): 72 min    Patient has met 3 of 3 short term goals.    Pt is making excellent progress towards LTGs. At time of evaluation, she required Max A for functional squat pivot transfers and self care tasks. At time of report, she requires Mod A for squat and stand pivot bathroom transfers, Min A for sit<stand at sink, and Mod-Max A with self care tasks overall at sit<stand level. She is on track for LTG achievement set at [place] level. Continue POC.   Patient continues to demonstrate the following deficits: muscle weakness, decreased cardiorespiratoy endurance, unbalanced muscle activation, decreased coordination and decreased motor planning, decreased initiation, decreased attention, decreased awareness, decreased problem solving, decreased safety awareness and delayed processing and decreased sitting balance, decreased standing balance, decreased postural control, hemiplegia and decreased balance strategies and therefore will continue to benefit from skilled OT intervention to enhance overall performance with BADL.  Patient progressing toward long term goals..  Continue plan of care.  OT Short Term Goals Week 1:  OT Short Term Goal 1 (Week 1): Pt will complete self care EOB with Mod A for lose of balance recovery  OT Short Term Goal 1 - Progress (Week 1): Met OT Short Term Goal 2 (Week 1): Pt will don shirt with Mod A using hemi dressing strategies  OT Short Term Goal 2 - Progress (Week 1): Met OT Short Term Goal 3 (Week 1): Pt will use Lt hand as gross stabilizer during grooming tasks with Mod A to maintain functional grasp OT Short Term Goal 3 - Progress (Week 1): Met Week 2:  OT Short  Term Goal 1 (Week 2): Pt will complete toilet transfer with Min A and LRAD OT Short Term Goal 2 (Week 2): Pt will complete LB dressing with Mod A using hemi dressing techniques  OT Short Term Goal 3 (Week 2): Pt will complete 1 grooming task in standing to improve standing balance/tolerance      Skilled Therapeutic Interventions/Progress Updates:    Pt greeted in bed with no c/o pain. Agreeable to participate in BADLs during tx. She completed bathing (sit<stand from EOB using RW), dressing (sit<stand w/c level at sink), and oral care/grooming tasks (sitting at sink) during tx. Tx focus placed on sitting/standing balance, Lt NMR, functional transfers, and ADL retraining. Pt lost her balance posteriorally 3-4 times while completing bathing (towards end, due to fatigue and trunk weakness). With vcs to use bedrail and baseboard for pulling up, she was able to recover from LOBs with Min A. Pt able to wash Rt forearm using Lt, and also able to reach with affected arm towards basin and help Rt hand with squeezing out wash cloths. At time of eval, she needed active assist from OT to do this! Sit<stand with Mod A, able to complete a little bit of frontal perihygiene using Lt but required assist for thoroughness. She also needed help with reaching buttocks. Pt then completed squat pivot transfer<w/c with Mod A to resume dressing at sink. Min A for donning overhead shirt, with pt initiating hemi dressing technique. Max A for donning pants, with assist required for maintaining figure 4 position bilaterally. Tried to have her assist with donning gripper socks, and able to don Lt gripper  sock with Mod A given increased time. Teds donned Total A. Min A sit<stand at sink to elevate pants over hips. HOH for maintaining Lt hand grasp to assist, and she actively tried to involve affected arm throughout. She still exhibits a hand grasp that is too weak to be functional during dressing. Mod A for using Lt as gross stabilizer  during oral care and grooming tasks. She was able to use affected arm to turn faucet levers on/off without physical assist 90% of the time. Pt was left in w/c at end of session with all needs within reach and safety belt fastened. RN and NT made aware to check on pt periodically due to her tendency to slide forward in chair and being unaware to correct posture as needed.       Therapy Documentation Precautions:  Precautions Precautions: Fall, Other (comment) Precaution Comments: Lt hemi, abdominal wound + Rt chest port  Restrictions Weight Bearing Restrictions: No Vital Signs: Therapy Vitals Temp: (!) 97.5 F (36.4 C) Temp Source: Oral Pulse Rate: 95 BP: 115/84 Patient Position (if appropriate): Lying Oxygen Therapy SpO2: 99 % O2 Device: Room Air ADL: ADL Grooming: Supervision/safety Where Assessed-Grooming: Chair Upper Body Bathing: Minimal assistance Where Assessed-Upper Body Bathing: Edge of bed Lower Body Bathing: Maximal assistance Where Assessed-Lower Body Bathing: Edge of bed Upper Body Dressing: Maximal assistance Where Assessed-Upper Body Dressing: Edge of bed Lower Body Dressing: Maximal assistance Where Assessed-Lower Body Dressing: Edge of bed Toileting: Not assessed Toilet Transfer: Not assessed Social research officer, government: Not assessed      Therapy/Group: Individual Therapy  Donovon Micheletti A Artavious Trebilcock 11/19/2018, 7:16 AM

## 2018-11-19 NOTE — Progress Notes (Signed)
Physical Therapy Session Note  Patient Details  Name: Destiny [name] MRN: [id number] Date of Birth: [birth year]  Today's Date: 11/19/2018 PT Individual Time: 1147-1228 PT Individual Time Calculation (min): 41 min   Short Term Goals: Week 2:  PT Short Term Goal 1 (Week 2): Pt will ambulate 76ft with min assist and LRAD  PT Short Term Goal 2 (Week 2): Pt will propell WC 128ft with min assist  PT Short Term Goal 3 (Week 2): Pt will perfomed bed mobilty with min assist PT  PT Short Term Goal 4 (Week 2): Pt will maintain standing balance x 3 minutes with min assist and LRAD   Skilled Therapeutic Interventions/Progress Updates:  Pt received in w/c & agreeable to tx. Tremors noted in pt's LUE & RLE but pt reports this isn't new. Per OT pt was sliding out of w/c over weekend so therapist dumped w/c. Pt transfers sit<>stand throughout session with min assist and occasional cuing for hand placement. Pt does require cuing for anterior weight shifting to scoot buttocks back in w/c seat and min assist to fully scoot back. Pt ambulates 35 ft with RW & L hand orthosis (therapist manages LUE on orthosis) with min assist with pt demonstrating LLE foot drag 50% of the time with therapist providing facilitation at L hips for increased step length LLE and cuing for dorsiflexion & heel strike LLE. Pt demonstrates impaired weight shifting L<>R during gait as well. Throughout session therapist provides cuing to attend to LUE 2/2 slight inattention. Pt utilized kinetron in sitting then progressed to standing with BUE & min assist with cuing for upright posture with task focusing on dynamic balance & weight shifting L<>R, LLE NMR, & BLE strengthening. Pt attempts to propel w/c back to room with BUE but requires max assist for short distance 2/2 impaired neuromuscular control of LUE. At end of session pt left sitting in w/c with chair alarm donned & all needs at hand.   Therapy Documentation Precautions:   Precautions Precautions: Fall, Other (comment) Precaution Comments: Lt hemi, abdominal wound + Rt chest port  Restrictions Weight Bearing Restrictions: No  Pain: No c/o pain.   Therapy/Group: Individual Therapy  Waunita Schooner 11/19/2018, 12:47 PM

## 2018-11-19 NOTE — Plan of Care (Signed)
  Problem: Consults Goal: [facility] STROKE PATIENT EDUCATION Description See Patient Education module for education specifics  11/19/2018 1632 by Adria Devon, LPN Outcome: Progressing 11/19/2018 1631 by Ellison Carwin A, LPN Outcome: Progressing   Problem: RH BOWEL ELIMINATION Goal: RH STG MANAGE BOWEL WITH ASSISTANCE Description STG Manage Bowel with min Assistance.  11/19/2018 1632 by Adria Devon, LPN Outcome: Progressing 11/19/2018 1631 by Adria Devon, LPN Outcome: Progressing   Problem: RH BLADDER ELIMINATION Goal: RH STG MANAGE BLADDER WITH ASSISTANCE Description STG Manage Bladder With min Assistance  11/19/2018 1632 by Adria Devon, LPN Outcome: Progressing 11/19/2018 1631 by Adria Devon, LPN Outcome: Progressing   Problem: RH SKIN INTEGRITY Goal: RH STG SKIN FREE OF INFECTION/BREAKDOWN Description Patients skin will remain free from further infection or breakdown with mod assist.  11/19/2018 1632 by Adria Devon, LPN Outcome: Progressing 11/19/2018 1631 by Adria Devon, LPN Outcome: Progressing Goal: RH STG MAINTAIN SKIN INTEGRITY WITH ASSISTANCE Description STG Maintain Skin Integrity With mod Assistance.  11/19/2018 1632 by Adria Devon, LPN Outcome: Progressing 11/19/2018 1631 by Adria Devon, LPN Outcome: Progressing Goal: RH STG ABLE TO PERFORM INCISION/WOUND CARE W/ASSISTANCE Description STG Able To Perform Incision/Wound Care With mod/max Assistance.  11/19/2018 1632 by Adria Devon, LPN Outcome: Progressing 11/19/2018 1631 by Adria Devon, LPN Outcome: Progressing   Problem: RH KNOWLEDGE DEFICIT Goal: RH STG INCREASE KNOWLEDGE OF HYPERTENSION Description Patient/caregiver will verbalize understanding of HTN including medications, diet, exercise, monitoring, and follow up care with min assist.  11/19/2018 1632 by Adria Devon, LPN Outcome: Progressing 11/19/2018 1631 by Adria Devon, LPN Outcome:  Progressing Goal: RH STG INCREASE KNOWLEGDE OF HYPERLIPIDEMIA Description Patient/caregiver will verbalize understanding of HLD including medications, diet, exercise, monitoring, and follow up care with min assist.  11/19/2018 1632 by Adria Devon, LPN Outcome: Progressing 11/19/2018 1631 by Adria Devon, LPN Outcome: Progressing Goal: RH STG INCREASE KNOWLEDGE OF STROKE PROPHYLAXIS Description Patient/caregiver will verbalize understanding of stroke prophylaxis including medications, diet, exercise, monitoring, and follow up care with min assist.  11/19/2018 1632 by Adria Devon, LPN Outcome: Progressing 11/19/2018 1631 by Adria Devon, LPN Outcome: Progressing

## 2018-11-20 ENCOUNTER — Inpatient Hospital Stay (HOSPITAL_COMMUNITY): Payer: Self-pay

## 2018-11-20 ENCOUNTER — Inpatient Hospital Stay (HOSPITAL_COMMUNITY): Payer: Self-pay | Admitting: Physical Therapy

## 2018-11-20 ENCOUNTER — Inpatient Hospital Stay (HOSPITAL_COMMUNITY): Payer: Self-pay | Admitting: Speech Pathology

## 2018-11-20 NOTE — Progress Notes (Signed)
Speech Language Pathology Daily Session Note  Patient Details  Name: Destiny [name] MRN: [id number] Date of Birth: Jun 03, 1956  Today's Date: 11/20/2018 SLP Individual Time: 0830-0925 SLP Individual Time Calculation (min): 55 min  Short Term Goals: Week 2: SLP Short Term Goal 1 (Week 2): Patient will demonstrate functional problem solving for basic and familiar tasks with supervision cues.  SLP Short Term Goal 2 (Week 2): Patient will recall new, daily information with use of external aids with Min A verbal and visual cues.  SLP Short Term Goal 3 (Week 2): Patient will demonstrate selective attention to functional tasks for ~30 minutes with Min A verbal cues for redirection in a mildly distracting enviornment.  SLP Short Term Goal 4 (Week 2): Patient will identify 2 physical and 2 cognitive deficits with supervision verbal cues.   Skilled Therapeutic Interventions: Skilled treatment session focused on cognitive goals. SLP facilitated session by providing Min A verbal cues for functional problem solving during a mildly complex appointment/calendar making task. However, patient demonstrated selective attention to task in a mildly distracting environment for ~45 minutes with overall Mod I. Patient left upright in bed with alarm on and all needs within reach. Continue with current plan of care.       Pain Pain Assessment Pain Scale: 0-10 Pain Score: 0-No pain  Therapy/Group: Individual Therapy  Zackry Deines 11/20/2018, 12:46 PM

## 2018-11-20 NOTE — Progress Notes (Signed)
Patient refused to be toileted, this RN went went into her room multiple times but she continue to refuse.

## 2018-11-20 NOTE — Progress Notes (Signed)
[place] PHYSICAL MEDICINE & REHABILITATION PROGRESS NOTE   Subjective/Complaints:  No issues overnite, had BM  Review of systems: Denies CP, SOB, N/V/D  Objective:   No results found. Recent Labs    11/19/18 0529  WBC 6.1  HGB 8.8*  HCT 27.9*  PLT 383   Recent Labs    11/19/18 0529  NA 140  K 4.1  CL 108  CO2 22  GLUCOSE 98  BUN 7*  CREATININE 0.66  CALCIUM 9.5    Intake/Output Summary (Last 24 hours) at 11/20/2018 0814 Last data filed at 11/19/2018 1900 Gross per 24 hour  Intake 570 ml  Output -  Net 570 ml     Physical Exam: Vital Signs Blood pressure 105/72, pulse (!) 106, temperature 97.6 F (36.4 C), temperature source Oral, resp. rate 19, height 5\' 2"  (1.575 m), weight 90.1 kg, SpO2 100 %. Constitutional: No distress . Vital signs reviewed. HENT: Normocephalic.  Atraumatic. Eyes: EOMI.  No discharge. Cardiovascular: No JVD. Respiratory: Normal effort. GI: Non-distended. Musc: No edema or tenderness in extremities. Neurologic:alert mild disarthria, increased latency of response Motor: LUE/LLE: Grossly 2/5 proximal to distal, limited due to participation, but appears unchanged. 4/5 RUE/RLE   Assessment/Plan: 1. 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  Physiatrist is providing close team supervision and 24 hour management of active medical problems listed below.  Physiatrist and rehab team continue to assess barriers to discharge/monitor patient progress toward functional and medical goals  Care Tool:  Bathing  Bathing activity did not occur: Refused Body parts bathed by patient: Chest, Abdomen, Right upper leg, Left upper leg, Face, Left arm, Buttocks   Body parts bathed by helper: Right arm, Right lower leg, Left lower leg, Front perineal area, Buttocks     Bathing assist Assist Level: Moderate  Assistance - Patient 50 - 74%     Upper Body Dressing/Undressing Upper body dressing   What is the patient wearing?: Pull over shirt    Upper body assist Assist Level: Minimal Assistance - Patient > 75%    Lower Body Dressing/Undressing Lower body dressing      What is the patient wearing?: Pants, Incontinence brief     Lower body assist Assist for lower body dressing: Maximal Assistance - Patient 25 - 49%     Toileting Toileting Toileting Activity did not occur (Clothing management and hygiene only): N/A (no void or bm)  Toileting assist Assist for toileting: Moderate Assistance - Patient 50 - 74%     Transfers Chair/bed transfer  Transfers assist  Chair/bed transfer activity did not occur: Safety/medical concerns  Chair/bed transfer assist level: Moderate Assistance - Patient 50 - 74%     Locomotion Ambulation   Ambulation assist      Assist level: Minimal Assistance - Patient > 75% Assistive device: Walker-rolling(L hand orthosis) Max distance: 35 ft   Walk 10 feet activity   Assist     Assist level: Minimal Assistance - Patient > 75% Assistive device: Walker-rolling(L hand orthosis)   Walk 50 feet activity   Assist Walk 50 feet with 2 turns activity did not occur: Safety/medical concerns         Walk 150 feet activity   Assist Walk 150 feet activity did not occur: Safety/medical concerns         Walk 10 feet on uneven surface  activity   Assist Walk 10 feet  on uneven surfaces activity did not occur: Safety/medical concerns         Wheelchair     Assist Will patient use wheelchair at discharge?: Yes Type of Wheelchair: Manual    Wheelchair assist level: Maximal Assistance - Patient 25 - 49% Max wheelchair distance: 20 ft    Wheelchair 50 feet with 2 turns activity    Assist        Assist Level: Minimal Assistance - Patient > 75%   Wheelchair 150 feet activity     Assist Wheelchair 150 feet activity did  not occur: Safety/medical concerns        Medical Problem List and Plan: 1.Functional deficits and left hemiparesissecondary to embolic cva involving right corona radiata, right subcorital parietal lobe, left cerebellum Continue CIR PT, OT, SLP, team conf in am 2. Antithrombotics: -DVT/anticoagulation:Mechanical:Sequential compression devices, below kneeBilateral lower extremities -antiplatelet therapy: Low dose ASA and Plavix resumed, hgb 8.8 3. Pain Management:Tylenol prn 4. Mood:LCSW to follow for evaluation and support. -antipsychotic agents: N/A 5. Neuropsych: This patientiscapable of making decisions on herown behalf. 6. Skin/Wound Care:Monitor abdominal wound for recurrant drainage as mobility improves. Continue wet-to-dry dressings until scabs are removed 7. Fluids/Electrolytes/Nutrition:follow I's and O's. BMET nl 5/5 8. H/o Ovarian CA: Chemo on hold. 9. Reactive depression/Anxiety: ON Effexor and Paxil. 10. Hyperactive bladder: Continue Ditropan. 11. Thrombocytopenia:: Resolved  Platelets 269 on 4/28, plt 383 on 5/4 12.  Chronic systolic CHF: Continue daily weights. 1800 cc FR. ON spironolactone and Crestor. Off Demadex and metoprolol at this time.  Filed Weights   11/18/18 0440 11/19/18 0600 11/20/18 0500  Weight: 87.6 kg 88.7 kg 90.1 kg  trending up, ?technique 13. Pancytopenia: Improved, still anemic  Hgb stable at 8.4 on 4/28, improved to 8.8 on 5/4 14. Hypokalemia: Improved with supplementation.    Potassium 4.1on 5/4  LOS: 11 days A FACE TO FACE EVALUATION WAS PERFORMED  Charlett Blake 11/20/2018, 8:14 AM

## 2018-11-20 NOTE — Progress Notes (Signed)
Physical Therapy Session Note  Patient Details  Name: Destiny [name] MRN: [id number] Date of Birth: February 03, 1956  Today's Date: 11/20/2018 PT Individual Time: 1093-2355 PT Individual Time Calculation (min): 60 min   Short Term Goals: Week 2:  PT Short Term Goal 1 (Week 2): Pt will ambulate 68ft with min assist and LRAD  PT Short Term Goal 2 (Week 2): Pt will propell WC 135ft with min assist  PT Short Term Goal 3 (Week 2): Pt will perfomed bed mobilty with min assist PT  PT Short Term Goal 4 (Week 2): Pt will maintain standing balance x 3 minutes with min assist and LRAD   Skilled Therapeutic Interventions/Progress Updates:    Patient in supine agreeable to PT.  Noted bed soaked with urine so assisted to sit min A seated to comb her hair EOB with S.  Then up in [place] min A to sink to assist with perineal hygiene max A in standing in [place].  Sit to stand from w/c to sink mod A and balancing min to mod A while pulling up pants with max A.  Patient in w/c to complete upper body dressing with min A for pull over shirt.  Patient in w/c assisted to dayroom.  Sit to stand and ambulation mod to min A with RW and cues for turning to sit safely x 4 x about 8'.  Patient in Kinetron x 4 rounds of 1 minute each at about 40 cm/sec with cues for full extension on L.  Patient transferred to stand to RW and ambulated about 60' with RW and min to mod A w/c following.  Patient assisted in w/c to room and left in chair with alarm belt activated and call bell/needs in reach.   Therapy Documentation Precautions:  Precautions Precautions: Fall, Other (comment) Precaution Comments: Lt hemi, abdominal wound + Rt chest port  Restrictions Weight Bearing Restrictions: No Pain: Pain Assessment Pain Scale: 0-10 Pain Score: 0-No pain    Therapy/Group: Individual Therapy  Reginia Naas  Magda Kiel, PT 11/20/2018, 10:37 AM

## 2018-11-20 NOTE — Progress Notes (Addendum)
Physical Therapy Session Note  Patient Details  Name: Destiny [name] MRN: [id number] Date of Birth: 14-Jun-1956  Today's Date: 11/20/2018 PT Individual Time: 1134-1232 PT Individual Time Calculation (min): 58 min   Short Term Goals: Week 2:  PT Short Term Goal 1 (Week 2): Pt will ambulate 59ft with min assist and LRAD  PT Short Term Goal 2 (Week 2): Pt will propell WC 164ft with min assist  PT Short Term Goal 3 (Week 2): Pt will perfomed bed mobilty with min assist PT  PT Short Term Goal 4 (Week 2): Pt will maintain standing balance x 3 minutes with min assist and LRAD   Skilled Therapeutic Interventions/Progress Updates:    Pt received sitting in w/c and agreeable to therapy session. Pt reports she is disappointed she isn't able to go home sooner and states she wishes she could go home today. Pt transported in w/c to therapy gym. Pt ambulated ~74ft using RW with min assist/CGA for balance and AD management - pt able to place L UE on RW throughout session without physical assist - demonstrates decreased B LE step length . Pt performed sit<>stand from w/c/EOM to RW/no AD with min assist/CGA for lifting/balance throughout session. Pt started alternate B LE foot taps on 2" step with min/mod assist for balance and noted to be incontinent of bladder in brief. Pt transported back to room in w/c. Pt ambulated ~79ft into/out of bathroom using RW with min/mod assist for balance due to unlevel surfaces and mod cuing for AD management due to pt's decreased safety awareness. Performed sit<>stand from [facility] over toilet with min assist for lifting. Performed LB clothing management with max assist and peri-care total assist - small wound bandage from inferior boarder of abdominal wound fell to the ground with RN present and aware. Pt transported in w/c to therapy gym. Performed stand pivot transfer w/c<>EOM using RW with CGA/min assist for balance and cuing for increased safety awareness and AD management. Performed 2  bouts of repeated L LE step-up on 2" step - attempted without UE support but pt unable, performed 1 set with RW and progressed to R HHA from 2nd person for 2nd set - cuing and manual facilitation for increased L LE quad and gluteal activation for L LE NMR and strengthening. Performed 2 bouts of repeated R LE tap up/down on 4" step with B UE support on RW focusing on L LE NMR of quadriceps and gluteal activation for stance phase of gait. Pt suddenly became emotional during session starting to cry, verbalizing she is upset that she will not be home for Mother's Day - therapist provided emotional support and encouragement on the importance of being as independent as possible prior to discharging home - SW notified for neuropsych consult. Pt ambulated 77ft x2, seated break between, using RW with CGA/min assist for balance - pt demonstrated worsening L LE foot clearance with increased fatigue requiring mod cuing for correction. Pt transported back to room in w/c and left sitting in w/c with needs in reach and seat belt alarm on.    Therapy Documentation Precautions:  Precautions Precautions: Fall, Other (comment) Precaution Comments: Lt hemi, abdominal wound + Rt chest port  Restrictions Weight Bearing Restrictions: No  Pain: No reports of pain during session.   Therapy/Group: Individual Therapy  Tawana Scale, PT, DPT 11/20/2018, 8:55 AM

## 2018-11-20 NOTE — Progress Notes (Signed)
Occupational Therapy Session Note  Patient Details  Name: Destiny [name] MRN: [id number] Date of Birth: 21-Sep-1955  Today's Date: 11/20/2018 OT Individual Time: 1500-1545 OT Individual Time Calculation (min): 45 min    Short Term Goals: Week 2:  OT Short Term Goal 1 (Week 2): Pt will complete toilet transfer with Min A and LRAD OT Short Term Goal 2 (Week 2): Pt will complete LB dressing with Mod A using hemi dressing techniques  OT Short Term Goal 3 (Week 2): Pt will complete 1 grooming task in standing to improve standing balance/tolerance   Skilled Therapeutic Interventions/Progress Updates:    Session focused on LUE NMR. Pt received in w/c with no c/o pain, agreeable to session. Initially pt agreed to ADLs at sink and then after washing face declined any further bathing/dressing. Pt was transported to [employer] room to work on functional reaching and scanning. Pt required mod manual facilitation for L shoulder flex and stabilization 2/2 tremors to reach buttons anteriorly. Frequent cueing required throughout for L UE positioning. Pt then transitioned to sitting EOM where her L UE was placed in the mobile arm support with one counterweight. Pt completed guided functional reaching anteriorly with grasp and release of large discs. Pt required min A to pronate wrist to release discs. Pt ended with BUE (only L UE in mobile arm support), horizontal abduction/adduction with a focus on scapular activation with tactile cueing. Pt returned to room and left sitting up with all needs met, chair alarm belt fastened.   Therapy Documentation Precautions:  Precautions Precautions: Fall, Other (comment) Precaution Comments: Lt hemi, abdominal wound + Rt chest port  Restrictions Weight Bearing Restrictions: No   Therapy/Group: Individual Therapy  Curtis Sites 11/20/2018, 4:39 PM

## 2018-11-21 ENCOUNTER — Inpatient Hospital Stay (HOSPITAL_COMMUNITY): Payer: Self-pay | Admitting: Physical Therapy

## 2018-11-21 ENCOUNTER — Inpatient Hospital Stay (HOSPITAL_COMMUNITY): Payer: Self-pay

## 2018-11-21 ENCOUNTER — Inpatient Hospital Stay (HOSPITAL_COMMUNITY): Payer: Self-pay | Admitting: Occupational Therapy

## 2018-11-21 NOTE — Progress Notes (Signed)
Social Work Patient ID: Destiny [name], female   DOB: July 23, 1955, 63 y.o.   MRN: [id number] Met with pt and spoke with husband via telephone to discuss team conference progression toward her goals and moving up discharge date to 5/14. Discussed him coming in for education prior to DC home. Scheduled for Tuesday 5/12 10:00 for education. Will work on discharge needs and equipment. Pt is very pleased with the change in date.

## 2018-11-21 NOTE — Progress Notes (Signed)
Physical Therapy Session Note  Patient Details  Name: ZEPHANIAH ENYEART MRN: [id number] Date of Birth: 1955/08/03  Today's Date: 11/21/2018 PT Individual Time: 0805-0850 PT Individual Time Calculation (min): 45 min   Short Term Goals: Week 2:  PT Short Term Goal 1 (Week 2): Pt will ambulate 66f with min assist and LRAD  PT Short Term Goal 2 (Week 2): Pt will propell WC 1071fwith min assist  PT Short Term Goal 3 (Week 2): Pt will perfomed bed mobilty with min assist PT  PT Short Term Goal 4 (Week 2): Pt will maintain standing balance x 3 minutes with min assist and LRAD   Skilled Therapeutic Interventions/Progress Updates: Pt presented in bed sleeping requiring increased time for arousal and agreeable to therapy. Pt required increased time light mod A supine to sit. Pt required cues sequencing and assist for truncal support. Performed stand pivot transfer with RW to w/c minA. In w/c nsg arrived to provide meds with pt taking all meds with diet coke. Pt then transported to day room and participated in Cybex Kinetron 40 cm/sec 2 bouts of 3 min ea with cues for increasing full extension in LLE. Pt then ambulated 3063fnd [id number] RW and CGA fading to minA with fatigue. Pt transported remaining distance in w/c and remained in w/c at end of session with belt alarm on, call bell within reach and needs met.      Therapy Documentation Precautions:  Precautions Precautions: Fall, Other (comment) Precaution Comments: Lt hemi, abdominal wound + Rt chest port  Restrictions Weight Bearing Restrictions: No    Therapy/Group: Individual Therapy  Kellin Bartling  Sallyanne Birkhead, PTA  11/21/2018, 1:30 PM

## 2018-11-21 NOTE — Progress Notes (Signed)
Speech Language Pathology Daily Session Note  Patient Details  Name: Destiny [name] MRN: [id number] Date of Birth: 1955/11/08  Today's Date: 11/21/2018 SLP Individual Time: 3570-1779 SLP Individual Time Calculation (min): 42 min  Short Term Goals: Week 2: SLP Short Term Goal 1 (Week 2): Patient will demonstrate functional problem solving for basic and familiar tasks with supervision cues.  SLP Short Term Goal 2 (Week 2): Patient will recall new, daily information with use of external aids with Min A verbal and visual cues.  SLP Short Term Goal 3 (Week 2): Patient will demonstrate selective attention to functional tasks for ~30 minutes with Min A verbal cues for redirection in a mildly distracting enviornment.  SLP Short Term Goal 4 (Week 2): Patient will identify 2 physical and 2 cognitive deficits with supervision verbal cues.   Skilled Therapeutic Interventions: Skilled ST services focused on cognitive skills. SLP facilitated basic problem solving and recall skills utilizing card task played at simplest level, pt required min a verbals fade to supervision A verbal cues for both problem solving and recall during second round of task. Pt demonstrated selective attention mod I in 40 minute interval. Pt was left in room with call bell within reach and chair alarm set. ST recommends to continue skilled ST services.      Pain Pain Assessment Pain Scale: 0-10 Pain Score: 0-No pain  Therapy/Group: Individual Therapy  Gavyn Zoss  [facility] 11/21/2018, 12:19 PM

## 2018-11-21 NOTE — Progress Notes (Signed)
Physical Therapy Session Note  Patient Details  Name: Destiny [name] MRN: [id number] Date of Birth: 04-14-1956  Today's Date: 11/21/2018 PT Individual Time: 1535-1630 PT Individual Time Calculation (min): 55 min   Short Term Goals: Week 2:  PT Short Term Goal 1 (Week 2): Pt will ambulate 73ft with min assist and LRAD  PT Short Term Goal 2 (Week 2): Pt will propell WC 116ft with min assist  PT Short Term Goal 3 (Week 2): Pt will perfomed bed mobilty with min assist PT  PT Short Term Goal 4 (Week 2): Pt will maintain standing balance x 3 minutes with min assist and LRAD   Skilled Therapeutic Interventions/Progress Updates:   Pt received sitting in WC and agreeable to PT. Pt transported to day room in [facility]  Nustep reciprocal movement training x 6 min + 4 minutes with supervision assist from for safety and with min-mod cues for attention to task, improved ROM in the LLE, and symmetry in speed of movement.   Gait in to and from nustep x 55ft with RW, hand orthotic min assist overall from PT. Cues for posture, step length, and step width in turns. Pt noted to have significantly improved gait pattern in turns to the R.   Stand pivot transfer to mat table with RW and min assist overall. PT required to proved mod cues for set up, UEW placement and proper use of hand splint on the L prior to each transfer. Sitting balance EOB x 5 minuted with distant supervision assist from PT. Reaching R and L slightly outside BOS with supervision assist from PT.   WC adjustmet.to lower frond wheels improve safety of transfer to and from [facility] due to pt/s short stature pt noted to have incontinent bladder while sitting EOM in gym. Returned to room.  Attempted clothing management pt refused assist from PT for balance while initiating lower body clothing managemnet with LOB back to seat. Pt continued to refused assistance from PT. RN notified of pt incontinence and pt left sitting in [facility] with call bell in reach and alarm in  place.       Therapy Documentation Precautions:  Precautions Precautions: Fall, Other (comment) Precaution Comments: Lt hemi, abdominal wound + Rt chest port  Restrictions Weight Bearing Restrictions: No Pain:   deines   Therapy/Group: Individual Therapy  [name] 11/21/2018, 4:47 PM

## 2018-11-21 NOTE — Progress Notes (Signed)
Occupational Therapy Session Note  Patient Details  Name: Destiny [name] MRN: [id number] Date of Birth: [birth year]  Today's Date: 11/21/2018 OT Individual Time: 5631-4970 OT Individual Time Calculation (min): 57 min    Short Term Goals: Week 2:  OT Short Term Goal 1 (Week 2): Pt will complete toilet transfer with Min A and LRAD OT Short Term Goal 2 (Week 2): Pt will complete LB dressing with Mod A using hemi dressing techniques  OT Short Term Goal 3 (Week 2): Pt will complete 1 grooming task in standing to improve standing balance/tolerance   Skilled Therapeutic Interventions/Progress Updates:    Upon entering the room, pt seated in wheelchair with no c/o pain and agreeable to OT intervention. Pt requesting to wash at sink and change clothing items. Pt engaged in bathing UB while seated with hand over hand assistance to utilize L UE to wash R UE. Pt noted to have increased tremor with use of L UE as well during functional tasks. Pt needing mod A and min cuing for hemiplegic dressing technique. Pt standing with mod lifting assistance at sink with mod cuing for hand placement and to lock wheelchair for safety. Pt unable to wash buttocks or peri area without assistance secondary to body habitus. Pt maintaining min A for stand balance during LB clothing management. Pt seated in wheelchair at sink for grooming tasks with supervision, min cuing for sequencing, and increased time. RN arrived to change abdominal dressing and pt remaining in wheelchair with call bell and all needed items within reach. Chair alarm belt donned.   Therapy Documentation Precautions:  Precautions Precautions: Fall, Other (comment) Precaution Comments: Lt hemi, abdominal wound + Rt chest port  Restrictions Weight Bearing Restrictions: No Pain: Pain Assessment Pain Scale: 0-10 Pain Score: 0-No pain ADL: ADL Grooming: Supervision/safety Where Assessed-Grooming: Chair Upper Body Bathing: Minimal assistance Where  Assessed-Upper Body Bathing: Edge of bed Lower Body Bathing: Maximal assistance Where Assessed-Lower Body Bathing: Edge of bed Upper Body Dressing: Maximal assistance Where Assessed-Upper Body Dressing: Edge of bed Lower Body Dressing: Maximal assistance Where Assessed-Lower Body Dressing: Edge of bed Toileting: Not assessed Toilet Transfer: Not assessed Social research officer, government: Not assessed   Therapy/Group: Individual Therapy  Gypsy Decant 11/21/2018, 12:08 PM

## 2018-11-21 NOTE — Patient Care Conference (Signed)
Inpatient RehabilitationTeam Conference and Plan of Care Update Date: 11/21/2018   Time: 10:30 AM    Patient Name: Destiny [name]      Medical Record Number: [id number]  Date of Birth: [birth year] Sex: Female         Room/Bed: 4W08C/4W08C-01 Payor Info: Payor: MEDICAID PENDING / Plan: MEDICAID PENDING / Product Type: *No Product type* /    Admitting Diagnosis: cva  Admit Date/Time:  11/09/2018  2:58 PM Admission Comments: No comment available   Primary Diagnosis:  <principal problem not specified> Principal Problem: <principal problem not specified>  Patient Active Problem List   Diagnosis Date Noted  . Acute blood loss anemia   . Chronic systolic congestive heart failure ([place])   . Embolic stroke ([place]) 25/42/7062  . 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 Thrombocytopenia ([place])   . Cerebrovascular accident (CVA) ([place])   . HLD (hyperlipidemia) 11/02/2018  . GERD (gastroesophageal reflux disease) 11/02/2018  . CAD (coronary artery disease) 11/02/2018  . Sleep apnea 11/02/2018  . Hypotension 11/02/2018  . Somnolence 11/02/2018  . Chronic diastolic heart failure ([place]) 11/15/2017  . HTN (hypertension) 11/15/2017  . Hypokalemia 11/05/2017  . Chest pain 02/14/2016  . Small bowel obstruction ([place]) 06/16/2015    Expected Discharge Date: Expected Discharge Date: 11/29/18  Team Members Present: Physician leading conference: Dr. Alysia Penna Social Worker Present: Ovidio Kin, LCSW Nurse Present: Blair Heys, RN PT Present: Barrie Folk, PT;Rosita Dechalus, PTA OT Present: Darleen Crocker, OT SLP Present: Charolett Bumpers, SLP PPS Coordinator present : Gunnar Fusi     Current Status/Progress Goal Weekly Team Focus  Medical   wound healing, no pain , CPAP, Cont B and B, Mod A ADL  Maintain med stability  safety awareness   Bowel/Bladder   Inontinent of bladder and continent of bowel LBM-11/18/18  Regain continence of bladder, maintain regular B/B routine  Toileting every  2 hours and prn, assist with toileting needs   Swallow/Nutrition/ Hydration   intermittent supervision with dysphagia 3 - goal met  Mod I with least restrictive diet - goal met 11/19/18  goal met - pt prefers continuing diet of dysphagia 3   ADL's   Mod A for bathing, Min A UB dressing, Max A LB dressing, Mod A toileting + squat/stand pivot toilet transfers   Supervision-Min A overall   Lt NMR, functional transfers, sitting/standing balance, activity tolerance, cognitive remediation    Mobility   Min assist overall with intermittent Mod assist due to poor attention to the L   minA overall  Family education , d/c planning, L NMR, safety with gait and transfers. balance   Communication             Safety/Cognition/ Behavioral Observations  Min A to supervision with basic cognition  Supervision with basic cognition - downgraded 11/16/18 d/t baseline abilities  basic problem solving, selecitve attention, awareness, memory   Pain   Denies pain   Remain pain free  Assess pain every shift and as needed   Skin   MASD to vagina/perineal area, eccchymosis to abdomen, arms, and legs-all areas improving  promote healing, no new skin issues, maintain skin integrity  Assess skin every shift and prn      *See Care Plan and progress notes for long and short-term goals.     Barriers to Discharge  Current Status/Progress Possible Resolutions Date Resolved   Physician    Medical stability     progressing toward goals  Cont NM re education  Nursing                  PT                    OT                  SLP                SW                Discharge Planning/Teaching Needs:  Husband can provide supervision-min assist level. He will plan to come in prior to DC home. Pt being seen by neuro-psych for coping.      Team Discussion:  Progressing toward her goals of min assist level. Speech diet goal reached wants to stay on Dys 3 diet. Needs added time to complete tasks and answer questions.  Resuming plavix and aspirin. Will need family training prior to [place] home with husband. Wound dressing changes may be decreased to qd and the DC with her wound healing.  Revisions to Treatment Plan:  11/29/2018    Continued Need for Acute Rehabilitation Level of Care: The patient requires daily medical management by a physician with specialized training in physical medicine and rehabilitation for the following conditions: Daily direction of a multidisciplinary physical rehabilitation program to ensure safe treatment while eliciting the highest outcome that is of practical value to the patient.: Yes Daily medical management of patient stability for increased activity during participation in an intensive rehabilitation regime.: Yes Daily analysis of laboratory values and/or radiology reports with any subsequent need for medication adjustment of medical intervention for : Neurological problems;Wound care problems   I attest that I was present, lead the team conference, and concur with the assessment and plan of the team. Teleconference held due to [place], Gardiner Rhyme 11/22/2018, 2:16 PM

## 2018-11-21 NOTE — Plan of Care (Signed)
  Problem: Consults Goal: RH STROKE PATIENT EDUCATION Description See Patient Education module for education specifics  Outcome: Progressing   Problem: RH BOWEL ELIMINATION Goal: RH STG MANAGE BOWEL WITH ASSISTANCE Description STG Manage Bowel with min Assistance.  Outcome: Progressing Flowsheets (Taken 11/21/2018 1457) STG: Pt will manage bowels with assistance: 3-Moderate assistance   Problem: RH BLADDER ELIMINATION Goal: RH STG MANAGE BLADDER WITH ASSISTANCE Description STG Manage Bladder With min Assistance  Outcome: Progressing Flowsheets (Taken 11/21/2018 1457) STG: Pt will manage bladder with assistance: 3-Moderate assistance   Problem: RH SKIN INTEGRITY Goal: RH STG SKIN FREE OF INFECTION/BREAKDOWN Description Patients skin will remain free from further infection or breakdown with mod assist.  Outcome: Progressing Goal: RH STG MAINTAIN SKIN INTEGRITY WITH ASSISTANCE Description STG Maintain Skin Integrity With mod Assistance.  Outcome: Progressing Flowsheets (Taken 11/21/2018 1457) STG: Maintain skin integrity with assistance: 4-Minimal assistance Goal: RH STG ABLE TO PERFORM INCISION/WOUND CARE W/ASSISTANCE Description STG Able To Perform Incision/Wound Care With mod/max Assistance.  Outcome: Progressing Flowsheets (Taken 11/21/2018 1457) STG: Pt will be able to perform incision/wound care with assistance: 4-Minimal assistance   Problem: RH KNOWLEDGE DEFICIT Goal: RH STG INCREASE KNOWLEDGE OF HYPERTENSION Description Patient/caregiver will verbalize understanding of HTN including medications, diet, exercise, monitoring, and follow up care with min assist.  Outcome: Progressing Goal: RH STG INCREASE KNOWLEGDE OF HYPERLIPIDEMIA Description Patient/caregiver will verbalize understanding of HLD including medications, diet, exercise, monitoring, and follow up care with min assist.  Outcome: Progressing Goal: RH STG INCREASE KNOWLEDGE OF STROKE  PROPHYLAXIS Description Patient/caregiver will verbalize understanding of stroke prophylaxis including medications, diet, exercise, monitoring, and follow up care with min assist.  Outcome: Progressing

## 2018-11-21 NOTE — Progress Notes (Signed)
[facility] PHYSICAL MEDICINE & REHABILITATION PROGRESS NOTE   Subjective/Complaints:  No issues overnite, aware of date  Review of systems: Denies CP, SOB, N/V/D  Objective:   No results found. Recent Labs    11/19/18 0529  WBC 6.1  HGB 8.8*  HCT 27.9*  PLT 383   Recent Labs    11/19/18 0529  NA 140  K 4.1  CL 108  CO2 22  GLUCOSE 98  BUN 7*  CREATININE 0.66  CALCIUM 9.5    Intake/Output Summary (Last 24 hours) at 11/21/2018 0815 Last data filed at 11/20/2018 1500 Gross per 24 hour  Intake 320 ml  Output -  Net 320 ml     Physical Exam: Vital Signs Blood pressure 111/80, pulse (!) 103, temperature 98.4 F (36.9 C), temperature source Oral, resp. rate 20, height '5\' 2"'$  (1.575 m), weight 89.4 kg, SpO2 98 %. Constitutional: No distress . Vital signs reviewed. HENT: Normocephalic.  Atraumatic. Eyes: EOMI.  No discharge. Cardiovascular: No JVD. Respiratory: Normal effort. GI: Non-distended. Musc: No edema or tenderness in extremities. Neurologic:alert mild disarthria, increased latency of response Motor: LUE/LLE: Grossly 2/5 proximal to distal, 4/5 RUE/RLE   Assessment/Plan: 1. 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  Physiatrist is providing close team supervision and 24 hour management of active medical problems listed below.  Physiatrist and rehab team continue to assess barriers to discharge/monitor patient progress toward functional and medical goals  Care Tool:  Bathing  Bathing activity did not occur: Refused Body parts bathed by patient: Chest, Abdomen, Right upper leg, Left upper leg, Face, Left arm, Buttocks   Body parts bathed by helper: Right arm, Right lower leg, Left lower leg, Front perineal area, Buttocks Body parts n/a: Front perineal area, Buttocks   Bathing assist Assist Level: Maximal  Assistance - Patient 24 - 49%     Upper Body Dressing/Undressing Upper body dressing   What is the patient wearing?: Pull over shirt    Upper body assist Assist Level: Minimal Assistance - Patient > 75%    Lower Body Dressing/Undressing Lower body dressing      What is the patient wearing?: Pants, Incontinence brief     Lower body assist Assist for lower body dressing: Maximal Assistance - Patient 25 - 49%     Toileting Toileting Toileting Activity did not occur (Clothing management and hygiene only): N/A (no void or bm)  Toileting assist Assist for toileting: Moderate Assistance - Patient 50 - 74%     Transfers Chair/bed transfer  Transfers assist  Chair/bed transfer activity did not occur: Safety/medical concerns  Chair/bed transfer assist level: Moderate Assistance - Patient 50 - 74%     Locomotion Ambulation   Ambulation assist      Assist level: Minimal Assistance - Patient > 75% Assistive device: Walker-rolling(w/ L hand splint) Max distance: 22'   Walk 10 feet activity   Assist     Assist level: Minimal Assistance - Patient > 75% Assistive device: Walker-rolling, Orthosis   Walk 50 feet activity   Assist Walk 50 feet with 2 turns activity did not occur: Safety/medical concerns         Walk 150 feet activity   Assist Walk 150 feet activity did not occur: Safety/medical concerns         Walk 10 feet on uneven surface  activity   Assist Walk 10 feet on uneven  surfaces activity did not occur: Safety/medical concerns         Wheelchair     Assist Will patient use wheelchair at discharge?: Yes Type of Wheelchair: Manual    Wheelchair assist level: Maximal Assistance - Patient 25 - 49% Max wheelchair distance: 20 ft    Wheelchair 50 feet with 2 turns activity    Assist        Assist Level: Minimal Assistance - Patient > 75%   Wheelchair 150 feet activity     Assist Wheelchair 150 feet activity did not  occur: Safety/medical concerns        Medical Problem List and Plan: 1.Functional deficits and left hemiparesissecondary to embolic cva involving right corona radiata, right subcorital parietal lobe, left cerebellum Continue CIR PT, OT, SLP, Team conference today please see physician documentation under team conference tab, met with team face-to-face to discuss problems,progress, and goals. Formulized individual treatment plan based on medical history, underlying problem and comorbidities. 2. Antithrombotics: -DVT/anticoagulation:Mechanical:Sequential compression devices, below kneeBilateral lower extremities -antiplatelet therapy: Low dose ASA and Plavix resumed, hgb 8.8 3. Pain Management:Tylenol prn 4. Mood:LCSW to follow for evaluation and support. -antipsychotic agents: N/A 5. Neuropsych: This patientiscapable of making decisions on herown behalf. 6. Skin/Wound Care:Monitor abdominal wound for recurrant drainage as mobility improves. Continue wet-to-dry dressings until scabs are removed 7. Fluids/Electrolytes/Nutrition:follow I's and O's. BMET nl 5/5 8. H/o Ovarian CA: Chemo on hold. 9. Reactive depression/Anxiety: ON Effexor and Paxil. 10. Hyperactive bladder: Continue Ditropan. 11. Thrombocytopenia:: Resolved  Platelets 269 on 4/28, plt 383 on 5/4 12.  Chronic systolic CHF: Continue daily weights. 1800 cc FR. ON spironolactone and Crestor. Off Demadex and metoprolol at this time.  Filed Weights   11/19/18 0600 11/20/18 0500 11/21/18 0544  Weight: 88.7 kg 90.1 kg 89.4 kg  trending up, ?technique 13. Pancytopenia: Improved, still anemic  Hgb stable at 8.4 on 4/28, improved to 8.8 on 5/4 14. Hypokalemia: Improved with supplementation.    Potassium 4.1on 5/4  LOS: 12 days A FACE TO [place] 11/21/2018, 8:15 AM

## 2018-11-22 ENCOUNTER — Inpatient Hospital Stay (HOSPITAL_COMMUNITY): Payer: Self-pay | Admitting: Speech Pathology

## 2018-11-22 ENCOUNTER — Inpatient Hospital Stay (HOSPITAL_COMMUNITY): Payer: Self-pay | Admitting: Occupational Therapy

## 2018-11-22 ENCOUNTER — Inpatient Hospital Stay (HOSPITAL_COMMUNITY): Payer: Self-pay | Admitting: Physical Therapy

## 2018-11-22 NOTE — Progress Notes (Signed)
Speech Language Pathology Daily Session Note  Patient Details  Name: Destiny [name] MRN: [id number] Date of Birth: 1956/04/24  Today's Date: 11/22/2018 SLP Individual Time: 1005-1100 SLP Individual Time Calculation (min): 55 min  Short Term Goals: Week 2: SLP Short Term Goal 1 (Week 2): Patient will demonstrate functional problem solving for basic and familiar tasks with supervision cues.  SLP Short Term Goal 2 (Week 2): Patient will recall new, daily information with use of external aids with Min A verbal and visual cues.  SLP Short Term Goal 3 (Week 2): Patient will demonstrate selective attention to functional tasks for ~30 minutes with Min A verbal cues for redirection in a mildly distracting enviornment.  SLP Short Term Goal 4 (Week 2): Patient will identify 2 physical and 2 cognitive deficits with supervision verbal cues.   Skilled Therapeutic Interventions: Pt was seen for skilled ST intervention targeting goals for cognition. Pt was cooperative with unfamiliar therapist. SLP facilitated basic problem solving and attention skills by continuing with calendar activity. Pt required mod verbal cues to attend to details needed for proper recall of appointments (specific time). Pt verbalized 2 physical deficits (left hand and walking), but stated only 1 cognitive deficit (memory). Pt reported that at home, she did the shopping, managed her medications, and paid some bills (with cash - no checkbook). Her husband did the cooking and cleaning, and paid some bills. Completion of med management task may be beneficial for functional problem solving. Pt was left in bed with alarm on, all needs within reach. Continue skilled ST intervention per current plan of care.   Pain Pain Assessment Pain Scale: 0-10 Pain Score: 0-No pain  Therapy/Group: Individual Therapy   Monay Houlton B. Quentin Ore, [facility], [place] Speech Language Pathologist   Shonna Chock 11/22/2018, 12:09 PM

## 2018-11-22 NOTE — Progress Notes (Signed)
[facility] PHYSICAL MEDICINE & REHABILITATION PROGRESS NOTE   Subjective/Complaints:  Patient sleeping with CPAP mask on arouses to physical stimulation.  No complaints this morning.  We discussed her discharge date  Review of systems: Denies CP, SOB, N/V/D  Objective:   No results found. No results for input(s): WBC, HGB, HCT, PLT in the last 72 hours. No results for input(s): NA, K, CL, CO2, GLUCOSE, BUN, CREATININE, CALCIUM in the last 72 hours.  Intake/Output Summary (Last 24 hours) at 11/22/2018 0752 Last data filed at 11/21/2018 2043 Gross per 24 hour  Intake 120 ml  Output -  Net 120 ml     Physical Exam: Vital Signs Blood pressure 114/76, pulse 94, temperature 97.6 F (36.4 C), resp. rate 16, height 5\' 2"  (1.575 m), weight 89.5 kg, SpO2 99 %. Constitutional: No distress . Vital signs reviewed. HENT: Normocephalic.  Atraumatic. Eyes: EOMI.  No discharge. Cardiovascular: No JVD. Respiratory: Normal effort. GI: Non-distended. Musc: No edema or tenderness in extremities. Neurologic:alert mild disarthria, increased latency of response Motor: LUE/LLE: Grossly 2/5 proximal to distal, 4/5 RUE/RLE   Assessment/Plan: 1. 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  Physiatrist is providing close team supervision and 24 hour management of active medical problems listed below.  Physiatrist and rehab team continue to assess barriers to discharge/monitor patient progress toward functional and medical goals  Care Tool:  Bathing  Bathing activity did not occur: Refused Body parts bathed by patient: Chest, Abdomen, Right upper leg, Left upper leg, Face, Left arm   Body parts bathed by helper: Front perineal area, Buttocks, Right lower leg, Left lower leg, Right arm Body parts n/a: Front perineal area, Buttocks   Bathing assist Assist  Level: Moderate Assistance - Patient 50 - 74%     Upper Body Dressing/Undressing Upper body dressing   What is the patient wearing?: Pull over shirt    Upper body assist Assist Level: Moderate Assistance - Patient 50 - 74%    Lower Body Dressing/Undressing Lower body dressing      What is the patient wearing?: Pants, Incontinence brief     Lower body assist Assist for lower body dressing: Moderate Assistance - Patient 50 - 74%     Toileting Toileting Toileting Activity did not occur Landscape architect and hygiene only): N/A (no void or bm)  Toileting assist Assist for toileting: Maximal Assistance - Patient 25 - 49%     Transfers Chair/bed transfer  Transfers assist  Chair/bed transfer activity did not occur: Safety/medical concerns  Chair/bed transfer assist level: Moderate Assistance - Patient 50 - 74%     Locomotion Ambulation   Ambulation assist      Assist level: Minimal Assistance - Patient > 75% Assistive device: Walker-rolling(w/ L hand splint) Max distance: 22'   Walk 10 feet activity   Assist     Assist level: Minimal Assistance - Patient > 75% Assistive device: Walker-rolling, Orthosis   Walk 50 feet activity   Assist Walk 50 feet with 2 turns activity did not occur: Safety/medical concerns         Walk 150 feet activity   Assist Walk 150 feet activity did not occur: Safety/medical concerns         Walk 10 feet on uneven surface  activity   Assist Walk 10 feet on uneven surfaces activity did not occur: Safety/medical concerns  Wheelchair     Assist Will patient use wheelchair at discharge?: Yes Type of Wheelchair: Manual    Wheelchair assist level: Maximal Assistance - Patient 25 - 49% Max wheelchair distance: 20 ft    Wheelchair 50 feet with 2 turns activity    Assist        Assist Level: Minimal Assistance - Patient > 75%   Wheelchair 150 feet activity     Assist Wheelchair 150 feet  activity did not occur: Safety/medical concerns        Medical Problem List and Plan: 1.Functional deficits and left hemiparesissecondary to embolic cva involving right corona radiata, right subcorital parietal lobe, left cerebellum Continue CIR PT, OT, SLP, ELOS 5/14 2. Antithrombotics: -DVT/anticoagulation:Mechanical:Sequential compression devices, below kneeBilateral lower extremities -antiplatelet therapy: Low dose ASA and Plavix resumed, hgb 8.8 3. Pain Management:Tylenol prn 4. Mood:LCSW to follow for evaluation and support. -antipsychotic agents: N/A 5. Neuropsych: This patientiscapable of making decisions on herown behalf. 6. Skin/Wound Care:Monitor abdominal wound for recurrant drainage as mobility improves. Continue wet-to-dry dressings until scabs are removed 7. Fluids/Electrolytes/Nutrition:follow I's and O's. BMET nl 5/5 8. H/o Ovarian CA: Chemo on hold. 9. Reactive depression/Anxiety: ON Effexor and Paxil. 10. Hyperactive bladder: Continue Ditropan. 11. Thrombocytopenia:: Resolved  Platelets 269 on 4/28, plt 383 on 5/4 12.  Chronic systolic CHF: Continue daily weights. 1800 cc FR. ON spironolactone and Crestor. Off Demadex and metoprolol at this time.  Filed Weights   11/20/18 0500 11/21/18 0544 11/22/18 0405  Weight: 90.1 kg 89.4 kg 89.5 kg  last 2 reading stable no clinical sign of edema 13. Pancytopenia: Improved, still anemic  Hgb stable at 8.4 on 4/28, improved to 8.8 on 5/4 14. Hypokalemia: Improved with supplementation.    Potassium 4.1on 5/4  LOS: 13 days A FACE TO [place] E Darrell Hauk 11/22/2018, 7:52 AM

## 2018-11-22 NOTE — Progress Notes (Signed)
Nutrition Follow-up  RD working remotely.  DOCUMENTATION CODES:   Obesity unspecified, Severe malnutrition in context of acute illness/injury  INTERVENTION:  - Continue Boost Breeze po TID, each supplement provides 250 kcal and 9 grams of protein  - Continue Magic cup TID with meals, each supplement provides 290 kcal and 9 grams of protein  - Continue MVI daily  - Encourage adequate PO intake  NUTRITION DIAGNOSIS:   Severe Malnutrition related to lethargy/confusion, acute illness (complications stemming from surgery done for her cancer) as evidenced by percent weight loss, energy intake < 75% for > or equal to 1 month.  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 Was admitted to [facility] (4/16-4/24) w/ somnolence and eventual MRI showed several small infarcts. Transferred to CIR for intensive rehab.   Noted pt's discharge date of 5/14.  Weight stable since last RD assessment.  Reviewed RN edema assessment. Pt with non-pitting edema to LUE.  Attempted to speak with pt via phone call to room but no answer on multiple attempts.  Reviewed meal completion records. Percent meal completion has improved from an average of ~40% to an average of 61%. Per MAR, pt accepting most Boost Breeze supplements. Will continue with current supplement regimen.  Meal Completion: 10-100% x last 8 recorded meals (averaging 61%)  Medications reviewed and include: Colace, Pepcid, Boost Breeze TID (pt accepting >90%), Pro-sight  MVI, K-dur 40 mEq BID spironolactone  Labs reviewed: hemoglobin 8.8 (L) on 5/4  Diet Order:   Diet Order            DIET DYS 3 Room service appropriate? Yes; Fluid consistency: Thin; Fluid restriction: 1200 mL Fluid  Diet effective now              EDUCATION NEEDS:   No education needs have been identified at this time  Skin:  Skin Assessment: Skin Integrity Issues: Incisions: abdomen Other: abrasions to BLE, MASD to buttocks/groin  Last BM:  11/18/18  Height:   Ht Readings from Last 1 Encounters:  11/09/18 5\' 2"  (1.575 m)    Weight:   Wt Readings from Last 1 Encounters:  11/22/18 89.5 kg    Ideal Body Weight:  50 kg  BMI:  Body mass index is 36.09 kg/m.  Estimated Nutritional Needs:   Kcal:  1600-1850 (19-21 kcal/kg bw)  Protein:  80-90g Pro (20% energy)  Fluid:  1.6-1.8L    Gaynell Face, MS, RD, LDN Inpatient Clinical Dietitian Pager: [phone] Weekend/After Hours: [phone]

## 2018-11-22 NOTE — Progress Notes (Signed)
Occupational Therapy Session Note  Patient Details  Name: Destiny [name] MRN: [id number] Date of Birth: 1955/12/16  Today's Date: 11/22/2018 OT Individual Time: 0705-0720 and 4098-1191 OT Individual Time Calculation (min): 15 min  And 30 mins  Today's Date: 11/22/2018 OT Missed Time: 39 Minutes Missed Time Reason: Patient fatigue   Short Term Goals: Week 2:  OT Short Term Goal 1 (Week 2): Pt will complete toilet transfer with Min A and LRAD OT Short Term Goal 2 (Week 2): Pt will complete LB dressing with Mod A using hemi dressing techniques  OT Short Term Goal 3 (Week 2): Pt will complete 1 grooming task in standing to improve standing balance/tolerance   Skilled Therapeutic Interventions/Progress Updates:   Session1: Upon entering the room, pt sleeping soundly in bed. OT making multiple attempts to awaken pt but she does not arouse. Pt mumbling to therapist but does not open eyes and returns back to sleep. Pt remaining in bed with bed alarm activated and call bell within reach.    Session 2: Upon entering the room, pt seated in wheelchair with no c/o pain this session and agreeable to OT intervention. Pt's laundry returned from dryer and focus of session on B UE coordination task of folding clothing. Pt needing mod cuing to attend to L UE for functional task. However, pt was unable to tie pants with multiple attempts. Pt declined toileting needs and remained in wheelchair with chair alarm belt donned and call bell within reach.   Therapy Documentation Precautions:  Precautions Precautions: Fall, Other (comment) Precaution Comments: Lt hemi, abdominal wound + Rt chest port  Restrictions Weight Bearing Restrictions: No General: General OT Amount of Missed Time: 45 Minutes Vital Signs: Therapy Vitals Temp: 97.6 F (36.4 C) Pulse Rate: 94 Resp: 16 BP: 114/76 Patient Position (if appropriate): Lying Oxygen Therapy SpO2: 99 % O2 Device: CPAP Pain:   ADL: ADL Grooming:  Supervision/safety Where Assessed-Grooming: Chair Upper Body Bathing: Minimal assistance Where Assessed-Upper Body Bathing: Edge of bed Lower Body Bathing: Maximal assistance Where Assessed-Lower Body Bathing: Edge of bed Upper Body Dressing: Maximal assistance Where Assessed-Upper Body Dressing: Edge of bed Lower Body Dressing: Maximal assistance Where Assessed-Lower Body Dressing: Edge of bed Toileting: Not assessed Toilet Transfer: Not assessed Social research officer, government: Not assessed   Therapy/Group: Individual Therapy  Gypsy Decant 11/22/2018, 7:30 AM

## 2018-11-22 NOTE — Progress Notes (Signed)
Physical Therapy Weekly Progress Note  Patient Details  Name: Destiny [name] MRN: [id number] Date of Birth: 1956-04-28  Beginning of progress report period: Nov 16, 2018 End of progress report period: Nov 22, 2018  Today's Date: 11/22/2018 PT Individual Time: 9163-8466 PT Individual Time Calculation (min): 60 min   Patient has met 4 of 4 short term goals.  Pt is making steady progress towards long term goals. Currently requires min assist intermittient for bed mobility, transfers, and gait with RW. Moderate cues for sequencing, posture, and awareness of the LUE/LLE required at all time for safety with all mobility. Awareness, attention, and motor planning continue to be this pt's greatest limiting factor.   Patient continues to demonstrate the following deficits muscle weakness, muscle joint tightness and muscle paralysis, decreased cardiorespiratoy endurance, abnormal tone, unbalanced muscle activation, motor apraxia, decreased coordination and decreased motor planning, decreased visual perceptual skills, decreased attention to left and decreased sitting balance, decreased standing balance, decreased postural control, hemiplegia and decreased balance strategies and therefore will continue to benefit from skilled PT intervention to increase functional independence with mobility.  Patient progressing toward long term goals..  Continue plan of care.  PT Short Term Goals Week 1:  PT Short Term Goal 1 (Week 1): Pt will perform bed mobility with mod assist  PT Short Term Goal 1 - Progress (Week 1): Met PT Short Term Goal 2 (Week 1): Pt will perfomed WC<>bed transfer with mod assist  PT Short Term Goal 2 - Progress (Week 1): Met PT Short Term Goal 3 (Week 1): Pt eill ambulate 13f with mod assist of 1.  PT Short Term Goal 3 - Progress (Week 1): Met PT Short Term Goal 4 (Week 1): Pt will propell WC 1062fwith min assist  PT Short Term Goal 4 - Progress (Week 1): Met Week 2:  PT Short Term Goal 1  (Week 2): Pt will ambulate 5026fith min assist and LRAD  PT Short Term Goal 1 - Progress (Week 2): Met PT Short Term Goal 2 (Week 2): Pt will propell WC [id number] min assist  PT Short Term Goal 2 - Progress (Week 2): Met PT Short Term Goal 3 (Week 2): Pt will perfomed bed mobilty with min assist PT  PT Short Term Goal 3 - Progress (Week 2): Met PT Short Term Goal 4 (Week 2): Pt will maintain standing balance x 3 minutes with min assist and LRAD  PT Short Term Goal 4 - Progress (Week 2): Met Week 3:  PT Short Term Goal 1 (Week 3): STG=LTG due to ELOS  Skilled Therapeutic Interventions/Progress Updates:   Pt received sitting EOB with NT and reports need for urination. Gait training through room with RW and min assist. Toilet transfer. Gait training with RW, L hand orthotic and min A x 90ft17fes from PT for posture, step width on the LLE, and AD management in turn to keep RW close. Dynamic balance training to perform reciprocal foot steps on 3 inch step with BUE support on rails. Step up x 5 BLE with BUE support on rails. Min assist from PT for mincues for improved glute activation to prevent trendelenburg on the L hip.   WC mobility through hall with min assist and BUE prolusion x 100ft.22fient returned to room and left sitting [facility] reach and all needs met.        Therapy Documentation Precautions:  Precautions Precautions: Fall, Other (comment) Precaution Comments: Lt hemi, abdominal  wound + Rt chest port  Restrictions Weight Bearing Restrictions: No    Pain: Pain Assessment Pain Scale: 0-10 Pain Score: 0-No pain   Therapy/Group: Individual Therapy  [name] 11/22/2018, 12:34 PM

## 2018-11-23 ENCOUNTER — Ambulatory Visit: Payer: Self-pay | Admitting: Family

## 2018-11-23 ENCOUNTER — Inpatient Hospital Stay (HOSPITAL_COMMUNITY): Payer: Self-pay | Admitting: Physical Therapy

## 2018-11-23 ENCOUNTER — Inpatient Hospital Stay (HOSPITAL_COMMUNITY): Payer: Self-pay | Admitting: Speech Pathology

## 2018-11-23 ENCOUNTER — Inpatient Hospital Stay (HOSPITAL_COMMUNITY): Payer: Self-pay | Admitting: Occupational Therapy

## 2018-11-23 MED ORDER — BOOST / RESOURCE BREEZE PO LIQD CUSTOM
1.0000 | Freq: Every day | ORAL | Status: DC
  Administered 2018-11-24 – 2018-11-28 (×4): 1 via ORAL

## 2018-11-23 NOTE — Progress Notes (Signed)
Physical Therapy Session Note  Patient Details  Name: Destiny [name] MRN: [id number] Date of Birth: 05-04-1956  Today's Date: 11/23/2018 PT Individual Time: 1130-1230 PT Individual Time Calculation (min): 60 min   Short Term Goals: Week 3:  PT Short Term Goal 1 (Week 3): STG=LTG due to ELOS  Skilled Therapeutic Interventions/Progress Updates:    Pt received seated in w/c in room, agreeable to PT session. No complaints of pain. Dependent transport via w/c to therapy gym for time conservation. Stand pivot transfer w/c to mat table with min A and RW. Static standing balance with RW and CGA for balance while performing horse-shoe toss with RUE, progress to standing with no UE support and min A. Pt has increase in LLE stability with decrease in UE support and with onset of fatigue. Seated balance reaching across midline and outside BOS for targets with alt UE, v/c for safety and for maintaining balance. Ambulation 2 x 70 ft with RW with L hand splint and with min A, pt exhibits decreased LLE clearance and step length and requires assist for RW management. Pt left seated in w/c in room with needs in reach, quick release belt and chair alarm in place at end of session.  Therapy Documentation Precautions:  Precautions Precautions: Fall, Other (comment) Precaution Comments: Lt hemi, abdominal wound + Rt chest port  Restrictions Weight Bearing Restrictions: No    Therapy/Group: Individual Therapy   Excell Seltzer, PT, DPT  11/23/2018, 3:57 PM

## 2018-11-23 NOTE — Progress Notes (Signed)
Speech Language Pathology Weekly Progress and Session Note  Patient Details  Name: Destiny [name] MRN: [id number] Date of Birth: 11/17/1955  Beginning of progress report period: Nov 16, 2018 End of progress report period: Nov 23, 2018  Today's Date: 11/23/2018 SLP Individual Time: 6948-5462 SLP Individual Time Calculation (min): 30 min  Short Term Goals: Week 2: SLP Short Term Goal 1 (Week 2): Patient will demonstrate functional problem solving for basic and familiar tasks with supervision cues.  SLP Short Term Goal 1 - Progress (Week 2): Met SLP Short Term Goal 2 (Week 2): Patient will recall new, daily information with use of external aids with Min A verbal and visual cues.  SLP Short Term Goal 2 - Progress (Week 2): Met SLP Short Term Goal 3 (Week 2): Patient will demonstrate selective attention to functional tasks for ~30 minutes with Min A verbal cues for redirection in a mildly distracting enviornment.  SLP Short Term Goal 3 - Progress (Week 2): Met SLP Short Term Goal 4 (Week 2): Patient will identify 2 physical and 2 cognitive deficits with supervision verbal cues.  SLP Short Term Goal 4 - Progress (Week 2): Met    New Short Term Goals: Week 3: SLP Short Term Goal 1 (Week 3): Patient will demonstrate functional problem solving for basic medication management with supervision cues.  SLP Short Term Goal 2 (Week 3): Patient will demonstrate selective attention to functional tasks for ~30 minutes with supervision cues for redirection in a mildly distracting enviornment.  SLP Short Term Goal 3 (Week 3): Pt will solve basic hypothetical problem solving situations targeting home environment with supervision cues.   Weekly Progress Updates: Pt continues to make good progress and as a result she has met 4 of 4 STGs. Pt is approaching goal level but would benefit from additional cognitive tasks such as medication management to ensure safe discharge plan. At this time, don't anticipate that pt  will require follow up ST services.      Intensity: Minumum of 1-2 x/day, 30 to 90 minutes Frequency: 3 to 5 out of 7 days Duration/Length of Stay: 5/14 Treatment/Interventions: Cognitive remediation/compensation;Environmental controls;Internal/external aids;Therapeutic Activities;Patient/family education;Functional tasks;Cueing hierarchy   Daily Session  Skilled Therapeutic Interventions: Skilled treatment session focused on cognition goals. SLP facilitated session by providing supervision support for bed mobility, tray set-up for breakfast as well as selective attention to task for ~30 minutes in moderately distracting environment. Pt has also completed word search and more copies given to pt. Pt was left upright in bed, bed alarm on and all needs within reach. Continue per current plan of care.     General    Pain Pain Assessment Pain Scale: 0-10 Pain Score: 0-No pain  Therapy/Group: Individual Therapy  Destiny [name] 11/23/2018, 8:43 AM

## 2018-11-23 NOTE — Progress Notes (Signed)
Occupational Therapy Session Note  Patient Details  Name: Destiny [name] MRN: [id number] Date of Birth: 1956/02/27  Today's Date: 11/23/2018 OT Individual Time: 5456-2563 OT Individual Time Calculation (min): 57 min   Short Term Goals: Week 2:  OT Short Term Goal 1 (Week 2): Pt will complete toilet transfer with Min A and LRAD OT Short Term Goal 2 (Week 2): Pt will complete LB dressing with Mod A using hemi dressing techniques  OT Short Term Goal 3 (Week 2): Pt will complete 1 grooming task in standing to improve standing balance/tolerance   Skilled Therapeutic Interventions/Progress Updates:    Pt greeted in w/c, verbalizing that she had just urinated and brief was wet. Ambulatory transfer completed to toilet, using RW with Min A for ambulation and Mod A for sit<stand. Vcs required for safely crossing bathroom threshold, and for staying inside of device. Pt with continent bladder void while sitting on elevated toilet. Due to soiled pants, she doffed them with supervision. Increased time spent for pt to thread LEs into new pants, needing assist to get her started at feet. Min A for balance while completing hygiene in standing with unilateral support on grab bar. Mod A for dynamic balance without UE support while managing clothing. OT facilitated Lt hand grip for elevating clothing on affected side. She then ambulated to chair placed at sink. Min A for sit<stand to wash hands. Pt able to manage levers using L UE with vcs. When she returned to w/c, continued L UE NMR via foam sponge squeezes and exercises using theraputty, focus placed on grasp/release, pinching, and pulling. Decreased functional use of Lt hand continues to limit her. At end of session pt was left with all needs within reach and safety belt fastened.    Therapy Documentation Precautions:  Precautions Precautions: Fall, Other (comment) Precaution Comments: Lt hemi, abdominal wound + Rt chest port  Restrictions Weight Bearing  Restrictions: No Vital Signs: Therapy Vitals Temp: 99.2 F (37.3 C) Temp Source: Oral Pulse Rate: (error) Resp: 20 BP: 109/82 Patient Position (if appropriate): Sitting Oxygen Therapy SpO2: 98 % O2 Device: Room Air Pain: No c/o pain during tx    ADL: ADL Grooming: Supervision/safety Where Assessed-Grooming: Chair Upper Body Bathing: Minimal assistance Where Assessed-Upper Body Bathing: Edge of bed Lower Body Bathing: Maximal assistance Where Assessed-Lower Body Bathing: Edge of bed Upper Body Dressing: Maximal assistance Where Assessed-Upper Body Dressing: Edge of bed Lower Body Dressing: Maximal assistance Where Assessed-Lower Body Dressing: Edge of bed Toileting: Not assessed Toilet Transfer: Not assessed Social research officer, government: Not assessed     Therapy/Group: Individual Therapy  Keishon Chavarin A Niang [place] 11/23/2018, 4:29 PM

## 2018-11-23 NOTE — Progress Notes (Signed)
Physical Therapy Session Note  Patient Details  Name: Destiny [name] MRN: [id number] Date of Birth: Jun 15, 1956  Today's Date: 11/23/2018 PT Individual Time: 0900-1015 PT Individual Time Calculation (min): 75 min   Short Term Goals: Week 3:  PT Short Term Goal 1 (Week 3): STG=LTG due to ELOS  Skilled Therapeutic Interventions/Progress Updates:      Pt received supine in bed and agreeable to PT. Supine>sit transfer with supervision assist  From semirecumbent position and increased time; min cues for technique and improved pelvic positioning.   Ambulatory transfer to toilet with RW and min assist. Upper and lower body dressing at toilet with set up assist for upper body and mod assist on the L for lower body to thread Bil feet into pants. Peri care performed with CGA-Supervision assist from PT for safety.  Pt transported to rehab gym in [facility]. PT instructed pt in dynamic balance training. Laterl reach R and L x 8 each followed by bean bag toss to target. Min assist from PT with min-mod cues for weight shifting R and L and improved upright posture to improve core control. Variable gait training in parallel bars forward/backward and side stepping 2x 10 ft each direction. Cues for improved step length, glute activation on the L and improved posture.  Sit<>stand from [facility] at hemi height 2 x 5 with min assist overall from PT. Cues for LE placement, to push from [facility] and full erect posture in stance. Patient returned to room and left sitting in [facility] with call bell in reach and all needs met.     Therapy Documentation Precautions:  Precautions Precautions: Fall, Other (comment) Precaution Comments: Lt hemi, abdominal wound + Rt chest port  Restrictions Weight Bearing Restrictions: No Pain: Pain Assessment Pain Scale: 0-10 Pain Score: 0-No pain    Therapy/Group: Individual Therapy  [name] 11/23/2018, 10:22 AM

## 2018-11-23 NOTE — Progress Notes (Signed)
[place] PHYSICAL MEDICINE & REHABILITATION PROGRESS NOTE   Subjective/Complaints:  No issues overnite working with SLP, intake poor 10-50%  Review of systems: Denies CP, SOB, N/V/D  Objective:   No results found. No results for input(s): WBC, HGB, HCT, PLT in the last 72 hours. No results for input(s): NA, K, CL, CO2, GLUCOSE, BUN, CREATININE, CALCIUM in the last 72 hours.  Intake/Output Summary (Last 24 hours) at 11/23/2018 0929 Last data filed at 11/22/2018 1820 Gross per 24 hour  Intake 444 ml  Output -  Net 444 ml     Physical Exam: Vital Signs Blood pressure 105/83, pulse 97, temperature 97.7 F (36.5 C), resp. rate 16, height 5\' 2"  (1.575 m), weight 89.1 kg, SpO2 98 %. Constitutional: No distress . Vital signs reviewed. HENT: Normocephalic.  Atraumatic. Eyes: EOMI.  No discharge. Cardiovascular: No JVD. Respiratory: Normal effort. GI: Non-distended. Musc: No edema or tenderness in extremities. Neurologic:alert mild disarthria, increased latency of response Motor: LUE/LLE: Grossly 2/5 proximal to distal, 4/5 RUE/RLE   Assessment/Plan: 1. 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  Physiatrist is providing close team supervision and 24 hour management of active medical problems listed below.  Physiatrist and rehab team continue to assess barriers to discharge/monitor patient progress toward functional and medical goals  Care Tool:  Bathing  Bathing activity did not occur: Refused Body parts bathed by patient: Chest, Abdomen, Right upper leg, Left upper leg, Face, Left arm   Body parts bathed by helper: Front perineal area, Buttocks, Right lower leg, Left lower leg, Right arm Body parts n/a: Front perineal area, Buttocks   Bathing assist Assist Level: Moderate Assistance - Patient 50 - 74%     Upper Body  Dressing/Undressing Upper body dressing   What is the patient wearing?: Pull over shirt    Upper body assist Assist Level: Moderate Assistance - Patient 50 - 74%    Lower Body Dressing/Undressing Lower body dressing      What is the patient wearing?: Pants, Incontinence brief     Lower body assist Assist for lower body dressing: Moderate Assistance - Patient 50 - 74%     Toileting Toileting Toileting Activity did not occur Landscape architect and hygiene only): N/A (no void or bm)  Toileting assist Assist for toileting: Maximal Assistance - Patient 25 - 49%     Transfers Chair/bed transfer  Transfers assist  Chair/bed transfer activity did not occur: Safety/medical concerns  Chair/bed transfer assist level: Minimal Assistance - Patient > 75%     Locomotion Ambulation   Ambulation assist      Assist level: Minimal Assistance - Patient > 75% Assistive device: Walker-rolling Max distance: 90   Walk 10 feet activity   Assist     Assist level: Minimal Assistance - Patient > 75% Assistive device: Walker-rolling   Walk 50 feet activity   Assist Walk 50 feet with 2 turns activity did not occur: Safety/medical concerns  Assist level: Minimal Assistance - Patient > 75% Assistive device: Walker-rolling    Walk 150 feet activity   Assist Walk 150 feet activity did not occur: Safety/medical concerns         Walk 10 feet on uneven surface  activity   Assist Walk 10 feet on uneven surfaces activity did not occur: Safety/medical concerns         Wheelchair     Assist Will patient  use wheelchair at discharge?: Yes Type of Wheelchair: Manual    Wheelchair assist level: Minimal Assistance - Patient > 75% Max wheelchair distance: 187ft    Wheelchair 50 feet with 2 turns activity    Assist        Assist Level: Minimal Assistance - Patient > 75%   Wheelchair 150 feet activity     Assist Wheelchair 150 feet activity did not  occur: Safety/medical concerns        Medical Problem List and Plan: 1.Functional deficits and left hemiparesissecondary to embolic cva involving right corona radiata, right subcorital parietal lobe, left cerebellum Continue CIR PT, OT, SLP, ELOS 5/14 2. Antithrombotics: -DVT/anticoagulation:Mechanical:Sequential compression devices, below kneeBilateral lower extremities -antiplatelet therapy: Low dose ASA and Plavix resumed, hgb 8.8 3. Pain Management:Tylenol prn 4. Mood:LCSW to follow for evaluation and support. -antipsychotic agents: N/A 5. Neuropsych: This patientiscapable of making decisions on herown behalf. 6. Skin/Wound Care:Monitor abdominal wound for recurrant drainage as mobility improves. Continue wet-to-dry dressings until scabs are removed 7. Fluids/Electrolytes/Nutrition:follow I's and O's. BMET nl 5/5 8. H/o Ovarian CA: Chemo on hold. 9. Reactive depression/Anxiety: ON Effexor and Paxil. 10. Hyperactive bladder: Continue Ditropan. 11. Thrombocytopenia:: Resolved  Platelets 269 on 4/28, plt 383 on 5/4 12.  Chronic systolic CHF: Continue daily weights. 1800 cc FR. ON spironolactone and Crestor. Off Demadex and metoprolol at this time.  Filed Weights   11/21/18 0544 11/22/18 0405 11/23/18 0429  Weight: 89.4 kg 89.5 kg 89.1 kg  last 3 reading stable no clinical sign of edema 13. Pancytopenia: Improved, still anemic Hx ovarian ca with chemo  Hgb stable at 8.4 on 4/28, improved to 8.8 on 5/4 14. Hypokalemia: Improved with supplementation.    Potassium 4.1on 5/4  LOS: 14 days A FACE TO [place] E Kirsteins 11/23/2018, 9:29 AM

## 2018-11-24 ENCOUNTER — Inpatient Hospital Stay (HOSPITAL_COMMUNITY): Payer: Self-pay | Admitting: Occupational Therapy

## 2018-11-24 NOTE — Progress Notes (Signed)
Occupational Therapy Session Note  Patient Details  Name: Destiny [name] MRN: [id number] Date of Birth: January 07, 1956  Today's Date: 11/24/2018 OT Individual Time: 4166-0630 OT Individual Time Calculation (min): 87 min   Short Term Goals: Week 2:  OT Short Term Goal 1 (Week 2): Pt will complete toilet transfer with Min A and LRAD OT Short Term Goal 2 (Week 2): Pt will complete LB dressing with Mod A using hemi dressing techniques  OT Short Term Goal 3 (Week 2): Pt will complete 1 grooming task in standing to improve standing balance/tolerance     Skilled Therapeutic Interventions/Progress Updates:    Pt greeted in w/c with no c/o pain. She declined bathing, but agreeable to try toileting. Mod A for sit<stand using RW, and Min A for ambulatory transfer into bathroom. Vcs required for staying inside of device due to tendency to push it forward. Given a few minutes, pt reported being unable to void. She completed perihygiene in standing with Min-Mod A for dynamic balance (due to soiled brief). Vcs required for thoroughness. Once finished, pt reported that she did have to void. Upon sitting, pt continent of B+B. She stood once again, needing similar assist for perihygiene/clothing mgt. Lt hand able to grip pants for lifting them over hips today! She did need Min A in the back on Lt side. Pt then ambulated to sink to wash hands. Verbal and tactile cues for upright posture at this time. When sitting in w/c, continued working on functional grasp/release for Lt hand using peg puzzle. She needed active assist from Rt UE or OT 90% of the time. Encouraged her to use gross grasp to place pegs, however she was insistent to use Rt to manipulate pegs in Lt hand. Able to pull pegs out of pegboard using Lt without physical assist. Moderate dropping of pegs noted. At end of session pt was left in w/c with all needs within reach and safety belt fastened. Tx focus placed on standing balance, standing tolerance, Lt NMR, and  ADL retraining.   Therapy Documentation Precautions:  Precautions Precautions: Fall, Other (comment) Precaution Comments: Lt hemi, abdominal wound + Rt chest port  Restrictions Weight Bearing Restrictions: No Vital Signs: Therapy Vitals Temp: 99.2 F (37.3 C) Temp Source: Oral Pulse Rate: (!) 106 Resp: 19 BP: 108/80 Patient Position (if appropriate): Sitting Oxygen Therapy SpO2: 99 % O2 Device: Room Air ADL: ADL Grooming: Supervision/safety Where Assessed-Grooming: Chair Upper Body Bathing: Minimal assistance Where Assessed-Upper Body Bathing: Edge of bed Lower Body Bathing: Maximal assistance Where Assessed-Lower Body Bathing: Edge of bed Upper Body Dressing: Maximal assistance Where Assessed-Upper Body Dressing: Edge of bed Lower Body Dressing: Maximal assistance Where Assessed-Lower Body Dressing: Edge of bed Toileting: Not assessed Toilet Transfer: Not assessed Social research officer, government: Not assessed     Therapy/Group: Individual Therapy  Endrit Gittins A Marchelle Rinella 11/24/2018, 4:41 PM

## 2018-11-24 NOTE — Plan of Care (Signed)
  Problem: Consults Goal: RH STROKE PATIENT EDUCATION Description See Patient Education module for education specifics  Outcome: Progressing   Problem: RH BOWEL ELIMINATION Goal: RH STG MANAGE BOWEL WITH ASSISTANCE Description STG Manage Bowel with min Assistance.  Outcome: Progressing   Problem: RH BLADDER ELIMINATION Goal: RH STG MANAGE BLADDER WITH ASSISTANCE Description STG Manage Bladder With min Assistance  Outcome: Progressing   Problem: RH SKIN INTEGRITY Goal: RH STG SKIN FREE OF INFECTION/BREAKDOWN Description Patients skin will remain free from further infection or breakdown with mod assist.  Outcome: Progressing Goal: RH STG MAINTAIN SKIN INTEGRITY WITH ASSISTANCE Description STG Maintain Skin Integrity With mod Assistance.  Outcome: Progressing Goal: RH STG ABLE TO PERFORM INCISION/WOUND CARE W/ASSISTANCE Description STG Able To Perform Incision/Wound Care With mod/max Assistance.  Outcome: Progressing   Problem: RH KNOWLEDGE DEFICIT Goal: RH STG INCREASE KNOWLEDGE OF HYPERTENSION Description Patient/caregiver will verbalize understanding of HTN including medications, diet, exercise, monitoring, and follow up care with min assist.  Outcome: Progressing Goal: RH STG INCREASE KNOWLEGDE OF HYPERLIPIDEMIA Description Patient/caregiver will verbalize understanding of HLD including medications, diet, exercise, monitoring, and follow up care with min assist.  Outcome: Progressing Goal: RH STG INCREASE KNOWLEDGE OF STROKE PROPHYLAXIS Description Patient/caregiver will verbalize understanding of stroke prophylaxis including medications, diet, exercise, monitoring, and follow up care with min assist.  Outcome: Progressing

## 2018-11-24 NOTE — Progress Notes (Signed)
[place] PHYSICAL MEDICINE & REHABILITATION PROGRESS NOTE   Subjective/Complaints:  No new problems this morning. Had a reasonable night. Ate a little better yesterday ROS: Limited due to cognitive/behavioral    Objective:   No results found. No results for input(s): WBC, HGB, HCT, PLT in the last 72 hours. No results for input(s): NA, K, CL, CO2, GLUCOSE, BUN, CREATININE, CALCIUM in the last 72 hours.  Intake/Output Summary (Last 24 hours) at 11/24/2018 1002 Last data filed at 11/24/2018 0900 Gross per 24 hour  Intake 595 ml  Output -  Net 595 ml     Physical Exam: Vital Signs Blood pressure 115/80, pulse 100, temperature 98.7 F (37.1 C), temperature source Oral, resp. rate 16, height 5\' 2"  (1.575 m), weight 89.2 kg, SpO2 99 %. Constitutional: No distress . Vital signs reviewed. HEENT: EOMI, oral membranes moist Neck: supple Cardiovascular: RRR without murmur. No JVD    Respiratory: CTA Bilaterally without wheezes or rales. Normal effort    GI: BS +, non-tender, non-distended  Musc: No edema or tenderness in extremities. Neurologic:alert mild disarthria, increased latency of response Motor: LUE/LLE: Grossly 2/5 proximal to distal, 4/5 RUE/RLE Skin: abd wound   Assessment/Plan: 1. 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  Physiatrist is providing close team supervision and 24 hour management of active medical problems listed below.  Physiatrist and rehab team continue to assess barriers to discharge/monitor patient progress toward functional and medical goals  Care Tool:  Bathing  Bathing activity did not occur: Refused Body parts bathed by patient: Chest, Abdomen, Right upper leg, Left upper leg, Face, Left arm   Body parts bathed by helper: Front perineal area, Buttocks, Right lower leg, Left lower leg, Right arm Body  parts n/a: Front perineal area, Buttocks   Bathing assist Assist Level: Moderate Assistance - Patient 50 - 74%     Upper Body Dressing/Undressing Upper body dressing   What is the patient wearing?: Pull over shirt    Upper body assist Assist Level: Minimal Assistance - Patient > 75%    Lower Body Dressing/Undressing Lower body dressing      What is the patient wearing?: Pants, Incontinence brief     Lower body assist Assist for lower body dressing: Moderate Assistance - Patient 50 - 74%     Toileting Toileting Toileting Activity did not occur (Clothing management and hygiene only): N/A (no void or bm)  Toileting assist Assist for toileting: Moderate Assistance - Patient 50 - 74%     Transfers Chair/bed transfer  Transfers assist  Chair/bed transfer activity did not occur: Safety/medical concerns  Chair/bed transfer assist level: Minimal Assistance - Patient > 75%     Locomotion Ambulation   Ambulation assist      Assist level: Minimal Assistance - Patient > 75% Assistive device: Walker-rolling Max distance: 70'   Walk 10 feet activity   Assist     Assist level: Minimal Assistance - Patient > 75% Assistive device: Walker-rolling   Walk 50 feet activity   Assist Walk 50 feet with 2 turns activity did not occur: Safety/medical concerns  Assist level: Minimal Assistance - Patient > 75% Assistive device: Walker-rolling    Walk 150 feet activity   Assist Walk 150 feet activity did not occur: Safety/medical concerns         Walk 10 feet on uneven surface  activity   Assist Walk 10  feet on uneven surfaces activity did not occur: Safety/medical concerns         Wheelchair     Assist Will patient use wheelchair at discharge?: Yes Type of Wheelchair: Manual    Wheelchair assist level: Minimal Assistance - Patient > 75% Max wheelchair distance: 150ft    Wheelchair 50 feet with 2 turns activity    Assist        Assist  Level: Minimal Assistance - Patient > 75%   Wheelchair 150 feet activity     Assist Wheelchair 150 feet activity did not occur: Safety/medical concerns        Medical Problem List and Plan: 1.Functional deficits and left hemiparesissecondary to embolic cva involving right corona radiata, right subcorital parietal lobe, left cerebellum Continue CIR PT, OT, SLP, ELOS 5/14  2. Antithrombotics: -DVT/anticoagulation:Mechanical:Sequential compression devices, below kneeBilateral lower extremities -antiplatelet therapy: Low dose ASA and Plavix resumed, hgb 8.8 3. Pain Management:Tylenol prn 4. Mood:LCSW to follow for evaluation and support. -antipsychotic agents: N/A 5. Neuropsych: This patientiscapable of making decisions on herown behalf. 6. Skin/Wound Care:local care to abd 7. Fluids/Electrolytes/Nutrition:follow I's and O's. BMET nl 5/5  -intake has been poor, perhaps a little beter yesterday  -continue to push PO  -dietician following 8. H/o Ovarian CA: Chemo on hold. 9. Reactive depression/Anxiety: ON Effexor and Paxil. 10. Hyperactive bladder: Continue Ditropan. 11. Thrombocytopenia:: Resolved  Platelets 269 on 4/28, plt 383 on 5/4 12.  Chronic systolic CHF: Continue daily weights. 1800 cc FR. ON spironolactone and Crestor. Off Demadex and metoprolol at this time.  Filed Weights   11/22/18 0405 11/23/18 0429 11/24/18 0508  Weight: 89.5 kg 89.1 kg 89.2 kg  remains stable no clinical sign of edema 13. Pancytopenia: Improved, still anemic Hx ovarian ca with chemo  Hgb stable at 8.4 on 4/28, improved to 8.8 on 5/4 14. Hypokalemia: Improved with supplementation.    Potassium 4.1on 5/4  LOS: 15 days A FACE TO [place] 11/24/2018, 10:02 AM

## 2018-11-25 ENCOUNTER — Inpatient Hospital Stay (HOSPITAL_COMMUNITY): Payer: Self-pay

## 2018-11-25 NOTE — Progress Notes (Signed)
Occupational Therapy Session Note  Patient Details  Name: Destiny [name] MRN: [id number] Date of Birth: 28-Dec-1955  Today's Date: 11/25/2018 OT Individual Time: 1430-1530 OT Individual Time Calculation (min): 60 min    Short Term Goals: Week 2:  OT Short Term Goal 1 (Week 2): Pt will complete toilet transfer with Min A and LRAD OT Short Term Goal 2 (Week 2): Pt will complete LB dressing with Mod A using hemi dressing techniques  OT Short Term Goal 3 (Week 2): Pt will complete 1 grooming task in standing to improve standing balance/tolerance   Skilled Therapeutic Interventions/Progress Updates:    Session focused on dressing and L UE NMR. Pt received supine agreeable to therapy. Pt declined bathing but wanted to don shirt and pants. Pt required mod A to don pants, unable to reach distally enough to thread either pant leg. Pt able to pull pants up in standing with min balance support. Pt required cueing for hemi technique to don shirt, but was able to do so with (S). Pt completed SPT with min A to w/c. Pt was transported to therapy gym. Pt used RW to complete 15 ft of functional mobility to mat with CGA. Pt completed activity with distal reaching component to carryover into LB dressing. Pt's LUE was placed into the mobile arm support as she bimanually assembled pipe tree. The weights were frequently changed to reflect just-right challenge. Pt then transitioned to standing facing mat and attempted to transition into quadroped on mat, but was unable to support body weight. Pt instead transitioned into prone and was guided through/provided min A to bilaterally weight bear through extended arms. Pt required max A to transition back to seated on mat. Pt was returned to room and left sitting up with all needs met, chair alarm belt fastened.   Therapy Documentation Precautions:  Precautions Precautions: Fall, Other (comment) Precaution Comments: Lt hemi, abdominal wound + Rt chest port   Restrictions Weight Bearing Restrictions: No   Therapy/Group: Individual Therapy  Curtis Sites 11/25/2018, 7:09 AM

## 2018-11-25 NOTE — Progress Notes (Signed)
[place] PHYSICAL MEDICINE & REHABILITATION PROGRESS NOTE   Subjective/Complaints:  Pt without complaints. Sleeping soundly when I arrived  ROS: no SOB, CP, Fever, chills, NVD   Objective:   No results found. No results for input(s): WBC, HGB, HCT, PLT in the last 72 hours. No results for input(s): NA, K, CL, CO2, GLUCOSE, BUN, CREATININE, CALCIUM in the last 72 hours.  Intake/Output Summary (Last 24 hours) at 11/25/2018 1002 Last data filed at 11/24/2018 2050 Gross per 24 hour  Intake 360 ml  Output -  Net 360 ml     Physical Exam: Vital Signs Blood pressure 115/80, pulse (!) 103, temperature 98 F (36.7 C), temperature source Axillary, resp. rate 19, height 5\' 2"  (1.575 m), weight 88.4 kg, SpO2 98 %. Constitutional: No distress . Vital signs reviewed. HEENT: EOMI, oral membranes moist Neck: supple Cardiovascular: RRR without murmur. No JVD    Respiratory: CTA Bilaterally without wheezes or rales. Normal effort , CPAP GI: BS +, non-tender, non-distended  Musc: No edema or tenderness in extremities. Neurologic:alert mild disarthria, increased latency of response Motor: LUE/LLE: Grossly 2/5 proximal to distal, 4/5 RUE/RLE Skin: abd wound   Assessment/Plan: 1. 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  Physiatrist is providing close team supervision and 24 hour management of active medical problems listed below.  Physiatrist and rehab team continue to assess barriers to discharge/monitor patient progress toward functional and medical goals  Care Tool:  Bathing  Bathing activity did not occur: Refused Body parts bathed by patient: Chest, Abdomen, Right upper leg, Left upper leg, Face, Left arm   Body parts bathed by helper: Front perineal area, Buttocks, Right lower leg, Left lower leg, Right arm Body parts n/a: Front  perineal area, Buttocks   Bathing assist Assist Level: Moderate Assistance - Patient 50 - 74%     Upper Body Dressing/Undressing Upper body dressing   What is the patient wearing?: Pull over shirt    Upper body assist Assist Level: Minimal Assistance - Patient > 75%    Lower Body Dressing/Undressing Lower body dressing      What is the patient wearing?: Pants, Incontinence brief     Lower body assist Assist for lower body dressing: Moderate Assistance - Patient 50 - 74%     Toileting Toileting Toileting Activity did not occur (Clothing management and hygiene only): N/A (no void or bm)  Toileting assist Assist for toileting: Moderate Assistance - Patient 50 - 74%     Transfers Chair/bed transfer  Transfers assist  Chair/bed transfer activity did not occur: Safety/medical concerns  Chair/bed transfer assist level: Minimal Assistance - Patient > 75%     Locomotion Ambulation   Ambulation assist      Assist level: Minimal Assistance - Patient > 75% Assistive device: Walker-rolling Max distance: 70'   Walk 10 feet activity   Assist     Assist level: Minimal Assistance - Patient > 75% Assistive device: Walker-rolling   Walk 50 feet activity   Assist Walk 50 feet with 2 turns activity did not occur: Safety/medical concerns  Assist level: Minimal Assistance - Patient > 75% Assistive device: Walker-rolling    Walk 150 feet activity   Assist Walk 150 feet activity did not occur: Safety/medical concerns         Walk 10 feet on uneven surface  activity   Assist Walk 10 feet on uneven surfaces  activity did not occur: Safety/medical concerns         Wheelchair     Assist Will patient use wheelchair at discharge?: Yes Type of Wheelchair: Manual    Wheelchair assist level: Minimal Assistance - Patient > 75% Max wheelchair distance: 162ft    Wheelchair 50 feet with 2 turns activity    Assist        Assist Level: Minimal  Assistance - Patient > 75%   Wheelchair 150 feet activity     Assist Wheelchair 150 feet activity did not occur: Safety/medical concerns        Medical Problem List and Plan: 1.Functional deficits and left hemiparesissecondary to embolic cva involving right corona radiata, right subcorital parietal lobe, left cerebellum Continue CIR PT, OT, SLP, ELOS 5/14  2. Antithrombotics: -DVT/anticoagulation:Mechanical:Sequential compression devices, below kneeBilateral lower extremities -antiplatelet therapy: Low dose ASA and Plavix resumed, hgb 8.8 3. Pain Management:Tylenol prn 4. Mood:LCSW to follow for evaluation and support. -antipsychotic agents: N/A 5. Neuropsych: This patientiscapable of making decisions on herown behalf. 6. Skin/Wound Care:local care to abd 7. Fluids/Electrolytes/Nutrition:follow I's and O's. BMET nl 5/5  -intake  a little beter yesterday  -continue to push PO  -dietician following 8. H/o Ovarian CA: Chemo on hold. 9. Reactive depression/Anxiety: ON Effexor and Paxil. 10. Hyperactive bladder: Continue Ditropan. 11. Thrombocytopenia:: Resolved  Platelets 269 on 4/28, plt 383 on 5/4 12.  Chronic systolic CHF: Continue daily weights. 1800 cc FR. ON spironolactone and Crestor. Off Demadex and metoprolol at this time.  Filed Weights   11/23/18 0429 11/24/18 0508 11/25/18 0500  Weight: 89.1 kg 89.2 kg 88.4 kg  remains stable 5/10 no clinical sign of edema 13. Pancytopenia: Improved, still anemic Hx ovarian ca with chemo  Hgb stable at 8.4 on 4/28, improved to 8.8 on 5/4 14. Hypokalemia: Improved with supplementation.    Potassium 4.1on 5/4  LOS: 16 days A FACE TO [place] 11/25/2018, 10:02 AM

## 2018-11-25 NOTE — Plan of Care (Signed)
  Problem: Consults Goal: RH STROKE PATIENT EDUCATION Description See Patient Education module for education specifics  Outcome: Progressing   Problem: RH BOWEL ELIMINATION Goal: RH STG MANAGE BOWEL WITH ASSISTANCE Description STG Manage Bowel with min Assistance.  Outcome: Progressing   Problem: RH BLADDER ELIMINATION Goal: RH STG MANAGE BLADDER WITH ASSISTANCE Description STG Manage Bladder With min Assistance  Outcome: Progressing   Problem: RH SKIN INTEGRITY Goal: RH STG SKIN FREE OF INFECTION/BREAKDOWN Description Patients skin will remain free from further infection or breakdown with mod assist.  Outcome: Progressing Goal: RH STG MAINTAIN SKIN INTEGRITY WITH ASSISTANCE Description STG Maintain Skin Integrity With mod Assistance.  Outcome: Progressing Goal: RH STG ABLE TO PERFORM INCISION/WOUND CARE W/ASSISTANCE Description STG Able To Perform Incision/Wound Care With mod/max Assistance.  Outcome: Progressing   Problem: RH KNOWLEDGE DEFICIT Goal: RH STG INCREASE KNOWLEDGE OF HYPERTENSION Description Patient/caregiver will verbalize understanding of HTN including medications, diet, exercise, monitoring, and follow up care with min assist.  Outcome: Progressing Goal: RH STG INCREASE KNOWLEGDE OF HYPERLIPIDEMIA Description Patient/caregiver will verbalize understanding of HLD including medications, diet, exercise, monitoring, and follow up care with min assist.  Outcome: Progressing Goal: RH STG INCREASE KNOWLEDGE OF STROKE PROPHYLAXIS Description Patient/caregiver will verbalize understanding of stroke prophylaxis including medications, diet, exercise, monitoring, and follow up care with min assist.  Outcome: Progressing

## 2018-11-26 ENCOUNTER — Inpatient Hospital Stay (HOSPITAL_COMMUNITY): Payer: Self-pay | Admitting: Physical Therapy

## 2018-11-26 ENCOUNTER — Inpatient Hospital Stay (HOSPITAL_COMMUNITY): Payer: Self-pay | Admitting: Occupational Therapy

## 2018-11-26 ENCOUNTER — Inpatient Hospital Stay (HOSPITAL_COMMUNITY): Payer: Self-pay | Admitting: Speech Pathology

## 2018-11-26 ENCOUNTER — Inpatient Hospital Stay (HOSPITAL_COMMUNITY): Payer: Self-pay

## 2018-11-26 LAB — BASIC METABOLIC PANEL
Anion gap: 13 (ref 5–15)
BUN: 8 mg/dL (ref 8–23)
CO2: 24 mmol/L (ref 22–32)
Calcium: 9.5 mg/dL (ref 8.9–10.3)
Chloride: 103 mmol/L (ref 98–111)
Creatinine, Ser: 0.77 mg/dL (ref 0.44–1.00)
GFR calc Af Amer: >60 mL/min (ref >60)
GFR calc non Af Amer: >60 mL/min (ref >60)
Glucose, Bld: 98 mg/dL (ref 70–99)
Potassium: 4 mmol/L (ref 3.5–5.1)
Sodium: 140 mmol/L (ref 135–145)

## 2018-11-26 LAB — CBC
HCT: 24.9 % — ABNORMAL LOW (ref 36.0–46.0)
Hemoglobin: 8 g/dL — ABNORMAL LOW (ref 12.0–15.0)
MCH: 30.2 pg (ref 26.0–34.0)
MCHC: 32.1 g/dL (ref 30.0–36.0)
MCV: 94 fL (ref 80.0–100.0)
Platelets: 189 10*3/uL (ref 150–400)
RBC: 2.65 MIL/uL — ABNORMAL LOW (ref 3.87–5.11)
RDW: 15.9 % — ABNORMAL HIGH (ref 11.5–15.5)
WBC: 6.7 10*3/uL (ref 4.0–10.5)
nRBC: 0 % (ref 0.0–0.2)

## 2018-11-26 NOTE — Progress Notes (Signed)
Occupational Therapy Session Note  Patient Details  Name: Destiny [name] MRN: [id number] Date of Birth: 05/10/1956  Today's Date: 11/26/2018 OT Individual Time: 9628-3662 OT Individual Time Calculation (min): 73 min    Short Term Goals: Week 1:  OT Short Term Goal 1 (Week 1): Pt will complete self care EOB with Mod A for lose of balance recovery  OT Short Term Goal 1 - Progress (Week 1): Met OT Short Term Goal 2 (Week 1): Pt will don shirt with Mod A using hemi dressing strategies  OT Short Term Goal 2 - Progress (Week 1): Met OT Short Term Goal 3 (Week 1): Pt will use Lt hand as gross stabilizer during grooming tasks with Mod A to maintain functional grasp OT Short Term Goal 3 - Progress (Week 1): Met Week 2:  OT Short Term Goal 1 (Week 2): Pt will complete toilet transfer with Min A and LRAD OT Short Term Goal 2 (Week 2): Pt will complete LB dressing with Mod A using hemi dressing techniques  OT Short Term Goal 3 (Week 2): Pt will complete 1 grooming task in standing to improve standing balance/tolerance   Skilled Therapeutic Interventions/Progress Updates:    Upon entering the room, pt seated in wheelchair with no c/o pain and requesting to use bathroom. Pt standing from wheelchair with min A and ambulating 15' into bathroom with RW and min guard. Pt able to void and standing with min guard for clothing management and hygiene. Pt ambulating out of bathroom and standing at sink to wash hands with min guard for balance and min cuing for safety awareness and use of L UE. Pt returning to wheelchair and OT calling pt's spouse to discuss home set up in preparation for family education tomorrow. Caregiver taking several measurements for therapist over the phone this session. Rest of session with focus on NMR for L UE with B coordination tasks. Pt able to unbutton/button 10 buttons of various sizes with cuing to utilize both hands and increased time to complete task. Pt attempting to tie shoe lace  but able to complete 50%. Pt becoming very frustrated with task after multiple attempts. Pt remaining in wheelchair with chair alarm belt donned and call bell within reach upon exiting the room.    Therapy Documentation Precautions:  Precautions Precautions: Fall, Other (comment) Precaution Comments: Lt hemi, abdominal wound + Rt chest port  Restrictions Weight Bearing Restrictions: No General:   Vital Signs: Therapy Vitals Temp: 99.2 F (37.3 C) Temp Source: Oral Pulse Rate: (!) 110 Resp: 19 BP: 104/81 Patient Position (if appropriate): Sitting Oxygen Therapy SpO2: 97 % O2 Device: Room Air Pain:   ADL: ADL Grooming: Supervision/safety Where Assessed-Grooming: Chair Upper Body Bathing: Minimal assistance Where Assessed-Upper Body Bathing: Edge of bed Lower Body Bathing: Maximal assistance Where Assessed-Lower Body Bathing: Edge of bed Upper Body Dressing: Maximal assistance Where Assessed-Upper Body Dressing: Edge of bed Lower Body Dressing: Maximal assistance Where Assessed-Lower Body Dressing: Edge of bed Toileting: Not assessed Toilet Transfer: Not assessed Social research officer, government: Not assessed Vision   Perception    Praxis   Exercises:   Other Treatments:     Therapy/Group: Individual Therapy  Gypsy Decant 11/26/2018, 4:37 PM

## 2018-11-26 NOTE — Progress Notes (Signed)
Social Work Patient ID: Destiny [name], female   DOB: 1956/05/24, 63 y.o.   MRN: [id number]    Diagnosis codes:I63.9, I50.22, C76.3  Height: 5'2             Weight: 198      Patient suffers from  R-CVA and CHF   which impairs her ability to perform daily activities like ADL's and toileting  in the home.  A walker  will not resolve issue with performing activities of daily living.  A wheelchair will allow patient to safely perform daily activities.  Patient is not able to propel themselves in the home using a standard weight wheelchair due to  Endurance and weakness .  Patient can self propel in the lightweight wheelchair.

## 2018-11-26 NOTE — Progress Notes (Signed)
[place] PHYSICAL MEDICINE & REHABILITATION PROGRESS NOTE   Subjective/Complaints:  No issues overnite  ROS: no SOB, CP, Fever, chills, NVD   Objective:   No results found. No results for input(s): WBC, HGB, HCT, PLT in the last 72 hours. No results for input(s): NA, K, CL, CO2, GLUCOSE, BUN, CREATININE, CALCIUM in the last 72 hours.  Intake/Output Summary (Last 24 hours) at 11/26/2018 0741 Last data filed at 11/25/2018 2100 Gross per 24 hour  Intake 421 ml  Output -  Net 421 ml     Physical Exam: Vital Signs Blood pressure 104/76, pulse 99, temperature 97.8 F (36.6 C), temperature source Oral, resp. rate 18, height 5\' 2"  (1.575 m), weight 88.4 kg, SpO2 98 %. Constitutional: No distress . Vital signs reviewed. HEENT: EOMI, oral membranes moist Neck: supple Cardiovascular: RRR without murmur. No JVD    Respiratory: CTA Bilaterally without wheezes or rales. Normal effort , CPAP GI: BS +, non-tender, non-distended  Musc: No edema or tenderness in extremities. Neurologic:alert mild disarthria, increased latency of response Motor: LUE/LLE: Grossly 3-/5 proximal to distal, 5/5 RUE/RLE Skin: abd wound   Assessment/Plan: 1. 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  Physiatrist is providing close team supervision and 24 hour management of active medical problems listed below.  Physiatrist and rehab team continue to assess barriers to discharge/monitor patient progress toward functional and medical goals  Care Tool:  Bathing  Bathing activity did not occur: Refused Body parts bathed by patient: Chest, Abdomen, Right upper leg, Left upper leg, Face, Left arm   Body parts bathed by helper: Front perineal area, Buttocks, Right lower leg, Left lower leg, Right arm Body parts n/a: Front perineal area, Buttocks   Bathing assist  Assist Level: Moderate Assistance - Patient 50 - 74%     Upper Body Dressing/Undressing Upper body dressing   What is the patient wearing?: Pull over shirt    Upper body assist Assist Level: Minimal Assistance - Patient > 75%    Lower Body Dressing/Undressing Lower body dressing      What is the patient wearing?: Pants, Incontinence brief     Lower body assist Assist for lower body dressing: Moderate Assistance - Patient 50 - 74%     Toileting Toileting Toileting Activity did not occur (Clothing management and hygiene only): N/A (no void or bm)  Toileting assist Assist for toileting: Moderate Assistance - Patient 50 - 74%     Transfers Chair/bed transfer  Transfers assist  Chair/bed transfer activity did not occur: Safety/medical concerns  Chair/bed transfer assist level: Minimal Assistance - Patient > 75%     Locomotion Ambulation   Ambulation assist      Assist level: Minimal Assistance - Patient > 75% Assistive device: Walker-rolling Max distance: 70'   Walk 10 feet activity   Assist     Assist level: Minimal Assistance - Patient > 75% Assistive device: Walker-rolling   Walk 50 feet activity   Assist Walk 50 feet with 2 turns activity did not occur: Safety/medical concerns  Assist level: Minimal Assistance - Patient > 75% Assistive device: Walker-rolling    Walk 150 feet activity   Assist Walk 150 feet activity did not occur: Safety/medical concerns         Walk 10 feet on uneven surface  activity   Assist Walk 10 feet on uneven surfaces activity did not occur: Safety/medical concerns  Wheelchair     Assist Will patient use wheelchair at discharge?: Yes Type of Wheelchair: Manual    Wheelchair assist level: Minimal Assistance - Patient > 75% Max wheelchair distance: 133ft    Wheelchair 50 feet with 2 turns activity    Assist        Assist Level: Minimal Assistance - Patient > 75%   Wheelchair 150  feet activity     Assist Wheelchair 150 feet activity did not occur: Safety/medical concerns        Medical Problem List and Plan: 1.Functional deficits and left hemiparesissecondary to embolic cva involving right corona radiata, right subcorital parietal lobe, left cerebellum Continue CIR PT, OT, SLP, ELOS 5/14  2. Antithrombotics: -DVT/anticoagulation:Mechanical:Sequential compression devices, below kneeBilateral lower extremities -antiplatelet therapy: Low dose ASA and Plavix resumed, hgb 8.8 3. Pain Management:Tylenol prn 4. Mood:LCSW to follow for evaluation and support. -antipsychotic agents: N/A 5. Neuropsych: This patientiscapable of making decisions on herown behalf. 6. Skin/Wound Care:local care to abd 7. Fluids/Electrolytes/Nutrition:follow I's and O's. BMET nl 5/5   -continue to push PO  -dietician following 8. H/o Ovarian CA: Chemo on hold. 9. Reactive depression/Anxiety: ON Effexor and Paxil. 10. Hyperactive bladder: Continue Ditropan. 11. Thrombocytopenia:: Resolved  Platelets 269 on 4/28, plt 383 on 5/4 12.  Chronic systolic CHF: Continue daily weights. 1800 cc FR. ON spironolactone and Crestor. Off Demadex and metoprolol at this time.  Filed Weights   11/23/18 0429 11/24/18 0508 11/25/18 0500  Weight: 89.1 kg 89.2 kg 88.4 kg  remains stable 5/11 no clinical sign of edema 13. Pancytopenia: Improved, still anemic Hx ovarian ca with chemo  Hgb stable at 8.4 on 4/28, improved to 8.8 on 5/4 14. Hypokalemia: Improved with supplementation.    Potassium 4.1on 5/4  LOS: 17 days A FACE TO [place] E Darryle Dennie 11/26/2018, 7:41 AM

## 2018-11-26 NOTE — Progress Notes (Signed)
Physical Therapy Session Note  Patient Details  Name: Destiny [name] MRN: [id number] Date of Birth: 10-14-1955  Today's Date: 11/26/2018 PT Individual Time: 1150-1235 PT Individual Time Calculation (min): 45 min   Short Term Goals: Week 3:  PT Short Term Goal 1 (Week 3): STG=LTG due to ELOS  Skilled Therapeutic Interventions/Progress Updates:    Patient in w/c in room.  Pushed in w/c to dayroom.  Patient sit to stand min a and ambulated 60' turning halfway with min A cues for safety with turns and walker proximity.  Gait around chairs and over walking stick on floor with RW and min A cues for proximity to item to step over and for sequencing.  Patient ambulated 30' to Nu Step and performed x 6 min UE/LE level 3.  Standing balance without walker intermittent UE support and min A when reaching to play large Connect 4 game x 3 rounds.  Patient with cues for safety backing up to seat and for reaching back to sit.  Ambulated x 30' to chair initially with good foot clearance, then shuffling feet cues for walker proximity.  Assisted in w/c to room and left in w/c with alarm belt activated and call bell in reach.   Therapy Documentation Precautions:  Precautions Precautions: Fall, Other (comment) Precaution Comments: Lt hemi, abdominal wound + Rt chest port  Restrictions Weight Bearing Restrictions: No   Pain: Pain Assessment Pain Scale: 0-10 Pain Score: 0-No pain   Therapy/Group: Individual Therapy  Reginia Naas  Magda Kiel, PT 11/26/2018, 12:13 PM

## 2018-11-26 NOTE — Progress Notes (Signed)
Physical Therapy Session Note  Patient Details  Name: Destiny [name] MRN: [id number] Date of Birth: October 11, 1955  Today's Date: 11/26/2018 PT Individual Time: 8241-7530 PT Individual Time Calculation (min): 45 min   Short Term Goals: Week 3:  PT Short Term Goal 1 (Week 3): STG=LTG due to ELOS  Skilled Therapeutic Interventions/Progress Updates: Pt presented in bed with NT present waking pt. Pt agreeable to therapy. Pt performed supine to sit with bed flat with minA and use of bed features. Pt donned scrub top with supervision and increased time. Pt with soiled brief (urine), performed STS with minA and pt was able to perform peri-care with washcloth with CGA. Pt required minA for threading pants in sitting position and was able to complete pulling pants over hips in standing with RW and minA. Pt then trasferred to w/c CGA and transported to ADL apt. Practiced couch transfer with pt requiring modA from lower surface. Instructed pt in sitting next to R armrest to provide more stable surface for pt to push up from. Pt then transferred and performed car transfer. Pt required minA for getting into car due to assistance with LLE management. Pt was able to complete sequencing appropriately. Pt was able to get out of car and perform ambulatory transfer to w/c with CGA. Pt then transported to [place] and ambulated with RW to room. Pt initially CGA then requiring minA with fatigue and increased distractions. Pt required increased cues for safety with RW with fatigue. Pt returned to w/c at end of session and left with belt alarm on, call bell within reach and needs met.    Therapy Documentation Precautions:  Precautions Precautions: Fall, Other (comment) Precaution Comments: Lt hemi, abdominal wound + Rt chest port  Restrictions Weight Bearing Restrictions: No General:   Vital Signs: Therapy Vitals Temp: 99.2 F (37.3 C) Temp Source: Oral Pulse Rate: (!) 110 Resp: 19 BP: 104/81 Patient  Position (if appropriate): Sitting Oxygen Therapy SpO2: 97 % O2 Device: Room Air Pain:     Therapy/Group: Individual Therapy  Hayden Mabin  Skyylar Kopf, PTA  11/26/2018, 5:02 PM

## 2018-11-26 NOTE — Progress Notes (Signed)
Speech Language Pathology Daily Session Note  Patient Details  Name: Destiny [name] MRN: [id number] Date of Birth: 03-28-1956  Today's Date: 11/26/2018 SLP Individual Time: 0930-1030 SLP Individual Time Calculation (min): 60 min  Short Term Goals: Week 3: SLP Short Term Goal 1 (Week 3): Patient will demonstrate functional problem solving for basic medication management with supervision cues.  SLP Short Term Goal 2 (Week 3): Patient will demonstrate selective attention to functional tasks for ~30 minutes with supervision cues for redirection in a mildly distracting enviornment.  SLP Short Term Goal 3 (Week 3): Pt will solve basic hypothetical problem solving situations targeting home environment with supervision cues.   Skilled Therapeutic Interventions:  Skilled treatment session focused on cognition goals. SLP facilitated session by providing superviison cues to complete sem-complex deductive reasoning puzzle. Pt able to demonstrate increased insight during task and self-corrected several answers. Pt also demonstrated increased selective attention as she was supervision to Mod I for redirection even when environment became moderately distracting. Pt left upright in wheelchair, lap belt alarm on and all needs within reach. Continue per current plan of care.      Pain Pain Assessment Pain Scale: 0-10 Pain Score: 0-No pain  Therapy/Group: Individual Therapy  Kaelee Pfeffer 11/26/2018, 11:02 AM

## 2018-11-27 ENCOUNTER — Inpatient Hospital Stay (HOSPITAL_COMMUNITY): Payer: Self-pay | Admitting: Occupational Therapy

## 2018-11-27 ENCOUNTER — Encounter (HOSPITAL_COMMUNITY): Payer: Self-pay | Admitting: Speech Pathology

## 2018-11-27 ENCOUNTER — Encounter (HOSPITAL_COMMUNITY): Payer: Self-pay | Admitting: Occupational Therapy

## 2018-11-27 ENCOUNTER — Ambulatory Visit (HOSPITAL_COMMUNITY): Payer: Self-pay | Admitting: Physical Therapy

## 2018-11-27 NOTE — Progress Notes (Signed)
Social Work Patient ID: Destiny [name], female   DOB: 05/05/1956, 63 y.o.   MRN: [id number] Husband here to go through therapies with pt today and see  Her care needs. Answered his questions regarding equipment and home health follow up. He had medical questions referred him to [facility] MD's regariding resumption of chemo.

## 2018-11-27 NOTE — Progress Notes (Signed)
[place] PHYSICAL MEDICINE & REHABILITATION PROGRESS NOTE   Subjective/Complaints:  Patient had a good night last night she had no problems in her therapies.  Denies any pains, tolerating CPAP  ROS: no SOB, CP, Fever, chills, NVD   Objective:   No results found. Recent Labs    11/26/18 0705  WBC 6.7  HGB 8.0*  HCT 24.9*  PLT 189   Recent Labs    11/26/18 0705  NA 140  K 4.0  CL 103  CO2 24  GLUCOSE 98  BUN 8  CREATININE 0.77  CALCIUM 9.5    Intake/Output Summary (Last 24 hours) at 11/27/2018 0751 Last data filed at 11/26/2018 1405 Gross per 24 hour  Intake 120 ml  Output -  Net 120 ml     Physical Exam: Vital Signs Blood pressure 122/86, pulse 95, temperature (!) 97.5 F (36.4 C), temperature source Oral, resp. rate 17, height 5\' 2"  (1.575 m), weight 88.4 kg, SpO2 99 %. Constitutional: No distress . Vital signs reviewed. HEENT: EOMI, oral membranes moist Neck: supple Cardiovascular: RRR without murmur. No JVD    Respiratory: CTA Bilaterally without wheezes or rales. Normal effort , CPAP GI: BS +, non-tender, non-distended  Musc: No edema or tenderness in extremities. Neurologic:alert mild disarthria, increased latency of response Motor: LUE/LLE: Grossly 3-/5 proximal to distal, 5/5 RUE/RLE Skin: abd wound   Assessment/Plan: 1. 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  Physiatrist is providing close team supervision and 24 hour management of active medical problems listed below.  Physiatrist and rehab team continue to assess barriers to discharge/monitor patient progress toward functional and medical goals  Care Tool:  Bathing  Bathing activity did not occur: Refused Body parts bathed by patient: Chest, Abdomen, Right upper leg, Left upper leg, Face, Left arm   Body parts bathed by helper: Front  perineal area, Buttocks, Right lower leg, Left lower leg, Right arm Body parts n/a: Front perineal area, Buttocks   Bathing assist Assist Level: Moderate Assistance - Patient 50 - 74%     Upper Body Dressing/Undressing Upper body dressing   What is the patient wearing?: Pull over shirt    Upper body assist Assist Level: Minimal Assistance - Patient > 75%    Lower Body Dressing/Undressing Lower body dressing      What is the patient wearing?: Pants, Incontinence brief     Lower body assist Assist for lower body dressing: Moderate Assistance - Patient 50 - 74%     Toileting Toileting Toileting Activity did not occur Landscape architect and hygiene only): N/A (no void or bm)  Toileting assist Assist for toileting: Minimal Assistance - Patient > 75%     Transfers Chair/bed transfer  Transfers assist  Chair/bed transfer activity did not occur: Safety/medical concerns  Chair/bed transfer assist level: Minimal Assistance - Patient > 75%     Locomotion Ambulation   Ambulation assist      Assist level: Minimal Assistance - Patient > 75% Assistive device: Walker-rolling Max distance: 20'   Walk 10 feet activity   Assist     Assist level: Minimal Assistance - Patient > 75% Assistive device: Walker-rolling, Orthosis(L hand splint)   Walk 50 feet activity   Assist Walk 50 feet with 2 turns activity did not occur: Safety/medical concerns  Assist level: Minimal Assistance - Patient > 75% Assistive device: Walker-rolling(L hand splint)    Walk  150 feet activity   Assist Walk 150 feet activity did not occur: Safety/medical concerns         Walk 10 feet on uneven surface  activity   Assist Walk 10 feet on uneven surfaces activity did not occur: Safety/medical concerns         Wheelchair     Assist Will patient use wheelchair at discharge?: Yes Type of Wheelchair: Manual    Wheelchair assist level: Minimal Assistance - Patient > 75% Max  wheelchair distance: 135ft    Wheelchair 50 feet with 2 turns activity    Assist        Assist Level: Minimal Assistance - Patient > 75%   Wheelchair 150 feet activity     Assist Wheelchair 150 feet activity did not occur: Safety/medical concerns        Medical Problem List and Plan: 1.Functional deficits and left hemiparesissecondary to embolic cva involving right corona radiata, right subcorital parietal lobe, left cerebellum Continue CIR PT, OT, SLP, ELOS 5/14 , team conf in am  2. Antithrombotics: -DVT/anticoagulation:Mechanical:Sequential compression devices, below kneeBilateral lower extremities -antiplatelet therapy: Low dose ASA and Plavix resumed, hgb 8.0 3. Pain Management:Tylenol prn 4. Mood:LCSW to follow for evaluation and support. -antipsychotic agents: N/A 5. Neuropsych: This patientiscapable of making decisions on herown behalf. 6. Skin/Wound Care:local care to abd 7. Fluids/Electrolytes/Nutrition:follow I's and O's. BMET nl 5/5   -continue to push PO  -dietician following 8. H/o Ovarian CA: Chemo on hold. 9. Reactive depression/Anxiety: ON Effexor and Paxil. 10. Hyperactive bladder: Continue Ditropan. 11. Thrombocytopenia:: Resolved  Platelets 269 on 4/28, plt 383 on 5/4 12.  Chronic systolic CHF: Continue daily weights. 1800 cc FR. ON spironolactone and Crestor. Off Demadex and metoprolol at this time.  Filed Weights   11/23/18 0429 11/24/18 0508 11/25/18 0500  Weight: 89.1 kg 89.2 kg 88.4 kg  remains stable 5/11 no clinical sign of edema 13. Pancytopenia: Improved, still anemic Hx ovarian ca with chemo  Hgb stable at 8.4 on 4/28, improved to 8.8 on 5/4, down to 8.0 on 5/11 will check stool guaiac, platelets are normal 14. Hypokalemia: Improved with supplementation.    Potassium 4.1on 5/4  LOS: 18 days A FACE TO [place] E Masey Scheiber 11/27/2018, 7:51 AM

## 2018-11-27 NOTE — Progress Notes (Signed)
Occupational Therapy Session Note  Patient Details  Name: Destiny [name] MRN: [id number] Date of Birth: November 04, 1955  Today's Date: 11/27/2018 OT Individual Time: 0211-1735 OT Individual Time Calculation (min): 30 min    Short Term Goals: Week 2:  OT Short Term Goal 1 (Week 2): Pt will complete toilet transfer with Min A and LRAD OT Short Term Goal 2 (Week 2): Pt will complete LB dressing with Mod A using hemi dressing techniques  OT Short Term Goal 3 (Week 2): Pt will complete 1 grooming task in standing to improve standing balance/tolerance   Skilled Therapeutic Interventions/Progress Updates:    Patient in bed upon arrival, flat affect, pleasant and cooperative.  Supine to SSP with min a.  Patient requires CGA in unsupported sitting.  Completed OH shirt min A and donning pants mod A seated at edge of bed.  SPT to/from bed and w/c with min a.  Standing at bed rail with CGA for changing soiled brief (pants up/down and hygiene with mod A).  Grooming seated in w/c with S.  Patient remained in w/c at close of session with seat belt alarm on and tray table/call bell in reach.    Therapy Documentation Precautions:  Precautions Precautions: Fall, Other (comment) Precaution Comments: Lt hemi, abdominal wound + Rt chest port  Restrictions Weight Bearing Restrictions: No General:   Vital Signs:  Pain: Pain Assessment Pain Scale: 0-10 Pain Score: 0-No pain Other Treatments:     Therapy/Group: Individual Therapy  Carlos Levering 11/27/2018, 12:06 PM

## 2018-11-27 NOTE — Progress Notes (Signed)
Physical Therapy Session Note  Patient Details  Name: Destiny [name] MRN: [id number] Date of Birth: June 14, 1956  Today's Date: 11/27/2018 PT Individual Time: 1000-1045 PT Individual Time Calculation (min): 45 min   Short Term Goals: Week 3:  PT Short Term Goal 1 (Week 3): STG=LTG due to ELOS  Skilled Therapeutic Interventions/Progress Updates:  Pt presented in w/c with husband Kasandra Knudsen present agreeable to therapy. Discussed use of rollator vs RW with PTA advising best practice to use RW at this time due to stability. PTA also advising of transporting pt to entry of house from car via w/c due to distance and downgrade of ramp with husband verbalizing understanding.  Session focused on family education with main focus on transfers. Pt transported to ortho gym and demonstrated car transfer to sedan height vehicle. Pt performed ambulatory transfer with minA for sequencing and LE management. Husband was instructed in appropriate guarding/blocking with fair demonstration. Husband required min cues for safety however demonstrated good potential for carryover. Pt then performed furniture transfer to recliner with PTA providing cues for safety (blocking recliner to minimize rocking and providing assist from lower surfaces). Pt then performed w/c mobility x 20 ft for forced use of LUE at minA level.      Therapy Documentation Precautions:  Precautions Precautions: Fall, Other (comment) Precaution Comments: Lt hemi, abdominal wound + Rt chest port  Restrictions Weight Bearing Restrictions: No General:   Vital Signs: Therapy Vitals Temp: 99.4 F (37.4 C) Temp Source: Oral Pulse Rate: (!) 112 Resp: 18 BP: 110/83 Patient Position (if appropriate): Sitting Oxygen Therapy SpO2: 100 % O2 Device: Room Air Pain: Pain Assessment Pain Scale: 0-10 Pain Score: 0-No pain   Therapy/Group: Individual Therapy  Marvine Encalade  Percell Lamboy, PTA  11/27/2018, 3:04 PM

## 2018-11-27 NOTE — Progress Notes (Signed)
Speech Language Pathology Daily Session Note  Patient Details  Name: Destiny [name] MRN: [id number] Date of Birth: 07-23-1955  Today's Date: 11/27/2018 SLP Individual Time: 6270-3500 SLP Individual Time Calculation (min): 40 min  Short Term Goals: Week 3: SLP Short Term Goal 1 (Week 3): Patient will demonstrate functional problem solving for basic medication management with supervision cues.  SLP Short Term Goal 2 (Week 3): Patient will demonstrate selective attention to functional tasks for ~30 minutes with supervision cues for redirection in a mildly distracting enviornment.  SLP Short Term Goal 3 (Week 3): Pt will solve basic hypothetical problem solving situations targeting home environment with supervision cues.   Skilled Therapeutic Interventions: Skilled treatment session focused on family education with the patient's husband. Both the patient and her husband were educated in regards to patient's current cognitive impairments and strategies to utilize at home to maximize attention and recall of functional information in order to maximize overall safety and independence with functional and familiar tasks. He verbalized understanding of all information and handouts were given to reinforce information. Patient transferred to the commode per her request and was left with husband present. Continue with current pan of care.      Pain Pain Assessment Pain Scale: 0-10 Pain Score: 0-No pain  Therapy/Group: Individual Therapy  Keedan Sample 11/27/2018, 3:25 PM

## 2018-11-27 NOTE — Progress Notes (Signed)
Patient is on NIV at this time tolerating it well.  

## 2018-11-27 NOTE — Progress Notes (Signed)
Occupational Therapy Session Note  Patient Details  Name: Destiny [name] MRN: [id number] Date of Birth: 06/03/1956  Today's Date: 11/27/2018 OT Individual Time: 1230-1315 and 1515-1600 OT Individual Time Calculation (min): 45 min and 45 min    Short Term Goals: Week 2:  OT Short Term Goal 1 (Week 2): Pt will complete toilet transfer with Min A and LRAD OT Short Term Goal 2 (Week 2): Pt will complete LB dressing with Mod A using hemi dressing techniques  OT Short Term Goal 3 (Week 2): Pt will complete 1 grooming task in standing to improve standing balance/tolerance   Skilled Therapeutic Interventions/Progress Updates:    Session 1: Upon entering the room, pt seated in wheelchair finishing lunch with husband present for hands on family education. OT reviewed pt's goals and recommendation for pt to perform bathing and dressing with sit <>stand from wheelchair at sink for safety. Caregiver verbalized understanding. Caregiver previously caught taking pt to bathroom without RW after PT family education. OT reiterated that pt can ambulate to bathroom during the day with RW and caregiver on L side to provide support as needed. OT also recommended pt transfer on BSC at night for toileting needs. Caregiver demonstrated ability to safely assist pt into bathroom and assist with toileting needs this session. Pt as very tall bed and will have step at home to use in order to get into bed. Pt practicing x 3 reps with caregiver and therapist stepping backwards onto step with min - mod A for safety. Pt returning to wheelchair secondary to fatigue and OT reviewed printed HEP for use of theraputty for L UE. Caregiver asking appropriate questions and social worker entering the room as therapist exited. All needs within reach.   Session 2: Upon entering the room, pt seated on commode chair with NT present in the room. Pt verbalizing cramping in stomach and increased feeling of constipation. Pt attempting to have BM but  able to void urine only. Pt standing with min A for balance for hygiene and clothing management. Pt returning to wheelchair with stand pivot transfer and min A before reporting cramping and need to return to toilet in same manner as mentioned above. OT notified RN who arrived with enema. OT standing with pt with min A for standing balance while RN gave enema. Pt remained seated on toilet with RN remaining as therapist exited the room. All needs within reach.   Therapy Documentation Precautions:  Precautions Precautions: Fall, Other (comment) Precaution Comments: Lt hemi, abdominal wound + Rt chest port  Restrictions Weight Bearing Restrictions: No General:   Vital Signs: Therapy Vitals Temp: 99.4 F (37.4 C) Temp Source: Oral Pulse Rate: (!) 112 Resp: 18 BP: 110/83 Patient Position (if appropriate): Sitting Oxygen Therapy SpO2: 100 % O2 Device: Room Air Pain:   ADL: ADL Grooming: Supervision/safety Where Assessed-Grooming: Chair Upper Body Bathing: Minimal assistance Where Assessed-Upper Body Bathing: Edge of bed Lower Body Bathing: Maximal assistance Where Assessed-Lower Body Bathing: Edge of bed Upper Body Dressing: Maximal assistance Where Assessed-Upper Body Dressing: Edge of bed Lower Body Dressing: Maximal assistance Where Assessed-Lower Body Dressing: Edge of bed Toileting: Not assessed Toilet Transfer: Not assessed Social research officer, government: Not assessed   Therapy/Group: Individual Therapy  Gypsy Decant 11/27/2018, 4:10 PM

## 2018-11-28 ENCOUNTER — Inpatient Hospital Stay (HOSPITAL_COMMUNITY): Payer: Self-pay | Admitting: Physical Therapy

## 2018-11-28 ENCOUNTER — Inpatient Hospital Stay (HOSPITAL_COMMUNITY): Payer: Self-pay | Admitting: Speech Pathology

## 2018-11-28 ENCOUNTER — Inpatient Hospital Stay (HOSPITAL_COMMUNITY): Payer: Self-pay | Admitting: Occupational Therapy

## 2018-11-28 MED ORDER — PREGABALIN 75 MG PO CAPS: 75.0000 mg | ORAL_CAPSULE | Freq: Two times a day (BID) | ORAL | 0 refills | Status: DC

## 2018-11-28 MED ORDER — VENLAFAXINE HCL ER 75 MG PO CP24: 75.0000 mg | ORAL_CAPSULE | Freq: Every day | ORAL | 0 refills | Status: DC

## 2018-11-28 MED ORDER — ACETAMINOPHEN 325 MG PO TABS: 325.0000 mg | ORAL_TABLET | ORAL | Status: AC | PRN

## 2018-11-28 MED ORDER — ROSUVASTATIN CALCIUM 20 MG PO TABS: 20.0000 mg | ORAL_TABLET | Freq: Every day | ORAL | 0 refills | Status: AC

## 2018-11-28 MED ORDER — PREGABALIN 150 MG PO CAPS: 150.0000 mg | ORAL_CAPSULE | Freq: Every day | ORAL | 0 refills | Status: AC

## 2018-11-28 MED ORDER — CLOPIDOGREL BISULFATE 75 MG PO TABS: 75.0000 mg | ORAL_TABLET | Freq: Every day | ORAL | 1 refills | Status: AC

## 2018-11-28 MED ORDER — POTASSIUM CHLORIDE CRYS ER 20 MEQ PO TBCR: 20.0000 meq | EXTENDED_RELEASE_TABLET | Freq: Every day | ORAL | 0 refills | Status: DC

## 2018-11-28 MED ORDER — PAROXETINE HCL 30 MG PO TABS: 30.0000 mg | ORAL_TABLET | Freq: Every day | ORAL | 0 refills | Status: AC

## 2018-11-28 MED ORDER — OXYBUTYNIN CHLORIDE 5 MG PO TABS: 5.0000 mg | ORAL_TABLET | Freq: Two times a day (BID) | ORAL | 0 refills | Status: AC

## 2018-11-28 MED ORDER — POTASSIUM CHLORIDE CRYS ER 20 MEQ PO TBCR
20.0000 meq | EXTENDED_RELEASE_TABLET | Freq: Every day | ORAL | Status: DC
  Administered 2018-11-28 – 2018-11-29 (×2): 20 meq via ORAL
  Filled 2018-11-28 (×2): qty 1

## 2018-11-28 MED ORDER — FAMOTIDINE 20 MG PO TABS: 20.0000 mg | ORAL_TABLET | Freq: Two times a day (BID) | ORAL | 0 refills | Status: AC

## 2018-11-28 MED ORDER — VITAMIN D3 125 MCG (5000 UT) PO CAPS: 5000.0000 [IU] | ORAL_CAPSULE | Freq: Every day | ORAL | 0 refills | Status: AC

## 2018-11-28 MED ORDER — CLONAZEPAM 0.5 MG PO TABS: 0.5000 mg | ORAL_TABLET | Freq: Every evening | ORAL | 0 refills | Status: AC | PRN

## 2018-11-28 MED ORDER — SPIRONOLACTONE 25 MG PO TABS: 12.5000 mg | ORAL_TABLET | Freq: Every day | ORAL | 0 refills | Status: AC

## 2018-11-28 NOTE — Progress Notes (Signed)
Physical Therapy Session Note  Patient Details  Name: Destiny [name] MRN: [id number] Date of Birth: [birth year]  Today's Date: 11/28/2018 PT Individual Time: 0915-1028 PT Individual Time Calculation (min): 73 min   Short Term Goals: Week 3:  PT Short Term Goal 1 (Week 3): STG=LTG due to ELOS  Skilled Therapeutic Interventions/Progress Updates:   Pt presented in bed agreeable to therapy. Pt performed bed mobility minA to sitting. Pt noted to require minA for sitting balance initially at EOB this session. Pt donned shirt and pants with MinA and performed STS with RW minA to complete clothing management. Pt was able to stand and attempt to use BUE to complete clothing management without LOB. Performed ambulatory transfer to w/c CGA and pt transported to ortho gym for care tool d/c activities. Pt performed car transfer and ambulation on compliant surface at minA and ambulation on level tile 60f CGA. Pt propelled w/c supervision level with increased time using BUE. Pt transported back to room at end of session and left with call bell within reach and needs met.   Therapy Documentation Precautions:  Precautions Precautions: Fall, Other (comment) Precaution Comments: Lt hemi, abdominal wound Restrictions Weight Bearing Restrictions: No General:   Vital Signs: Therapy Vitals Temp: 98.9 F (37.2 C) Temp Source: Oral Pulse Rate: (!) 109 Resp: 16 BP: 112/90 Patient Position (if appropriate): Sitting Oxygen Therapy SpO2: 100 % O2 Device: Room Air Pain: Pain Assessment Pain Scale: 0-10 Pain Score: 0-No pain Mobility: Bed Mobility Bed Mobility: Rolling Right;Rolling Left;Supine to Sit;Sit to Supine Rolling Right: Minimal Assistance - Patient > 75% Rolling Left: Minimal Assistance - Patient > 75% Supine to Sit: Minimal Assistance - Patient > 75% Sit to Supine: Minimal Assistance - Patient > 75% Transfers Transfers: Stand to Sit;Sit to Stand Sit to Stand: Minimal Assistance - Patient  > 75% Stand to Sit: Contact Guard/Touching assist Transfer (Assistive device): Rolling walker Locomotion :    Trunk/Postural Assessment : Cervical Assessment Cervical Assessment: Exceptions to WFL(forward head) Thoracic Assessment Thoracic Assessment: Exceptions to WFL(rounded shoulders) Lumbar Assessment Lumbar Assessment: Exceptions to WFL(posterior pelvic tilt) Postural Control Postural Control: Deficits on evaluation Trunk Control: R lean, R posterior lateral LOB while sitting EOB  Balance: Balance Balance Assessed: Yes Static Sitting Balance Static Sitting - Level of Assistance: 5: Stand by assistance Dynamic Sitting Balance Dynamic Sitting - Level of Assistance: 5: Stand by assistance Sitting balance - Comments: R posteriorlateral LOB while sitting EOB threading pants Static Standing Balance Static Standing - Level of Assistance: 5: Stand by assistance Dynamic Standing Balance Dynamic Standing - Level of Assistance: 4: Min assist Exercises:   Other Treatments:      Therapy/Group: Individual Therapy  Hershy Flenner 11/28/2018, 4:31 PM

## 2018-11-28 NOTE — Progress Notes (Signed)
Physical Therapy Discharge Summary  Patient Details  Name: Destiny [name] MRN: [id number] Date of Birth: 1956/03/18  Today's Date: 11/28/2018   Patient has met 9 of 10 long term goals due to improved activity tolerance, improved balance, improved postural control, increased strength, improved attention, improved awareness and improved coordination.  Patient to discharge at an ambulatory level [place].   Patient's care partner is independent to provide the necessary physical assistance at discharge.  Reasons goals not met: Pt continues to demonstrate decreased endurance and L inattention limiting progress with w/c mobility. Pt able to propel w/c 43f with supervision and increased time.   Recommendation:  Patient will benefit from ongoing skilled PT services in home health setting to continue to advance safe functional mobility, address ongoing impairments in balance, attention, initiation, problem soling, balance, gait, transfers, bed mobility, and minimize fall risk.  Equipment: 20x 18 w/c and RW  Reasons for discharge: treatment goals met  Patient/family agrees with progress made and goals achieved: Yes  PT Discharge Precautions/Restrictions Precautions Precaution Comments: Lt hemi, abdominal wound Vital Signs Therapy Vitals Temp: 98.9 F (37.2 C) Temp Source: Oral Pulse Rate: (!) 109 Resp: 16 BP: 112/90 Patient Position (if appropriate): Sitting Oxygen Therapy SpO2: 100 % O2 Device: Room Air Pain Pain Assessment Pain Scale: 0-10 Pain Score: 0-No pain  Cognition Overall Cognitive Status: Within Functional Limits for tasks assessed Arousal/Alertness: Awake/alert Orientation Level: Oriented X4 Sensation Sensation Light Touch: Appears Intact Coordination Gross Motor Movements are Fluid and Coordinated: No Fine Motor Movements are Fluid and Coordinated: No Coordination and Movement Description: Affected by Lt hemiparesis  Motor  Motor Motor: Hemiplegia Motor  - Discharge Observations: improving L hemi  Mobility Bed Mobility Bed Mobility: Rolling Right;Rolling Left;Supine to Sit;Sit to Supine Rolling Right: Minimal Assistance - Patient > 75% Rolling Left: Minimal Assistance - Patient > 75% Supine to Sit: Minimal Assistance - Patient > 75% Sit to Supine: Minimal Assistance - Patient > 75% Transfers Transfers: Stand to Sit;Sit to Stand Sit to Stand: Minimal Assistance - Patient > 75% Stand to Sit: Contact Guard/Touching assist Transfer (Assistive device): Rolling walker Locomotion  Gait Ambulation: Yes Gait Assistance: Contact Guard/Touching assist Gait Distance (Feet): 60 Feet Assistive device: Rolling walker Gait Assistance Details: Verbal cues for precautions/safety Gait Assistance Details: decreased gait speed with poor B foot clearance and narrow BOS Gait Gait: Yes Gait Pattern: Impaired Gait velocity: decreased Stairs / Additional Locomotion Stairs: No Wheelchair Mobility Wheelchair Mobility: Yes Wheelchair Assistance: SChartered loss adjuster Both upper extremities Wheelchair Parts Management: Needs assistance Distance: 535f Trunk/Postural Assessment  Cervical Assessment Cervical Assessment: Exceptions to WFL(forward head) Thoracic Assessment Thoracic Assessment: Exceptions to WFL(rounded shoulders) Lumbar Assessment Lumbar Assessment: Exceptions to WFL(posterior pelvic tilt) Postural Control Postural Control: Deficits on evaluation Trunk Control: R lean, R posterior lateral LOB while sitting EOB  Balance Balance Balance Assessed: Yes Static Sitting Balance Static Sitting - Level of Assistance: 5: Stand by assistance Dynamic Sitting Balance Dynamic Sitting - Level of Assistance: 5: Stand by assistance Sitting balance - Comments: R posteriorlateral LOB while sitting EOB threading pants Static Standing Balance Static Standing - Level of Assistance: 5: Stand by assistance Dynamic Standing  Balance Dynamic Standing - Level of Assistance: 4: Min assist Extremity Assessment  RUE Assessment RUE Assessment: Within Functional Limits LUE Assessment LUE Assessment: Exceptions to [facility] Strength Comments: 3-/5 gross strength RLE Assessment RLE Assessment: Within Functional Limits LLE Assessment LLE Assessment: Exceptions to [facility] Strength Comments: grossly 4/5  Rosita DeChalus 11/28/2018, 4:40 PM

## 2018-11-28 NOTE — Progress Notes (Signed)
PHYSICAL MEDICINE & REHABILITATION PROGRESS NOTE   Subjective/Complaints:  No issues overnite  ROS: no SOB, CP, Fever, chills, NVD   Objective:   No results found. Recent Labs    11/26/18 0705  WBC 6.7  HGB 8.0*  HCT 24.9*  PLT 189   Recent Labs    11/26/18 0705  NA 140  K 4.0  CL 103  CO2 24  GLUCOSE 98  BUN 8  CREATININE 0.77  CALCIUM 9.5    Intake/Output Summary (Last 24 hours) at 11/28/2018 0804 Last data filed at 11/27/2018 0956 Gross per 24 hour  Intake 120 ml  Output -  Net 120 ml     Physical Exam: Vital Signs Blood pressure 121/82, pulse 98, temperature 98 F (36.7 C), temperature source Oral, resp. rate 18, height 5' 2"  (1.575 m), weight 88 kg, SpO2 99 %. Constitutional: No distress . Vital signs reviewed. HEENT: EOMI, oral membranes moist Neck: supple Cardiovascular: RRR without murmur. No JVD    Respiratory: CTA Bilaterally without wheezes or rales. Normal effort , CPAP GI: BS +, non-tender, non-distended  Musc: No edema or tenderness in extremities. Neurologic:alert mild disarthria, increased latency of response Motor: LUE/LLE: Grossly 3-/5 proximal to distal, 5/5 RUE/RLE Skin: abd wound   Assessment/Plan: 1. 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  Physiatrist is providing close team supervision and 24 hour management of active medical problems listed below.  Physiatrist and rehab team continue to assess barriers to discharge/monitor patient progress toward functional and medical goals  Care Tool:  Bathing  Bathing activity did not occur: Refused Body parts bathed by patient: Chest, Abdomen, Right upper leg, Left upper leg, Face, Left arm   Body parts bathed by helper: Front perineal area, Buttocks, Right lower leg, Left lower leg, Right arm Body parts n/a: Front perineal area,  Buttocks   Bathing assist Assist Level: Moderate Assistance - Patient 50 - 74%     Upper Body Dressing/Undressing Upper body dressing   What is the patient wearing?: Pull over shirt    Upper body assist Assist Level: Minimal Assistance - Patient > 75%    Lower Body Dressing/Undressing Lower body dressing      What is the patient wearing?: Pants, Incontinence brief     Lower body assist Assist for lower body dressing: Moderate Assistance - Patient 50 - 74%     Toileting Toileting Toileting Activity did not occur Landscape architect and hygiene only): N/A (no void or bm)  Toileting assist Assist for toileting: Minimal Assistance - Patient > 75%     Transfers Chair/bed transfer  Transfers assist  Chair/bed transfer activity did not occur: Safety/medical concerns  Chair/bed transfer assist level: Minimal Assistance - Patient > 75%     Locomotion Ambulation   Ambulation assist      Assist level: Minimal Assistance - Patient > 75% Assistive device: Walker-rolling Max distance: 20'   Walk 10 feet activity   Assist     Assist level: Minimal Assistance - Patient > 75% Assistive device: Walker-rolling, Orthosis(L hand splint)   Walk 50 feet activity   Assist Walk 50 feet with 2 turns activity did not occur: Safety/medical concerns  Assist level: Minimal Assistance - Patient > 75% Assistive device: Walker-rolling(L hand splint)    Walk 150 feet activity   Assist Walk 150 feet activity did not occur: Safety/medical concerns  Walk 10 feet on uneven surface  activity   Assist Walk 10 feet on uneven surfaces activity did not occur: Safety/medical concerns         Wheelchair     Assist Will patient use wheelchair at discharge?: Yes Type of Wheelchair: Manual    Wheelchair assist level: Minimal Assistance - Patient > 75% Max wheelchair distance: 119f    Wheelchair 50 feet with 2 turns activity    Assist        Assist  Level: Minimal Assistance - Patient > 75%   Wheelchair 150 feet activity     Assist Wheelchair 150 feet activity did not occur: Safety/medical concerns        Medical Problem List and Plan: 1.Functional deficits and left hemiparesissecondary to embolic cva involving right corona radiata, right subcorital parietal lobe, left cerebellum Continue CIR PT, OT, SLP, ELOS 5/14 , Team conference today please see physician documentation under team conference tab, met with team face-to-face to discuss problems,progress, and goals. Formulized individual treatment plan based on medical history, underlying problem and comorbidities. 2. Antithrombotics: -DVT/anticoagulation:Mechanical:Sequential compression devices, below kneeBilateral lower extremities -antiplatelet therapy: Low dose ASA and Plavix resumed, hgb 8.0 3. Pain Management:Tylenol prn 4. Mood:LCSW to follow for evaluation and support. -antipsychotic agents: N/A 5. Neuropsych: This patientiscapable of making decisions on herown behalf. 6. Skin/Wound Care:local care to abd 7. Fluids/Electrolytes/Nutrition:follow I's and O's. BMET nl 5/5   -continue to push PO  -dietician following 8. H/o Ovarian CA: Chemo on hold. 9. Reactive depression/Anxiety: ON Effexor and Paxil. 10. Hyperactive bladder: Continue Ditropan. 11. Thrombocytopenia:: Resolved  Platelets 269 on 4/28, plt 383 on 5/4 12.  Chronic systolic CHF: Continue daily weights. 1800 cc FR. ON spironolactone and Crestor. Off Demadex and metoprolol at this time.  Filed Weights   11/24/18 0508 11/25/18 0500 11/28/18 0550  Weight: 89.2 kg 88.4 kg 88 kg  remains stable 5/11 no clinical sign of edema 13. Pancytopenia: Improved, still anemic Hx ovarian ca with chemo  Hgb stable at 8.4 on 4/28, improved to 8.8 on 5/4, down to 8.0 on 5/11 will check stool guaiac, platelets are normal 14. Hypokalemia: Improved with supplementation.     Potassium 4.1on 5/4  LOS: 19 days [place]/13/2020, 8:04 AM

## 2018-11-28 NOTE — Progress Notes (Signed)
Social Work  Discharge Note  The overall goal for the admission was met for:   Discharge location: Yes-HOME WITH HUSBAND WHO CAN PROVIDE 24 HR CARE  Length of Stay: Yes-18 DAYS  Discharge activity level: Yes-MIN ASSIST LEVEL  Home/community participation: Yes  Services provided included: MD, RD, PT, OT, SLP, RN, CM, Pharmacy, Neuropsych and SW  Financial Services: Other: PENDING MEDICAID  Follow-up services arranged: [facility],OT,SPT,RN,AIDE,SW, DME: [facility] [place] and Patient/Family has no preference for HH/DME agencies  Comments (or additional information):HUSBAND WAS [place] PENDING. MATCH GIVEN TO PT FOR MEDICATION ASSISTANCE  Patient/Family verbalized understanding of follow-up arrangements: Yes  Individual responsible for coordination of the follow-up plan: DANNY-HUSBAND  Confirmed correct DME delivered: Elease Hashimoto 11/28/2018    Elease Hashimoto

## 2018-11-28 NOTE — Progress Notes (Signed)
Occupational Therapy Discharge Summary  Patient Details  Name: Destiny [name] MRN: [id number] Date of Birth: 24-Mar-1956  Today's Date: 11/28/2018 OT Individual Time: 1230-1330 OT Individual Time Calculation (min): 60 min    Patient has met 10 of 10 long term goals due to improved activity tolerance, improved balance, ability to compensate for deficits, functional use of  LEFT upper and LEFT lower extremity, improved attention, improved awareness and improved coordination.  Patient to discharge at [facility] Assist level.  Patient's care partner, Kasandra Knudsen, is independent to provide the necessary physical and cognitive assistance at discharge.  He attended hands on family education 11/27/18.  Reasons goals not met: all goals met  Recommendation:  Patient will benefit from ongoing skilled OT services in home health setting to continue to advance functional skills in the area of BADL and Reduce care partner burden.  Equipment: No equipment provided  Reasons for discharge: treatment goals met  Patient/family agrees with progress made and goals achieved: Yes   OT Intervention: Upon entering the room, pt seated in wheelchair with lunch tray. Pt with no c/o pain this session and agreeable to OT intervention. Pt utilizing B UEs to open containers and feed self this session. Once finished, Pt washing hands with supervision overall and increased time to sequence and problem solve. Pt needing mod cuing and min A to propel wheelchair back to place beside the bed. Pt utilized putty and utensils to practice cutting. Pt able to hold fork into putty with L hand while attending to it but as soon as attention focused on knife in R UE grasp of fork released completely. Pt reviewed HEP with theraputty and remained seated in wheelchair at end of session with call bell and all needed items within reach. Chair alarm belt activated.  OT Discharge Precautions/Restrictions  Precautions Precaution Comments: Lt hemi,  abdominal wound   Pain Pain Assessment Pain Scale: 0-10 Pain Score: 0-No pain ADL ADL Grooming: Supervision/safety Where Assessed-Grooming: Chair Upper Body Bathing: Minimal assistance Where Assessed-Upper Body Bathing: Edge of bed Lower Body Bathing: Maximal assistance Where Assessed-Lower Body Bathing: Edge of bed Upper Body Dressing: Maximal assistance Where Assessed-Upper Body Dressing: Edge of bed Lower Body Dressing: Maximal assistance Where Assessed-Lower Body Dressing: Edge of bed Toileting: Not assessed Toilet Transfer: Not assessed Social research officer, government: Not assessed Vision Baseline Vision/History: Wears glasses Wears Glasses: Reading only Patient Visual Report: No change from baseline Vision Assessment?: No apparent visual deficits Cognition Overall Cognitive Status: Within Functional Limits for tasks assessed Arousal/Alertness: Awake/alert Orientation Level: Oriented X4 Attention: Selective Selective Attention: Appears intact Memory: Appears intact(at baseline) Awareness: Appears intact Problem Solving: Appears intact(basic cognition, pt is at baseline) Safety/Judgment: Appears intact Sensation Sensation Light Touch: Appears Intact Coordination Gross Motor Movements are Fluid and Coordinated: No Fine Motor Movements are Fluid and Coordinated: No Coordination and Movement Description: Affected by Lt hemiparesis  Motor  Motor Motor: Hemiplegia Motor - Discharge Observations: improving L hemi Mobility  Bed Mobility Bed Mobility: Rolling Right;Rolling Left;Supine to Sit;Sit to Supine Rolling Right: Minimal Assistance - Patient > 75% Rolling Left: Minimal Assistance - Patient > 75% Supine to Sit: Minimal Assistance - Patient > 75% Sit to Supine: Minimal Assistance - Patient > 75% Transfers Sit to Stand: Minimal Assistance - Patient > 75% Stand to Sit: Contact Guard/Touching assist  Trunk/Postural Assessment  Cervical Assessment Cervical Assessment:  Exceptions to WFL(forward head) Thoracic Assessment Thoracic Assessment: Exceptions to WFL(rounded shoulders) Lumbar Assessment Lumbar Assessment: Exceptions to WFL(posterior pelvic tilt) Postural Control Postural  Control: Deficits on evaluation Trunk Control: R lean, R posterior lateral LOB while sitting EOB Righting Reactions: Delayed;  Balance Balance Balance Assessed: Yes Static Sitting Balance Static Sitting - Level of Assistance: 5: Stand by assistance Dynamic Sitting Balance Dynamic Sitting - Level of Assistance: 5: Stand by assistance Sitting balance - Comments: R posteriorlateral LOB while sitting EOB threading pants Static Standing Balance Static Standing - Level of Assistance: 5: Stand by assistance Dynamic Standing Balance Dynamic Standing - Level of Assistance: 4: Min assist Extremity/Trunk Assessment RUE Assessment RUE Assessment: Within Functional Limits LUE Assessment LUE Assessment: Exceptions to [facility] General Strength Comments: 3-/5 gross strength   Trey Gulbranson P 11/28/2018, 12:50 PM

## 2018-11-28 NOTE — Patient Care Conference (Addendum)
Inpatient RehabilitationTeam Conference and Plan of Care Update Date: 11/28/2018   Time: 10:40 AM    Patient Name: Destiny [name]      Medical Record Number: [id number]  Date of Birth: 1955-10-24 Sex: Female         Room/Bed: 4W08C/4W08C-01 Payor Info: Payor: MEDICAID PENDING / Plan: MEDICAID PENDING / Product Type: *No Product type* /    Admitting Diagnosis: Rt CVA; 0106D; 21-24days  Admit Date/Time:  11/09/2018  2:58 PM Admission Comments: No comment available   Primary Diagnosis:  <principal problem not specified> Principal Problem: <principal problem not specified>  Patient Active Problem List   Diagnosis Date Noted  . Acute blood loss anemia   . Chronic systolic congestive heart failure ([place])   . Embolic stroke ([place]) 23/53/6144  . 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 Thrombocytopenia ([place])   . Cerebrovascular accident (CVA) ([place])   . HLD (hyperlipidemia) 11/02/2018  . GERD (gastroesophageal reflux disease) 11/02/2018  . CAD (coronary artery disease) 11/02/2018  . Sleep apnea 11/02/2018  . Hypotension 11/02/2018  . Somnolence 11/02/2018  . Chronic diastolic heart failure ([place]) 11/15/2017  . HTN (hypertension) 11/15/2017  . Hypokalemia 11/05/2017  . Chest pain 02/14/2016  . Small bowel obstruction ([place]) 06/16/2015    Expected Discharge Date: Expected Discharge Date: 11/29/18  Team Members Present: Physician leading conference: Dr. Alysia Penna Social Worker Present: Ovidio Kin, LCSW Nurse Present: Rayetta Pigg, RN PT Present: Barrie Folk, PT;Rosita Dechalus, PTA OT Present: Darleen Crocker, OT SLP Present: Charolett Bumpers, SLP PPS Coordinator present : Gunnar Fusi     Current Status/Progress Goal Weekly Team Focus  Medical   Incont of urine., Mainly at noc , poor notification of staff, wound looking better  reduce fall risk, safety during ambulation  d/c planning    Bowel/Bladder   continent of B/B, LBM 11/27/2018  maintain continence of  bladder, maintain regular B/B routine  toilet q 2 hours and prn   Swallow/Nutrition/ Hydration             ADL's   min A overall for self care with use of RW and bathing at sink with focus on sit <>stand  min A overall  L NMR, family education, d/c planning, functional transfers, self care retraining   Mobility   minA bed mobility, transfers, CGA gait short distances (30-71f) fading to minA with fatigue. Continues to demonstrate decreased awareness on L. Family education performed 5/13  minA overall  L NMR, family ed, gait, balance   Communication             Safety/Cognition/ Behavioral Observations  Supervision with basic cognition - at goal and baseline level  Supervision with basic cognition - downgraded 11/16/18 d/t baseline abilities  basic problem solving, selective attention   Pain   denies pain  remain pain free  assess pain q shift and prn   Skin   masd to perineal area, ecchymosis to abdomen, arms, legs, eith all areas making improvement  promote healing, no new skin issues, maintain skin integrity  assess skin q shift and prn      *See Care Plan and progress notes for long and short-term goals.     Barriers to Discharge  Current Status/Progress Possible Resolutions Date Resolved   Physician    Medical stability;Decreased caregiver support  husband with poor safety awareness  should be ready for d/c in am  Set up HHPT, OT , RN      Nursing  PT                    OT                  SLP                SW                Discharge Planning/Teaching Needs:  Husband was here yesterday to go through education and learn her care. He feels he can provide the care she will need at DC. MA is still pending.      Team Discussion:  Progressing toward her goals of min assist level. Family education completed yesterday and both comfortable with her care. Diet met Dys 3 and skin issues are healed, dry dressing at home. Will need RN to follow to make sure  healed, also see surgeon as OP. Timed toileting for continence.  Revisions to Treatment Plan:  DC 5/14    Continued Need for Acute Rehabilitation Level of Care: The patient requires daily medical management by a physician with specialized training in physical medicine and rehabilitation for the following conditions: Daily direction of a multidisciplinary physical rehabilitation program to ensure safe treatment while eliciting the highest outcome that is of practical value to the patient.: Yes Daily medical management of patient stability for increased activity during participation in an intensive rehabilitation regime.: Yes Daily analysis of laboratory values and/or radiology reports with any subsequent need for medication adjustment of medical intervention for : Wound care problems;Neurological problems   I attest that I was present, lead the team conference, and concur with the assessment and plan of the team. Teleconference held due to COVID-19   Elease Hashimoto 11/28/2018, 10:40 AM

## 2018-11-28 NOTE — Discharge Instructions (Signed)
Inpatient Rehab Discharge Instructions  Destiny [name] Discharge date and time: No discharge date for patient encounter.   Activities/Precautions/ Functional Status: Activity: As tolerated Diet: Mechanical soft Wound Care: Routine skin checks Functional status:  ___ No restrictions     ___ Walk up steps independently ___ 24/7 supervision/assistance   ___ Walk up steps with assistance ___ Intermittent supervision/assistance  ___ Bathe/dress independently ___ Walk with walker     _x__ Bathe/dress with assistance ___ Walk Independently    ___ Shower independently ___ Walk with assistance    ___ Shower with assistance ___ No alcohol     ___ Return to work/school ________  Special Instructions: Follow-up oncology hematology services [place] UPON DISCHARGE:   Home Health:   PT, OT, SPT, RN, AIDE, [place]   Date of last service:11/29/2018  Medical Equipment/Items [place]  Agency/Supplier:[facility] [phone]  Other:HUSBAND WORKING ON MEDICAID AND SSD APPLICATIONS BOTH ARE PENDING  GENERAL COMMUNITY RESOURCES FOR PATIENT/FAMILY: Support Groups:[facility] SUPPORT GROUP  HELD AT [place] [phone] FOR INFORMATION REGARDING MEETING TIMES AND ROOM  My questions have been answered and I understand these instructions. I will adhere to these goals and the provided educational materials after my discharge from the hospital.  Patient/Caregiver Signature _______________________________ Date __________  Clinician Signature _______________________________________ Date __________

## 2018-11-28 NOTE — Progress Notes (Signed)
Speech Language Pathology Discharge Summary  Patient Details  Name: Destiny [name] MRN: [id number] Date of Birth: 12-10-1955  Today's Date: 11/28/2018 SLP Individual Time: 0730-0830 SLP Individual Time Calculation (min): 60 min   Skilled Therapeutic Interventions:  Skilled treatment session focused on cognition goals and completion of pt's POC with ST services. SLP received pt in bed, asleep. Pt able to arouse with more than a reasonable amount of time. She was able to demonstrate selective attention for ~ 45 minutes in mildly distracting environment. SLP facilitated discussion about discharge and addressed concerns that pt appropriately voiced. Pt was left upright in bed, bed alarm on and all needs within reach.     Patient has met 7 of 7 long term goals.  Patient to discharge at overall Supervision level.    Clinical Impression/Discharge Summary:   Pt has made good progress in skilled ST and as a result she met all of her LTGs. Additionally, pt is considered at baseline for cognitive function. Extensive time spent with pt's husband in determining baseline abilities and at this time, pt is considered to be at baseline. Pt would benefit from supervision at discharge to ensure physical safety d/t continued weakness. No further ST services are indicated.   Care Partner:  Caregiver Able to Provide Assistance: Yes  Type of Caregiver Assistance: Physical;Cognitive  Recommendation:  None        Reasons for discharge: Treatment goals met;Discharged from hospital   Patient/Family Agrees with Progress Made and Goals Achieved: Yes    Shelly Shoultz 11/28/2018, 9:05 AM

## 2018-11-29 NOTE — Progress Notes (Signed)
[facility] PHYSICAL MEDICINE & REHABILITATION PROGRESS NOTE   Subjective/Complaints:  No issues overnite, pt feels ok this am, aware of d/c, just woke up  ROS: no SOB, CP, Fever, chills, NVD   Objective:   No results found. No results for input(s): WBC, HGB, HCT, PLT in the last 72 hours. No results for input(s): NA, K, CL, CO2, GLUCOSE, BUN, CREATININE, CALCIUM in the last 72 hours. No intake or output data in the 24 hours ending 11/29/18 0737   Physical Exam: Vital Signs Blood pressure 122/83, pulse 99, temperature 98.2 F (36.8 C), temperature source Oral, resp. rate 15, height 5\' 2"  (1.575 m), weight 88.2 kg, SpO2 98 %. Constitutional: No distress . Vital signs reviewed. HEENT: EOMI, oral membranes moist Neck: supple Cardiovascular: RRR without murmur. No JVD    Respiratory: CTA Bilaterally without wheezes or rales. Normal effort , CPAP GI: BS +, non-tender, non-distended  Musc: No edema or tenderness in extremities. Neurologic:alert mild disarthria, increased latency of response Motor: LUE/LLE: Grossly 3-/5 proximal to distal, 5/5 RUE/RLE Skin: abd wound   Assessment/Plan: 1. 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 Stable for D/C today F/u PCP in 3-4 weeks F/u PM&R 2 weeks See D/C summary  See D/C instructions  Care Tool:  Bathing  Bathing activity did not occur: Refused Body parts bathed by patient: Chest, Abdomen, Right upper leg, Left upper leg, Face, Left arm, Right arm, Front perineal area, Right lower leg, Left lower leg   Body parts bathed by helper: Buttocks Body parts n/a: Front perineal area, Buttocks   Bathing assist Assist Level: Minimal Assistance - Patient > 75%     Upper Body Dressing/Undressing Upper body dressing   What is the patient wearing?: Pull over shirt    Upper body assist Assist Level: Set up assist    Lower Body Dressing/Undressing Lower body dressing      What is the patient  wearing?: Pants, Incontinence brief     Lower body assist Assist for lower body dressing: Minimal Assistance - Patient > 75%     Toileting Toileting Toileting Activity did not occur (Clothing management and hygiene only): N/A (no void or bm)  Toileting assist Assist for toileting: Minimal Assistance - Patient > 75%     Transfers Chair/bed transfer  Transfers assist  Chair/bed transfer activity did not occur: Safety/medical concerns  Chair/bed transfer assist level: Contact Guard/Touching assist     Locomotion Ambulation   Ambulation assist      Assist level: Contact Guard/Touching assist Assistive device: Walker-rolling Max distance: 48ft   Walk 10 feet activity   Assist     Assist level: Contact Guard/Touching assist Assistive device: Walker-rolling   Walk 50 feet activity   Assist Walk 50 feet with 2 turns activity did not occur: Safety/medical concerns  Assist level: Contact Guard/Touching assist Assistive device: Walker-rolling    Walk 150 feet activity   Assist Walk 150 feet activity did not occur: Safety/medical concerns         Walk 10 feet on uneven surface  activity   Assist Walk 10 feet on uneven surfaces activity did not occur: Safety/medical concerns   Assist level: Minimal Assistance - Patient > 75%     Wheelchair     Assist Will patient use wheelchair at discharge?: Yes Type of Wheelchair: Manual    Wheelchair assist level: Supervision/Verbal cueing Max wheelchair distance: 7ft    Wheelchair 50 feet with 2 turns activity    Assist  Assist Level: Supervision/Verbal cueing   Wheelchair 150 feet activity     Assist Wheelchair 150 feet activity did not occur: Safety/medical concerns        Medical Problem List and Plan: 1.Functional deficits and left hemiparesissecondary to embolic cva involving right corona radiata, right subcorital parietal lobe, left cerebellum Continue CIR  PT, OT, SLP, ELOS 5/14 , 2. Antithrombotics: -DVT/anticoagulation:Mechanical:Sequential compression devices, below kneeBilateral lower extremities -antiplatelet therapy: Low dose ASA and Plavix resumed, hgb 8.0 3. Pain Management:Tylenol prn 4. Mood:LCSW to follow for evaluation and support. -antipsychotic agents: N/A 5. Neuropsych: This patientiscapable of making decisions on herown behalf. 6. Skin/Wound Care:local care to abd 7. Fluids/Electrolytes/Nutrition:follow I's and O's. BMET nl 5/5   -continue to push PO  -dietician following 8. H/o Ovarian CA: Chemo on hold. 9. Reactive depression/Anxiety: ON Effexor and Paxil. 10. Hyperactive bladder: Continue Ditropan. 11. Thrombocytopenia:: Resolved  Platelets 269 on 4/28, plt 383 on 5/4 12.  Chronic systolic CHF: Continue daily weights. 1800 cc FR. ON spironolactone and Crestor. Off Demadex and metoprolol at this time.  Filed Weights   11/25/18 0500 11/28/18 0550 11/29/18 0628  Weight: 88.4 kg 88 kg 88.2 kg  remains stable 5/14 no clinical sign of edema 13. Pancytopenia: Improved, still anemic Hx ovarian ca with chemo  Hgb stable at 8.4 on 4/28, improved to 8.8 on 5/4, down to 8.0 on 5/11 will f/u CBC with PCP  14. Hypokalemia: Improved with supplementation.    Potassium 4.1on 5/4  LOS: 20 days A FACE TO [place] E  11/29/2018, 7:37 AM

## 2018-11-29 NOTE — Plan of Care (Signed)
  Problem: Consults Goal: RH STROKE PATIENT EDUCATION Description See Patient Education module for education specifics  Outcome: Completed/Met   Problem: RH BOWEL ELIMINATION Goal: RH STG MANAGE BOWEL WITH ASSISTANCE Description STG Manage Bowel with min Assistance.  Outcome: Completed/Met   Problem: RH BLADDER ELIMINATION Goal: RH STG MANAGE BLADDER WITH ASSISTANCE Description STG Manage Bladder With min Assistance  Outcome: Completed/Met   Problem: RH SKIN INTEGRITY Goal: RH STG SKIN FREE OF INFECTION/BREAKDOWN Description Patients skin will remain free from further infection or breakdown with mod assist.  Outcome: Completed/Met Goal: RH STG MAINTAIN SKIN INTEGRITY WITH ASSISTANCE Description STG Maintain Skin Integrity With mod Assistance.  Outcome: Completed/Met Goal: RH STG ABLE TO PERFORM INCISION/WOUND CARE W/ASSISTANCE Description STG Able To Perform Incision/Wound Care With mod/max Assistance.  Outcome: Completed/Met   Problem: RH KNOWLEDGE DEFICIT Goal: RH STG INCREASE KNOWLEDGE OF HYPERTENSION Description Patient/caregiver will verbalize understanding of HTN including medications, diet, exercise, monitoring, and follow up care with min assist.  Outcome: Completed/Met Goal: RH STG INCREASE KNOWLEGDE OF HYPERLIPIDEMIA Description Patient/caregiver will verbalize understanding of HLD including medications, diet, exercise, monitoring, and follow up care with min assist.  Outcome: Completed/Met

## 2018-11-29 NOTE — Progress Notes (Signed)
Dan PA reviewed discharge instructions, provided copy of instructions to patients husband who will be taking care of her at home verbalized understanding no questions regarding discharge instrutions. Patient was taken down via personal wheelchair accompanied by staff.  Patient's equipment and personal belongings sent home with patient. Patient had no c/o pain or shortness of breath at d/c.  Joanny Dupree, Tivis Ringer, RN

## 2018-11-30 ENCOUNTER — Other Ambulatory Visit: Payer: Self-pay

## 2018-11-30 ENCOUNTER — Emergency Department: Payer: Medicaid Other

## 2018-11-30 ENCOUNTER — Emergency Department
Admission: EM | Admit: 2018-11-30 | Discharge: 2018-11-30 | Disposition: A | Discharge status: Home / Self Care | Payer: Medicaid Other | Attending: Emergency Medicine | Admitting: Emergency Medicine

## 2018-11-30 ENCOUNTER — Encounter: Payer: Self-pay | Admitting: Emergency Medicine

## 2018-11-30 ENCOUNTER — Telehealth: Payer: Self-pay | Admitting: Registered Nurse

## 2018-11-30 DIAGNOSIS — W19XXXA Unspecified fall, initial encounter: Secondary | ICD-10-CM

## 2018-11-30 DIAGNOSIS — I5042 Chronic combined systolic (congestive) and diastolic (congestive) heart failure: Secondary | ICD-10-CM | POA: Diagnosis not present

## 2018-11-30 DIAGNOSIS — Z8673 Personal history of transient ischemic attack (TIA), and cerebral infarction without residual deficits: Secondary | ICD-10-CM | POA: Diagnosis not present

## 2018-11-30 DIAGNOSIS — Z7982 Long term (current) use of aspirin: Secondary | ICD-10-CM | POA: Insufficient documentation

## 2018-11-30 DIAGNOSIS — I2510 Atherosclerotic heart disease of native coronary artery without angina pectoris: Secondary | ICD-10-CM | POA: Insufficient documentation

## 2018-11-30 DIAGNOSIS — Z79899 Other long term (current) drug therapy: Secondary | ICD-10-CM | POA: Insufficient documentation

## 2018-11-30 DIAGNOSIS — W010XXA Fall on same level from slipping, tripping and stumbling without subsequent striking against object, initial encounter: Secondary | ICD-10-CM | POA: Diagnosis not present

## 2018-11-30 DIAGNOSIS — I110 Hypertensive heart disease with heart failure: Secondary | ICD-10-CM | POA: Diagnosis not present

## 2018-11-30 DIAGNOSIS — R531 Weakness: Secondary | ICD-10-CM | POA: Diagnosis not present

## 2018-11-30 LAB — BASIC METABOLIC PANEL
Anion gap: 9 (ref 5–15)
BUN: 12 mg/dL (ref 8–23)
CO2: 25 mmol/L (ref 22–32)
Calcium: 9.6 mg/dL (ref 8.9–10.3)
Chloride: 105 mmol/L (ref 98–111)
Creatinine, Ser: 0.77 mg/dL (ref 0.44–1.00)
GFR calc Af Amer: >60 mL/min (ref >60)
GFR calc non Af Amer: >60 mL/min (ref >60)
Glucose, Bld: 101 mg/dL — ABNORMAL HIGH (ref 70–99)
Potassium: 3.9 mmol/L (ref 3.5–5.1)
Sodium: 139 mmol/L (ref 135–145)

## 2018-11-30 LAB — CBC
HCT: 25.7 % — ABNORMAL LOW (ref 36.0–46.0)
Hemoglobin: 8.3 g/dL — ABNORMAL LOW (ref 12.0–15.0)
MCH: 30.3 pg (ref 26.0–34.0)
MCHC: 32.3 g/dL (ref 30.0–36.0)
MCV: 93.8 fL (ref 80.0–100.0)
Platelets: 145 10*3/uL — ABNORMAL LOW (ref 150–400)
RBC: 2.74 MIL/uL — ABNORMAL LOW (ref 3.87–5.11)
RDW: 16.3 % — ABNORMAL HIGH (ref 11.5–15.5)
WBC: 7.6 10*3/uL (ref 4.0–10.5)
nRBC: 0 % (ref 0.0–0.2)

## 2018-11-30 MED ORDER — SODIUM CHLORIDE 0.9 % IV BOLUS
500.0000 mL | Freq: Once | INTRAVENOUS | Status: AC
  Administered 2018-11-30: 500 mL via INTRAVENOUS

## 2018-11-30 MED ORDER — SODIUM CHLORIDE 0.9% FLUSH: 3.0000 mL | Freq: Once | INTRAVENOUS | Status: DC

## 2018-11-30 NOTE — ED Notes (Signed)
Husband updated on plan of care. Pt to be discharged and he will pick her up.

## 2018-11-30 NOTE — ED Provider Notes (Addendum)
[facility] Emergency Department Provider Note  ____________________________________________   I have reviewed the triage vital signs and the nursing notes. Where available I have reviewed prior notes and, if possible and indicated, outside hospital notes.    HISTORY  Chief Complaint Weakness    HPI Destiny [name] is a 63 y.o. female who has a history of CAD CHF CVA, who was discharged yesterday from rehab facility and is still trying to learn how to navigate at home.  Family states that she was try to get in the car and instead of getting him on her left side and then her right side like she was trying to do she tried to sit and spin and instead slid down onto the floor of the car.  She did not hit her head she not pass out she states this is exactly what happened her husband, whom I spoke to on the phone, states is exactly what happened.  Patient seen and evaluated during the coronavirus epidemic during a time with low staffing patient has had no pain or complaints she feels to be at her baseline she just she states that slipped.  Is the family's impression as well.  She states she does have adequate home care.  Would like to get her home soon as possible "everything is okay from the fall".    Past Medical History:  Diagnosis Date  . CAD (coronary artery disease)   . CHF (congestive heart failure) ([place])   . CVA (cerebral infarction)   . Female bladder prolapse   . GERD (gastroesophageal reflux disease)   . Hyperlipidemia   . Hypertension   . Hypokalemia   . Mitral valve disease   . Ovarian ca ([place])   . Sleep apnea   . Stroke [facility])    x3    Patient Active Problem List   Diagnosis Date Noted  . Acute blood loss anemia   . Chronic systolic congestive heart failure ([place])   . Embolic stroke ([place]) 63/87/5643  . 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 Thrombocytopenia ([place])   . Cerebrovascular accident (CVA) ([place])   . HLD (hyperlipidemia) 11/02/2018   . GERD (gastroesophageal reflux disease) 11/02/2018  . CAD (coronary artery disease) 11/02/2018  . Sleep apnea 11/02/2018  . Hypotension 11/02/2018  . Somnolence 11/02/2018  . Chronic diastolic heart failure ([place]) 11/15/2017  . HTN (hypertension) 11/15/2017  . Hypokalemia 11/05/2017  . Chest pain 02/14/2016  . Small bowel obstruction ([place]) 06/16/2015    Past Surgical History:  Procedure Laterality Date  . CARPAL TUNNEL RELEASE Bilateral   . CATARACT EXTRACTION Right 11/22/2017  . CHOLECYSTECTOMY    . COLON SURGERY    . TENNIS ELBOW RELEASE/NIRSCHEL PROCEDURE Left   . TONSILLECTOMY      Prior to Admission medications   Medication Sig Start Date End Date Taking? Authorizing Provider  acetaminophen (TYLENOL) 325 MG tablet Take 1-2 tablets (325-650 mg total) by mouth every 4 (four) hours as needed for mild pain. 11/28/18   Angiulli, Lavon Paganini, PA-C  aspirin 81 MG chewable tablet Chew 81 mg by mouth daily.    [provider]  Cholecalciferol (VITAMIN D3) 125 MCG (5000 UT) CAPS Take 1 capsule (5,000 Units total) by mouth daily. 11/28/18   Angiulli, Lavon Paganini, PA-C  clonazePAM (KLONOPIN) 0.5 MG tablet Take 1 tablet (0.5 mg total) by mouth at bedtime as needed for anxiety. 11/28/18   Angiulli, Lavon Paganini, PA-C  clopidogrel (PLAVIX) 75 MG tablet Take  1 tablet (75 mg total) by mouth daily. 11/28/18   Angiulli, Lavon Paganini, PA-C  Docusate Calcium (STOOL SOFTENER PO) Take 100 mg by mouth daily.     [provider]  famotidine (PEPCID) 20 MG tablet Take 1 tablet (20 mg total) by mouth 2 (two) times daily. 11/28/18   Angiulli, Lavon Paganini, PA-C  fluticasone (FLONASE) 50 MCG/ACT nasal spray Place 2 sprays into both nostrils daily as needed for allergies or rhinitis.    [provider]  Melatonin 3 MG TABS Take 3 mg by mouth at bedtime.    [provider]  multivitamin-lutein (OCUVITE-LUTEIN) CAPS capsule Take 1 capsule by mouth daily. 11/10/18   Gladstone Lighter, MD   nitroGLYCERIN (NITROSTAT) 0.4 MG SL tablet Place 0.4 mg under the tongue every 5 (five) minutes as needed for chest pain.    [provider]  oxybutynin (DITROPAN) 5 MG tablet Take 1 tablet (5 mg total) by mouth 2 (two) times daily. 11/28/18   Angiulli, Lavon Paganini, PA-C  PARoxetine (PAXIL) 30 MG tablet Take 1 tablet (30 mg total) by mouth daily. 11/28/18   Angiulli, Lavon Paganini, PA-C  potassium chloride SA (K-DUR) 20 MEQ tablet Take 1 tablet (20 mEq total) by mouth daily. 11/28/18   Angiulli, Lavon Paganini, PA-C  pregabalin (LYRICA) 150 MG capsule Take 1 capsule (150 mg total) by mouth at bedtime. 11/28/18   Angiulli, Lavon Paganini, PA-C  pregabalin (LYRICA) 75 MG capsule Take 1 capsule (75 mg total) by mouth 2 (two) times daily. 11/28/18   Angiulli, Lavon Paganini, PA-C  rosuvastatin (CRESTOR) 20 MG tablet Take 1 tablet (20 mg total) by mouth at bedtime. 11/28/18   Angiulli, Lavon Paganini, PA-C  senna (SENOKOT) 8.6 MG TABS tablet Take 2 tablets by mouth at bedtime as needed for mild constipation.    [provider]  spironolactone (ALDACTONE) 25 MG tablet Take 0.5 tablets (12.5 mg total) by mouth daily. 11/28/18 11/28/19  Angiulli, Lavon Paganini, PA-C  venlafaxine XR (EFFEXOR-XR) 75 MG 24 hr capsule Take 1 capsule (75 mg total) by mouth daily with breakfast. 11/28/18   Angiulli, Lavon Paganini, PA-C    Allergies Atorvastatin; Fluconazole; Sertraline; Sulfa antibiotics; Sulfamethoxazole-trimethoprim; and Sulfasalazine  Family History  Problem Relation Age of Onset  . Heart disease Other   . Hypertension Other   . Rheum arthritis Mother   . CAD Father     Social History Social History   Tobacco Use  . Smoking status: Never Smoker  . Smokeless tobacco: Never Used  Substance Use Topics  . Alcohol use: No    Alcohol/week: 0.0 standard drinks  . Drug use: No    Review of Systems Constitutional: No fever/chills Eyes: No visual changes. ENT: No sore throat. No stiff neck no neck pain Cardiovascular: Denies  chest pain. Respiratory: Denies shortness of breath. Gastrointestinal:   no vomiting.  No diarrhea.  No constipation. Genitourinary: Negative for dysuria. Musculoskeletal: Negative lower extremity swelling Skin: Negative for rash. Neurological: Negative for severe headaches, focal weakness or numbness.   ____________________________________________   PHYSICAL EXAM:  VITAL SIGNS: ED Triage Vitals  Enc Vitals Group     BP 11/30/18 1405 105/81     Pulse Rate 11/30/18 1405 (!) 110     Resp 11/30/18 1405 16     Temp 11/30/18 1405 98.8 F (37.1 C)     Temp Source 11/30/18 1405 Oral     SpO2 11/30/18 1405 96 %     Weight --  Height --      Head Circumference --      Peak Flow --      Pain Score 11/30/18 1406 0     Pain Loc --      Pain Edu? --      Excl. in [place]? --     Constitutional: Alert and oriented. Well appearing and in no acute distress. Eyes: Conjunctivae are normal Head: Atraumatic HEENT: No congestion/rhinnorhea. Mucous membranes are moist.  Oropharynx non-erythematous Neck:   Nontender with no meningismus, no masses, no stridor Cardiovascular: Normal rate, regular rhythm. Grossly normal heart sounds.  Good peripheral circulation. Respiratory: Normal respiratory effort.  No retractions. Lungs CTAB. Abdominal: Soft and nontender. No distention. No guarding no rebound Back:  There is no focal tenderness or step off.  there is no midline tenderness there are no lesions noted. there is no CVA tenderness Musculoskeletal: No lower extremity tenderness, no upper extremity tenderness. No joint effusions, no DVT signs strong distal pulses no edema Neurologic:  Normal speech and language.  He is strength about 3-5 generally skin:  Skin is warm, dry and intact. No rash noted. Psychiatric: Mood and affect are normal. Speech and behavior are normal.  ____________________________________________   LABS (all labs ordered are listed, but only abnormal results are  displayed)  Labs Reviewed  BASIC METABOLIC PANEL - Abnormal; Notable for the following components:      Result Value   Glucose, Bld 101 (*)    All other components within normal limits  CBC - Abnormal; Notable for the following components:   RBC 2.74 (*)    Hemoglobin 8.3 (*)    HCT 25.7 (*)    RDW 16.3 (*)    Platelets 145 (*)    All other components within normal limits  URINALYSIS, COMPLETE (UACMP) WITH MICROSCOPIC  CBG MONITORING, ED    Pertinent labs  results that were available during my care of the patient were reviewed by me and considered in my medical decision making (see chart for details). ____________________________________________  EKG  I personally interpreted any EKGs ordered by me or triage Sinus tach rate 112, no acute ST elevation or depression no acute ischemic changes ____________________________________________  RADIOLOGY  Pertinent labs & imaging results that were available during my care of the patient were reviewed by me and considered in my medical decision making (see chart for details). If possible, patient and/or family made aware of any abnormal findings.  No results found. ____________________________________________    PROCEDURES  Procedure(s) performed: None  Procedures  Critical Care performed: None  ____________________________________________   INITIAL IMPRESSION / ASSESSMENT AND PLAN / ED COURSE  Pertinent labs & imaging results that were available during my care of the patient were reviewed by me and considered in my medical decision making (see chart for details).  No evidence of acute trauma from this event, hips are nontender with full range of motion, no evidence of closed head injury she is mentating at her baseline she and family both state that she did not hit her head.  She is just trying to get used to getting around after rehab and had an unfortunate event.  Her husband is very concerned about keeping her in the  emergency room for a long period of time given the prevalence of the coronavirus, which is certainly not unreasonable concern.  We are I think at this time going to forestall future imaging as I do not see any clear evidence of traumatic injury and we  will assess the blood work we have already drawn if that looks okay is my hope that we can get her safely home.  Blood pressure 112/73, pulse 96, temperature 98.8 F (37.1 C), temperature source Oral, resp. rate (!) 21, SpO2 97 %. ----------------------------------------- 6:52 PM on 11/30/2018 -----------------------------------------  Seen in no acute distress, she has no evidence of breathing difficulty her vital signs are reassuring and she would like to go home at this time.  If chest x-ray ordered as a screening exam is negative we will try to get her home.     ____________________________________________   FINAL CLINICAL IMPRESSION(S) / ED DIAGNOSES  Final diagnoses:  None      This chart was dictated using voice recognition software.  Despite best efforts to proofread,  errors can occur which can change meaning.      Schuyler Amor, MD 11/30/18 1621    Schuyler Amor, MD 11/30/18 5956    Schuyler Amor, MD 11/30/18 [phone]

## 2018-11-30 NOTE — Telephone Encounter (Signed)
Transitional Care call placed, spoke with Destiny [name]. He reports Destiny [name] was getting into the car loss her balanced and fell. He called EMS, at this time she is being evaluated at [facility] We will follow up on Monday, he verbalizes understanding.

## 2018-11-30 NOTE — ED Triage Notes (Signed)
Pt to ED via [facility] from home for weakness. Per EMS report, pt was getting into the car with family and slipped, falling into the car. Pt has hx/o CVA with deficit to the left side. Pt reports feeling weaker than normal, family reports that she is currently at her base line mental status. Pt has abdominal surgery approximately 2 months ago for total hysterectomy due to ovarian cancer. Pt is currently in NAD.

## 2018-11-30 NOTE — ED Notes (Signed)
Per pt family she is at her baseline

## 2018-12-03 ENCOUNTER — Telehealth: Payer: Self-pay | Admitting: Registered Nurse

## 2018-12-03 DIAGNOSIS — D61818 Other pancytopenia: Secondary | ICD-10-CM

## 2018-12-03 NOTE — Discharge Summary (Signed)
Physician Discharge Summary  Patient ID: Destiny [name] MRN: [id number] DOB/AGE: 1955/09/30 63 y.o.  Admit date: 11/09/2018 Discharge date: 11/29/2018  Discharge Diagnoses:  Principal Problem:   Embolic stroke [facility]) Active Problems:   Hypokalemia   HTN (hypertension)   Thrombocytopenia (HCC)   Acute blood loss anemia   Chronic systolic congestive heart failure [facility])   Discharged Condition:  Stable   Significant Diagnostic Studies:   Labs:  Basic Metabolic Panel: Recent Labs  Lab 11/26/18 0705 11/30/18 1415  NA 140 139  K 4.0 3.9  CL 103 105  CO2 24 25  GLUCOSE 98 101*  BUN 8 12  CREATININE 0.77 0.77  CALCIUM 9.5 9.6    CBC: CBC Latest Ref Rng & Units 11/30/2018 11/26/2018 11/19/2018  WBC 4.0 - 10.5 K/uL 7.6 6.7 6.1  Hemoglobin 12.0 - 15.0 g/dL 8.3(L) 8.0(L) 8.8(L)  Hematocrit 36.0 - 46.0 % 25.7(L) 24.9(L) 27.9(L)  Platelets 150 - 400 K/uL 145(L) 189 383    CBG: No results for input(s): GLUCAP in the last 168 hours.  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  She was admitted to [facility] on 416/20 with somnolence and found to be hypotensive, dehydrated and was significantly hypokalemic.  She was started on fluids and treated for sepsis however on 4/19 she developed LUE numbness progressing to weakness with facial droop.  CT head was negative.  MRI brain showed multiple small infarcts in right corona radiata, subcortical right parietal lobe and left cerebellum.   Neurology recommended aspirin/Plavix once thrombocytopenia improved. Heme-onc was consulted due to progressive thrombocytopenia with platelets down to 21,000.  HIT panel was negative and thrombocytopenia felt to have been a side effect of recent chemotherapy.  He  recommended that Plavix could be added once platelets greater than 100,000.  Patient was started on ASA at discharge as platelets > 60,000. She  continued to be limited by left-sided weakness, left inattention and difficulty following multistep commands.  CIR was recommended due to functional decline   Hospital Course: Destiny [name] was admitted to rehab 11/09/2018 for inpatient therapies to consist of PT, ST and OT at least three hours five days a week. Past admission physiatrist, therapy team and rehab RN have worked together to provide customized collaborative inpatient rehab.  SCDs were used for DVT prophylaxis.  CBC was monitored with serial checks and hemoglobin has ranged from 8.0 to 8.8 without signs of bleeding.  On 4/26 platelets were noted to be greater than 100,000 therefore Plavix was resumed.  She will need repeat CBC for monitoring on DAPT after discharge.  Follow-up check of lytes showed that renal status is stable and hypokalemia has resolved.  Nutritional supplements were ordered to help with low calorie malnutrition.    Abdominal wound is clean dry and intact scabbing at the edges. Mood has been stable on home regimen.  Weights were monitored daily and have been relatively stable without signs of fluid overload off of Demadex.  She was maintained on 1800 cc fluid restriction and remains on his spironolactone and Crestor.  Blood pressures have been stable.  Her progress has been limited by left inattention and decrease in endurance. Her goals were downgraded and she is currently at min assist level. She will continue to receive follow up  home health PT, OT, speech therapy, RN, aide and SW by [facility] past discharge.    Rehab course: During patient's stay in rehab weekly team conferences were held to monitor patient's progress, set goals and discuss barriers to discharge. At admission, patient required assistance with mobility and basic self-care tasks. She exhibited mild to  moderate cognitive impairments with flat affect.  She was tolerating dysphagia 3 diet thin liquid no overt signs or symptoms of aspiration. She  has had improvement in activity tolerance, balance, postural control as well as ability to compensate for deficits. She  was able to complete ADL tasks with min assist and increased time for sequencing and problem-solving.  She requires min/CGA for transfers and to ambulate 53' with RW and verbal cues for safety.  Her cognition is improved and she is able to demonstrate selective attention 45 minutes in a mildly distracting environment.  She currently requires supervision for most tasks and has been felt that patient was at baseline.  Family education has been completed regarding all aspects of care and safety.   Disposition: Home  Diet: Heart healthy  Special Instructions: 1.  Repeat CBC in 1 to 2 weeks past discharge.  Discharge Instructions    Ambulatory referral to Physical Medicine Rehab   Complete by:  As directed    Moderate complexity follow-up 1 to 2 weeks embolic CVA     Allergies as of 11/29/2018      Reactions   Atorvastatin Hives   No s/sx of anaphylaxis, just intermittent hives.  Does not seem to be a class effect as she has tolerated lovastatin previously No s/sx of anaphylaxis, just intermittent hives.  Does not seem to be a class effect as she has tolerated lovastatin previously   Fluconazole Rash   Reaction to generic but not name brand Reaction to generic but not name brand CAN USE DIFLUCAN.DOES NOT BREAK OUT WITH DIFLUCAN.(allergic only to generic brand)   Sertraline Itching   Sulfa Antibiotics Rash, Other (See Comments)   "hot " sensation   Sulfamethoxazole-trimethoprim Rash, Other (See Comments)   "felt hot" Other reaction(s): Other (See Comments) "felt hot"   Sulfasalazine Other (See Comments), Rash   "hot " sensation      Medication List    STOP taking these medications   albuterol (2.5 MG/3ML) 0.083% nebulizer  solution Commonly known as:  PROVENTIL   metoprolol succinate 25 MG 24 hr tablet Commonly known as:  TOPROL-XL   traZODone 50 MG tablet Commonly known as:  DESYREL     TAKE these medications   acetaminophen 325 MG tablet Commonly known as:  TYLENOL Take 1-2 tablets (325-650 mg total) by mouth every 4 (four) hours as needed for mild pain. What changed:    medication strength  how much to take  when to take this  reasons to take this   aspirin 81 MG chewable tablet Chew 81 mg by mouth daily.   clonazePAM 0.5 MG tablet Commonly known as:  KLONOPIN Take 1 tablet (0.5 mg total) by mouth at bedtime as needed for anxiety.   clopidogrel 75 MG tablet Commonly known as:  PLAVIX Take 1 tablet (75 mg total) by mouth daily.   famotidine 20 MG tablet Commonly known as:  PEPCID Take 1 tablet (20 mg total) by mouth 2 (two) times daily.   fluticasone 50 MCG/ACT nasal spray Commonly known as:  FLONASE Place 2 sprays into both nostrils daily as needed for allergies or rhinitis.   Melatonin 3  MG Tabs Take 3 mg by mouth at bedtime.   multivitamin-lutein Caps capsule Take 1 capsule by mouth daily.   nitroGLYCERIN 0.4 MG SL tablet Commonly known as:  NITROSTAT Place 0.4 mg under the tongue every 5 (five) minutes as needed for chest pain.   oxybutynin 5 MG tablet Commonly known as:  DITROPAN Take 1 tablet (5 mg total) by mouth 2 (two) times daily.   PARoxetine 30 MG tablet Commonly known as:  PAXIL Take 1 tablet (30 mg total) by mouth daily.   potassium chloride SA 20 MEQ tablet Commonly known as:  K-DUR Take 1 tablet (20 mEq total) by mouth daily.   pregabalin 75 MG capsule Commonly known as:  LYRICA Take 1 capsule (75 mg total) by mouth 2 (two) times daily. What changed:    how much to take  when to take this  additional instructions   pregabalin 150 MG capsule Commonly known as:  LYRICA Take 1 capsule (150 mg total) by mouth at bedtime. What changed:  You  were already taking a medication with the same name, and this prescription was added. Make sure you understand how and when to take each.   rosuvastatin 20 MG tablet Commonly known as:  CRESTOR Take 1 tablet (20 mg total) by mouth at bedtime.   senna 8.6 MG Tabs tablet Commonly known as:  SENOKOT Take 2 tablets by mouth at bedtime as needed for mild constipation.   spironolactone 25 MG tablet Commonly known as:  ALDACTONE Take 0.5 tablets (12.5 mg total) by mouth daily.   STOOL SOFTENER PO Take 100 mg by mouth daily.   venlafaxine XR 75 MG 24 hr capsule Commonly known as:  EFFEXOR-XR Take 1 capsule (75 mg total) by mouth daily with breakfast.   Vitamin D3 125 MCG (5000 UT) Caps Take 1 capsule (5,000 Units total) by mouth daily.      Follow-up Information    [name], MD Follow up.   Specialty:  Family Medicine Contact information: [address] [address] Diaz, FNP Follow up on 12/21/2018.   Specialty:  Family Medicine Why:  Appointment @ 10:40 AM Contact information: [address] [place] [address] [phone]           Signed: Bary Leriche 12/04/2018, 6:16 PM

## 2018-12-03 NOTE — Telephone Encounter (Signed)
Transitional Care call Transitional Questions answered by Mr. Hollinsworth  Patient name: Destiny [name] DOB: 12-Jul-1956 1. Are you/is patient experiencing any problems since coming home? She was getting into the car on Friday 11/30/2018, she lost her balanced and landed on the edge of the seat her husband states. She was taken to [facility] to be evaluated. Her husband states she did not fall.  a. Are there any questions regarding any aspect of care? No 2. Are there any questions regarding medications administration/dosing? No a. Are meds being taken as prescribed? Yes b. "Patient should review meds with caller to confirm" Medication List Reviewed 3. Have there been any falls? No 4. Has Home Health been to the house and/or have they contacted you? Yes, [place] a. If not, have you tried to contact them? NA b. Can we help you contact them? NA 5. Are bowels and bladder emptying properly? Yes a. Are there any unexpected incontinence issues? No b. If applicable, is patient following bowel/bladder programs? NA 6. Any fevers, problems with breathing, unexpected pain? No 7. Are there any skin problems or new areas of breakdown? No 8. Has the patient/family member arranged specialty MD follow up (ie cardiology/neurology/renal/surgical/etc.)?  Yes, Mr. Vanessa Ralphs reports he ha to call Mrs. [place] John PCP for [place] appointment. He will be calling today.  a. Can we help arrange? No 9. Does the patient need any other services or support that we can help arrange? No 10. Are caregivers following through as expected in assisting the patient? Yes 11. Has the patient quit smoking, drinking alcohol, or using drugs as recommended? Mr. Romero reports Mrs. Feimster doesn't smoke, drink alcohol or use illicit drugs.   Appointment date/time 12/13/2018  arrival time 11:00 for 11:20 appointment with Bayard Hugger ANP-C. At [place]

## 2018-12-07 ENCOUNTER — Inpatient Hospital Stay
Admission: EM | Admit: 2018-12-07 | Discharge: 2018-12-13 | DRG: 193 | Disposition: A | Discharge status: Skilled Nursing Facility | Payer: Medicaid Other | Attending: Internal Medicine | Admitting: Internal Medicine

## 2018-12-07 ENCOUNTER — Encounter: Payer: Self-pay | Admitting: Emergency Medicine

## 2018-12-07 ENCOUNTER — Emergency Department: Payer: Medicaid Other

## 2018-12-07 ENCOUNTER — Other Ambulatory Visit: Payer: Self-pay

## 2018-12-07 DIAGNOSIS — Z20828 Contact with and (suspected) exposure to other viral communicable diseases: Secondary | ICD-10-CM | POA: Diagnosis present

## 2018-12-07 DIAGNOSIS — Z8249 Family history of ischemic heart disease and other diseases of the circulatory system: Secondary | ICD-10-CM

## 2018-12-07 DIAGNOSIS — D638 Anemia in other chronic diseases classified elsewhere: Secondary | ICD-10-CM | POA: Diagnosis present

## 2018-12-07 DIAGNOSIS — Z8261 Family history of arthritis: Secondary | ICD-10-CM | POA: Diagnosis not present

## 2018-12-07 DIAGNOSIS — T8189XA Other complications of procedures, not elsewhere classified, initial encounter: Secondary | ICD-10-CM | POA: Diagnosis present

## 2018-12-07 DIAGNOSIS — Z7902 Long term (current) use of antithrombotics/antiplatelets: Secondary | ICD-10-CM | POA: Diagnosis not present

## 2018-12-07 DIAGNOSIS — Z8543 Personal history of malignant neoplasm of ovary: Secondary | ICD-10-CM | POA: Diagnosis not present

## 2018-12-07 DIAGNOSIS — G9341 Metabolic encephalopathy: Secondary | ICD-10-CM | POA: Diagnosis present

## 2018-12-07 DIAGNOSIS — Z8673 Personal history of transient ischemic attack (TIA), and cerebral infarction without residual deficits: Secondary | ICD-10-CM | POA: Diagnosis not present

## 2018-12-07 DIAGNOSIS — Z7982 Long term (current) use of aspirin: Secondary | ICD-10-CM | POA: Diagnosis not present

## 2018-12-07 DIAGNOSIS — N8110 Cystocele, unspecified: Secondary | ICD-10-CM | POA: Diagnosis present

## 2018-12-07 DIAGNOSIS — B961 Klebsiella pneumoniae [K. pneumoniae] as the cause of diseases classified elsewhere: Secondary | ICD-10-CM | POA: Diagnosis present

## 2018-12-07 DIAGNOSIS — E785 Hyperlipidemia, unspecified: Secondary | ICD-10-CM | POA: Diagnosis present

## 2018-12-07 DIAGNOSIS — G4730 Sleep apnea, unspecified: Secondary | ICD-10-CM | POA: Diagnosis present

## 2018-12-07 DIAGNOSIS — N3001 Acute cystitis with hematuria: Secondary | ICD-10-CM | POA: Diagnosis present

## 2018-12-07 DIAGNOSIS — I059 Rheumatic mitral valve disease, unspecified: Secondary | ICD-10-CM | POA: Diagnosis present

## 2018-12-07 DIAGNOSIS — I110 Hypertensive heart disease with heart failure: Secondary | ICD-10-CM | POA: Diagnosis present

## 2018-12-07 DIAGNOSIS — Y95 Nosocomial condition: Secondary | ICD-10-CM | POA: Diagnosis present

## 2018-12-07 DIAGNOSIS — I5032 Chronic diastolic (congestive) heart failure: Secondary | ICD-10-CM | POA: Diagnosis present

## 2018-12-07 DIAGNOSIS — G9340 Encephalopathy, unspecified: Secondary | ICD-10-CM

## 2018-12-07 DIAGNOSIS — I2510 Atherosclerotic heart disease of native coronary artery without angina pectoris: Secondary | ICD-10-CM | POA: Diagnosis present

## 2018-12-07 DIAGNOSIS — K219 Gastro-esophageal reflux disease without esophagitis: Secondary | ICD-10-CM | POA: Diagnosis present

## 2018-12-07 DIAGNOSIS — J189 Pneumonia, unspecified organism: Secondary | ICD-10-CM | POA: Diagnosis present

## 2018-12-07 DIAGNOSIS — E876 Hypokalemia: Secondary | ICD-10-CM | POA: Diagnosis present

## 2018-12-07 DIAGNOSIS — F329 Major depressive disorder, single episode, unspecified: Secondary | ICD-10-CM | POA: Diagnosis present

## 2018-12-07 LAB — COMPREHENSIVE METABOLIC PANEL
ALT: 28 U/L (ref 0–44)
AST: 33 U/L (ref 15–41)
Albumin: 3.4 g/dL — ABNORMAL LOW (ref 3.5–5.0)
Alkaline Phosphatase: 65 U/L (ref 38–126)
Anion gap: 9 (ref 5–15)
BUN: 8 mg/dL (ref 8–23)
CO2: 22 mmol/L (ref 22–32)
Calcium: 9.4 mg/dL (ref 8.9–10.3)
Chloride: 108 mmol/L (ref 98–111)
Creatinine, Ser: 0.7 mg/dL (ref 0.44–1.00)
GFR calc Af Amer: >60 mL/min (ref >60)
GFR calc non Af Amer: >60 mL/min (ref >60)
Glucose, Bld: 112 mg/dL — ABNORMAL HIGH (ref 70–99)
Potassium: 3.7 mmol/L (ref 3.5–5.1)
Sodium: 139 mmol/L (ref 135–145)
Total Bilirubin: 0.5 mg/dL (ref 0.3–1.2)
Total Protein: 7.5 g/dL (ref 6.5–8.1)

## 2018-12-07 LAB — CBC WITH DIFFERENTIAL/PLATELET
Abs Immature Granulocytes: 0.09 10*3/uL — ABNORMAL HIGH (ref 0.00–0.07)
Basophils Absolute: 0.1 10*3/uL (ref 0.0–0.1)
Basophils Relative: 1 %
Eosinophils Absolute: 0.3 10*3/uL (ref 0.0–0.5)
Eosinophils Relative: 4 %
HCT: 27 % — ABNORMAL LOW (ref 36.0–46.0)
Hemoglobin: 8.4 g/dL — ABNORMAL LOW (ref 12.0–15.0)
Immature Granulocytes: 1 %
Lymphocytes Relative: 14 %
Lymphs Abs: 1.3 10*3/uL (ref 0.7–4.0)
MCH: 30.4 pg (ref 26.0–34.0)
MCHC: 31.1 g/dL (ref 30.0–36.0)
MCV: 97.8 fL (ref 80.0–100.0)
Monocytes Absolute: 1 10*3/uL (ref 0.1–1.0)
Monocytes Relative: 12 %
Neutro Abs: 6 10*3/uL (ref 1.7–7.7)
Neutrophils Relative %: 68 %
Platelets: 180 10*3/uL (ref 150–400)
RBC: 2.76 MIL/uL — ABNORMAL LOW (ref 3.87–5.11)
RDW: 16.9 % — ABNORMAL HIGH (ref 11.5–15.5)
WBC: 8.7 10*3/uL (ref 4.0–10.5)
nRBC: 0 % (ref 0.0–0.2)

## 2018-12-07 LAB — URINALYSIS, COMPLETE (UACMP) WITH MICROSCOPIC
Bilirubin Urine: NEGATIVE
Glucose, UA: NEGATIVE mg/dL
Hgb urine dipstick: NEGATIVE
Ketones, ur: NEGATIVE mg/dL
Nitrite: NEGATIVE
Protein, ur: 30 mg/dL — AB
Specific Gravity, Urine: 1.015 (ref 1.005–1.030)
WBC, UA: >50 WBC/hpf — ABNORMAL HIGH (ref 0–5)
pH: 5 (ref 5.0–8.0)

## 2018-12-07 LAB — BLOOD GAS, VENOUS

## 2018-12-07 LAB — TROPONIN I: Troponin I: <0.03 ng/mL (ref <0.03)

## 2018-12-07 LAB — GLUCOSE, CAPILLARY: Glucose-Capillary: 91 mg/dL (ref 70–99)

## 2018-12-07 LAB — SARS CORONAVIRUS 2 BY RT PCR (HOSPITAL ORDER, PERFORMED IN ~~LOC~~ HOSPITAL LAB): SARS Coronavirus 2: NEGATIVE

## 2018-12-07 LAB — LACTIC ACID, PLASMA: Lactic Acid, Venous: 1.6 mmol/L (ref 0.5–1.9)

## 2018-12-07 MED ORDER — SENNOSIDES-DOCUSATE SODIUM 8.6-50 MG PO TABS: 1.0000 | ORAL_TABLET | Freq: Every evening | ORAL | Status: DC | PRN

## 2018-12-07 MED ORDER — HYDROCODONE-ACETAMINOPHEN 5-325 MG PO TABS: 1.0000 | ORAL_TABLET | ORAL | Status: DC | PRN

## 2018-12-07 MED ORDER — DOCUSATE SODIUM 100 MG PO CAPS
100.0000 mg | ORAL_CAPSULE | Freq: Every day | ORAL | Status: DC
  Administered 2018-12-08 – 2018-12-10 (×3): 100 mg via ORAL
  Filled 2018-12-07 (×4): qty 1

## 2018-12-07 MED ORDER — VANCOMYCIN HCL IN DEXTROSE 1-5 GM/200ML-% IV SOLN
1000.0000 mg | Freq: Once | INTRAVENOUS | Status: AC
  Administered 2018-12-07: 18:00:00 1000 mg via INTRAVENOUS
  Filled 2018-12-07: qty 200

## 2018-12-07 MED ORDER — SODIUM CHLORIDE 0.9 % IV SOLN
2.0000 g | Freq: Three times a day (TID) | INTRAVENOUS | Status: DC
  Administered 2018-12-07 – 2018-12-09 (×5): 2 g via INTRAVENOUS
  Filled 2018-12-07 (×6): qty 2

## 2018-12-07 MED ORDER — PREGABALIN 75 MG PO CAPS
75.0000 mg | ORAL_CAPSULE | Freq: Two times a day (BID) | ORAL | Status: DC
  Administered 2018-12-08 – 2018-12-13 (×10): 75 mg via ORAL
  Filled 2018-12-07 (×10): qty 1

## 2018-12-07 MED ORDER — ENOXAPARIN SODIUM 40 MG/0.4ML ~~LOC~~ SOLN
40.0000 mg | SUBCUTANEOUS | Status: DC
  Administered 2018-12-07 – 2018-12-12 (×6): 40 mg via SUBCUTANEOUS
  Filled 2018-12-07 (×6): qty 0.4

## 2018-12-07 MED ORDER — ONDANSETRON HCL 4 MG/2ML IJ SOLN: 4.0000 mg | Freq: Four times a day (QID) | INTRAMUSCULAR | Status: DC | PRN

## 2018-12-07 MED ORDER — OCUVITE-LUTEIN PO CAPS
1.0000 | ORAL_CAPSULE | Freq: Every day | ORAL | Status: DC
  Administered 2018-12-08 – 2018-12-13 (×6): 1 via ORAL
  Filled 2018-12-07 (×6): qty 1

## 2018-12-07 MED ORDER — SODIUM CHLORIDE 0.9 % IV SOLN
INTRAVENOUS | Status: DC | PRN
  Administered 2018-12-07 – 2018-12-09 (×3): 250 mL via INTRAVENOUS

## 2018-12-07 MED ORDER — FAMOTIDINE 20 MG PO TABS
20.0000 mg | ORAL_TABLET | Freq: Two times a day (BID) | ORAL | Status: DC
  Administered 2018-12-08 – 2018-12-13 (×11): 20 mg via ORAL
  Filled 2018-12-07 (×11): qty 1

## 2018-12-07 MED ORDER — SODIUM CHLORIDE 0.9 % IV BOLUS
500.0000 mL | Freq: Once | INTRAVENOUS | Status: AC
  Administered 2018-12-07: 11:00:00 500 mL via INTRAVENOUS

## 2018-12-07 MED ORDER — VENLAFAXINE HCL ER 75 MG PO CP24
75.0000 mg | ORAL_CAPSULE | Freq: Every day | ORAL | Status: DC
  Administered 2018-12-08 – 2018-12-11 (×4): 75 mg via ORAL
  Filled 2018-12-07 (×4): qty 1

## 2018-12-07 MED ORDER — MELATONIN 5 MG PO TABS
5.0000 mg | ORAL_TABLET | Freq: Every day | ORAL | Status: DC
  Administered 2018-12-08 – 2018-12-12 (×3): 5 mg via ORAL
  Filled 2018-12-07 (×7): qty 1

## 2018-12-07 MED ORDER — FLUTICASONE PROPIONATE 50 MCG/ACT NA SUSP
2.0000 | Freq: Every day | NASAL | Status: DC | PRN
  Filled 2018-12-07: qty 16

## 2018-12-07 MED ORDER — NITROGLYCERIN 0.4 MG SL SUBL: 0.4000 mg | SUBLINGUAL_TABLET | SUBLINGUAL | Status: DC | PRN

## 2018-12-07 MED ORDER — ONDANSETRON HCL 4 MG PO TABS: 4.0000 mg | ORAL_TABLET | Freq: Four times a day (QID) | ORAL | Status: DC | PRN

## 2018-12-07 MED ORDER — PREGABALIN 75 MG PO CAPS
150.0000 mg | ORAL_CAPSULE | Freq: Every day | ORAL | Status: DC
  Administered 2018-12-08 – 2018-12-12 (×5): 150 mg via ORAL
  Filled 2018-12-07 (×5): qty 2

## 2018-12-07 MED ORDER — VITAMIN D 25 MCG (1000 UNIT) PO TABS
5000.0000 [IU] | ORAL_TABLET | Freq: Every day | ORAL | Status: DC
  Administered 2018-12-08 – 2018-12-13 (×6): 5000 [IU] via ORAL
  Filled 2018-12-07 (×6): qty 5

## 2018-12-07 MED ORDER — CLOPIDOGREL BISULFATE 75 MG PO TABS
75.0000 mg | ORAL_TABLET | Freq: Every day | ORAL | Status: DC
  Administered 2018-12-08 – 2018-12-13 (×6): 75 mg via ORAL
  Filled 2018-12-07 (×6): qty 1

## 2018-12-07 MED ORDER — ASPIRIN 81 MG PO CHEW
81.0000 mg | CHEWABLE_TABLET | Freq: Every day | ORAL | Status: DC
  Administered 2018-12-08 – 2018-12-13 (×6): 81 mg via ORAL
  Filled 2018-12-07 (×6): qty 1

## 2018-12-07 MED ORDER — ACETAMINOPHEN 325 MG PO TABS: 650.0000 mg | ORAL_TABLET | Freq: Four times a day (QID) | ORAL | Status: DC | PRN

## 2018-12-07 MED ORDER — PAROXETINE HCL 30 MG PO TABS
30.0000 mg | ORAL_TABLET | Freq: Every day | ORAL | Status: DC
  Administered 2018-12-08 – 2018-12-13 (×6): 30 mg via ORAL
  Filled 2018-12-07 (×6): qty 1

## 2018-12-07 MED ORDER — VANCOMYCIN HCL 10 G IV SOLR
1250.0000 mg | INTRAVENOUS | Status: DC
  Administered 2018-12-08: 18:00:00 1250 mg via INTRAVENOUS
  Filled 2018-12-07: qty 1250

## 2018-12-07 MED ORDER — ACETAMINOPHEN 650 MG RE SUPP: 650.0000 mg | Freq: Four times a day (QID) | RECTAL | Status: DC | PRN

## 2018-12-07 MED ORDER — SODIUM CHLORIDE 0.9 % IV SOLN
1.0000 g | Freq: Once | INTRAVENOUS | Status: AC
  Administered 2018-12-07: 12:00:00 1 g via INTRAVENOUS
  Filled 2018-12-07: qty 1

## 2018-12-07 MED ORDER — POTASSIUM CHLORIDE CRYS ER 20 MEQ PO TBCR
20.0000 meq | EXTENDED_RELEASE_TABLET | Freq: Every day | ORAL | Status: DC
  Administered 2018-12-07 – 2018-12-13 (×6): 20 meq via ORAL
  Filled 2018-12-07 (×7): qty 1

## 2018-12-07 MED ORDER — VANCOMYCIN HCL IN DEXTROSE 1-5 GM/200ML-% IV SOLN
1000.0000 mg | Freq: Once | INTRAVENOUS | Status: AC
  Administered 2018-12-07: 12:00:00 1000 mg via INTRAVENOUS
  Filled 2018-12-07: qty 200

## 2018-12-07 MED ORDER — CLONAZEPAM 0.5 MG PO TABS: 0.5000 mg | ORAL_TABLET | Freq: Every evening | ORAL | Status: DC | PRN

## 2018-12-07 MED ORDER — ALBUTEROL SULFATE (2.5 MG/3ML) 0.083% IN NEBU: 2.5000 mg | INHALATION_SOLUTION | RESPIRATORY_TRACT | Status: DC | PRN

## 2018-12-07 MED ORDER — SODIUM CHLORIDE 0.9 % IV SOLN
INTRAVENOUS | Status: DC
  Administered 2018-12-07 – 2018-12-08 (×2): via INTRAVENOUS

## 2018-12-07 MED ORDER — SPIRONOLACTONE 25 MG PO TABS
12.5000 mg | ORAL_TABLET | Freq: Every day | ORAL | Status: DC
  Administered 2018-12-08 – 2018-12-13 (×6): 12.5 mg via ORAL
  Filled 2018-12-07: qty 0.5
  Filled 2018-12-07 (×3): qty 1
  Filled 2018-12-07: qty 0.5
  Filled 2018-12-07: qty 1
  Filled 2018-12-07 (×2): qty 0.5
  Filled 2018-12-07: qty 1
  Filled 2018-12-07 (×2): qty 0.5
  Filled 2018-12-07: qty 1

## 2018-12-07 MED ORDER — BISACODYL 5 MG PO TBEC
5.0000 mg | DELAYED_RELEASE_TABLET | Freq: Every day | ORAL | Status: DC | PRN
  Administered 2018-12-10: 5 mg via ORAL
  Filled 2018-12-07: qty 1

## 2018-12-07 MED ORDER — OXYBUTYNIN CHLORIDE 5 MG PO TABS
5.0000 mg | ORAL_TABLET | Freq: Two times a day (BID) | ORAL | Status: DC
  Administered 2018-12-08 – 2018-12-13 (×11): 5 mg via ORAL
  Filled 2018-12-07 (×11): qty 1

## 2018-12-07 MED ORDER — ROSUVASTATIN CALCIUM 10 MG PO TABS
20.0000 mg | ORAL_TABLET | Freq: Every day | ORAL | Status: DC
  Administered 2018-12-08 – 2018-12-12 (×5): 20 mg via ORAL
  Filled 2018-12-07 (×5): qty 2

## 2018-12-07 NOTE — ED Notes (Signed)
Called and updated husband again.

## 2018-12-07 NOTE — ED Provider Notes (Signed)
[facility] Emergency Department Provider Note  ____________________________________________  Time seen: Approximately 10:57 AM  I have reviewed the triage vital signs and the nursing notes.   HISTORY  Chief Complaint Fever  Level 5 caveat:  Portions of the history and physical were unable to be obtained due to somnolent patient   HPI Destiny [name] is a 63 y.o. female with a history of CAD, CHF, CVA on Plavix and asa, hypertension, hyperlipidemia, ovarian cancer s/p resection complicated by bowel perforation who presents for evaluation of weakness.  EMS was called to the house to assist in lifting patient up from the recliner.  When they arrived to the house patient was warm to the touch and very weak which prompted them to bring her over to the emergency room.  Patient denies any pain including headache, chest pain, abdominal pain, back pain.  She denies fevers at home, cough, shortness of breath, nausea, vomiting, diarrhea.  She does endorse dysuria for a few days.   Patient does not have a fever on arrival to the emergency room. Patient is very somnolent and falls asleep during questions. According to husband patient has had a fever for the last few days.  Past Medical History:  Diagnosis Date  . CAD (coronary artery disease)   . CHF (congestive heart failure) ([place])   . CVA (cerebral infarction)   . Female bladder prolapse   . GERD (gastroesophageal reflux disease)   . Hyperlipidemia   . Hypertension   . Hypokalemia   . Mitral valve disease   . Ovarian ca ([place])   . Sleep apnea   . Stroke [facility])    x3    Patient Active Problem List   Diagnosis Date Noted  . Acute blood loss anemia   . Chronic systolic congestive heart failure ([place])   . Embolic stroke ([place]) 62/13/0865  . 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 Thrombocytopenia ([place])   . Cerebrovascular accident (CVA) ([place])   . HLD (hyperlipidemia) 11/02/2018  . GERD (gastroesophageal  reflux disease) 11/02/2018  . CAD (coronary artery disease) 11/02/2018  . Sleep apnea 11/02/2018  . Hypotension 11/02/2018  . Somnolence 11/02/2018  . Chronic diastolic heart failure ([place]) 11/15/2017  . HTN (hypertension) 11/15/2017  . Hypokalemia 11/05/2017  . Chest pain 02/14/2016  . Small bowel obstruction ([place]) 06/16/2015    Past Surgical History:  Procedure Laterality Date  . CARPAL TUNNEL RELEASE Bilateral   . CATARACT EXTRACTION Right 11/22/2017  . CHOLECYSTECTOMY    . COLON SURGERY    . TENNIS ELBOW RELEASE/NIRSCHEL PROCEDURE Left   . TONSILLECTOMY      Prior to Admission medications   Medication Sig Start Date End Date Taking? Authorizing Provider  acetaminophen (TYLENOL) 325 MG tablet Take 1-2 tablets (325-650 mg total) by mouth every 4 (four) hours as needed for mild pain. 11/28/18   Angiulli, Lavon Paganini, PA-C  aspirin 81 MG chewable tablet Chew 81 mg by mouth daily.    [provider]  Cholecalciferol (VITAMIN D3) 125 MCG (5000 UT) CAPS Take 1 capsule (5,000 Units total) by mouth daily. 11/28/18   Angiulli, Lavon Paganini, PA-C  clonazePAM (KLONOPIN) 0.5 MG tablet Take 1 tablet (0.5 mg total) by mouth at bedtime as needed for anxiety. 11/28/18   Angiulli, Lavon Paganini, PA-C  clopidogrel (PLAVIX) 75 MG tablet Take 1 tablet (75 mg total) by mouth daily. 11/28/18   Angiulli, Lavon Paganini, PA-C  Docusate Calcium (STOOL SOFTENER PO) Take 100 mg by  mouth daily.     [provider]  famotidine (PEPCID) 20 MG tablet Take 1 tablet (20 mg total) by mouth 2 (two) times daily. 11/28/18   Angiulli, Lavon Paganini, PA-C  fluticasone (FLONASE) 50 MCG/ACT nasal spray Place 2 sprays into both nostrils daily as needed for allergies or rhinitis.    [provider]  Melatonin 3 MG TABS Take 3 mg by mouth at bedtime.    [provider]  multivitamin-lutein (OCUVITE-LUTEIN) CAPS capsule Take 1 capsule by mouth daily. 11/10/18   Gladstone Lighter, MD  nitroGLYCERIN (NITROSTAT) 0.4 MG  SL tablet Place 0.4 mg under the tongue every 5 (five) minutes as needed for chest pain.    [provider]  oxybutynin (DITROPAN) 5 MG tablet Take 1 tablet (5 mg total) by mouth 2 (two) times daily. 11/28/18   Angiulli, Lavon Paganini, PA-C  PARoxetine (PAXIL) 30 MG tablet Take 1 tablet (30 mg total) by mouth daily. 11/28/18   Angiulli, Lavon Paganini, PA-C  potassium chloride SA (K-DUR) 20 MEQ tablet Take 1 tablet (20 mEq total) by mouth daily. 11/28/18   Angiulli, Lavon Paganini, PA-C  pregabalin (LYRICA) 150 MG capsule Take 1 capsule (150 mg total) by mouth at bedtime. 11/28/18   Angiulli, Lavon Paganini, PA-C  pregabalin (LYRICA) 75 MG capsule Take 1 capsule (75 mg total) by mouth 2 (two) times daily. 11/28/18   Angiulli, Lavon Paganini, PA-C  rosuvastatin (CRESTOR) 20 MG tablet Take 1 tablet (20 mg total) by mouth at bedtime. 11/28/18   Angiulli, Lavon Paganini, PA-C  senna (SENOKOT) 8.6 MG TABS tablet Take 2 tablets by mouth at bedtime as needed for mild constipation.    [provider]  spironolactone (ALDACTONE) 25 MG tablet Take 0.5 tablets (12.5 mg total) by mouth daily. 11/28/18 11/28/19  Angiulli, Lavon Paganini, PA-C  venlafaxine XR (EFFEXOR-XR) 75 MG 24 hr capsule Take 1 capsule (75 mg total) by mouth daily with breakfast. 11/28/18   Angiulli, Lavon Paganini, PA-C    Allergies Atorvastatin; Fluconazole; Sertraline; Sulfa antibiotics; Sulfamethoxazole-trimethoprim; and Sulfasalazine  Family History  Problem Relation Age of Onset  . Heart disease Other   . Hypertension Other   . Rheum arthritis Mother   . CAD Father     Social History Social History   Tobacco Use  . Smoking status: Never Smoker  . Smokeless tobacco: Never Used  Substance Use Topics  . Alcohol use: No    Alcohol/week: 0.0 standard drinks  . Drug use: No    Review of Systems  Constitutional: + fever and weakness Eyes: Negative for visual changes. ENT: Negative for sore throat. Neck: No neck pain  Cardiovascular: Negative for chest pain.  Respiratory: Negative for shortness of breath. Gastrointestinal: Negative for abdominal pain, vomiting or diarrhea.  Genitourinary: + dysuria. Musculoskeletal: Negative for back pain. Skin: Negative for rash. Neurological: Negative for headaches, weakness or numbness. Psych: No SI or HI  ____________________________________________   PHYSICAL EXAM:  VITAL SIGNS: ED Triage Vitals  Enc Vitals Group     BP --      Pulse Rate 12/07/18 1042 (!) 115     Resp 12/07/18 1042 (!) 21     Temp --      Temp src --      SpO2 12/07/18 1042 99 %     Weight 12/07/18 1038 194 lb 7.5 oz (88.2 kg)     Height 12/07/18 1038 5\' 2"  (1.575 m)     Head Circumference --  Peak Flow --      Pain Score --      Pain Loc --      Pain Edu? --      Excl. in [place]? --     Constitutional: Very somnolent, but easily arousable, oriented x 3, falls asleep during questions. Well appearing and in no apparent distress. HEENT:      Head: Normocephalic and atraumatic.         Eyes: Conjunctivae are normal. Sclera is non-icteric.       Mouth/Throat: Mucous membranes are moist.       Neck: Supple with no signs of meningismus. Cardiovascular: Tachycardic with regular rhythm. No murmurs, gallops, or rubs. 2+ symmetrical distal pulses are present in all extremities. No JVD. Respiratory: Normal respiratory effort, tachypnea. Lungs are clear to auscultation bilaterally. No wheezes, crackles, or rhonchi.  Gastrointestinal: Soft, non tender, and non distended with positive bowel sounds. No rebound or guarding. Musculoskeletal: Nontender with normal range of motion in all extremities. No edema, cyanosis, or erythema of extremities. Neurologic: Normal speech and language. Face is symmetric. Moving all extremities.  Skin: Skin is warm, dry and intact. No rash noted. Psychiatric: Mood and affect are normal. Speech and behavior are normal.  ____________________________________________   LABS (all labs ordered are listed,  but only abnormal results are displayed)  Labs Reviewed  COMPREHENSIVE METABOLIC PANEL - Abnormal; Notable for the following components:      Result Value   Glucose, Bld 112 (*)    Albumin 3.4 (*)    All other components within normal limits  URINALYSIS, COMPLETE (UACMP) WITH MICROSCOPIC - Abnormal; Notable for the following components:   Color, Urine YELLOW (*)    APPearance CLOUDY (*)    Protein, ur 30 (*)    Leukocytes,Ua LARGE (*)    WBC, UA >50 (*)    Bacteria, UA RARE (*)    Non Squamous Epithelial PRESENT (*)    All other components within normal limits  CBC WITH DIFFERENTIAL/PLATELET - Abnormal; Notable for the following components:   RBC 2.76 (*)    Hemoglobin 8.4 (*)    HCT 27.0 (*)    RDW 16.9 (*)    Abs Immature Granulocytes 0.09 (*)    All other components within normal limits  SARS CORONAVIRUS 2 (HOSPITAL ORDER, [facility])  CULTURE, BLOOD (ROUTINE X 2)  CULTURE, BLOOD (ROUTINE X 2)  TROPONIN I  LACTIC ACID, PLASMA  BLOOD GAS, VENOUS   ____________________________________________  EKG  ED ECG REPORT I, Rudene Re, the attending physician, personally viewed and interpreted this ECG.  Sinus tachycardia, rate of 109, normal intervals, normal axis, no ST elevations or depressions ____________________________________________  RADIOLOGY  I have personally reviewed the images performed during this visit and I agree with the Radiologist's read.   Interpretation by Radiologist:  Dg Chest Portable 1 View  Result Date: 12/07/2018 CLINICAL DATA:  Cognitive impairment.  Weakness. EXAM: PORTABLE CHEST 1 VIEW COMPARISON:  One-view chest x-ray 11/30/2018 FINDINGS: Heart size is normal. Asymmetric right lower lobe airspace disease is present. Small right pleural effusion is present. There is no edema. Visualized soft tissues and bony thorax are unremarkable. IMPRESSION: Increased right basilar airspace disease and pleural effusion  concerning for infection. Electronically Signed   By: San Morelle M.D.   On: 12/07/2018 11:27    ____________________________________________   PROCEDURES  Procedure(s) performed: None Procedures Critical Care performed:  None ____________________________________________   INITIAL IMPRESSION / ASSESSMENT AND PLAN /  ED COURSE  63 y.o. female with a history of CAD, CHF, CVA on Plavix and asa, hypertension, hyperlipidemia, ovarian cancer s/p resection complicated by bowel perforation who presents for evaluation of weakness.  EMS was called to the house to help with a lift assist from her recliner.  EMS was concerned because she felt hot to the touch and they thought she was weak, patient is also very somnolent.  Per husband has had low grade fever the last few days. She is complaining of mild dysuria.  Upon arrival to the emergency room she is very somnolent but arousable however she does fall asleep easily during questioning, oriented x3.  She is afebrile.  Slightly tachycardic and tachypneic.  Will check labs and CXR to rule out dehydration, AKI, UTI, PNA.  Will monitor patient on telemetry.    _________________________ 1:08 PM on 12/07/2018 -----------------------------------------  UA positive for UTI.  Chest x-ray positive for right lower lobe pneumonia.  Normal white count, normal lactic with no signs of sepsis.  Patient was given cefepime and Vanco for H CAP due to recent admission to the hospital.  Due to patient's generalized weakness and encephalopathy she will be admitted to the hospitalist service.   As part of my medical decision making, I reviewed the following data within the [place] notes reviewed and incorporated, Labs reviewed , EKG interpreted , Old chart reviewed, Radiograph reviewed , Discussed with admitting physician , Notes from prior ED visits and [place] Controlled Substance Database    Pertinent labs & imaging results that were  available during my care of the patient were reviewed by me and considered in my medical decision making (see chart for details).    ____________________________________________   FINAL CLINICAL IMPRESSION(S) / ED DIAGNOSES  Final diagnoses:  HCAP (healthcare-associated pneumonia)  Encephalopathy acute  Acute cystitis with hematuria      NEW MEDICATIONS STARTED DURING THIS VISIT:  ED Discharge Orders    None       Note:  This document was prepared using Dragon voice recognition software and may include unintentional dictation errors.    Alfred Levins, [place], MD 12/07/18 1310

## 2018-12-07 NOTE — ED Notes (Addendum)
Family called EMS because they had trouble getting her out of the chair. Per EMS had axillary temperature so family decided to send patient to get her checked.  Pt is at baseline mental status per report

## 2018-12-07 NOTE — Progress Notes (Signed)
Rapid Response team called to 2c- room 204 during bedside shift report due to concerns of possible stroke.  Patient able to report name correctly, and date of birth (after asking and re-arousing 4-5 times), but began to repeat "May 22, May 22, May 22, May 22."  Patient demonstrated ability to follow basic command. Her baseline left sided deficits from hx CVA unchanged compared to RN admitting assessment. Although, when asked please repeat after me, "the cat jumped out of the bag," the patient starred forward and responded with "May 22, May 22, May 22, May 22." Nurses notified charge nurse and called rapid response. Blood glucose spot check 91.     12/07/18 1942  Vitals  Temp 98.6 F (37 C)  Temp Source Oral  BP (!) 120/93  MAP (mmHg) 102  BP Location Left Arm  BP Method Automatic  Patient Position (if appropriate) Lying  Pulse Rate (!) 107  Pulse Rate Source Monitor  Oxygen Therapy  SpO2 100 %  O2 Device Room Air  MEWS Score  MEWS RR 0  MEWS Pulse 1  MEWS Systolic 0  MEWS LOC 0  MEWS Temp 0  MEWS Score 1  MEWS Score Color Green

## 2018-12-07 NOTE — ED Notes (Signed)
ED TO INPATIENT HANDOFF REPORT  ED Nurse Name and Phone #: Mateo Flow [phone]  S Name/Age/Gender Destiny [name] 63 y.o. female Room/Bed: ED16A/ED16A  Code Status   Code Status: Prior  Home/SNF/Other Home Patient oriented to: self, place, time and situation Is this baseline? Yes   Triage Complete: Triage complete  Chief Complaint infection  Triage Note Family unable to get pt up from chair today. Pt has some cognitive impairment from previous stroke per EMS.  Pt will open eyes to voice.  Is oriented to person, place and time.   Allergies Allergies  Allergen Reactions  . Atorvastatin Hives    No s/sx of anaphylaxis, just intermittent hives.  Does not seem to be a class effect as she has tolerated lovastatin previously No s/sx of anaphylaxis, just intermittent hives.  Does not seem to be a class effect as she has tolerated lovastatin previously  . Fluconazole Rash    Reaction to generic but not name brand Reaction to generic but not name brand CAN USE DIFLUCAN.DOES NOT BREAK OUT WITH DIFLUCAN.(allergic only to generic brand)  . Sertraline Itching  . Sulfa Antibiotics Rash and Other (See Comments)    "hot " sensation  . Sulfamethoxazole-Trimethoprim Rash and Other (See Comments)    "felt hot" Other reaction(s): Other (See Comments) "felt hot"  . Sulfasalazine Other (See Comments) and Rash    "hot " sensation    Level of Care/Admitting Diagnosis ED Disposition    ED Disposition Condition [facility] Area: [place] [[id number]]  Level of Care: Med-Surg [16]  Covid Evaluation: N/A  Diagnosis: HAP (hospital-acquired pneumonia) [315400]  Admitting Physician: Demetrios Loll [[id number]]  Attending Physician: Demetrios Loll [[id number]]  Estimated length of stay: past midnight tomorrow  Certification:: I certify this patient will need inpatient services for at least 2 midnights  PT Class (Do Not Modify): Inpatient [101]  PT Acc Code (Do Not Modify):  Private [1]       B Medical/Surgery History Past Medical History:  Diagnosis Date  . CAD (coronary artery disease)   . CHF (congestive heart failure) ([place])   . CVA (cerebral infarction)   . Female bladder prolapse   . GERD (gastroesophageal reflux disease)   . Hyperlipidemia   . Hypertension   . Hypokalemia   . Mitral valve disease   . Ovarian ca ([place])   . Sleep apnea   . Stroke [facility])    x3   Past Surgical History:  Procedure Laterality Date  . CARPAL TUNNEL RELEASE Bilateral   . CATARACT EXTRACTION Right 11/22/2017  . CHOLECYSTECTOMY    . COLON SURGERY    . TENNIS ELBOW RELEASE/NIRSCHEL PROCEDURE Left   . TONSILLECTOMY       A IV Location/Drains/Wounds Patient Lines/Drains/Airways Status   Active Line/Drains/Airways    Name:   Placement date:   Placement time:   Site:   Days:   Implanted Port Right Chest   -    -    Chest      Peripheral IV 12/07/18 Left Hand   12/07/18    1148    Hand   less than 1   Peripheral IV 12/07/18 Right Antecubital   12/07/18    1150    Antecubital   less than 1   External Urinary Catheter   12/07/18    1145    -   less than 1          Intake/Output Last 24 hours No  intake or output data in the 24 hours ending 12/07/18 1429  Labs/Imaging Results for orders placed or performed during the hospital encounter of 12/07/18 (from the past 48 hour(s))  Comprehensive metabolic panel     Status: Abnormal   Collection Time: 12/07/18 10:58 AM  Result Value Ref Range   Sodium 139 135 - 145 mmol/L   Potassium 3.7 3.5 - 5.1 mmol/L   Chloride 108 98 - 111 mmol/L   CO2 22 22 - 32 mmol/L   Glucose, Bld 112 (H) 70 - 99 mg/dL   BUN 8 8 - 23 mg/dL   Creatinine, Ser 0.70 0.44 - 1.00 mg/dL   Calcium 9.4 8.9 - 10.3 mg/dL   Total Protein 7.5 6.5 - 8.1 g/dL   Albumin 3.4 (L) 3.5 - 5.0 g/dL   AST 33 15 - 41 U/L   ALT 28 0 - 44 U/L   Alkaline Phosphatase 65 38 - 126 U/L   Total Bilirubin 0.5 0.3 - 1.2 mg/dL   GFR calc non Af Amer >60 >60 mL/min    GFR calc Af Amer >60 >60 mL/min   Anion gap 9 5 - 15    Comment: Performed at [facility], [address] [place], [place] [address]  Urinalysis, Complete w Microscopic     Status: Abnormal   Collection Time: 12/07/18 10:58 AM  Result Value Ref Range   Color, Urine YELLOW (A) YELLOW   APPearance CLOUDY (A) CLEAR   Specific Gravity, Urine 1.015 1.005 - 1.030   pH 5.0 5.0 - 8.0   Glucose, UA NEGATIVE NEGATIVE mg/dL   Hgb urine dipstick NEGATIVE NEGATIVE   Bilirubin Urine NEGATIVE NEGATIVE   Ketones, ur NEGATIVE NEGATIVE mg/dL   Protein, ur 30 (A) NEGATIVE mg/dL   Nitrite NEGATIVE NEGATIVE   Leukocytes,Ua LARGE (A) NEGATIVE   RBC / HPF 6-10 0 - 5 RBC/hpf   WBC, UA >50 (H) 0 - 5 WBC/hpf   Bacteria, UA RARE (A) NONE SEEN   Squamous Epithelial / LPF 0-5 0 - 5   WBC Clumps PRESENT    Mucus PRESENT    Non Squamous Epithelial PRESENT (A) NONE SEEN    Comment: Performed at [facility], [address] [place], [place] [address]  Troponin I - ONCE - STAT     Status: None   Collection Time: 12/07/18 10:58 AM  Result Value Ref Range   Troponin I <0.03 <0.03 ng/mL    Comment: Performed at [facility], [address] [place], [place] [address]  CBC with Differential/Platelet     Status: Abnormal   Collection Time: 12/07/18 10:58 AM  Result Value Ref Range   WBC 8.7 4.0 - 10.5 K/uL   RBC 2.76 (L) 3.87 - 5.11 MIL/uL   Hemoglobin 8.4 (L) 12.0 - 15.0 g/dL   HCT 27.0 (L) 36.0 - 46.0 %   MCV 97.8 80.0 - 100.0 fL   MCH 30.4 26.0 - 34.0 pg   MCHC 31.1 30.0 - 36.0 g/dL   RDW 16.9 (H) 11.5 - 15.5 %   Platelets 180 150 - 400 K/uL   nRBC 0.0 0.0 - 0.2 %   Neutrophils Relative % 68 %   Neutro Abs 6.0 1.7 - 7.7 K/uL   Lymphocytes Relative 14 %   Lymphs Abs 1.3 0.7 - 4.0 K/uL   Monocytes Relative 12 %   Monocytes Absolute 1.0 0.1 - 1.0 K/uL   Eosinophils Relative 4 %   Eosinophils Absolute 0.3 0.0 -  0.5 K/uL   Basophils Relative 1 %   Basophils Absolute 0.1  0.0 - 0.1 K/uL   Immature Granulocytes 1 %   Abs Immature Granulocytes 0.09 (H) 0.00 - 0.07 K/uL    Comment: Performed at [facility], [address] [place], [place] [address]  Lactic acid, plasma     Status: None   Collection Time: 12/07/18 10:58 AM  Result Value Ref Range   Lactic Acid, Venous 1.6 0.5 - 1.9 mmol/L    Comment: Performed at [facility], [address] [place], [place] [address]  SARS Coronavirus 2 (CEPHEID- Performed in [facility] hospital lab), Hosp Order     Status: None   Collection Time: 12/07/18 11:53 AM  Result Value Ref Range   SARS Coronavirus 2 NEGATIVE NEGATIVE    Comment: (NOTE) If result is NEGATIVE SARS-CoV-2 target nucleic acids are NOT DETECTED. The SARS-CoV-2 RNA is generally detectable in upper and lower  respiratory specimens during the acute phase of infection. The lowest  concentration of SARS-CoV-2 viral copies this assay can detect is 250  copies / mL. A negative result does not preclude SARS-CoV-2 infection  and should not be used as the sole basis for treatment or other  patient management decisions.  A negative result may occur with  improper specimen collection / handling, submission of specimen other  than nasopharyngeal swab, presence of viral mutation(s) within the  areas targeted by this assay, and inadequate number of viral copies  (<250 copies / mL). A negative result must be combined with clinical  observations, patient history, and epidemiological information. If result is POSITIVE SARS-CoV-2 target nucleic acids are DETECTED. The SARS-CoV-2 RNA is generally detectable in upper and lower  respiratory specimens dur ing the acute phase of infection.  Positive  results are indicative of active infection with SARS-CoV-2.  Clinical  correlation with patient history and other diagnostic information is  necessary to determine patient infection status.  Positive results do  not rule out bacterial infection or  co-infection with other viruses. If result is PRESUMPTIVE POSTIVE SARS-CoV-2 nucleic acids MAY BE PRESENT.   A presumptive positive result was obtained on the submitted specimen  and confirmed on repeat testing.  While 2019 novel coronavirus  (SARS-CoV-2) nucleic acids may be present in the submitted sample  additional confirmatory testing may be necessary for epidemiological  and / or clinical management purposes  to differentiate between  SARS-CoV-2 and other Sarbecovirus currently known to infect humans.  If clinically indicated additional testing with an alternate test  methodology [phone]) is advised. The SARS-CoV-2 RNA is generally  detectable in upper and lower respiratory sp ecimens during the acute  phase of infection. The expected result is Negative. Fact Sheet for Patients:  StrictlyIdeas.no Fact Sheet for Healthcare Providers: BankingDealers.co.za This test is not yet approved or cleared by the [place] FDA and has been authorized for detection and/or diagnosis of SARS-CoV-2 by FDA under an Emergency Use Authorization (EUA).  This EUA will remain in effect (meaning this test can be used) for the duration of the COVID-19 declaration under Section 564(b)(1) of the Act, 21 U.S.C. section 360bbb-3(b)(1), unless the authorization is terminated or revoked sooner. Performed at [facility], [address] [place], [place] [address]   Blood gas, venous     Status: None (Preliminary result)   Collection Time: 12/07/18 11:56 AM  Result Value Ref Range   pH, Ven 7.33 7.250 - 7.430   pCO2, Ven 51 44.0 -  60.0 mmHg   pO2, Ven PENDING 32.0 - 45.0 mmHg   Bicarbonate 26.9 20.0 - 28.0 mmol/L   Acid-Base Excess 0.2 0.0 - 2.0 mmol/L   O2 Saturation 18.5 %   Patient temperature 37.0    Collection site VENOUS    Sample type VENOUS     Comment: Performed at [facility], [address] [place], [place] [address]    Dg Chest Portable 1 View  Result Date: 12/07/2018 CLINICAL DATA:  Cognitive impairment.  Weakness. EXAM: PORTABLE CHEST 1 VIEW COMPARISON:  One-view chest x-ray 11/30/2018 FINDINGS: Heart size is normal. Asymmetric right lower lobe airspace disease is present. Small right pleural effusion is present. There is no edema. Visualized soft tissues and bony thorax are unremarkable. IMPRESSION: Increased right basilar airspace disease and pleural effusion concerning for infection. Electronically Signed   By: San Morelle M.D.   On: 12/07/2018 11:27    Pending Labs Unresulted Labs (From admission, onward)    Start     Ordered   12/07/18 1428  MRSA PCR Screening  ONCE - STAT,   STAT     12/07/18 1428   12/07/18 1311  Urine Culture  Add-on,   AD     12/07/18 1310   12/07/18 1133  Blood culture (routine x 2)  BLOOD CULTURE X 2,   STAT     12/07/18 1132   Signed and Held  Basic metabolic panel  Tomorrow morning,   R     Signed and Held   Signed and Held  CBC  Tomorrow morning,   R     Signed and Held   Signed and Held  Creatinine, serum  (enoxaparin (LOVENOX)    CrCl >/= 30 ml/min)  Weekly,   R    Comments:  while on enoxaparin therapy    Signed and Held   Signed and Held  Culture, sputum-assessment  Once,   R     Signed and Held   Signed and Held  Gram stain  Once,   R     Signed and Held   Signed and Held  Strep pneumoniae urinary antigen  Once,   R     Signed and Held          Vitals/Pain Today's Vitals   12/07/18 1200 12/07/18 1300 12/07/18 1330 12/07/18 1400  BP: 124/80 (!) 132/93 117/88 (!) 123/91  Pulse: (!) 104 (!) 113 (!) 116 (!) 112  Resp: 20 20 (!) 22 18  Temp:      TempSrc:      SpO2: 100% 99% 100% 100%  Weight:      Height:        Isolation Precautions Droplet and Contact precautions  Medications Medications  vancomycin (VANCOCIN) IVPB 1000 mg/200 mL premix (has no administration in time range)  ceFEPIme (MAXIPIME) 2 g in sodium chloride 0.9 % 100 mL  IVPB (has no administration in time range)  vancomycin (VANCOCIN) 1,250 mg in sodium chloride 0.9 % 250 mL IVPB (has no administration in time range)  sodium chloride 0.9 % bolus 500 mL (0 mLs Intravenous Stopped 12/07/18 1209)  ceFEPIme (MAXIPIME) 1 g in sodium chloride 0.9 % 100 mL IVPB (0 g Intravenous Stopped 12/07/18 1226)  vancomycin (VANCOCIN) IVPB 1000 mg/200 mL premix (0 mg Intravenous Stopped 12/07/18 1311)    Mobility Family must help patient get up. Moderate fall risk   Focused Assessments here for fevers at home. admit with PNA. unlabored. pt very drowsy wakes to loud  voice. oriented and able to answer questions if can stay awake long enough. sometimes has difficulty withsome questions   R Recommendations: See Admitting Provider Note  Report given to:   Additional Notes:

## 2018-12-07 NOTE — ED Notes (Signed)
Called and updated husband with patient permission.

## 2018-12-07 NOTE — Progress Notes (Signed)
Advanced Care Plan.  Purpose of Encounter: CODE STATUS. Parties in Attendance: The patient, her husband and me. Patient's Decisional Capacity: No. Medical Story: Destiny [name]  is a 63 y.o. female with a known history of CAD, CHF, CVA, hypertension, hyperlipidemia, ovarian cancer, sleep apnea and GERD.  Patient is being admitted for HAP and UTI.  I discussed with the patient's husband about her current condition, prognosis and CODE STATUS.  The patient husband said the patient's wishes to resuscitation and intubation if she has cardiopulmonary arrest.  Plan:  Code Status: Full code. Time spent discussing advance care planning: 17 minutes.

## 2018-12-07 NOTE — ED Triage Notes (Signed)
Family unable to get pt up from chair today. Pt has some cognitive impairment from previous stroke per EMS.  Pt will open eyes to voice.  Is oriented to person, place and time.

## 2018-12-07 NOTE — H&P (Signed)
[place] [facility] [place] NAME: Destiny [name]    MR#:  [id number]  DATE OF BIRTH:  [birth year]  DATE OF ADMISSION:  12/07/2018  PRIMARY CARE PHYSICIAN: [name], MD   REQUESTING/REFERRING PHYSICIAN:Veronese, Narda Amber, MD  CHIEF COMPLAINT:   Chief Complaint  Patient presents with  . Fever   Fever and confusion HISTORY OF PRESENT ILLNESS:  Destiny [name]  is a 63 y.o. female with a known history of CAD, CHF, CVA, hypertension, hyperlipidemia, ovarian cancer, sleep apnea and GERD.  The patient was recently hospitalized inthe hospital due to acute CVA.  She was discharged to home and has fever and confusion today.  The patient is a poor historian cannot provide any information.  Per ED physician, the patient is found to have UTI and chest x-ray report pneumonia.  She is treated with cefepime and vancomycin in the ED.  Blood culture is sent. PAST MEDICAL HISTORY:   Past Medical History:  Diagnosis Date  . CAD (coronary artery disease)   . CHF (congestive heart failure) ([place])   . CVA (cerebral infarction)   . Female bladder prolapse   . GERD (gastroesophageal reflux disease)   . Hyperlipidemia   . Hypertension   . Hypokalemia   . Mitral valve disease   . Ovarian ca ([place])   . Sleep apnea   . Stroke [facility])    x3    PAST SURGICAL HISTORY:   Past Surgical History:  Procedure Laterality Date  . CARPAL TUNNEL RELEASE Bilateral   . CATARACT EXTRACTION Right 11/22/2017  . CHOLECYSTECTOMY    . COLON SURGERY    . TENNIS ELBOW RELEASE/NIRSCHEL PROCEDURE Left   . TONSILLECTOMY      SOCIAL HISTORY:   Social History   Tobacco Use  . Smoking status: Never Smoker  . Smokeless tobacco: Never Used  Substance Use Topics  . Alcohol use: No    Alcohol/week: 0.0 standard drinks    FAMILY HISTORY:   Family History  Problem Relation Age of Onset  . Heart disease Other   . Hypertension Other   . Rheum arthritis Mother   . CAD Father     DRUG ALLERGIES:   Allergies  Allergen Reactions  . Atorvastatin Hives    No s/sx of anaphylaxis, just intermittent hives.  Does not seem to be a class effect as she has tolerated lovastatin previously No s/sx of anaphylaxis, just intermittent hives.  Does not seem to be a class effect as she has tolerated lovastatin previously  . Fluconazole Rash    Reaction to generic but not name brand Reaction to generic but not name brand CAN USE DIFLUCAN.DOES NOT BREAK OUT WITH DIFLUCAN.(allergic only to generic brand)  . Sertraline Itching  . Sulfa Antibiotics Rash and Other (See Comments)    "hot " sensation  . Sulfamethoxazole-Trimethoprim Rash and Other (See Comments)    "felt hot" Other reaction(s): Other (See Comments) "felt hot"  . Sulfasalazine Other (See Comments) and Rash    "hot " sensation    REVIEW OF SYSTEMS:   Review of Systems  Unable to perform ROS: Mental status change    MEDICATIONS AT HOME:   Prior to Admission medications   Medication Sig Start Date End Date Taking? Authorizing Provider  acetaminophen (TYLENOL) 325 MG tablet Take 1-2 tablets (325-650 mg total) by mouth every 4 (four) hours as needed for mild pain. 11/28/18   Angiulli, Lavon Paganini, PA-C  aspirin 81 MG chewable tablet Chew  81 mg by mouth daily.    [provider]  Cholecalciferol (VITAMIN D3) 125 MCG (5000 UT) CAPS Take 1 capsule (5,000 Units total) by mouth daily. 11/28/18   Angiulli, Lavon Paganini, PA-C  clonazePAM (KLONOPIN) 0.5 MG tablet Take 1 tablet (0.5 mg total) by mouth at bedtime as needed for anxiety. 11/28/18   Angiulli, Lavon Paganini, PA-C  clopidogrel (PLAVIX) 75 MG tablet Take 1 tablet (75 mg total) by mouth daily. 11/28/18   Angiulli, Lavon Paganini, PA-C  Docusate Calcium (STOOL SOFTENER PO) Take 100 mg by mouth daily.     [provider]  famotidine (PEPCID) 20 MG tablet Take 1 tablet (20 mg total) by mouth 2 (two) times daily. 11/28/18   Angiulli, Lavon Paganini, PA-C  fluticasone (FLONASE)  50 MCG/ACT nasal spray Place 2 sprays into both nostrils daily as needed for allergies or rhinitis.    [provider]  Melatonin 3 MG TABS Take 3 mg by mouth at bedtime.    [provider]  multivitamin-lutein (OCUVITE-LUTEIN) CAPS capsule Take 1 capsule by mouth daily. 11/10/18   Gladstone Lighter, MD  nitroGLYCERIN (NITROSTAT) 0.4 MG SL tablet Place 0.4 mg under the tongue every 5 (five) minutes as needed for chest pain.    [provider]  oxybutynin (DITROPAN) 5 MG tablet Take 1 tablet (5 mg total) by mouth 2 (two) times daily. 11/28/18   Angiulli, Lavon Paganini, PA-C  PARoxetine (PAXIL) 30 MG tablet Take 1 tablet (30 mg total) by mouth daily. 11/28/18   Angiulli, Lavon Paganini, PA-C  potassium chloride SA (K-DUR) 20 MEQ tablet Take 1 tablet (20 mEq total) by mouth daily. 11/28/18   Angiulli, Lavon Paganini, PA-C  pregabalin (LYRICA) 150 MG capsule Take 1 capsule (150 mg total) by mouth at bedtime. 11/28/18   Angiulli, Lavon Paganini, PA-C  pregabalin (LYRICA) 75 MG capsule Take 1 capsule (75 mg total) by mouth 2 (two) times daily. 11/28/18   Angiulli, Lavon Paganini, PA-C  rosuvastatin (CRESTOR) 20 MG tablet Take 1 tablet (20 mg total) by mouth at bedtime. 11/28/18   Angiulli, Lavon Paganini, PA-C  senna (SENOKOT) 8.6 MG TABS tablet Take 2 tablets by mouth at bedtime as needed for mild constipation.    [provider]  spironolactone (ALDACTONE) 25 MG tablet Take 0.5 tablets (12.5 mg total) by mouth daily. 11/28/18 11/28/19  Angiulli, Lavon Paganini, PA-C  venlafaxine XR (EFFEXOR-XR) 75 MG 24 hr capsule Take 1 capsule (75 mg total) by mouth daily with breakfast. 11/28/18   Angiulli, Lavon Paganini, PA-C      VITAL SIGNS:  Blood pressure (!) 123/91, pulse (!) 112, temperature 98.3 F (36.8 C), temperature source Rectal, resp. rate 18, height 5\' 2"  (1.575 m), weight 88.2 kg, SpO2 100 %.  PHYSICAL EXAMINATION:  Physical Exam  GENERAL:  63 y.o.-year-old patient lying lying in the bed with no acute distress.   Obesity. EYES: Pupils equal, round, reactive to light and accommodation. No scleral icterus. Extraocular muscles intact.  HEENT: Head atraumatic, normocephalic. NECK:  Supple, no jugular venous distention. No thyroid enlargement, no tenderness.  LUNGS: Normal breath sounds bilaterally, no wheezing, rales,rhonchi or crepitation. No use of accessory muscles of respiration.  CARDIOVASCULAR: S1, S2 normal. No murmurs, rubs, or gallops.  ABDOMEN: Soft, nontender, nondistended. Bowel sounds present. No organomegaly or mass.  EXTREMITIES: No pedal edema, cyanosis, or clubbing.  NEUROLOGIC: Unable to exam. PSYCHIATRIC: The patient is alert and oriented x 2.  SKIN: No obvious rash, lesion, or ulcer.   LABORATORY  PANEL:   CBC Recent Labs  Lab 12/07/18 1058  WBC 8.7  HGB 8.4*  HCT 27.0*  PLT 180   ------------------------------------------------------------------------------------------------------------------  Chemistries  Recent Labs  Lab 12/07/18 1058  NA 139  K 3.7  CL 108  CO2 22  GLUCOSE 112*  BUN 8  CREATININE 0.70  CALCIUM 9.4  AST 33  ALT 28  ALKPHOS 65  BILITOT 0.5   ------------------------------------------------------------------------------------------------------------------  Cardiac Enzymes Recent Labs  Lab 12/07/18 1058  TROPONINI <0.03   ------------------------------------------------------------------------------------------------------------------  RADIOLOGY:  Dg Chest Portable 1 View  Result Date: 12/07/2018 CLINICAL DATA:  Cognitive impairment.  Weakness. EXAM: PORTABLE CHEST 1 VIEW COMPARISON:  One-view chest x-ray 11/30/2018 FINDINGS: Heart size is normal. Asymmetric right lower lobe airspace disease is present. Small right pleural effusion is present. There is no edema. Visualized soft tissues and bony thorax are unremarkable. IMPRESSION: Increased right basilar airspace disease and pleural effusion concerning for infection. Electronically Signed    By: San Morelle M.D.   On: 12/07/2018 11:27      IMPRESSION AND PLAN:   HAP and UTI The patient will be admitted to medical floor. Continue cefepime and vancomycin, follow-up blood CBC and cultures.  Acute metabolic encephalopathy due to above. Aspiration the fall precaution.  Recent acute CVA. Continue aspirin and Plavix and statin.  Hypertension.  Continue home hypertension medication. Hyperlipidemia.  Continue statin. Anemia of chronic disease.  Stable. All the records are reviewed and case discussed with ED provider. Management plans discussed with the patient, her husband and they are in agreement.  CODE STATUS:   TOTAL TIME TAKING CARE OF THIS PATIENT: 43 minutes.    Demetrios Loll M.D on 12/07/2018 at 2:06 PM  Between 7am to 6pm - Pager - [phone]  After 6pm go to [email or web address] - Proofreader  [employer] [facility]  Office  [phone]  CC: Primary care physician; [name], MD   Note: This dictation was prepared with Dragon dictation along with smaller phrase technology. Any transcriptional errors that result from this process are unin

## 2018-12-07 NOTE — Congregational Nurse Program (Signed)
Pharmacy Antibiotic Note  Destiny [name] is a 63 y.o. female admitted on 12/07/2018 with pneumonia.  Pharmacy has been consulted for vancomycin dosing.  Plan: Pt received vancomycin 1g x 1 in the ED. Will order another 1000 mg dose to make a total of 2000 mg. Will order a maintenance dose of 1250 mg q24H with a predicted AUC of 491. Goal AUC is 400-550. Css min 10.3 Scr used 0.7. Plan to order a MRSA PCR if negative recommend to d/c vancomycin. Plan to order vancomycin levels in 4-5 days.   Will continue cefepime 2 g q8H.   Height: 5\' 2"  (157.5 cm) Weight: 194 lb 7.5 oz (88.2 kg) IBW/kg (Calculated) : 50.1  Temp (24hrs), Avg:98.3 F (36.8 C), Min:98.3 F (36.8 C), Max:98.3 F (36.8 C)  Recent Labs  Lab 11/30/18 1415 12/07/18 1058  WBC 7.6 8.7  CREATININE 0.77 0.70  LATICACIDVEN  --  1.6    Estimated Creatinine Clearance: 73.2 mL/min (by C-G formula based on SCr of 0.7 mg/dL).    Allergies  Allergen Reactions  . Atorvastatin Hives    No s/sx of anaphylaxis, just intermittent hives.  Does not seem to be a class effect as she has tolerated lovastatin previously No s/sx of anaphylaxis, just intermittent hives.  Does not seem to be a class effect as she has tolerated lovastatin previously  . Fluconazole Rash    Reaction to generic but not name brand Reaction to generic but not name brand CAN USE DIFLUCAN.DOES NOT BREAK OUT WITH DIFLUCAN.(allergic only to generic brand)  . Sertraline Itching  . Sulfa Antibiotics Rash and Other (See Comments)    "hot " sensation  . Sulfamethoxazole-Trimethoprim Rash and Other (See Comments)    "felt hot" Other reaction(s): Other (See Comments) "felt hot"  . Sulfasalazine Other (See Comments) and Rash    "hot " sensation    Antimicrobials this admission: 5/22 cefepime >>  5/22 vancomycin  >>   Dose adjustments this admission: None   Microbiology results: 5/22 BCx: Pending  5/22 MRSA PCR: ordered  Thank you for allowing pharmacy to  be a part of this patient's care.  Oswald Hillock, PharmD, BCPS Clinical Pharmacist  12/07/2018 2:10 PM

## 2018-12-07 NOTE — TOC Initial Note (Signed)
Transition of Care [facility]) - Initial/Assessment Note    Patient Details  Name: Destiny [name] MRN: [id number] Date of Birth: June 15, 1956  Transition of Care [facility]) CM/SW Contact:    Shelbie Hutching, RN Phone Number: 12/07/2018, 2:50 PM  Clinical Narrative:                 Patient is being admitted with pneumonia.  Patient is very lethargic and will open eyes to voice but goes right back to sleep, patient was able to give RNCM permission to speak with her husband.  Patient lives with her husband in [place].  Husband reports that the patient requires assistance with all ADL's, he reports that she has a walker and can use it for short distances but spends the rest of her time in the wheelchair.  Patient has walker, wheelchair, and bedside commode.  Patient is open with [place], Floydene Flock is aware of admission.  Patient does not currently have insurance, husband reports that they have applied for Medicaid and disability but it is not active yet.  Patient gets her prescriptions from [facility] and the husband has a charge account there, so he has just been charging her medications every month.  Husband thinks that the patient may need rehab at discharge, PT will need to evaluate.    Expected Discharge Plan: [place] Barriers to Discharge: Continued Medical Work up   Patient Goals and CMS Choice Patient states their goals for this hospitalization and ongoing recovery are:: Patient is lethargic, husband reports that he thinks the patient needs a higher level of care than HH.        Expected Discharge Plan and Services Expected Discharge Plan: [place]   Discharge Planning Services: CM Consult   Living arrangements for the past 2 months: Apartment                               Date [place]: 12/07/18 Time [place]: 1430 Representative spoke with at [place]: Philbert Riser of admission)  Prior  Living Arrangements/Services Living arrangements for the past 2 months: [place] with:: Spouse Patient language and need for interpreter reviewed:: No Do you feel safe going back to the place where you live?: Yes      Need for Family Participation in Patient Care: Yes (Comment)(chronically ill) Care giver support system in place?: Yes (comment)(husband and son) Current home services: DME, Home OT, Home PT, Home RN, Other (comment)(wheelchair, walker, bedside commode) Criminal Activity/Legal Involvement Pertinent to Current Situation/Hospitalization: No - Comment as needed  Activities of Daily Living      Permission Sought/Granted Permission sought to share information with : Family Supports Permission granted to share information with : Yes, Verbal Permission Granted     Permission granted to share info w AGENCY: [place] granted to share info w Relationship: Husband      Emotional Assessment Appearance:: Appears older than stated age Attitude/Demeanor/Rapport: Lethargic Affect (typically observed): Unable to Assess Orientation: : Oriented to Self Alcohol / Substance Use: Not Applicable Psych Involvement: No (comment)  Admission diagnosis:  infection Patient Active Problem List   Diagnosis Date Noted  . HAP (hospital-acquired pneumonia) 12/07/2018  . Acute blood loss anemia   . Chronic systolic congestive heart failure ([place])   . Embolic stroke ([place]) 56/43/3295  . 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  Thrombocytopenia ([place])   . Cerebrovascular accident (CVA) ([place])   . HLD (hyperlipidemia) 11/02/2018  . GERD (gastroesophageal reflux disease) 11/02/2018  . CAD (coronary artery disease) 11/02/2018  . Sleep apnea 11/02/2018  . Hypotension 11/02/2018  . Somnolence 11/02/2018  . Chronic diastolic heart failure ([place]) 11/15/2017  . HTN (hypertension) 11/15/2017  . Hypokalemia 11/05/2017  . Chest pain 02/14/2016  . Small bowel obstruction  ([place]) 06/16/2015   PCP:  [name], MD Pharmacy:   [place], [place] - [address] [place] [address] Phone: [phone] Fax: [place], [place] [address] Phone: [phone] Fax: [phone]     Social Determinants of Health (SDOH) Interventions    Readmission Risk Interventions No flowsheet data found.

## 2018-12-07 NOTE — Progress Notes (Signed)
Patient arrives to 2c-204 from ER with admitting dx of pneumonia.  Patient with increased lethargy and AMS at home per nursing report.  Patient slid from stretcher to bed and x3 staff.  Patient alert and oriented x4, but with delayed responses.  Patient supports history of CVA, along with left sided weakness residual as well.  Noted bilateral upper extremity tremors, which patient said "this happens to me sometimes."  Patient also reports it is her birthday today.  Congratulatory and emotional support provided.  Patient oriented to room and admitting orders.  Patient verbalizes understanding. Bed alarm on, call bell within reach. VSS.    12/07/18 1515  Vitals  Temp 97.6 F (36.4 C)  Temp Source Oral  BP 118/85  MAP (mmHg) 97  BP Location Left Arm  BP Method Automatic  Patient Position (if appropriate) Lying  Pulse Rate (!) 108  Resp 18  Oxygen Therapy  SpO2 100 %  O2 Device Room Air  MEWS Score  MEWS RR 0  MEWS Pulse 1  MEWS Systolic 0  MEWS LOC 0  MEWS Temp 0  MEWS Score 1  MEWS Score Color Green

## 2018-12-08 LAB — CBC
HCT: 24.2 % — ABNORMAL LOW (ref 36.0–46.0)
Hemoglobin: 7.5 g/dL — ABNORMAL LOW (ref 12.0–15.0)
MCH: 30.4 pg (ref 26.0–34.0)
MCHC: 31 g/dL (ref 30.0–36.0)
MCV: 98 fL (ref 80.0–100.0)
Platelets: 160 10*3/uL (ref 150–400)
RBC: 2.47 MIL/uL — ABNORMAL LOW (ref 3.87–5.11)
RDW: 16.7 % — ABNORMAL HIGH (ref 11.5–15.5)
WBC: 6.8 10*3/uL (ref 4.0–10.5)
nRBC: 0 % (ref 0.0–0.2)

## 2018-12-08 LAB — BASIC METABOLIC PANEL
Anion gap: 7 (ref 5–15)
BUN: 10 mg/dL (ref 8–23)
CO2: 24 mmol/L (ref 22–32)
Calcium: 8.8 mg/dL — ABNORMAL LOW (ref 8.9–10.3)
Chloride: 110 mmol/L (ref 98–111)
Creatinine, Ser: 0.74 mg/dL (ref 0.44–1.00)
GFR calc Af Amer: >60 mL/min (ref >60)
GFR calc non Af Amer: >60 mL/min (ref >60)
Glucose, Bld: 101 mg/dL — ABNORMAL HIGH (ref 70–99)
Potassium: 3.4 mmol/L — ABNORMAL LOW (ref 3.5–5.1)
Sodium: 141 mmol/L (ref 135–145)

## 2018-12-08 LAB — MAGNESIUM: Magnesium: 1.7 mg/dL (ref 1.7–2.4)

## 2018-12-08 LAB — MRSA PCR SCREENING: MRSA by PCR: POSITIVE — AB

## 2018-12-08 MED ORDER — POTASSIUM CHLORIDE CRYS ER 20 MEQ PO TBCR
40.0000 meq | EXTENDED_RELEASE_TABLET | Freq: Once | ORAL | Status: AC
  Administered 2018-12-08: 14:00:00 40 meq via ORAL
  Filled 2018-12-08: qty 2

## 2018-12-08 NOTE — Evaluation (Signed)
Physical Therapy Evaluation Patient Details Name: Destiny [name] MRN: [id number] DOB: Apr 06, 1956 Today's Date: 12/08/2018   History of Present Illness  Destiny [name] is a 63yo F who comes [facility] on 5[facility]22 c fever, oted to have UTI/PNA. Pt just recently DC from [facility] 4/25-5/13 in [place]. PMH: CAD, CHF, CVA c Left hemiplegia, HTN, HOLD, ovarian CA, OSA, GERD.   Clinical Impression  Pt admitted with above diagnosis. Pt currently with functional limitations due to the deficits listed below (see "PT Problem List"). Upon entry, pt in bed, awake, with flat affect, minimally responsive when spoken to, and when patient responds the content of response is appropriate to topic <50% of the time. The pt is alert and oriented to self, gives her name as a response to many questions. Total Assist to EOB, pt appears to follow multimodal cues and appears to be participatory, but is quite weak and limited in ability to mobilize. Pt rises from EOB with min-modA without assistive device, but cannot take steps for pivot, hence totalA to chair. Pt may pivot with less assistance if using a RW but attempt deferred until patient is more communicative. Functional mobility assessment demonstrates increased effort/time requirements, poor tolerance, and need for physical assistance, whereas the patient performed these at a slightly higher level of independence PTA. Pt was minA for bed mobility and transfers PTA and able to AMB ~18ft with RW, but does have a WC in home. Given that patient just recently left CIR with such a low level of independence, it is not cogent that a short term rehab stay would provide more benefit than services in the home at DC, as patient would likely have more benefit from long-term rehab services to provide a greater period in which to progress. Per RNCM note, husband pt has adequate caregiver assistance at home. Pt will benefit from skilled PT intervention to increase independence and safety with basic  mobility in preparation for discharge to the venue listed below.       Follow Up Recommendations Home health PT;Supervision for mobility/OOB;Supervision/Assistance - 24 hour    Equipment Recommendations  None recommended by PT    Recommendations for Other Services       Precautions / Restrictions Precautions Precautions: Fall;Other (comment) Precaution Comments: Lt hemi; abdominal wound Restrictions Weight Bearing Restrictions: No      Mobility  Bed Mobility Overal bed mobility: Needs Assistance Bed Mobility: Supine to Sit     Supine to sit: Total assist     General bed mobility comments: aftyer 60sec able to balance at EOB but needs supervision as appears to be attempting transfer  Transfers Overall transfer level: Needs assistance Equipment used: None Transfers: Sit to/from Stand;Stand Pivot Transfers Sit to Stand: From elevated surface;Min assist Stand pivot transfers: Total assist;+2 physical assistance(unable to take steps; may do better with RW)          Ambulation/Gait Ambulation/Gait assistance: (unable at this time)              Stairs            Wheelchair Mobility    Modified Rankin (Stroke Patients Only)       Balance Overall balance assessment: Needs assistance(for safety primarily at this time)                                           Pertinent Vitals/Pain Pain Assessment:  Faces Faces Pain Scale: No hurt Pain Intervention(s): Limited activity within patient's tolerance    Home Living Family/patient expects to be discharged to:: Private residence Living Arrangements: Spouse/significant other Available Help at Discharge: Family;Available 24 hours/day Type of Home: Apartment Home Access: Level entry     Home Layout: One level Home Equipment: Walker - 4 wheels;Bedside commode;Shower seat;Grab bars - tub/shower;Wheelchair - manual      Prior Function Level of Independence: Needs assistance   Gait /  Transfers Assistance Needed: limited household distances with RW or WC less than 67ft for each when leaving CIR 2 WA, was minA for be dmobility and transfers  ADL's / Homemaking Assistance Needed: needs help with ADL from husband        Hand Dominance   Dominant Hand: Right    Extremity/Trunk Assessment   Upper Extremity Assessment Upper Extremity Assessment: LUE deficits/detail LUE Coordination: decreased gross motor    Lower Extremity Assessment Lower Extremity Assessment: LLE deficits/detail LLE Coordination: decreased gross motor       Communication   Communication: Expressive difficulties(details unclear)  Cognition Arousal/Alertness: Awake/alert Behavior During Therapy: Flat affect Overall Cognitive Status: Impaired/Different from baseline                                        General Comments      Exercises     Assessment/Plan    PT Assessment Patient needs continued PT services  PT Problem List Decreased strength;Decreased cognition;Decreased activity tolerance;Decreased balance;Decreased mobility;Decreased safety awareness       PT Treatment Interventions DME instruction;Therapeutic exercise;Gait training;Functional mobility training;Therapeutic activities;Patient/family education;Cognitive remediation;Balance training    PT Goals (Current goals can be found in the Care Plan section)  Acute Rehab PT Goals PT Goal Formulation: Patient unable to participate in goal setting    Frequency Min 2X/week   Barriers to discharge        Co-evaluation               AM-PAC PT "6 Clicks" Mobility  Outcome Measure Help needed turning from your back to your side while in a flat bed without using bedrails?: Total Help needed moving from lying on your back to sitting on the side of a flat bed without using bedrails?: Total Help needed moving to and from a bed to a chair (including a wheelchair)?: Total Help needed standing up from a chair  using your arms (e.g., wheelchair or bedside chair)?: A Little Help needed to walk in hospital room?: A Lot Help needed climbing 3-5 steps with a railing? : Total 6 Click Score: 9    End of Session Equipment Utilized During Treatment: Gait belt Activity Tolerance: Patient tolerated treatment well;Patient limited by fatigue;Other (comment)(limited by AMS) Patient left: in chair;with chair alarm set;with family/visitor present;with call bell/phone within reach Nurse Communication: Mobility status PT Visit Diagnosis: Muscle weakness (generalized) (M62.81);Difficulty in walking, not elsewhere classified (R26.2);Unsteadiness on feet (R26.81);Other symptoms and signs involving the nervous system (R29.898)    Time: 9147-8295 PT Time Calculation (min) (ACUTE ONLY): 23 min   Charges:   PT Evaluation $PT Eval Moderate Complexity: 1 Mod PT Treatments $Therapeutic Exercise: 8-22 mins        11:52 AM, 12/08/18 Etta Grandchild, PT, DPT Physical Therapist - [facility]  [phone] ([place])   [place] 12/08/2018, 11:46 AM

## 2018-12-08 NOTE — Evaluation (Signed)
Clinical/Bedside Swallow Evaluation Patient Details  Name: Destiny [name] MRN: [id number] Date of Birth: 03/03/1956  Today's Date: 12/08/2018 Time: SLP Start Time (ACUTE ONLY): 79 SLP Stop Time (ACUTE ONLY): 1150 SLP Time Calculation (min) (ACUTE ONLY): 60 min  Past Medical History:  Past Medical History:  Diagnosis Date  . CAD (coronary artery disease)   . CHF (congestive heart failure) ([place])   . CVA (cerebral infarction)   . Female bladder prolapse   . GERD (gastroesophageal reflux disease)   . Hyperlipidemia   . Hypertension   . Hypokalemia   . Mitral valve disease   . Ovarian ca ([place])   . Sleep apnea   . Stroke [facility])    x3   Past Surgical History:  Past Surgical History:  Procedure Laterality Date  . CARPAL TUNNEL RELEASE Bilateral   . CATARACT EXTRACTION Right 11/22/2017  . CHOLECYSTECTOMY    . COLON SURGERY    . TENNIS ELBOW RELEASE/NIRSCHEL PROCEDURE Left   . TONSILLECTOMY     HPI:  Pt is a 63 y.o. female with a history of CAD, CHF, multiple CVAs on Plavix and asa, hypertension, hyperlipidemia, ovarian cancer s/p resection complicated by bowel perforation who presents for evaluation of weakness.  EMS was called to the house to assist in lifting patient up from the recliner.  She has had espisodes of this prior she has stated.  When they arrived to the house patient was warm to the touch and very weak which prompted them to bring her over to the emergency room.  Patient denies any pain including headache, chest pain, abdominal pain, back pain.  She denies fevers at home, cough, shortness of breath, nausea, vomiting, diarrhea.  She does endorse dysuria for a few days.  Patient did not have a fever on arrival to the emergency room.  Patient is very somnolent and falls asleep during questions.  Pt does have Cognitive deficits baseline s/p CVAs.  She was recently admitted to CIR for therapy to include Cognitive-linguistic therapy: "pt is considered at baseline for her  cognitive function. Extensive time spent with pt's husband in determining baseline abilities and at this time, pt is considered to be at baseline. Pt would benefit from supervision at discharge to ensure physical safety d/t continued weakness. No further ST services are indicated.".  Last evening, pt exhibited some Cognitive-linguistic difficulty but seems to be more appropriately responsive this morning during PT session and w/ NSG, per report.  Noted CXR reports.    Assessment / Plan / Recommendation Clinical Impression  Pt appears to present w/ adequate oropharyngeal phase swallowing function w/ no overt s/s of aspiration noted during po trials offered to her. However, this was a somewhat limited evaluation d/t pt's refusal of po's - she often gave a distasteful look on her face then said "no more of that". At the end, she accepted sherbert ice cream and fed self w/ setup support. Pt does exhibit LUE weakness and is unable to use her LUE for full self-feeding. Items were presented to her. Pt consumed few trials of thin liquids via cup/straw, purees, and soft solid x1 w/ no overt s/s of aspiration noted: no decline in Respiratory status and no decline in vocal quality during/post trials. Pt fed self w/ spoon after setup but became confused x1 allowing spoon to lay in her mouth and not removing it immediately - cues given. No gross oral phase deficits noted; pt exhibited adequate bolus management w/ trials of liquids and puree w/ timely  oral clearing b/t trials. She exhibited min increased mastication/oral phase time w/ trial of soft solid accepted x1. OM exam revealed slight-mild L labial decreased tone/ROM; lingual strength/ROM were WFL. Gag reflex +.  Recommend a more mech soft diet consistency w/ thin liquids for easier oral phase time, prepration and self-feeding d/t LUE weakness. Recommend general aspiration precautions; FULL Tray Setup at meals d/t LUE weakness; assistance w/ meals as needed. Recommend  pills in Puree for easier, safer swallowing d/t decline in Cognitive status. Recommend Dietician f/u d/t pt's decreased interest in foods/meals - this was quite similar last admission. Per Cognitive-linguistic assessment/tx at CIR, pt presents w/ baseline Cognitive-linguistic deficits and would do best in a setting of reduced distractions during meals in order to improve concentration to task.  Noted the recent CXR; ST services will continue to monitor pt's status during admission and can f/u w/ objective assessment of swallowing as indicated. NSG updated; precautions posted.  SLP Visit Diagnosis: Dysphagia, oral phase (R13.11);Attention and concentration deficit(s/p CVAs)    Aspiration Risk  Mild aspiration risk(reduced when following aspiration precautions; supervision )    Diet Recommendation  Mech soft diet consistency w/ finger foods; Thin liquids. General aspiration precautions; Reflux precautions. Supervision at all meals for assistance and follow through w/ meal tasks, precautions  Medication Administration: Whole meds with puree(for safer swallowing)    Other  Recommendations Recommended Consults: (Dietician f/u) Oral Care Recommendations: Oral care BID;Staff/trained caregiver to provide oral care Other Recommendations: (n/a)   Follow up Recommendations None(TBD)      Frequency and Duration min 2x/week  1 week       Prognosis Prognosis for Safe Diet Advancement: Fair(-Good) Barriers to Reach Goals: Cognitive deficits;Language deficits;Severity of deficits;Time post onset      Swallow Study   General Date of Onset: 12/07/18 HPI: Pt is a 63 y.o. female with a history of CAD, CHF, multiple CVAs on Plavix and asa, hypertension, hyperlipidemia, ovarian cancer s/p resection complicated by bowel perforation who presents for evaluation of weakness.  EMS was called to the house to assist in lifting patient up from the recliner.  She has had espisodes of this prior she has stated.  When  they arrived to the house patient was warm to the touch and very weak which prompted them to bring her over to the emergency room.  Patient denies any pain including headache, chest pain, abdominal pain, back pain.  She denies fevers at home, cough, shortness of breath, nausea, vomiting, diarrhea.  She does endorse dysuria for a few days.  Patient did not have a fever on arrival to the emergency room.  Patient is very somnolent and falls asleep during questions.  Pt does have Cognitive deficits baseline s/p CVAs.  She was recently admitted to CIR for therapy to include Cognitive-linguistic therapy: "pt is considered at baseline for her cognitive function. Extensive time spent with pt's husband in determining baseline abilities and at this time, pt is considered to be at baseline. Pt would benefit from supervision at discharge to ensure physical safety d/t continued weakness. No further ST services are indicated.".  Last evening, pt exhibited some Cognitive-linguistic difficulty but seems to be more appropriately responsive this morning during PT session and w/ NSG, per report.  Noted CXR reports.  Type of Study: Bedside Swallow Evaluation Previous Swallow Assessment: BSE on 4/21: Recommended Dys. 3 textures with thin liquids. Also followed at [facility] for Cog-linguistic tx; diet consistency there remained a level 3 w/ thin liquids. Diet Prior to  this Study: Dysphagia 3 (soft);Thin liquids(recommended at d/c from CIR) Temperature Spikes Noted: No(wbc 6.8) Respiratory Status: Room air History of Recent Intubation: No Behavior/Cognition: Cooperative;Pleasant mood;Confused;Distractible;Requires cueing(Awake) Oral Cavity Assessment: Within Functional Limits Oral Care Completed by SLP: Recent completion by staff Oral Cavity - Dentition: Adequate natural dentition Vision: Functional for self-feeding Self-Feeding Abilities: Able to feed self;Needs assist;Needs set up(weak UEs; especially LUE - baseline status per  notes) Patient Positioning: Upright in chair Baseline Vocal Quality: Normal(adequate when she spoke) Volitional Cough: Strong(required cues) Volitional Swallow: Able to elicit(required cues)    Oral/Motor/Sensory Function Overall Oral Motor/Sensory Function: Mild impairment(Gag reflex noted bilateral) Facial ROM: Reduced left(slight) Facial Symmetry: Abnormal symmetry left(slight) Facial Strength: Reduced left(slight) Lingual ROM: Within Functional Limits Lingual Symmetry: Within Functional Limits Lingual Strength: Within Functional Limits Velum: Within Functional Limits Mandible: Within Functional Limits   Ice Chips Ice chips: Within functional limits Presentation: Spoon(fed; 2 trials)   Thin Liquid Thin Liquid: Within functional limits Presentation: Cup;Self Fed;Straw(2 trials via cup; 3 trials via straw) Other Comments: pt refused further    Nectar Thick Nectar Thick Liquid: Not tested   Honey Thick Honey Thick Liquid: Not tested   Puree Puree: Within functional limits Presentation: Self Fed;Spoon(~2 ozs) Oral Phase Impairments: (none) Oral Phase Functional Implications: (none) Pharyngeal Phase Impairments: (none)   Solid     Solid: Impaired Presentation: Spoon;Self Fed(x1 accepted ) Oral Phase Impairments: (increased oral phase time) Oral Phase Functional Implications: (increased oral phase time) Pharyngeal Phase Impairments: (none)       Orinda Kenner, MS, CCC-SLP Jasper Ruminski 12/08/2018,1:10 PM

## 2018-12-08 NOTE — Progress Notes (Signed)
[facility] [place] NAME: Destiny [name]    MR#:  [id number]  DATE OF BIRTH:  Mar 02, 1956  SUBJECTIVE:  CHIEF COMPLAINT:   Chief Complaint  Patient presents with  . Fever   The patient is more awake, she is AAO x3 but looks confused.  Per her husband, the patient has confusion sometimes after stroke. REVIEW OF SYSTEMS:  Review of Systems  Unable to perform ROS: Mental status change    DRUG ALLERGIES:   Allergies  Allergen Reactions  . Atorvastatin Hives    No s/sx of anaphylaxis, just intermittent hives.  Does not seem to be a class effect as she has tolerated lovastatin previously No s/sx of anaphylaxis, just intermittent hives.  Does not seem to be a class effect as she has tolerated lovastatin previously  . Fluconazole Rash    Reaction to generic but not name brand Reaction to generic but not name brand CAN USE DIFLUCAN.DOES NOT BREAK OUT WITH DIFLUCAN.(allergic only to generic brand)  . Sertraline Itching  . Sulfa Antibiotics Rash and Other (See Comments)    "hot " sensation  . Sulfamethoxazole-Trimethoprim Rash and Other (See Comments)    "felt hot" Other reaction(s): Other (See Comments) "felt hot"  . Sulfasalazine Other (See Comments) and Rash    "hot " sensation   VITALS:  Blood pressure 108/67, pulse (!) 115, temperature 97.9 F (36.6 C), temperature source Oral, resp. rate 20, height 5\' 2"  (1.575 m), weight 88.2 kg, SpO2 95 %. PHYSICAL EXAMINATION:  Physical Exam Constitutional:      General: She is not in acute distress.    Appearance: Normal appearance. She is obese.  HENT:     Head: Normocephalic.     Mouth/Throat:     Mouth: Mucous membranes are moist.  Eyes:     General: No scleral icterus.    Conjunctiva/sclera: Conjunctivae normal.     Pupils: Pupils are equal, round, and reactive to light.  Neck:     Musculoskeletal: Normal range of motion and neck supple.     Vascular: No JVD.     Trachea: No  tracheal deviation.  Cardiovascular:     Rate and Rhythm: Normal rate and regular rhythm.     Heart sounds: Normal heart sounds. No murmur. No gallop.   Pulmonary:     Effort: Pulmonary effort is normal. No respiratory distress.     Breath sounds: Normal breath sounds. No wheezing or rales.  Abdominal:     General: Bowel sounds are normal. There is no distension.     Palpations: Abdomen is soft.     Tenderness: There is no abdominal tenderness. There is no rebound.  Musculoskeletal: Normal range of motion.        General: No tenderness.  Skin:    Findings: No erythema or rash.  Neurological:     Mental Status: She is alert and oriented to person, place, and time.     Cranial Nerves: No cranial nerve deficit.  Psychiatric:     Comments: Looks confused.    LABORATORY PANEL:  Female CBC Recent Labs  Lab 12/08/18 0606  WBC 6.8  HGB 7.5*  HCT 24.2*  PLT 160   ------------------------------------------------------------------------------------------------------------------ Chemistries  Recent Labs  Lab 12/07/18 1058 12/08/18 0606  NA 139 141  K 3.7 3.4*  CL 108 110  CO2 22 24  GLUCOSE 112* 101*  BUN 8 10  CREATININE 0.70 0.74  CALCIUM 9.4 8.8*  AST 33  --  ALT 28  --   ALKPHOS 65  --   BILITOT 0.5  --    RADIOLOGY:  No results found. ASSESSMENT AND PLAN:   HAP and UTI Continue cefepime and vancomycin, follow-up blood CBC and cultures.  Acute metabolic encephalopathy due to above.  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 Aspiration the fall precaution.  Recent acute CVA. Continue aspirin and Plavix and statin.  Hypertension.  Continue home hypertension medication. Hyperlipidemia.  Continue statin. Anemia of chronic disease.  Stable. Hypokalemia.  Potassium supplement.  Generalized weakness.  PT evaluation suggest home health and PT. All the records are reviewed and case discussed with Care Management/Social Worker. Management plans discussed with  the patient, her husband and they are in agreement.  CODE STATUS: Full Code  TOTAL TIME TAKING CARE OF THIS PATIENT: 27 minutes.   More than 50% of the time was spent in counseling/coordination of care: YES  POSSIBLE D/C IN 2 DAYS, DEPENDING ON CLINICAL CONDITION.   Demetrios Loll M.D on 12/08/2018 at 1:10 PM  Between 7am to 6pm - Pager - [phone]  After 6pm go to [email or web address] - [facility]

## 2018-12-09 LAB — BASIC METABOLIC PANEL
Anion gap: 8 (ref 5–15)
BUN: 7 mg/dL — ABNORMAL LOW (ref 8–23)
CO2: 25 mmol/L (ref 22–32)
Calcium: 9 mg/dL (ref 8.9–10.3)
Chloride: 109 mmol/L (ref 98–111)
Creatinine, Ser: 0.6 mg/dL (ref 0.44–1.00)
GFR calc Af Amer: >60 mL/min (ref >60)
GFR calc non Af Amer: >60 mL/min (ref >60)
Glucose, Bld: 100 mg/dL — ABNORMAL HIGH (ref 70–99)
Potassium: 3.2 mmol/L — ABNORMAL LOW (ref 3.5–5.1)
Sodium: 142 mmol/L (ref 135–145)

## 2018-12-09 LAB — HEMOGLOBIN: Hemoglobin: 7.5 g/dL — ABNORMAL LOW (ref 12.0–15.0)

## 2018-12-09 MED ORDER — LEVOFLOXACIN 750 MG PO TABS
750.0000 mg | ORAL_TABLET | Freq: Every day | ORAL | Status: AC
  Administered 2018-12-09 – 2018-12-12 (×4): 750 mg via ORAL
  Filled 2018-12-09 (×4): qty 1

## 2018-12-09 MED ORDER — POTASSIUM CHLORIDE CRYS ER 20 MEQ PO TBCR
40.0000 meq | EXTENDED_RELEASE_TABLET | Freq: Once | ORAL | Status: AC
  Administered 2018-12-09: 08:00:00 40 meq via ORAL
  Filled 2018-12-09: qty 2

## 2018-12-09 MED ORDER — MAGNESIUM SULFATE 2 GM/50ML IV SOLN
2.0000 g | Freq: Once | INTRAVENOUS | Status: AC
  Administered 2018-12-09: 2 g via INTRAVENOUS
  Filled 2018-12-09: qty 50

## 2018-12-09 MED ORDER — BOOST / RESOURCE BREEZE PO LIQD CUSTOM
1.0000 | Freq: Three times a day (TID) | ORAL | Status: DC
  Administered 2018-12-09 – 2018-12-13 (×11): 1 via ORAL

## 2018-12-09 NOTE — Consult Note (Signed)
Pharmacy Antibiotic Note  Destiny [name] is a 63 y.o. female admitted on 12/07/2018 with pneumonia.  Pharmacy has been consulted for PO abx dosing. Pt was on cefepime and vancomycin. Bcx remain negative. Pt is afeb and WBC is WNL. Pt received 3 days of board abx.   Plan: Will change IV abx to levofloxacin 750 mg daily PO.   Height: 5\' 2"  (157.5 cm) Weight: 194 lb 7.5 oz (88.2 kg) IBW/kg (Calculated) : 50.1  Temp (24hrs), Avg:98.4 F (36.9 C), Min:98.3 F (36.8 C), Max:98.6 F (37 C)  Recent Labs  Lab 12/07/18 1058 12/08/18 0606 12/09/18 0502  WBC 8.7 6.8  --   CREATININE 0.70 0.74 0.60  LATICACIDVEN 1.6  --   --     Estimated Creatinine Clearance: 73.2 mL/min (by C-G formula based on SCr of 0.6 mg/dL).    Allergies  Allergen Reactions  . Atorvastatin Hives    No s/sx of anaphylaxis, just intermittent hives.  Does not seem to be a class effect as she has tolerated lovastatin previously No s/sx of anaphylaxis, just intermittent hives.  Does not seem to be a class effect as she has tolerated lovastatin previously  . Fluconazole Rash    Reaction to generic but not name brand Reaction to generic but not name brand CAN USE DIFLUCAN.DOES NOT BREAK OUT WITH DIFLUCAN.(allergic only to generic brand)  . Sertraline Itching  . Sulfa Antibiotics Rash and Other (See Comments)    "hot " sensation  . Sulfamethoxazole-Trimethoprim Rash and Other (See Comments)    "felt hot" Other reaction(s): Other (See Comments) "felt hot"  . Sulfasalazine Other (See Comments) and Rash    "hot " sensation    Antimicrobials this admission: 5/22 cefepime >> 5/24 5/22 vancomycin >> 5/24 5/24 Levofloxacin >>   Dose adjustments this admission: None  Microbiology results: 5/22 BCx: Pending 5/22 MRSA PCR: positive   Thank you for allowing pharmacy to be a part of this patient's care.  Oswald Hillock, PharmD, BCPS  Clinical Pharmacist  12/09/2018 6:58 AM

## 2018-12-09 NOTE — Progress Notes (Signed)
Initial Nutrition Assessment  RD working remotely.  DOCUMENTATION CODES:   Obesity unspecified  INTERVENTION:  Provide Boost Breeze po TID, each supplement provides 250 kcal and 9 grams of protein.  Provide Magic cup TID with meals, each supplement provides 290 kcal and 9 grams of protein.  NUTRITION DIAGNOSIS:   Inadequate oral intake related to lethargy/confusion, decreased appetite as evidenced by 15.1% weight loss over 3.5 months.  GOAL:   Patient will meet greater than or equal to 90% of their needs  MONITOR:   PO intake, Supplement acceptance, Labs, Weight trends, Skin, I & O's  REASON FOR ASSESSMENT:   Malnutrition Screening Tool, Consult Assessment of nutrition requirement/status  ASSESSMENT:   63 year old female with PMHx of sleep apnea, hx CVA, GERD, CAD, HTN, HLD, CHF admitted with PNA, UTI, acute metabolic encephalopathy.   -Following SLP evaluation on 5/23 comment was added to diet order for mechanical soft consistency.  Attempted to call patient's phone to obtain nutrition/weight history but she was unable to answer the phone. Patient is known to this RD from a recent admission last month. She did not eat well during that admission but did fairly well with drinking Boost Breeze (does not like Ensure Enlive) and eating [employer]. Patient then went to [facility] where her intake did start to improve by discharge.  Per chart patient was 103.9 kg on 08/17/2018, 90.9 kg on 11/02/2018, and 88.2 kg on 5/14. That is a loss of 15.7 kg (15.1% body weight) over 3.5 months, which is significant for time frame. However, unsure how much of that may have been related to fluid loss. Unsure if current weight is accurate as it is the exact same weight from 5/14.  Medications reviewed and include: vitamin D3 5000 units daily, Colace 100 mg daily, famotidine, Levaquin, melatonin 5 mg QHS, potassium chloride 20 mEq daily, Crestor, spironolactone 12.5 mg daily.  Labs reviewed: Potassium 3.2,  BUN 7.  Patient is at risk for malnutrition. Unable to confirm if patient is malnourished without more detailed history and completing NFPE.  NUTRITION - FOCUSED PHYSICAL EXAM:  Unable to complete at this time.  Diet Order:   Diet Order            Diet Heart Room service appropriate? Yes with Assist; Fluid consistency: Thin  Diet effective now             EDUCATION NEEDS:   No education needs have been identified at this time  Skin:  Skin Assessment: Skin Integrity Issues:(old abdominal incision open at top; ecchymosis; MSAD to buttocks and groin)  Last BM:  12/08/2018 - small type 6  Height:   Ht Readings from Last 1 Encounters:  12/07/18 5\' 2"  (1.575 m)   Weight:   Wt Readings from Last 1 Encounters:  12/07/18 88.2 kg   Ideal Body Weight:  50 kg  BMI:  Body mass index is 35.57 kg/m.  Estimated Nutritional Needs:   Kcal:  1700-1900  Protein:  85-95 grams  Fluid:  1.5-1.75 L/day  Willey Blade, MS, RD, LDN Office: [phone] Pager: [phone] After Hours/Weekend Pager: [phone]

## 2018-12-09 NOTE — Progress Notes (Addendum)
[facility] [place] NAME: Destiny [name]    MR#:  [id number]  DATE OF BIRTH:  08-10-1955  SUBJECTIVE:  CHIEF COMPLAINT:   Chief Complaint  Patient presents with  . Fever   The patient is confused. REVIEW OF SYSTEMS:  Review of Systems  Unable to perform ROS: Medical condition    DRUG ALLERGIES:   Allergies  Allergen Reactions  . Atorvastatin Hives    No s/sx of anaphylaxis, just intermittent hives.  Does not seem to be a class effect as she has tolerated lovastatin previously No s/sx of anaphylaxis, just intermittent hives.  Does not seem to be a class effect as she has tolerated lovastatin previously  . Fluconazole Rash    Reaction to generic but not name brand Reaction to generic but not name brand CAN USE DIFLUCAN.DOES NOT BREAK OUT WITH DIFLUCAN.(allergic only to generic brand)  . Sertraline Itching  . Sulfa Antibiotics Rash and Other (See Comments)    "hot " sensation  . Sulfamethoxazole-Trimethoprim Rash and Other (See Comments)    "felt hot" Other reaction(s): Other (See Comments) "felt hot"  . Sulfasalazine Other (See Comments) and Rash    "hot " sensation   VITALS:  Blood pressure 112/80, pulse 90, temperature 98.3 F (36.8 C), temperature source Oral, resp. rate 18, height 5\' 2"  (1.575 m), weight 88.2 kg, SpO2 99 %. PHYSICAL EXAMINATION:  Physical Exam Constitutional:      General: She is not in acute distress.    Appearance: Normal appearance. She is obese.  HENT:     Head: Normocephalic.     Mouth/Throat:     Mouth: Mucous membranes are moist.  Eyes:     General: No scleral icterus.    Conjunctiva/sclera: Conjunctivae normal.     Pupils: Pupils are equal, round, and reactive to light.  Neck:     Musculoskeletal: Normal range of motion and neck supple.     Vascular: No JVD.     Trachea: No tracheal deviation.  Cardiovascular:     Rate and Rhythm: Normal rate and regular rhythm.     Heart sounds:  Normal heart sounds. No murmur. No gallop.   Pulmonary:     Effort: Pulmonary effort is normal. No respiratory distress.     Breath sounds: Normal breath sounds. No wheezing or rales.  Abdominal:     General: Bowel sounds are normal. There is no distension.     Palpations: Abdomen is soft.     Tenderness: There is no abdominal tenderness. There is no rebound.  Musculoskeletal: Normal range of motion.        General: No tenderness.  Skin:    Findings: No erythema or rash.  Neurological:     Mental Status: She is alert and oriented to person, place, and time.     Cranial Nerves: No cranial nerve deficit.  Psychiatric:     Comments: Looks confused.    LABORATORY PANEL:  Female CBC Recent Labs  Lab 12/08/18 0606 12/09/18 0508  WBC 6.8  --   HGB 7.5* 7.5*  HCT 24.2*  --   PLT 160  --    ------------------------------------------------------------------------------------------------------------------ Chemistries  Recent Labs  Lab 12/07/18 1058  12/08/18 1433 12/09/18 0502  NA 139   < >  --  142  K 3.7   < >  --  3.2*  CL 108   < >  --  109  CO2 22   < >  --  25  GLUCOSE 112*   < >  --  100*  BUN 8   < >  --  7*  CREATININE 0.70   < >  --  0.60  CALCIUM 9.4   < >  --  9.0  MG  --   --  1.7  --   AST 33  --   --   --   ALT 28  --   --   --   ALKPHOS 65  --   --   --   BILITOT 0.5  --   --   --    < > = values in this interval not displayed.   RADIOLOGY:  No results found. ASSESSMENT AND PLAN:   HAP and UTI She has been treated with cefepime and vancomycin, changed to Levaquin p.o. negative blood cultures so far. Acute metabolic encephalopathy due to above.  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 Aspiration the fall precaution.  Recent acute CVA.  Possible cognitive impairment. Continue aspirin and Plavix and statin.  Continue PT.  Hypertension.  Continue home hypertension medication. Hyperlipidemia.  Continue statin. Anemia of chronic disease.  Stable.  Hypokalemia.  Potassium supplement. Hypomagnesemia.  IV magnesium.  Generalized weakness.  PT evaluation suggest home health and PT. All the records are reviewed and case discussed with Care Management/Social Worker. Management plans discussed with the patient, her husband and they are in agreement.  CODE STATUS: Full Code  TOTAL TIME TAKING CARE OF THIS PATIENT: 27 minutes.   More than 50% of the time was spent in counseling/coordination of care: YES  POSSIBLE D/C IN 2 DAYS, DEPENDING ON CLINICAL CONDITION.   Demetrios Loll M.D on 12/09/2018 at 11:47 AM  Between 7am to 6pm - Pager - [phone]  After 6pm go to [email or web address] - [facility]

## 2018-12-09 NOTE — Progress Notes (Signed)
Physical Therapy Treatment Patient Details Name: Destiny [name] MRN: [id number] DOB: 1956/02/24 Today's Date: 12/09/2018    History of Present Illness Destiny [name] is a 63yo F who comes to [facility] on 5/22 c fever, oted to have UTI/PNA. Pt just recently DC from [place] 4/25-5/13 in [place]. PMH: CAD, CHF, CVA c Left hemiplegia, HTN, HOLD, ovarian CA, OSA, GERD.     PT Comments    Patient with limited functional progress despite antibiotics since admission.  Remains with significant delays in processing and task initiation, poor motor planning/coordination, limited ability to actively assist with mobility tasks. Continues to require max/total assist +2 for all mobility efforts; intermittent LE buckling/myoclonic jerking noted.  Significant fall risk and unsafe to attempt any mobility without +2 assist.  Given limited progression and significant deviation from baseline 10-days prior (60' with RW, min assist), feel strongly that patient husband unsafe/unable to manage in home environment.  Discharge recommendation updated to reflect recommendation for STR.    Follow Up Recommendations  SNF     Equipment Recommendations       Recommendations for Other Services       Precautions / Restrictions Precautions Precautions: Fall Restrictions Weight Bearing Restrictions: No    Mobility  Bed Mobility Overal bed mobility: Needs Assistance Bed Mobility: Sit to Supine       Sit to supine: Total assist;+2 for physical assistance      Transfers Overall transfer level: Needs assistance Equipment used: Rolling walker (2 wheeled) Transfers: Sit to/from Stand   Stand pivot transfers: Max assist;Total assist;+2 physical assistance       General transfer comment: level of assist deteriorated from max assist +1 to max/total assist +2 as fatigue increased throughout session  Ambulation/Gait Ambulation/Gait assistance: Max assist;+2 physical assistance Gait Distance (Feet): 5 Feet Assistive  device: Rolling walker (2 wheeled)       General Gait Details: narrowed BOS, difficulty maintaining L UE on RW; dep assist for weight shift to unweight/advance bilat LEs. Intermittent buckling/myoclonic jerking, max/dep assist for balance recovery   Stairs             Wheelchair Mobility    Modified Rankin (Stroke Patients Only)       Balance Overall balance assessment: Needs assistance Sitting-balance support: Feet supported;No upper extremity supported Sitting balance-Leahy Scale: Fair     Standing balance support: Bilateral upper extremity supported Standing balance-Leahy Scale: Zero                              Cognition Arousal/Alertness: Awake/alert Behavior During Therapy: Flat affect Overall Cognitive Status: Impaired/Different from baseline                                 General Comments: significant delay in processing and response; generally confused with lmited insight into deficits      Exercises Other Exercises Other Exercises: Sit/stand from recliner with RW, max assist +1 moving towards max/total assist +2 as fatigue increased Other Exercises: SPT, recliner to BSC with RW, max/total assist +2--difficulty initiating and coordinating functional movement of LEs for purposeful, on command activity.  Once on [facility], unable to maintain attention to task long enough to be successful (kept forgetting she was on [facility]) Other Exercises: SPT, BSC to bed with RW, total assist +2--significant difficulty coordinating movement at this point, at times, crossing LEs in standing/unable to purposefully step; ultimately requirign  total assist +2 for transfer and backwards step towards bed    General Comments        Pertinent Vitals/Pain Pain Assessment: Faces Faces Pain Scale: Hurts even more Pain Location: buttocks Pain Descriptors / Indicators: Grimacing Pain Intervention(s): Limited activity within patient's tolerance;Monitored during  session;Repositioned    Home Living                      Prior Function            PT Goals (current goals can now be found in the care plan section) Acute Rehab PT Goals PT Goal Formulation: Patient unable to participate in goal setting Progress towards PT goals: Not progressing toward goals - comment    Frequency    Min 2X/week      PT Plan Discharge plan needs to be updated    Co-evaluation              AM-PAC PT "6 Clicks" Mobility   Outcome Measure  Help needed turning from your back to your side while in a flat bed without using bedrails?: Total Help needed moving from lying on your back to sitting on the side of a flat bed without using bedrails?: Total Help needed moving to and from a bed to a chair (including a wheelchair)?: Total Help needed standing up from a chair using your arms (e.g., wheelchair or bedside chair)?: A Lot Help needed to walk in hospital room?: A Lot Help needed climbing 3-5 steps with a railing? : Total 6 Click Score: 8    End of Session Equipment Utilized During Treatment: Gait belt Activity Tolerance: Patient limited by fatigue Patient left: in bed;with call bell/phone within reach;with bed alarm set;with nursing/sitter in room Nurse Communication: Mobility status PT Visit Diagnosis: Muscle weakness (generalized) (M62.81);Difficulty in walking, not elsewhere classified (R26.2);Unsteadiness on feet (R26.81);Other symptoms and signs involving the nervous system (K93.818)     Time: 2993-7169 PT Time Calculation (min) (ACUTE ONLY): 39 min  Charges:  $Therapeutic Activity: 38-52 mins                     Kier Smead H. Owens Shark, PT, DPT, NCS 12/09/18, 4:33 PM [phone]

## 2018-12-10 ENCOUNTER — Inpatient Hospital Stay: Payer: Medicaid Other

## 2018-12-10 LAB — URINE CULTURE: Culture: >=100000 — AB

## 2018-12-10 LAB — MAGNESIUM: Magnesium: 2.2 mg/dL (ref 1.7–2.4)

## 2018-12-10 MED ORDER — SODIUM CHLORIDE 0.9 % IV SOLN
INTRAVENOUS | Status: AC
  Administered 2018-12-10 – 2018-12-11 (×2): via INTRAVENOUS

## 2018-12-10 NOTE — Progress Notes (Addendum)
Around 12:15,pt. was found on the floor lying sideway. Pt.  mental status was at baseline. No break or bruising in skin noted to left side of body. Pt. denies pain. MD assessed pt with no issues observed. Spouse, Destiny [name] was notified. CT on head completed with no acute change. Behavior, headache, dizziness and  vomiting was monitored Q30 minutes. X 2 hours. Denies  Headache,dizziness,no changed in behavior and no vomiting observed. Behavior at baseline. VS per protocol was completed.

## 2018-12-10 NOTE — Progress Notes (Addendum)
[place] at [place] NAME: Destiny [name]    MR#:  [id number]  DATE OF BIRTH:  [birth year]  SUBJECTIVE:  CHIEF COMPLAINT:   Chief Complaint  Patient presents with  . Fever   The patient is confused.  She rolled over from bed to the floor this afternoon.  She denies any syncope or injury.  She needs help for feeding per RN. REVIEW OF SYSTEMS:  Review of Systems  Unable to perform ROS: Medical condition    DRUG ALLERGIES:   Allergies  Allergen Reactions  . Atorvastatin Hives    No s/sx of anaphylaxis, just intermittent hives.  Does not seem to be a class effect as she has tolerated lovastatin previously No s/sx of anaphylaxis, just intermittent hives.  Does not seem to be a class effect as she has tolerated lovastatin previously  . Fluconazole Rash    Reaction to generic but not name brand Reaction to generic but not name brand CAN USE DIFLUCAN.DOES NOT BREAK OUT WITH DIFLUCAN.(allergic only to generic brand)  . Sertraline Itching  . Sulfa Antibiotics Rash and Other (See Comments)    "hot " sensation  . Sulfamethoxazole-Trimethoprim Rash and Other (See Comments)    "felt hot" Other reaction(s): Other (See Comments) "felt hot"  . Sulfasalazine Other (See Comments) and Rash    "hot " sensation   VITALS:  Blood pressure 114/81, pulse (!) 124, temperature 98.2 F (36.8 C), temperature source Axillary, resp. rate 16, height 5\' 2"  (1.575 m), weight 88.2 kg, SpO2 100 %. PHYSICAL EXAMINATION:  Physical Exam Constitutional:      General: She is not in acute distress.    Appearance: Normal appearance. She is obese.  HENT:     Head: Normocephalic.     Mouth/Throat:     Mouth: Mucous membranes are moist.  Eyes:     General: No scleral icterus.    Conjunctiva/sclera: Conjunctivae normal.     Pupils: Pupils are equal, round, and reactive to light.  Neck:     Musculoskeletal: Normal range of motion and neck supple.     Vascular:  No JVD.     Trachea: No tracheal deviation.  Cardiovascular:     Rate and Rhythm: Normal rate and regular rhythm.     Heart sounds: Normal heart sounds. No murmur. No gallop.   Pulmonary:     Effort: Pulmonary effort is normal. No respiratory distress.     Breath sounds: Normal breath sounds. No wheezing or rales.  Abdominal:     General: Bowel sounds are normal. There is no distension.     Palpations: Abdomen is soft.     Tenderness: There is no abdominal tenderness. There is no rebound.  Musculoskeletal: Normal range of motion.        General: No tenderness.  Skin:    Findings: No erythema or rash.  Neurological:     Mental Status: She is alert and oriented to person, place, and time.     Cranial Nerves: No cranial nerve deficit.  Psychiatric:     Comments: Looks confused.    LABORATORY PANEL:  Female CBC Recent Labs  Lab 12/08/18 0606 12/09/18 0508  WBC 6.8  --   HGB 7.5* 7.5*  HCT 24.2*  --   PLT 160  --    ------------------------------------------------------------------------------------------------------------------ Chemistries  Recent Labs  Lab 12/07/18 1058  12/09/18 0502 12/10/18 0429  NA 139   < > 142  --   K 3.7   < >  3.2*  --   CL 108   < > 109  --   CO2 22   < > 25  --   GLUCOSE 112*   < > 100*  --   BUN 8   < > 7*  --   CREATININE 0.70   < > 0.60  --   CALCIUM 9.4   < > 9.0  --   MG  --    < >  --  2.2  AST 33  --   --   --   ALT 28  --   --   --   ALKPHOS 65  --   --   --   BILITOT 0.5  --   --   --    < > = values in this interval not displayed.   RADIOLOGY:  No results found. ASSESSMENT AND PLAN:   HAP and UTI She has been treated with cefepime and vancomycin, changed to Levaquin p.o. negative blood cultures so far. Acute metabolic encephalopathy due to above.  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 Aspiration the fall precaution.  Recent acute CVA.  Possible cognitive impairment. Continue aspirin and Plavix and statin.   Continue PT.  Hypertension.  Continue home hypertension medication. Hyperlipidemia.  Continue statin. Anemia of chronic disease.  Stable. Hypokalemia.  Potassium supplement. Hypomagnesemia.  Improved with IV magnesium. Follow-up today.  CT of the head. Generalized weakness.  PT evaluation suggest SNF. All the records are reviewed and case discussed with Care Management/Social Worker. Management plans discussed with the patient, her husband and her mother and they are in agreement.  CODE STATUS: Full Code  TOTAL TIME TAKING CARE OF THIS PATIENT: 36 minutes.   More than 50% of the time was spent in counseling/coordination of care: YES  POSSIBLE D/C IN 1-2 DAYS, DEPENDING ON CLINICAL CONDITION.   Demetrios Loll M.D on 12/10/2018 at 1:47 PM  Between 7am to 6pm - Pager - [phone]  After 6pm go to [email or web address] - [place] [facility]

## 2018-12-11 ENCOUNTER — Telehealth: Payer: Self-pay | Admitting: *Deleted

## 2018-12-11 LAB — BASIC METABOLIC PANEL
Anion gap: 10 (ref 5–15)
BUN: 8 mg/dL (ref 8–23)
CO2: 25 mmol/L (ref 22–32)
Calcium: 9.4 mg/dL (ref 8.9–10.3)
Chloride: 105 mmol/L (ref 98–111)
Creatinine, Ser: 0.66 mg/dL (ref 0.44–1.00)
GFR calc Af Amer: >60 mL/min (ref >60)
GFR calc non Af Amer: >60 mL/min (ref >60)
Glucose, Bld: 110 mg/dL — ABNORMAL HIGH (ref 70–99)
Potassium: 3.6 mmol/L (ref 3.5–5.1)
Sodium: 140 mmol/L (ref 135–145)

## 2018-12-11 MED ORDER — LEVOFLOXACIN 750 MG PO TABS: 750.0000 mg | ORAL_TABLET | Freq: Every day | ORAL | 0 refills | Status: AC

## 2018-12-11 MED ORDER — HEPARIN SOD (PORK) LOCK FLUSH 10 UNIT/ML IV SOLN
10.0000 [IU] | Freq: Once | INTRAVENOUS | Status: AC
  Administered 2018-12-13: 18:00:00 10 [IU]
  Filled 2018-12-11: qty 1

## 2018-12-11 MED ORDER — CHLORHEXIDINE GLUCONATE CLOTH 2 % EX PADS
6.0000 | MEDICATED_PAD | Freq: Every day | CUTANEOUS | Status: DC
  Administered 2018-12-11 – 2018-12-13 (×3): 6 via TOPICAL

## 2018-12-11 MED ORDER — MUPIROCIN 2 % EX OINT
1.0000 "application " | TOPICAL_OINTMENT | Freq: Two times a day (BID) | CUTANEOUS | Status: DC
  Administered 2018-12-11 – 2018-12-13 (×5): 1 via NASAL
  Filled 2018-12-11: qty 22

## 2018-12-11 MED ORDER — METOPROLOL TARTRATE 5 MG/5ML IV SOLN
5.0000 mg | INTRAVENOUS | Status: DC | PRN
  Administered 2018-12-11: 5 mg via INTRAVENOUS
  Filled 2018-12-11: qty 5

## 2018-12-11 NOTE — Progress Notes (Signed)
MD notified: Sorry to bother you this patient's husband keeps calling and wants to know if you could call him at [phone], he still has come questions.

## 2018-12-11 NOTE — Discharge Summary (Addendum)
[place] at [place] NAME: Destiny [name]    MR#:  [id number]  DATE OF BIRTH:  June 24, 1956  DATE OF ADMISSION:  12/07/2018 ADMITTING PHYSICIAN: Demetrios Loll, MD  DATE OF DISCHARGE: 12/13/2018  PRIMARY CARE PHYSICIAN: [name], MD    ADMISSION DIAGNOSIS:  Encephalopathy acute [G93.40] Acute cystitis with hematuria [N30.01] HCAP (healthcare-associated pneumonia) [J18.9]  DISCHARGE DIAGNOSIS:  Active Problems:   HAP (hospital-acquired pneumonia)   SECONDARY DIAGNOSIS:   Past Medical History:  Diagnosis Date  . CAD (coronary artery disease)   . CHF (congestive heart failure) ([place])   . CVA (cerebral infarction)   . Female bladder prolapse   . GERD (gastroesophageal reflux disease)   . Hyperlipidemia   . Hypertension   . Hypokalemia   . Mitral valve disease   . Ovarian ca ([place])   . Sleep apnea   . Stroke [facility])    x3    HOSPITAL COURSE:  63 year old female with a history of CAD and chronic diastolic heart failure with recent hospitalization due to acute CVA who presented from home due to fever and confusion. 1.  Acute metabolic encephalopathy in the setting of healthcare-acquired pneumonia as well as urinary tract infection along with recent CVA: Patient appears to be at baseline  2.HCAP (RLL): Patient transition to oral antibiotics. Patient can be treated through Sunday. 3.  Klebsiella urinary tract infection: Continue Levaquin.  4.  Recent acute CVA: Continue aspirin, Plavix and statin 5.  Chronic diastolic heart failure without signs of exacerbation 6.  Depression: Continue Paxil 7.  Essential hypertension: Continue Aldactone  COVID 19 negative test DISCHARGE CONDITIONS AND DIET:   stable Regular diet  CONSULTS OBTAINED:    DRUG ALLERGIES:   Allergies  Allergen Reactions  . Atorvastatin Hives    No s/sx of anaphylaxis, just intermittent hives.  Does not seem to be a class effect as she has tolerated lovastatin  previously No s/sx of anaphylaxis, just intermittent hives.  Does not seem to be a class effect as she has tolerated lovastatin previously  . Fluconazole Rash    Reaction to generic but not name brand Reaction to generic but not name brand CAN USE DIFLUCAN.DOES NOT BREAK OUT WITH DIFLUCAN.(allergic only to generic brand)  . Sertraline Itching  . Sulfa Antibiotics Rash and Other (See Comments)    "hot " sensation  . Sulfamethoxazole-Trimethoprim Rash and Other (See Comments)    "felt hot" Other reaction(s): Other (See Comments) "felt hot"  . Sulfasalazine Other (See Comments) and Rash    "hot " sensation    DISCHARGE MEDICATIONS:   Allergies as of 12/11/2018      Reactions   Atorvastatin Hives   No s/sx of anaphylaxis, just intermittent hives.  Does not seem to be a class effect as she has tolerated lovastatin previously No s/sx of anaphylaxis, just intermittent hives.  Does not seem to be a class effect as she has tolerated lovastatin previously   Fluconazole Rash   Reaction to generic but not name brand Reaction to generic but not name brand CAN USE DIFLUCAN.DOES NOT BREAK OUT WITH DIFLUCAN.(allergic only to generic brand)   Sertraline Itching   Sulfa Antibiotics Rash, Other (See Comments)   "hot " sensation   Sulfamethoxazole-trimethoprim Rash, Other (See Comments)   "felt hot" Other reaction(s): Other (See Comments) "felt hot"   Sulfasalazine Other (See Comments), Rash   "hot " sensation      Medication List    STOP taking  these medications   potassium chloride SA 20 MEQ tablet Commonly known as:  K-DUR   venlafaxine XR 75 MG 24 hr capsule Commonly known as:  EFFEXOR-XR     TAKE these medications   acetaminophen 325 MG tablet Commonly known as:  TYLENOL Take 1-2 tablets (325-650 mg total) by mouth every 4 (four) hours as needed for mild pain.   aspirin 81 MG chewable tablet Chew 81 mg by mouth daily.   clonazePAM 0.5 MG tablet Commonly known as:   KLONOPIN Take 1 tablet (0.5 mg total) by mouth at bedtime as needed for anxiety.   clopidogrel 75 MG tablet Commonly known as:  PLAVIX Take 1 tablet (75 mg total) by mouth daily.   famotidine 20 MG tablet Commonly known as:  PEPCID Take 1 tablet (20 mg total) by mouth 2 (two) times daily.   fluticasone 50 MCG/ACT nasal spray Commonly known as:  FLONASE Place 2 sprays into both nostrils daily as needed for allergies or rhinitis.   levofloxacin 750 MG tablet Commonly known as:  LEVAQUIN Take 1 tablet (750 mg total) by mouth daily for 5 days.   Melatonin 3 MG Tabs Take 3 mg by mouth at bedtime.   multivitamin-lutein Caps capsule Take 1 capsule by mouth daily.   nitroGLYCERIN 0.4 MG SL tablet Commonly known as:  NITROSTAT Place 0.4 mg under the tongue every 5 (five) minutes as needed for chest pain.   oxybutynin 5 MG tablet Commonly known as:  DITROPAN Take 1 tablet (5 mg total) by mouth 2 (two) times daily.   PARoxetine 30 MG tablet Commonly known as:  PAXIL Take 1 tablet (30 mg total) by mouth daily.   pregabalin 75 MG capsule Commonly known as:  LYRICA Take 1 capsule (75 mg total) by mouth 2 (two) times daily.   pregabalin 150 MG capsule Commonly known as:  LYRICA Take 1 capsule (150 mg total) by mouth at bedtime.   rosuvastatin 20 MG tablet Commonly known as:  CRESTOR Take 1 tablet (20 mg total) by mouth at bedtime.   senna 8.6 MG Tabs tablet Commonly known as:  SENOKOT Take 2 tablets by mouth at bedtime as needed for mild constipation.   spironolactone 25 MG tablet Commonly known as:  ALDACTONE Take 0.5 tablets (12.5 mg total) by mouth daily.   STOOL SOFTENER PO Take 100 mg by mouth daily.   Vitamin D3 125 MCG (5000 UT) Caps Take 1 capsule (5,000 Units total) by mouth daily.            Durable Medical Equipment  (From admission, onward)         Start     Ordered   12/11/18 1004  DME tub bench  Once     12/11/18 1003   12/11/18 1003  For  home use only DME Gilford Rile  [facility])  Once    Question:  Patient needs a walker to treat with the following condition  Answer:  Weakness   12/11/18 1003   12/11/18 0000  DME Bedside commode    Question:  Patient needs a bedside commode to treat with the following condition  Answer:  Weakness   12/11/18 1003            Today   CHIEF COMPLAINT:  Patient at baseline Husband cannot take care of patient alone at home PT RECS SNF   VITAL SIGNS:  Blood pressure 102/72, pulse (!) 117, temperature 98.7 F (37.1 C), temperature source Oral, resp. rate 19, height  5\' 2"  (1.575 m), weight 88.2 kg, SpO2 95 %.   REVIEW OF SYSTEMS:  Review of Systems  Unable to perform ROS: Acuity of condition     PHYSICAL EXAMINATION:  GENERAL:  63 y.o.-year-old patient lying in the bed with no acute distress.  NECK:  Supple, no jugular venous distention. No thyroid enlargement, no tenderness.  LUNGS: Normal breath sounds bilaterally, no wheezing, rales,rhonchi  No use of accessory muscles of respiration.  CARDIOVASCULAR: S1, S2 normal. No murmurs, rubs, or gallops.  ABDOMEN: Soft, non-tender, non-distended. Bowel sounds present. No organomegaly or mass.  EXTREMITIES: No pedal edema, cyanosis, or clubbing.  PSYCHIATRIC: The patient is alert and oriented x name confused at baseline  SKIN: No obvious rash, lesion, or ulcer.   DATA REVIEW:   CBC Recent Labs  Lab 12/08/18 0606 12/09/18 0508  WBC 6.8  --   HGB 7.5* 7.5*  HCT 24.2*  --   PLT 160  --     Chemistries  Recent Labs  Lab 12/07/18 1058  12/10/18 0429 12/11/18 0315  NA 139   < >  --  140  K 3.7   < >  --  3.6  CL 108   < >  --  105  CO2 22   < >  --  25  GLUCOSE 112*   < >  --  110*  BUN 8   < >  --  8  CREATININE 0.70   < >  --  0.66  CALCIUM 9.4   < >  --  9.4  MG  --    < > 2.2  --   AST 33  --   --   --   ALT 28  --   --   --   ALKPHOS 65  --   --   --   BILITOT 0.5  --   --   --    < > = values in this interval not  displayed.    Cardiac Enzymes Recent Labs  Lab 12/07/18 1058  TROPONINI <0.03    Microbiology Results  @MICRORSLT48 @  RADIOLOGY:  Ct Head Wo Contrast  Result Date: 12/10/2018 CLINICAL DATA:  Head trauma, neurological decline, acute metabolic encephalopathy, follow-up infarction EXAM: CT HEAD WITHOUT CONTRAST TECHNIQUE: Contiguous axial images were obtained from the base of the skull through the vertex without intravenous contrast. COMPARISON:  CT brain, MR brain, 11/04/2018 FINDINGS: Brain: Redemonstrated hypodensity of the mid left frontal lobe (series 2, image 23) and left parietal posterior watershed (series 2, image 18). 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 Incidental note of cavum septum pellucidum et vergae variant of the lateral ventricles. Mild underlying periventricular white matter hypodensity. Vascular: No hyperdense vessel or unexpected calcification. Skull: Normal. Negative for fracture or focal lesion. Sinuses/Orbits: No acute finding. Other: None. IMPRESSION: 1.  No acute intracranial pathology. 2. Redemonstrated hypodensity of the mid left frontal lobe (series 2, image 23) and left parietal posterior watershed (series 2, image 18) in keeping with prior infarction. 3. 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 4.  Small-vessel white matter disease. Electronically Signed   By: Eddie Candle M.D.   On: 12/10/2018 14:38      Allergies as of 12/11/2018      Reactions   Atorvastatin Hives   No s/sx of anaphylaxis, just intermittent hives.  Does not seem to be  a class effect as she has tolerated lovastatin previously No s/sx of anaphylaxis, just intermittent hives.  Does not seem to be a class effect as she has tolerated lovastatin previously   Fluconazole Rash   Reaction to generic but not name brand Reaction to generic but not name  brand CAN USE DIFLUCAN.DOES NOT BREAK OUT WITH DIFLUCAN.(allergic only to generic brand)   Sertraline Itching   Sulfa Antibiotics Rash, Other (See Comments)   "hot " sensation   Sulfamethoxazole-trimethoprim Rash, Other (See Comments)   "felt hot" Other reaction(s): Other (See Comments) "felt hot"   Sulfasalazine Other (See Comments), Rash   "hot " sensation      Medication List    STOP taking these medications   potassium chloride SA 20 MEQ tablet Commonly known as:  K-DUR   venlafaxine XR 75 MG 24 hr capsule Commonly known as:  EFFEXOR-XR     TAKE these medications   acetaminophen 325 MG tablet Commonly known as:  TYLENOL Take 1-2 tablets (325-650 mg total) by mouth every 4 (four) hours as needed for mild pain.   aspirin 81 MG chewable tablet Chew 81 mg by mouth daily.   clonazePAM 0.5 MG tablet Commonly known as:  KLONOPIN Take 1 tablet (0.5 mg total) by mouth at bedtime as needed for anxiety.   clopidogrel 75 MG tablet Commonly known as:  PLAVIX Take 1 tablet (75 mg total) by mouth daily.   famotidine 20 MG tablet Commonly known as:  PEPCID Take 1 tablet (20 mg total) by mouth 2 (two) times daily.   fluticasone 50 MCG/ACT nasal spray Commonly known as:  FLONASE Place 2 sprays into both nostrils daily as needed for allergies or rhinitis.   levofloxacin 750 MG tablet Commonly known as:  LEVAQUIN Take 1 tablet (750 mg total) by mouth daily for 5 days.   Melatonin 3 MG Tabs Take 3 mg by mouth at bedtime.   multivitamin-lutein Caps capsule Take 1 capsule by mouth daily.   nitroGLYCERIN 0.4 MG SL tablet Commonly known as:  NITROSTAT Place 0.4 mg under the tongue every 5 (five) minutes as needed for chest pain.   oxybutynin 5 MG tablet Commonly known as:  DITROPAN Take 1 tablet (5 mg total) by mouth 2 (two) times daily.   PARoxetine 30 MG tablet Commonly known as:  PAXIL Take 1 tablet (30 mg total) by mouth daily.   pregabalin 75 MG  capsule Commonly known as:  LYRICA Take 1 capsule (75 mg total) by mouth 2 (two) times daily.   pregabalin 150 MG capsule Commonly known as:  LYRICA Take 1 capsule (150 mg total) by mouth at bedtime.   rosuvastatin 20 MG tablet Commonly known as:  CRESTOR Take 1 tablet (20 mg total) by mouth at bedtime.   senna 8.6 MG Tabs tablet Commonly known as:  SENOKOT Take 2 tablets by mouth at bedtime as needed for mild constipation.   spironolactone 25 MG tablet Commonly known as:  ALDACTONE Take 0.5 tablets (12.5 mg total) by mouth daily.   STOOL SOFTENER PO Take 100 mg by mouth daily.   Vitamin D3 125 MCG (5000 UT) Caps Take 1 capsule (5,000 Units total) by mouth daily.            Durable Medical Equipment  (From admission, onward)         Start     Ordered   12/11/18 1004  DME tub bench  Once     12/11/18 1003   12/11/18  1003  For home use only DME Walker  [facility])  Once    Question:  Patient needs a walker to treat with the following condition  Answer:  Weakness   12/11/18 1003   12/11/18 0000  DME Bedside commode    Question:  Patient needs a bedside commode to treat with the following condition  Answer:  Weakness   12/11/18 1003              Stable for discharge   Patient should follow up with pcp  CODE STATUS:     Code Status Orders  (From admission, onward)         Start     Ordered   12/07/18 [place]  Full code  Continuous     12/07/18 1535        Code Status History    Date Active Date Inactive Code Status Order ID Comments User Context   11/09/2018 1522 11/29/2018 1232 Full Code [id number]  Flora Lipps Inpatient   11/02/2018 0448 11/09/2018 1459 Full Code [id number]  Lance Coon, MD Inpatient   11/13/2017 0804 11/14/2017 1819 Full Code [id number]  Loletha Grayer, MD ED   11/05/2017 1638 11/06/2017 2025 Full Code [id number]  Salary, Avel Peace, MD Inpatient   02/05/2017 0035 02/05/2017 2254 Full Code [id number]  Valinda Party, DO  Inpatient   02/15/2016 0806 02/15/2016 2136 Full Code [id number]  Hillary Bow, MD Inpatient   02/14/2016 1728 02/15/2016 0747 Full Code [id number]  Dustin Flock, MD ED   06/16/2015 1753 06/19/2015 1822 Full Code [id number]  Clayburn Pert, MD Inpatient      TOTAL TIME TAKING CARE OF THIS PATIENT:38  minutes.    Note: This dictation was prepared with Dragon dictation along with smaller phrase technology. Any transcriptional errors that result from this process are unintentional.  Bettey Costa M.D on 12/11/2018 at 12:17 PM  Between 7am to 6pm - Pager - [phone] After 6pm go to [email or web address] - password [email or web address] [place] [facility]  Office  [phone]  CC: Primary care physician; [name], MD

## 2018-12-11 NOTE — Progress Notes (Signed)
Spoke with pt's husband: The patient's husband called and he indicates he is not able to care for his wife on his own if she is discharged home unless she can obtaine 24hr care. He indicates he has had cardiac issues which restricts the care he can provided to his wife.

## 2018-12-11 NOTE — Progress Notes (Signed)
Patient sustained a fall earlier in the day, as reported by off going Therapist, sports.  Behavior, headache, dizziness and  vomiting was monitored Q 2 hours over night. Denies headache,dizziness,no changed in behavior and no vomiting observed. Behavior at baseline. VS per protocol was completed. RN to report off to on coming RN.

## 2018-12-11 NOTE — Progress Notes (Signed)
Patient ID: Destiny [name], female   DOB: 1955-09-16, 63 y.o.   MRN: [id number]   I left message on cell to call back.

## 2018-12-11 NOTE — Telephone Encounter (Signed)
Per Vita Barley 12/11/18 Secure Chat to cancel 12/11/18 MD appt

## 2018-12-11 NOTE — Telephone Encounter (Signed)
Mr Teater called reporting that Ellen fell in hospital yesterday and had a brain scan. They told him to call is for what they found on the scan. This is not a patient of ours and I called 2C and they will contact him

## 2018-12-11 NOTE — TOC Progression Note (Addendum)
Transition of Care [facility]) - Progression Note    Patient Details  Name: Destiny [name] MRN: [id number] Date of Birth: 1955-07-28  Transition of [facility]) CM/SW Contact  Ross Ludwig, [place] Phone Number: 12/11/2018, 3:38 PM  Clinical Narrative:     CSW spoke with patient's husband he does not feel that he can take her home, and he would like SNF placement.  CSW informed him that because she does not have any insurance it may be difficult to place her somwhere.  CSW informed him that a bed search will be started.  CSW has sent patient's information to SNFs in [place], [place], and [place].   Expected Discharge Plan: [place] Barriers to Discharge: Continued Medical Work up  Expected Discharge Plan and Services Expected Discharge Plan: [place]   Discharge Planning Services: CM Consult   Living arrangements for the past 2 months: Apartment Expected Discharge Date: 12/11/18                             Date HH Agency Contacted: 12/07/18 Time HH Agency Contacted: 1430 Representative spoke with at [place]: Philbert Riser of admission)   Social Determinants of Health (SDOH) Interventions    Readmission Risk Interventions No flowsheet data found.

## 2018-12-11 NOTE — Telephone Encounter (Signed)
Patients husband left a message on [place] (office closed) stating his wife was admitted to [facility]. I contacted the patient for details.  He states she had a UTI and pneumonia on top of her stroke dx and cancer dx. Husband is trying to get her discharged to a SNF.  He states they will not make it to Thursdays transitional care appointment with Zella Ball.  I informed April. She has removed appointment. Patients hus band will contact us with update on her status.

## 2018-12-11 NOTE — NC FL2 (Signed)
[place] LEVEL OF CARE SCREENING TOOL     IDENTIFICATION  Patient Name: Destiny [name] Birthdate: [birth year] Sex: female Admission Date (Current Location): 12/07/2018  [place] and [place] Number:  [place] Medicaid Pending number [id number] Facility and Address:  [facility], [address], [place], [place] [address]      Provider Number: [id number]  Attending Physician Name and Address:  Bettey Costa, MD  Relative Name and Phone Number:  Darling, Cieslewicz [phone]      Current Level of Care: Hospital Recommended Level of Care: [place] Prior Approval Number:    Date Approved/Denied:   PASRR Number: [place] Pending  Discharge Plan: SNF    Current Diagnoses: Patient Active Problem List   Diagnosis Date Noted  . HAP (hospital-acquired pneumonia) 12/07/2018  . Acute blood loss anemia   . Chronic systolic congestive heart failure ([place])   . Embolic stroke ([place]) 32/20/2542  . 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 Thrombocytopenia ([place])   . Cerebrovascular accident (CVA) ([place])   . HLD (hyperlipidemia) 11/02/2018  . GERD (gastroesophageal reflux disease) 11/02/2018  . CAD (coronary artery disease) 11/02/2018  . Sleep apnea 11/02/2018  . Hypotension 11/02/2018  . Somnolence 11/02/2018  . Chronic diastolic heart failure ([place]) 11/15/2017  . HTN (hypertension) 11/15/2017  . Hypokalemia 11/05/2017  . Chest pain 02/14/2016  . Small bowel obstruction ([place]) 06/16/2015    Orientation RESPIRATION BLADDER Height & Weight     Self  Normal Incontinent Weight: 194 lb 7.5 oz (88.2 kg) Height:  5\' 2"  (157.5 cm)  BEHAVIORAL SYMPTOMS/MOOD NEUROLOGICAL BOWEL NUTRITION STATUS      Incontinent Diet(Pureed diet)  AMBULATORY STATUS COMMUNICATION OF NEEDS Skin   Limited Assist Verbally Surgical wounds                       Personal Care Assistance Level of Assistance  Bathing, Dressing, Feeding Bathing  Assistance: Limited assistance Feeding assistance: Limited assistance Dressing Assistance: Limited assistance     Functional Limitations Info  Sight, Hearing, Speech Sight Info: Adequate Hearing Info: Adequate Speech Info: Adequate    SPECIAL CARE FACTORS FREQUENCY  PT (By licensed PT), OT (By licensed OT)     PT Frequency: Minimum 5x a week OT Frequency: Minimum 5x a week            Contractures Contractures Info: Not present    Additional Factors Info  Code Status, Allergies Code Status Info: Full Code Allergies Info: ATORVASTATIN, FLUCONAZOLE, SERTRALINE, SULFA ANTIBIOTICS, SULFAMETHOXAZOLE-TRIMETHOPRIM, SULFASALAZINE            Current Medications (12/11/2018):  This is the current hospital active medication list Current Facility-Administered Medications  Medication Dose Route Frequency Provider Last Rate Last Dose  . 0.9 %  sodium chloride infusion   Intravenous PRN Demetrios Loll, MD   Stopped at 12/10/18 1215  . 0.9 %  sodium chloride infusion   Intravenous Continuous Demetrios Loll, MD 75 mL/hr at 12/11/18 0600    . acetaminophen (TYLENOL) tablet 650 mg  650 mg Oral Q6H PRN Demetrios Loll, MD       Or  . acetaminophen (TYLENOL) suppository 650 mg  650 mg Rectal Q6H PRN Demetrios Loll, MD      . albuterol (PROVENTIL) (2.5 MG/3ML) 0.083% nebulizer solution 2.5 mg  2.5 mg Nebulization Q2H PRN Demetrios Loll, MD      . aspirin chewable tablet 81 mg  81 mg Oral Daily Demetrios Loll, MD  81 mg at 12/11/18 1018  . bisacodyl (DULCOLAX) EC tablet 5 mg  5 mg Oral Daily PRN Demetrios Loll, MD   5 mg at 12/10/18 0815  . Chlorhexidine Gluconate Cloth 2 % PADS 6 each  6 each Topical Q0600 Bettey Costa, MD   6 each at 12/11/18 1032  . cholecalciferol (VITAMIN D3) tablet 5,000 Units  5,000 Units Oral Daily Demetrios Loll, MD   5,000 Units at 12/11/18 1026  . clonazePAM (KLONOPIN) tablet 0.5 mg  0.5 mg Oral QHS PRN Demetrios Loll, MD      . clopidogrel (PLAVIX) tablet 75 mg  75 mg Oral Daily Demetrios Loll, MD   75 mg  at 12/11/18 1020  . docusate sodium (COLACE) capsule 100 mg  100 mg Oral Daily Demetrios Loll, MD   Stopped at 12/11/18 1035  . enoxaparin (LOVENOX) injection 40 mg  40 mg Subcutaneous Q24H Demetrios Loll, MD   40 mg at 12/10/18 2256  . famotidine (PEPCID) tablet 20 mg  20 mg Oral BID Demetrios Loll, MD   20 mg at 12/11/18 1020  . feeding supplement (BOOST / RESOURCE BREEZE) liquid 1 Container  1 Container Oral TID BM Demetrios Loll, MD   1 Container at 12/11/18 1410  . fluticasone (FLONASE) 50 MCG/ACT nasal spray 2 spray  2 spray Each Nare Daily PRN Demetrios Loll, MD      . heparin flush 10 UNIT/ML injection 10 Units  10 Units Intracatheter Once Dallie Piles, [facility]      . HYDROcodone-acetaminophen (NORCO/VICODIN) 5-325 MG per tablet 1 tablet  1 tablet Oral Q4H PRN Demetrios Loll, MD      . levofloxacin [facility]) tablet 750 mg  750 mg Oral Daily Demetrios Loll, MD   750 mg at 12/11/18 1021  . Melatonin TABS 5 mg  5 mg Oral QHS Demetrios Loll, MD   5 mg at 12/08/18 2152  . metoprolol tartrate (LOPRESSOR) injection 5 mg  5 mg Intravenous Q4H PRN Bettey Costa, MD   5 mg at 12/11/18 1613  . multivitamin-lutein (OCUVITE-LUTEIN) capsule 1 capsule  1 capsule Oral Daily Demetrios Loll, MD   1 capsule at 12/11/18 1021  . mupirocin ointment (BACTROBAN) 2 % 1 application  1 application Nasal BID Bettey Costa, MD   1 application at 17/49/44 1035  . nitroGLYCERIN (NITROSTAT) SL tablet 0.4 mg  0.4 mg Sublingual Q5 min PRN Demetrios Loll, MD      . ondansetron [facility]) tablet 4 mg  4 mg Oral Q6H PRN Demetrios Loll, MD       Or  . ondansetron [facility]) injection 4 mg  4 mg Intravenous Q6H PRN Demetrios Loll, MD      . oxybutynin (DITROPAN) tablet 5 mg  5 mg Oral BID Demetrios Loll, MD   5 mg at 12/11/18 1018  . PARoxetine (PAXIL) tablet 30 mg  30 mg Oral Daily Demetrios Loll, MD   30 mg at 12/11/18 1021  . potassium chloride SA (K-DUR) CR tablet 20 mEq  20 mEq Oral Daily Demetrios Loll, MD   20 mEq at 12/11/18 1018  . pregabalin (LYRICA) capsule 150 mg  150 mg Oral QHS  Demetrios Loll, MD   150 mg at 12/10/18 2255  . pregabalin (LYRICA) capsule 75 mg  75 mg Oral BID Demetrios Loll, MD   75 mg at 12/11/18 1018  . rosuvastatin (CRESTOR) tablet 20 mg  20 mg Oral QHS Demetrios Loll, MD   20 mg at 12/10/18 2256  . senna-docusate (Senokot-S)  tablet 1 tablet  1 tablet Oral QHS PRN Demetrios Loll, MD      . spironolactone (ALDACTONE) tablet 12.5 mg  12.5 mg Oral Daily Demetrios Loll, MD   12.5 mg at 12/11/18 1019     Discharge Medications: Please see discharge summary for a list of discharge medications.  Relevant Imaging Results:  Relevant Lab Results:   Additional Information SSN [id number]  Ross Ludwig, LCSW

## 2018-12-12 ENCOUNTER — Ambulatory Visit: Payer: Self-pay | Admitting: Oncology

## 2018-12-12 ENCOUNTER — Inpatient Hospital Stay: Payer: Medicaid Other | Admitting: Oncology

## 2018-12-12 LAB — CULTURE, BLOOD (ROUTINE X 2)
Culture: NO GROWTH
Culture: NO GROWTH
Special Requests: ADEQUATE

## 2018-12-12 LAB — SARS CORONAVIRUS 2 BY RT PCR (HOSPITAL ORDER, PERFORMED IN ~~LOC~~ HOSPITAL LAB): SARS Coronavirus 2: NEGATIVE

## 2018-12-12 MED ORDER — DOCUSATE SODIUM 50 MG/5ML PO LIQD
100.0000 mg | Freq: Two times a day (BID) | ORAL | Status: DC
  Administered 2018-12-12 – 2018-12-13 (×2): 100 mg via ORAL
  Filled 2018-12-12 (×4): qty 10

## 2018-12-12 NOTE — TOC Progression Note (Addendum)
Transition of Care [facility]) - Progression Note    Patient Details  Name: Destiny [name] MRN: [id number] Date of Birth: Dec 01, 1955  Transition of Care [facility]) CM/SW Contact  Ross Ludwig, [place] Phone Number: 12/12/2018, 10:16 AM  Clinical Narrative:    CSW was contacted by [facility] admissions worker.  They are reviewing patient's information and may be able to accept her.  CSW awaiting for confirmation from SNF.  SNF also requested updated PT note from today.  PT saw patient and recommended SNF, CSW updated SNF, SNF requested a new covid test, which came back negative.  CSW updated SNF awaiting for call back from SNF.  4:07pm  CSW Spoke to [facility] admissions worker Ebony Hail [phone], she is still waiting to hear back from corporate about acceptance of patient.  Per admissions worker, she may not have an answer till tomorrow morning.     Expected Discharge Plan: [place] Barriers to Discharge: Continued Medical Work up  Expected Discharge Plan and Services Expected Discharge Plan: [place]   Discharge Planning Services: CM Consult   Living arrangements for the past 2 months: Apartment Expected Discharge Date: 12/12/18                             Date HH Agency Contacted: 12/07/18 Time HH Agency Contacted: 1430 Representative spoke with at [address]: Philbert Riser of admission)   Social Determinants of Health (SDOH) Interventions    Readmission Risk Interventions No flowsheet data found.

## 2018-12-12 NOTE — Progress Notes (Signed)
MD notified: Can you place another order for a rapid covid testing for this patient. A SNF needs a new covid tests result within the last 24hrs in order to accept a patient. Eric from social work is hoping she can go today maybe if bed gets approved. You can type SARS and add the first order in the list.

## 2018-12-12 NOTE — Progress Notes (Addendum)
MD notified: It seems like the patient will most likely be discharged tomorrow. Negative for covid. She did have an old abdominal incision which has one spot towards the upper epigastric region that has a small opening yet it started to drain. I wanted to notify you incase you had not seen it as it was dry this morning but after PT saw the patient was moving it started to drain. The patient had a large bowel movement.  The patient's husband called 4 times today for and update. He is well informed about the plan of care and possible discharged of the patient to a SNF tomorrow if things get approved.

## 2018-12-12 NOTE — Progress Notes (Signed)
Physical Therapy Treatment Patient Details Name: Destiny [name] MRN: [id number] DOB: [birth year] Today's Date: 12/12/2018    History of Present Illness Cecil Bixby is a 63yo F who comes [facility] on 5[facility] c fever, oted to have UTI/PNA. Pt just recently DC from [facility] 4/25-5/13 in [place]. PMH: CAD, CHF, CVA c Left hemiplegia, HTN, HOLD, ovarian CA, OSA, GERD.     PT Comments    Patient agreeable to PT, requesting to utilize [facility]. Pt able to initiate supine to sit, minAx2 to completely transfer to EOB. EOB balance progressed from modA-CGA, feet supported. Sit <> stand with RW and minA. Pt able to take small shuffled steps towards BSC, step by step sequencing and assistance for RW management. Decreased L foot clearance noted and increased time needed for processing/initiating task. Pt sat on BSC with intermittent feet supported/unilateral support with supervision, minA assistance needed to reposition on commode. Overall the patient demonstrated progress towards goals and improvement in functional mobility. Further mobility held due to pt requesting extended time on [facility], with CNA at end of session. Pt discharge recommendation of STR still appropriate to maximize mobility, independence, and safety.     Follow Up Recommendations  SNF     Equipment Recommendations  None recommended by PT    Recommendations for Other Services       Precautions / Restrictions Precautions Precautions: Fall Restrictions Weight Bearing Restrictions: No    Mobility  Bed Mobility Overal bed mobility: Needs Assistance Bed Mobility: Supine to Sit     Supine to sit: +2 for physical assistance;Mod assist     General bed mobility comments: use of bed rails, increased time to allow pt understanding  Transfers Overall transfer level: Needs assistance Equipment used: Rolling walker (2 wheeled) Transfers: Sit to/from Stand Sit to Stand: From elevated surface;Min assist;+2 physical assistance             Ambulation/Gait Ambulation/Gait assistance: Min assist;+2 physical assistance Gait Distance (Feet): 2 Feet Assistive device: Rolling walker (2 wheeled)       General Gait Details: Pt able to take small steps with verbal cues and assistance with RW. Decreased L fot clearance noted. no overt LOB noted   Stairs             Wheelchair Mobility    Modified Rankin (Stroke Patients Only)       Balance Overall balance assessment: Needs assistance Sitting-balance support: Feet supported;No upper extremity supported Sitting balance-Leahy Scale: Fair     Standing balance support: Bilateral upper extremity supported Standing balance-Leahy Scale: Poor                              Cognition Arousal/Alertness: Awake/alert Behavior During Therapy: Flat affect Overall Cognitive Status: Impaired/Different from baseline                                 General Comments: some improvement noted in processing and response this session though still delayed      Exercises Other Exercises Other Exercises: Pt transferred to [facility] with minAx2 and RW. Pt with CNA at end of session Other Exercises: Able to sit on [facility] with supervision, intermittent feet supported/unilateral support with use of rails    General Comments        Pertinent Vitals/Pain Pain Assessment: Faces Faces Pain Scale: Hurts a little bit Pain Location: sitting on commode, buttocks Pain  Descriptors / Indicators: Grimacing;Guarding Pain Intervention(s): Limited activity within patient's tolerance;Monitored during session    Home Living                      Prior Function            PT Goals (current goals can now be found in the care plan section) Progress towards PT goals: Progressing toward goals(mild progress towards goals)    Frequency    Min 2X/week      PT Plan Current plan remains appropriate    Co-evaluation              AM-PAC PT "6 Clicks"  Mobility   Outcome Measure  Help needed turning from your back to your side while in a flat bed without using bedrails?: A Lot Help needed moving from lying on your back to sitting on the side of a flat bed without using bedrails?: A Lot Help needed moving to and from a bed to a chair (including a wheelchair)?: A Lot Help needed standing up from a chair using your arms (e.g., wheelchair or bedside chair)?: A Lot Help needed to walk in hospital room?: A Lot Help needed climbing 3-5 steps with a railing? : Total 6 Click Score: 11    End of Session Equipment Utilized During Treatment: Gait belt Activity Tolerance: Patient limited by fatigue Patient left: with nursing/sitter in room;Other (comment)(on BSC with CNA) Nurse Communication: Mobility status PT Visit Diagnosis: Muscle weakness (generalized) (M62.81);Difficulty in walking, not elsewhere classified (R26.2);Unsteadiness on feet (R26.81);Other symptoms and signs involving the nervous system (R29.898)     Time: 1119-1140 PT Time Calculation (min) (ACUTE ONLY): 21 min  Charges:  $Therapeutic Exercise: 8-22 mins                     Lieutenant Diego PT, DPT 11:50 AM,12/12/18 [phone]

## 2018-12-12 NOTE — Progress Notes (Addendum)
[place] [facility] [place] NAME: Destiny [name]    MR#:  [id number]  DATE OF BIRTH:  11/07/1955  SUBJECTIVE:  No issues overnight  REVIEW OF SYSTEMS:    Confused this am at baseline it seems  Tolerating Diet: yes      DRUG ALLERGIES:   Allergies  Allergen Reactions  . Atorvastatin Hives    No s/sx of anaphylaxis, just intermittent hives.  Does not seem to be a class effect as she has tolerated lovastatin previously No s/sx of anaphylaxis, just intermittent hives.  Does not seem to be a class effect as she has tolerated lovastatin previously  . Fluconazole Rash    Reaction to generic but not name brand Reaction to generic but not name brand CAN USE DIFLUCAN.DOES NOT BREAK OUT WITH DIFLUCAN.(allergic only to generic brand)  . Sertraline Itching  . Sulfa Antibiotics Rash and Other (See Comments)    "hot " sensation  . Sulfamethoxazole-Trimethoprim Rash and Other (See Comments)    "felt hot" Other reaction(s): Other (See Comments) "felt hot"  . Sulfasalazine Other (See Comments) and Rash    "hot " sensation    VITALS:  Blood pressure 113/80, pulse 92, temperature 97.9 F (36.6 C), temperature source Oral, resp. rate 20, height 5\' 2"  (1.575 m), weight 88.2 kg, SpO2 100 %.  PHYSICAL EXAMINATION:  GENERAL:  63 y.o.-year-old patient lying in the bed with no acute distress.  NECK:  Supple, no jugular venous distention. No thyroid enlargement, no tenderness.  LUNGS: Normal breath sounds bilaterally, no wheezing, rales,rhonchi  No use of accessory muscles of respiration.  CARDIOVASCULAR: S1, S2 normal. No murmurs, rubs, or gallops.  ABDOMEN: Soft, non-tender, non-distended. Bowel sounds present. No organomegaly or mass.  EXTREMITIES: No pedal edema, cyanosis, or clubbing.  PSYCHIATRIC: The patient is alert and oriented x name confused at baseline  SKIN: No obvious rash, lesion, or ulcer     LABORATORY PANEL:   CBC Recent Labs   Lab 12/08/18 0606 12/09/18 0508  WBC 6.8  --   HGB 7.5* 7.5*  HCT 24.2*  --   PLT 160  --    ------------------------------------------------------------------------------------------------------------------  Chemistries  Recent Labs  Lab 12/07/18 1058  12/10/18 0429 12/11/18 0315  NA 139   < >  --  140  K 3.7   < >  --  3.6  CL 108   < >  --  105  CO2 22   < >  --  25  GLUCOSE 112*   < >  --  110*  BUN 8   < >  --  8  CREATININE 0.70   < >  --  0.66  CALCIUM 9.4   < >  --  9.4  MG  --    < > 2.2  --   AST 33  --   --   --   ALT 28  --   --   --   ALKPHOS 65  --   --   --   BILITOT 0.5  --   --   --    < > = values in this interval not displayed.   ------------------------------------------------------------------------------------------------------------------  Cardiac Enzymes Recent Labs  Lab 12/07/18 1058  TROPONINI <0.03   ------------------------------------------------------------------------------------------------------------------  RADIOLOGY:  Ct Head Wo Contrast  Result Date: 12/10/2018 CLINICAL DATA:  Head trauma, neurological decline, acute metabolic encephalopathy, follow-up infarction EXAM: CT HEAD WITHOUT CONTRAST TECHNIQUE: Contiguous axial images were obtained from  the base of the skull through the vertex without intravenous contrast. COMPARISON:  CT brain, MR brain, 11/04/2018 FINDINGS: Brain: Redemonstrated hypodensity of the mid left frontal lobe (series 2, image 23) and left parietal posterior watershed (series 2, image 18). 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 Incidental note of cavum septum pellucidum et vergae variant of the lateral ventricles. Mild underlying periventricular white matter hypodensity. Vascular: No hyperdense vessel or unexpected calcification. Skull: Normal. Negative for fracture or focal lesion. Sinuses/Orbits: No acute finding. Other: None. IMPRESSION: 1.   No acute intracranial pathology. 2. Redemonstrated hypodensity of the mid left frontal lobe (series 2, image 23) and left parietal posterior watershed (series 2, image 18) in keeping with prior infarction. 3. 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 4.  Small-vessel white matter disease. Electronically Signed   By: Eddie Candle M.D.   On: 12/10/2018 14:38     ASSESSMENT AND PLAN:   63 year old female with a history of CAD and chronic diastolic heart failure with recent hospitalization due to acute CVA who presented from home due to fever and confusion. 1.  Acute metabolic encephalopathy in the setting of healthcare-acquired pneumonia as well as urinary tract infection along with recent CVA: Patient appears to be at baseline  2.HCAP (RLL): Patient transition to oral antibiotics. Patient can be treated through Sunday. 3.  Klebsiella urinary tract infection: Continue Levaquin.  4.  Recent acute CVA: Continue aspirin, Plavix and statin 5.  Chronic diastolic heart failure without signs of exacerbation 6.  Depression: Continue Paxil 7.  Essential hypertension: Continue Aldactone      Management plans discussed with the patient and husband and they are in agreement.  CODE STATUS: full  TOTAL TIME TAKING CARE OF THIS PATIENT: 22 minutes.     POSSIBLE D/C today, DEPENDING ON CLINICAL CONDITION.   Bettey Costa M.D on 12/12/2018 at 9:21 AM  Between 7am to 6pm - Pager - [phone] After 6pm go to [email or web address] - password [email or web address] [place] [facility]  Office  [phone]  CC: Primary care physician; [name], MD  Note: This dictation was prepared with Dragon dictation along with smaller phrase technology. Any transcriptional errors that result from this process are unintentional.

## 2018-12-13 ENCOUNTER — Encounter: Payer: Medicaid Other | Admitting: Registered Nurse

## 2018-12-13 MED ORDER — PREGABALIN 75 MG PO CAPS: 75.0000 mg | ORAL_CAPSULE | Freq: Two times a day (BID) | ORAL | 0 refills | Status: AC

## 2018-12-13 NOTE — Progress Notes (Signed)
[place] [facility] [place] NAME: Destiny [name]    MR#:  [id number]  DATE OF BIRTH:  12/19/1955  SUBJECTIVE:  At baseline today no issues overnight  REVIEW OF SYSTEMS:    Confused this am at baseline it seems  Tolerating Diet: yes      DRUG ALLERGIES:   Allergies  Allergen Reactions  . Atorvastatin Hives    No s/sx of anaphylaxis, just intermittent hives.  Does not seem to be a class effect as she has tolerated lovastatin previously No s/sx of anaphylaxis, just intermittent hives.  Does not seem to be a class effect as she has tolerated lovastatin previously  . Fluconazole Rash    Reaction to generic but not name brand Reaction to generic but not name brand CAN USE DIFLUCAN.DOES NOT BREAK OUT WITH DIFLUCAN.(allergic only to generic brand)  . Sertraline Itching  . Sulfa Antibiotics Rash and Other (See Comments)    "hot " sensation  . Sulfamethoxazole-Trimethoprim Rash and Other (See Comments)    "felt hot" Other reaction(s): Other (See Comments) "felt hot"  . Sulfasalazine Other (See Comments) and Rash    "hot " sensation    VITALS:  Blood pressure 110/82, pulse 95, temperature 98.3 F (36.8 C), temperature source Oral, resp. rate 20, height 5\' 2"  (1.575 m), weight 88.2 kg, SpO2 100 %.  PHYSICAL EXAMINATION:  GENERAL:  63 y.o.-year-old patient lying in the bed with no acute distress.  NECK:  Supple, no jugular venous distention. No thyroid enlargement, no tenderness.  LUNGS: Normal breath sounds bilaterally, no wheezing, rales,rhonchi  No use of accessory muscles of respiration.  CARDIOVASCULAR: S1, S2 normal. No murmurs, rubs, or gallops.  ABDOMEN: Soft, non-tender, non-distended. Bowel sounds present. No organomegaly or mass.  EXTREMITIES: No pedal edema, cyanosis, or clubbing.  PSYCHIATRIC: The patient is alert and oriented x name confused at baseline  SKIN: No obvious rash, lesion, or ulcer     LABORATORY PANEL:    CBC Recent Labs  Lab 12/08/18 0606 12/09/18 0508  WBC 6.8  --   HGB 7.5* 7.5*  HCT 24.2*  --   PLT 160  --    ------------------------------------------------------------------------------------------------------------------  Chemistries  Recent Labs  Lab 12/07/18 1058  12/10/18 0429 12/11/18 0315  NA 139   < >  --  140  K 3.7   < >  --  3.6  CL 108   < >  --  105  CO2 22   < >  --  25  GLUCOSE 112*   < >  --  110*  BUN 8   < >  --  8  CREATININE 0.70   < >  --  0.66  CALCIUM 9.4   < >  --  9.4  MG  --    < > 2.2  --   AST 33  --   --   --   ALT 28  --   --   --   ALKPHOS 65  --   --   --   BILITOT 0.5  --   --   --    < > = values in this interval not displayed.   ------------------------------------------------------------------------------------------------------------------  Cardiac Enzymes Recent Labs  Lab 12/07/18 1058  TROPONINI <0.03   ------------------------------------------------------------------------------------------------------------------  RADIOLOGY:  No results found.   ASSESSMENT AND PLAN:   63 year old female with a history of CAD and chronic diastolic heart failure with recent hospitalization due to acute CVA  who presented from home due to fever and confusion. 1.  Acute metabolic encephalopathy in the setting of healthcare-acquired pneumonia as well as urinary tract infection along with recent CVA: Patient appears to be at baseline  2.HCAP (RLL): Patient transition to oral antibiotics. Patient can be treated through Sunday. 3.  Klebsiella urinary tract infection: Continue Levaquin.  4.  Recent acute CVA: Continue aspirin, Plavix and statin 5.  Chronic diastolic heart failure without signs of exacerbation 6.  Depression: Continue Paxil 7.  Essential hypertension: Continue Aldactone    Management plans discussed with the patient and husband and they are in agreement.  CODE STATUS: full  TOTAL TIME TAKING CARE OF THIS PATIENT:  24 minutes.    D/w husband POSSIBLE D/C today, DEPENDING ON CLINICAL CONDITION.   Bettey Costa M.D on 12/13/2018 at 9:24 AM  Between 7am to 6pm - Pager - [phone] After 6pm go to [email or web address] - password [email or web address] [facility]  Office  [phone]  CC: Primary care physician; [name], MD  Note: This dictation was prepared with Dragon dictation along with smaller phrase technology. Any transcriptional errors that result from this process are unintentional.

## 2018-12-13 NOTE — Consult Note (Signed)
[place] Nurse wound consult note, remote consultation performed with assistance of bedside nurse Reason for Consult: open abdominal wound. Patient EMR reviewed history of abdominal surgery and abdominal wall debridements at [facility] Wound type: non healing surgical wound Pressure Injury POA:/NA Measurement: 2cm x 1cm x 3cm tunnel at 12 o'clock  Wound bed: bedside nurse does not notice necrotic tissue. Essentially full thickness non healing portion of her otherwise fully healed midline incision.  Drainage (amount, consistency, odor)  Yellow, no odor, moderate, not consistent with fistula like material. Does not have color or odor of succus   Periwound: intact   Dressing procedure/placement/frequency:  1/4" plain packing strip to wick drainage to gauze topper dressing. Secure with tape. Change daily.   Follow up for non healing abdominal wound with [facility]   Discussed POC with bedside nurse.  Re consult if needed, will not follow at this time. Thanks  Raffaele Derise [employer], RN,CWOCN, CNS, [place] [phone])

## 2018-12-13 NOTE — TOC Transition Note (Addendum)
Transition of Care [facility]) - CM/SW Discharge Note   Patient Details  Name: Destiny [name] MRN: [id number] Date of Birth: July 03, 1956  Transition of Care [facility]) CM/SW Contact:  Ross Ludwig, LCSW Phone Number: 12/13/2018, 4:26 PM   Clinical Narrative:     CSW received phone call from [facility], that they have agreed to accept patient.  CSW presented bed offers to husband and he is agreeable to having patient go to SNF for short term rehab.  [facility] emailed admission paperwork to this CSW, CSW met patient's husband who completed the paperwork, CSW emailed it back to admissions worker at [facility].  [facility] accepted patient with a two week LOG for room and board and PT and OT.  Passar number received, 30 day Passar [id number] E expires 01/12/19.  Patient to be d/c'ed today to [facility] room 129A.  Patient and family agreeable to plans will transport via ems RN to call report to [phone].  CSW updated patient's husband Kasandra Knudsen, he is aware that patient is discharging today.  Final next level of care: Skilled Nursing Facility Barriers to Discharge: Barriers Resolved   Patient Goals and CMS Choice Patient states their goals for this hospitalization and ongoing recovery are:: Patient's husband hopes patient will get some rehab and then return back home. CMS Medicare.gov Compare Post Acute Care list provided to:: Patient Represenative (must comment) Choice offered to / list presented to : Spouse  Discharge Placement PASRR number recieved: 12/13/18            Patient chooses bed at: Other - please specify in the comment section below:([facility]) Patient to be transferred to facility by: [facility] Name of family member notified: Patient's husband, [phone] Patient and family notified of of transfer: 12/13/18  Discharge Plan and Services   Discharge Planning Services: CM Consult            DME Arranged: N/A DME Agency:  NA       HH Arranged: NA HH Agency: NA Date HH Agency Contacted: 12/07/18 Time HH Agency Contacted: 1430 Representative spoke with at [place]: Philbert Riser of admission)  Social Determinants of Health (SDOH) Interventions     Readmission Risk Interventions Readmission Risk Prevention Plan 12/13/2018  Transportation Screening Complete  Medication Review Press photographer) Complete  PCP or Specialist appointment within 3-5 days of discharge Complete  HRI or Home Care Consult Complete  SW Recovery Care/Counseling Consult Complete  Palliative Care Screening Complete  Skilled Nursing Facility Complete  Some recent data might be hidden

## 2018-12-13 NOTE — Progress Notes (Signed)
Pt transported via EMS to [facility]

## 2018-12-13 NOTE — Progress Notes (Signed)
Report called to [facility] at [facility]. EMS called for transport.

## 2018-12-14 ENCOUNTER — Encounter (HOSPITAL_COMMUNITY): Payer: Self-pay | Admitting: *Deleted

## 2018-12-14 ENCOUNTER — Emergency Department (HOSPITAL_COMMUNITY): Payer: Medicaid Other

## 2018-12-14 ENCOUNTER — Emergency Department (HOSPITAL_COMMUNITY)
Admission: EM | Admit: 2018-12-14 | Discharge: 2018-12-14 | Disposition: A | Discharge status: Home / Self Care | Payer: Medicaid Other | Attending: Emergency Medicine | Admitting: Emergency Medicine

## 2018-12-14 ENCOUNTER — Other Ambulatory Visit: Payer: Self-pay

## 2018-12-14 DIAGNOSIS — R51 Headache: Secondary | ICD-10-CM | POA: Diagnosis present

## 2018-12-14 DIAGNOSIS — Z79899 Other long term (current) drug therapy: Secondary | ICD-10-CM | POA: Insufficient documentation

## 2018-12-14 DIAGNOSIS — W19XXXA Unspecified fall, initial encounter: Secondary | ICD-10-CM

## 2018-12-14 DIAGNOSIS — Z7982 Long term (current) use of aspirin: Secondary | ICD-10-CM | POA: Diagnosis not present

## 2018-12-14 DIAGNOSIS — Y92122 Bedroom in nursing home as the place of occurrence of the external cause: Secondary | ICD-10-CM | POA: Insufficient documentation

## 2018-12-14 DIAGNOSIS — W1830XA Fall on same level, unspecified, initial encounter: Secondary | ICD-10-CM | POA: Diagnosis not present

## 2018-12-14 DIAGNOSIS — I2510 Atherosclerotic heart disease of native coronary artery without angina pectoris: Secondary | ICD-10-CM | POA: Diagnosis not present

## 2018-12-14 DIAGNOSIS — I5022 Chronic systolic (congestive) heart failure: Secondary | ICD-10-CM | POA: Insufficient documentation

## 2018-12-14 DIAGNOSIS — I10 Essential (primary) hypertension: Secondary | ICD-10-CM | POA: Diagnosis not present

## 2018-12-14 DIAGNOSIS — Z8543 Personal history of malignant neoplasm of ovary: Secondary | ICD-10-CM | POA: Insufficient documentation

## 2018-12-14 DIAGNOSIS — Y999 Unspecified external cause status: Secondary | ICD-10-CM | POA: Diagnosis not present

## 2018-12-14 DIAGNOSIS — Y939 Activity, unspecified: Secondary | ICD-10-CM | POA: Diagnosis not present

## 2018-12-14 DIAGNOSIS — M25512 Pain in left shoulder: Secondary | ICD-10-CM | POA: Diagnosis not present

## 2018-12-14 DIAGNOSIS — Z7901 Long term (current) use of anticoagulants: Secondary | ICD-10-CM | POA: Insufficient documentation

## 2018-12-14 NOTE — ED Notes (Signed)
PTAR given d/c paperwork and transport paperwork.

## 2018-12-14 NOTE — ED Notes (Signed)
Destiny [name], Network engineer, to call PTAR to [employer]

## 2018-12-14 NOTE — Discharge Instructions (Signed)
Return to the hospital for any new or worsening symptoms

## 2018-12-14 NOTE — ED Notes (Signed)
Called ptar for pt  

## 2018-12-14 NOTE — ED Triage Notes (Signed)
Pt from [place] where she has recently admitted for rehad following a stroke. Pt was trying to transfer with walker to bed and fell onto floor. Hematoma noted to back of head, c-collar in place by EMS. Pt c/o R shoulder pain; denies LOC. Vitals 138/98, pulse 110, SPO2 95% RA. Pt A&Ox3 on arrival

## 2018-12-14 NOTE — ED Provider Notes (Signed)
[place] EMERGENCY DEPARTMENT Provider Note   CSN: [id number] Arrival date & time: 12/14/18  2026    History   Chief Complaint Chief Complaint  Patient presents with  . Fall    HPI Destiny [name] is a 63 y.o. female who was sent in from SNF for fall.  Patient states that she was walking with her walker and she reached over to hold onto the bed to transition and she states "next thing I know I was holding the floor."  He had an apparent mechanical fall and complains of pain in her left shoulder and the left side of her head.  She denies loss of consciousness.  The fall was apparently mechanical.     HPI  Past Medical History:  Diagnosis Date  . CAD (coronary artery disease)   . CHF (congestive heart failure) ([place])   . CVA (cerebral infarction)   . Female bladder prolapse   . GERD (gastroesophageal reflux disease)   . Hyperlipidemia   . Hypertension   . Hypokalemia   . Mitral valve disease   . Ovarian ca ([place])   . Sleep apnea   . Stroke [facility])    x3    Patient Active Problem List   Diagnosis Date Noted  . HAP (hospital-acquired pneumonia) 12/07/2018  . Acute blood loss anemia   . Chronic systolic congestive heart failure ([place])   . Embolic stroke ([place]) 18/84/1660  . 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 Thrombocytopenia ([place])   . Cerebrovascular accident (CVA) ([place])   . HLD (hyperlipidemia) 11/02/2018  . GERD (gastroesophageal reflux disease) 11/02/2018  . CAD (coronary artery disease) 11/02/2018  . Sleep apnea 11/02/2018  . Hypotension 11/02/2018  . Somnolence 11/02/2018  . Chronic diastolic heart failure ([place]) 11/15/2017  . HTN (hypertension) 11/15/2017  . Hypokalemia 11/05/2017  . Chest pain 02/14/2016  . Small bowel obstruction ([place]) 06/16/2015    Past Surgical History:  Procedure Laterality Date  . CARPAL TUNNEL RELEASE Bilateral   . CATARACT EXTRACTION Right 11/22/2017  . CHOLECYSTECTOMY    . COLON SURGERY    . TENNIS  ELBOW RELEASE/NIRSCHEL PROCEDURE Left   . TONSILLECTOMY       OB History   No obstetric history on file.      Home Medications    Prior to Admission medications   Medication Sig Start Date End Date Taking? Authorizing Provider  acetaminophen (TYLENOL) 325 MG tablet Take 1-2 tablets (325-650 mg total) by mouth every 4 (four) hours as needed for mild pain. 11/28/18   Angiulli, Lavon Paganini, PA-C  aspirin 81 MG chewable tablet Chew 81 mg by mouth daily.    [provider]  Cholecalciferol (VITAMIN D3) 125 MCG (5000 UT) CAPS Take 1 capsule (5,000 Units total) by mouth daily. 11/28/18   Angiulli, Lavon Paganini, PA-C  clonazePAM (KLONOPIN) 0.5 MG tablet Take 1 tablet (0.5 mg total) by mouth at bedtime as needed for anxiety. 11/28/18   Angiulli, Lavon Paganini, PA-C  clopidogrel (PLAVIX) 75 MG tablet Take 1 tablet (75 mg total) by mouth daily. 11/28/18   Angiulli, Lavon Paganini, PA-C  Docusate Calcium (STOOL SOFTENER PO) Take 100 mg by mouth daily.     [provider]  famotidine (PEPCID) 20 MG tablet Take 1 tablet (20 mg total) by mouth 2 (two) times daily. 11/28/18   Angiulli, Lavon Paganini, PA-C  fluticasone (FLONASE) 50 MCG/ACT nasal spray Place 2 sprays into both nostrils daily as needed for allergies or rhinitis.  [provider]  levofloxacin (LEVAQUIN) 750 MG tablet Take 1 tablet (750 mg total) by mouth daily for 5 days. 12/11/18 12/16/18  Bettey Costa, MD  Melatonin 3 MG TABS Take 3 mg by mouth at bedtime.    [provider]  multivitamin-lutein (OCUVITE-LUTEIN) CAPS capsule Take 1 capsule by mouth daily. 11/10/18   Gladstone Lighter, MD  nitroGLYCERIN (NITROSTAT) 0.4 MG SL tablet Place 0.4 mg under the tongue every 5 (five) minutes as needed for chest pain.    [provider]  oxybutynin (DITROPAN) 5 MG tablet Take 1 tablet (5 mg total) by mouth 2 (two) times daily. 11/28/18   Angiulli, Lavon Paganini, PA-C  PARoxetine (PAXIL) 30 MG tablet Take 1 tablet (30 mg total) by mouth  daily. 11/28/18   Angiulli, Lavon Paganini, PA-C  pregabalin (LYRICA) 150 MG capsule Take 1 capsule (150 mg total) by mouth at bedtime. 11/28/18   Angiulli, Lavon Paganini, PA-C  pregabalin (LYRICA) 75 MG capsule Take 1 capsule (75 mg total) by mouth 2 (two) times daily. 12/13/18   Mayo, Pete Pelt, MD  rosuvastatin (CRESTOR) 20 MG tablet Take 1 tablet (20 mg total) by mouth at bedtime. 11/28/18   Angiulli, Lavon Paganini, PA-C  senna (SENOKOT) 8.6 MG TABS tablet Take 2 tablets by mouth at bedtime as needed for mild constipation.    [provider]  spironolactone (ALDACTONE) 25 MG tablet Take 0.5 tablets (12.5 mg total) by mouth daily. 11/28/18 11/28/19  Angiulli, Lavon Paganini, PA-C    Family History Family History  Problem Relation Age of Onset  . Heart disease Other   . Hypertension Other   . Rheum arthritis Mother   . CAD Father     Social History Social History   Tobacco Use  . Smoking status: Never Smoker  . Smokeless tobacco: Never Used  Substance Use Topics  . Alcohol use: No    Alcohol/week: 0.0 standard drinks  . Drug use: No     Allergies   Atorvastatin; Fluconazole; Sertraline; Sulfa antibiotics; Sulfamethoxazole-trimethoprim; and Sulfasalazine   Review of Systems Review of Systems  Ten systems reviewed and are negative for acute change, except as noted in the HPI.   Physical Exam Updated Vital Signs BP (!) 118/95   Pulse (!) 111   Temp 98.1 F (36.7 C)   Resp 16   SpO2 98%   Physical Exam Vitals signs and nursing note reviewed.  Constitutional:      General: She is not in acute distress.    Appearance: She is well-developed. She is not diaphoretic.  HENT:     Head: Normocephalic.  Eyes:     General: No scleral icterus.    Conjunctiva/sclera: Conjunctivae normal.  Neck:     Musculoskeletal: Normal range of motion.  Cardiovascular:     Rate and Rhythm: Normal rate and regular rhythm.     Heart sounds: Normal heart sounds. No murmur. No friction rub. No gallop.    Pulmonary:     Effort: Pulmonary effort is normal. No respiratory distress.     Breath sounds: Normal breath sounds.  Abdominal:     General: Bowel sounds are normal. There is no distension.     Palpations: Abdomen is soft. There is no mass.     Tenderness: There is no abdominal tenderness. There is no guarding.  Musculoskeletal:     Comments: Patient in c-collar.  Obvious hematoma to the left scalp.  No obvious deformities or bruising to the left shoulder, no bony tenderness  Skin:    General: Skin is warm and dry.  Neurological:     Mental Status: She is alert and oriented to person, place, and time.  Psychiatric:        Behavior: Behavior normal.      ED Treatments / Results  Labs (all labs ordered are listed, but only abnormal results are displayed) Labs Reviewed - No data to display  EKG None  Radiology No results found.  Procedures Procedures (including critical care time)  Medications Ordered in ED Medications - No data to display   Initial Impression / Assessment and Plan / ED Course  I have reviewed the triage vital signs and the nursing notes.  Pertinent labs & imaging results that were available during my care of the patient were reviewed by me and considered in my medical decision making (see chart for details).        Elderly patient with mechanical Fall at nursing home. At baseline mental status.. No blood thinners. Negative  imaging. Appears safe to return to SNF   Final Clinical Impressions(s) / ED Diagnoses   Final diagnoses:  None    ED Discharge Orders    None       Margarita Mail, PA-C 12/14/18 2221    Lajean Saver, MD 12/15/18 [phone]

## 2018-12-20 LAB — BLOOD GAS, VENOUS
Acid-Base Excess: 0.2 mmol/L (ref 0.0–2.0)
Bicarbonate: 26.9 mmol/L (ref 20.0–28.0)
O2 Saturation: 18.5 %
Patient temperature: 37
pCO2, Ven: 51 mmHg (ref 44.0–60.0)
pH, Ven: 7.33 (ref 7.250–7.430)

## 2018-12-21 ENCOUNTER — Ambulatory Visit: Payer: Self-pay | Admitting: Family

## 2019-01-19 ENCOUNTER — Other Ambulatory Visit: Payer: Self-pay

## 2019-01-19 ENCOUNTER — Emergency Department: Payer: Medicaid Other

## 2019-01-19 ENCOUNTER — Encounter: Payer: Self-pay | Admitting: Emergency Medicine

## 2019-01-19 ENCOUNTER — Inpatient Hospital Stay
Admission: EM | Admit: 2019-01-19 | Discharge: 2019-01-21 | DRG: 871 | Disposition: A | Discharge status: Home Health | Payer: Medicaid Other | Attending: Internal Medicine | Admitting: Internal Medicine

## 2019-01-19 DIAGNOSIS — E785 Hyperlipidemia, unspecified: Secondary | ICD-10-CM | POA: Diagnosis present

## 2019-01-19 DIAGNOSIS — Z881 Allergy status to other antibiotic agents status: Secondary | ICD-10-CM

## 2019-01-19 DIAGNOSIS — Z7951 Long term (current) use of inhaled steroids: Secondary | ICD-10-CM

## 2019-01-19 DIAGNOSIS — I2510 Atherosclerotic heart disease of native coronary artery without angina pectoris: Secondary | ICD-10-CM | POA: Diagnosis present

## 2019-01-19 DIAGNOSIS — Z7902 Long term (current) use of antithrombotics/antiplatelets: Secondary | ICD-10-CM

## 2019-01-19 DIAGNOSIS — E669 Obesity, unspecified: Secondary | ICD-10-CM | POA: Diagnosis present

## 2019-01-19 DIAGNOSIS — J189 Pneumonia, unspecified organism: Secondary | ICD-10-CM | POA: Diagnosis present

## 2019-01-19 DIAGNOSIS — K219 Gastro-esophageal reflux disease without esophagitis: Secondary | ICD-10-CM | POA: Diagnosis present

## 2019-01-19 DIAGNOSIS — A419 Sepsis, unspecified organism: Secondary | ICD-10-CM | POA: Diagnosis not present

## 2019-01-19 DIAGNOSIS — R32 Unspecified urinary incontinence: Secondary | ICD-10-CM | POA: Diagnosis present

## 2019-01-19 DIAGNOSIS — Z9049 Acquired absence of other specified parts of digestive tract: Secondary | ICD-10-CM

## 2019-01-19 DIAGNOSIS — I341 Nonrheumatic mitral (valve) prolapse: Secondary | ICD-10-CM | POA: Diagnosis present

## 2019-01-19 DIAGNOSIS — Z8543 Personal history of malignant neoplasm of ovary: Secondary | ICD-10-CM

## 2019-01-19 DIAGNOSIS — G629 Polyneuropathy, unspecified: Secondary | ICD-10-CM | POA: Diagnosis present

## 2019-01-19 DIAGNOSIS — I5032 Chronic diastolic (congestive) heart failure: Secondary | ICD-10-CM | POA: Diagnosis present

## 2019-01-19 DIAGNOSIS — Z20828 Contact with and (suspected) exposure to other viral communicable diseases: Secondary | ICD-10-CM | POA: Diagnosis present

## 2019-01-19 DIAGNOSIS — Z79899 Other long term (current) drug therapy: Secondary | ICD-10-CM

## 2019-01-19 DIAGNOSIS — E876 Hypokalemia: Secondary | ICD-10-CM | POA: Diagnosis present

## 2019-01-19 DIAGNOSIS — Z8673 Personal history of transient ischemic attack (TIA), and cerebral infarction without residual deficits: Secondary | ICD-10-CM

## 2019-01-19 DIAGNOSIS — Z6834 Body mass index (BMI) 34.0-34.9, adult: Secondary | ICD-10-CM

## 2019-01-19 DIAGNOSIS — I110 Hypertensive heart disease with heart failure: Secondary | ICD-10-CM | POA: Diagnosis present

## 2019-01-19 DIAGNOSIS — Z9221 Personal history of antineoplastic chemotherapy: Secondary | ICD-10-CM

## 2019-01-19 DIAGNOSIS — Z882 Allergy status to sulfonamides status: Secondary | ICD-10-CM

## 2019-01-19 DIAGNOSIS — Z22322 Carrier or suspected carrier of Methicillin resistant Staphylococcus aureus: Secondary | ICD-10-CM

## 2019-01-19 DIAGNOSIS — G4733 Obstructive sleep apnea (adult) (pediatric): Secondary | ICD-10-CM | POA: Diagnosis present

## 2019-01-19 DIAGNOSIS — F419 Anxiety disorder, unspecified: Secondary | ICD-10-CM | POA: Diagnosis present

## 2019-01-19 DIAGNOSIS — R0902 Hypoxemia: Secondary | ICD-10-CM | POA: Diagnosis present

## 2019-01-19 DIAGNOSIS — Z888 Allergy status to other drugs, medicaments and biological substances status: Secondary | ICD-10-CM

## 2019-01-19 DIAGNOSIS — Z7982 Long term (current) use of aspirin: Secondary | ICD-10-CM | POA: Diagnosis not present

## 2019-01-19 DIAGNOSIS — Z8249 Family history of ischemic heart disease and other diseases of the circulatory system: Secondary | ICD-10-CM

## 2019-01-19 LAB — CBC WITH DIFFERENTIAL/PLATELET
Abs Immature Granulocytes: 0.07 10*3/uL (ref 0.00–0.07)
Basophils Absolute: 0 10*3/uL (ref 0.0–0.1)
Basophils Relative: 0 %
Eosinophils Absolute: 0.3 10*3/uL (ref 0.0–0.5)
Eosinophils Relative: 3 %
HCT: 29.5 % — ABNORMAL LOW (ref 36.0–46.0)
Hemoglobin: 9.5 g/dL — ABNORMAL LOW (ref 12.0–15.0)
Immature Granulocytes: 1 %
Lymphocytes Relative: 14 %
Lymphs Abs: 1.4 10*3/uL (ref 0.7–4.0)
MCH: 31.5 pg (ref 26.0–34.0)
MCHC: 32.2 g/dL (ref 30.0–36.0)
MCV: 97.7 fL (ref 80.0–100.0)
Monocytes Absolute: 1.1 10*3/uL — ABNORMAL HIGH (ref 0.1–1.0)
Monocytes Relative: 10 %
Neutro Abs: 7.7 10*3/uL (ref 1.7–7.7)
Neutrophils Relative %: 72 %
Platelets: 219 10*3/uL (ref 150–400)
RBC: 3.02 MIL/uL — ABNORMAL LOW (ref 3.87–5.11)
RDW: 13.9 % (ref 11.5–15.5)
WBC: 10.6 10*3/uL — ABNORMAL HIGH (ref 4.0–10.5)
nRBC: 0 % (ref 0.0–0.2)

## 2019-01-19 LAB — CREATININE, SERUM
Creatinine, Ser: 0.78 mg/dL (ref 0.44–1.00)
GFR calc Af Amer: >60 mL/min (ref >60)
GFR calc non Af Amer: >60 mL/min (ref >60)

## 2019-01-19 LAB — URINALYSIS, COMPLETE (UACMP) WITH MICROSCOPIC
Bilirubin Urine: NEGATIVE
Glucose, UA: NEGATIVE mg/dL
Ketones, ur: NEGATIVE mg/dL
Nitrite: NEGATIVE
Protein, ur: NEGATIVE mg/dL
Specific Gravity, Urine: 1.006 (ref 1.005–1.030)
WBC, UA: >50 WBC/hpf — ABNORMAL HIGH (ref 0–5)
pH: 5 (ref 5.0–8.0)

## 2019-01-19 LAB — COMPREHENSIVE METABOLIC PANEL
ALT: 19 U/L (ref 0–44)
AST: 31 U/L (ref 15–41)
Albumin: 3.6 g/dL (ref 3.5–5.0)
Alkaline Phosphatase: 76 U/L (ref 38–126)
Anion gap: 12 (ref 5–15)
BUN: 14 mg/dL (ref 8–23)
CO2: 22 mmol/L (ref 22–32)
Calcium: 9.7 mg/dL (ref 8.9–10.3)
Chloride: 104 mmol/L (ref 98–111)
Creatinine, Ser: 0.86 mg/dL (ref 0.44–1.00)
GFR calc Af Amer: >60 mL/min (ref >60)
GFR calc non Af Amer: >60 mL/min (ref >60)
Glucose, Bld: 139 mg/dL — ABNORMAL HIGH (ref 70–99)
Potassium: 3.3 mmol/L — ABNORMAL LOW (ref 3.5–5.1)
Sodium: 138 mmol/L (ref 135–145)
Total Bilirubin: 0.5 mg/dL (ref 0.3–1.2)
Total Protein: 7.9 g/dL (ref 6.5–8.1)

## 2019-01-19 LAB — CBC
HCT: 29.5 % — ABNORMAL LOW (ref 36.0–46.0)
Hemoglobin: 9.4 g/dL — ABNORMAL LOW (ref 12.0–15.0)
MCH: 31.3 pg (ref 26.0–34.0)
MCHC: 31.9 g/dL (ref 30.0–36.0)
MCV: 98.3 fL (ref 80.0–100.0)
Platelets: 188 10*3/uL (ref 150–400)
RBC: 3 MIL/uL — ABNORMAL LOW (ref 3.87–5.11)
RDW: 13.9 % (ref 11.5–15.5)
WBC: 6.2 10*3/uL (ref 4.0–10.5)
nRBC: 0 % (ref 0.0–0.2)

## 2019-01-19 LAB — LACTIC ACID, PLASMA
Lactic Acid, Venous: 2.3 mmol/L (ref 0.5–1.9)
Lactic Acid, Venous: 1.6 mmol/L (ref 0.5–1.9)

## 2019-01-19 LAB — PROTIME-INR
INR: 1 (ref 0.8–1.2)
Prothrombin Time: 13.2 seconds (ref 11.4–15.2)

## 2019-01-19 LAB — SARS CORONAVIRUS 2 BY RT PCR (HOSPITAL ORDER, PERFORMED IN ~~LOC~~ HOSPITAL LAB): SARS Coronavirus 2: NEGATIVE

## 2019-01-19 MED ORDER — PAROXETINE HCL 30 MG PO TABS
30.0000 mg | ORAL_TABLET | Freq: Every day | ORAL | Status: DC
  Administered 2019-01-20: 16:00:00 30 mg via ORAL
  Filled 2019-01-19 (×2): qty 1

## 2019-01-19 MED ORDER — OXYBUTYNIN CHLORIDE 5 MG PO TABS
5.0000 mg | ORAL_TABLET | Freq: Two times a day (BID) | ORAL | Status: DC
  Administered 2019-01-20 – 2019-01-21 (×4): 5 mg via ORAL
  Filled 2019-01-19 (×4): qty 1

## 2019-01-19 MED ORDER — PREGABALIN 75 MG PO CAPS
75.0000 mg | ORAL_CAPSULE | Freq: Two times a day (BID) | ORAL | Status: DC
  Administered 2019-01-20 – 2019-01-21 (×4): 75 mg via ORAL
  Filled 2019-01-19 (×4): qty 1

## 2019-01-19 MED ORDER — PIPERACILLIN-TAZOBACTAM 3.375 G IVPB
3.3750 g | Freq: Three times a day (TID) | INTRAVENOUS | Status: DC
  Administered 2019-01-20 – 2019-01-21 (×5): 3.375 g via INTRAVENOUS
  Filled 2019-01-19 (×5): qty 50

## 2019-01-19 MED ORDER — VANCOMYCIN HCL IN DEXTROSE 1-5 GM/200ML-% IV SOLN
1000.0000 mg | Freq: Once | INTRAVENOUS | Status: AC
  Administered 2019-01-19: 20:00:00 1000 mg via INTRAVENOUS
  Filled 2019-01-19: qty 200

## 2019-01-19 MED ORDER — ONDANSETRON HCL 4 MG/2ML IJ SOLN: 4.0000 mg | Freq: Four times a day (QID) | INTRAMUSCULAR | Status: DC | PRN

## 2019-01-19 MED ORDER — PREGABALIN 75 MG PO CAPS
150.0000 mg | ORAL_CAPSULE | Freq: Every day | ORAL | Status: DC
  Administered 2019-01-19 – 2019-01-20 (×2): 150 mg via ORAL
  Filled 2019-01-19 (×2): qty 2

## 2019-01-19 MED ORDER — CLONAZEPAM 0.5 MG PO TABS: 0.5000 mg | ORAL_TABLET | Freq: Every evening | ORAL | Status: DC | PRN

## 2019-01-19 MED ORDER — MELATONIN 5 MG PO TABS
5.0000 mg | ORAL_TABLET | Freq: Every day | ORAL | Status: DC
  Administered 2019-01-20: 22:00:00 5 mg via ORAL
  Filled 2019-01-19 (×3): qty 1

## 2019-01-19 MED ORDER — VANCOMYCIN HCL IN DEXTROSE 1-5 GM/200ML-% IV SOLN
1000.0000 mg | INTRAVENOUS | Status: DC
  Administered 2019-01-20: 22:00:00 1000 mg via INTRAVENOUS
  Filled 2019-01-19 (×2): qty 200

## 2019-01-19 MED ORDER — ACETAMINOPHEN 650 MG RE SUPP: 650.0000 mg | Freq: Four times a day (QID) | RECTAL | Status: DC | PRN

## 2019-01-19 MED ORDER — ONDANSETRON HCL 4 MG PO TABS: 4.0000 mg | ORAL_TABLET | Freq: Three times a day (TID) | ORAL | Status: DC | PRN

## 2019-01-19 MED ORDER — VENLAFAXINE HCL ER 75 MG PO CP24
75.0000 mg | ORAL_CAPSULE | Freq: Every day | ORAL | Status: DC
  Administered 2019-01-20 – 2019-01-21 (×2): 75 mg via ORAL
  Filled 2019-01-19 (×2): qty 1

## 2019-01-19 MED ORDER — NITROGLYCERIN 0.4 MG SL SUBL: 0.4000 mg | SUBLINGUAL_TABLET | SUBLINGUAL | Status: DC | PRN

## 2019-01-19 MED ORDER — PIPERACILLIN-TAZOBACTAM 3.375 G IVPB 30 MIN
3.3750 g | Freq: Once | INTRAVENOUS | Status: AC
  Administered 2019-01-19: 19:00:00 3.375 g via INTRAVENOUS
  Filled 2019-01-19: qty 50

## 2019-01-19 MED ORDER — ROSUVASTATIN CALCIUM 20 MG PO TABS
20.0000 mg | ORAL_TABLET | Freq: Every day | ORAL | Status: DC
  Administered 2019-01-20 (×2): 20 mg via ORAL
  Filled 2019-01-19 (×2): qty 1

## 2019-01-19 MED ORDER — VITAMIN D 25 MCG (1000 UNIT) PO TABS
5000.0000 [IU] | ORAL_TABLET | Freq: Every day | ORAL | Status: DC
  Administered 2019-01-20 – 2019-01-21 (×2): 5000 [IU] via ORAL
  Filled 2019-01-19 (×2): qty 5

## 2019-01-19 MED ORDER — SPIRONOLACTONE 25 MG PO TABS
12.5000 mg | ORAL_TABLET | Freq: Every day | ORAL | Status: DC
  Administered 2019-01-20 – 2019-01-21 (×2): 12.5 mg via ORAL
  Filled 2019-01-19: qty 0.5
  Filled 2019-01-19: qty 1
  Filled 2019-01-19: qty 0.5
  Filled 2019-01-19: qty 1

## 2019-01-19 MED ORDER — SODIUM CHLORIDE 0.9% FLUSH
3.0000 mL | Freq: Once | INTRAVENOUS | Status: AC
  Administered 2019-01-19: 19:00:00 3 mL via INTRAVENOUS

## 2019-01-19 MED ORDER — FAMOTIDINE 20 MG PO TABS
20.0000 mg | ORAL_TABLET | Freq: Two times a day (BID) | ORAL | Status: DC
  Administered 2019-01-20 – 2019-01-21 (×3): 20 mg via ORAL
  Filled 2019-01-19 (×3): qty 1

## 2019-01-19 MED ORDER — ACETAMINOPHEN 325 MG PO TABS
650.0000 mg | ORAL_TABLET | Freq: Four times a day (QID) | ORAL | Status: DC | PRN
  Administered 2019-01-20: 650 mg via ORAL
  Filled 2019-01-19: qty 2

## 2019-01-19 MED ORDER — ACETAMINOPHEN 325 MG PO TABS
650.0000 mg | ORAL_TABLET | Freq: Once | ORAL | Status: AC
  Administered 2019-01-19: 18:00:00 650 mg via ORAL
  Filled 2019-01-19: qty 2

## 2019-01-19 MED ORDER — POTASSIUM CHLORIDE CRYS ER 20 MEQ PO TBCR
40.0000 meq | EXTENDED_RELEASE_TABLET | Freq: Once | ORAL | Status: AC
  Administered 2019-01-19: 40 meq via ORAL
  Filled 2019-01-19: qty 2

## 2019-01-19 MED ORDER — ONDANSETRON HCL 4 MG PO TABS: 4.0000 mg | ORAL_TABLET | Freq: Four times a day (QID) | ORAL | Status: DC | PRN

## 2019-01-19 MED ORDER — CLOPIDOGREL BISULFATE 75 MG PO TABS
75.0000 mg | ORAL_TABLET | Freq: Every day | ORAL | Status: DC
  Administered 2019-01-20 – 2019-01-21 (×2): 75 mg via ORAL
  Filled 2019-01-19 (×2): qty 1

## 2019-01-19 MED ORDER — ASPIRIN 81 MG PO CHEW
81.0000 mg | CHEWABLE_TABLET | Freq: Every day | ORAL | Status: DC
  Administered 2019-01-20 – 2019-01-21 (×2): 81 mg via ORAL
  Filled 2019-01-19 (×2): qty 1

## 2019-01-19 MED ORDER — ENOXAPARIN SODIUM 40 MG/0.4ML ~~LOC~~ SOLN
40.0000 mg | SUBCUTANEOUS | Status: DC
  Administered 2019-01-20 (×2): 40 mg via SUBCUTANEOUS
  Filled 2019-01-19 (×2): qty 0.4

## 2019-01-19 NOTE — ED Notes (Signed)
Pt's husband is danny Poteet [phone]

## 2019-01-19 NOTE — Progress Notes (Signed)
   [place] at [place] Hospital Day: 0 days Destiny [name] is a 63 y.o. female known history of coronary artery disease, CHF, history of previous CVA, GERD, hypertension, hyperlipidemia, mitral valve prolapse, ovarian cancer, obstructive sleep apnea who presents to the hospital initially due to left upper extremity swelling.   Given patient's multiple comorbidities and recurrent hospitalizations discussed CODE STATUS with patient at bedside.  Explained to her difference being DNR and a full code.  Patient does not want heroic measures to save her life and does want to be resuscitated if needed.  Patient remains a full code for now.  Advance care planning discussed with patient  without additional Family at bedside. All questions in regards to overall condition and expected prognosis answered. The decision was made to continue current code status  CODE STATUS: full Time spent: 16 minutes

## 2019-01-19 NOTE — ED Notes (Signed)
Patient transported to Ultrasound 

## 2019-01-19 NOTE — ED Triage Notes (Signed)
Pt to ED via POV c/o left arm swelling. Pt states that it has been swollen x 1 month. Pt states that she has hx/o CVA and it affected her left arm. Pt is tachycardic in triage at 120, febrile at 101.8. Pt is in NAD.

## 2019-01-19 NOTE — ED Notes (Signed)
ED TO INPATIENT HANDOFF REPORT  ED Nurse Name and Phone #: Ainhoa Rallo [phone]  S Name/Age/Gender Destiny [name] 63 y.o. female Room/Bed: ED15A/ED15A  Code Status   Code Status: Prior  Home/SNF/Other Home Patient oriented to:a&ox2-3 Is this baseline? No   Triage Complete: Triage complete  Chief Complaint L Hand Swelling  Triage Note Pt to ED via POV c/o left arm swelling. Pt states that it has been swollen x 1 month. Pt states that she has hx/o CVA and it affected her left arm. Pt is tachycardic in triage at 120, febrile at 101.8. Pt is in NAD.   Allergies Allergies  Allergen Reactions  . Atorvastatin Hives    No s/sx of anaphylaxis, just intermittent hives.  Does not seem to be a class effect as she has tolerated lovastatin previously No s/sx of anaphylaxis, just intermittent hives.  Does not seem to be a class effect as she has tolerated lovastatin previously  . Fluconazole Rash    Reaction to generic but not name brand Reaction to generic but not name brand CAN USE DIFLUCAN.DOES NOT BREAK OUT WITH DIFLUCAN.(allergic only to generic brand)  . Sertraline Itching  . Sulfa Antibiotics Rash and Other (See Comments)    "hot " sensation  . Sulfamethoxazole-Trimethoprim Rash and Other (See Comments)    "felt hot" Other reaction(s): Other (See Comments) "felt hot"  . Sulfasalazine Other (See Comments) and Rash    "hot " sensation    Level of Care/Admitting Diagnosis ED Disposition    ED Disposition Condition [facility] Area: [place] [[id number]]  Level of Care: Med-Surg [16]  Covid Evaluation: Confirmed COVID Negative  Diagnosis: Pneumonia [244010]  Admitting Physician: Henreitta Leber [[id number]]  Attending Physician: Henreitta Leber [[id number]]  Estimated length of stay: past midnight tomorrow  Certification:: I certify this patient will need inpatient services for at least 2 midnights  PT Class (Do Not Modify): Inpatient [101]  PT Acc  Code (Do Not Modify): Private [1]       B Medical/Surgery History Past Medical History:  Diagnosis Date  . CAD (coronary artery disease)   . CHF (congestive heart failure) ([place])   . CVA (cerebral infarction)   . Female bladder prolapse   . GERD (gastroesophageal reflux disease)   . Hyperlipidemia   . Hypertension   . Hypokalemia   . Mitral valve disease   . Ovarian ca ([place])   . Sleep apnea   . Stroke [facility])    x3   Past Surgical History:  Procedure Laterality Date  . CARPAL TUNNEL RELEASE Bilateral   . CATARACT EXTRACTION Right 11/22/2017  . CHOLECYSTECTOMY    . COLON SURGERY    . TENNIS ELBOW RELEASE/NIRSCHEL PROCEDURE Left   . TONSILLECTOMY       A IV Location/Drains/Wounds Patient Lines/Drains/Airways Status   Active Line/Drains/Airways    Name:   Placement date:   Placement time:   Site:   Days:   Implanted Port Right Chest   -    -    Chest      Peripheral IV 01/19/19 Right Forearm   01/19/19    1825    Forearm   less than 1   Peripheral IV 01/19/19 Left Wrist   01/19/19    1825    Wrist   less than 1   Wound / Incision (Open or Dehisced) 12/08/18 Other (Comment);Incision - Open Abdomen Medial;Upper old incision, open at top   12/08/18  0630    Abdomen   42          Intake/Output Last 24 hours No intake or output data in the 24 hours ending 01/19/19 2202  Labs/Imaging Results for orders placed or performed during the hospital encounter of 01/19/19 (from the past 48 hour(s))  Comprehensive metabolic panel     Status: Abnormal   Collection Time: 01/19/19  5:44 PM  Result Value Ref Range   Sodium 138 135 - 145 mmol/L   Potassium 3.3 (L) 3.5 - 5.1 mmol/L   Chloride 104 98 - 111 mmol/L   CO2 22 22 - 32 mmol/L   Glucose, Bld 139 (H) 70 - 99 mg/dL   BUN 14 8 - 23 mg/dL   Creatinine, Ser 0.86 0.44 - 1.00 mg/dL   Calcium 9.7 8.9 - 10.3 mg/dL   Total Protein 7.9 6.5 - 8.1 g/dL   Albumin 3.6 3.5 - 5.0 g/dL   AST 31 15 - 41 U/L   ALT 19 0 - 44 U/L    Alkaline Phosphatase 76 38 - 126 U/L   Total Bilirubin 0.5 0.3 - 1.2 mg/dL   GFR calc non Af Amer >60 >60 mL/min   GFR calc Af Amer >60 >60 mL/min   Anion gap 12 5 - 15    Comment: Performed at [facility], [address] [place], [place] [address]  Lactic acid, plasma     Status: Abnormal   Collection Time: 01/19/19  5:44 PM  Result Value Ref Range   Lactic Acid, Venous 2.3 (HH) 0.5 - 1.9 mmol/L    Comment: CRITICAL RESULT CALLED TO, READ BACK BY AND VERIFIED WITH Jazzalyn Loewenstein @1817  01/19/2019 BY FMW Performed at [facility], [address] [place], [place] [address]   CBC with Differential     Status: Abnormal   Collection Time: 01/19/19  5:44 PM  Result Value Ref Range   WBC 10.6 (H) 4.0 - 10.5 K/uL   RBC 3.02 (L) 3.87 - 5.11 MIL/uL   Hemoglobin 9.5 (L) 12.0 - 15.0 g/dL   HCT 29.5 (L) 36.0 - 46.0 %   MCV 97.7 80.0 - 100.0 fL   MCH 31.5 26.0 - 34.0 pg   MCHC 32.2 30.0 - 36.0 g/dL   RDW 13.9 11.5 - 15.5 %   Platelets 219 150 - 400 K/uL   nRBC 0.0 0.0 - 0.2 %   Neutrophils Relative % 72 %   Neutro Abs 7.7 1.7 - 7.7 K/uL   Lymphocytes Relative 14 %   Lymphs Abs 1.4 0.7 - 4.0 K/uL   Monocytes Relative 10 %   Monocytes Absolute 1.1 (H) 0.1 - 1.0 K/uL   Eosinophils Relative 3 %   Eosinophils Absolute 0.3 0.0 - 0.5 K/uL   Basophils Relative 0 %   Basophils Absolute 0.0 0.0 - 0.1 K/uL   Immature Granulocytes 1 %   Abs Immature Granulocytes 0.07 0.00 - 0.07 K/uL    Comment: Performed at [facility], [address] [place], [place] [address]  Protime-INR     Status: None   Collection Time: 01/19/19  5:44 PM  Result Value Ref Range   Prothrombin Time 13.2 11.4 - 15.2 seconds   INR 1.0 0.8 - 1.2    Comment: (NOTE) INR goal varies based on device and disease states. Performed at [facility], [address] [place], [place] [address]   Urinalysis, Complete w Microscopic     Status: Abnormal   Collection Time:  01/19/19  5:44 PM   Result Value Ref Range   Color, Urine YELLOW (A) YELLOW   APPearance HAZY (A) CLEAR   Specific Gravity, Urine 1.006 1.005 - 1.030   pH 5.0 5.0 - 8.0   Glucose, UA NEGATIVE NEGATIVE mg/dL   Hgb urine dipstick SMALL (A) NEGATIVE   Bilirubin Urine NEGATIVE NEGATIVE   Ketones, ur NEGATIVE NEGATIVE mg/dL   Protein, ur NEGATIVE NEGATIVE mg/dL   Nitrite NEGATIVE NEGATIVE   Leukocytes,Ua LARGE (A) NEGATIVE   RBC / HPF 6-10 0 - 5 RBC/hpf   WBC, UA >50 (H) 0 - 5 WBC/hpf   Bacteria, UA RARE (A) NONE SEEN   Squamous Epithelial / LPF 0-5 0 - 5   Mucus PRESENT     Comment: Performed at [facility], [address] [place], [place] [address]  SARS Coronavirus 2 (CEPHEID- Performed in [place] hospital lab), Hosp Order     Status: None   Collection Time: 01/19/19  5:45 PM   Specimen: Nasopharyngeal Swab  Result Value Ref Range   SARS Coronavirus 2 NEGATIVE NEGATIVE    Comment: (NOTE) If result is NEGATIVE SARS-CoV-2 target nucleic acids are NOT DETECTED. The SARS-CoV-2 RNA is generally detectable in upper and lower  respiratory specimens during the acute phase of infection. The lowest  concentration of SARS-CoV-2 viral copies this assay can detect is 250  copies / mL. A negative result does not preclude SARS-CoV-2 infection  and should not be used as the sole basis for treatment or other  patient management decisions.  A negative result may occur with  improper specimen collection / handling, submission of specimen other  than nasopharyngeal swab, presence of viral mutation(s) within the  areas targeted by this assay, and inadequate number of viral copies  (<250 copies / mL). A negative result must be combined with clinical  observations, patient history, and epidemiological information. If result is POSITIVE SARS-CoV-2 target nucleic acids are DETECTED. The SARS-CoV-2 RNA is generally detectable in upper and lower  respiratory specimens dur ing the acute phase of infection.   Positive  results are indicative of active infection with SARS-CoV-2.  Clinical  correlation with patient history and other diagnostic information is  necessary to determine patient infection status.  Positive results do  not rule out bacterial infection or co-infection with other viruses. If result is PRESUMPTIVE POSTIVE SARS-CoV-2 nucleic acids MAY BE PRESENT.   A presumptive positive result was obtained on the submitted specimen  and confirmed on repeat testing.  While 2019 novel coronavirus  (SARS-CoV-2) nucleic acids may be present in the submitted sample  additional confirmatory testing may be necessary for epidemiological  and / or clinical management purposes  to differentiate between  SARS-CoV-2 and other Sarbecovirus currently known to infect humans.  If clinically indicated additional testing with an alternate test  methodology [id number]) is advised. The SARS-CoV-2 RNA is generally  detectable in upper and lower respiratory sp ecimens during the acute  phase of infection. The expected result is Negative. Fact Sheet for Patients:  StrictlyIdeas.no Fact Sheet for Healthcare Providers: BankingDealers.co.za This test is not yet approved or cleared by the [place] FDA and has been authorized for detection and/or diagnosis of SARS-CoV-2 by FDA under an Emergency Use Authorization (EUA).  This EUA will remain in effect (meaning this test can be used) for the duration of the COVID-19 declaration under Section 564(b)(1) of the Act, 21 U.S.C. section 360bbb-3(b)(1), unless the authorization is terminated or revoked sooner. Performed  at [facility], [address] [place], [place] [address]    US Abdomen Limited  Result Date: 01/19/2019 CLINICAL DATA:  Bloody purulence drainage from surgical site for 2-3 months. EXAM: ULTRASOUND ABDOMEN LIMITED COMPARISON:  None. FINDINGS: There is linear hypoechogenicity in the region of  the patient's scar. The finding is at least partially due to scarring. However, a small amount of tracking fluid is not excluded given history. There is hypervascularity in this region. IMPRESSION: 1. Linear hypo vascularity in the region of the patient's scar and drainage is identified. The findings are at least partially due to scarring. However, I do suspect there may be some fluid tracking through this region. No discrete abscess. Hypervascularity is noted. CT imaging could further evaluate for abscess if there is concern. Electronically Signed   By: Dorise Bullion III M.D   On: 01/19/2019 20:54   US Venous Img Upper Uni Left  Result Date: 01/19/2019 CLINICAL DATA:  Left upper extremity swelling. EXAM: LEFT UPPER EXTREMITY VENOUS DOPPLER ULTRASOUND TECHNIQUE: Gray-scale sonography with graded compression, as well as color Doppler and duplex ultrasound were performed to evaluate the upper extremity deep venous system from the level of the subclavian vein and including the jugular, axillary, basilic, radial, ulnar and upper cephalic vein. Spectral Doppler was utilized to evaluate flow at rest and with distal augmentation maneuvers. COMPARISON:  None. FINDINGS: Contralateral Subclavian Vein: Respiratory phasicity is normal and symmetric with the symptomatic side. No evidence of thrombus. Normal compressibility. Internal Jugular Vein: No evidence of thrombus. Normal compressibility, respiratory phasicity and response to augmentation. Subclavian Vein: No evidence of thrombus. Normal compressibility, respiratory phasicity and response to augmentation. Axillary Vein: No evidence of thrombus. Normal compressibility, respiratory phasicity and response to augmentation. Cephalic Vein: No evidence of thrombus. Normal compressibility, respiratory phasicity and response to augmentation. Basilic Vein: No evidence of thrombus. Normal compressibility, respiratory phasicity and response to augmentation. Brachial Veins: No  evidence of thrombus. Normal compressibility, respiratory phasicity and response to augmentation. Radial Veins: No evidence of thrombus. Normal compressibility, respiratory phasicity and response to augmentation. Ulnar Veins: No evidence of thrombus. Normal compressibility, respiratory phasicity and response to augmentation. Venous Reflux:  None visualized. Other Findings:  None visualized. IMPRESSION: No evidence of DVT within the left upper extremity. Electronically Signed   By: Dorise Bullion III M.D   On: 01/19/2019 20:52   Dg Chest Port 1 View  Result Date: 01/19/2019 CLINICAL DATA:  Possible sepsis. EXAM: PORTABLE CHEST 1 VIEW COMPARISON:  Chest radiograph 12/07/2018 FINDINGS: Monitoring leads overlie the patient. Right anterior chest wall Port-A-Cath is present with tip projecting over the superior vena cava. Stable cardiac and mediastinal contours. Interval increase in consolidation within the right mid lower lung. No pleural effusion or pneumothorax. IMPRESSION: Increasing right mid and lower lung consolidation which may represent pneumonia in the appropriate clinical setting. Followup PA and lateral chest X-ray is recommended in 3-4 weeks following trial of antibiotic therapy to ensure resolution and exclude underlying malignancy. Electronically Signed   By: Lovey Newcomer M.D.   On: 01/19/2019 18:14   Dg Hand Complete Left  Result Date: 01/19/2019 CLINICAL DATA:  Left hand edema. Suspected sepsis. No reported injury. EXAM: LEFT HAND - COMPLETE 3+ VIEW COMPARISON:  None. FINDINGS: Peripheral intravenous catheter is noted in the dorsal left wrist soft tissues. No fracture or dislocation. No suspicious focal osseous lesions. No significant arthropathy. No osseous erosions or periosteal reaction. IMPRESSION: No acute osseous abnormality. Electronically Signed   By: Rinaldo Ratel  Poff M.D.   On: 01/19/2019 18:15    Pending Labs Unresulted Labs (From admission, onward)    Start     Ordered   01/19/19 1737   Urine culture  Once,   STAT     01/19/19 1736   01/19/19 1728  Lactic acid, plasma  Now then every 2 hours,   STAT     01/19/19 1727   01/19/19 1728  Culture, blood (Routine x 2)  BLOOD CULTURE X 2,   STAT     01/19/19 1727   Signed and Held  Basic metabolic panel  Tomorrow morning,   R     Signed and Held   Signed and Held  CBC  Tomorrow morning,   R     Signed and Held   Signed and Held  CBC  (enoxaparin (LOVENOX)    CrCl >/= 30 ml/min)  Once,   R    Comments: Baseline for enoxaparin therapy IF NOT ALREADY DRAWN.  Notify MD if PLT < 100 K.    Signed and Held   Signed and Held  Creatinine, serum  (enoxaparin (LOVENOX)    CrCl >/= 30 ml/min)  Once,   R    Comments: Baseline for enoxaparin therapy IF NOT ALREADY DRAWN.    Signed and Held   Signed and Held  Creatinine, serum  (enoxaparin (LOVENOX)    CrCl >/= 30 ml/min)  Weekly,   R    Comments: while on enoxaparin therapy    Signed and Held          Vitals/Pain Today's Vitals   01/19/19 2022 01/19/19 2030 01/19/19 2100 01/19/19 2130  BP: 110/75 90/66 100/82 112/81  Pulse: 93 (!) 102 (!) 110 (!) 113  Resp: (!) 24 (!) 23 20 19   Temp:      TempSrc:      SpO2: 94% 96% 94% 96%  Weight:    92.3 kg  PainSc:        Isolation Precautions No active isolations  Medications Medications  potassium chloride SA (K-DUR) CR tablet 40 mEq (has no administration in time range)  vancomycin (VANCOCIN) IVPB 1000 mg/200 mL premix (has no administration in time range)  piperacillin-tazobactam (ZOSYN) IVPB 3.375 g (has no administration in time range)  sodium chloride flush (NS) 0.9 % injection 3 mL (3 mLs Intravenous Given 01/19/19 1835)  acetaminophen (TYLENOL) tablet 650 mg (650 mg Oral Given 01/19/19 1811)  vancomycin (VANCOCIN) IVPB 1000 mg/200 mL premix (0 mg Intravenous Stopped 01/19/19 2131)  piperacillin-tazobactam (ZOSYN) IVPB 3.375 g (0 g Intravenous Stopped 01/19/19 1927)    Mobility walks with device Low fall risk   Focused  Assessments Pulmonary Assessment Handoff:  Lung sounds:   O2 Device: Room Air        R Recommendations: See Admitting Provider Note  Report given to:   Additional Notes:

## 2019-01-19 NOTE — ED Notes (Signed)
Pt sleeping soundly.

## 2019-01-19 NOTE — H&P (Signed)
[facility] NAME: Destiny [name]    MR#:  [id number]  DATE OF BIRTH:  1956-04-23  DATE OF ADMISSION:  01/19/2019  PRIMARY CARE PHYSICIAN: Destiny Lank, MD   REQUESTING/REFERRING PHYSICIAN: Dr. Charlotte [name]  CHIEF COMPLAINT:   Chief Complaint  Patient presents with   Arm Swelling  Fever, Hypoxia.   HISTORY OF PRESENT ILLNESS:  Destiny [name]  is a 63 y.o. female with a known history of coronary artery disease, CHF, history of previous CVA, GERD, hypertension, hyperlipidemia, mitral valve prolapse, ovarian cancer, obstructive sleep apnea who presents to the hospital initially due to left upper extremity swelling.  Patient presents to the hospital due to left upper extremity swelling and her husband thought that she was in CHF and therefore sent her to the ER for further evaluation.  In the ER triage patient was noted to have a fever of 101 she was tachypneic and tachycardic and underwent sepsis work-up which showed suspected pneumonia.  Patient's COVID-19 test is negative.  Patient underwent Dopplers of her left upper extremity which is also negative for DVT.  Patient rules in for sepsis secondary to pneumonia and therefore hospitalist services were contacted for further treatment evaluation.  She denies any abdominal pain, diarrhea, melena, hematochezia, dysuria hematuria, cough, worsening shortness of breath.  PAST MEDICAL HISTORY:   Past Medical History:  Diagnosis Date   CAD (coronary artery disease)    CHF (congestive heart failure) (HCC)    CVA (cerebral infarction)    Female bladder prolapse    GERD (gastroesophageal reflux disease)    Hyperlipidemia    Hypertension    Hypokalemia    Mitral valve disease    Ovarian ca ([place])    Sleep apnea    Stroke ([place])    x3    PAST SURGICAL HISTORY:   Past Surgical History:  Procedure Laterality Date   CARPAL TUNNEL RELEASE Bilateral    CATARACT EXTRACTION Right  11/22/2017   CHOLECYSTECTOMY     COLON SURGERY     TENNIS ELBOW RELEASE/NIRSCHEL PROCEDURE Left    TONSILLECTOMY      SOCIAL HISTORY:   Social History   Tobacco Use   Smoking status: Never Smoker   Smokeless tobacco: Never Used  Substance Use Topics   Alcohol use: No    Alcohol/week: 0.0 standard drinks    FAMILY HISTORY:   Family History  Problem Relation Age of Onset   Heart disease Other    Hypertension Other    Rheum arthritis Mother    CAD Father     DRUG ALLERGIES:   Allergies  Allergen Reactions   Atorvastatin Hives    No s/sx of anaphylaxis, just intermittent hives.  Does not seem to be a class effect as she has tolerated lovastatin previously No s/sx of anaphylaxis, just intermittent hives.  Does not seem to be a class effect as she has tolerated lovastatin previously   Fluconazole Rash    Reaction to generic but not name brand Reaction to generic but not name brand CAN USE DIFLUCAN.DOES NOT BREAK OUT WITH DIFLUCAN.(allergic only to generic brand)   Sertraline Itching   Sulfa Antibiotics Rash and Other (See Comments)    "hot " sensation   Sulfamethoxazole-Trimethoprim Rash and Other (See Comments)    "felt hot" Other reaction(s): Other (See Comments) "felt hot"   Sulfasalazine Other (See Comments) and Rash    "hot " sensation    REVIEW OF SYSTEMS:  Review of Systems  Constitutional: Positive for fever. Negative for weight loss.  HENT: Negative for congestion, nosebleeds and tinnitus.   Eyes: Negative for blurred vision, double vision and redness.  Respiratory: Negative for cough, hemoptysis and shortness of breath.   Cardiovascular: Negative for chest pain, orthopnea, leg swelling and PND.  Gastrointestinal: Negative for abdominal pain, diarrhea, melena, nausea and vomiting.  Genitourinary: Negative for dysuria, hematuria and urgency.  Musculoskeletal: Negative for falls and joint pain.  Neurological: Positive for weakness.  Negative for dizziness, tingling, sensory change, focal weakness, seizures and headaches.  Endo/Heme/Allergies: Negative for polydipsia. Does not bruise/bleed easily.  Psychiatric/Behavioral: Negative for depression and memory loss. The patient is not nervous/anxious.     MEDICATIONS AT HOME:   Prior to Admission medications   Medication Sig Start Date End Date Taking? Authorizing Provider  acetaminophen (TYLENOL) 325 MG tablet Take 1-2 tablets (325-650 mg total) by mouth every 4 (four) hours as needed for mild pain. 11/28/18  Yes Angiulli, Lavon Paganini, PA-C  aspirin 81 MG chewable tablet Chew 81 mg by mouth daily.   Yes [provider]  Cholecalciferol (VITAMIN D3) 125 MCG (5000 UT) CAPS Take 1 capsule (5,000 Units total) by mouth daily. 11/28/18  Yes Angiulli, Lavon Paganini, PA-C  clonazePAM (KLONOPIN) 0.5 MG tablet Take 1 tablet (0.5 mg total) by mouth at bedtime as needed for anxiety. 11/28/18  Yes Angiulli, Lavon Paganini, PA-C  clopidogrel (PLAVIX) 75 MG tablet Take 1 tablet (75 mg total) by mouth daily. 11/28/18  Yes Angiulli, Lavon Paganini, PA-C  Docusate Calcium (STOOL SOFTENER PO) Take 100 mg by mouth daily.    Yes [provider]  famotidine (PEPCID) 20 MG tablet Take 1 tablet (20 mg total) by mouth 2 (two) times daily. 11/28/18  Yes Angiulli, Lavon Paganini, PA-C  Melatonin 3 MG TABS Take 3 mg by mouth at bedtime.   Yes [provider]  nitroGLYCERIN (NITROSTAT) 0.4 MG SL tablet Place 0.4 mg under the tongue every 5 (five) minutes as needed for chest pain.   Yes [provider]  ondansetron (ZOFRAN) 4 MG tablet Take 4 mg by mouth every 8 (eight) hours as needed for nausea or vomiting.   Yes [provider]  oxybutynin (DITROPAN) 5 MG tablet Take 1 tablet (5 mg total) by mouth 2 (two) times daily. 11/28/18  Yes Angiulli, Lavon Paganini, PA-C  PARoxetine (PAXIL) 30 MG tablet Take 1 tablet (30 mg total) by mouth daily. 11/28/18  Yes Angiulli, Lavon Paganini, PA-C  pregabalin (LYRICA)  150 MG capsule Take 1 capsule (150 mg total) by mouth at bedtime. 11/28/18  Yes Angiulli, Lavon Paganini, PA-C  pregabalin (LYRICA) 75 MG capsule Take 1 capsule (75 mg total) by mouth 2 (two) times daily. 12/13/18  Yes Mayo, Pete Pelt, MD  rosuvastatin (CRESTOR) 20 MG tablet Take 1 tablet (20 mg total) by mouth at bedtime. 11/28/18  Yes Angiulli, Lavon Paganini, PA-C  spironolactone (ALDACTONE) 25 MG tablet Take 0.5 tablets (12.5 mg total) by mouth daily. 11/28/18 11/28/19 Yes Angiulli, Lavon Paganini, PA-C  venlafaxine XR (EFFEXOR-XR) 75 MG 24 hr capsule Take 75 mg by mouth daily with breakfast.   Yes [provider]      VITAL SIGNS:  Blood pressure 110/75, pulse 93, temperature 98.8 F (37.1 C), resp. rate (!) 24, SpO2 94 %.  PHYSICAL EXAMINATION:  Physical Exam  GENERAL:  63 y.o.-year-old patient lying in bed in no acute distress.  EYES: Pupils equal, round, reactive to light and accommodation.  No scleral icterus. Extraocular muscles intact.  HEENT: Head atraumatic, normocephalic. Oropharynx and nasopharynx clear. No oropharyngeal erythema, moist oral mucosa  NECK:  Supple, no jugular venous distention. No thyroid enlargement, no tenderness.  LUNGS: Poor Resp. effort, no wheezing, rales, rhonchi. No use of accessory muscles of respiration.  CARDIOVASCULAR: S1, S2 RRR. No murmurs, rubs, gallops, clicks.  ABDOMEN: Soft, nontender, nondistended. Bowel sounds present. No organomegaly or mass.  EXTREMITIES: + 1 left hand/upper ext. Edema > right.  + 1 lower Ext edema b/l, No cyanosis, or clubbing. + 2 pedal & radial pulses b/l.   NEUROLOGIC: Cranial nerves II through XII are intact. No focal Motor or sensory deficits appreciated b/l. Globally weak.  PSYCHIATRIC: The patient is alert and oriented x 3.  SKIN: No obvious rash, lesion, or ulcer.   LABORATORY PANEL:   CBC Recent Labs  Lab 01/19/19 1744  WBC 10.6*  HGB 9.5*  HCT 29.5*  PLT 219    ------------------------------------------------------------------------------------------------------------------  Chemistries  Recent Labs  Lab 01/19/19 1744  NA 138  K 3.3*  CL 104  CO2 22  GLUCOSE 139*  BUN 14  CREATININE 0.86  CALCIUM 9.7  AST 31  ALT 19  ALKPHOS 76  BILITOT 0.5   ------------------------------------------------------------------------------------------------------------------  Cardiac Enzymes No results for input(s): TROPONINI in the last 168 hours. ------------------------------------------------------------------------------------------------------------------  RADIOLOGY:  Dg Chest Port 1 View  Result Date: 01/19/2019 CLINICAL DATA:  Possible sepsis. EXAM: PORTABLE CHEST 1 VIEW COMPARISON:  Chest radiograph 12/07/2018 FINDINGS: Monitoring leads overlie the patient. Right anterior chest wall Port-A-Cath is present with tip projecting over the superior vena cava. Stable cardiac and mediastinal contours. Interval increase in consolidation within the right mid lower lung. No pleural effusion or pneumothorax. IMPRESSION: Increasing right mid and lower lung consolidation which may represent pneumonia in the appropriate clinical setting. Followup PA and lateral chest X-ray is recommended in 3-4 weeks following trial of antibiotic therapy to ensure resolution and exclude underlying malignancy. Electronically Signed   By: Lovey Newcomer M.D.   On: 01/19/2019 18:14   Dg Hand Complete Left  Result Date: 01/19/2019 CLINICAL DATA:  Left hand edema. Suspected sepsis. No reported injury. EXAM: LEFT HAND - COMPLETE 3+ VIEW COMPARISON:  None. FINDINGS: Peripheral intravenous catheter is noted in the dorsal left wrist soft tissues. No fracture or dislocation. No suspicious focal osseous lesions. No significant arthropathy. No osseous erosions or periosteal reaction. IMPRESSION: No acute osseous abnormality. Electronically Signed   By: Ilona Sorrel M.D.   On: 01/19/2019 18:15      IMPRESSION AND PLAN:   63 y.o. female with a known history of coronary artery disease, CHF, history of previous CVA, GERD, hypertension, hyperlipidemia, mitral valve prolapse, ovarian cancer, obstructive sleep apnea who presents to the hospital initially due to left upper extremity swelling but incidentally ruled in for sepsis due to pneumonia.   1.  Sepsis-patient meets criteria given her fever, tachycardia and chest x-ray findings suggestive of pneumonia. - Patient will be treated with broad-spectrum IV antibiotics with vancomycin, Zosyn.  Follow cultures.  Patient is presently hemodynamically stable.  2.  Pneumonia-source of patient's sepsis. -We will treat patient with IV vancomycin, Zosyn.  Follow cultures. -We will get MRSA PCR to narrow antibiotics.  3.  Hypokalemia-we will supplement and repeat level in the morning.  4.  History of previous CVA- patient was recently hospitalized and was discharged to acute inpatient rehab. - Continue dual antiplatelet therapy with aspirin, Plavix.  Continue Crestor.  5.  History  of ovarian cancer status post chemotherapy-patient is followed by Dr. Tasia Catchings. - Continue follow-up with oncology as an outpatient.  6.  Anxiety-continue Paxil, Effexor. -Continue Klonopin.  7.  Urinary incontinence-continue oxybutynin.  8.  Peripheral neuropathy-continue Lyrica.  All the records are reviewed and case discussed with ED provider. Management plans discussed with the patient, family and they are in agreement.  CODE STATUS: Full code  TOTAL TIME TAKING CARE OF THIS PATIENT: 40 minutes.    Henreitta Leber M.D on 01/19/2019 at 8:54 PM  Between 7am to 6pm - Pager - [phone]  After 6pm go to [email or web address] - password [email or web address] [facility]  [facility]  Office  [phone]  CC: Primary care physician; Destiny Lank, MD

## 2019-01-19 NOTE — ED Provider Notes (Signed)
[facility] Emergency Department Provider Note  ____________________________________________  Time seen: Approximately 5:50 PM  I have reviewed the triage vital signs and the nursing notes.   HISTORY  Chief Complaint Arm Swelling    HPI Destiny [name] is a 63 y.o. female who presents the emergency department complaining of left upper extremity edema x1 month.  Patient reports that she has been concerned that she had a stroke a month ago, has had persistent left upper extremity edema since this diagnosis.  Patient denies any pain to the upper extremity.  No radicular symptoms.  Patient denies any other complaints at this time.  Patient denies any headache, aphasia,    Patient with history of coronary artery disease, CHF, CVA, GERD, hypertension, hypokalemia, ovarian cancer, sleep apnea, diastolic heart failure, cytopenia.       Past Medical History:  Diagnosis Date  . CAD (coronary artery disease)   . CHF (congestive heart failure) ([place])   . CVA (cerebral infarction)   . Female bladder prolapse   . GERD (gastroesophageal reflux disease)   . Hyperlipidemia   . Hypertension   . Hypokalemia   . Mitral valve disease   . Ovarian ca ([place])   . Sleep apnea   . Stroke [facility])    x3    Patient Active Problem List   Diagnosis Date Noted  . HAP (hospital-acquired pneumonia) 12/07/2018  . Acute blood loss anemia   . Chronic systolic congestive heart failure ([place])   . Embolic stroke ([place]) 37/85/8850  . 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 Thrombocytopenia ([place])   . Cerebrovascular accident (CVA) ([place])   . HLD (hyperlipidemia) 11/02/2018  . GERD (gastroesophageal reflux disease) 11/02/2018  . CAD (coronary artery disease) 11/02/2018  . Sleep apnea 11/02/2018  . Hypotension 11/02/2018  . Somnolence 11/02/2018  . Chronic diastolic heart failure ([place]) 11/15/2017  . HTN (hypertension) 11/15/2017  . Hypokalemia 11/05/2017  . Chest pain 02/14/2016  .  Small bowel obstruction ([place]) 06/16/2015    Past Surgical History:  Procedure Laterality Date  . CARPAL TUNNEL RELEASE Bilateral   . CATARACT EXTRACTION Right 11/22/2017  . CHOLECYSTECTOMY    . COLON SURGERY    . TENNIS ELBOW RELEASE/NIRSCHEL PROCEDURE Left   . TONSILLECTOMY      Prior to Admission medications   Medication Sig Start Date End Date Taking? Authorizing Provider  acetaminophen (TYLENOL) 325 MG tablet Take 1-2 tablets (325-650 mg total) by mouth every 4 (four) hours as needed for mild pain. 11/28/18   Angiulli, Lavon Paganini, PA-C  aspirin 81 MG chewable tablet Chew 81 mg by mouth daily.    [provider]  Cholecalciferol (VITAMIN D3) 125 MCG (5000 UT) CAPS Take 1 capsule (5,000 Units total) by mouth daily. 11/28/18   Angiulli, Lavon Paganini, PA-C  clonazePAM (KLONOPIN) 0.5 MG tablet Take 1 tablet (0.5 mg total) by mouth at bedtime as needed for anxiety. 11/28/18   Angiulli, Lavon Paganini, PA-C  clopidogrel (PLAVIX) 75 MG tablet Take 1 tablet (75 mg total) by mouth daily. 11/28/18   Angiulli, Lavon Paganini, PA-C  Docusate Calcium (STOOL SOFTENER PO) Take 100 mg by mouth daily.     [provider]  famotidine (PEPCID) 20 MG tablet Take 1 tablet (20 mg total) by mouth 2 (two) times daily. 11/28/18   Angiulli, Lavon Paganini, PA-C  fluticasone (FLONASE) 50 MCG/ACT nasal spray Place 2 sprays into both nostrils daily as needed for allergies or rhinitis.    [provider]  Melatonin 3 MG TABS Take 3 mg by mouth at bedtime.    [provider]  multivitamin-lutein (OCUVITE-LUTEIN) CAPS capsule Take 1 capsule by mouth daily. 11/10/18   Gladstone Lighter, MD  nitroGLYCERIN (NITROSTAT) 0.4 MG SL tablet Place 0.4 mg under the tongue every 5 (five) minutes as needed for chest pain.    [provider]  oxybutynin (DITROPAN) 5 MG tablet Take 1 tablet (5 mg total) by mouth 2 (two) times daily. 11/28/18   Angiulli, Lavon Paganini, PA-C  PARoxetine (PAXIL) 30 MG tablet Take 1 tablet (30  mg total) by mouth daily. 11/28/18   Angiulli, Lavon Paganini, PA-C  pregabalin (LYRICA) 150 MG capsule Take 1 capsule (150 mg total) by mouth at bedtime. 11/28/18   Angiulli, Lavon Paganini, PA-C  pregabalin (LYRICA) 75 MG capsule Take 1 capsule (75 mg total) by mouth 2 (two) times daily. 12/13/18   Mayo, Pete Pelt, MD  rosuvastatin (CRESTOR) 20 MG tablet Take 1 tablet (20 mg total) by mouth at bedtime. 11/28/18   Angiulli, Lavon Paganini, PA-C  senna (SENOKOT) 8.6 MG TABS tablet Take 2 tablets by mouth at bedtime as needed for mild constipation.    [provider]  spironolactone (ALDACTONE) 25 MG tablet Take 0.5 tablets (12.5 mg total) by mouth daily. 11/28/18 11/28/19  Angiulli, Lavon Paganini, PA-C    Allergies Atorvastatin, Fluconazole, Sertraline, Sulfa antibiotics, Sulfamethoxazole-trimethoprim, and Sulfasalazine  Family History  Problem Relation Age of Onset  . Heart disease Other   . Hypertension Other   . Rheum arthritis Mother   . CAD Father     Social History Social History   Tobacco Use  . Smoking status: Never Smoker  . Smokeless tobacco: Never Used  Substance Use Topics  . Alcohol use: No    Alcohol/week: 0.0 standard drinks  . Drug use: No     Review of Systems  Constitutional: No fever/chills Eyes: No visual changes. No discharge ENT: No upper respiratory complaints. Cardiovascular: no chest pain. Respiratory: no cough. No SOB. Gastrointestinal: Positive for bloody/purulent drainage from surgical site.  Ongoing x3 to 4 months.  No abdominal pain.  No nausea, no vomiting.  No diarrhea.  No constipation. Genitourinary: Negative for dysuria. No hematuria Musculoskeletal: Positive for edema into the left upper extremity Skin: Negative for rash, abrasions, lacerations, ecchymosis. Neurological: Negative for headaches, focal weakness or numbness. 10-point ROS otherwise negative.  ____________________________________________   PHYSICAL EXAM:  VITAL SIGNS: ED Triage Vitals   Enc Vitals Group     BP 01/19/19 1725 107/76     Pulse Rate 01/19/19 1725 (!) 120     Resp 01/19/19 1725 18     Temp 01/19/19 1725 (!) 101.8 F (38.8 C)     Temp Source 01/19/19 1725 Oral     SpO2 01/19/19 1725 92 %     Weight --      Height --      Head Circumference --      Peak Flow --      Pain Score 01/19/19 1726 0     Pain Loc --      Pain Edu? --      Excl. in [place]? --      Constitutional: Alert and oriented. Well appearing and in no acute distress. Eyes: Conjunctivae are normal. PERRL. EOMI. Head: Atraumatic. ENT:      Ears:       Nose: No congestion/rhinnorhea.      Mouth/Throat: Mucous membranes are moist.  Neck: No  stridor.  Hematological/Lymphatic/Immunilogical: No cervical lymphadenopathy. Cardiovascular: Normal rate, regular rhythm. Normal S1 and S2.  Good peripheral circulation. Respiratory: Normal respiratory effort without tachypnea or retractions. Lungs lungs with mild crackles to the right lower lung field.  Otherwise no significant adventitious lung sounds.Kermit Balo air entry to the bases with no decreased or absent breath sounds. Gastrointestinal: Visualization of the abdominal wall reveals surgical incision.  At the superior aspect of the incision, crusted bloody purulent drainage is appreciated.  Area does not reveal any fluctuance.  Area is mildly tender to palpation.  Bowel sounds 4 quadrants. Soft and nontender to palpation. No guarding or rigidity. No palpable masses. No distention. No CVA tenderness. Musculoskeletal: Full range of motion to all extremities. No gross deformities appreciated. Neurologic:  Normal speech and language. No gross focal neurologic deficits are appreciated.  Skin:  Skin is warm, dry and intact. No rash noted. Psychiatric: Mood and affect are normal. Speech and behavior are normal. Patient exhibits appropriate insight and judgement.   ____________________________________________   LABS (all labs ordered are listed, but only  abnormal results are displayed)  Labs Reviewed  COMPREHENSIVE METABOLIC PANEL - Abnormal; Notable for the following components:      Result Value   Potassium 3.3 (*)    Glucose, Bld 139 (*)    All other components within normal limits  LACTIC ACID, PLASMA - Abnormal; Notable for the following components:   Lactic Acid, Venous 2.3 (*)    All other components within normal limits  CBC WITH DIFFERENTIAL/PLATELET - Abnormal; Notable for the following components:   WBC 10.6 (*)    RBC 3.02 (*)    Hemoglobin 9.5 (*)    HCT 29.5 (*)    Monocytes Absolute 1.1 (*)    All other components within normal limits  SARS CORONAVIRUS 2 (HOSPITAL ORDER, [place] [facility])  CULTURE, BLOOD (ROUTINE X 2)  CULTURE, BLOOD (ROUTINE X 2)  URINE CULTURE  PROTIME-INR  LACTIC ACID, PLASMA  URINALYSIS, COMPLETE ([facility]MP) WITH MICROSCOPIC   ____________________________________________  EKG   ____________________________________________  RADIOLOGY I personally viewed and evaluated these images as part of my medical decision making, as well as reviewing the written report by the radiologist.  Dg Chest Port 1 View  Result Date: 01/19/2019 CLINICAL DATA:  Possible sepsis. EXAM: PORTABLE CHEST 1 VIEW COMPARISON:  Chest radiograph 12/07/2018 FINDINGS: Monitoring leads overlie the patient. Right anterior chest wall Port-A-Cath is present with tip projecting over the superior vena cava. Stable cardiac and mediastinal contours. Interval increase in consolidation within the right mid lower lung. No pleural effusion or pneumothorax. IMPRESSION: Increasing right mid and lower lung consolidation which may represent pneumonia in the appropriate clinical setting. Followup PA and lateral chest X-ray is recommended in 3-4 weeks following trial of antibiotic therapy to ensure resolution and exclude underlying malignancy. Electronically Signed   By: Lovey Newcomer M.D.   On: 01/19/2019 18:14   Dg Hand Complete  Left  Result Date: 01/19/2019 CLINICAL DATA:  Left hand edema. Suspected sepsis. No reported injury. EXAM: LEFT HAND - COMPLETE 3+ VIEW COMPARISON:  None. FINDINGS: Peripheral intravenous catheter is noted in the dorsal left wrist soft tissues. No fracture or dislocation. No suspicious focal osseous lesions. No significant arthropathy. No osseous erosions or periosteal reaction. IMPRESSION: No acute osseous abnormality. Electronically Signed   By: Ilona Sorrel M.D.   On: 01/19/2019 18:15    ____________________________________________    PROCEDURES  Procedure(s) performed:    Procedures  Medications  vancomycin (VANCOCIN) IVPB 1000 mg/200 mL premix (has no administration in time range)  sodium chloride flush (NS) 0.9 % injection 3 mL (3 mLs Intravenous Given 01/19/19 1835)  acetaminophen (TYLENOL) tablet 650 mg (650 mg Oral Given 01/19/19 1811)  piperacillin-tazobactam (ZOSYN) IVPB 3.375 g (0 g Intravenous Stopped 01/19/19 1927)     ____________________________________________   INITIAL IMPRESSION / ASSESSMENT AND PLAN / ED COURSE  Pertinent labs & imaging results that were available during my care of the patient were reviewed by me and considered in my medical decision making (see chart for details).  Review of the [place] CSRS was performed in accordance of the [place] prior to dispensing any controlled drugs.           Patient's diagnosis is consistent with community-acquired pneumonia of the right lower lobe.  Patient presented to the emergency department with a complaint of left upper extremity edema.  Patient has had recent admissions where patient presented with 1 complaint, diagnosed and admitted for another.  Given patient's tachycardia, tachypnea, mild hypoxia, febrile status, patient was evaluated with other labs and imaging.  Patient does have an ultrasound pending at this time for the left upper extremity to evaluate for DVT.  However at this time chest x-ray returns with  signs consistent with pneumonia.  This explains patient's symptoms and vital signs.  Patient will be admitted to the hospital service at this time for IV antibiotics.  At this time, imaging is still pending for abdominal wall and left upper extremity.  I discussed the case with the hospitalist service.  With patient having leukocytosis, elevated lactic, findings consistent with pneumonia and vital signs patient will be admitted to the hospital service..  IV vancomycin and Zosyn have been administered in the emergency department.  Fluids have been initiated as well.    ____________________________________________  FINAL CLINICAL IMPRESSION(S) / ED DIAGNOSES  Final diagnoses:  Community acquired pneumonia of right lower lobe of lung ([place])      NEW MEDICATIONS STARTED DURING THIS VISIT:  ED Discharge Orders    None          This chart was dictated using voice recognition software/Dragon. Despite best efforts to proofread, errors can occur which can change the meaning. Any change was purely unintentional.    Darletta Moll, PA-C 01/19/19 2003    Schuyler Amor, MD 01/19/19 2249

## 2019-01-19 NOTE — ED Notes (Signed)
Pt brought into the ed by her husband for left hand swelling x 1 month. Pt states it is painful. Pt found to be febrile in triage and has not taken anything for the fever as she didn't know she had one. She is a poor historian. She denies having the medport listed, but she does have one in her rt chest. Pt denies cough, her pain is only in her rt hand. Pt is shaky and has a hard time following directions.

## 2019-01-19 NOTE — Progress Notes (Signed)
Pharmacy Antibiotic Note  Destiny [name] is a 63 y.o. female admitted on 01/19/2019 with pneumonia.  Pharmacy has been consulted for Vancomycin, Cefepime  dosing.  Plan: Zosyn 3.375 gm IV X 1 given in ED over 30 min on 7/4 @ 1830. Zosyn 3.375 gm IV Q8H EI ordered to start on 7/5 @ 0100.   Vancomycin 1 gm IV X 1 given in ED on 7/4 @ 2024.  Vancomycin 1 gm IV Q24H ordered to start on 7/5 @ 2000.  No peak or trough currently ordered.   AUC = 476.7  Vd = 43.9 L Ke = 0.048 hr-1 T1/2 = 14.5 Vd = 43.9 L   Weight: 203 lb 7.8 oz (92.3 kg)  Temp (24hrs), Avg:100.3 F (37.9 C), Min:98.8 F (37.1 C), Max:101.8 F (38.8 C)  Recent Labs  Lab 01/19/19 1744  WBC 10.6*  CREATININE 0.86  LATICACIDVEN 2.3*    Estimated Creatinine Clearance: 69.9 mL/min (by C-G formula based on SCr of 0.86 mg/dL).    Allergies  Allergen Reactions  . Atorvastatin Hives    No s/sx of anaphylaxis, just intermittent hives.  Does not seem to be a class effect as she has tolerated lovastatin previously No s/sx of anaphylaxis, just intermittent hives.  Does not seem to be a class effect as she has tolerated lovastatin previously  . Fluconazole Rash    Reaction to generic but not name brand Reaction to generic but not name brand CAN USE DIFLUCAN.DOES NOT BREAK OUT WITH DIFLUCAN.(allergic only to generic brand)  . Sertraline Itching  . Sulfa Antibiotics Rash and Other (See Comments)    "hot " sensation  . Sulfamethoxazole-Trimethoprim Rash and Other (See Comments)    "felt hot" Other reaction(s): Other (See Comments) "felt hot"  . Sulfasalazine Other (See Comments) and Rash    "hot " sensation    Antimicrobials this admission:  >>    >>  Dose adjustments this admission:   Microbiology results:  BCx:   UCx:   Sputum:    MRSA PCR:   Thank you for allowing pharmacy to be a part of this patient's care.  Chanteria Haggard D 01/19/2019 9:38 PM

## 2019-01-19 NOTE — ED Notes (Signed)
Pt denied falls but has hx of multiple falls in the last few months.

## 2019-01-20 ENCOUNTER — Encounter: Payer: Self-pay | Admitting: Radiology

## 2019-01-20 ENCOUNTER — Other Ambulatory Visit: Payer: Self-pay

## 2019-01-20 LAB — BASIC METABOLIC PANEL
Anion gap: 8 (ref 5–15)
BUN: 14 mg/dL (ref 8–23)
CO2: 23 mmol/L (ref 22–32)
Calcium: 9.1 mg/dL (ref 8.9–10.3)
Chloride: 108 mmol/L (ref 98–111)
Creatinine, Ser: 0.79 mg/dL (ref 0.44–1.00)
GFR calc Af Amer: >60 mL/min (ref >60)
GFR calc non Af Amer: >60 mL/min (ref >60)
Glucose, Bld: 109 mg/dL — ABNORMAL HIGH (ref 70–99)
Potassium: 4 mmol/L (ref 3.5–5.1)
Sodium: 139 mmol/L (ref 135–145)

## 2019-01-20 LAB — CBC
HCT: 29.2 % — ABNORMAL LOW (ref 36.0–46.0)
Hemoglobin: 9.1 g/dL — ABNORMAL LOW (ref 12.0–15.0)
MCH: 30.7 pg (ref 26.0–34.0)
MCHC: 31.2 g/dL (ref 30.0–36.0)
MCV: 98.6 fL (ref 80.0–100.0)
Platelets: 183 10*3/uL (ref 150–400)
RBC: 2.96 MIL/uL — ABNORMAL LOW (ref 3.87–5.11)
RDW: 13.9 % (ref 11.5–15.5)
WBC: 7.7 10*3/uL (ref 4.0–10.5)
nRBC: 0 % (ref 0.0–0.2)

## 2019-01-20 LAB — MRSA PCR SCREENING: MRSA by PCR: POSITIVE — AB

## 2019-01-20 NOTE — Evaluation (Signed)
Physical Therapy Evaluation Patient Details Name: Destiny [name] MRN: [id number] DOB: 03-15-1956 Today's Date: 01/20/2019   History of Present Illness  Destiny [name] is a 44yoF who comes to [facility] on 7/4 c LUE edema after husband was concerned about suspected CHF exacerbation. Covid test negative, RUE [place] negative. Incidental finding of PNA. 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 for STR, has been home ~3 weeks now. PMH: CAD, CHF, CVA c left hemiplegia, HTN, HOLD, ovarian CA, OSA, GERD.  Clinical Impression  Pt admitted with above diagnosis. Pt currently with functional limitations due to the deficits listed below (see "PT Problem List"). Upon entry, pt in bed, awake and agreeable to participate. The pt is alert and oriented x3, pleasant, and a fair historian. Pt reports to be at baseline for function and mentation, denies any acute complaint. MinA to EOB, supervision to rise with RW, and minA for 150ft AMB which is the longest and most independent AMB seen by rehab here or CIR since she sustained her CVA in April 2020. Functional mobility assessment demonstrates increased effort/time requirements, fair tolerance, and need for physical assistance, whereas the patient performed these at a higher level of independence PTA. Pt has made good progress, but still is unsafe to AMB unassisted, as it is not clear that she has adequate awareness of her limitation in AMB. She has several LOB, particularly once fatigue sets in with a rapid decline in stability and control of RW. Pt will benefit from skilled PT intervention to increase independence and safety with basic mobility in preparation for discharge to the venue listed below.       Follow Up Recommendations Home health PT;Outpatient PT;Other (comment);Supervision for mobility/OOB(depending on accessibility of home and availability of transportation,  OP neuro PT would be an excellent fit, otherwise HHPT.)    Equipment Recommendations  None recommended by PT    Recommendations for Other Services       Precautions / Restrictions Precautions Precautions: None;Fall Restrictions Weight Bearing Restrictions: No      Mobility  Bed Mobility Overal bed mobility: Needs Assistance Bed Mobility: Supine to Sit     Supine to sit: Min assist     General bed mobility comments: given a hand to pull self forward to EOB.  Transfers Overall transfer level: Needs assistance Equipment used: Rolling walker (2 wheeled) Transfers: Sit to/from Stand Sit to Stand: Supervision         General transfer comment: mild to moderate effort, good technique/confidence, no physical assist needed; no LOB upon rising.  Ambulation/Gait Ambulation/Gait assistance: Min guard Gait Distance (Feet): 160 Feet Assistive device: Rolling walker (2 wheeled) Gait Pattern/deviations: Drifts right/left;Trendelenburg;Scissoring Gait velocity: 0.58m/s   General Gait Details: LLE abducted gait, Rt scissoring with trendelenburg failure worsening over 100+ft; after 160ft, control of RW declines, pt needs intermittnet assist and heavy cues to navigate movements with required success. Difficulty with turning left when cued  Stairs            Wheelchair Mobility    Modified Rankin (Stroke Patients Only)       Balance Overall balance assessment: Needs assistance Sitting-balance support: No upper extremity supported;Feet supported Sitting balance-Leahy Scale: Good     Standing balance support: Bilateral upper extremity supported;During functional activity Standing balance-Leahy Scale: Poor  Pertinent Vitals/Pain Pain Assessment: No/denies pain    Home Living Family/patient expects to be discharged to:: Private residence Living Arrangements: Spouse/significant other Available Help at Discharge:  Family;Available 24 hours/day Type of Home: Apartment Home Access: Level entry     Home Layout: One level Home Equipment: Walker - 4 wheels;Bedside commode;Shower seat;Grab bars - tub/shower;Wheelchair - manual      Prior Function Level of Independence: Needs assistance   Gait / Transfers Assistance Needed: Alternate WC/RW use at home, reports her husband tells her to use WC more  ADL's / Homemaking Assistance Needed: needs help with ADL from husband  Comments: Independent without AD prior to 08/2018; 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     Hand Dominance   Dominant Hand: Right    Extremity/Trunk Assessment   Upper Extremity Assessment Upper Extremity Assessment: Generalized weakness;Overall [facility] for tasks assessed    Lower Extremity Assessment Lower Extremity Assessment: Generalized weakness;Overall [facility] for tasks assessed    Cervical / Trunk Assessment Cervical / Trunk Assessment: Normal  Communication   Communication: No difficulties;HOH  Cognition Arousal/Alertness: Awake/alert Behavior During Therapy: WFL for tasks assessed/performed Overall Cognitive Status: Within Functional Limits for tasks assessed                                        General Comments      Exercises     Assessment/Plan    PT Assessment Patient needs continued PT services  PT Problem List Decreased strength;Decreased cognition;Decreased range of motion;Decreased activity tolerance;Decreased balance;Decreased mobility;Decreased coordination       PT Treatment Interventions DME instruction;Balance training;Gait training;Neuromuscular re-education;Cognitive remediation;Functional mobility training;Patient/family education;Therapeutic activities;Therapeutic exercise    PT Goals (Current goals can be found in the Care Plan section)  Acute Rehab PT Goals Patient Stated Goal: go home soon. PT Goal Formulation: With patient Time For Goal Achievement: 02/03/19 Potential to Achieve  Goals: Good    Frequency Min 2X/week   Barriers to discharge        Co-evaluation               AM-PAC PT "6 Clicks" Mobility  Outcome Measure Help needed turning from your back to your side while in a flat bed without using bedrails?: A Little Help needed moving from lying on your back to sitting on the side of a flat bed without using bedrails?: A Little Help needed moving to and from a bed to a chair (including a wheelchair)?: A Little Help needed standing up from a chair using your arms (e.g., wheelchair or bedside chair)?: A Little Help needed to walk in hospital room?: A Little Help needed climbing 3-5 steps with a railing? : A Lot 6 Click Score: 17    End of Session Equipment Utilized During Treatment: Gait belt Activity Tolerance: Patient tolerated treatment well Patient left: in chair;with nursing/sitter in room;with chair alarm set;Other (comment)(pt on telephone) Nurse Communication: Mobility status PT Visit Diagnosis: Unsteadiness on feet (R26.81);Other abnormalities of gait and mobility (R26.89);Hemiplegia and hemiparesis Hemiplegia - Right/Left: Left Hemiplegia - dominant/non-dominant: Non-dominant Hemiplegia - caused by: Cerebral infarction    Time: 0998-3382 PT Time Calculation (min) (ACUTE ONLY): 29 min   Charges:   PT Evaluation $PT Eval Low Complexity: 1 Low PT Treatments $Therapeutic Exercise: 8-22 mins        4:05 PM, 01/20/19 Etta Grandchild, PT, DPT Physical Therapist - [facility]  [phone] ([place])   Kaysi Ourada C 01/20/2019, 4:02 PM

## 2019-01-20 NOTE — Progress Notes (Signed)
[place] [facility] [place] NAME: Destiny [name]    MR#:  [id number]  DATE OF BIRTH:  01/21/1956  SUBJECTIVE:  CHIEF COMPLAINT:   Chief Complaint  Patient presents with   Arm Swelling   The patient feels better. REVIEW OF SYSTEMS:  Review of Systems  Constitutional: Positive for malaise/fatigue. Negative for chills and fever.  HENT: Negative for sore throat.   Eyes: Negative for blurred vision and double vision.  Respiratory: Negative for cough, hemoptysis, shortness of breath, wheezing and stridor.   Cardiovascular: Negative for chest pain, palpitations, orthopnea and leg swelling.  Gastrointestinal: Negative for abdominal pain, blood in stool, diarrhea, melena, nausea and vomiting.  Genitourinary: Negative for dysuria, flank pain and hematuria.  Musculoskeletal: Negative for back pain and joint pain.  Skin: Negative for rash.  Neurological: Negative for dizziness, sensory change, focal weakness, seizures, loss of consciousness, weakness and headaches.  Endo/Heme/Allergies: Negative for polydipsia.  Psychiatric/Behavioral: Negative for depression. The patient is not nervous/anxious.     DRUG ALLERGIES:   Allergies  Allergen Reactions   Atorvastatin Hives    No s/sx of anaphylaxis, just intermittent hives.  Does not seem to be a class effect as she has tolerated lovastatin previously No s/sx of anaphylaxis, just intermittent hives.  Does not seem to be a class effect as she has tolerated lovastatin previously   Fluconazole Rash    Reaction to generic but not name brand Reaction to generic but not name brand CAN USE DIFLUCAN.DOES NOT BREAK OUT WITH DIFLUCAN.(allergic only to generic brand)   Sertraline Itching   Sulfa Antibiotics Rash and Other (See Comments)    "hot " sensation   Sulfamethoxazole-Trimethoprim Rash and Other (See Comments)    "felt hot" Other reaction(s): Other (See Comments) "felt hot"   Sulfasalazine  Other (See Comments) and Rash    "hot " sensation   VITALS:  Blood pressure 104/77, pulse 95, temperature 98.4 F (36.9 C), resp. rate 20, height 5\' 2"  (1.575 m), weight 86.7 kg, SpO2 97 %. PHYSICAL EXAMINATION:  Physical Exam Constitutional:      General: She is not in acute distress.    Appearance: She is obese.  HENT:     Head: Normocephalic.     Mouth/Throat:     Mouth: Mucous membranes are moist.  Eyes:     General: No scleral icterus.    Conjunctiva/sclera: Conjunctivae normal.     Pupils: Pupils are equal, round, and reactive to light.  Neck:     Musculoskeletal: Normal range of motion and neck supple.     Vascular: No JVD.     Trachea: No tracheal deviation.  Cardiovascular:     Rate and Rhythm: Normal rate and regular rhythm.     Heart sounds: Normal heart sounds. No murmur. No gallop.   Pulmonary:     Effort: Pulmonary effort is normal. No respiratory distress.     Breath sounds: Normal breath sounds. No wheezing or rales.  Abdominal:     General: Bowel sounds are normal. There is no distension.     Palpations: Abdomen is soft.     Tenderness: There is no abdominal tenderness. There is no rebound.  Musculoskeletal: Normal range of motion.        General: No tenderness.     Right lower leg: No edema.     Left lower leg: No edema.  Skin:    Findings: No erythema or rash.  Neurological:  General: No focal deficit present.     Mental Status: She is alert and oriented to person, place, and time.     Cranial Nerves: No cranial nerve deficit.  Psychiatric:        Mood and Affect: Mood normal.    LABORATORY PANEL:  Female CBC Recent Labs  Lab 01/20/19 0519  WBC 7.7  HGB 9.1*  HCT 29.2*  PLT 183   ------------------------------------------------------------------------------------------------------------------ Chemistries  Recent Labs  Lab 01/19/19 1744  01/20/19 0519  NA 138  --  139  K 3.3*  --  4.0  CL 104  --  108  CO2 22  --  23  GLUCOSE 139*   --  109*  BUN 14  --  14  CREATININE 0.86   < > 0.79  CALCIUM 9.7  --  9.1  AST 31  --   --   ALT 19  --   --   ALKPHOS 76  --   --   BILITOT 0.5  --   --    < > = values in this interval not displayed.   RADIOLOGY:  US Abdomen Limited  Result Date: 01/19/2019 CLINICAL DATA:  Bloody purulence drainage from surgical site for 2-3 months. EXAM: ULTRASOUND ABDOMEN LIMITED COMPARISON:  None. FINDINGS: There is linear hypoechogenicity in the region of the patient's scar. The finding is at least partially due to scarring. However, a small amount of tracking fluid is not excluded given history. There is hypervascularity in this region. IMPRESSION: 1. Linear hypo vascularity in the region of the patient's scar and drainage is identified. The findings are at least partially due to scarring. However, I do suspect there may be some fluid tracking through this region. No discrete abscess. Hypervascularity is noted. CT imaging could further evaluate for abscess if there is concern. Electronically Signed   By: Dorise Bullion III M.D   On: 01/19/2019 20:54   US Venous Img Upper Uni Left  Result Date: 01/19/2019 CLINICAL DATA:  Left upper extremity swelling. EXAM: LEFT UPPER EXTREMITY VENOUS DOPPLER ULTRASOUND TECHNIQUE: Gray-scale sonography with graded compression, as well as color Doppler and duplex ultrasound were performed to evaluate the upper extremity deep venous system from the level of the subclavian vein and including the jugular, axillary, basilic, radial, ulnar and upper cephalic vein. Spectral Doppler was utilized to evaluate flow at rest and with distal augmentation maneuvers. COMPARISON:  None. FINDINGS: Contralateral Subclavian Vein: Respiratory phasicity is normal and symmetric with the symptomatic side. No evidence of thrombus. Normal compressibility. Internal Jugular Vein: No evidence of thrombus. Normal compressibility, respiratory phasicity and response to augmentation. Subclavian Vein: No  evidence of thrombus. Normal compressibility, respiratory phasicity and response to augmentation. Axillary Vein: No evidence of thrombus. Normal compressibility, respiratory phasicity and response to augmentation. Cephalic Vein: No evidence of thrombus. Normal compressibility, respiratory phasicity and response to augmentation. Basilic Vein: No evidence of thrombus. Normal compressibility, respiratory phasicity and response to augmentation. Brachial Veins: No evidence of thrombus. Normal compressibility, respiratory phasicity and response to augmentation. Radial Veins: No evidence of thrombus. Normal compressibility, respiratory phasicity and response to augmentation. Ulnar Veins: No evidence of thrombus. Normal compressibility, respiratory phasicity and response to augmentation. Venous Reflux:  None visualized. Other Findings:  None visualized. IMPRESSION: No evidence of DVT within the left upper extremity. Electronically Signed   By: Dorise Bullion III M.D   On: 01/19/2019 20:52   Dg Chest Port 1 View  Result Date: 01/19/2019 CLINICAL DATA:  Possible sepsis.  EXAM: PORTABLE CHEST 1 VIEW COMPARISON:  Chest radiograph 12/07/2018 FINDINGS: Monitoring leads overlie the patient. Right anterior chest wall Port-A-Cath is present with tip projecting over the superior vena cava. Stable cardiac and mediastinal contours. Interval increase in consolidation within the right mid lower lung. No pleural effusion or pneumothorax. IMPRESSION: Increasing right mid and lower lung consolidation which may represent pneumonia in the appropriate clinical setting. Followup PA and lateral chest X-ray is recommended in 3-4 weeks following trial of antibiotic therapy to ensure resolution and exclude underlying malignancy. Electronically Signed   By: Lovey Newcomer M.D.   On: 01/19/2019 18:14   Dg Hand Complete Left  Result Date: 01/19/2019 CLINICAL DATA:  Left hand edema. Suspected sepsis. No reported injury. EXAM: LEFT HAND - COMPLETE 3+  VIEW COMPARISON:  None. FINDINGS: Peripheral intravenous catheter is noted in the dorsal left wrist soft tissues. No fracture or dislocation. No suspicious focal osseous lesions. No significant arthropathy. No osseous erosions or periosteal reaction. IMPRESSION: No acute osseous abnormality. Electronically Signed   By: Ilona Sorrel M.D.   On: 01/19/2019 18:15   ASSESSMENT AND PLAN:   63 y.o. female with a known history of coronary artery disease, CHF, history of previous CVA, GERD, hypertension, hyperlipidemia, mitral valve prolapse, ovarian cancer, obstructive sleep apnea who presents to the hospital initially due to left upper extremity swelling but incidentally ruled in for sepsis due to pneumonia.   1.  Sepsis due to pneumonia. Continue vancomycin, Zosyn.  Follow UP cultures.   2.  Hypokalemia.  Improved with supplement.  4.  History of previous CVA- patient was recently hospitalized and was discharged to acute inpatient rehab. - Continue dual antiplatelet therapy with aspirin, Plavix.  Continue Crestor.  5.  History of ovarian cancer status post chemotherapy-patient is followed by Dr. Tasia Catchings. - Continue follow-up with oncology as an outpatient.  6.  Anxiety-continue Paxil, Effexor. -Continue Klonopin.  7.  Urinary incontinence-continue oxybutynin.  8.  Peripheral neuropathy-continue Lyrica.  All the records are reviewed and case discussed with Care Management/Social Worker. Management plans discussed with the patient, family and they are in agreement.  CODE STATUS: Full Code  TOTAL TIME TAKING CARE OF THIS PATIENT: 32 minutes.   More than 50% of the time was spent in counseling/coordination of care: YES  POSSIBLE D/C IN 1-2 DAYS, DEPENDING ON CLINICAL CONDITION.   Demetrios Loll M.D on 01/20/2019 at 9:35 AM  Between 7am to 6pm - Pager - [phone]  After 6pm go to [email or web address] - [facility]

## 2019-01-20 NOTE — TOC Initial Note (Signed)
Transition of Care [facility]) - Initial/Assessment Note    Patient Details  Name: Destiny [name] MRN: [id number] Date of Birth: 1956-01-09  Transition of Care [facility]) CM/SW Contact:    Latanya Maudlin, RN Phone Number: 01/20/2019, 10:17 AM  Clinical Narrative:  [facility] team consulted on patient to complete high risk readmission assessment. Patient is quite lethargic and unable to asnwer most questions. Per chart review spouse handles most medical history inquires. Voicemail left. Completed assessment via chart review and communicating with bedside medical team. Patient is apparently from home but recently stayed at [facility]. [facility] has also used [facility] in the past. Patient had no insurance on previous admission but appears to now have medicaid. Patient lives with her husband in [place]. Husband reported last admission that the patient requires assistance with all ADL's.  Patient has walker, wheelchair, and bedside commode.Patient gets her prescriptions from [facility].                Expected Discharge Plan: [place] Barriers to Discharge: Continued Medical Work up   Patient Goals and CMS Choice        Expected Discharge Plan and Services Expected Discharge Plan: [place]   Discharge Planning Services: CM Consult                                          Prior Living Arrangements/Services     Patient language and need for interpreter reviewed:: Yes        Need for Family Participation in Patient Care: Yes (Comment)(pt with AMS) Care giver support system in place?: Yes (comment) Current home services: DME    Activities of Daily Living Home Assistive Devices/Equipment: Environmental consultant (specify type), Wheelchair, Grab bars in shower, Shower chair without back ADL Screening (condition at time of admission) Patient's cognitive ability adequate to safely complete daily activities?: Yes Is the patient deaf or have difficulty  hearing?: Yes Does the patient have difficulty seeing, even when wearing glasses/contacts?: No Does the patient have difficulty concentrating, remembering, or making decisions?: Yes Patient able to express need for assistance with ADLs?: Yes Does the patient have difficulty dressing or bathing?: No Independently performs ADLs?: Yes (appropriate for developmental age) Does the patient have difficulty walking or climbing stairs?: Yes Weakness of Legs: Left Weakness of Arms/Hands: Left  Permission Sought/Granted Permission sought to share information with : Case Manager                Emotional Assessment Appearance:: Appears stated age Attitude/Demeanor/Rapport: Lethargic   Orientation: : Oriented to Self, Fluctuating Orientation (Suspected and/or reported Sundowners)      Admission diagnosis:  Sepsis ([place]) [A41.9] Community acquired pneumonia of right lower lobe of lung ([place]) [J18.1] Patient Active Problem List   Diagnosis Date Noted  . Pneumonia 01/19/2019  . HAP (hospital-acquired pneumonia) 12/07/2018  . Acute blood loss anemia   . Chronic systolic congestive heart failure ([place])   . Embolic stroke ([place]) 36/14/4315  . 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 Thrombocytopenia ([place])   . Cerebrovascular accident (CVA) ([place])   . HLD (hyperlipidemia) 11/02/2018  . GERD (gastroesophageal reflux disease) 11/02/2018  . CAD (coronary artery disease) 11/02/2018  . Sleep apnea 11/02/2018  . Hypotension 11/02/2018  . Somnolence 11/02/2018  . Chronic diastolic heart failure ([place]) 11/15/2017  . HTN (hypertension) 11/15/2017  .  Hypokalemia 11/05/2017  . Chest pain 02/14/2016  . Small bowel obstruction ([place]) 06/16/2015   PCP:  [name], MD Pharmacy:   [place], [place] - [address] [place] [address] Phone: [phone] Fax: [place], [address] [place] [address] Phone: [phone] Fax: [phone]     Social Determinants of Health (SDOH) Interventions    Readmission Risk Interventions Readmission Risk Prevention Plan 01/20/2019 12/13/2018  Transportation Screening - Complete  Medication Review (RN Care Manager) Complete Complete  PCP or Specialist appointment within 3-5 days of discharge - Complete  HRI or [place] - Complete  SW Recovery Care/Counseling Consult - Complete  [place] - Complete  Some recent data might be hidden

## 2019-01-21 LAB — CREATININE, SERUM
Creatinine, Ser: 0.96 mg/dL (ref 0.44–1.00)
GFR calc Af Amer: >60 mL/min (ref >60)
GFR calc non Af Amer: >60 mL/min (ref >60)

## 2019-01-21 MED ORDER — AMOXICILLIN-POT CLAVULANATE 875-125 MG PO TABS: 1.0000 | ORAL_TABLET | Freq: Two times a day (BID) | ORAL | Status: DC

## 2019-01-21 MED ORDER — AMOXICILLIN-POT CLAVULANATE 875-125 MG PO TABS: 1.0000 | ORAL_TABLET | Freq: Two times a day (BID) | ORAL | 0 refills | Status: DC

## 2019-01-21 MED ORDER — DOXYCYCLINE HYCLATE 100 MG PO TABS: 100.0000 mg | ORAL_TABLET | Freq: Two times a day (BID) | ORAL | Status: DC

## 2019-01-21 MED ORDER — DOXYCYCLINE HYCLATE 100 MG PO TABS: 100.0000 mg | ORAL_TABLET | Freq: Two times a day (BID) | ORAL | 0 refills | Status: DC

## 2019-01-21 NOTE — Discharge Summary (Signed)
[facility] NAME: Destiny [name]    MR#:  [id number]  DATE OF BIRTH:  1956-03-21  DATE OF ADMISSION:  01/19/2019 ADMITTING PHYSICIAN: Henreitta Leber, MD  DATE OF DISCHARGE: 01/21/2019 12:10 PM  PRIMARY CARE PHYSICIAN: [name], MD    ADMISSION DIAGNOSIS:  Sepsis ([place]) [A41.9] Community acquired pneumonia of right lower lobe of lung ([place]) [J18.1]  DISCHARGE DIAGNOSIS:  Active Problems:   Pneumonia   SECONDARY DIAGNOSIS:   Past Medical History:  Diagnosis Date  . CAD (coronary artery disease)   . CHF (congestive heart failure) ([place])   . CVA (cerebral infarction)   . Female bladder prolapse   . GERD (gastroesophageal reflux disease)   . Hyperlipidemia   . Hypertension   . Hypokalemia   . Mitral valve disease   . Ovarian ca ([place])   . Sleep apnea   . Stroke [facility])    x3    HOSPITAL COURSE:   1.  Clinical sepsis with pneumonia.  The patient was started on vancomycin and Zosyn.  MRSA PCR was positive.  Blood cultures negative x2.  Pneumonia is now treated with 5 days of antibiotics.  Antibiotics switched over to doxycycline and Augmentin twice daily for another 7 doses starting tonight. 2.  Hypokalemia improved. 3.  History of CVA.  On antiplatelet therapy with aspirin Plavix and statin therapy with Crestor 4.  History of ovarian cancer status post chemotherapy followed by Dr. Tasia Catchings as outpatient.  Patient does have an area on her abdomen that has scabbing and intermittent drainage. 5.  Anxiety on Paxil and Effexor and Klonopin 6.  Urinary incontinence on oxybutynin 7.  Peripheral neuropathy on Lyrica  DISCHARGE CONDITIONS:   Satisfactory  CONSULTS OBTAINED:  None  DRUG ALLERGIES:   Allergies  Allergen Reactions  . Atorvastatin Hives    No s/sx of anaphylaxis, just intermittent hives.  Does not seem to be a class effect as she has tolerated lovastatin previously No s/sx of anaphylaxis, just intermittent hives.   Does not seem to be a class effect as she has tolerated lovastatin previously  . Fluconazole Rash    Reaction to generic but not name brand Reaction to generic but not name brand CAN USE DIFLUCAN.DOES NOT BREAK OUT WITH DIFLUCAN.(allergic only to generic brand)  . Sertraline Itching  . Sulfa Antibiotics Rash and Other (See Comments)    "hot " sensation  . Sulfamethoxazole-Trimethoprim Rash and Other (See Comments)    "felt hot" Other reaction(s): Other (See Comments) "felt hot"  . Sulfasalazine Other (See Comments) and Rash    "hot " sensation    DISCHARGE MEDICATIONS:   Allergies as of 01/21/2019      Reactions   Atorvastatin Hives   No s/sx of anaphylaxis, just intermittent hives.  Does not seem to be a class effect as she has tolerated lovastatin previously No s/sx of anaphylaxis, just intermittent hives.  Does not seem to be a class effect as she has tolerated lovastatin previously   Fluconazole Rash   Reaction to generic but not name brand Reaction to generic but not name brand CAN USE DIFLUCAN.DOES NOT BREAK OUT WITH DIFLUCAN.(allergic only to generic brand)   Sertraline Itching   Sulfa Antibiotics Rash, Other (See Comments)   "hot " sensation   Sulfamethoxazole-trimethoprim Rash, Other (See Comments)   "felt hot" Other reaction(s): Other (See Comments) "felt hot"   Sulfasalazine Other (See Comments), Rash   "hot " sensation  Medication List    TAKE these medications   acetaminophen 325 MG tablet Commonly known as: TYLENOL Take 1-2 tablets (325-650 mg total) by mouth every 4 (four) hours as needed for mild pain.   amoxicillin-clavulanate 875-125 MG tablet Commonly known as: AUGMENTIN Take 1 tablet by mouth every 12 (twelve) hours.   aspirin 81 MG chewable tablet Chew 81 mg by mouth daily.   clonazePAM 0.5 MG tablet Commonly known as: KLONOPIN Take 1 tablet (0.5 mg total) by mouth at bedtime as needed for anxiety.   clopidogrel 75 MG tablet Commonly  known as: PLAVIX Take 1 tablet (75 mg total) by mouth daily.   doxycycline 100 MG tablet Commonly known as: VIBRA-TABS Take 1 tablet (100 mg total) by mouth every 12 (twelve) hours.   famotidine 20 MG tablet Commonly known as: PEPCID Take 1 tablet (20 mg total) by mouth 2 (two) times daily.   Melatonin 3 MG Tabs Take 3 mg by mouth at bedtime.   nitroGLYCERIN 0.4 MG SL tablet Commonly known as: NITROSTAT Place 0.4 mg under the tongue every 5 (five) minutes as needed for chest pain.   ondansetron 4 MG tablet Commonly known as: ZOFRAN Take 4 mg by mouth every 8 (eight) hours as needed for nausea or vomiting.   oxybutynin 5 MG tablet Commonly known as: DITROPAN Take 1 tablet (5 mg total) by mouth 2 (two) times daily.   PARoxetine 30 MG tablet Commonly known as: PAXIL Take 1 tablet (30 mg total) by mouth daily.   pregabalin 150 MG capsule Commonly known as: LYRICA Take 1 capsule (150 mg total) by mouth at bedtime.   pregabalin 75 MG capsule Commonly known as: LYRICA Take 1 capsule (75 mg total) by mouth 2 (two) times daily.   rosuvastatin 20 MG tablet Commonly known as: CRESTOR Take 1 tablet (20 mg total) by mouth at bedtime.   spironolactone 25 MG tablet Commonly known as: ALDACTONE Take 0.5 tablets (12.5 mg total) by mouth daily.   STOOL SOFTENER PO Take 100 mg by mouth daily.   venlafaxine XR 75 MG 24 hr capsule Commonly known as: EFFEXOR-XR Take 75 mg by mouth daily with breakfast.   Vitamin D3 125 MCG (5000 UT) Caps Take 1 capsule (5,000 Units total) by mouth daily.        DISCHARGE INSTRUCTIONS:   Follow-up PMD 5 days  If you experience worsening of your admission symptoms, develop shortness of breath, life threatening emergency, suicidal or homicidal thoughts you must seek medical attention immediately by calling 911 or calling your MD immediately  if symptoms less severe.  You Must read complete instructions/literature along with all the possible  adverse reactions/side effects for all the Medicines you take and that have been prescribed to you. Take any new Medicines after you have completely understood and accept all the possible adverse reactions/side effects.   Please note  You were cared for by a hospitalist during your hospital stay. If you have any questions about your discharge medications or the care you received while you were in the hospital after you are discharged, you can call the unit and asked to speak with the hospitalist on call if the hospitalist that took care of you is not available. Once you are discharged, your primary care physician will handle any further medical issues. Please note that NO REFILLS for any discharge medications will be authorized once you are discharged, as it is imperative that you return to your primary care physician (or establish a  relationship with a primary care physician if you do not have one) for your aftercare needs so that they can reassess your need for medications and monitor your lab values.    Today   CHIEF COMPLAINT:   Chief Complaint  Patient presents with  . Arm Swelling    HISTORY OF PRESENT ILLNESS:  Janara Klett  is a 63 y.o. female came in with arm swelling found to have pneumonia   VITAL SIGNS:  Blood pressure 107/78, pulse 91, temperature 98.5 F (36.9 C), resp. rate 18, height 5\' 2"  (1.575 m), weight 86.7 kg, SpO2 96 %.    PHYSICAL EXAMINATION:  GENERAL:  63 y.o.-year-old patient lying in the bed with no acute distress.  EYES: Pupils equal, round, reactive to light and accommodation. No scleral icterus. Extraocular muscles intact.  HEENT: Head atraumatic, normocephalic. Oropharynx and nasopharynx clear.  NECK:  Supple, no jugular venous distention. No thyroid enlargement, no tenderness.  LUNGS: Normal breath sounds bilaterally, no wheezing, rales,rhonchi or crepitation. No use of accessory muscles of respiration.  CARDIOVASCULAR: S1, S2 normal. No murmurs,  rubs, or gallops.  ABDOMEN: Soft, non-tender, non-distended. Bowel sounds present. No organomegaly or mass.  EXTREMITIES: No pedal edema, cyanosis, or clubbing.  NEUROLOGIC: Cranial nerves II through XII are intact. Muscle strength 5/5 in all extremities. Sensation intact. Gait not checked.  PSYCHIATRIC: The patient is alert and oriented x 3.  SKIN: No obvious rash, lesion, or ulcer.   DATA REVIEW:   CBC Recent Labs  Lab 01/20/19 0519  WBC 7.7  HGB 9.1*  HCT 29.2*  PLT 183    Chemistries  Recent Labs  Lab 01/19/19 1744  01/20/19 0519 01/21/19 0638  NA 138  --  139  --   K 3.3*  --  4.0  --   CL 104  --  108  --   CO2 22  --  23  --   GLUCOSE 139*  --  109*  --   BUN 14  --  14  --   CREATININE 0.86   < > 0.79 0.96  CALCIUM 9.7  --  9.1  --   AST 31  --   --   --   ALT 19  --   --   --   ALKPHOS 76  --   --   --   BILITOT 0.5  --   --   --    < > = values in this interval not displayed.     Microbiology Results  Results for orders placed or performed during the hospital encounter of 01/19/19  Culture, blood (Routine x 2)     Status: None (Preliminary result)   Collection Time: 01/19/19  5:44 PM   Specimen: BLOOD  Result Value Ref Range Status   Specimen Description BLOOD BLOOD LEFT WRIST  Final   Special Requests   Final    BOTTLES DRAWN AEROBIC AND ANAEROBIC Blood Culture adequate volume   Culture   Final    NO GROWTH 2 DAYS Performed at [facility], [address], [place], [place] [address]    Report Status PENDING  Incomplete  Culture, blood (Routine x 2)     Status: None (Preliminary result)   Collection Time: 01/19/19  5:45 PM   Specimen: BLOOD  Result Value Ref Range Status   Specimen Description BLOOD BLOOD RIGHT FOREARM  Final   Special Requests   Final    BOTTLES DRAWN AEROBIC AND ANAEROBIC Blood Culture adequate  volume   Culture   Final    NO GROWTH 2 DAYS Performed at [facility], [address] [place], [place]  [address]    Report Status PENDING  Incomplete  SARS Coronavirus 2 (CEPHEID- Performed in [place] hospital lab), Hosp Order     Status: None   Collection Time: 01/19/19  5:45 PM   Specimen: Nasopharyngeal Swab  Result Value Ref Range Status   SARS Coronavirus 2 NEGATIVE NEGATIVE Final    Comment: (NOTE) If result is NEGATIVE SARS-CoV-2 target nucleic acids are NOT DETECTED. The SARS-CoV-2 RNA is generally detectable in upper and lower  respiratory specimens during the acute phase of infection. The lowest  concentration of SARS-CoV-2 viral copies this assay can detect is 250  copies / mL. A negative result does not preclude SARS-CoV-2 infection  and should not be used as the sole basis for treatment or other  patient management decisions.  A negative result may occur with  improper specimen collection / handling, submission of specimen other  than nasopharyngeal swab, presence of viral mutation(s) within the  areas targeted by this assay, and inadequate number of viral copies  (<250 copies / mL). A negative result must be combined with clinical  observations, patient history, and epidemiological information. If result is POSITIVE SARS-CoV-2 target nucleic acids are DETECTED. The SARS-CoV-2 RNA is generally detectable in upper and lower  respiratory specimens dur ing the acute phase of infection.  Positive  results are indicative of active infection with SARS-CoV-2.  Clinical  correlation with patient history and other diagnostic information is  necessary to determine patient infection status.  Positive results do  not rule out bacterial infection or co-infection with other viruses. If result is PRESUMPTIVE POSTIVE SARS-CoV-2 nucleic acids MAY BE PRESENT.   A presumptive positive result was obtained on the submitted specimen  and confirmed on repeat testing.  While 2019 novel coronavirus  (SARS-CoV-2) nucleic acids may be present in the submitted sample  additional confirmatory  testing may be necessary for epidemiological  and / or clinical management purposes  to differentiate between  SARS-CoV-2 and other Sarbecovirus currently known to infect humans.  If clinically indicated additional testing with an alternate test  methodology [phone]) is advised. The SARS-CoV-2 RNA is generally  detectable in upper and lower respiratory sp ecimens during the acute  phase of infection. The expected result is Negative. Fact Sheet for Patients:  StrictlyIdeas.no Fact Sheet for Healthcare Providers: BankingDealers.co.za This test is not yet approved or cleared by the [place] FDA and has been authorized for detection and/or diagnosis of SARS-CoV-2 by FDA under an Emergency Use Authorization (EUA).  This EUA will remain in effect (meaning this test can be used) for the duration of the COVID-19 declaration under Section 564(b)(1) of the Act, 21 U.S.C. section 360bbb-3(b)(1), unless the authorization is terminated or revoked sooner. Performed at [facility], [address], [place], [place] [address]   Urine culture     Status: Abnormal (Preliminary result)   Collection Time: 01/19/19  5:45 PM   Specimen: Urine, Random  Result Value Ref Range Status   Specimen Description   Final    URINE, RANDOM Performed at [facility], [address], [place], [place] [address]    Special Requests   Final    NONE Performed at [facility], [address] [place], [place] [address]    Culture (A)  Final    40,000 COLONIES/mL ENTEROCOCCUS FAECIUM 10,000 COLONIES/mL STAPHYLOCOCCUS  AUREUS    Report Status PENDING  Incomplete  MRSA PCR Screening     Status: Abnormal   Collection Time: 01/20/19  6:35 AM   Specimen: Nasopharyngeal  Result Value Ref Range Status   MRSA by PCR POSITIVE (A) NEGATIVE Final    Comment:        The GeneXpert MRSA Assay (FDA approved for NASAL specimens only), is one  component of a comprehensive MRSA colonization surveillance program. It is not intended to diagnose MRSA infection nor to guide or monitor treatment for MRSA infections. RESULT CALLED TO, READ BACK BY AND VERIFIED WITH: C/MEGHAN OAKLEY AT 0800 01/20/2019.PMF Performed at [facility], [address] [place], [place] [address]     RADIOLOGY:  US Abdomen Limited  Result Date: 01/19/2019 CLINICAL DATA:  Bloody purulence drainage from surgical site for 2-3 months. EXAM: ULTRASOUND ABDOMEN LIMITED COMPARISON:  None. FINDINGS: There is linear hypoechogenicity in the region of the patient's scar. The finding is at least partially due to scarring. However, a small amount of tracking fluid is not excluded given history. There is hypervascularity in this region. IMPRESSION: 1. Linear hypo vascularity in the region of the patient's scar and drainage is identified. The findings are at least partially due to scarring. However, I do suspect there may be some fluid tracking through this region. No discrete abscess. Hypervascularity is noted. CT imaging could further evaluate for abscess if there is concern. Electronically Signed   By: Dorise Bullion III M.D   On: 01/19/2019 20:54   US Venous Img Upper Uni Left  Result Date: 01/19/2019 CLINICAL DATA:  Left upper extremity swelling. EXAM: LEFT UPPER EXTREMITY VENOUS DOPPLER ULTRASOUND TECHNIQUE: Gray-scale sonography with graded compression, as well as color Doppler and duplex ultrasound were performed to evaluate the upper extremity deep venous system from the level of the subclavian vein and including the jugular, axillary, basilic, radial, ulnar and upper cephalic vein. Spectral Doppler was utilized to evaluate flow at rest and with distal augmentation maneuvers. COMPARISON:  None. FINDINGS: Contralateral Subclavian Vein: Respiratory phasicity is normal and symmetric with the symptomatic side. No evidence of thrombus. Normal compressibility. Internal  Jugular Vein: No evidence of thrombus. Normal compressibility, respiratory phasicity and response to augmentation. Subclavian Vein: No evidence of thrombus. Normal compressibility, respiratory phasicity and response to augmentation. Axillary Vein: No evidence of thrombus. Normal compressibility, respiratory phasicity and response to augmentation. Cephalic Vein: No evidence of thrombus. Normal compressibility, respiratory phasicity and response to augmentation. Basilic Vein: No evidence of thrombus. Normal compressibility, respiratory phasicity and response to augmentation. Brachial Veins: No evidence of thrombus. Normal compressibility, respiratory phasicity and response to augmentation. Radial Veins: No evidence of thrombus. Normal compressibility, respiratory phasicity and response to augmentation. Ulnar Veins: No evidence of thrombus. Normal compressibility, respiratory phasicity and response to augmentation. Venous Reflux:  None visualized. Other Findings:  None visualized. IMPRESSION: No evidence of DVT within the left upper extremity. Electronically Signed   By: Dorise Bullion III M.D   On: 01/19/2019 20:52   Dg Chest Port 1 View  Result Date: 01/19/2019 CLINICAL DATA:  Possible sepsis. EXAM: PORTABLE CHEST 1 VIEW COMPARISON:  Chest radiograph 12/07/2018 FINDINGS: Monitoring leads overlie the patient. Right anterior chest wall Port-A-Cath is present with tip projecting over the superior vena cava. Stable cardiac and mediastinal contours. Interval increase in consolidation within the right mid lower lung. No pleural effusion or pneumothorax. IMPRESSION: Increasing right mid and lower lung consolidation which may represent pneumonia in the appropriate clinical  setting. Followup PA and lateral chest X-ray is recommended in 3-4 weeks following trial of antibiotic therapy to ensure resolution and exclude underlying malignancy. Electronically Signed   By: Lovey Newcomer M.D.   On: 01/19/2019 18:14   Dg Hand  Complete Left  Result Date: 01/19/2019 CLINICAL DATA:  Left hand edema. Suspected sepsis. No reported injury. EXAM: LEFT HAND - COMPLETE 3+ VIEW COMPARISON:  None. FINDINGS: Peripheral intravenous catheter is noted in the dorsal left wrist soft tissues. No fracture or dislocation. No suspicious focal osseous lesions. No significant arthropathy. No osseous erosions or periosteal reaction. IMPRESSION: No acute osseous abnormality. Electronically Signed   By: Ilona Sorrel M.D.   On: 01/19/2019 18:15    Management plans discussed with the patient, family and they are in agreement.  CODE STATUS:     Code Status Orders  (From admission, onward)         Start     Ordered   01/19/19 2231  Full code  Continuous     01/19/19 2231        Code Status History    Date Active Date Inactive Code Status Order ID Comments User Context   12/07/2018 1535 12/13/2018 2205 Full Code [id number]  Demetrios Loll, MD Inpatient   11/09/2018 1522 11/29/2018 1232 Full Code [id number]  Flora Lipps Inpatient   11/02/2018 0448 11/09/2018 1459 Full Code [id number]  Lance Coon, MD Inpatient   11/13/2017 0804 11/14/2017 1819 Full Code [id number]  Loletha Grayer, MD ED   11/05/2017 1638 11/06/2017 2025 Full Code [id number]  Salary, Avel Peace, MD Inpatient   02/05/2017 0035 02/05/2017 2254 Full Code [id number]  Valinda Party, DO Inpatient   02/15/2016 0806 02/15/2016 2136 Full Code [id number]  Hillary Bow, MD Inpatient   02/14/2016 1728 02/15/2016 0747 Full Code [id number]  Dustin Flock, MD ED   06/16/2015 1753 06/19/2015 1822 Full Code [id number]  Clayburn Pert, MD Inpatient   [facility] Care Planning Activity      TOTAL TIME TAKING CARE OF THIS PATIENT: 35 minutes.    Loletha Grayer M.D on 01/21/2019 at 2:22 PM  Between 7am to 6pm - Pager - [phone]  After 6pm go to [email or web address] - password [email or web address] [facility]  [phone]  CC: Primary care physician; [name], MD

## 2019-01-21 NOTE — TOC Transition Note (Signed)
Transition of Care [facility]) - CM/SW Discharge Note   Patient Details  Name: Destiny [name] MRN: [id number] Date of Birth: 03-16-1956  [facility] CM/SW Contact:  Shelbie Hutching, RN Phone Number: 01/21/2019, 9:46 AM   Clinical Narrative:    Patient is ready for discharge, husband will come to pick her up.  Patient chooses [place] with [facility] given referral.  Patient has all needed DME already at home.     Final next level of care: [place] Barriers to Discharge: Barriers Resolved   Patient Goals and CMS Choice Patient states their goals for this hospitalization and ongoing recovery are:: Return home with home health CMS Medicare.gov Compare Post Acute Care list provided to:: Patient Choice offered to / list presented to : Patient  Discharge Placement                       Discharge Plan and Services   Discharge Planning Services: CM Consult                      HH Arranged: RN, PT, OT, Nurse's Aide, Social Work [employer] Agency: [place] ([facility]) Date [place]: 01/21/19 Time [place]: [phone] Representative spoke with at [place]: [place] ([place]) Interventions     Readmission Risk Interventions Readmission Risk Prevention Plan 01/20/2019 12/13/2018  Transportation Screening - Complete  Medication Review Press photographer) Complete Complete  PCP or Specialist appointment within 3-5 days of discharge - Complete  HRI or [place] - Complete  SW Recovery Care/Counseling Consult - Complete  [place] - Complete  Some recent data might be hidden

## 2019-01-22 ENCOUNTER — Other Ambulatory Visit: Payer: Self-pay

## 2019-01-22 LAB — URINE CULTURE: Culture: 40000 — AB

## 2019-01-23 ENCOUNTER — Other Ambulatory Visit: Payer: Self-pay

## 2019-01-23 ENCOUNTER — Other Ambulatory Visit: Payer: Self-pay | Admitting: Oncology

## 2019-01-23 ENCOUNTER — Ambulatory Visit
Admission: RE | Admit: 2019-01-23 | Discharge: 2019-01-23 | Disposition: A | Discharge status: Home / Self Care | Payer: Medicaid Other | Source: Ambulatory Visit | Attending: Oncology | Admitting: Oncology

## 2019-01-23 ENCOUNTER — Ambulatory Visit: Admission: RE | Admit: 2019-01-23 | Payer: Medicaid Other | Source: Ambulatory Visit | Admitting: Oncology

## 2019-01-23 ENCOUNTER — Ambulatory Visit: Payer: Medicaid Other

## 2019-01-23 ENCOUNTER — Inpatient Hospital Stay: Payer: Medicaid Other | Attending: Oncology | Admitting: Oncology

## 2019-01-23 ENCOUNTER — Encounter: Payer: Self-pay | Admitting: Oncology

## 2019-01-23 VITALS — BP 121/83 | HR 105 | Temp 99.3°F | Resp 18 | Wt 174.4 lb

## 2019-01-23 DIAGNOSIS — Z7982 Long term (current) use of aspirin: Secondary | ICD-10-CM

## 2019-01-23 DIAGNOSIS — C569 Malignant neoplasm of unspecified ovary: Secondary | ICD-10-CM | POA: Diagnosis not present

## 2019-01-23 DIAGNOSIS — Z79899 Other long term (current) drug therapy: Secondary | ICD-10-CM | POA: Diagnosis not present

## 2019-01-23 DIAGNOSIS — F329 Major depressive disorder, single episode, unspecified: Secondary | ICD-10-CM | POA: Diagnosis not present

## 2019-01-23 DIAGNOSIS — D649 Anemia, unspecified: Secondary | ICD-10-CM

## 2019-01-23 DIAGNOSIS — I69334 Monoplegia of upper limb following cerebral infarction affecting left non-dominant side: Secondary | ICD-10-CM | POA: Diagnosis not present

## 2019-01-23 DIAGNOSIS — D509 Iron deficiency anemia, unspecified: Secondary | ICD-10-CM

## 2019-01-23 DIAGNOSIS — I6782 Cerebral ischemia: Secondary | ICD-10-CM | POA: Diagnosis not present

## 2019-01-23 DIAGNOSIS — E46 Unspecified protein-calorie malnutrition: Secondary | ICD-10-CM | POA: Diagnosis not present

## 2019-01-23 DIAGNOSIS — I058 Other rheumatic mitral valve diseases: Secondary | ICD-10-CM | POA: Diagnosis not present

## 2019-01-23 DIAGNOSIS — I110 Hypertensive heart disease with heart failure: Secondary | ICD-10-CM | POA: Diagnosis not present

## 2019-01-23 DIAGNOSIS — Z7901 Long term (current) use of anticoagulants: Secondary | ICD-10-CM | POA: Diagnosis not present

## 2019-01-23 DIAGNOSIS — E785 Hyperlipidemia, unspecified: Secondary | ICD-10-CM

## 2019-01-23 DIAGNOSIS — M25512 Pain in left shoulder: Secondary | ICD-10-CM

## 2019-01-23 DIAGNOSIS — Z90722 Acquired absence of ovaries, bilateral: Secondary | ICD-10-CM | POA: Diagnosis not present

## 2019-01-23 DIAGNOSIS — I5042 Chronic combined systolic (congestive) and diastolic (congestive) heart failure: Secondary | ICD-10-CM | POA: Diagnosis not present

## 2019-01-23 DIAGNOSIS — R5383 Other fatigue: Secondary | ICD-10-CM

## 2019-01-23 DIAGNOSIS — I2510 Atherosclerotic heart disease of native coronary artery without angina pectoris: Secondary | ICD-10-CM | POA: Diagnosis not present

## 2019-01-23 DIAGNOSIS — Z955 Presence of coronary angioplasty implant and graft: Secondary | ICD-10-CM | POA: Diagnosis not present

## 2019-01-23 DIAGNOSIS — Z9221 Personal history of antineoplastic chemotherapy: Secondary | ICD-10-CM | POA: Diagnosis not present

## 2019-01-23 DIAGNOSIS — C786 Secondary malignant neoplasm of retroperitoneum and peritoneum: Secondary | ICD-10-CM | POA: Diagnosis not present

## 2019-01-23 DIAGNOSIS — H9190 Unspecified hearing loss, unspecified ear: Secondary | ICD-10-CM

## 2019-01-23 DIAGNOSIS — R531 Weakness: Secondary | ICD-10-CM

## 2019-01-23 DIAGNOSIS — G4733 Obstructive sleep apnea (adult) (pediatric): Secondary | ICD-10-CM | POA: Diagnosis not present

## 2019-01-23 DIAGNOSIS — K219 Gastro-esophageal reflux disease without esophagitis: Secondary | ICD-10-CM | POA: Diagnosis not present

## 2019-01-23 LAB — CBC WITH DIFFERENTIAL/PLATELET
Abs Immature Granulocytes: 0.06 10*3/uL (ref 0.00–0.07)
Basophils Absolute: 0 10*3/uL (ref 0.0–0.1)
Basophils Relative: 0 %
Eosinophils Absolute: 0.2 10*3/uL (ref 0.0–0.5)
Eosinophils Relative: 3 %
HCT: 29.7 % — ABNORMAL LOW (ref 36.0–46.0)
Hemoglobin: 9.5 g/dL — ABNORMAL LOW (ref 12.0–15.0)
Immature Granulocytes: 1 %
Lymphocytes Relative: 17 %
Lymphs Abs: 1.4 10*3/uL (ref 0.7–4.0)
MCH: 31.4 pg (ref 26.0–34.0)
MCHC: 32 g/dL (ref 30.0–36.0)
MCV: 98 fL (ref 80.0–100.0)
Monocytes Absolute: 0.8 10*3/uL (ref 0.1–1.0)
Monocytes Relative: 10 %
Neutro Abs: 5.5 10*3/uL (ref 1.7–7.7)
Neutrophils Relative %: 69 %
Platelets: 206 10*3/uL (ref 150–400)
RBC: 3.03 MIL/uL — ABNORMAL LOW (ref 3.87–5.11)
RDW: 13.6 % (ref 11.5–15.5)
WBC: 7.9 10*3/uL (ref 4.0–10.5)
nRBC: 0 % (ref 0.0–0.2)

## 2019-01-23 LAB — COMPREHENSIVE METABOLIC PANEL
ALT: 18 U/L (ref 0–44)
AST: 27 U/L (ref 15–41)
Albumin: 3.7 g/dL (ref 3.5–5.0)
Alkaline Phosphatase: 68 U/L (ref 38–126)
Anion gap: 9 (ref 5–15)
BUN: 18 mg/dL (ref 8–23)
CO2: 25 mmol/L (ref 22–32)
Calcium: 9.6 mg/dL (ref 8.9–10.3)
Chloride: 106 mmol/L (ref 98–111)
Creatinine, Ser: 0.82 mg/dL (ref 0.44–1.00)
GFR calc Af Amer: >60 mL/min (ref >60)
GFR calc non Af Amer: >60 mL/min (ref >60)
Glucose, Bld: 96 mg/dL (ref 70–99)
Potassium: 3.9 mmol/L (ref 3.5–5.1)
Sodium: 140 mmol/L (ref 135–145)
Total Bilirubin: 0.4 mg/dL (ref 0.3–1.2)
Total Protein: 7.7 g/dL (ref 6.5–8.1)

## 2019-01-23 NOTE — Progress Notes (Signed)
Patient here for initial visit. Currently on antibiotic for sepsis and Pneumonia.

## 2019-01-24 LAB — CULTURE, BLOOD (ROUTINE X 2)
Culture: NO GROWTH
Culture: NO GROWTH
Special Requests: ADEQUATE
Special Requests: ADEQUATE

## 2019-01-24 LAB — CA 125: Cancer Antigen (CA) 125: 101 U/mL — ABNORMAL HIGH (ref 0.0–38.1)

## 2019-01-24 NOTE — Progress Notes (Signed)
Hematology/Oncology Follow Up Note [facility]  [phone] [phone]  Patient Care Team: [name], MD as PCP - General (Family Medicine)   Name of the patient: Destiny [name]  [id number]  1956-03-20   REASON FOR VISIT Follow up for management of stage IV high-grade serous serous carcinoma    HPI  Destiny [name] is a 63 y.o.afemale who has above oncology history reviewed by me today presented for follow up visit for management of ovarian cancer.  I first met patient on 11/05/2018, during the admission after she developed acute stroke after received chemotherapy.  Her oncology history listed as followed.  # stage IV high-grade serous serous carcinoma- [ preop CA125 1170] who underwent bilateral salpingo-oophorectomy 03/25/3531 complicated by bowel perforation status post exploratory laparotomy, small bowel resection, and reanastomosis, debridement 08/31/2018, [post op CA 125 251].  Her oncology care was at [facility] Dr.Soper.   # 11/02/2018  Patient was found to have hypotension, hypokalemia also pneumonia/meeting sepsis criteria.  Admitted  # 11/04/2018 left arm numbness and significantly weaker strength of left arm and left leg, mild dysarthria and facial droop. CT head was performed which showed no hemorrhagic changes and a CTA shows no LVO MRI of brain 11/04/2018 showed small acute infarcts right corona [employer], subcortical right parietal lobe, left cerebellum. Due to chemotherapy induced thrombocytopenia, antiplatelet was delayed.   #Echocardiogram shows no cardiac source of emboli with an EF of60-65% # 10/16/2018 CT abdomen pelvis with contrast   showed a small outpouching from the small bowel anastomotic site, small to moderate right-sided pleural effusion, some nodularity along left lateral costophrenic angle.  Right sided pleural effusion was also noted on CT chest wo contrast on 11/01/2018, with underlying  atelectasis/infiltrate.  Patient was discharged to Rehab. Patient has not been able to follow up due to readmission.   # 12/07/2018 readmitted due to acute encephalopathy, acute cystitis, HCAP.  # Readmitted again on 01/19/2019, due to CAP, Sepsis.  Discharged on 01/21/2019  Patient presents to clinic for follow up. She has hearing impairment. Per her request, I called husband and put him on speaker. Most history was obtained from Husband.  Patient is still on antibiotics to complete course for treatment of CAP.  Denies any fever, chills. Lives at home with husband.   Husband reports her mid abdomen wound has discharge sometime. No nausea or vomiting. No fever or chills. Left shoulder pain for the past few days, exacerbated with movement.  Patient denies any pain currently. Husband reports patient sometimes complains abdominal pain and takes Tylenol which did not help requests stronger pain medication.   Fatigue:  Chronic onset, perisistent, no aggravating or improving factors, no associated symptoms.  Appetite is fair.    Review of Systems  Constitutional: Positive for fatigue and unexpected weight change. Negative for appetite change, chills and fever.  HENT:   Positive for hearing loss. Negative for voice change.   Eyes: Negative for eye problems.  Respiratory: Positive for cough and shortness of breath. Negative for chest tightness.   Cardiovascular: Negative for chest pain.  Gastrointestinal: Negative for abdominal distention and blood in stool.  Endocrine: Negative for hot flashes.  Genitourinary: Negative for difficulty urinating and frequency.   Musculoskeletal: Negative for arthralgias.  Skin: Negative for itching and rash.       Some discharge from abdominal wound.   Neurological: Positive for extremity weakness.       Chronic left side weakness  Hematological: Negative for adenopathy.  Psychiatric/Behavioral: Negative for confusion.      Allergies  Allergen Reactions    Atorvastatin Hives    No s/sx of anaphylaxis, just intermittent hives.  Does not seem to be a class effect as she has tolerated lovastatin previously No s/sx of anaphylaxis, just intermittent hives.  Does not seem to be a class effect as she has tolerated lovastatin previously   Fluconazole Rash    Reaction to generic but not name brand Reaction to generic but not name brand CAN USE DIFLUCAN.DOES NOT BREAK OUT WITH DIFLUCAN.(allergic only to generic brand)   Sertraline Itching   Sulfa Antibiotics Rash and Other (See Comments)    "hot " sensation   Sulfamethoxazole-Trimethoprim Rash and Other (See Comments)    "felt hot" Other reaction(s): Other (See Comments) "felt hot"   Sulfasalazine Other (See Comments) and Rash    "hot " sensation     Past Medical History:  Diagnosis Date   CAD (coronary artery disease)    CHF (congestive heart failure) (HCC)    CVA (cerebral infarction)    Female bladder prolapse    GERD (gastroesophageal reflux disease)    Hyperlipidemia    Hypertension    Hypokalemia    Mitral valve disease    Ovarian ca ([place])    Sleep apnea    Stroke ([place])    x3     Past Surgical History:  Procedure Laterality Date   CARPAL TUNNEL RELEASE Bilateral    CATARACT EXTRACTION Right 11/22/2017   CHOLECYSTECTOMY     COLON SURGERY     TENNIS ELBOW RELEASE/NIRSCHEL PROCEDURE Left    TONSILLECTOMY      Social History   Socioeconomic History   Marital status: Married    Spouse name: Not on file   Number of children: Not on file   Years of education: Not on file   Highest education level: Not on file  Occupational History   Not on file  Social Needs   Financial resource strain: Patient refused   Food insecurity    Worry: Never true    Inability: Never true   Transportation needs    Medical: No    Non-medical: No  Tobacco Use   Smoking status: Never Smoker   Smokeless tobacco: Never Used  Substance and Sexual Activity    Alcohol use: No    Alcohol/week: 0.0 standard drinks   Drug use: No   Sexual activity: Not Currently  Lifestyle   Physical activity    Days per week: 0 days    Minutes per session: 0 min   Stress: Only a little  Relationships   Press photographer on phone: Twice a week    Gets together: Once a week    Attends religious service: 1 to 4 times per year    Active member of club or organization: No    Attends meetings of clubs or organizations: Never    Relationship status: Married   Intimate partner violence    Fear of current or ex partner: No    Emotionally abused: No    Physically abused: No    Forced sexual activity: No  Other Topics Concern   Not on file  Social History Narrative   Recently released from [place], Lives home with husband    Family History  Problem Relation Age of Onset   Heart disease Other    Hypertension Other    Rheum arthritis Mother    CAD Father  Current Outpatient Medications:    acetaminophen (TYLENOL) 325 MG tablet, Take 1-2 tablets (325-650 mg total) by mouth every 4 (four) hours as needed for mild pain., Disp: , Rfl:    amoxicillin-clavulanate (AUGMENTIN) 875-125 MG tablet, Take 1 tablet by mouth every 12 (twelve) hours., Disp: 7 tablet, Rfl: 0   aspirin 81 MG chewable tablet, Chew 81 mg by mouth daily., Disp: , Rfl:    Cholecalciferol (VITAMIN D3) 125 MCG (5000 UT) CAPS, Take 1 capsule (5,000 Units total) by mouth daily., Disp: 30 capsule, Rfl: 0   clonazePAM (KLONOPIN) 0.5 MG tablet, Take 1 tablet (0.5 mg total) by mouth at bedtime as needed for anxiety., Disp: 20 tablet, Rfl: 0   clopidogrel (PLAVIX) 75 MG tablet, Take 1 tablet (75 mg total) by mouth daily., Disp: 30 tablet, Rfl: 1   Docusate Calcium (STOOL SOFTENER PO), Take 100 mg by mouth daily. , Disp: , Rfl:    doxycycline (VIBRA-TABS) 100 MG tablet, Take 1 tablet (100 mg total) by mouth every 12 (twelve) hours., Disp: 7 tablet, Rfl: 0   famotidine  (PEPCID) 20 MG tablet, Take 1 tablet (20 mg total) by mouth 2 (two) times daily., Disp: 60 tablet, Rfl: 0   Melatonin 3 MG TABS, Take 3 mg by mouth at bedtime., Disp: , Rfl:    nitroGLYCERIN (NITROSTAT) 0.4 MG SL tablet, Place 0.4 mg under the tongue every 5 (five) minutes as needed for chest pain., Disp: , Rfl:    ondansetron (ZOFRAN) 4 MG tablet, Take 4 mg by mouth every 8 (eight) hours as needed for nausea or vomiting., Disp: , Rfl:    oxybutynin (DITROPAN) 5 MG tablet, Take 1 tablet (5 mg total) by mouth 2 (two) times daily., Disp: 60 tablet, Rfl: 0   PARoxetine (PAXIL) 30 MG tablet, Take 1 tablet (30 mg total) by mouth daily., Disp: 30 tablet, Rfl: 0   pregabalin (LYRICA) 150 MG capsule, Take 1 capsule (150 mg total) by mouth at bedtime., Disp: 30 capsule, Rfl: 0   pregabalin (LYRICA) 75 MG capsule, Take 1 capsule (75 mg total) by mouth 2 (two) times daily., Disp: 60 capsule, Rfl: 0   rosuvastatin (CRESTOR) 20 MG tablet, Take 1 tablet (20 mg total) by mouth at bedtime., Disp: 30 tablet, Rfl: 0   spironolactone (ALDACTONE) 25 MG tablet, Take 0.5 tablets (12.5 mg total) by mouth daily., Disp: 30 tablet, Rfl: 0   venlafaxine XR (EFFEXOR-XR) 75 MG 24 hr capsule, Take 75 mg by mouth daily with breakfast., Disp: , Rfl:   Physical exam:  Vitals:   01/23/19 1148  BP: 121/83  Pulse: (!) 105  Resp: 18  Temp: 99.3 F (37.4 C)  Weight: 174 lb 6.4 oz (79.1 kg)  ECOG 3 Physical Exam Constitutional:      General: She is not in acute distress.    Comments: Frail appearance, sitting in wheelchair.   HENT:     Head: Normocephalic and atraumatic.  Eyes:     General: No scleral icterus.    Pupils: Pupils are equal, round, and reactive to light.  Neck:     Musculoskeletal: Normal range of motion and neck supple.  Cardiovascular:     Rate and Rhythm: Normal rate and regular rhythm.     Heart sounds: Normal heart sounds.  Pulmonary:     Effort: Pulmonary effort is normal. No respiratory  distress.     Breath sounds: No wheezing.  Abdominal:     General: Bowel sounds are normal. There is  no distension.     Palpations: Abdomen is soft. There is no mass.     Tenderness: There is no abdominal tenderness.  Musculoskeletal: Normal range of motion.        General: No deformity.  Skin:    General: Skin is warm and dry.     Findings: No erythema or rash.     Comments: Abdomen mid-line scar, mild erythema. No discharge.   Neurological:     Mental Status: She is alert.     Cranial Nerves: No cranial nerve deficit.     Coordination: Coordination normal.     Comments: Hard hearing.  Left upper extremity 2/5,  Other extremities 3-4/5.    Psychiatric:        Behavior: Behavior normal.        Thought Content: Thought content normal.     CMP Latest Ref Rng & Units 01/23/2019  Glucose 70 - 99 mg/dL 96  BUN 8 - 23 mg/dL 18  Creatinine 0.44 - 1.00 mg/dL 0.82  Sodium 135 - 145 mmol/L 140  Potassium 3.5 - 5.1 mmol/L 3.9  Chloride 98 - 111 mmol/L 106  CO2 22 - 32 mmol/L 25  Calcium 8.9 - 10.3 mg/dL 9.6  Total Protein 6.5 - 8.1 g/dL 7.7  Total Bilirubin 0.3 - 1.2 mg/dL 0.4  Alkaline Phos 38 - 126 U/L 68  AST 15 - 41 U/L 27  ALT 0 - 44 U/L 18   CBC Latest Ref Rng & Units 01/23/2019  WBC 4.0 - 10.5 K/uL 7.9  Hemoglobin 12.0 - 15.0 g/dL 9.5(L)  Hematocrit 36.0 - 46.0 % 29.7(L)  Platelets 150 - 400 K/uL 206   RADIOGRAPHIC STUDIES: I have personally reviewed the radiological images as listed and agreed with the findings in the report.  US Abdomen Limited  Result Date: 01/19/2019 CLINICAL DATA:  Bloody purulence drainage from surgical site for 2-3 months. EXAM: ULTRASOUND ABDOMEN LIMITED COMPARISON:  None. FINDINGS: There is linear hypoechogenicity in the region of the patient's scar. The finding is at least partially due to scarring. However, a small amount of tracking fluid is not excluded given history. There is hypervascularity in this region. IMPRESSION: 1. Linear hypo  vascularity in the region of the patient's scar and drainage is identified. The findings are at least partially due to scarring. However, I do suspect there may be some fluid tracking through this region. No discrete abscess. Hypervascularity is noted. CT imaging could further evaluate for abscess if there is concern. Electronically Signed   By: Dorise Bullion III M.D   On: 01/19/2019 20:54   US Venous Img Upper Uni Left  Result Date: 01/19/2019 CLINICAL DATA:  Left upper extremity swelling. EXAM: LEFT UPPER EXTREMITY VENOUS DOPPLER ULTRASOUND TECHNIQUE: Gray-scale sonography with graded compression, as well as color Doppler and duplex ultrasound were performed to evaluate the upper extremity deep venous system from the level of the subclavian vein and including the jugular, axillary, basilic, radial, ulnar and upper cephalic vein. Spectral Doppler was utilized to evaluate flow at rest and with distal augmentation maneuvers. COMPARISON:  None. FINDINGS: Contralateral Subclavian Vein: Respiratory phasicity is normal and symmetric with the symptomatic side. No evidence of thrombus. Normal compressibility. Internal Jugular Vein: No evidence of thrombus. Normal compressibility, respiratory phasicity and response to augmentation. Subclavian Vein: No evidence of thrombus. Normal compressibility, respiratory phasicity and response to augmentation. Axillary Vein: No evidence of thrombus. Normal compressibility, respiratory phasicity and response to augmentation. Cephalic Vein: No evidence of thrombus. Normal compressibility,  respiratory phasicity and response to augmentation. Basilic Vein: No evidence of thrombus. Normal compressibility, respiratory phasicity and response to augmentation. Brachial Veins: No evidence of thrombus. Normal compressibility, respiratory phasicity and response to augmentation. Radial Veins: No evidence of thrombus. Normal compressibility, respiratory phasicity and response to augmentation.  Ulnar Veins: No evidence of thrombus. Normal compressibility, respiratory phasicity and response to augmentation. Venous Reflux:  None visualized. Other Findings:  None visualized. IMPRESSION: No evidence of DVT within the left upper extremity. Electronically Signed   By: Dorise Bullion III M.D   On: 01/19/2019 20:52   Dg Chest Port 1 View  Result Date: 01/19/2019 CLINICAL DATA:  Possible sepsis. EXAM: PORTABLE CHEST 1 VIEW COMPARISON:  Chest radiograph 12/07/2018 FINDINGS: Monitoring leads overlie the patient. Right anterior chest wall Port-A-Cath is present with tip projecting over the superior vena cava. Stable cardiac and mediastinal contours. Interval increase in consolidation within the right mid lower lung. No pleural effusion or pneumothorax. IMPRESSION: Increasing right mid and lower lung consolidation which may represent pneumonia in the appropriate clinical setting. Followup PA and lateral chest X-ray is recommended in 3-4 weeks following trial of antibiotic therapy to ensure resolution and exclude underlying malignancy. Electronically Signed   By: Lovey Newcomer M.D.   On: 01/19/2019 18:14   Dg Shoulder Left  Result Date: 01/23/2019 CLINICAL DATA:  Left shoulder pain for 2 weeks.  No injury. EXAM: LEFT SHOULDER - 2+ VIEW COMPARISON:  None. FINDINGS: No fracture.  No bone lesion. Glenohumeral joint normally spaced and aligned. No arthropathic changes. Mild AC joint osteoarthritis. Soft tissues are unremarkable. IMPRESSION: 1. No fracture or bone lesion.  No acute finding. 2. Mild AC joint osteoarthritis. Electronically Signed   By: Lajean Manes M.D.   On: 01/23/2019 19:24   Dg Hand Complete Left  Result Date: 01/19/2019 CLINICAL DATA:  Left hand edema. Suspected sepsis. No reported injury. EXAM: LEFT HAND - COMPLETE 3+ VIEW COMPARISON:  None. FINDINGS: Peripheral intravenous catheter is noted in the dorsal left wrist soft tissues. No fracture or dislocation. No suspicious focal osseous lesions. No  significant arthropathy. No osseous erosions or periosteal reaction. IMPRESSION: No acute osseous abnormality. Electronically Signed   By: Ilona Sorrel M.D.   On: 01/19/2019 18:15     Assessment and plan Patient is a 63 y.o. 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   1. Iron deficiency anemia, unspecified iron deficiency anemia type   2. Normocytic anemia   3. Left shoulder pain, unspecified chronicity    # Stage IV high-grade ovarian serous serous carcinoma Per Dr.Soper, last chemotherapy was on 10/23/2018, s/p Carboplatin AUC 5.  I had a lengthy discussion with patient and husband. They would like to transfer care at [facility] and establish care with [facility].  Recommend patient to finish current course of antibiotics, check labs, cbc cmp, CA125.  Obtain CT chest abdomen pelvis to re-evaluation of disease status.  Options includes close surveillance, dose reduced single agent Carboplatin, etc.   # Normocytic anemia, hemoglobin 9.5.  Will obtain Retic panel, vitamin B12, folate, iron panel   #left shoulder pain, obtain shoulder Xray X ray of left shoulder was independently reviewed. No fracture. Mild AC point osteoarthritis.   Refer to palliative care, and refer to gynonc.  Follow up after CT scan.  Orders Placed This Encounter  Procedures   DG Shoulder Left    Standing Status:   Future    Number of Occurrences:   1    Standing Expiration Date:   03/25/2020    Order Specific Question:   Reason for Exam (SYMPTOM  OR DIAGNOSIS REQUIRED)    Answer:   left shoulder pain    Order Specific Question:   Preferred imaging location?    Answer:   [facility]    Order Specific Question:   Radiology Contrast Protocol - do NOT  remove file path    Answer:   \[email or web address]   CT Chest W Contrast    Standing Status:   Future    Standing Expiration Date:   01/23/2020    Order Specific Question:   ** REASON FOR EXAM (FREE TEXT)    Answer:   ovarian cancer    Order Specific Question:   If indicated for the ordered procedure, I authorize the administration of contrast media per Radiology protocol    Answer:   Yes    Order Specific Question:   Preferred imaging location?    Answer:   [facility]    Order Specific Question:   Radiology Contrast Protocol - do NOT remove file path    Answer:   \[email or web address]   CT Abdomen Pelvis W Contrast    Standing Status:   Future    Standing Expiration Date:   01/23/2020    Order Specific Question:   ** REASON FOR EXAM (FREE TEXT)    Answer:   ovarian cancer    Order Specific Question:   If indicated for the ordered procedure, I authorize the administration of contrast media per Radiology protocol    Answer:   Yes    Order Specific Question:   Preferred imaging location?    Answer:   [facility]    Order Specific Question:   Is Oral Contrast requested for this exam?    Answer:   Yes, Per Radiology protocol    Order Specific Question:   Radiology Contrast Protocol - do NOT remove file path    Answer:   \[email or web address]   DG Shoulder Left    Standing Status:   Future    Number of Occurrences:   1    Standing Expiration Date:   03/25/2020    Order Specific Question:   Reason for Exam (SYMPTOM  OR DIAGNOSIS REQUIRED)    Answer:   left shoulder pain    Order Specific Question:   Preferred imaging location?    Answer:   [facility]    Order Specific Question:   Radiology Contrast Protocol - do NOT remove file path    Answer:   \[email or web address]   CBC with Differential/Platelet    Standing Status:   Future    Number of  Occurrences:   1    Standing Expiration Date:   01/23/2020   Comprehensive metabolic panel    Standing Status:   Future    Number of Occurrences:   1    Standing Expiration Date:   01/23/2020   CA 125    Standing Status:   Future    Number of Occurrences:   1    Standing Expiration Date:   01/23/2020   Ambulatory Referral to Palliative Care    Referral Priority:   Routine    Referral Type:   Consultation    Referred to Provider:   Borders, Kirt Boys, NP  Number of Visits Requested:   1   Ambulatory referral to Gynecologic Oncology    Referral Priority:   Routine    Referral Type:   Consultation    Referral Reason:   Specialty Services Required    Requested Specialty:   Gynecologic Oncology    Number of Visits Requested:   1     We spent sufficient time to discuss many aspect of care, questions were answered to patient's satisfaction. Total face to face encounter time for this patient visit was 40 min. >50% of the time was  spent in counseling and coordination of care.     Earlie Server, MD, PhD Hematology Oncology [facility] at [facility] Pager- [phone] 01/24/2019

## 2019-01-28 ENCOUNTER — Encounter: Payer: Medicaid Other | Admitting: Hospice and Palliative Medicine

## 2019-01-30 ENCOUNTER — Other Ambulatory Visit: Payer: Self-pay

## 2019-01-31 ENCOUNTER — Ambulatory Visit
Admission: RE | Admit: 2019-01-31 | Discharge: 2019-01-31 | Disposition: A | Discharge status: Home / Self Care | Payer: Medicaid Other | Source: Ambulatory Visit | Attending: Oncology | Admitting: Oncology

## 2019-01-31 ENCOUNTER — Inpatient Hospital Stay (HOSPITAL_BASED_OUTPATIENT_CLINIC_OR_DEPARTMENT_OTHER): Payer: Medicaid Other | Admitting: Hospice and Palliative Medicine

## 2019-01-31 ENCOUNTER — Inpatient Hospital Stay: Payer: Medicaid Other

## 2019-01-31 ENCOUNTER — Encounter: Payer: Medicaid Other | Admitting: Hospice and Palliative Medicine

## 2019-01-31 ENCOUNTER — Other Ambulatory Visit: Payer: Self-pay

## 2019-01-31 VITALS — BP 141/97 | HR 117 | Temp 99.1°F | Resp 2 | Wt 191.0 lb

## 2019-01-31 DIAGNOSIS — C763 Malignant neoplasm of pelvis: Secondary | ICD-10-CM

## 2019-01-31 DIAGNOSIS — Z515 Encounter for palliative care: Secondary | ICD-10-CM

## 2019-01-31 DIAGNOSIS — D649 Anemia, unspecified: Secondary | ICD-10-CM | POA: Insufficient documentation

## 2019-01-31 DIAGNOSIS — M25512 Pain in left shoulder: Secondary | ICD-10-CM

## 2019-01-31 DIAGNOSIS — C569 Malignant neoplasm of unspecified ovary: Secondary | ICD-10-CM | POA: Diagnosis not present

## 2019-01-31 DIAGNOSIS — D509 Iron deficiency anemia, unspecified: Secondary | ICD-10-CM

## 2019-01-31 LAB — VITAMIN B12: Vitamin B-12: 248 pg/mL (ref 180–914)

## 2019-01-31 LAB — RETIC PANEL
Immature Retic Fract: 19.4 % — ABNORMAL HIGH (ref 2.3–15.9)
RBC.: 3.14 MIL/uL — ABNORMAL LOW (ref 3.87–5.11)
Retic Count, Absolute: 99.5 10*3/uL (ref 19.0–186.0)
Retic Ct Pct: 3.2 % — ABNORMAL HIGH (ref 0.4–3.1)
Reticulocyte Hemoglobin: 33.8 pg (ref >27.9)

## 2019-01-31 LAB — IRON AND TIBC
Iron: 66 ug/dL (ref 28–170)
Saturation Ratios: 23 % (ref 10.4–31.8)
TIBC: 293 ug/dL (ref 250–450)
UIBC: 227 ug/dL

## 2019-01-31 LAB — FERRITIN: Ferritin: 362 ng/mL — ABNORMAL HIGH (ref 11–307)

## 2019-01-31 LAB — FOLATE: Folate: 9.7 ng/mL (ref >5.9)

## 2019-01-31 MED ORDER — OXYCODONE HCL 5 MG PO TABS: 5.0000 mg | ORAL_TABLET | Freq: Four times a day (QID) | ORAL | 0 refills | Status: DC | PRN

## 2019-01-31 MED ORDER — IOHEXOL 300 MG/ML  SOLN
100.0000 mL | Freq: Once | INTRAMUSCULAR | Status: AC | PRN
  Administered 2019-01-31: 100 mL via INTRAVENOUS

## 2019-01-31 NOTE — Progress Notes (Signed)
[facility]  [phone] [phone]   Name: Destiny [name] Date: 01/31/2019 MRN: [id number]  DOB: Nov 16, 1955  Patient Care Team: [name], MD as PCP - General (Family Medicine)    REASON FOR CONSULTATION: Palliative Care consult requested for this 63 y.o. 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  Patient was hospitalized 10/2018 with acute CVA.  She was hospitalized again 11/2018 with pneumonia and readmitted 01/19/2019 to 01/21/2019 for same.  Patient was last treated with chemotherapy 10/23/2018.  Patient was referred to palliative care to help address goals and manage ongoing symptoms.  SOCIAL HISTORY:     reports that she has never smoked. She has never used smokeless tobacco. She reports that she does not drink alcohol or use drugs.   Patient is married and lives at home with her husband.  She has a son and daughter, both of whom are involved in her care.  She previously worked as a [employer].  ADVANCE DIRECTIVES:  Does not have  CODE STATUS: Full code  PAST MEDICAL HISTORY: Past Medical History:  Diagnosis Date   CAD (coronary artery disease)    CHF (congestive heart failure) (HCC)    CVA (cerebral infarction)    Female bladder prolapse    GERD (gastroesophageal reflux disease)    Hyperlipidemia    Hypertension    Hypokalemia    Mitral valve disease    Ovarian ca ([place])    Sleep apnea    Stroke ([place])    x3    PAST SURGICAL HISTORY:  Past Surgical History:  Procedure Laterality Date   CARPAL TUNNEL RELEASE Bilateral    CATARACT EXTRACTION Right 11/22/2017   CHOLECYSTECTOMY     COLON SURGERY     TENNIS ELBOW RELEASE/NIRSCHEL PROCEDURE Left    TONSILLECTOMY      HEMATOLOGY/ONCOLOGY HISTORY:  Oncology History   No history exists.     ALLERGIES:  is allergic to atorvastatin; fluconazole; sertraline; sulfa antibiotics; sulfamethoxazole-trimethoprim; and sulfasalazine.  MEDICATIONS:  Current Outpatient Medications  Medication Sig Dispense Refill   acetaminophen (TYLENOL) 325 MG tablet Take 1-2 tablets (325-650 mg total) by mouth every 4 (four) hours as needed for mild pain.     amoxicillin-clavulanate (AUGMENTIN) 875-125 MG tablet Take 1 tablet by mouth every 12 (twelve) hours. 7 tablet 0   aspirin 81 MG chewable tablet Chew 81 mg by mouth daily.     Cholecalciferol (VITAMIN D3) 125 MCG (5000 UT) CAPS Take 1 capsule (5,000 Units total) by mouth daily. 30 capsule 0   clonazePAM (KLONOPIN) 0.5 MG tablet Take 1 tablet (0.5 mg total) by mouth at bedtime as needed for anxiety. 20 tablet 0   clopidogrel (PLAVIX) 75 MG tablet Take 1 tablet (75 mg total) by mouth daily. 30 tablet 1   Docusate Calcium (STOOL SOFTENER PO) Take 100 mg by mouth daily.      doxycycline (VIBRA-TABS) 100 MG tablet Take 1 tablet (100 mg total) by mouth every 12 (twelve) hours. 7 tablet 0   famotidine (PEPCID) 20 MG tablet Take 1 tablet (20 mg total) by mouth 2 (two) times daily. 60 tablet 0   Melatonin 3 MG TABS Take 3 mg by mouth at bedtime.     nitroGLYCERIN (NITROSTAT) 0.4 MG SL tablet Place 0.4 mg under the tongue every 5 (five) minutes as needed for chest pain.  ondansetron (ZOFRAN) 4 MG tablet Take 4 mg by mouth every 8 (eight) hours as needed for nausea or vomiting.     oxybutynin (DITROPAN) 5 MG tablet Take 1 tablet (5 mg total) by mouth 2 (two) times daily. 60 tablet 0   PARoxetine (PAXIL) 30 MG tablet Take 1 tablet (30 mg total) by mouth daily. 30 tablet 0   pregabalin (LYRICA) 150 MG capsule Take 1 capsule (150 mg total) by mouth at bedtime. 30 capsule 0   pregabalin (LYRICA) 75 MG capsule Take 1 capsule (75 mg total) by mouth 2 (two) times daily. 60 capsule 0   rosuvastatin (CRESTOR) 20 MG tablet Take 1 tablet (20 mg  total) by mouth at bedtime. 30 tablet 0   spironolactone (ALDACTONE) 25 MG tablet Take 0.5 tablets (12.5 mg total) by mouth daily. 30 tablet 0   venlafaxine XR (EFFEXOR-XR) 75 MG 24 hr capsule Take 75 mg by mouth daily with breakfast.     No current facility-administered medications for this visit.     VITAL SIGNS: There were no vitals taken for this visit. There were no vitals filed for this visit.  Estimated body mass index is 31.9 kg/m as calculated from the following:   Height as of 01/19/19: 5' 2"  (1.575 m).   Weight as of 01/23/19: 174 lb 6.4 oz (79.1 kg).  LABS: CBC:    Component Value Date/Time   WBC 7.9 01/23/2019 1255   HGB 9.5 (L) 01/23/2019 1255   HGB 12.6 06/07/2014 0750   HCT 29.7 (L) 01/23/2019 1255   HCT 38.0 06/07/2014 0750   PLT 206 01/23/2019 1255   PLT 215 06/07/2014 0750   MCV 98.0 01/23/2019 1255   MCV 93 06/07/2014 0750   NEUTROABS 5.5 01/23/2019 1255   NEUTROABS 4.9 06/07/2014 0750   LYMPHSABS 1.4 01/23/2019 1255   LYMPHSABS 1.8 06/07/2014 0750   MONOABS 0.8 01/23/2019 1255   MONOABS 0.7 06/07/2014 0750   EOSABS 0.2 01/23/2019 1255   EOSABS 0.1 06/07/2014 0750   BASOSABS 0.0 01/23/2019 1255   BASOSABS 0.0 06/07/2014 0750   Comprehensive Metabolic Panel:    Component Value Date/Time   NA 140 01/23/2019 1255   NA 137 06/07/2014 0750   K 3.9 01/23/2019 1255   K 3.4 (L) 06/07/2014 0750   CL 106 01/23/2019 1255   CL 100 06/07/2014 0750   CO2 25 01/23/2019 1255   CO2 31 06/07/2014 0750   BUN 18 01/23/2019 1255   BUN 19 (H) 06/07/2014 0750   CREATININE 0.82 01/23/2019 1255   CREATININE 0.92 06/07/2014 0750   GLUCOSE 96 01/23/2019 1255   GLUCOSE 109 (H) 06/07/2014 0750   CALCIUM 9.6 01/23/2019 1255   CALCIUM 9.0 06/07/2014 0750   AST 27 01/23/2019 1255   AST 15 06/07/2014 0750   ALT 18 01/23/2019 1255   ALT 28 06/07/2014 0750   ALKPHOS 68 01/23/2019 1255   ALKPHOS 137 (H) 06/07/2014 0750   BILITOT 0.4 01/23/2019 1255   BILITOT 0.5  06/07/2014 0750   PROT 7.7 01/23/2019 1255   PROT 7.5 06/07/2014 0750   ALBUMIN 3.7 01/23/2019 1255   ALBUMIN 3.7 06/07/2014 0750    RADIOGRAPHIC STUDIES: US Abdomen Limited  Result Date: 01/19/2019 CLINICAL DATA:  Bloody purulence drainage from surgical site for 2-3 months. EXAM: ULTRASOUND ABDOMEN LIMITED COMPARISON:  None. FINDINGS: There is linear hypoechogenicity in the region of the patient's scar. The finding is at least partially due to scarring. However, a small amount of tracking fluid is  not excluded given history. There is hypervascularity in this region. IMPRESSION: 1. Linear hypo vascularity in the region of the patient's scar and drainage is identified. The findings are at least partially due to scarring. However, I do suspect there may be some fluid tracking through this region. No discrete abscess. Hypervascularity is noted. CT imaging could further evaluate for abscess if there is concern. Electronically Signed   By: Dorise Bullion III M.D   On: 01/19/2019 20:54   US Venous Img Upper Uni Left  Result Date: 01/19/2019 CLINICAL DATA:  Left upper extremity swelling. EXAM: LEFT UPPER EXTREMITY VENOUS DOPPLER ULTRASOUND TECHNIQUE: Gray-scale sonography with graded compression, as well as color Doppler and duplex ultrasound were performed to evaluate the upper extremity deep venous system from the level of the subclavian vein and including the jugular, axillary, basilic, radial, ulnar and upper cephalic vein. Spectral Doppler was utilized to evaluate flow at rest and with distal augmentation maneuvers. COMPARISON:  None. FINDINGS: Contralateral Subclavian Vein: Respiratory phasicity is normal and symmetric with the symptomatic side. No evidence of thrombus. Normal compressibility. Internal Jugular Vein: No evidence of thrombus. Normal compressibility, respiratory phasicity and response to augmentation. Subclavian Vein: No evidence of thrombus. Normal compressibility, respiratory phasicity  and response to augmentation. Axillary Vein: No evidence of thrombus. Normal compressibility, respiratory phasicity and response to augmentation. Cephalic Vein: No evidence of thrombus. Normal compressibility, respiratory phasicity and response to augmentation. Basilic Vein: No evidence of thrombus. Normal compressibility, respiratory phasicity and response to augmentation. Brachial Veins: No evidence of thrombus. Normal compressibility, respiratory phasicity and response to augmentation. Radial Veins: No evidence of thrombus. Normal compressibility, respiratory phasicity and response to augmentation. Ulnar Veins: No evidence of thrombus. Normal compressibility, respiratory phasicity and response to augmentation. Venous Reflux:  None visualized. Other Findings:  None visualized. IMPRESSION: No evidence of DVT within the left upper extremity. Electronically Signed   By: Dorise Bullion III M.D   On: 01/19/2019 20:52   Dg Chest Port 1 View  Result Date: 01/19/2019 CLINICAL DATA:  Possible sepsis. EXAM: PORTABLE CHEST 1 VIEW COMPARISON:  Chest radiograph 12/07/2018 FINDINGS: Monitoring leads overlie the patient. Right anterior chest wall Port-A-Cath is present with tip projecting over the superior vena cava. Stable cardiac and mediastinal contours. Interval increase in consolidation within the right mid lower lung. No pleural effusion or pneumothorax. IMPRESSION: Increasing right mid and lower lung consolidation which may represent pneumonia in the appropriate clinical setting. Followup PA and lateral chest X-ray is recommended in 3-4 weeks following trial of antibiotic therapy to ensure resolution and exclude underlying malignancy. Electronically Signed   By: Lovey Newcomer M.D.   On: 01/19/2019 18:14   Dg Shoulder Left  Result Date: 01/23/2019 CLINICAL DATA:  Left shoulder pain for 2 weeks.  No injury. EXAM: LEFT SHOULDER - 2+ VIEW COMPARISON:  None. FINDINGS: No fracture.  No bone lesion. Glenohumeral joint  normally spaced and aligned. No arthropathic changes. Mild AC joint osteoarthritis. Soft tissues are unremarkable. IMPRESSION: 1. No fracture or bone lesion.  No acute finding. 2. Mild AC joint osteoarthritis. Electronically Signed   By: Lajean Manes M.D.   On: 01/23/2019 19:24   Dg Hand Complete Left  Result Date: 01/19/2019 CLINICAL DATA:  Left hand edema. Suspected sepsis. No reported injury. EXAM: LEFT HAND - COMPLETE 3+ VIEW COMPARISON:  None. FINDINGS: Peripheral intravenous catheter is noted in the dorsal left wrist soft tissues. No fracture or dislocation. No suspicious focal osseous lesions. No significant arthropathy. No osseous erosions  or periosteal reaction. IMPRESSION: No acute osseous abnormality. Electronically Signed   By: Ilona Sorrel M.D.   On: 01/19/2019 18:15    PERFORMANCE STATUS (ECOG) : 3 - Symptomatic, >50% confined to bed  Review of Systems Unless otherwise noted, a complete review of systems is negative.  Physical Exam General: NAD, frail appearing, in wheelchair Pulmonary: Unlabored Extremities: no edema, no joint deformities Skin: no rashes Neurological: Weakness but otherwise nonfocal  IMPRESSION: I met with patient today in the clinic.  Introduced palliative care services and to him to establish therapeutic rapport.  Patient reports general decline in functioning over the past 2 months.  She is ambulatory at home with use of a walker but requires husband to hold on to her and help her stand.  Patient is receiving home health services.  We discussed her multiple medical problems and how they might impact her health and quality of life going forward.  Patient still seems interested in treatment if Dr. Tasia Catchings feels it appropriate.  We discussed CODE STATUS and I sent her home with a MOST Form to review.  Patient verbalized a desire to remain a full code.  She verbalized an understanding that resuscitation would likely prove futile but still wanted an  attempt.  Patient reports having severe and persistent pain in the left shoulder and arm.  She has been taking Norco around-the-clock but it has not controlled her pain.  Husband says that he has given patient some of his oxycodone and it did a better job controlling pain.  Will stop Norco and try oxycodone.  PLAN: -Continue current scope of treatment -DC Norco -Start oxycodone 5-52m PO Q6H PRN for pain -Prophylactic bowel regimen -Home-based PC referral -RTC in 2 weeks   Patient expressed understanding and was in agreement with this plan. She also understands that She can call the clinic at any time with any questions, concerns, or complaints.     Time Total: 30 minutes  Visit consisted of counseling and education dealing with the complex and emotionally intense issues of symptom management and palliative care in the setting of serious and potentially life-threatening illness.Greater than 50%  of this time was spent counseling and coordinating care related to the above assessment and plan.  Signed by: JAltha Harm PhD, NP-C [phone](Work Cell)

## 2019-02-01 ENCOUNTER — Emergency Department: Payer: Medicaid Other

## 2019-02-01 ENCOUNTER — Other Ambulatory Visit: Payer: Self-pay

## 2019-02-01 ENCOUNTER — Telehealth: Payer: Self-pay | Admitting: Oncology

## 2019-02-01 ENCOUNTER — Emergency Department
Admission: EM | Admit: 2019-02-01 | Discharge: 2019-02-02 | Disposition: A | Discharge status: Home / Self Care | Payer: Medicaid Other | Attending: Emergency Medicine | Admitting: Emergency Medicine

## 2019-02-01 DIAGNOSIS — Z8673 Personal history of transient ischemic attack (TIA), and cerebral infarction without residual deficits: Secondary | ICD-10-CM | POA: Insufficient documentation

## 2019-02-01 DIAGNOSIS — Z7901 Long term (current) use of anticoagulants: Secondary | ICD-10-CM | POA: Insufficient documentation

## 2019-02-01 DIAGNOSIS — I2510 Atherosclerotic heart disease of native coronary artery without angina pectoris: Secondary | ICD-10-CM | POA: Diagnosis not present

## 2019-02-01 DIAGNOSIS — Z7982 Long term (current) use of aspirin: Secondary | ICD-10-CM | POA: Insufficient documentation

## 2019-02-01 DIAGNOSIS — I110 Hypertensive heart disease with heart failure: Secondary | ICD-10-CM | POA: Diagnosis not present

## 2019-02-01 DIAGNOSIS — I5032 Chronic diastolic (congestive) heart failure: Secondary | ICD-10-CM | POA: Insufficient documentation

## 2019-02-01 DIAGNOSIS — R531 Weakness: Secondary | ICD-10-CM | POA: Insufficient documentation

## 2019-02-01 DIAGNOSIS — Z79899 Other long term (current) drug therapy: Secondary | ICD-10-CM | POA: Insufficient documentation

## 2019-02-01 DIAGNOSIS — N3001 Acute cystitis with hematuria: Secondary | ICD-10-CM | POA: Diagnosis not present

## 2019-02-01 DIAGNOSIS — C569 Malignant neoplasm of unspecified ovary: Secondary | ICD-10-CM | POA: Diagnosis not present

## 2019-02-01 DIAGNOSIS — Z20828 Contact with and (suspected) exposure to other viral communicable diseases: Secondary | ICD-10-CM | POA: Insufficient documentation

## 2019-02-01 LAB — CBC WITH DIFFERENTIAL/PLATELET
Abs Immature Granulocytes: 0.06 10*3/uL (ref 0.00–0.07)
Basophils Absolute: 0 10*3/uL (ref 0.0–0.1)
Basophils Relative: 1 %
Eosinophils Absolute: 0.3 10*3/uL (ref 0.0–0.5)
Eosinophils Relative: 4 %
HCT: 31.2 % — ABNORMAL LOW (ref 36.0–46.0)
Hemoglobin: 9.9 g/dL — ABNORMAL LOW (ref 12.0–15.0)
Immature Granulocytes: 1 %
Lymphocytes Relative: 21 %
Lymphs Abs: 1.7 10*3/uL (ref 0.7–4.0)
MCH: 31.4 pg (ref 26.0–34.0)
MCHC: 31.7 g/dL (ref 30.0–36.0)
MCV: 99 fL (ref 80.0–100.0)
Monocytes Absolute: 1.1 10*3/uL — ABNORMAL HIGH (ref 0.1–1.0)
Monocytes Relative: 14 %
Neutro Abs: 4.8 10*3/uL (ref 1.7–7.7)
Neutrophils Relative %: 59 %
Platelets: 219 10*3/uL (ref 150–400)
RBC: 3.15 MIL/uL — ABNORMAL LOW (ref 3.87–5.11)
RDW: 13.6 % (ref 11.5–15.5)
WBC: 8 10*3/uL (ref 4.0–10.5)
nRBC: 0 % (ref 0.0–0.2)

## 2019-02-01 LAB — COMPREHENSIVE METABOLIC PANEL
ALT: 17 U/L (ref 0–44)
AST: 25 U/L (ref 15–41)
Albumin: 3.8 g/dL (ref 3.5–5.0)
Alkaline Phosphatase: 69 U/L (ref 38–126)
Anion gap: 10 (ref 5–15)
BUN: 14 mg/dL (ref 8–23)
CO2: 25 mmol/L (ref 22–32)
Calcium: 9.2 mg/dL (ref 8.9–10.3)
Chloride: 104 mmol/L (ref 98–111)
Creatinine, Ser: 0.92 mg/dL (ref 0.44–1.00)
GFR calc Af Amer: >60 mL/min (ref >60)
GFR calc non Af Amer: >60 mL/min (ref >60)
Glucose, Bld: 115 mg/dL — ABNORMAL HIGH (ref 70–99)
Potassium: 3.4 mmol/L — ABNORMAL LOW (ref 3.5–5.1)
Sodium: 139 mmol/L (ref 135–145)
Total Bilirubin: 0.5 mg/dL (ref 0.3–1.2)
Total Protein: 7.9 g/dL (ref 6.5–8.1)

## 2019-02-01 LAB — LACTIC ACID, PLASMA: Lactic Acid, Venous: 1.5 mmol/L (ref 0.5–1.9)

## 2019-02-01 MED ORDER — SODIUM CHLORIDE 0.9 % IV BOLUS
1000.0000 mL | Freq: Once | INTRAVENOUS | Status: AC
  Administered 2019-02-02: 1000 mL via INTRAVENOUS

## 2019-02-01 NOTE — ED Provider Notes (Signed)
[facility] Emergency Department Provider Note  ____________________________________________  Time seen: Approximately 11:43 PM  I have reviewed the triage vital signs and the nursing notes.   HISTORY  Chief Complaint Weakness   HPI Destiny [name] is a 63 y.o. female with a history of a stroke with a left-sided weakness, CAD, CHF, hypertension, hyperlipidemia, ovarian cancer status post chemotherapy who presents for evaluation of generalized weakness and possible subjective fever.  Patient was sent here via EMS by her family for concerns of fever and generalized weakness for the last several days.  Patient has seen her doctors recently for post admission visits and family was concerned for possible COVID.  Patient reports feeling very tired but denies any known fever, cough, chest pain, shortness of breath, headache, abdominal pain, nausea, vomiting, diarrhea, dysuria.  Patient finished the course of antibiotics for her pneumonia and fully recovered from that.  No known exposure to COVID.  She denies any worsening unilateral weakness which she has had since her stroke.   Past Medical History:  Diagnosis Date  . CAD (coronary artery disease)   . CHF (congestive heart failure) ([place])   . CVA (cerebral infarction)   . Female bladder prolapse   . GERD (gastroesophageal reflux disease)   . Hyperlipidemia   . Hypertension   . Hypokalemia   . Mitral valve disease   . Ovarian ca ([place])   . Sleep apnea   . Stroke [facility])    x3    Patient Active Problem List   Diagnosis Date Noted  . Pneumonia 01/19/2019  . HAP (hospital-acquired pneumonia) 12/07/2018  . Acute blood loss anemia   . Chronic systolic congestive heart failure ([place])   . Embolic stroke ([place]) 09/38/1829  . 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 Thrombocytopenia ([place])   . Cerebrovascular accident (CVA) ([place])   . HLD (hyperlipidemia) 11/02/2018  . GERD (gastroesophageal reflux disease)  11/02/2018  . CAD (coronary artery disease) 11/02/2018  . Sleep apnea 11/02/2018  . Hypotension 11/02/2018  . Somnolence 11/02/2018  . Chronic diastolic heart failure ([place]) 11/15/2017  . HTN (hypertension) 11/15/2017  . Hypokalemia 11/05/2017  . Chest pain 02/14/2016  . Small bowel obstruction ([place]) 06/16/2015    Past Surgical History:  Procedure Laterality Date  . CARPAL TUNNEL RELEASE Bilateral   . CATARACT EXTRACTION Right 11/22/2017  . CHOLECYSTECTOMY    . COLON SURGERY    . TENNIS ELBOW RELEASE/NIRSCHEL PROCEDURE Left   . TONSILLECTOMY      Prior to Admission medications   Medication Sig Start Date End Date Taking? Authorizing Provider  acetaminophen (TYLENOL) 325 MG tablet Take 1-2 tablets (325-650 mg total) by mouth every 4 (four) hours as needed for mild pain. 11/28/18   Angiulli, Lavon Paganini, PA-C  amoxicillin-clavulanate (AUGMENTIN) 875-125 MG tablet Take 1 tablet by mouth every 12 (twelve) hours. 01/21/19   Loletha Grayer, MD  aspirin 81 MG chewable tablet Chew 81 mg by mouth daily.    [provider]  cephALEXin (KEFLEX) 500 MG capsule Take 1 capsule (500 mg total) by mouth 3 (three) times daily for 7 days. 02/02/19 02/09/19  Rudene Re, MD  Cholecalciferol (VITAMIN D3) 125 MCG (5000 UT) CAPS Take 1 capsule (5,000 Units total) by mouth daily. 11/28/18   Angiulli, Lavon Paganini, PA-C  clonazePAM (KLONOPIN) 0.5 MG tablet Take 1 tablet (0.5 mg total) by mouth at bedtime as needed for anxiety. 11/28/18   Angiulli, Lavon Paganini, PA-C  clopidogrel (PLAVIX) 75 MG  tablet Take 1 tablet (75 mg total) by mouth daily. 11/28/18   Angiulli, Lavon Paganini, PA-C  Docusate Calcium (STOOL SOFTENER PO) Take 100 mg by mouth daily.     [provider]  doxycycline (VIBRA-TABS) 100 MG tablet Take 1 tablet (100 mg total) by mouth every 12 (twelve) hours. 01/21/19   Loletha Grayer, MD  famotidine (PEPCID) 20 MG tablet Take 1 tablet (20 mg total) by mouth 2 (two) times daily. 11/28/18    Angiulli, Lavon Paganini, PA-C  Melatonin 3 MG TABS Take 3 mg by mouth at bedtime.    [provider]  nitroGLYCERIN (NITROSTAT) 0.4 MG SL tablet Place 0.4 mg under the tongue every 5 (five) minutes as needed for chest pain.    [provider]  ondansetron (ZOFRAN) 4 MG tablet Take 4 mg by mouth every 8 (eight) hours as needed for nausea or vomiting.    [provider]  oxybutynin (DITROPAN) 5 MG tablet Take 1 tablet (5 mg total) by mouth 2 (two) times daily. 11/28/18   Angiulli, Lavon Paganini, PA-C  oxyCODONE (OXY IR/ROXICODONE) 5 MG immediate release tablet Take 1-2 tablets (5-10 mg total) by mouth every 6 (six) hours as needed for severe pain. 01/31/19   Borders, Kirt Boys, NP  PARoxetine (PAXIL) 30 MG tablet Take 1 tablet (30 mg total) by mouth daily. 11/28/18   Angiulli, Lavon Paganini, PA-C  pregabalin (LYRICA) 150 MG capsule Take 1 capsule (150 mg total) by mouth at bedtime. 11/28/18   Angiulli, Lavon Paganini, PA-C  pregabalin (LYRICA) 75 MG capsule Take 1 capsule (75 mg total) by mouth 2 (two) times daily. 12/13/18   Mayo, Pete Pelt, MD  rosuvastatin (CRESTOR) 20 MG tablet Take 1 tablet (20 mg total) by mouth at bedtime. 11/28/18   Angiulli, Lavon Paganini, PA-C  spironolactone (ALDACTONE) 25 MG tablet Take 0.5 tablets (12.5 mg total) by mouth daily. 11/28/18 11/28/19  Angiulli, Lavon Paganini, PA-C  venlafaxine XR (EFFEXOR-XR) 75 MG 24 hr capsule Take 75 mg by mouth daily with breakfast.    [provider]    Allergies Atorvastatin, Fluconazole, Sertraline, Sulfa antibiotics, Sulfamethoxazole-trimethoprim, and Sulfasalazine  Family History  Problem Relation Age of Onset  . Heart disease Other   . Hypertension Other   . Rheum arthritis Mother   . CAD Father     Social History Social History   Tobacco Use  . Smoking status: Never Smoker  . Smokeless tobacco: Never Used  Substance Use Topics  . Alcohol use: No    Alcohol/week: 0.0 standard drinks  . Drug use: No    Review of  Systems  Constitutional: + fever and generalized weakness, fatigue Eyes: Negative for visual changes. ENT: Negative for sore throat. Neck: No neck pain  Cardiovascular: Negative for chest pain. Respiratory: Negative for shortness of breath. Gastrointestinal: Negative for abdominal pain, vomiting or diarrhea. Genitourinary: Negative for dysuria. Musculoskeletal: Negative for back pain. Skin: Negative for rash. Neurological: Negative for headaches, weakness or numbness. Psych: No SI or HI  ____________________________________________   PHYSICAL EXAM:  VITAL SIGNS: ED Triage Vitals  Enc Vitals Group     BP --      Pulse Rate 02/01/19 2251 (!) 102     Resp 02/01/19 2251 20     Temp 02/01/19 2209 98.6 F (37 C)     Temp Source 02/01/19 2209 Oral     SpO2 02/01/19 2251 95 %     Weight 02/01/19 2210 190 lb 14.7 oz (86.6 kg)  Height 02/01/19 2210 5\' 2"  (1.575 m)     Head Circumference --      Peak Flow --      Pain Score 02/01/19 2210 0     Pain Loc --      Pain Edu? --      Excl. in [place]? --     Constitutional: Alert and oriented. Well appearing and in no apparent distress. HEENT:      Head: Normocephalic and atraumatic.         Eyes: Conjunctivae are normal. Sclera is non-icteric.       Mouth/Throat: Mucous membranes are moist.       Neck: Supple with no signs of meningismus. Cardiovascular: Tachycardic with regular rhythm. No murmurs, gallops, or rubs. 2+ symmetrical distal pulses are present in all extremities. No JVD. Respiratory: Normal respiratory effort. Lungs are clear to auscultation bilaterally. No wheezes, crackles, or rhonchi.  Gastrointestinal: Soft, non tender, and non distended with positive bowel sounds. No rebound or guarding. Musculoskeletal: Nontender with normal range of motion in all extremities. No edema, cyanosis, or erythema of extremities. Neurologic: Normal speech and language. Face is symmetric. Mild L sided weakness Skin: Skin is warm, dry and  intact. No rash noted. Psychiatric: Mood and affect are normal. Speech and behavior are normal.  ____________________________________________   LABS (all labs ordered are listed, but only abnormal results are displayed)  Labs Reviewed  CBC WITH DIFFERENTIAL/PLATELET - Abnormal; Notable for the following components:      Result Value   RBC 3.15 (*)    Hemoglobin 9.9 (*)    HCT 31.2 (*)    Monocytes Absolute 1.1 (*)    All other components within normal limits  COMPREHENSIVE METABOLIC PANEL - Abnormal; Notable for the following components:   Potassium 3.4 (*)    Glucose, Bld 115 (*)    All other components within normal limits  URINALYSIS, COMPLETE (UACMP) WITH MICROSCOPIC - Abnormal; Notable for the following components:   Color, Urine YELLOW (*)    APPearance HAZY (*)    Hgb urine dipstick SMALL (*)    Leukocytes,Ua MODERATE (*)    WBC, UA >50 (*)    Bacteria, UA RARE (*)    All other components within normal limits  SARS CORONAVIRUS 2 (HOSPITAL ORDER, [facility])  URINE CULTURE  LACTIC ACID, PLASMA   ____________________________________________  EKG  ED ECG REPORT I, Rudene Re, the attending physician, personally viewed and interpreted this ECG.  Sinus tachycardia, rate of 103, normal intervals, normal axis, no ST elevations or depressions.  Unchanged from prior. ____________________________________________  RADIOLOGY  I have personally reviewed the images performed during this visit and I agree with the Radiologist's read.   Interpretation by Radiologist:  Dg Chest Portable 1 View  Result Date: 02/01/2019 CLINICAL DATA:  63 year old female with fever. EXAM: PORTABLE CHEST 1 VIEW COMPARISON:  Chest CT dated 01/31/2019 FINDINGS: Right pectoral Port-A-Cath with tip at the cavoatrial junction. Trace right pleural effusion. No focal consolidation or pneumothorax. The cardiac silhouette is within normal limits. No acute osseous  pathology. IMPRESSION: Trace right pleural effusion. No focal consolidation. Electronically Signed   By: Anner Crete M.D.   On: 02/01/2019 22:48     ____________________________________________   PROCEDURES  Procedure(s) performed: None Procedures Critical Care performed:  None ____________________________________________   INITIAL IMPRESSION / ASSESSMENT AND PLAN / ED COURSE  63 y.o. female with a history of a stroke with a left-sided weakness, CAD, CHF, hypertension,  hyperlipidemia, ovarian cancer status post chemotherapy who presents for evaluation of generalized weakness and possible subjective fever.  No localizing signs or symptoms on physical and history.  Patient does have mild left-sided weakness which is residual from prior stroke.  Patient feels at baseline other than feeling fatigued.  She is afebrile here.  Slightly tachycardic with normal work of breathing, clear lungs, soft nontender abdomen.  Labs showing stable anemia with hemoglobin of 9.9.  Normal white count, normal lactic acid, normal electrolytes, normal kidney function.  Chest x-ray shows resolution of pneumonia.  COVID negative. UA positive for UTI. No signs of sepsis. Culture sent. Patient given rocephin and dc home on keflex. Discussed my standard return precautions and f/u with PCP.        As part of my medical decision making, I reviewed the following data within the [facility] notes reviewed and incorporated, Labs reviewed , EKG interpreted , Old EKG reviewed, Old chart reviewed, Radiograph reviewed , Notes from prior ED visits and [place] Controlled Substance Database    Pertinent labs & imaging results that were available during my care of the patient were reviewed by me and considered in my medical decision making (see chart for details).    ____________________________________________   FINAL CLINICAL IMPRESSION(S) / ED DIAGNOSES  Final diagnoses:  Generalized weakness   Acute cystitis with hematuria      NEW MEDICATIONS STARTED DURING THIS VISIT:  ED Discharge Orders         Ordered    cephALEXin (KEFLEX) 500 MG capsule  3 times daily     02/02/19 0035           Note:  This document was prepared using Dragon voice recognition software and may include unintentional dictation errors.    Alfred Levins, [place], MD 02/02/19 0100

## 2019-02-01 NOTE — Telephone Encounter (Signed)
CT scan was reviewed and discussed with patient's husband over the phone. Patient see me next week we will discuss further details.

## 2019-02-01 NOTE — ED Triage Notes (Addendum)
Pt comes via [facility] from home with c/o possible fever and some weakness. Pt recently taken for some doctor appts and family was concerned.  VSS, no fever reported by EMS, no pain. Pt is alert and oriented X4.  Pt states she is just tired. Pt denies any chest pain, SOB or fever or cough.

## 2019-02-02 LAB — URINALYSIS, COMPLETE (UACMP) WITH MICROSCOPIC
Bilirubin Urine: NEGATIVE
Glucose, UA: NEGATIVE mg/dL
Ketones, ur: NEGATIVE mg/dL
Nitrite: NEGATIVE
Protein, ur: NEGATIVE mg/dL
Specific Gravity, Urine: 1.027 (ref 1.005–1.030)
WBC, UA: >50 WBC/hpf — ABNORMAL HIGH (ref 0–5)
pH: 5 (ref 5.0–8.0)

## 2019-02-02 LAB — SARS CORONAVIRUS 2 BY RT PCR (HOSPITAL ORDER, PERFORMED IN ~~LOC~~ HOSPITAL LAB): SARS Coronavirus 2: NEGATIVE

## 2019-02-02 MED ORDER — CEPHALEXIN 500 MG PO CAPS: 500.0000 mg | ORAL_CAPSULE | Freq: Three times a day (TID) | ORAL | 0 refills | Status: AC

## 2019-02-02 MED ORDER — FLUCONAZOLE 150 MG PO TABS: 150.0000 mg | ORAL_TABLET | Freq: Every day | ORAL | 0 refills | Status: DC

## 2019-02-02 MED ORDER — SODIUM CHLORIDE 0.9 % IV SOLN
1.0000 g | Freq: Once | INTRAVENOUS | Status: AC
  Administered 2019-02-02: 1 g via INTRAVENOUS
  Filled 2019-02-02: qty 10

## 2019-02-02 NOTE — ED Notes (Signed)
In and out cath performed by this RN, witnessed by [facility] NT.

## 2019-02-02 NOTE — ED Notes (Signed)
This called patient's son and spouse to arrange for transport home. No answer at either number.   Patient somnolent, wakes up when spoken too, however, quickly falls back asleep.

## 2019-02-03 LAB — URINE CULTURE: Culture: NO GROWTH

## 2019-02-04 ENCOUNTER — Telehealth: Payer: Self-pay | Admitting: Primary Care

## 2019-02-04 ENCOUNTER — Ambulatory Visit: Payer: Medicaid Other | Admitting: Oncology

## 2019-02-04 NOTE — Telephone Encounter (Signed)
Spoke with patient's husband Kasandra Knudsen to schedule Palliative consult, he requested that I call him back after lunch because he was in the middle of something at the moment.  He also stated patient was receiving home health services through [facility].

## 2019-02-04 NOTE — Telephone Encounter (Signed)
Called husband back and we have scheduled a Telephone Palliative Consult for 02/08/19 @ 3 PM.

## 2019-02-06 ENCOUNTER — Encounter: Payer: Self-pay | Admitting: *Deleted

## 2019-02-06 ENCOUNTER — Inpatient Hospital Stay (HOSPITAL_BASED_OUTPATIENT_CLINIC_OR_DEPARTMENT_OTHER): Payer: Medicaid Other | Admitting: Obstetrics and Gynecology

## 2019-02-06 ENCOUNTER — Inpatient Hospital Stay: Payer: Medicaid Other | Admitting: Hospice and Palliative Medicine

## 2019-02-06 ENCOUNTER — Inpatient Hospital Stay (HOSPITAL_BASED_OUTPATIENT_CLINIC_OR_DEPARTMENT_OTHER): Payer: Medicaid Other | Admitting: Oncology

## 2019-02-06 ENCOUNTER — Encounter: Payer: Self-pay | Admitting: Obstetrics and Gynecology

## 2019-02-06 ENCOUNTER — Other Ambulatory Visit: Payer: Self-pay

## 2019-02-06 VITALS — BP 118/84 | HR 106 | Temp 98.8°F | Ht 62.0 in | Wt 188.8 lb

## 2019-02-06 VITALS — BP 118/84 | HR 100 | Temp 98.8°F | Wt 188.0 lb

## 2019-02-06 DIAGNOSIS — C569 Malignant neoplasm of unspecified ovary: Secondary | ICD-10-CM | POA: Diagnosis not present

## 2019-02-06 DIAGNOSIS — C673 Malignant neoplasm of anterior wall of bladder: Secondary | ICD-10-CM

## 2019-02-06 DIAGNOSIS — K219 Gastro-esophageal reflux disease without esophagitis: Secondary | ICD-10-CM

## 2019-02-06 DIAGNOSIS — I10 Essential (primary) hypertension: Secondary | ICD-10-CM

## 2019-02-06 DIAGNOSIS — D649 Anemia, unspecified: Secondary | ICD-10-CM

## 2019-02-06 DIAGNOSIS — E785 Hyperlipidemia, unspecified: Secondary | ICD-10-CM

## 2019-02-06 DIAGNOSIS — Z90722 Acquired absence of ovaries, bilateral: Secondary | ICD-10-CM

## 2019-02-06 DIAGNOSIS — Z9221 Personal history of antineoplastic chemotherapy: Secondary | ICD-10-CM

## 2019-02-06 DIAGNOSIS — Z7982 Long term (current) use of aspirin: Secondary | ICD-10-CM

## 2019-02-06 DIAGNOSIS — C786 Secondary malignant neoplasm of retroperitoneum and peritoneum: Secondary | ICD-10-CM | POA: Diagnosis not present

## 2019-02-06 DIAGNOSIS — C763 Malignant neoplasm of pelvis: Secondary | ICD-10-CM | POA: Diagnosis not present

## 2019-02-06 DIAGNOSIS — Z7189 Other specified counseling: Secondary | ICD-10-CM

## 2019-02-06 DIAGNOSIS — Z79899 Other long term (current) drug therapy: Secondary | ICD-10-CM

## 2019-02-06 MED ORDER — LIDOCAINE-PRILOCAINE 2.5-2.5 % EX CREA: TOPICAL_CREAM | CUTANEOUS | 3 refills | Status: AC

## 2019-02-06 MED ORDER — PROCHLORPERAZINE MALEATE 10 MG PO TABS: 10.0000 mg | ORAL_TABLET | Freq: Four times a day (QID) | ORAL | 1 refills | Status: AC | PRN

## 2019-02-06 NOTE — Progress Notes (Signed)
START ON PATHWAY REGIMEN - Ovarian     A cycle is every 21 days:     Paclitaxel      Carboplatin   **Always confirm dose/schedule in your pharmacy ordering system**  Patient Characteristics: Postoperative without Neoadjuvant Therapy (Pathologic Staging), Newly Diagnosed, Adjuvant Therapy, Stage IIIC Suboptimal and Stage IV Therapeutic Status: Postoperative without Neoadjuvant Therapy (Pathologic Staging) BRCA Mutation Status: Awaiting Test Results AJCC 8 Stage Grouping: IV AJCC M Category: cM1 AJCC T Category: pT3c AJCC N Category: pNX Intent of Therapy: Non-Curative / Palliative Intent, Discussed with Patient

## 2019-02-06 NOTE — Progress Notes (Signed)
Gynecologic Oncology Consult Visit   Referring Provider: Dr. 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 female who is seen in consultation from Dr. 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   She has a history significant for CAD, MVP, OSA, prior strokes, malnutrition, depression.  She was initially presented to ER in February and admitted for peritoneal carcinomatosis and bilateral complex ovarian masses.  She underwent exploratory laparotomy, omentectomy, extensive lysis of adhesions, bilateral salpingo-oophorectomy on 08/21/2018/Dr. Soper. Pathology:  08/21/2018-  A: Omentum, biopsy - Positive for high grade serous carcinoma, size of nodules up to 4.2 cm  B: Ovary and fallopian tube, right, salpingo-oophorectomy - High grade serous carcinoma involving right ovary and fimbria of right fallopian tube, size of tubo-ovarian complex 8.9 cm, most consistent with right fallopian tube primary tumor - See synoptic report and diagnosis comment  C: Ovary and fallopian tube, left, salpingo-oophorectomy - High grade serous carcinoma involving left ovary, size of ovary 8.5 cm - Fallopian tube with no definite involvement by carcinoma identified  D: Mesentery, small bowel, resection - Positive for high grade serous carcinoma involving fibroadipose tissue  E: Soft tissue, subcutaneous, resection - Focal high grade serous carcinoma in a background of fibroadipose tissue with fat necrosis and foreign body giant cell response  F: Omentum, partial omentectomy - Positive for high grade serous carcinoma, size of nodules up to 9.0 cm  Immunohistochemical stains were performed on block F3 (omentum) and demonstrate that the tumor is positive for PAX8, WT1, and p16. ER is positive in most of the cells (80-90%, variable intensity). p53 appears negative (null pattern) in F3 and in B6 (fallopian  tube tumor).  The findings are most consistent with high grade serous carcinoma of right fallopian tube origin.   Per the clinical team, the subcutanous tumor nodule is felt to represent metastatic disease, consistent with FIGO stage IVB.   She was taken back to the OR on 08/23/2018 for reexploration in the setting of suspected bowel perforation with bowel resection and reanastomosis.    08/23/2018-  A: Small bowel, resection -High-grade serous carcinoma, multifocally involving mesenteric fat and small bowel serosa with focal muscularis propria extension, up to 0.9 cm in greatest dimension, -Transmural small bowel defect, high-grade serous carcinoma involves adjacent serosal adipose tissue without muscularis propria extension identified in this area -High-grade serous carcinoma microscopically involves subserosal adipose tissue of an unoriented small bowel margin (9.5 cm away from transmural defect) -Opposing small bowel margin with acute serositis, negative for tumor -Uninvolved small bowel with acute serositis -Mesh material (see gross description) with associated fibroadipose tissue involved by high-grade serous carcinoma  She had repeat admission to [facility] on 3/31 and 4/8-4/12 due to wound infection and 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  She was admitted to [facility] on 4/16 with reports of somnolence through the day and found to be hypotensive, dehydration, significant hypokalemia K2.1.  Treated for sepsis.  On 4/19 found to have LUE numbness that progressed to LUE/LLE weakness with facial droop and dysarthria.  CT head negative for bleed and CTA negative for LVO.  CT abdomen showed postop changes and decrease in fluid along surgical wound.  CT chest showed right pleural effusion with atelectasis and mild left atelectasis.  Felt not to be a TPA candidate and neurology recommended full work-up and avoidance  of  hypotension.  MRI brain revealed multiple small infarcts in right corona radiata, subcortical right parietal lobe, and left cerebellum with moderate small vessel disease.  2D echo revealed EF 60-65% with negative agitation study and no wall abnormality.  Heme/onc was consulted for thrombocytopenia thought to be secondary to chemotherapy.  Stroke felt to be embolic question due to hypercoagulable state from sepsis/cancer.  She was admitted to rehab 11/09/2018 for inpatient PT/OT/ST and discharged to home on 11/29/2018.  She re-presented to ER on 11/30/2018 for weakness and fall and was discharged home.  Re-presented to ER on 12/07/2018, admitted for encephalopathy, H CAP, acute cystitis and discharged home on 12/13/2018.  Seen in ER on 12/14/2018 for fall at facility.  Discharged home.  Presented to ER in 01/19/2019 for arm swelling and admitted for sepsis, pneumonia, hypokalemia and discharged on 01/21/2019  CA 125 has been followed 08/19/2018 1,170 10/02/2018 554 10/23/2018 251  CA 19-9- 5.7 CEA- 5.8 (High)  Multiple comorbidities including CAD with stent placement, heart failure with preserved EF, anemia with transfusion history, OSA with CPAP, History of Sacrospinous hysteropexy, anterior repair, posterior repair, mid urethral retropubic mesh sling, cystoscopy with Dr. Dieter/urogynecology on 06/04/2018, and concomitant surgery by Dr. Narda Bonds for open repair of incarcerated epigastric hernia repair with mesh-difficult, leg swelling (PVL negative for DVT 08/2018).   On 2/27 she noted melena, fecal occult positive, dropping hemoglobin.  She underwent EGD which did not reveal significant finding.  Felt to be bleeding from anastomosis site and she was discharged home. 09/17/2018-upper GI endoscopy.   10/16/2018 CT abdomen pelvis with contrast showed a small outpouching from the small bowel anastomotic site,small to moderate right-sided pleural effusion, some nodularity along left lateral costophrenic angle.   10/23/2018  chemotherapy on -carboplatin AUC 5 at [facility].   11/01/2018 Right sided pleural effusion was also noted on CT chest wo contrast, with underlying atelectasis/infiltrate.   11/02/2018 Patient was found to have hypotension, hypokalemia also pneumonia/meeting sepsis criteria. Admitted 11/04/2018 left arm numbness and significantly weaker strength of left arm and left leg, mild dysarthria and facial droop. CT head was performed which showed no hemorrhagic changes and a CTA shows no LVO MRI of brain 11/04/2018 showed small acute infarcts right corona [employer]it[employer], subcortical right parietal lobe, left cerebellum. Due to chemotherapy induced thrombocytopenia, antiplatelet was delayed.   #Echocardiogram shows no cardiac source of emboli with an EF of60-65%  Patient was discharged to Rehab and could not follow up for gyn oncology care due to readmission.   12/07/2018 readmitted due to acute encephalopathy, acute cystitis, HCAP.   01/19/2019 Readmitted again due to CAP, Sepsis and discharged on 01/21/2019  01/23/2019  Patient saw Dr. Tasia Catchings on and expressed wish to transfer care to [facility]. Dr. Tasia Catchings referred her to [place] for evaluation and pelvic examination.   01/31/2019 CT C/A/P IMPRESSION: 1. There is a new nonspecific low-attenuation structure within the right hepatic lobe which may represent focal dilated bile duct, potentially sequelae of hepatic lesion, potentially treated given patient's current history of chemotherapy or focal fat and/or fluid.  2. There is similar-appearing tubular soft tissue density lower abdomen, posterior to the uterus which may be postsurgical in etiology. Recommend continued attention on follow-up. 3. Similar-appearing tiny nodularity with left upper quadrant adjacent to the stomach. Recommend attention on follow-up. 4. Persistent small to moderate right pleural effusion.  The patient is now living at home with her husband and grandson. She has recovered significantly since her  stroke. She has residual left  arm and hand weakness with episodic pain in that limb. She is interested in chemotherapy treatment.   Problem List: Patient Active Problem List   Diagnosis Date Noted  . Pneumonia 01/19/2019  . HAP (hospital-acquired pneumonia) 12/07/2018  . Acute blood loss anemia   . Chronic systolic congestive heart failure ([place])   . Embolic stroke ([place]) 82/50/5397  . 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 Thrombocytopenia ([place])   . Cerebrovascular accident (CVA) ([place])   . HLD (hyperlipidemia) 11/02/2018  . GERD (gastroesophageal reflux disease) 11/02/2018  . CAD (coronary artery disease) 11/02/2018  . Sleep apnea 11/02/2018  . Hypotension 11/02/2018  . Somnolence 11/02/2018  . Chronic diastolic heart failure ([place]) 11/15/2017  . HTN (hypertension) 11/15/2017  . Hypokalemia 11/05/2017  . Chest pain 02/14/2016  . Small bowel obstruction ([place]) 06/16/2015    Past Medical History: Past Medical History:  Diagnosis Date  . CAD (coronary artery disease)   . CHF (congestive heart failure) ([place])   . CVA (cerebral infarction)   . Female bladder prolapse   . GERD (gastroesophageal reflux disease)   . Hyperlipidemia   . Hypertension   . Hypokalemia   . Mitral valve disease   . Ovarian ca ([place])   . Sleep apnea   . Stroke [facility])    x3    Past Surgical History: Past Surgical History:  Procedure Laterality Date  . CARPAL TUNNEL RELEASE Bilateral   . CATARACT EXTRACTION Right 11/22/2017  . CHOLECYSTECTOMY    . COLON SURGERY    . TENNIS ELBOW RELEASE/NIRSCHEL PROCEDURE Left   . TONSILLECTOMY      Past Gynecologic History:  Menarche:  Menstrual details:  Menses regular:  Last Menstrual Period: History of OCP/HRT use:  History of Abnormal pap:  Last pap:  History of STDs: Contraception:  Sexually active:   OB History:  OB History  No obstetric history on file.    Family History: Family History  Problem Relation Age of [place]  . Heart disease  Other   . Hypertension Other   . Rheum arthritis Mother   . CAD Father     Social History: Social History   Socioeconomic History  . Marital status: Married    Spouse name: Not on file  . Number of children: Not on file  . Years of education: Not on file  . Highest education level: Not on file  Occupational History  . Not on file  Social Needs  . Financial resource strain: Patient refused  . Food insecurity    Worry: Never true    Inability: Never true  . Transportation needs    Medical: No    Non-medical: No  Tobacco Use  . Smoking status: Never Smoker  . Smokeless tobacco: Never Used  Substance and Sexual Activity  . Alcohol use: No    Alcohol/week: 0.0 standard drinks  . Drug use: No  . Sexual activity: Not Currently  Lifestyle  . Physical activity    Days per week: 0 days    Minutes per session: 0 min  . Stress: Only a little  Relationships  . Social Herbalist on phone: Twice a week    Gets together: Once a week    Attends religious service: 1 to 4 times per year    Active member of club or organization: No    Attends meetings of clubs or organizations: Never    Relationship status: Married  . Intimate partner violence    Fear  of current or ex partner: No    Emotionally abused: No    Physically abused: No    Forced sexual activity: No  Other Topics Concern  . Not on file  Social History Narrative   Recently released from [place], Lives home with husband    Allergies: Allergies  Allergen Reactions  . Atorvastatin Hives    No s/sx of anaphylaxis, just intermittent hives.  Does not seem to be a class effect as she has tolerated lovastatin previously No s/sx of anaphylaxis, just intermittent hives.  Does not seem to be a class effect as she has tolerated lovastatin previously  . Fluconazole Rash    Reaction to generic but not name brand Reaction to generic but not name brand CAN USE DIFLUCAN.DOES NOT BREAK OUT WITH DIFLUCAN.(allergic only  to generic brand)  . Sertraline Itching  . Sulfa Antibiotics Rash and Other (See Comments)    "hot " sensation  . Sulfamethoxazole-Trimethoprim Rash and Other (See Comments)    "felt hot" Other reaction(s): Other (See Comments) "felt hot"  . Sulfasalazine Other (See Comments) and Rash    "hot " sensation    Current Medications: Current Outpatient Medications  Medication Sig Dispense Refill  . acetaminophen (TYLENOL) 325 MG tablet Take 1-2 tablets (325-650 mg total) by mouth every 4 (four) hours as needed for mild pain.    Marland Kitchen aspirin 81 MG chewable tablet Chew 81 mg by mouth daily.    . cephALEXin (KEFLEX) 500 MG capsule Take 1 capsule (500 mg total) by mouth 3 (three) times daily for 7 days. 21 capsule 0  . Cholecalciferol (VITAMIN D3) 125 MCG (5000 UT) CAPS Take 1 capsule (5,000 Units total) by mouth daily. 30 capsule 0  . clonazePAM (KLONOPIN) 0.5 MG tablet Take 1 tablet (0.5 mg total) by mouth at bedtime as needed for anxiety. 20 tablet 0  . clopidogrel (PLAVIX) 75 MG tablet Take 1 tablet (75 mg total) by mouth daily. 30 tablet 1  . Docusate Calcium (STOOL SOFTENER PO) Take 100 mg by mouth daily.     . famotidine (PEPCID) 20 MG tablet Take 1 tablet (20 mg total) by mouth 2 (two) times daily. 60 tablet 0  . fluconazole (DIFLUCAN) 150 MG tablet Take 1 tablet (150 mg total) by mouth daily. 1 tablet 0  . Melatonin 3 MG TABS Take 3 mg by mouth at bedtime.    . nitroGLYCERIN (NITROSTAT) 0.4 MG SL tablet Place 0.4 mg under the tongue every 5 (five) minutes as needed for chest pain.    Marland Kitchen ondansetron (ZOFRAN) 4 MG tablet Take 4 mg by mouth every 8 (eight) hours as needed for nausea or vomiting.    Marland Kitchen oxybutynin (DITROPAN) 5 MG tablet Take 1 tablet (5 mg total) by mouth 2 (two) times daily. 60 tablet 0  . oxyCODONE (OXY IR/ROXICODONE) 5 MG immediate release tablet Take 1-2 tablets (5-10 mg total) by mouth every 6 (six) hours as needed for severe pain. 60 tablet 0  . PARoxetine (PAXIL) 30 MG  tablet Take 1 tablet (30 mg total) by mouth daily. 30 tablet 0  . pregabalin (LYRICA) 150 MG capsule Take 1 capsule (150 mg total) by mouth at bedtime. 30 capsule 0  . pregabalin (LYRICA) 75 MG capsule Take 1 capsule (75 mg total) by mouth 2 (two) times daily. 60 capsule 0  . rosuvastatin (CRESTOR) 20 MG tablet Take 1 tablet (20 mg total) by mouth at bedtime. 30 tablet 0  . spironolactone (ALDACTONE) 25 MG tablet  Take 0.5 tablets (12.5 mg total) by mouth daily. 30 tablet 0  . venlafaxine XR (EFFEXOR-XR) 75 MG 24 hr capsule Take 75 mg by mouth daily with breakfast.    . amoxicillin-clavulanate (AUGMENTIN) 875-125 MG tablet Take 1 tablet by mouth every 12 (twelve) hours. (Patient not taking: Reported on 02/06/2019) 7 tablet 0  . doxycycline (VIBRA-TABS) 100 MG tablet Take 1 tablet (100 mg total) by mouth every 12 (twelve) hours. (Patient not taking: Reported on 02/06/2019) 7 tablet 0   No current facility-administered medications for this visit.     Review of Systems General: weight  Loss  Skin: negative for changes in  lesions Eyes: negative for changes in vision HEENT: hearing loss and voice changes Pulmonary: negative for dyspnea, productive cough Cardiac: negative for palpitations, or pain Gastrointestinal: constipation o/w negative for nausea, vomiting, diarrhea, hematemesis, hematochezia Genitourinary/Sexual: retention, o/w negative for dysuria, or incontinence Ob/Gyn: negative for irregular bleeding Musculoskeletal:  Stiffness left shoulder Hematology: easy bruising Neurologic/Psych:  headaches, weakness, numbess   Objective:  Physical Examination:  BP 118/84 (BP Location: Left Arm, Patient Position: Sitting)   Pulse (!) 106   Temp 98.8 F (37.1 C) (Tympanic)   Ht 5' 2" (1.575 m)   Wt 188 lb 12.8 oz (85.6 kg)   BMI 34.53 kg/m     ECOG Performance Status: 2 - Symptomatic, <50% confined to bed  General appearance: alert, cooperative and appears older than stated age, limited  mobility and arrived in a wheelchair HEENT: normal Lymph node survey: non-palpable, axillary, inguinal, supraclavicular Cardiovascular: regular rate and rhythm Respiratory: clear to auscultation Abdomen: soft nondistended and nontender. No ascites or mass or hepatosplenomegaly. Incisional ventral hernia. No evidence of fistula. The wound has completely healed.  Back: inspection of back is normal Extremities: no significant edema Skin exam - normal coloration and turgor, no rashes, no suspicious skin lesions noted. Neurological exam reveals alert, oriented, normal speech, no focal findings or movement disorder noted.  Pelvic: EGBUS: no lesions Cervix: absent Vagina: no lesions, no discharge or bleeding Uterus: absent Adnexa: no palpable masses Rectovaginal: deferred  Lab Review n/a  Radiologic Imaging: reviewed    Assessment:  Destiny [name] is a 63 y.o. 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   Multiple comorbid conditions including: CAD, CHF, CVA with residual LUE weakness, HTN, Mitral valve disease, sleep apnea, Body mass index is 34.53 kg/m.  Plan:   Problem List Items Addressed This Visit    None    Visit Diagnoses    Malignant neoplasm of ovary, unspecified laterality ([place])    -  Primary      We discussed options for management and I recommended consideration for continued single agent chemotherapy. We did discuss not further active therapy as well. I also review goals of care including quality of life, disease control, and cure. She is aware that the likelihood of cure is very low. She is interested in extensive on survival if possible. We also discussed HCPOA - it sounds like her husband is her HCPOA. I recommended discussing DNAR and advanced care directives with her family and making sure she has her legal affairs in order. At this point she would like to be a "full code".   She will see Dr. Tasia Catchings for  continued management and plan is to restart carboplatin at AUC=4 and to increase the dose and add paclitaxel if she is able to tolerate. The paclitaxel can be given weekly with a  one week break on a four week schedule.   I also recommended genetic and somatic testing. Knowing this information may help with prognosis counseling and future treatment decisions as well as cascade testing if she should be a germline mutation carrier.   Suggested return to clinic in  3 months.    The patient's diagnosis, an outline of the further diagnostic and laboratory studies which will be required, the recommendation for surgery, and alternatives were discussed with her and her accompanying family members.  All questions were answered to their satisfaction.  A total of 80 minutes were spent with the patient/family today; >50% was spent in education, counseling and coordination of care for tubal cancer.    I personally had a face to face interaction and evaluated the patient jointly with the NP, Ms. Beckey Rutter.  I have reviewed her history and available records and have performed the key portions of the physical exam including lymph node survey, abdominal exam, pelvic exam with my findings confirming those documented above by the APP.  I have discussed the case with the APP and the patient.  I agree with the above documentation, assessment and plan which was fully formulated by me.  Counseling was completed by me.   I personally saw the patient and performed a substantive portion of this encounter in conjunction with the listed APP as documented above.   Angeles Gaetana Michaelis, MD    CC:  Dr. Tasia Catchings

## 2019-02-07 ENCOUNTER — Encounter: Payer: Self-pay | Admitting: Oncology

## 2019-02-07 DIAGNOSIS — Z7189 Other specified counseling: Secondary | ICD-10-CM | POA: Insufficient documentation

## 2019-02-07 NOTE — Patient Instructions (Signed)
Carboplatin injection What is this medicine? CARBOPLATIN (KAR boe pla tin) is a chemotherapy drug. It targets fast dividing cells, like cancer cells, and causes these cells to die. This medicine is used to treat ovarian cancer and many other cancers. This medicine may be used for other purposes; ask your health care provider or pharmacist if you have questions. COMMON BRAND NAME(S): Paraplatin What should I tell my health care provider before I take this medicine? They need to know if you have any of these conditions:  blood disorders  hearing problems  kidney disease  recent or ongoing radiation therapy  an unusual or allergic reaction to carboplatin, cisplatin, other chemotherapy, other medicines, foods, dyes, or preservatives  pregnant or trying to get pregnant  breast-feeding How should I use this medicine? This drug is usually given as an infusion into a vein. It is administered in a hospital or clinic by a specially trained health care professional. Talk to your pediatrician regarding the use of this medicine in children. Special care may be needed. Overdosage: If you think you have taken too much of this medicine contact a poison control center or emergency room at once. NOTE: This medicine is only for you. Do not share this medicine with others. What if I miss a dose? It is important not to miss a dose. Call your doctor or health care professional if you are unable to keep an appointment. What may interact with this medicine?  medicines for seizures  medicines to increase blood counts like filgrastim, pegfilgrastim, sargramostim  some antibiotics like amikacin, gentamicin, neomycin, streptomycin, tobramycin  vaccines Talk to your doctor or health care professional before taking any of these medicines:  acetaminophen  aspirin  ibuprofen  ketoprofen  naproxen This list may not describe all possible interactions. Give your health care provider a list of all the  medicines, herbs, non-prescription drugs, or dietary supplements you use. Also tell them if you smoke, drink alcohol, or use illegal drugs. Some items may interact with your medicine. What should I watch for while using this medicine? Your condition will be monitored carefully while you are receiving this medicine. You will need important blood work done while you are taking this medicine. This drug may make you feel generally unwell. This is not uncommon, as chemotherapy can affect healthy cells as well as cancer cells. Report any side effects. Continue your course of treatment even though you feel ill unless your doctor tells you to stop. In some cases, you may be given additional medicines to help with side effects. Follow all directions for their use. Call your doctor or health care professional for advice if you get a fever, chills or sore throat, or other symptoms of a cold or flu. Do not treat yourself. This drug decreases your body's ability to fight infections. Try to avoid being around people who are sick. This medicine may increase your risk to bruise or bleed. Call your doctor or health care professional if you notice any unusual bleeding. Be careful brushing and flossing your teeth or using a toothpick because you may get an infection or bleed more easily. If you have any dental work done, tell your dentist you are receiving this medicine. Avoid taking products that contain aspirin, acetaminophen, ibuprofen, naproxen, or ketoprofen unless instructed by your doctor. These medicines may hide a fever. Do not become pregnant while taking this medicine. Women should inform their doctor if they wish to become pregnant or think they might be pregnant. There is a   potential for serious side effects to an unborn child. Talk to your health care professional or pharmacist for more information. Do not breast-feed an infant while taking this medicine. What side effects may I notice from receiving this  medicine? Side effects that you should report to your doctor or health care professional as soon as possible:  allergic reactions like skin rash, itching or hives, swelling of the face, lips, or tongue  signs of infection - fever or chills, cough, sore throat, pain or difficulty passing urine  signs of decreased platelets or bleeding - bruising, pinpoint red spots on the skin, black, tarry stools, nosebleeds  signs of decreased red blood cells - unusually weak or tired, fainting spells, lightheadedness  breathing problems  changes in hearing  changes in vision  chest pain  high blood pressure  low blood counts - This drug may decrease the number of white blood cells, red blood cells and platelets. You may be at increased risk for infections and bleeding.  nausea and vomiting  pain, swelling, redness or irritation at the injection site  pain, tingling, numbness in the hands or feet  problems with balance, talking, walking  trouble passing urine or change in the amount of urine Side effects that usually do not require medical attention (report to your doctor or health care professional if they continue or are bothersome):  hair loss  loss of appetite  metallic taste in the mouth or changes in taste This list may not describe all possible side effects. Call your doctor for medical advice about side effects. You may report side effects to FDA at 1-800-FDA-1088. Where should I keep my medicine? This drug is given in a hospital or clinic and will not be stored at home. NOTE: This sheet is a summary. It may not cover all possible information. If you have questions about this medicine, talk to your doctor, pharmacist, or health care provider.  2020 [employer] (2007-10-09 14:38:05)  

## 2019-02-07 NOTE — Progress Notes (Signed)
Hematology/Oncology Follow Up Note [facility]  [phone] [phone]  Patient Care Team: [name], MD as PCP - General (Family Medicine)   Name of the patient: Destiny [name]  [id number]  Oct 12, 1955   REASON FOR VISIT Follow up for management of stage IV high-grade serous serous carcinoma    HPI  Destiny [name] is a 63 y.o.afemale who has above oncology history reviewed by me today presented for follow up visit for management of ovarian cancer.  I first met patient on 11/05/2018, during the admission after she developed acute stroke after received chemotherapy.  Her oncology history listed as followed.  # stage IV high-grade serous serous carcinoma- [ preop CA125 1170] who underwent bilateral salpingo-oophorectomy 03/19/3299 complicated by bowel perforation status post exploratory laparotomy, small bowel resection, and reanastomosis, debridement 08/31/2018, [post op CA 125 251].  Her oncology care was at [facility] Dr.Soper. Per Dr.Soper, last chemotherapy was on 10/23/2018, s/p Carboplatin AUC 5.   # 11/02/2018  Patient was found to have hypotension, hypokalemia also pneumonia/meeting sepsis criteria.  Admitted  # 11/04/2018 left arm numbness and significantly weaker strength of left arm and left leg, mild dysarthria and facial droop. CT head was performed which showed no hemorrhagic changes and a CTA shows no LVO MRI of brain 11/04/2018 showed small acute infarcts right corona [employer]it[employer], subcortical right parietal lobe, left cerebellum. Due to chemotherapy induced thrombocytopenia, antiplatelet was delayed.   #Echocardiogram shows no cardiac source of emboli with an EF of60-65% # 10/16/2018 CT abdomen pelvis with contrast   showed a small outpouching from the small bowel anastomotic site, small to moderate right-sided pleural effusion, some nodularity along left lateral costophrenic angle.  Right sided pleural effusion was also noted on CT chest  wo contrast on 11/01/2018, with underlying atelectasis/infiltrate.  Patient was discharged to Rehab. Patient has not been able to follow up due to readmission.   # 12/07/2018 readmitted due to acute encephalopathy, acute cystitis, HCAP.  # Readmitted again on 01/19/2019, due to CAP, Sepsis.  Discharged on 01/21/2019 Patient and her husband prefer to transfer her oncology care to [facility] and will also want to establish care with [place] oncology.   INTERVAL HISTORY Destiny [name] is a 63 y.o. female who has above history reviewed by me today presents for follow up visit for management of stage IV high-grade ovarian serous carcinoma Problems and complaints are listed below: Patient had blood work done and repeat CT done since last visit.  Present to discuss results and finalize management plan. Also called patient's husband Mr. Polinsky and he was able to overheard our conversation and participate in discussion. Overall patient has improved since her last visit 2 weeks ago.  Cough and shortness of breath has completely resolved.  She finished antibiotics.  She she has a dressing covering her mid abdomen wound.  No additional discharges. Husband reports that he is in the process of applying handicap apartment for patient. Denies  new complaints  Review of Systems  Constitutional: Positive for fatigue. Negative for appetite change, chills, fever and unexpected weight change.  HENT:   Positive for hearing loss. Negative for voice change.        She wears hearing aid today  Eyes: Negative for eye problems.  Respiratory: Negative for chest tightness, cough and shortness of breath.   Cardiovascular: Negative for chest pain.  Gastrointestinal: Negative for abdominal distention and blood in stool.  Endocrine: Negative for hot flashes.  Genitourinary:  Negative for difficulty urinating and frequency.   Musculoskeletal: Negative for arthralgias.  Skin: Negative for itching and rash.  Neurological: Positive  for extremity weakness.       Chronic left side weakness  Hematological: Negative for adenopathy.  Psychiatric/Behavioral: Negative for confusion.      Allergies  Allergen Reactions   Atorvastatin Hives    No s/sx of anaphylaxis, just intermittent hives.  Does not seem to be a class effect as she has tolerated lovastatin previously No s/sx of anaphylaxis, just intermittent hives.  Does not seem to be a class effect as she has tolerated lovastatin previously   Fluconazole Rash    Reaction to generic but not name brand Reaction to generic but not name brand CAN USE DIFLUCAN.DOES NOT BREAK OUT WITH DIFLUCAN.(allergic only to generic brand)   Sertraline Itching   Sulfa Antibiotics Rash and Other (See Comments)    "hot " sensation   Sulfamethoxazole-Trimethoprim Rash and Other (See Comments)    "felt hot" Other reaction(s): Other (See Comments) "felt hot"   Sulfasalazine Other (See Comments) and Rash    "hot " sensation     Past Medical History:  Diagnosis Date   CAD (coronary artery disease)    CHF (congestive heart failure) (HCC)    CVA (cerebral infarction)    Female bladder prolapse    GERD (gastroesophageal reflux disease)    Hyperlipidemia    Hypertension    Hypokalemia    Mitral valve disease    Ovarian ca ([place])    Sleep apnea    Stroke ([place])    x3     Past Surgical History:  Procedure Laterality Date   CARPAL TUNNEL RELEASE Bilateral    CATARACT EXTRACTION Right 11/22/2017   CHOLECYSTECTOMY     COLON SURGERY     TENNIS ELBOW RELEASE/NIRSCHEL PROCEDURE Left    TONSILLECTOMY      Social History   Socioeconomic History   Marital status: Married    Spouse name: Not on file   Number of children: Not on file   Years of education: Not on file   Highest education level: Not on file  Occupational History   Not on file  Social Needs   Financial resource strain: Patient refused   Food insecurity    Worry: Never true     Inability: Never true   Transportation needs    Medical: No    Non-medical: No  Tobacco Use   Smoking status: Never Smoker   Smokeless tobacco: Never Used  Substance and Sexual Activity   Alcohol use: No    Alcohol/week: 0.0 standard drinks   Drug use: No   Sexual activity: Not Currently  Lifestyle   Physical activity    Days per week: 0 days    Minutes per session: 0 min   Stress: Only a little  Relationships   Social connections    Talks on phone: Twice a week    Gets together: Once a week    Attends religious service: 1 to 4 times per year    Active member of club or organization: No    Attends meetings of clubs or organizations: Never    Relationship status: Married   Intimate partner violence    Fear of current or ex partner: No    Emotionally abused: No    Physically abused: No    Forced sexual activity: No  Other Topics Concern   Not on file  Social History Narrative   Recently released from  Rehab, Lives home with husband    Family History  Problem Relation Age of Onset   Heart disease Other    Hypertension Other    Rheum arthritis Mother    CAD Father      Current Outpatient Medications:    acetaminophen (TYLENOL) 325 MG tablet, Take 1-2 tablets (325-650 mg total) by mouth every 4 (four) hours as needed for mild pain., Disp: , Rfl:    amoxicillin-clavulanate (AUGMENTIN) 875-125 MG tablet, Take 1 tablet by mouth every 12 (twelve) hours. (Patient not taking: Reported on 02/06/2019), Disp: 7 tablet, Rfl: 0   aspirin 81 MG chewable tablet, Chew 81 mg by mouth daily., Disp: , Rfl:    cephALEXin (KEFLEX) 500 MG capsule, Take 1 capsule (500 mg total) by mouth 3 (three) times daily for 7 days., Disp: 21 capsule, Rfl: 0   Cholecalciferol (VITAMIN D3) 125 MCG (5000 UT) CAPS, Take 1 capsule (5,000 Units total) by mouth daily., Disp: 30 capsule, Rfl: 0   clonazePAM (KLONOPIN) 0.5 MG tablet, Take 1 tablet (0.5 mg total) by mouth at bedtime as needed  for anxiety., Disp: 20 tablet, Rfl: 0   clopidogrel (PLAVIX) 75 MG tablet, Take 1 tablet (75 mg total) by mouth daily., Disp: 30 tablet, Rfl: 1   Docusate Calcium (STOOL SOFTENER PO), Take 100 mg by mouth daily. , Disp: , Rfl:    doxycycline (VIBRA-TABS) 100 MG tablet, Take 1 tablet (100 mg total) by mouth every 12 (twelve) hours. (Patient not taking: Reported on 02/06/2019), Disp: 7 tablet, Rfl: 0   famotidine (PEPCID) 20 MG tablet, Take 1 tablet (20 mg total) by mouth 2 (two) times daily., Disp: 60 tablet, Rfl: 0   fluconazole (DIFLUCAN) 150 MG tablet, Take 1 tablet (150 mg total) by mouth daily., Disp: 1 tablet, Rfl: 0   lidocaine-prilocaine (EMLA) cream, Apply to affected area once, Disp: 30 g, Rfl: 3   Melatonin 3 MG TABS, Take 3 mg by mouth at bedtime., Disp: , Rfl:    nitroGLYCERIN (NITROSTAT) 0.4 MG SL tablet, Place 0.4 mg under the tongue every 5 (five) minutes as needed for chest pain., Disp: , Rfl:    ondansetron (ZOFRAN) 4 MG tablet, Take 4 mg by mouth every 8 (eight) hours as needed for nausea or vomiting., Disp: , Rfl:    oxybutynin (DITROPAN) 5 MG tablet, Take 1 tablet (5 mg total) by mouth 2 (two) times daily., Disp: 60 tablet, Rfl: 0   oxyCODONE (OXY IR/ROXICODONE) 5 MG immediate release tablet, Take 1-2 tablets (5-10 mg total) by mouth every 6 (six) hours as needed for severe pain., Disp: 60 tablet, Rfl: 0   PARoxetine (PAXIL) 30 MG tablet, Take 1 tablet (30 mg total) by mouth daily., Disp: 30 tablet, Rfl: 0   pregabalin (LYRICA) 150 MG capsule, Take 1 capsule (150 mg total) by mouth at bedtime., Disp: 30 capsule, Rfl: 0   pregabalin (LYRICA) 75 MG capsule, Take 1 capsule (75 mg total) by mouth 2 (two) times daily., Disp: 60 capsule, Rfl: 0   prochlorperazine (COMPAZINE) 10 MG tablet, Take 1 tablet (10 mg total) by mouth every 6 (six) hours as needed (Nausea or vomiting)., Disp: 30 tablet, Rfl: 1   rosuvastatin (CRESTOR) 20 MG tablet, Take 1 tablet (20 mg total) by  mouth at bedtime., Disp: 30 tablet, Rfl: 0   spironolactone (ALDACTONE) 25 MG tablet, Take 0.5 tablets (12.5 mg total) by mouth daily., Disp: 30 tablet, Rfl: 0   venlafaxine XR (EFFEXOR-XR) 75 MG 24 hr  capsule, Take 75 mg by mouth daily with breakfast., Disp: , Rfl:   Physical exam:  Vitals are stable.  See chart.  ECOG 2 Physical Exam Constitutional:      General: She is not in acute distress.    Comments: Frail appearance, sitting in wheelchair.   HENT:     Head: Normocephalic and atraumatic.  Eyes:     General: No scleral icterus.    Pupils: Pupils are equal, round, and reactive to light.  Neck:     Musculoskeletal: Normal range of motion and neck supple.  Cardiovascular:     Rate and Rhythm: Normal rate and regular rhythm.     Heart sounds: Normal heart sounds.  Pulmonary:     Effort: Pulmonary effort is normal. No respiratory distress.     Breath sounds: No wheezing.  Abdominal:     General: Bowel sounds are normal. There is no distension.     Palpations: Abdomen is soft. There is no mass.     Tenderness: There is no abdominal tenderness.  Musculoskeletal: Normal range of motion.        General: No deformity.  Skin:    General: Skin is warm and dry.     Findings: No erythema or rash.     Comments: Abdomen mid-line scar, well-healed.  No erythema or discharge.   Neurological:     Mental Status: She is alert.     Cranial Nerves: No cranial nerve deficit.     Coordination: Coordination normal.     Comments: She wears hearing aid Left upper extremity 2/5,  Other extremities 3-4/5.    Psychiatric:        Behavior: Behavior normal.        Thought Content: Thought content normal.     CMP Latest Ref Rng & Units 02/01/2019  Glucose 70 - 99 mg/dL 115(H)  BUN 8 - 23 mg/dL 14  Creatinine 0.44 - 1.00 mg/dL 0.92  Sodium 135 - 145 mmol/L 139  Potassium 3.5 - 5.1 mmol/L 3.4(L)  Chloride 98 - 111 mmol/L 104  CO2 22 - 32 mmol/L 25  Calcium 8.9 - 10.3 mg/dL 9.2  Total  Protein 6.5 - 8.1 g/dL 7.9  Total Bilirubin 0.3 - 1.2 mg/dL 0.5  Alkaline Phos 38 - 126 U/L 69  AST 15 - 41 U/L 25  ALT 0 - 44 U/L 17   CBC Latest Ref Rng & Units 02/01/2019  WBC 4.0 - 10.5 K/uL 8.0  Hemoglobin 12.0 - 15.0 g/dL 9.9(L)  Hematocrit 36.0 - 46.0 % 31.2(L)  Platelets 150 - 400 K/uL 219   RADIOGRAPHIC STUDIES: I have personally reviewed the radiological images as listed and agreed with the findings in the report.  Ct Chest W Contrast  Result Date: 01/31/2019 CLINICAL DATA:  History of ovarian carcinoma.  Follow-up exam. EXAM: CT CHEST, ABDOMEN, AND PELVIS WITH CONTRAST TECHNIQUE: Multidetector CT imaging of the chest, abdomen and pelvis was performed following the standard protocol during bolus administration of intravenous contrast. CONTRAST:  167m OMNIPAQUE IOHEXOL 300 MG/ML  SOLN COMPARISON:  CT abdomen pelvis 10/16/2018 FINDINGS: CT CHEST FINDINGS Cardiovascular: Right anterior chest wall Port-A-Cath is present with tip terminating in the superior vena cava. Normal heart size. Thoracic aortic vascular calcifications. Mediastinum/Nodes: No enlarged axillary, mediastinal or hilar lymphadenopathy. Normal appearance of the esophagus. Lungs/Pleura: Central airways are patent. Dependent atelectasis. Persistent small right pleural effusion. No pneumothorax. Musculoskeletal: Thoracic spine degenerative changes. No aggressive or acute appearing osseous lesions. Healing posterior left twelfth rib  fracture with callus formation. CT ABDOMEN PELVIS FINDINGS Hepatobiliary: The liver is normal in size and contour. Prior cholecystectomy. There is a new nonspecific linear low-attenuation structure within the right hepatic lobe (image 60; series 2). Pancreas: Mildly atrophic. Spleen: Unremarkable Adrenals/Urinary Tract: Normal adrenal glands. Kidneys enhance symmetrically with contrast. No hydronephrosis. Urinary bladder is unremarkable. Stomach/Bowel: Normal morphology of the stomach. No evidence for  small bowel obstruction. No free fluid or free intraperitoneal air. Prior omentectomy. Tiny nodules within the left upper quadrant (image 68; series 5) are stable. Vascular/Lymphatic: Normal caliber abdominal aorta. Peripheral calcified atherosclerotic plaque. No retroperitoneal lymphadenopathy. Similar-appearing 8 mm aortocaval node (image 60; series 2). Reproductive: Prior hysterectomy. Other: Postsurgical changes involving the anterior abdominal wall. There is tubular soft tissue density within the central pelvis (image 91; series 2), located posterior to the uterus. Musculoskeletal: Lumbar spine degenerative changes. No aggressive or acute appearing osseous lesions. IMPRESSION: 1. There is a new nonspecific low-attenuation structure within the right hepatic lobe which may represent focal dilated bile duct, potentially sequelae of hepatic lesion, potentially treated given patient's current history of chemotherapy or focal fat and/or fluid. Consider dedicated evaluation with pre and post contrast-enhanced MRI. 2. There is similar-appearing tubular soft tissue density lower abdomen, posterior to the uterus which may be postsurgical in etiology. Recommend continued attention on follow-up. 3. Similar-appearing tiny nodularity with left upper quadrant adjacent to the stomach. Recommend attention on follow-up. 4. Persistent small to moderate right pleural effusion. Electronically Signed   By: Lovey Newcomer M.D.   On: 01/31/2019 20:52   Ct Abdomen Pelvis W Contrast  Result Date: 01/31/2019 CLINICAL DATA:  History of ovarian carcinoma.  Follow-up exam. EXAM: CT CHEST, ABDOMEN, AND PELVIS WITH CONTRAST TECHNIQUE: Multidetector CT imaging of the chest, abdomen and pelvis was performed following the standard protocol during bolus administration of intravenous contrast. CONTRAST:  123m OMNIPAQUE IOHEXOL 300 MG/ML  SOLN COMPARISON:  CT abdomen pelvis 10/16/2018 FINDINGS: CT CHEST FINDINGS Cardiovascular: Right anterior  chest wall Port-A-Cath is present with tip terminating in the superior vena cava. Normal heart size. Thoracic aortic vascular calcifications. Mediastinum/Nodes: No enlarged axillary, mediastinal or hilar lymphadenopathy. Normal appearance of the esophagus. Lungs/Pleura: Central airways are patent. Dependent atelectasis. Persistent small right pleural effusion. No pneumothorax. Musculoskeletal: Thoracic spine degenerative changes. No aggressive or acute appearing osseous lesions. Healing posterior left twelfth rib fracture with callus formation. CT ABDOMEN PELVIS FINDINGS Hepatobiliary: The liver is normal in size and contour. Prior cholecystectomy. There is a new nonspecific linear low-attenuation structure within the right hepatic lobe (image 60; series 2). Pancreas: Mildly atrophic. Spleen: Unremarkable Adrenals/Urinary Tract: Normal adrenal glands. Kidneys enhance symmetrically with contrast. No hydronephrosis. Urinary bladder is unremarkable. Stomach/Bowel: Normal morphology of the stomach. No evidence for small bowel obstruction. No free fluid or free intraperitoneal air. Prior omentectomy. Tiny nodules within the left upper quadrant (image 68; series 5) are stable. Vascular/Lymphatic: Normal caliber abdominal aorta. Peripheral calcified atherosclerotic plaque. No retroperitoneal lymphadenopathy. Similar-appearing 8 mm aortocaval node (image 60; series 2). Reproductive: Prior hysterectomy. Other: Postsurgical changes involving the anterior abdominal wall. There is tubular soft tissue density within the central pelvis (image 91; series 2), located posterior to the uterus. Musculoskeletal: Lumbar spine degenerative changes. No aggressive or acute appearing osseous lesions. IMPRESSION: 1. There is a new nonspecific low-attenuation structure within the right hepatic lobe which may represent focal dilated bile duct, potentially sequelae of hepatic lesion, potentially treated given patient's current history of  chemotherapy or focal fat and/or fluid. Consider dedicated  evaluation with pre and post contrast-enhanced MRI. 2. There is similar-appearing tubular soft tissue density lower abdomen, posterior to the uterus which may be postsurgical in etiology. Recommend continued attention on follow-up. 3. Similar-appearing tiny nodularity with left upper quadrant adjacent to the stomach. Recommend attention on follow-up. 4. Persistent small to moderate right pleural effusion. Electronically Signed   By: Lovey Newcomer M.D.   On: 01/31/2019 20:52   US Abdomen Limited  Result Date: 01/19/2019 CLINICAL DATA:  Bloody purulence drainage from surgical site for 2-3 months. EXAM: ULTRASOUND ABDOMEN LIMITED COMPARISON:  None. FINDINGS: There is linear hypoechogenicity in the region of the patient's scar. The finding is at least partially due to scarring. However, a small amount of tracking fluid is not excluded given history. There is hypervascularity in this region. IMPRESSION: 1. Linear hypo vascularity in the region of the patient's scar and drainage is identified. The findings are at least partially due to scarring. However, I do suspect there may be some fluid tracking through this region. No discrete abscess. Hypervascularity is noted. CT imaging could further evaluate for abscess if there is concern. Electronically Signed   By: Dorise Bullion III M.D   On: 01/19/2019 20:54   US Venous Img Upper Uni Left  Result Date: 01/19/2019 CLINICAL DATA:  Left upper extremity swelling. EXAM: LEFT UPPER EXTREMITY VENOUS DOPPLER ULTRASOUND TECHNIQUE: Gray-scale sonography with graded compression, as well as color Doppler and duplex ultrasound were performed to evaluate the upper extremity deep venous system from the level of the subclavian vein and including the jugular, axillary, basilic, radial, ulnar and upper cephalic vein. Spectral Doppler was utilized to evaluate flow at rest and with distal augmentation maneuvers. COMPARISON:  None.  FINDINGS: Contralateral Subclavian Vein: Respiratory phasicity is normal and symmetric with the symptomatic side. No evidence of thrombus. Normal compressibility. Internal Jugular Vein: No evidence of thrombus. Normal compressibility, respiratory phasicity and response to augmentation. Subclavian Vein: No evidence of thrombus. Normal compressibility, respiratory phasicity and response to augmentation. Axillary Vein: No evidence of thrombus. Normal compressibility, respiratory phasicity and response to augmentation. Cephalic Vein: No evidence of thrombus. Normal compressibility, respiratory phasicity and response to augmentation. Basilic Vein: No evidence of thrombus. Normal compressibility, respiratory phasicity and response to augmentation. Brachial Veins: No evidence of thrombus. Normal compressibility, respiratory phasicity and response to augmentation. Radial Veins: No evidence of thrombus. Normal compressibility, respiratory phasicity and response to augmentation. Ulnar Veins: No evidence of thrombus. Normal compressibility, respiratory phasicity and response to augmentation. Venous Reflux:  None visualized. Other Findings:  None visualized. IMPRESSION: No evidence of DVT within the left upper extremity. Electronically Signed   By: Dorise Bullion III M.D   On: 01/19/2019 20:52   Dg Chest Portable 1 View  Result Date: 02/01/2019 CLINICAL DATA:  63 year old female with fever. EXAM: PORTABLE CHEST 1 VIEW COMPARISON:  Chest CT dated 01/31/2019 FINDINGS: Right pectoral Port-A-Cath with tip at the cavoatrial junction. Trace right pleural effusion. No focal consolidation or pneumothorax. The cardiac silhouette is within normal limits. No acute osseous pathology. IMPRESSION: Trace right pleural effusion. No focal consolidation. Electronically Signed   By: Anner Crete M.D.   On: 02/01/2019 22:48   Dg Chest Port 1 View  Result Date: 01/19/2019 CLINICAL DATA:  Possible sepsis. EXAM: PORTABLE CHEST 1 VIEW  COMPARISON:  Chest radiograph 12/07/2018 FINDINGS: Monitoring leads overlie the patient. Right anterior chest wall Port-A-Cath is present with tip projecting over the superior vena cava. Stable cardiac and mediastinal contours. Interval increase in consolidation  within the right mid lower lung. No pleural effusion or pneumothorax. IMPRESSION: Increasing right mid and lower lung consolidation which may represent pneumonia in the appropriate clinical setting. Followup PA and lateral chest X-ray is recommended in 3-4 weeks following trial of antibiotic therapy to ensure resolution and exclude underlying malignancy. Electronically Signed   By: Lovey Newcomer M.D.   On: 01/19/2019 18:14   Dg Shoulder Left  Result Date: 01/23/2019 CLINICAL DATA:  Left shoulder pain for 2 weeks.  No injury. EXAM: LEFT SHOULDER - 2+ VIEW COMPARISON:  None. FINDINGS: No fracture.  No bone lesion. Glenohumeral joint normally spaced and aligned. No arthropathic changes. Mild AC joint osteoarthritis. Soft tissues are unremarkable. IMPRESSION: 1. No fracture or bone lesion.  No acute finding. 2. Mild AC joint osteoarthritis. Electronically Signed   By: Lajean Manes M.D.   On: 01/23/2019 19:24   Dg Hand Complete Left  Result Date: 01/19/2019 CLINICAL DATA:  Left hand edema. Suspected sepsis. No reported injury. EXAM: LEFT HAND - COMPLETE 3+ VIEW COMPARISON:  None. FINDINGS: Peripheral intravenous catheter is noted in the dorsal left wrist soft tissues. No fracture or dislocation. No suspicious focal osseous lesions. No significant arthropathy. No osseous erosions or periosteal reaction. IMPRESSION: No acute osseous abnormality. Electronically Signed   By: Ilona Sorrel M.D.   On: 01/19/2019 18:15     Assessment and plan Patient is a 63 y.o. female with multiple comorbidities  including coronary artery disease, CHF, history of previous CVA, GERD, hypertension, hyperlipidemia, mitral valve prolapse,  obstructive sleep apnea, Stage IV  high-grade ovarian serous serous carcinoma presents for follow up and discussion of management for ovarian cancer.   1. 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 Goals of care, counseling/discussion    # Stage IV high-grade ovarian serous serous carcinoma Labs reviewed and discussed with patient and husband. CA125 is elevated, slightly lower compared to CA1 25 on 101. CT chest abdomen pelvis images were independently reviewed by me and discussed Since last visit, patient has improved clinically.  She was also  evaluated by [facility] oncology Dr. Theora Gianotti. I had a lengthy discussion with patient and her husband regarding management options. Since she has improved clinically, it is reasonable to consider single agent carboplatin AUC 4 x 6 treatments.  If she tolerates well, weekly paclitaxel may be added Goal of care was discussed.  Patient is aware that her condition is likely not curable and intent of chemotherapy treatment is palliative, hopefully improve her survival, prolonged the time she stays in remission, palliate cancer related symptoms. She will meet palliative care Vonna [facility]yk [employer] in the next few weeks to discuss about CODE STATUS. Currently she remains as a full code.  Chemotherapy education was provided.  We had discussed the composition of chemotherapy regimen, length of chemo cycle, duration of treatment and the time to assess response to treatment.    I explained to the patient the risks and benefits of chemotherapy carboplatin including all but not limited to hair loss, mouth sore, nausea, vomiting, diarrhea, low blood counts, bleeding, neuropathy and risk of life threatening infection and even death, secondary malignancy etc.  . Patient and husband voiced understanding and willing to proceed chemotherapy.   # Chemotherapy education; Antiemetics-Zofran and Compazine; EMLA cream sent to pharmacy #Patient has Mediport which was placed at [facility].  Supportive care measures are  necessary for patient well-being and will be provided as necessary. We spent sufficient time to discuss many aspect of care, questions were  answered to patient's satisfaction.  Recommend patient to finish current course of antibiotics, check labs, cbc cmp, CA125.   # Normocytic anemia, hemoglobin 9.9 She has low borderline vitamin B12 level at 248, folate level 9.7.  Iron panel not consistent with iron deficiency.  Likely anemia secondary to chronic disease.  Continue to monitor hemoglobin panel.  Follow-up in 1 week for evaluation prior to the start of carboplatin AUC 4 treatment.  Orders Placed This Encounter  Procedures   CBC with Differential    Standing Status:   Standing    Number of Occurrences:   20    Standing Expiration Date:   02/07/2020   Comprehensive metabolic panel    Standing Status:   Standing    Number of Occurrences:   20    Standing Expiration Date:   02/07/2020     We spent sufficient time to discuss many aspect of care, questions were answered to patient's satisfaction. Total face to face encounter time for this patient visit was 40 min. >50% of the time was  spent in counseling and coordination of care.     Earlie Server, MD, PhD Hematology Oncology [facility] Pager- [phone] 02/07/2019

## 2019-02-08 ENCOUNTER — Other Ambulatory Visit: Payer: Medicaid Other | Admitting: Primary Care

## 2019-02-08 ENCOUNTER — Other Ambulatory Visit: Payer: Self-pay

## 2019-02-08 DIAGNOSIS — Z515 Encounter for palliative care: Secondary | ICD-10-CM

## 2019-02-08 NOTE — Progress Notes (Signed)
[facility] [facility] Palliative Care Consult Note Telephone: [phone]  Fax: [phone]  TELEHEALTH VISIT STATEMENT Due to the COVID-19 crisis, this visit was done via telemedicine from my office. It was initiated and consented to by this patient and/or family.  PATIENT NAME: Destiny [name] DOB: Feb 01, 1956 MRN: [id number]  PRIMARY CARE PROVIDER:   [name], [place]  REFERRING PROVIDER:  [name], MD [address] [place],  [place] [address] [phone]  RESPONSIBLE PARTY:   Extended Emergency Contact Information Primary Emergency Contact: Liliahna, Cudd Address: [address]          [place], [place] [address] Johnnette Litter of [place] Phone: [phone] Relation: Spouse Secondary Emergency Contact: Colin Rhein [place] of [place] Mobile Phone: [phone] Relation: Son  Palliative Care was asked to follow this patient by consultation request of [name], MD. This is the initial assessment.  ASSESSMENT AND RECOMMENDATIONS:   1. Goals of Care: Maximize quality of life and symptom management.  2. Symptom Management:   Nutrition: Eats 2 meals a day, does not like boost milk based. Albumin 3.8.  Constipation: Moving bowels ok per patient but husband states constipated.  Husband takes polyglycol 3350 half dose in a drink and this works well.   UTI: Has had them historically and recently Rx for one. Became drowsy and husband took her to ED. She usually gets a diflucan in addition to antibiotic b/c she gets yeast infections. She sometimes eats greek yogurt.   Pain: Does not know if she is taking oxycodone. Husband states this is current. She tries to avoid pain meds. Encouraged to treat pain. States some upper arm pain. It does not seem to be well controlled. She is taking oxycodone and tylenor. I will continue to assess this to optimize pain control and quality of life.  Caregivering: Lives with husband and  grandson, but is going home soon. Has home aide coming in to assistance for PCS.  Goals of Care: Will begin chemo again next week. They said they'd continue it it does not cause nausea I will make a home visit in a few weeks and discuss goals of care in light of cancer dx and treatments.  3. Family /Caregiver/Community Supports: Lives with husband and grandson. These are her caregivers. Yolanda Bonine is going back home tomorrow but lives locally and his father is patient's son, also locally. Husband is also looking for an apartment more appropriate for handicap access.  4. Cognitive / Functional decline: Patient spoke with me for a while but is a poor historian. Discussed history with husband. He states she had a UTI last week and went to ED last week for this. Last night she Slid out of bed on to floor and EMT came and got her up.    5. Advanced Care Directive: No living will  Health care POA but has Durable POA, husband states. Also states son can make decisions if he cannot, son is named  Adlee Paar.  Will continue conversation and introduce MOST at a home visit. Visit today was via telephone and in person would be preferable to assess more fully.   6. Follow up Palliative Care Visit: Palliative care will contin ue to follow for goals of care clarification and symptom management. Return 3 weeks or prn.  I spent 45 minutes providing this consultation,  from 1600 to 1645. More than 50% of the time in this consultation was spent coordinating communication.   HISTORY OF PRESENT ILLNESS:  Destiny [name] is a 63 y.o. year old female with multiple medical problems including stroke with a left-sided weakness, CAD, CHF, hypertension, hyperlipidemia, ovarian cancer status post chemotherapy. Palliative Care was asked to help address goals of care.   CODE STATUS: TBD  PPS: 40% HOSPICE ELIGIBILITY/DIAGNOSIS: TBD  PAST MEDICAL HISTORY:  Past Medical History:  Diagnosis Date  . CAD (coronary artery  disease)   . CHF (congestive heart failure) ([place])   . CVA (cerebral infarction)   . Female bladder prolapse   . GERD (gastroesophageal reflux disease)   . Hyperlipidemia   . Hypertension   . Hypokalemia   . Mitral valve disease   . Ovarian ca ([place])   . Sleep apnea   . Stroke [facility])    x3    SOCIAL HX:  Social History   Tobacco Use  . Smoking status: Never Smoker  . Smokeless tobacco: Never Used  Substance Use Topics  . Alcohol use: No    Alcohol/week: 0.0 standard drinks    ALLERGIES:  Allergies  Allergen Reactions  . Atorvastatin Hives    No s/sx of anaphylaxis, just intermittent hives.  Does not seem to be a class effect as she has tolerated lovastatin previously No s/sx of anaphylaxis, just intermittent hives.  Does not seem to be a class effect as she has tolerated lovastatin previously  . Fluconazole Rash    Reaction to generic but not name brand Reaction to generic but not name brand CAN USE DIFLUCAN.DOES NOT BREAK OUT WITH DIFLUCAN.(allergic only to generic brand)  . Sertraline Itching  . Sulfa Antibiotics Rash and Other (See Comments)    "hot " sensation  . Sulfamethoxazole-Trimethoprim Rash and Other (See Comments)    "felt hot" Other reaction(s): Other (See Comments) "felt hot"  . Sulfasalazine Other (See Comments) and Rash    "hot " sensation     PERTINENT MEDICATIONS:  Outpatient Encounter Medications as of 02/08/2019  Medication Sig  . acetaminophen (TYLENOL) 325 MG tablet Take 1-2 tablets (325-650 mg total) by mouth every 4 (four) hours as needed for mild pain.  Marland Kitchen amoxicillin-clavulanate (AUGMENTIN) 875-125 MG tablet Take 1 tablet by mouth every 12 (twelve) hours. (Patient not taking: Reported on 02/06/2019)  . aspirin 81 MG chewable tablet Chew 81 mg by mouth daily.  . cephALEXin (KEFLEX) 500 MG capsule Take 1 capsule (500 mg total) by mouth 3 (three) times daily for 7 days.  . Cholecalciferol (VITAMIN D3) 125 MCG (5000 UT) CAPS Take 1 capsule (5,000  Units total) by mouth daily.  . clonazePAM (KLONOPIN) 0.5 MG tablet Take 1 tablet (0.5 mg total) by mouth at bedtime as needed for anxiety.  . clopidogrel (PLAVIX) 75 MG tablet Take 1 tablet (75 mg total) by mouth daily.  Mariane Baumgarten Calcium (STOOL SOFTENER PO) Take 100 mg by mouth daily.   Marland Kitchen doxycycline (VIBRA-TABS) 100 MG tablet Take 1 tablet (100 mg total) by mouth every 12 (twelve) hours. (Patient not taking: Reported on 02/06/2019)  . famotidine (PEPCID) 20 MG tablet Take 1 tablet (20 mg total) by mouth 2 (two) times daily.  . fluconazole (DIFLUCAN) 150 MG tablet Take 1 tablet (150 mg total) by mouth daily.  Marland Kitchen lidocaine-prilocaine (EMLA) cream Apply to affected area once  . Melatonin 3 MG TABS Take 3 mg by mouth at bedtime.  . nitroGLYCERIN (NITROSTAT) 0.4 MG SL tablet Place 0.4 mg under the tongue every 5 (five) minutes as needed for chest pain.  Marland Kitchen ondansetron (ZOFRAN) 4 MG tablet  Take 4 mg by mouth every 8 (eight) hours as needed for nausea or vomiting.  Marland Kitchen oxybutynin (DITROPAN) 5 MG tablet Take 1 tablet (5 mg total) by mouth 2 (two) times daily.  Marland Kitchen oxyCODONE (OXY IR/ROXICODONE) 5 MG immediate release tablet Take 1-2 tablets (5-10 mg total) by mouth every 6 (six) hours as needed for severe pain.  Marland Kitchen PARoxetine (PAXIL) 30 MG tablet Take 1 tablet (30 mg total) by mouth daily.  . pregabalin (LYRICA) 150 MG capsule Take 1 capsule (150 mg total) by mouth at bedtime.  . pregabalin (LYRICA) 75 MG capsule Take 1 capsule (75 mg total) by mouth 2 (two) times daily.  . prochlorperazine (COMPAZINE) 10 MG tablet Take 1 tablet (10 mg total) by mouth every 6 (six) hours as needed (Nausea or vomiting).  . rosuvastatin (CRESTOR) 20 MG tablet Take 1 tablet (20 mg total) by mouth at bedtime.  Marland Kitchen spironolactone (ALDACTONE) 25 MG tablet Take 0.5 tablets (12.5 mg total) by mouth daily.  Marland Kitchen venlafaxine XR (EFFEXOR-XR) 75 MG 24 hr capsule Take 75 mg by mouth daily with breakfast.   No facility-administered encounter  medications on file as of 02/08/2019.     PHYSICAL EXAM/ROS:   Current and past weights:unavailable General: NAD, frail  Cardiovascular: no chest pain reported, Pulmonary: no cough, no increased SOB Abdomen: appetite good  endorses constipation GU: denies dysuria, incontinent of urine, frequent UTI MSK:  Pain in UE Skin: no rashes or wounds reported by family  Neurological: Weakness, states CVA and pain in arm  Estevan Oaks DNP AGPCNP-BC

## 2019-02-12 ENCOUNTER — Other Ambulatory Visit: Payer: Self-pay

## 2019-02-12 ENCOUNTER — Inpatient Hospital Stay: Payer: Medicaid Other

## 2019-02-15 ENCOUNTER — Inpatient Hospital Stay: Payer: Medicaid Other | Admitting: Hospice and Palliative Medicine

## 2019-02-15 ENCOUNTER — Inpatient Hospital Stay: Payer: Medicaid Other

## 2019-02-15 ENCOUNTER — Inpatient Hospital Stay (HOSPITAL_BASED_OUTPATIENT_CLINIC_OR_DEPARTMENT_OTHER): Payer: Medicaid Other | Admitting: Oncology

## 2019-02-15 ENCOUNTER — Other Ambulatory Visit: Payer: Self-pay

## 2019-02-15 ENCOUNTER — Inpatient Hospital Stay (HOSPITAL_BASED_OUTPATIENT_CLINIC_OR_DEPARTMENT_OTHER): Payer: Medicaid Other | Admitting: Hospice and Palliative Medicine

## 2019-02-15 ENCOUNTER — Encounter: Payer: Self-pay | Admitting: Oncology

## 2019-02-15 VITALS — BP 141/101 | HR 99 | Temp 98.1°F | Ht 62.0 in | Wt 184.0 lb

## 2019-02-15 DIAGNOSIS — E876 Hypokalemia: Secondary | ICD-10-CM | POA: Diagnosis not present

## 2019-02-15 DIAGNOSIS — Z7189 Other specified counseling: Secondary | ICD-10-CM

## 2019-02-15 DIAGNOSIS — Z515 Encounter for palliative care: Secondary | ICD-10-CM | POA: Diagnosis not present

## 2019-02-15 DIAGNOSIS — C763 Malignant neoplasm of pelvis: Secondary | ICD-10-CM

## 2019-02-15 DIAGNOSIS — R531 Weakness: Secondary | ICD-10-CM

## 2019-02-15 DIAGNOSIS — C569 Malignant neoplasm of unspecified ovary: Secondary | ICD-10-CM | POA: Diagnosis not present

## 2019-02-15 DIAGNOSIS — D649 Anemia, unspecified: Secondary | ICD-10-CM | POA: Diagnosis not present

## 2019-02-15 DIAGNOSIS — Z95828 Presence of other vascular implants and grafts: Secondary | ICD-10-CM

## 2019-02-15 DIAGNOSIS — Z5189 Encounter for other specified aftercare: Secondary | ICD-10-CM

## 2019-02-15 DIAGNOSIS — H9190 Unspecified hearing loss, unspecified ear: Secondary | ICD-10-CM

## 2019-02-15 DIAGNOSIS — Z5111 Encounter for antineoplastic chemotherapy: Secondary | ICD-10-CM

## 2019-02-15 DIAGNOSIS — R5383 Other fatigue: Secondary | ICD-10-CM

## 2019-02-15 LAB — COMPREHENSIVE METABOLIC PANEL
ALT: 17 U/L (ref 0–44)
AST: 25 U/L (ref 15–41)
Albumin: 3.9 g/dL (ref 3.5–5.0)
Alkaline Phosphatase: 65 U/L (ref 38–126)
Anion gap: 10 (ref 5–15)
BUN: 15 mg/dL (ref 8–23)
CO2: 24 mmol/L (ref 22–32)
Calcium: 9.5 mg/dL (ref 8.9–10.3)
Chloride: 105 mmol/L (ref 98–111)
Creatinine, Ser: 0.96 mg/dL (ref 0.44–1.00)
GFR calc Af Amer: >60 mL/min (ref >60)
GFR calc non Af Amer: >60 mL/min (ref >60)
Glucose, Bld: 111 mg/dL — ABNORMAL HIGH (ref 70–99)
Potassium: 3.3 mmol/L — ABNORMAL LOW (ref 3.5–5.1)
Sodium: 139 mmol/L (ref 135–145)
Total Bilirubin: 0.4 mg/dL (ref 0.3–1.2)
Total Protein: 7.6 g/dL (ref 6.5–8.1)

## 2019-02-15 LAB — CBC WITH DIFFERENTIAL/PLATELET
Abs Immature Granulocytes: 0.03 10*3/uL (ref 0.00–0.07)
Basophils Absolute: 0 10*3/uL (ref 0.0–0.1)
Basophils Relative: 1 %
Eosinophils Absolute: 0.4 10*3/uL (ref 0.0–0.5)
Eosinophils Relative: 6 %
HCT: 31.3 % — ABNORMAL LOW (ref 36.0–46.0)
Hemoglobin: 10.1 g/dL — ABNORMAL LOW (ref 12.0–15.0)
Immature Granulocytes: 1 %
Lymphocytes Relative: 22 %
Lymphs Abs: 1.4 10*3/uL (ref 0.7–4.0)
MCH: 31 pg (ref 26.0–34.0)
MCHC: 32.3 g/dL (ref 30.0–36.0)
MCV: 96 fL (ref 80.0–100.0)
Monocytes Absolute: 0.7 10*3/uL (ref 0.1–1.0)
Monocytes Relative: 11 %
Neutro Abs: 3.6 10*3/uL (ref 1.7–7.7)
Neutrophils Relative %: 59 %
Platelets: 175 10*3/uL (ref 150–400)
RBC: 3.26 MIL/uL — ABNORMAL LOW (ref 3.87–5.11)
RDW: 12.9 % (ref 11.5–15.5)
WBC: 6.1 10*3/uL (ref 4.0–10.5)
nRBC: 0 % (ref 0.0–0.2)

## 2019-02-15 MED ORDER — SODIUM CHLORIDE 0.9% FLUSH
10.0000 mL | Freq: Once | INTRAVENOUS | Status: AC
  Administered 2019-02-15: 10 mL via INTRAVENOUS
  Filled 2019-02-15: qty 10

## 2019-02-15 MED ORDER — DIPHENHYDRAMINE HCL 50 MG/ML IJ SOLN: 50.0000 mg | Freq: Once | INTRAMUSCULAR | Status: DC

## 2019-02-15 MED ORDER — MUPIROCIN 2 % EX OINT: TOPICAL_OINTMENT | Freq: Every day | CUTANEOUS | 0 refills | Status: AC

## 2019-02-15 MED ORDER — SODIUM CHLORIDE 0.9 % IV SOLN
Freq: Once | INTRAVENOUS | Status: AC
  Administered 2019-02-15: 10:00:00 via INTRAVENOUS
  Filled 2019-02-15: qty 250

## 2019-02-15 MED ORDER — PALONOSETRON HCL INJECTION 0.25 MG/5ML
0.2500 mg | Freq: Once | INTRAVENOUS | Status: AC
  Administered 2019-02-15: 0.25 mg via INTRAVENOUS
  Filled 2019-02-15: qty 5

## 2019-02-15 MED ORDER — SODIUM CHLORIDE 0.9 % IV SOLN
10.0000 meq | Freq: Once | INTRAVENOUS | Status: AC
  Administered 2019-02-15: 11:00:00 10 meq via INTRAVENOUS
  Filled 2019-02-15: qty 5

## 2019-02-15 MED ORDER — SODIUM CHLORIDE 0.9 % IV SOLN
Freq: Once | INTRAVENOUS | Status: AC
  Administered 2019-02-15: 12:00:00 via INTRAVENOUS
  Filled 2019-02-15: qty 5

## 2019-02-15 MED ORDER — SODIUM CHLORIDE 0.9 % IV SOLN
420.0000 mg | Freq: Once | INTRAVENOUS | Status: AC
  Administered 2019-02-15: 420 mg via INTRAVENOUS
  Filled 2019-02-15: qty 42

## 2019-02-15 MED ORDER — HEPARIN SOD (PORK) LOCK FLUSH 100 UNIT/ML IV SOLN
500.0000 [IU] | Freq: Once | INTRAVENOUS | Status: AC | PRN
  Administered 2019-02-15: 13:00:00 500 [IU]
  Filled 2019-02-15: qty 5

## 2019-02-15 MED ORDER — FAMOTIDINE IN NACL 20-0.9 MG/50ML-% IV SOLN: 20.0000 mg | Freq: Once | INTRAVENOUS | Status: DC

## 2019-02-15 NOTE — Progress Notes (Signed)
[place]  [phone] [phone]  Patient Care Team: [name], MD as PCP - General (Family Medicine)   Name of the patient: Destiny [name]  [id number]  1955-10-12   Date of Visit: 02/15/19  Diagnosis: stage IV high-grade ovarian serous carcinoma   Current Treatment: Carboplatin  Reason for Visit: This patient is a 63 y.o. female who presents to chemo care clinic today for initial meeting in preparation for starting chemotherapy. I introduced the chemo care clinic and we discussed that the role of the clinic is to assist those who are at an increased risk of emergency room visits and/or complications during the course of chemotherapy treatment. We discussed that the increased risk takes into account factors such as age, performance status, and co-morbidities. We also discussed that for some, this might include barriers to care such as not having a primary care provider, lack of insurance/transportation, or not being able to afford medications. We discussed that the goal of the program is to help prevent unplanned ER visits and help reduce complications during chemotherapy. We do this by discussing specific risk factors to each individual and identifying ways that we can help improve these risk factors and reduce barriers to care.   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       Allergies  Allergen Reactions  . Atorvastatin Hives    No s/sx of anaphylaxis, just intermittent hives.  Does not seem to be a class effect as she has tolerated lovastatin previously No s/sx of anaphylaxis, just intermittent hives.  Does not seem to be a class effect as she has tolerated lovastatin previously  . Fluconazole Rash    Reaction to generic but not name brand Reaction to generic but not name brand CAN USE DIFLUCAN.DOES NOT BREAK OUT WITH DIFLUCAN.(allergic only to generic brand)  . Sertraline Itching  . Sulfa Antibiotics Rash and Other (See Comments)    "hot " sensation  . Sulfamethoxazole-Trimethoprim Rash and Other (See Comments)    "felt hot" Other reaction(s): Other (See Comments) "felt hot"  . Sulfasalazine Other (See Comments) and Rash    "hot " sensation     Past Medical History:  Diagnosis Date  . CAD (coronary artery disease)   . CHF (congestive heart failure) ([place])   . CVA (cerebral infarction)   . Female bladder prolapse   . GERD (gastroesophageal reflux disease)   . Hyperlipidemia   . Hypertension   . Hypokalemia   . Mitral valve disease   . Ovarian ca ([place])   . Sleep apnea   . Stroke [facility])    x3     Past Surgical History:  Procedure Laterality Date  . CARPAL TUNNEL RELEASE Bilateral   . CATARACT EXTRACTION Right 11/22/2017  . CHOLECYSTECTOMY    . COLON SURGERY    .  TENNIS ELBOW RELEASE/NIRSCHEL PROCEDURE Left   . TONSILLECTOMY      Social History   Socioeconomic History  . Marital status: Married    Spouse name: Not on file  . Number of children: Not on file  . Years of education: Not on file  . Highest education level: Not on file  Occupational History  . Not on file  Social Needs  . Financial resource strain: Patient refused  . Food insecurity    Worry: Never true    Inability: Never true  . Transportation needs    Medical: No    Non-medical:  No  Tobacco Use  . Smoking status: Never Smoker  . Smokeless tobacco: Never Used  Substance and Sexual Activity  . Alcohol use: No    Alcohol/week: 0.0 standard drinks  . Drug use: No  . Sexual activity: Not Currently  Lifestyle  . Physical activity    Days per week: 0 days    Minutes per session: 0 min  . Stress: Only a little  Relationships  . Social Herbalist on phone: Twice a week    Gets together: Once a week    Attends religious service: 1 to 4 times per year    Active member of club or organization: No    Attends meetings of clubs or organizations: Never    Relationship status: Married  . Intimate partner violence    Fear of current or ex partner: No    Emotionally abused: No    Physically abused: No    Forced sexual activity: No  Other Topics Concern  . Not on file  Social History Narrative   Recently released from Rehab, Lives home with husband    Family History  Problem Relation Age of Onset  . Heart disease Other   . Hypertension Other   . Rheum arthritis Mother   . CAD Father     Current Outpatient Medications  Medication Sig Dispense Refill  . acetaminophen (TYLENOL) 325 MG tablet Take 1-2 tablets (325-650 mg total) by mouth every 4 (four) hours as needed for mild pain.    Marland Kitchen amoxicillin-clavulanate (AUGMENTIN) 875-125 MG tablet Take 1 tablet by mouth every 12 (twelve) hours. 7 tablet 0  . aspirin 81 MG chewable tablet Chew 81 mg by mouth daily.    . Cholecalciferol (VITAMIN D3) 125 MCG (5000 UT) CAPS Take 1 capsule (5,000 Units total) by mouth daily. 30 capsule 0  . clonazePAM (KLONOPIN) 0.5 MG tablet Take 1 tablet (0.5 mg total) by mouth at bedtime as needed for anxiety. 20 tablet 0  . clopidogrel (PLAVIX) 75 MG tablet Take 1 tablet (75 mg total) by mouth daily. 30 tablet 1  . Docusate Calcium (STOOL SOFTENER PO) Take 100 mg by mouth daily.     Marland Kitchen doxycycline (VIBRA-TABS) 100 MG tablet Take 1 tablet (100 mg total) by mouth every 12 (twelve)  hours. 7 tablet 0  . famotidine (PEPCID) 20 MG tablet Take 1 tablet (20 mg total) by mouth 2 (two) times daily. 60 tablet 0  . fluconazole (DIFLUCAN) 150 MG tablet Take 1 tablet (150 mg total) by mouth daily. 1 tablet 0  . lidocaine-prilocaine (EMLA) cream Apply to affected area once 30 g 3  . Melatonin 3 MG TABS Take 3 mg by mouth at bedtime.    . mometasone-formoterol (DULERA) 100-5 MCG/ACT AERO Inhale 2 puffs into the lungs 2 times daily at 12 noon and 4 pm.    . mupirocin ointment (  BACTROBAN) 2 % Apply topically daily. 15 g 0  . nitroGLYCERIN (NITROSTAT) 0.4 MG SL tablet Place 0.4 mg under the tongue every 5 (five) minutes as needed for chest pain.    Marland Kitchen ondansetron (ZOFRAN) 4 MG tablet Take 4 mg by mouth every 8 (eight) hours as needed for nausea or vomiting.    Marland Kitchen oxybutynin (DITROPAN) 5 MG tablet Take 1 tablet (5 mg total) by mouth 2 (two) times daily. 60 tablet 0  . oxyCODONE (OXY IR/ROXICODONE) 5 MG immediate release tablet Take 1-2 tablets (5-10 mg total) by mouth every 6 (six) hours as needed for severe pain. 60 tablet 0  . PARoxetine (PAXIL) 30 MG tablet Take 1 tablet (30 mg total) by mouth daily. 30 tablet 0  . pregabalin (LYRICA) 150 MG capsule Take 1 capsule (150 mg total) by mouth at bedtime. 30 capsule 0  . pregabalin (LYRICA) 75 MG capsule Take 1 capsule (75 mg total) by mouth 2 (two) times daily. 60 capsule 0  . prochlorperazine (COMPAZINE) 10 MG tablet Take 1 tablet (10 mg total) by mouth every 6 (six) hours as needed (Nausea or vomiting). 30 tablet 1  . rosuvastatin (CRESTOR) 20 MG tablet Take 1 tablet (20 mg total) by mouth at bedtime. 30 tablet 0  . spironolactone (ALDACTONE) 25 MG tablet Take 0.5 tablets (12.5 mg total) by mouth daily. 30 tablet 0  . venlafaxine XR (EFFEXOR-XR) 75 MG 24 hr capsule Take 75 mg by mouth daily with breakfast.     No current facility-administered medications for this visit.    Facility-Administered Medications Ordered in Other Visits   Medication Dose Route Frequency Provider Last Rate Last Dose  . CARBOplatin (PARAPLATIN) 420 mg in sodium chloride 0.9 % 250 mL chemo infusion  420 mg Intravenous Once Earlie Server, MD      . fosaprepitant (EMEND) 150 mg, dexamethasone (DECADRON) 12 mg in sodium chloride 0.9 % 145 mL IVPB   Intravenous Once Earlie Server, MD 454 mL/hr at 02/15/19 1150    . heparin lock flush 100 unit/mL  500 Units Intracatheter Once PRN Earlie Server, MD         PERFORMANCE STATUS (ECOG) : 3 - Symptomatic, >50% confined to bed  Review of Systems As noted above. Otherwise, a complete review of systems is negative.  Physical Exam General: NAD, frail appearing, thin Pulmonary: Unlabored Extremities: no edema, no joint deformities Skin: no rashes Neurological: Weakness but otherwise nonfocal   Assessment and Plan:    1. Cancer: stage IV high-grade ovarian serous carcinoma status post BSO (12/23/6718) complicated by bowel perforation requiring small bowel resection.  Now on active treatment carboplatin.  2. High Risk for ER/Hospitalization during Chemotherapy: We discussed the role of the chemo care clinic and identified patient specific risk factors. We also discussed the role of the Symptom Management and Palliative Care Clinics at [facility] and methods of contacting clinic/provider. She denies needing specific assistance at this time.  Current PCP: [name], MD  Hospital Admissions: 8   ED Visits: 13   Has Medicaid: Yes  Has Medicare: No  In relationship: Yes  Has Anemia: Yes  Has asthma: Yes   Has atrial fibrillation: No  Has CVD: Yes  Has chronic kidney disease: No  Has Chronic Obstructive Pulmonary Disease: No  Has Congestive Heart Failure: Yes  Has Connective Tissue Disorder: No  Has Depression: Yes  Has Diabetes: No  Has liver disease: No  Has Peripheral Vascular Disease: No    3. Social Determinants  of Health:   Housing - Lives at home with husband.   Food - Denies concerns  Transportation -  Denies concerns  Utilities - Denies concerns  Safety -  Denies concerns  Financial Strain - Denies concerns  Employment - Not currently employed  Family/Community Support - Patient is married and lives at home with her husband.  She has a son and daughter, both of whom are involved in her care.  She previously worked as a Quarry manager.  Physical Activity -  Patient is weak at baseline and ambulates with assistance. She has home health following. Patient has severe shoulder pain in L. Arm and has been taking oxycodone. She says the oxycodone helps but is only taking it Q8H prn. Discussed increasing to Q6H prn.    Patient expressed understanding and was in agreement with this plan. She also understands that She can call clinic at any time with any questions, concerns, or complaints.   A total of (15) minutes of face-to-face time was spent with this patient with greater than 50% of that time in counseling and care-coordination.   Signed by: Altha Harm, PhD, DNP, NP-C, [facility] [phone] (Work Cell)

## 2019-02-15 NOTE — Progress Notes (Signed)
Patient stated that she had been doing well with no complaints. 

## 2019-02-16 DIAGNOSIS — Z95828 Presence of other vascular implants and grafts: Secondary | ICD-10-CM | POA: Insufficient documentation

## 2019-02-16 NOTE — Progress Notes (Signed)
Hematology/Oncology Follow Up Note [facility]  Telephone:([phone] [phone]  Patient Care Team: Destiny Lank, MD as PCP - General (Family Medicine)   Name of the patient: Destiny [name]  [id number]  1955/12/27   REASON FOR VISIT Follow up for management of stage IV high-grade serous serous carcinoma    HPI  Destiny [name] is a 63 y.o.afemale who has above oncology history reviewed by me today presented for follow up visit for management of ovarian cancer.  I first met patient on 11/05/2018, during the admission after she developed acute stroke after received chemotherapy.  Her oncology history listed as followed.  # stage IV high-grade serous serous carcinoma- [ preop CA125 1170] who underwent bilateral salpingo-oophorectomy 03/18/6944 complicated by bowel perforation status post exploratory laparotomy, small bowel resection, and reanastomosis, debridement 08/31/2018, [post op CA 125 251].  Her oncology care was at [facility]. Per Dr.Soper, last chemotherapy was on 10/23/2018, s/p Carboplatin AUC 5.   # 11/02/2018  Patient was found to have hypotension, hypokalemia also pneumonia/meeting sepsis criteria.  Admitted  # 11/04/2018 left arm numbness and significantly weaker strength of left arm and left leg, mild dysarthria and facial droop. CT head was performed which showed no hemorrhagic changes and a CTA shows no LVO MRI of brain 11/04/2018 showed small acute infarcts right corona [employer], subcortical right parietal lobe, left cerebellum. Due to chemotherapy induced thrombocytopenia, antiplatelet was delayed.   #Echocardiogram shows no cardiac source of emboli with an EF of60-65% # 10/16/2018 CT abdomen pelvis with contrast   showed a small outpouching from the small bowel anastomotic site, small to moderate right-sided pleural effusion, some nodularity along left lateral costophrenic angle.  Right sided pleural effusion was also noted on CT chest  wo contrast on 11/01/2018, with underlying atelectasis/infiltrate.  Patient was discharged to Rehab. Patient has not been able to follow up due to readmission.   # 12/07/2018 readmitted due to acute encephalopathy, acute cystitis, HCAP.  # Readmitted again on 01/19/2019, due to CAP, Sepsis.  Discharged on 01/21/2019 Patient and her husband prefer to transfer her oncology care to [facility] and will also want to establish care with [place] oncology.   INTERVAL HISTORY Destiny [name] is a 63 y.o. female who has above history reviewed by me today presents for follow up visit for evaluation prior to chemotherapy for stage IV high-grade ovarian serous carcinoma Problems and complaints are listed below: Patient reports no new complaints. Husband reports that patient had picked on her midline scar and now has an small area of open wound. Denies any cough, nausea, vomiting, diarrhea, abdominal pain, fever or chills. Appetite is fair.  Review of Systems  Constitutional: Positive for fatigue. Negative for appetite change, chills, fever and unexpected weight change.  HENT:   Positive for hearing loss. Negative for voice change.        She wears hearing aid today  Eyes: Negative for eye problems.  Respiratory: Negative for chest tightness, cough and shortness of breath.   Cardiovascular: Negative for chest pain.  Gastrointestinal: Negative for abdominal distention, abdominal pain and blood in stool.  Endocrine: Negative for hot flashes.  Genitourinary: Negative for difficulty urinating and frequency.   Musculoskeletal: Negative for arthralgias.  Skin: Negative for itching and rash.  Neurological: Positive for extremity weakness.       Chronic left side weakness  Hematological: Negative for adenopathy.  Psychiatric/Behavioral: Negative for confusion.      Allergies  Allergen Reactions  Atorvastatin Hives    No s/sx of anaphylaxis, just intermittent hives.  Does not seem to be a class effect as she  has tolerated lovastatin previously No s/sx of anaphylaxis, just intermittent hives.  Does not seem to be a class effect as she has tolerated lovastatin previously   Fluconazole Rash    Reaction to generic but not name brand Reaction to generic but not name brand CAN USE DIFLUCAN.DOES NOT BREAK OUT WITH DIFLUCAN.(allergic only to generic brand)   Sertraline Itching   Sulfa Antibiotics Rash and Other (See Comments)    "hot " sensation   Sulfamethoxazole-Trimethoprim Rash and Other (See Comments)    "felt hot" Other reaction(s): Other (See Comments) "felt hot"   Sulfasalazine Other (See Comments) and Rash    "hot " sensation     Past Medical History:  Diagnosis Date   CAD (coronary artery disease)    CHF (congestive heart failure) (HCC)    CVA (cerebral infarction)    Female bladder prolapse    GERD (gastroesophageal reflux disease)    Hyperlipidemia    Hypertension    Hypokalemia    Mitral valve disease    Ovarian ca ([place])    Sleep apnea    Stroke ([place])    x3     Past Surgical History:  Procedure Laterality Date   CARPAL TUNNEL RELEASE Bilateral    CATARACT EXTRACTION Right 11/22/2017   CHOLECYSTECTOMY     COLON SURGERY     TENNIS ELBOW RELEASE/NIRSCHEL PROCEDURE Left    TONSILLECTOMY      Social History   Socioeconomic History   Marital status: Married    Spouse name: Not on file   Number of children: Not on file   Years of education: Not on file   Highest education level: Not on file  Occupational History   Not on file  Social Needs   Financial resource strain: Patient refused   Food insecurity    Worry: Never true    Inability: Never true   Transportation needs    Medical: No    Non-medical: No  Tobacco Use   Smoking status: Never Smoker   Smokeless tobacco: Never Used  Substance and Sexual Activity   Alcohol use: No    Alcohol/week: 0.0 standard drinks   Drug use: No   Sexual activity: Not Currently    Lifestyle   Physical activity    Days per week: 0 days    Minutes per session: 0 min   Stress: Only a little  Relationships   Press photographer on phone: Twice a week    Gets together: Once a week    Attends religious service: 1 to 4 times per year    Active member of club or organization: No    Attends meetings of clubs or organizations: Never    Relationship status: Married   Intimate partner violence    Fear of current or ex partner: No    Emotionally abused: No    Physically abused: No    Forced sexual activity: No  Other Topics Concern   Not on file  Social History Narrative   Recently released from [place], Lives home with husband    Family History  Problem Relation Age of Onset   Heart disease Other    Hypertension Other    Rheum arthritis Mother    CAD Father      Current Outpatient Medications:    acetaminophen (TYLENOL) 325 MG tablet, Take 1-2  tablets (325-650 mg total) by mouth every 4 (four) hours as needed for mild pain., Disp: , Rfl:    amoxicillin-clavulanate (AUGMENTIN) 875-125 MG tablet, Take 1 tablet by mouth every 12 (twelve) hours., Disp: 7 tablet, Rfl: 0   aspirin 81 MG chewable tablet, Chew 81 mg by mouth daily., Disp: , Rfl:    Cholecalciferol (VITAMIN D3) 125 MCG (5000 UT) CAPS, Take 1 capsule (5,000 Units total) by mouth daily., Disp: 30 capsule, Rfl: 0   clonazePAM (KLONOPIN) 0.5 MG tablet, Take 1 tablet (0.5 mg total) by mouth at bedtime as needed for anxiety., Disp: 20 tablet, Rfl: 0   clopidogrel (PLAVIX) 75 MG tablet, Take 1 tablet (75 mg total) by mouth daily., Disp: 30 tablet, Rfl: 1   Docusate Calcium (STOOL SOFTENER PO), Take 100 mg by mouth daily. , Disp: , Rfl:    doxycycline (VIBRA-TABS) 100 MG tablet, Take 1 tablet (100 mg total) by mouth every 12 (twelve) hours., Disp: 7 tablet, Rfl: 0   famotidine (PEPCID) 20 MG tablet, Take 1 tablet (20 mg total) by mouth 2 (two) times daily., Disp: 60 tablet, Rfl: 0    fluconazole (DIFLUCAN) 150 MG tablet, Take 1 tablet (150 mg total) by mouth daily., Disp: 1 tablet, Rfl: 0   lidocaine-prilocaine (EMLA) cream, Apply to affected area once, Disp: 30 g, Rfl: 3   Melatonin 3 MG TABS, Take 3 mg by mouth at bedtime., Disp: , Rfl:    mometasone-formoterol (DULERA) 100-5 MCG/ACT AERO, Inhale 2 puffs into the lungs 2 times daily at 12 noon and 4 pm., Disp: , Rfl:    nitroGLYCERIN (NITROSTAT) 0.4 MG SL tablet, Place 0.4 mg under the tongue every 5 (five) minutes as needed for chest pain., Disp: , Rfl:    ondansetron (ZOFRAN) 4 MG tablet, Take 4 mg by mouth every 8 (eight) hours as needed for nausea or vomiting., Disp: , Rfl:    oxybutynin (DITROPAN) 5 MG tablet, Take 1 tablet (5 mg total) by mouth 2 (two) times daily., Disp: 60 tablet, Rfl: 0   oxyCODONE (OXY IR/ROXICODONE) 5 MG immediate release tablet, Take 1-2 tablets (5-10 mg total) by mouth every 6 (six) hours as needed for severe pain., Disp: 60 tablet, Rfl: 0   PARoxetine (PAXIL) 30 MG tablet, Take 1 tablet (30 mg total) by mouth daily., Disp: 30 tablet, Rfl: 0   pregabalin (LYRICA) 150 MG capsule, Take 1 capsule (150 mg total) by mouth at bedtime., Disp: 30 capsule, Rfl: 0   pregabalin (LYRICA) 75 MG capsule, Take 1 capsule (75 mg total) by mouth 2 (two) times daily., Disp: 60 capsule, Rfl: 0   prochlorperazine (COMPAZINE) 10 MG tablet, Take 1 tablet (10 mg total) by mouth every 6 (six) hours as needed (Nausea or vomiting)., Disp: 30 tablet, Rfl: 1   rosuvastatin (CRESTOR) 20 MG tablet, Take 1 tablet (20 mg total) by mouth at bedtime., Disp: 30 tablet, Rfl: 0   spironolactone (ALDACTONE) 25 MG tablet, Take 0.5 tablets (12.5 mg total) by mouth daily., Disp: 30 tablet, Rfl: 0   venlafaxine XR (EFFEXOR-XR) 75 MG 24 hr capsule, Take 75 mg by mouth daily with breakfast., Disp: , Rfl:    mupirocin ointment (BACTROBAN) 2 %, Apply topically daily., Disp: 15 g, Rfl: 0  Physical exam:  Today's Vitals    02/15/19 0908  BP: (!) 141/101  Pulse: 99  Temp: 98.1 F (36.7 C)  TempSrc: Tympanic  Weight: 184 lb (83.5 kg)  Height: 5' 2"  (1.575 m)  PainSc:  0-No pain   Body mass index is 33.65 kg/m. Physical Exam Constitutional:      General: She is not in acute distress.    Comments: Frail appearance, sitting in wheelchair.   HENT:     Head: Normocephalic and atraumatic.  Eyes:     General: No scleral icterus.    Pupils: Pupils are equal, round, and reactive to light.  Neck:     Musculoskeletal: Normal range of motion and neck supple.  Cardiovascular:     Rate and Rhythm: Normal rate and regular rhythm.     Heart sounds: Normal heart sounds.  Pulmonary:     Effort: Pulmonary effort is normal. No respiratory distress.     Breath sounds: No wheezing.  Abdominal:     General: Bowel sounds are normal. There is no distension.     Palpations: Abdomen is soft. There is no mass.     Tenderness: There is no abdominal tenderness.  Musculoskeletal: Normal range of motion.        General: No deformity.  Skin:    General: Skin is warm and dry.     Findings: No erythema or rash.     Comments: Abdomen mid-line scar, focal area of 0.5cm scab ripped off, with some exudate. No focal erythematous or swelling.   Neurological:     Mental Status: She is alert.     Cranial Nerves: No cranial nerve deficit.     Coordination: Coordination normal.     Comments: She wears hearing aid Left upper extremity 2/5,  Other extremities 3-4/5.    Psychiatric:        Behavior: Behavior normal.        Thought Content: Thought content normal.     CMP Latest Ref Rng & Units 02/15/2019  Glucose 70 - 99 mg/dL 111(H)  BUN 8 - 23 mg/dL 15  Creatinine 0.44 - 1.00 mg/dL 0.96  Sodium 135 - 145 mmol/L 139  Potassium 3.5 - 5.1 mmol/L 3.3(L)  Chloride 98 - 111 mmol/L 105  CO2 22 - 32 mmol/L 24  Calcium 8.9 - 10.3 mg/dL 9.5  Total Protein 6.5 - 8.1 g/dL 7.6  Total Bilirubin 0.3 - 1.2 mg/dL 0.4  Alkaline Phos 38 -  126 U/L 65  AST 15 - 41 U/L 25  ALT 0 - 44 U/L 17   CBC Latest Ref Rng & Units 02/15/2019  WBC 4.0 - 10.5 K/uL 6.1  Hemoglobin 12.0 - 15.0 g/dL 10.1(L)  Hematocrit 36.0 - 46.0 % 31.3(L)  Platelets 150 - 400 K/uL 175   RADIOGRAPHIC STUDIES: I have personally reviewed the radiological images as listed and agreed with the findings in the report.  Ct Chest W Contrast  Result Date: 01/31/2019 CLINICAL DATA:  History of ovarian carcinoma.  Follow-up exam. EXAM: CT CHEST, ABDOMEN, AND PELVIS WITH CONTRAST TECHNIQUE: Multidetector CT imaging of the chest, abdomen and pelvis was performed following the standard protocol during bolus administration of intravenous contrast. CONTRAST:  137m OMNIPAQUE IOHEXOL 300 MG/ML  SOLN COMPARISON:  CT abdomen pelvis 10/16/2018 FINDINGS: CT CHEST FINDINGS Cardiovascular: Right anterior chest wall Port-A-Cath is present with tip terminating in the superior vena cava. Normal heart size. Thoracic aortic vascular calcifications. Mediastinum/Nodes: No enlarged axillary, mediastinal or hilar lymphadenopathy. Normal appearance of the esophagus. Lungs/Pleura: Central airways are patent. Dependent atelectasis. Persistent small right pleural effusion. No pneumothorax. Musculoskeletal: Thoracic spine degenerative changes. No aggressive or acute appearing osseous lesions. Healing posterior left twelfth rib fracture with callus formation. CT ABDOMEN PELVIS  FINDINGS Hepatobiliary: The liver is normal in size and contour. Prior cholecystectomy. There is a new nonspecific linear low-attenuation structure within the right hepatic lobe (image 60; series 2). Pancreas: Mildly atrophic. Spleen: Unremarkable Adrenals/Urinary Tract: Normal adrenal glands. Kidneys enhance symmetrically with contrast. No hydronephrosis. Urinary bladder is unremarkable. Stomach/Bowel: Normal morphology of the stomach. No evidence for small bowel obstruction. No free fluid or free intraperitoneal air. Prior  omentectomy. Tiny nodules within the left upper quadrant (image 68; series 5) are stable. Vascular/Lymphatic: Normal caliber abdominal aorta. Peripheral calcified atherosclerotic plaque. No retroperitoneal lymphadenopathy. Similar-appearing 8 mm aortocaval node (image 60; series 2). Reproductive: Prior hysterectomy. Other: Postsurgical changes involving the anterior abdominal wall. There is tubular soft tissue density within the central pelvis (image 91; series 2), located posterior to the uterus. Musculoskeletal: Lumbar spine degenerative changes. No aggressive or acute appearing osseous lesions. IMPRESSION: 1. There is a new nonspecific low-attenuation structure within the right hepatic lobe which may represent focal dilated bile duct, potentially sequelae of hepatic lesion, potentially treated given patient's current history of chemotherapy or focal fat and/or fluid. Consider dedicated evaluation with pre and post contrast-enhanced MRI. 2. There is similar-appearing tubular soft tissue density lower abdomen, posterior to the uterus which may be postsurgical in etiology. Recommend continued attention on follow-up. 3. Similar-appearing tiny nodularity with left upper quadrant adjacent to the stomach. Recommend attention on follow-up. 4. Persistent small to moderate right pleural effusion. Electronically Signed   By: Lovey Newcomer M.D.   On: 01/31/2019 20:52   Ct Abdomen Pelvis W Contrast  Result Date: 01/31/2019 CLINICAL DATA:  History of ovarian carcinoma.  Follow-up exam. EXAM: CT CHEST, ABDOMEN, AND PELVIS WITH CONTRAST TECHNIQUE: Multidetector CT imaging of the chest, abdomen and pelvis was performed following the standard protocol during bolus administration of intravenous contrast. CONTRAST:  176m OMNIPAQUE IOHEXOL 300 MG/ML  SOLN COMPARISON:  CT abdomen pelvis 10/16/2018 FINDINGS: CT CHEST FINDINGS Cardiovascular: Right anterior chest wall Port-A-Cath is present with tip terminating in the superior vena  cava. Normal heart size. Thoracic aortic vascular calcifications. Mediastinum/Nodes: No enlarged axillary, mediastinal or hilar lymphadenopathy. Normal appearance of the esophagus. Lungs/Pleura: Central airways are patent. Dependent atelectasis. Persistent small right pleural effusion. No pneumothorax. Musculoskeletal: Thoracic spine degenerative changes. No aggressive or acute appearing osseous lesions. Healing posterior left twelfth rib fracture with callus formation. CT ABDOMEN PELVIS FINDINGS Hepatobiliary: The liver is normal in size and contour. Prior cholecystectomy. There is a new nonspecific linear low-attenuation structure within the right hepatic lobe (image 60; series 2). Pancreas: Mildly atrophic. Spleen: Unremarkable Adrenals/Urinary Tract: Normal adrenal glands. Kidneys enhance symmetrically with contrast. No hydronephrosis. Urinary bladder is unremarkable. Stomach/Bowel: Normal morphology of the stomach. No evidence for small bowel obstruction. No free fluid or free intraperitoneal air. Prior omentectomy. Tiny nodules within the left upper quadrant (image 68; series 5) are stable. Vascular/Lymphatic: Normal caliber abdominal aorta. Peripheral calcified atherosclerotic plaque. No retroperitoneal lymphadenopathy. Similar-appearing 8 mm aortocaval node (image 60; series 2). Reproductive: Prior hysterectomy. Other: Postsurgical changes involving the anterior abdominal wall. There is tubular soft tissue density within the central pelvis (image 91; series 2), located posterior to the uterus. Musculoskeletal: Lumbar spine degenerative changes. No aggressive or acute appearing osseous lesions. IMPRESSION: 1. There is a new nonspecific low-attenuation structure within the right hepatic lobe which may represent focal dilated bile duct, potentially sequelae of hepatic lesion, potentially treated given patient's current history of chemotherapy or focal fat and/or fluid. Consider dedicated evaluation with pre and  post contrast-enhanced MRI.  2. There is similar-appearing tubular soft tissue density lower abdomen, posterior to the uterus which may be postsurgical in etiology. Recommend continued attention on follow-up. 3. Similar-appearing tiny nodularity with left upper quadrant adjacent to the stomach. Recommend attention on follow-up. 4. Persistent small to moderate right pleural effusion. Electronically Signed   By: Lovey Newcomer M.D.   On: 01/31/2019 20:52   US Abdomen Limited  Result Date: 01/19/2019 CLINICAL DATA:  Bloody purulence drainage from surgical site for 2-3 months. EXAM: ULTRASOUND ABDOMEN LIMITED COMPARISON:  None. FINDINGS: There is linear hypoechogenicity in the region of the patient's scar. The finding is at least partially due to scarring. However, a small amount of tracking fluid is not excluded given history. There is hypervascularity in this region. IMPRESSION: 1. Linear hypo vascularity in the region of the patient's scar and drainage is identified. The findings are at least partially due to scarring. However, I do suspect there may be some fluid tracking through this region. No discrete abscess. Hypervascularity is noted. CT imaging could further evaluate for abscess if there is concern. Electronically Signed   By: Dorise Bullion III M.D   On: 01/19/2019 20:54   US Venous Img Upper Uni Left  Result Date: 01/19/2019 CLINICAL DATA:  Left upper extremity swelling. EXAM: LEFT UPPER EXTREMITY VENOUS DOPPLER ULTRASOUND TECHNIQUE: Gray-scale sonography with graded compression, as well as color Doppler and duplex ultrasound were performed to evaluate the upper extremity deep venous system from the level of the subclavian vein and including the jugular, axillary, basilic, radial, ulnar and upper cephalic vein. Spectral Doppler was utilized to evaluate flow at rest and with distal augmentation maneuvers. COMPARISON:  None. FINDINGS: Contralateral Subclavian Vein: Respiratory phasicity is normal and  symmetric with the symptomatic side. No evidence of thrombus. Normal compressibility. Internal Jugular Vein: No evidence of thrombus. Normal compressibility, respiratory phasicity and response to augmentation. Subclavian Vein: No evidence of thrombus. Normal compressibility, respiratory phasicity and response to augmentation. Axillary Vein: No evidence of thrombus. Normal compressibility, respiratory phasicity and response to augmentation. Cephalic Vein: No evidence of thrombus. Normal compressibility, respiratory phasicity and response to augmentation. Basilic Vein: No evidence of thrombus. Normal compressibility, respiratory phasicity and response to augmentation. Brachial Veins: No evidence of thrombus. Normal compressibility, respiratory phasicity and response to augmentation. Radial Veins: No evidence of thrombus. Normal compressibility, respiratory phasicity and response to augmentation. Ulnar Veins: No evidence of thrombus. Normal compressibility, respiratory phasicity and response to augmentation. Venous Reflux:  None visualized. Other Findings:  None visualized. IMPRESSION: No evidence of DVT within the left upper extremity. Electronically Signed   By: Dorise Bullion III M.D   On: 01/19/2019 20:52   Dg Chest Portable 1 View  Result Date: 02/01/2019 CLINICAL DATA:  63 year old female with fever. EXAM: PORTABLE CHEST 1 VIEW COMPARISON:  Chest CT dated 01/31/2019 FINDINGS: Right pectoral Port-A-Cath with tip at the cavoatrial junction. Trace right pleural effusion. No focal consolidation or pneumothorax. The cardiac silhouette is within normal limits. No acute osseous pathology. IMPRESSION: Trace right pleural effusion. No focal consolidation. Electronically Signed   By: Anner Crete M.D.   On: 02/01/2019 22:48   Dg Chest Port 1 View  Result Date: 01/19/2019 CLINICAL DATA:  Possible sepsis. EXAM: PORTABLE CHEST 1 VIEW COMPARISON:  Chest radiograph 12/07/2018 FINDINGS: Monitoring leads overlie the  patient. Right anterior chest wall Port-A-Cath is present with tip projecting over the superior vena cava. Stable cardiac and mediastinal contours. Interval increase in consolidation within the right mid lower lung. No  pleural effusion or pneumothorax. IMPRESSION: Increasing right mid and lower lung consolidation which may represent pneumonia in the appropriate clinical setting. Followup PA and lateral chest X-ray is recommended in 3-4 weeks following trial of antibiotic therapy to ensure resolution and exclude underlying malignancy. Electronically Signed   By: Lovey Newcomer M.D.   On: 01/19/2019 18:14   Dg Shoulder Left  Result Date: 01/23/2019 CLINICAL DATA:  Left shoulder pain for 2 weeks.  No injury. EXAM: LEFT SHOULDER - 2+ VIEW COMPARISON:  None. FINDINGS: No fracture.  No bone lesion. Glenohumeral joint normally spaced and aligned. No arthropathic changes. Mild AC joint osteoarthritis. Soft tissues are unremarkable. IMPRESSION: 1. No fracture or bone lesion.  No acute finding. 2. Mild AC joint osteoarthritis. Electronically Signed   By: Lajean Manes M.D.   On: 01/23/2019 19:24   Dg Hand Complete Left  Result Date: 01/19/2019 CLINICAL DATA:  Left hand edema. Suspected sepsis. No reported injury. EXAM: LEFT HAND - COMPLETE 3+ VIEW COMPARISON:  None. FINDINGS: Peripheral intravenous catheter is noted in the dorsal left wrist soft tissues. No fracture or dislocation. No suspicious focal osseous lesions. No significant arthropathy. No osseous erosions or periosteal reaction. IMPRESSION: No acute osseous abnormality. Electronically Signed   By: Ilona Sorrel M.D.   On: 01/19/2019 18:15     Assessment and plan Patient is a 63 y.o. female with multiple comorbidities  including coronary artery disease, CHF, history of previous CVA, GERD, hypertension, hyperlipidemia, mitral valve prolapse,  obstructive sleep apnea, Stage IV high-grade ovarian serous serous carcinoma presents for follow up and discussion of  management for ovarian cancer.   1. 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 Port-A-Cath in place   3. Goals of care, counseling/discussion   4. Encounter for antineoplastic chemotherapy   5. Encounter for wound care    # Stage IV high-grade ovarian serous serous carcinoma Labs were reviewed and discussed with patient, also discussed with the patient's husband over the phone. Counts acceptable to proceed with cycle 1 carboplatin AUC 4 today. Repeat CBC CMP in 1 week and reassessment in the clinic. Husband reports that he has antiemetics at home.  #Wound was examined by me.  She has a very small area of scab being ripped off and has some exudate. I cleaned the area with alcohol and dressed wound with a Band-Aid. Recommend patient to use Bactroban topical ointment twice daily.  # Normocytic anemia, hemoglobin 10.1.  Stable.  Continue to monitor. #Hypokalemia, proceed with 10 mEq potassium chloride IV x 1 today.   Earlie Server, MD, PhD Hematology Oncology [place] [facility] 02/16/2019

## 2019-02-18 ENCOUNTER — Telehealth: Payer: Self-pay

## 2019-02-18 NOTE — Telephone Encounter (Signed)
Telephone call to patient for follow up after first chemotherapy infusion received on 02/15/2019.  Patient states feeling "fine".  No complaints voiced.  States eating and drinking fluids well.  Denies any nausea.  Encouraged patient to call for any questions or concerns.

## 2019-02-19 ENCOUNTER — Inpatient Hospital Stay: Payer: Medicaid Other | Attending: Hospice and Palliative Medicine | Admitting: Hospice and Palliative Medicine

## 2019-02-19 DIAGNOSIS — Z515 Encounter for palliative care: Secondary | ICD-10-CM | POA: Diagnosis not present

## 2019-02-19 DIAGNOSIS — R509 Fever, unspecified: Secondary | ICD-10-CM | POA: Insufficient documentation

## 2019-02-19 DIAGNOSIS — M25512 Pain in left shoulder: Secondary | ICD-10-CM | POA: Insufficient documentation

## 2019-02-19 DIAGNOSIS — Z8249 Family history of ischemic heart disease and other diseases of the circulatory system: Secondary | ICD-10-CM | POA: Insufficient documentation

## 2019-02-19 DIAGNOSIS — T451X5A Adverse effect of antineoplastic and immunosuppressive drugs, initial encounter: Secondary | ICD-10-CM | POA: Insufficient documentation

## 2019-02-19 DIAGNOSIS — Z9049 Acquired absence of other specified parts of digestive tract: Secondary | ICD-10-CM | POA: Insufficient documentation

## 2019-02-19 DIAGNOSIS — C569 Malignant neoplasm of unspecified ovary: Secondary | ICD-10-CM | POA: Insufficient documentation

## 2019-02-19 DIAGNOSIS — R5383 Other fatigue: Secondary | ICD-10-CM | POA: Insufficient documentation

## 2019-02-19 DIAGNOSIS — Z8261 Family history of arthritis: Secondary | ICD-10-CM | POA: Insufficient documentation

## 2019-02-19 DIAGNOSIS — R200 Anesthesia of skin: Secondary | ICD-10-CM | POA: Insufficient documentation

## 2019-02-19 DIAGNOSIS — I2510 Atherosclerotic heart disease of native coronary artery without angina pectoris: Secondary | ICD-10-CM | POA: Insufficient documentation

## 2019-02-19 DIAGNOSIS — Z8673 Personal history of transient ischemic attack (TIA), and cerebral infarction without residual deficits: Secondary | ICD-10-CM | POA: Insufficient documentation

## 2019-02-19 DIAGNOSIS — J90 Pleural effusion, not elsewhere classified: Secondary | ICD-10-CM | POA: Insufficient documentation

## 2019-02-19 DIAGNOSIS — R29810 Facial weakness: Secondary | ICD-10-CM | POA: Insufficient documentation

## 2019-02-19 DIAGNOSIS — Z883 Allergy status to other anti-infective agents status: Secondary | ICD-10-CM | POA: Insufficient documentation

## 2019-02-19 DIAGNOSIS — E785 Hyperlipidemia, unspecified: Secondary | ICD-10-CM | POA: Insufficient documentation

## 2019-02-19 DIAGNOSIS — K219 Gastro-esophageal reflux disease without esophagitis: Secondary | ICD-10-CM | POA: Insufficient documentation

## 2019-02-19 DIAGNOSIS — G893 Neoplasm related pain (acute) (chronic): Secondary | ICD-10-CM | POA: Diagnosis not present

## 2019-02-19 DIAGNOSIS — D6959 Other secondary thrombocytopenia: Secondary | ICD-10-CM | POA: Insufficient documentation

## 2019-02-19 DIAGNOSIS — G4733 Obstructive sleep apnea (adult) (pediatric): Secondary | ICD-10-CM | POA: Insufficient documentation

## 2019-02-19 DIAGNOSIS — D649 Anemia, unspecified: Secondary | ICD-10-CM | POA: Insufficient documentation

## 2019-02-19 DIAGNOSIS — C763 Malignant neoplasm of pelvis: Secondary | ICD-10-CM

## 2019-02-19 DIAGNOSIS — Z881 Allergy status to other antibiotic agents status: Secondary | ICD-10-CM | POA: Insufficient documentation

## 2019-02-19 DIAGNOSIS — I959 Hypotension, unspecified: Secondary | ICD-10-CM | POA: Insufficient documentation

## 2019-02-19 DIAGNOSIS — R471 Dysarthria and anarthria: Secondary | ICD-10-CM | POA: Insufficient documentation

## 2019-02-19 DIAGNOSIS — E876 Hypokalemia: Secondary | ICD-10-CM | POA: Insufficient documentation

## 2019-02-19 DIAGNOSIS — Z79899 Other long term (current) drug therapy: Secondary | ICD-10-CM | POA: Insufficient documentation

## 2019-02-19 DIAGNOSIS — H9190 Unspecified hearing loss, unspecified ear: Secondary | ICD-10-CM | POA: Insufficient documentation

## 2019-02-19 DIAGNOSIS — M8580 Other specified disorders of bone density and structure, unspecified site: Secondary | ICD-10-CM | POA: Insufficient documentation

## 2019-02-19 DIAGNOSIS — Z882 Allergy status to sulfonamides status: Secondary | ICD-10-CM | POA: Insufficient documentation

## 2019-02-19 NOTE — Progress Notes (Signed)
Virtual Visit via Telephone Note  I connected with Destiny [name] on 02/19/19 at 10:30 AM EDT by telephone and verified that I am speaking with the correct person using two identifiers.   I discussed the limitations, risks, security and privacy concerns of performing an evaluation and management service by telephone and the availability of in person appointments. I also discussed with the patient that there may be a patient responsible charge related to this service. The patient expressed understanding and agreed to proceed.   History of Present Illness: Palliative Care consult requested for this 63 y.o. 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  Patient was hospitalized 10/2018 with acute CVA.  She was hospitalized again 11/2018 with pneumonia and readmitted 01/19/2019 to 01/21/2019 for same.  Patient was last treated with chemotherapy 10/23/2018.  Patient was referred to palliative care to help address goals and manage ongoing symptoms.   Observations/Objective: Patient reports generalized pain that is not significantly improved on current pain regimen. Otherwise, she reports doing reasonably well without acute changes or concerns. She denies distressing symptoms other than pain. Patient feels treatment is going well. Oral intake is reportedly improved.   Assessment and Plan: [name] - patient continues to desire ongoing treatment  Neoplasm related pain - may increase oxycodone to 1-2 tablets Q4H PRN  Follow Up Instructions: RTC in 2-3 weeks   I discussed the assessment and treatment plan with the patient. The patient was provided an opportunity to ask questions and all were answered. The patient agreed with the plan and demonstrated an understanding of the instructions.   The patient was advised to call back or seek an in-person evaluation if the symptoms  worsen or if the condition fails to improve as anticipated.  I provided 10 minutes of non-face-to-face time during this encounter.   Irean Hong, NP

## 2019-02-21 ENCOUNTER — Telehealth: Payer: Self-pay | Admitting: Oncology

## 2019-02-21 ENCOUNTER — Other Ambulatory Visit: Payer: Self-pay

## 2019-02-21 DIAGNOSIS — C763 Malignant neoplasm of pelvis: Secondary | ICD-10-CM

## 2019-02-21 NOTE — Telephone Encounter (Signed)
VM received from husband about scheduled appts this week. Left detailed VM with appt date and time. Pt advised to contact office with any questions.

## 2019-02-21 NOTE — Progress Notes (Signed)
[facility] CDx request sent on specimen [id number] collected 08/21/18 at [facility]. Confirmation of receipt received.

## 2019-02-22 ENCOUNTER — Ambulatory Visit
Admission: RE | Admit: 2019-02-22 | Discharge: 2019-02-22 | Disposition: A | Discharge status: Home / Self Care | Payer: Medicaid Other | Source: Ambulatory Visit | Attending: Oncology | Admitting: Oncology

## 2019-02-22 ENCOUNTER — Encounter: Payer: Self-pay | Admitting: Oncology

## 2019-02-22 ENCOUNTER — Other Ambulatory Visit: Payer: Self-pay

## 2019-02-22 ENCOUNTER — Inpatient Hospital Stay (HOSPITAL_BASED_OUTPATIENT_CLINIC_OR_DEPARTMENT_OTHER): Payer: Medicaid Other | Admitting: Oncology

## 2019-02-22 ENCOUNTER — Inpatient Hospital Stay: Payer: Medicaid Other

## 2019-02-22 ENCOUNTER — Ambulatory Visit
Admission: RE | Admit: 2019-02-22 | Discharge: 2019-02-22 | Disposition: A | Discharge status: Home / Self Care | Payer: Medicaid Other | Attending: Oncology | Admitting: Oncology

## 2019-02-22 VITALS — BP 111/81 | HR 123 | Temp 98.4°F | Wt 189.7 lb

## 2019-02-22 DIAGNOSIS — I2510 Atherosclerotic heart disease of native coronary artery without angina pectoris: Secondary | ICD-10-CM | POA: Diagnosis not present

## 2019-02-22 DIAGNOSIS — I959 Hypotension, unspecified: Secondary | ICD-10-CM | POA: Diagnosis not present

## 2019-02-22 DIAGNOSIS — M25512 Pain in left shoulder: Secondary | ICD-10-CM

## 2019-02-22 DIAGNOSIS — Z883 Allergy status to other anti-infective agents status: Secondary | ICD-10-CM | POA: Diagnosis not present

## 2019-02-22 DIAGNOSIS — G4733 Obstructive sleep apnea (adult) (pediatric): Secondary | ICD-10-CM | POA: Diagnosis not present

## 2019-02-22 DIAGNOSIS — C763 Malignant neoplasm of pelvis: Secondary | ICD-10-CM | POA: Diagnosis not present

## 2019-02-22 DIAGNOSIS — R509 Fever, unspecified: Secondary | ICD-10-CM | POA: Diagnosis not present

## 2019-02-22 DIAGNOSIS — Z79899 Other long term (current) drug therapy: Secondary | ICD-10-CM | POA: Diagnosis not present

## 2019-02-22 DIAGNOSIS — Z881 Allergy status to other antibiotic agents status: Secondary | ICD-10-CM | POA: Diagnosis not present

## 2019-02-22 DIAGNOSIS — T451X5A Adverse effect of antineoplastic and immunosuppressive drugs, initial encounter: Secondary | ICD-10-CM | POA: Diagnosis not present

## 2019-02-22 DIAGNOSIS — H9190 Unspecified hearing loss, unspecified ear: Secondary | ICD-10-CM | POA: Diagnosis not present

## 2019-02-22 DIAGNOSIS — R29810 Facial weakness: Secondary | ICD-10-CM | POA: Diagnosis not present

## 2019-02-22 DIAGNOSIS — C569 Malignant neoplasm of unspecified ovary: Secondary | ICD-10-CM | POA: Diagnosis present

## 2019-02-22 DIAGNOSIS — R200 Anesthesia of skin: Secondary | ICD-10-CM | POA: Diagnosis not present

## 2019-02-22 DIAGNOSIS — E876 Hypokalemia: Secondary | ICD-10-CM | POA: Diagnosis not present

## 2019-02-22 DIAGNOSIS — R5383 Other fatigue: Secondary | ICD-10-CM | POA: Diagnosis not present

## 2019-02-22 DIAGNOSIS — M8580 Other specified disorders of bone density and structure, unspecified site: Secondary | ICD-10-CM | POA: Diagnosis not present

## 2019-02-22 DIAGNOSIS — D6959 Other secondary thrombocytopenia: Secondary | ICD-10-CM | POA: Diagnosis not present

## 2019-02-22 DIAGNOSIS — Z8673 Personal history of transient ischemic attack (TIA), and cerebral infarction without residual deficits: Secondary | ICD-10-CM | POA: Diagnosis not present

## 2019-02-22 DIAGNOSIS — Z882 Allergy status to sulfonamides status: Secondary | ICD-10-CM | POA: Diagnosis not present

## 2019-02-22 DIAGNOSIS — E785 Hyperlipidemia, unspecified: Secondary | ICD-10-CM | POA: Diagnosis not present

## 2019-02-22 DIAGNOSIS — D649 Anemia, unspecified: Secondary | ICD-10-CM | POA: Diagnosis not present

## 2019-02-22 DIAGNOSIS — R471 Dysarthria and anarthria: Secondary | ICD-10-CM | POA: Diagnosis not present

## 2019-02-22 DIAGNOSIS — J90 Pleural effusion, not elsewhere classified: Secondary | ICD-10-CM | POA: Diagnosis not present

## 2019-02-22 DIAGNOSIS — K219 Gastro-esophageal reflux disease without esophagitis: Secondary | ICD-10-CM | POA: Diagnosis not present

## 2019-02-22 LAB — CBC WITH DIFFERENTIAL/PLATELET
Abs Immature Granulocytes: 0.06 10*3/uL (ref 0.00–0.07)
Basophils Absolute: 0 10*3/uL (ref 0.0–0.1)
Basophils Relative: 1 %
Eosinophils Absolute: 0.3 10*3/uL (ref 0.0–0.5)
Eosinophils Relative: 4 %
HCT: 33.8 % — ABNORMAL LOW (ref 36.0–46.0)
Hemoglobin: 11 g/dL — ABNORMAL LOW (ref 12.0–15.0)
Immature Granulocytes: 1 %
Lymphocytes Relative: 23 %
Lymphs Abs: 1.5 10*3/uL (ref 0.7–4.0)
MCH: 30.9 pg (ref 26.0–34.0)
MCHC: 32.5 g/dL (ref 30.0–36.0)
MCV: 94.9 fL (ref 80.0–100.0)
Monocytes Absolute: 0.5 10*3/uL (ref 0.1–1.0)
Monocytes Relative: 8 %
Neutro Abs: 4.1 10*3/uL (ref 1.7–7.7)
Neutrophils Relative %: 63 %
Platelets: 177 10*3/uL (ref 150–400)
RBC: 3.56 MIL/uL — ABNORMAL LOW (ref 3.87–5.11)
RDW: 12.8 % (ref 11.5–15.5)
WBC: 6.4 10*3/uL (ref 4.0–10.5)
nRBC: 0 % (ref 0.0–0.2)

## 2019-02-22 LAB — COMPREHENSIVE METABOLIC PANEL
ALT: 29 U/L (ref 0–44)
AST: 32 U/L (ref 15–41)
Albumin: 3.7 g/dL (ref 3.5–5.0)
Alkaline Phosphatase: 71 U/L (ref 38–126)
Anion gap: 8 (ref 5–15)
BUN: 15 mg/dL (ref 8–23)
CO2: 24 mmol/L (ref 22–32)
Calcium: 9.1 mg/dL (ref 8.9–10.3)
Chloride: 106 mmol/L (ref 98–111)
Creatinine, Ser: 0.79 mg/dL (ref 0.44–1.00)
GFR calc Af Amer: >60 mL/min (ref >60)
GFR calc non Af Amer: >60 mL/min (ref >60)
Glucose, Bld: 111 mg/dL — ABNORMAL HIGH (ref 70–99)
Potassium: 3.5 mmol/L (ref 3.5–5.1)
Sodium: 138 mmol/L (ref 135–145)
Total Bilirubin: 0.4 mg/dL (ref 0.3–1.2)
Total Protein: 7.6 g/dL (ref 6.5–8.1)

## 2019-02-23 NOTE — Progress Notes (Signed)
Hematology/Oncology Follow Up Note [facility]  [phone] [phone]  Patient Care Team: [name], MD as PCP - General (Family Medicine)   Name of the patient: Destiny [name]  [id number]  September 23, 1955   REASON FOR VISIT Follow up for management of stage IV high-grade serous serous carcinoma    HPI  EMILYA [name] is a 63 y.o.afemale who has above oncology history reviewed by me today presented for follow up visit for management of ovarian cancer.  I first met patient on 11/05/2018, during the admission after she developed acute stroke after received chemotherapy.  Her oncology history listed as followed.  # stage IV high-grade serous serous carcinoma- [ preop CA125 1170] who underwent bilateral salpingo-oophorectomy 0/03/2329 complicated by bowel perforation status post exploratory laparotomy, small bowel resection, and reanastomosis, debridement 08/31/2018, [post op CA 125 251].  Her oncology care was at [facility] Dr.Soper. Per Dr.Soper, last chemotherapy was on 10/23/2018, s/p Carboplatin AUC 5.   # 11/02/2018  Patient was found to have hypotension, hypokalemia also pneumonia/meeting sepsis criteria.  Admitted  # 11/04/2018 left arm numbness and significantly weaker strength of left arm and left leg, mild dysarthria and facial droop. CT head was performed which showed no hemorrhagic changes and a CTA shows no LVO MRI of brain 11/04/2018 showed small acute infarcts right corona Reditabs, subcortical right parietal lobe, left cerebellum. Due to chemotherapy induced thrombocytopenia, antiplatelet was delayed.   #Echocardiogram shows no cardiac source of emboli with an EF of60-65% # 10/16/2018 CT abdomen pelvis with contrast   showed a small outpouching from the small bowel anastomotic site, small to moderate right-sided pleural effusion, some nodularity along left lateral costophrenic angle.  Right sided pleural effusion was also noted on CT chest  wo contrast on 11/01/2018, with underlying atelectasis/infiltrate.  Patient was discharged to Rehab. Patient has not been able to follow up due to readmission.   # 12/07/2018 readmitted due to acute encephalopathy, acute cystitis, HCAP.  # Readmitted again on 01/19/2019, due to CAP, Sepsis.  Discharged on 01/21/2019 Patient and her husband prefer to transfer her oncology care to [facility] and will also want to establish care with [place] [facility].   INTERVAL HISTORY Destiny [name] is a 63 y.o. female who has above history reviewed by me today presents for follow up visit for evaluation prior to chemotherapy for stage IV high-grade ovarian serous carcinoma Problems and complaints are listed below: Status post cycle 1 carboplatin AUC 4 Patient reports tolerating well.  Denies fever, chills, nausea, vomiting or diarrhea. Chronic fatigue slightly worse  Appetite is fair   Review of Systems  Constitutional: Positive for fatigue. Negative for appetite change, chills, fever and unexpected weight change.  HENT:   Positive for hearing loss. Negative for voice change.        She wears hearing aid today  Eyes: Negative for eye problems.  Respiratory: Negative for chest tightness, cough and shortness of breath.   Cardiovascular: Negative for chest pain.  Gastrointestinal: Negative for abdominal distention, abdominal pain and blood in stool.  Endocrine: Negative for hot flashes.  Genitourinary: Negative for difficulty urinating and frequency.   Musculoskeletal: Negative for arthralgias.       Left shoulder pain  Skin: Negative for itching and rash.  Neurological: Positive for extremity weakness.       Chronic left side weakness  Hematological: Negative for adenopathy.  Psychiatric/Behavioral: Negative for confusion.      Allergies  Allergen Reactions  Atorvastatin Hives    No s/sx of anaphylaxis, just intermittent hives.  Does not seem to be a class effect as she has tolerated lovastatin  previously No s/sx of anaphylaxis, just intermittent hives.  Does not seem to be a class effect as she has tolerated lovastatin previously   Fluconazole Rash    Reaction to generic but not name brand Reaction to generic but not name brand CAN USE DIFLUCAN.DOES NOT BREAK OUT WITH DIFLUCAN.(allergic only to generic brand)   Sertraline Itching   Sulfa Antibiotics Rash and Other (See Comments)    "hot " sensation   Sulfamethoxazole-Trimethoprim Rash and Other (See Comments)    "felt hot" Other reaction(s): Other (See Comments) "felt hot"   Sulfasalazine Other (See Comments) and Rash    "hot " sensation     Past Medical History:  Diagnosis Date   CAD (coronary artery disease)    CHF (congestive heart failure) (HCC)    CVA (cerebral infarction)    Female bladder prolapse    GERD (gastroesophageal reflux disease)    Hyperlipidemia    Hypertension    Hypokalemia    Mitral valve disease    Ovarian ca ([name])    Sleep apnea    Stroke ([place])    x3     Past Surgical History:  Procedure Laterality Date   CARPAL TUNNEL RELEASE Bilateral    CATARACT EXTRACTION Right 11/22/2017   CHOLECYSTECTOMY     COLON SURGERY     TENNIS ELBOW RELEASE/NIRSCHEL PROCEDURE Left    TONSILLECTOMY      Social History   Socioeconomic History   Marital status: Married    Spouse name: Not on file   Number of children: Not on file   Years of education: Not on file   Highest education level: Not on file  Occupational History   Not on file  Social Needs   Financial resource strain: Patient refused   Food insecurity    Worry: Never true    Inability: Never true   Transportation needs    Medical: No    Non-medical: No  Tobacco Use   Smoking status: Never Smoker   Smokeless tobacco: Never Used  Substance and Sexual Activity   Alcohol use: No    Alcohol/week: 0.0 standard drinks   Drug use: No   Sexual activity: Not Currently  Lifestyle   Physical  activity    Days per week: 0 days    Minutes per session: 0 min   Stress: Only a little  Relationships   Press photographer on phone: Twice a week    Gets together: Once a week    Attends religious service: 1 to 4 times per year    Active member of club or organization: No    Attends meetings of clubs or organizations: Never    Relationship status: Married   Intimate partner violence    Fear of current or ex partner: No    Emotionally abused: No    Physically abused: No    Forced sexual activity: No  Other Topics Concern   Not on file  Social History Narrative   Recently released from [facility], Lives home with husband    Family History  Problem Relation Age of Onset   Heart disease Other    Hypertension Other    Rheum arthritis Mother    CAD Father      Current Outpatient Medications:    acetaminophen (TYLENOL) 325 MG tablet, Take 1-2 tablets (  325-650 mg total) by mouth every 4 (four) hours as needed for mild pain., Disp: , Rfl:    amoxicillin-clavulanate (AUGMENTIN) 875-125 MG tablet, Take 1 tablet by mouth every 12 (twelve) hours., Disp: 7 tablet, Rfl: 0   aspirin 81 MG chewable tablet, Chew 81 mg by mouth daily., Disp: , Rfl:    Cholecalciferol (VITAMIN D3) 125 MCG (5000 UT) CAPS, Take 1 capsule (5,000 Units total) by mouth daily., Disp: 30 capsule, Rfl: 0   clonazePAM (KLONOPIN) 0.5 MG tablet, Take 1 tablet (0.5 mg total) by mouth at bedtime as needed for anxiety., Disp: 20 tablet, Rfl: 0   clopidogrel (PLAVIX) 75 MG tablet, Take 1 tablet (75 mg total) by mouth daily., Disp: 30 tablet, Rfl: 1   Docusate Calcium (STOOL SOFTENER PO), Take 100 mg by mouth daily. , Disp: , Rfl:    doxycycline (VIBRA-TABS) 100 MG tablet, Take 1 tablet (100 mg total) by mouth every 12 (twelve) hours., Disp: 7 tablet, Rfl: 0   famotidine (PEPCID) 20 MG tablet, Take 1 tablet (20 mg total) by mouth 2 (two) times daily., Disp: 60 tablet, Rfl: 0   fluconazole (DIFLUCAN) 150  MG tablet, Take 1 tablet (150 mg total) by mouth daily., Disp: 1 tablet, Rfl: 0   lidocaine-prilocaine (EMLA) cream, Apply to affected area once, Disp: 30 g, Rfl: 3   Melatonin 3 MG TABS, Take 3 mg by mouth at bedtime., Disp: , Rfl:    mometasone-formoterol (DULERA) 100-5 MCG/ACT AERO, Inhale 2 puffs into the lungs 2 times daily at 12 noon and 4 pm., Disp: , Rfl:    mupirocin ointment (BACTROBAN) 2 %, Apply topically daily., Disp: 15 g, Rfl: 0   nitroGLYCERIN (NITROSTAT) 0.4 MG SL tablet, Place 0.4 mg under the tongue every 5 (five) minutes as needed for chest pain., Disp: , Rfl:    ondansetron (ZOFRAN) 4 MG tablet, Take 4 mg by mouth every 8 (eight) hours as needed for nausea or vomiting., Disp: , Rfl:    oxybutynin (DITROPAN) 5 MG tablet, Take 1 tablet (5 mg total) by mouth 2 (two) times daily., Disp: 60 tablet, Rfl: 0   oxyCODONE (OXY IR/ROXICODONE) 5 MG immediate release tablet, Take 1-2 tablets (5-10 mg total) by mouth every 6 (six) hours as needed for severe pain., Disp: 60 tablet, Rfl: 0   PARoxetine (PAXIL) 30 MG tablet, Take 1 tablet (30 mg total) by mouth daily., Disp: 30 tablet, Rfl: 0   pregabalin (LYRICA) 150 MG capsule, Take 1 capsule (150 mg total) by mouth at bedtime., Disp: 30 capsule, Rfl: 0   pregabalin (LYRICA) 75 MG capsule, Take 1 capsule (75 mg total) by mouth 2 (two) times daily., Disp: 60 capsule, Rfl: 0   prochlorperazine (COMPAZINE) 10 MG tablet, Take 1 tablet (10 mg total) by mouth every 6 (six) hours as needed (Nausea or vomiting)., Disp: 30 tablet, Rfl: 1   rosuvastatin (CRESTOR) 20 MG tablet, Take 1 tablet (20 mg total) by mouth at bedtime., Disp: 30 tablet, Rfl: 0   spironolactone (ALDACTONE) 25 MG tablet, Take 0.5 tablets (12.5 mg total) by mouth daily., Disp: 30 tablet, Rfl: 0   venlafaxine XR (EFFEXOR-XR) 75 MG 24 hr capsule, Take 75 mg by mouth daily with breakfast., Disp: , Rfl:   Physical exam:  Today's Vitals   02/22/19 0948  BP: 111/81    Pulse: (!) 123  Temp: 98.4 F (36.9 C)  TempSrc: Tympanic  Weight: 189 lb 11.2 oz (86 kg)  PainSc: 5  Body mass index is 34.7 kg/m. Physical Exam Constitutional:      General: She is not in acute distress.    Comments: Frail appearance, sitting in wheelchair.   HENT:     Head: Normocephalic and atraumatic.  Eyes:     General: No scleral icterus.    Pupils: Pupils are equal, round, and reactive to light.  Neck:     Musculoskeletal: Normal range of motion and neck supple.  Cardiovascular:     Rate and Rhythm: Normal rate and regular rhythm.     Heart sounds: Normal heart sounds.  Pulmonary:     Effort: Pulmonary effort is normal. No respiratory distress.     Breath sounds: No wheezing.  Abdominal:     General: Bowel sounds are normal. There is no distension.     Palpations: Abdomen is soft. There is no mass.     Tenderness: There is no abdominal tenderness.  Musculoskeletal:        General: No deformity.     Comments: Left shoulder range of motion limited due to pain.  No swelling or erythema.  Skin:    General: Skin is warm and dry.     Findings: No erythema or rash.     Comments: Abdomen mid-line scar, No focal erythematous or swelling.   Neurological:     Mental Status: She is alert.     Cranial Nerves: No cranial nerve deficit.     Coordination: Coordination normal.     Comments: She wears hearing aid Left upper extremity 2/5,  Other extremities 3-4/5.    Psychiatric:        Behavior: Behavior normal.        Thought Content: Thought content normal.     CMP Latest Ref Rng & Units 02/22/2019  Glucose 70 - 99 mg/dL 111(H)  BUN 8 - 23 mg/dL 15  Creatinine 0.44 - 1.00 mg/dL 0.79  Sodium 135 - 145 mmol/L 138  Potassium 3.5 - 5.1 mmol/L 3.5  Chloride 98 - 111 mmol/L 106  CO2 22 - 32 mmol/L 24  Calcium 8.9 - 10.3 mg/dL 9.1  Total Protein 6.5 - 8.1 g/dL 7.6  Total Bilirubin 0.3 - 1.2 mg/dL 0.4  Alkaline Phos 38 - 126 U/L 71  AST 15 - 41 U/L 32  ALT 0 - 44  U/L 29   CBC Latest Ref Rng & Units 02/22/2019  WBC 4.0 - 10.5 K/uL 6.4  Hemoglobin 12.0 - 15.0 g/dL 11.0(L)  Hematocrit 36.0 - 46.0 % 33.8(L)  Platelets 150 - 400 K/uL 177   RADIOGRAPHIC STUDIES: I have personally reviewed the radiological images as listed and agreed with the findings in the report.  Ct Chest W Contrast  Result Date: 01/31/2019 CLINICAL DATA:  History of ovarian carcinoma.  Follow-up exam. EXAM: CT CHEST, ABDOMEN, AND PELVIS WITH CONTRAST TECHNIQUE: Multidetector CT imaging of the chest, abdomen and pelvis was performed following the standard protocol during bolus administration of intravenous contrast. CONTRAST:  145m OMNIPAQUE IOHEXOL 300 MG/ML  SOLN COMPARISON:  CT abdomen pelvis 10/16/2018 FINDINGS: CT CHEST FINDINGS Cardiovascular: Right anterior chest wall Port-A-Cath is present with tip terminating in the superior vena cava. Normal heart size. Thoracic aortic vascular calcifications. Mediastinum/Nodes: No enlarged axillary, mediastinal or hilar lymphadenopathy. Normal appearance of the esophagus. Lungs/Pleura: Central airways are patent. Dependent atelectasis. Persistent small right pleural effusion. No pneumothorax. Musculoskeletal: Thoracic spine degenerative changes. No aggressive or acute appearing osseous lesions. Healing posterior left twelfth rib fracture with callus formation. CT ABDOMEN  PELVIS FINDINGS Hepatobiliary: The liver is normal in size and contour. Prior cholecystectomy. There is a new nonspecific linear low-attenuation structure within the right hepatic lobe (image 60; series 2). Pancreas: Mildly atrophic. Spleen: Unremarkable Adrenals/Urinary Tract: Normal adrenal glands. Kidneys enhance symmetrically with contrast. No hydronephrosis. Urinary bladder is unremarkable. Stomach/Bowel: Normal morphology of the stomach. No evidence for small bowel obstruction. No free fluid or free intraperitoneal air. Prior omentectomy. Tiny nodules within the left upper quadrant  (image 68; series 5) are stable. Vascular/Lymphatic: Normal caliber abdominal aorta. Peripheral calcified atherosclerotic plaque. No retroperitoneal lymphadenopathy. Similar-appearing 8 mm aortocaval node (image 60; series 2). Reproductive: Prior hysterectomy. Other: Postsurgical changes involving the anterior abdominal wall. There is tubular soft tissue density within the central pelvis (image 91; series 2), located posterior to the uterus. Musculoskeletal: Lumbar spine degenerative changes. No aggressive or acute appearing osseous lesions. IMPRESSION: 1. There is a new nonspecific low-attenuation structure within the right hepatic lobe which may represent focal dilated bile duct, potentially sequelae of hepatic lesion, potentially treated given patient's current history of chemotherapy or focal fat and/or fluid. Consider dedicated evaluation with pre and post contrast-enhanced MRI. 2. There is similar-appearing tubular soft tissue density lower abdomen, posterior to the uterus which may be postsurgical in etiology. Recommend continued attention on follow-up. 3. Similar-appearing tiny nodularity with left upper quadrant adjacent to the stomach. Recommend attention on follow-up. 4. Persistent small to moderate right pleural effusion. Electronically Signed   By: Lovey Newcomer M.D.   On: 01/31/2019 20:52   Ct Abdomen Pelvis W Contrast  Result Date: 01/31/2019 CLINICAL DATA:  History of ovarian carcinoma.  Follow-up exam. EXAM: CT CHEST, ABDOMEN, AND PELVIS WITH CONTRAST TECHNIQUE: Multidetector CT imaging of the chest, abdomen and pelvis was performed following the standard protocol during bolus administration of intravenous contrast. CONTRAST:  121m OMNIPAQUE IOHEXOL 300 MG/ML  SOLN COMPARISON:  CT abdomen pelvis 10/16/2018 FINDINGS: CT CHEST FINDINGS Cardiovascular: Right anterior chest wall Port-A-Cath is present with tip terminating in the superior vena cava. Normal heart size. Thoracic aortic vascular  calcifications. Mediastinum/Nodes: No enlarged axillary, mediastinal or hilar lymphadenopathy. Normal appearance of the esophagus. Lungs/Pleura: Central airways are patent. Dependent atelectasis. Persistent small right pleural effusion. No pneumothorax. Musculoskeletal: Thoracic spine degenerative changes. No aggressive or acute appearing osseous lesions. Healing posterior left twelfth rib fracture with callus formation. CT ABDOMEN PELVIS FINDINGS Hepatobiliary: The liver is normal in size and contour. Prior cholecystectomy. There is a new nonspecific linear low-attenuation structure within the right hepatic lobe (image 60; series 2). Pancreas: Mildly atrophic. Spleen: Unremarkable Adrenals/Urinary Tract: Normal adrenal glands. Kidneys enhance symmetrically with contrast. No hydronephrosis. Urinary bladder is unremarkable. Stomach/Bowel: Normal morphology of the stomach. No evidence for small bowel obstruction. No free fluid or free intraperitoneal air. Prior omentectomy. Tiny nodules within the left upper quadrant (image 68; series 5) are stable. Vascular/Lymphatic: Normal caliber abdominal aorta. Peripheral calcified atherosclerotic plaque. No retroperitoneal lymphadenopathy. Similar-appearing 8 mm aortocaval node (image 60; series 2). Reproductive: Prior hysterectomy. Other: Postsurgical changes involving the anterior abdominal wall. There is tubular soft tissue density within the central pelvis (image 91; series 2), located posterior to the uterus. Musculoskeletal: Lumbar spine degenerative changes. No aggressive or acute appearing osseous lesions. IMPRESSION: 1. There is a new nonspecific low-attenuation structure within the right hepatic lobe which may represent focal dilated bile duct, potentially sequelae of hepatic lesion, potentially treated given patient's current history of chemotherapy or focal fat and/or fluid. Consider dedicated evaluation with pre and post contrast-enhanced MRI.  2. There is  similar-appearing tubular soft tissue density lower abdomen, posterior to the uterus which may be postsurgical in etiology. Recommend continued attention on follow-up. 3. Similar-appearing tiny nodularity with left upper quadrant adjacent to the stomach. Recommend attention on follow-up. 4. Persistent small to moderate right pleural effusion. Electronically Signed   By: Lovey Newcomer M.D.   On: 01/31/2019 20:52   Dg Chest Portable 1 View  Result Date: 02/01/2019 CLINICAL DATA:  63 year old female with fever. EXAM: PORTABLE CHEST 1 VIEW COMPARISON:  Chest CT dated 01/31/2019 FINDINGS: Right pectoral Port-A-Cath with tip at the cavoatrial junction. Trace right pleural effusion. No focal consolidation or pneumothorax. The cardiac silhouette is within normal limits. No acute osseous pathology. IMPRESSION: Trace right pleural effusion. No focal consolidation. Electronically Signed   By: Anner Crete M.D.   On: 02/01/2019 22:48   Dg Shoulder Left  Result Date: 02/22/2019 CLINICAL DATA:  Left shoulder pain. EXAM: LEFT SHOULDER - 2+ VIEW COMPARISON:  Chest 02/01/2019. FINDINGS: Acromioclavicular glenohumeral degenerative change. No evidence of fracture or dislocation. No evidence of separation. Diffuse osteopenia. Mild left pleural thickening. IMPRESSION: Diffuse osteopenia degenerative change. No acute abnormality identified. Electronically Signed   By: Marcello Moores  Register   On: 02/22/2019 16:06     Assessment and plan Patient is a 63 y.o. 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   1. Left shoulder pain, unspecified chronicity   2. 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 Normocytic anemia    # Stage IV high-grade ovarian serous serous  carcinoma Labs were reviewed and discussed with patient, also discussed with the patient's husband over the phone. s/p cycle 1 carboplatin AUC 4 today. Tolerating well clinically. If she continues to do well, plan at weekly Taxol to carboplatin AUC of 4.  Rationale and potential side effects of Taxol were discussed with patient and also with husband over the phone,  both agreed with the plan  #Left shoulder pain, likely osteoarthritis.  I will obtain left shoulder x-ray. For to orthopedic surgeon for further evaluation.  # Normocytic anemia, hemoglobin 11.  Stable.  Continue to monitor. We spent sufficient time to discuss many aspect of care, questions were answered to patient's satisfaction. Total face to face encounter time for this patient visit was 25 min. >50% of the time was  spent in counseling and coordination of care.   Follow-up in 2 weeks for evaluation prior to next cycle of treatment.  Earlie Server, MD, PhD Hematology Oncology [place] at [facility] 02/23/2019

## 2019-02-26 ENCOUNTER — Other Ambulatory Visit: Payer: Self-pay

## 2019-02-26 ENCOUNTER — Telehealth: Payer: Self-pay | Admitting: *Deleted

## 2019-02-26 DIAGNOSIS — R600 Localized edema: Secondary | ICD-10-CM

## 2019-02-26 NOTE — Telephone Encounter (Signed)
02/26/19 @ 2:10.I spoke with Verdis Frederickson in [place] and order is changed to a venous US

## 2019-02-26 NOTE — Telephone Encounter (Signed)
[place] department called asking for return call about the order for patient [place] tomorrow, they think it may not be correct [phone]

## 2019-02-27 ENCOUNTER — Ambulatory Visit
Admission: RE | Admit: 2019-02-27 | Discharge: 2019-02-27 | Disposition: A | Discharge status: Home / Self Care | Payer: Medicaid Other | Source: Ambulatory Visit | Attending: Oncology | Admitting: Oncology

## 2019-02-27 ENCOUNTER — Other Ambulatory Visit: Payer: Self-pay

## 2019-02-27 DIAGNOSIS — R600 Localized edema: Secondary | ICD-10-CM | POA: Diagnosis not present

## 2019-03-01 ENCOUNTER — Other Ambulatory Visit: Payer: Self-pay | Admitting: *Deleted

## 2019-03-01 DIAGNOSIS — M25512 Pain in left shoulder: Secondary | ICD-10-CM

## 2019-03-05 ENCOUNTER — Other Ambulatory Visit: Payer: Self-pay

## 2019-03-05 ENCOUNTER — Emergency Department: Payer: Medicaid Other

## 2019-03-05 ENCOUNTER — Encounter: Payer: Self-pay | Admitting: *Deleted

## 2019-03-05 ENCOUNTER — Inpatient Hospital Stay
Admission: EM | Admit: 2019-03-05 | Discharge: 2019-03-07 | DRG: 690 | Disposition: A | Discharge status: Home Health | Payer: Medicaid Other | Attending: Internal Medicine | Admitting: Internal Medicine

## 2019-03-05 DIAGNOSIS — I2510 Atherosclerotic heart disease of native coronary artery without angina pectoris: Secondary | ICD-10-CM | POA: Diagnosis present

## 2019-03-05 DIAGNOSIS — Z23 Encounter for immunization: Secondary | ICD-10-CM

## 2019-03-05 DIAGNOSIS — W19XXXA Unspecified fall, initial encounter: Secondary | ICD-10-CM | POA: Diagnosis present

## 2019-03-05 DIAGNOSIS — Z8261 Family history of arthritis: Secondary | ICD-10-CM | POA: Diagnosis not present

## 2019-03-05 DIAGNOSIS — I110 Hypertensive heart disease with heart failure: Secondary | ICD-10-CM | POA: Diagnosis present

## 2019-03-05 DIAGNOSIS — Z9049 Acquired absence of other specified parts of digestive tract: Secondary | ICD-10-CM

## 2019-03-05 DIAGNOSIS — E876 Hypokalemia: Secondary | ICD-10-CM | POA: Diagnosis present

## 2019-03-05 DIAGNOSIS — R296 Repeated falls: Secondary | ICD-10-CM | POA: Diagnosis present

## 2019-03-05 DIAGNOSIS — Z87440 Personal history of urinary (tract) infections: Secondary | ICD-10-CM | POA: Diagnosis not present

## 2019-03-05 DIAGNOSIS — Z888 Allergy status to other drugs, medicaments and biological substances status: Secondary | ICD-10-CM | POA: Diagnosis not present

## 2019-03-05 DIAGNOSIS — Z7902 Long term (current) use of antithrombotics/antiplatelets: Secondary | ICD-10-CM | POA: Diagnosis not present

## 2019-03-05 DIAGNOSIS — I69354 Hemiplegia and hemiparesis following cerebral infarction affecting left non-dominant side: Secondary | ICD-10-CM | POA: Diagnosis not present

## 2019-03-05 DIAGNOSIS — N390 Urinary tract infection, site not specified: Principal | ICD-10-CM | POA: Diagnosis present

## 2019-03-05 DIAGNOSIS — I5032 Chronic diastolic (congestive) heart failure: Secondary | ICD-10-CM | POA: Diagnosis present

## 2019-03-05 DIAGNOSIS — Z8249 Family history of ischemic heart disease and other diseases of the circulatory system: Secondary | ICD-10-CM

## 2019-03-05 DIAGNOSIS — E785 Hyperlipidemia, unspecified: Secondary | ICD-10-CM | POA: Diagnosis present

## 2019-03-05 DIAGNOSIS — R42 Dizziness and giddiness: Secondary | ICD-10-CM | POA: Diagnosis not present

## 2019-03-05 DIAGNOSIS — Z79899 Other long term (current) drug therapy: Secondary | ICD-10-CM | POA: Diagnosis not present

## 2019-03-05 DIAGNOSIS — Z7951 Long term (current) use of inhaled steroids: Secondary | ICD-10-CM | POA: Diagnosis not present

## 2019-03-05 DIAGNOSIS — F329 Major depressive disorder, single episode, unspecified: Secondary | ICD-10-CM | POA: Diagnosis present

## 2019-03-05 DIAGNOSIS — C52 Malignant neoplasm of vagina: Secondary | ICD-10-CM | POA: Diagnosis present

## 2019-03-05 DIAGNOSIS — C569 Malignant neoplasm of unspecified ovary: Secondary | ICD-10-CM | POA: Diagnosis present

## 2019-03-05 DIAGNOSIS — Z7982 Long term (current) use of aspirin: Secondary | ICD-10-CM | POA: Diagnosis not present

## 2019-03-05 DIAGNOSIS — Z79891 Long term (current) use of opiate analgesic: Secondary | ICD-10-CM

## 2019-03-05 DIAGNOSIS — K219 Gastro-esophageal reflux disease without esophagitis: Secondary | ICD-10-CM | POA: Diagnosis present

## 2019-03-05 DIAGNOSIS — Z20828 Contact with and (suspected) exposure to other viral communicable diseases: Secondary | ICD-10-CM | POA: Diagnosis present

## 2019-03-05 DIAGNOSIS — Z882 Allergy status to sulfonamides status: Secondary | ICD-10-CM | POA: Diagnosis not present

## 2019-03-05 LAB — BASIC METABOLIC PANEL
Anion gap: 10 (ref 5–15)
BUN: 12 mg/dL (ref 8–23)
CO2: 25 mmol/L (ref 22–32)
Calcium: 9.5 mg/dL (ref 8.9–10.3)
Chloride: 102 mmol/L (ref 98–111)
Creatinine, Ser: 0.87 mg/dL (ref 0.44–1.00)
GFR calc Af Amer: >60 mL/min (ref >60)
GFR calc non Af Amer: >60 mL/min (ref >60)
Glucose, Bld: 95 mg/dL (ref 70–99)
Potassium: 3.8 mmol/L (ref 3.5–5.1)
Sodium: 137 mmol/L (ref 135–145)

## 2019-03-05 LAB — URINALYSIS, COMPLETE (UACMP) WITH MICROSCOPIC
Bilirubin Urine: NEGATIVE
Glucose, UA: NEGATIVE mg/dL
Ketones, ur: NEGATIVE mg/dL
Nitrite: NEGATIVE
Protein, ur: NEGATIVE mg/dL
Specific Gravity, Urine: 1.043 — ABNORMAL HIGH (ref 1.005–1.030)
Squamous Epithelial / HPF: NONE SEEN (ref 0–5)
WBC, UA: >50 WBC/hpf — ABNORMAL HIGH (ref 0–5)
pH: 6 (ref 5.0–8.0)

## 2019-03-05 LAB — CBC
HCT: 33.2 % — ABNORMAL LOW (ref 36.0–46.0)
Hemoglobin: 10.8 g/dL — ABNORMAL LOW (ref 12.0–15.0)
MCH: 30.9 pg (ref 26.0–34.0)
MCHC: 32.5 g/dL (ref 30.0–36.0)
MCV: 94.9 fL (ref 80.0–100.0)
Platelets: 189 10*3/uL (ref 150–400)
RBC: 3.5 MIL/uL — ABNORMAL LOW (ref 3.87–5.11)
RDW: 13.6 % (ref 11.5–15.5)
WBC: 7 10*3/uL (ref 4.0–10.5)
nRBC: 0 % (ref 0.0–0.2)

## 2019-03-05 LAB — SARS CORONAVIRUS 2 BY RT PCR (HOSPITAL ORDER, PERFORMED IN ~~LOC~~ HOSPITAL LAB): SARS Coronavirus 2: NEGATIVE

## 2019-03-05 LAB — TSH: TSH: 0.049 u[IU]/mL — ABNORMAL LOW (ref 0.350–4.500)

## 2019-03-05 MED ORDER — PROCHLORPERAZINE MALEATE 10 MG PO TABS
10.0000 mg | ORAL_TABLET | Freq: Four times a day (QID) | ORAL | Status: DC | PRN
  Filled 2019-03-05: qty 1

## 2019-03-05 MED ORDER — CLONAZEPAM 0.5 MG PO TABS
0.5000 mg | ORAL_TABLET | Freq: Three times a day (TID) | ORAL | Status: DC | PRN
  Filled 2019-03-05: qty 1

## 2019-03-05 MED ORDER — ENOXAPARIN SODIUM 40 MG/0.4ML ~~LOC~~ SOLN
40.0000 mg | SUBCUTANEOUS | Status: DC
  Administered 2019-03-05 – 2019-03-06 (×2): 40 mg via SUBCUTANEOUS
  Filled 2019-03-05 (×2): qty 0.4

## 2019-03-05 MED ORDER — PAROXETINE HCL 30 MG PO TABS
30.0000 mg | ORAL_TABLET | Freq: Every day | ORAL | Status: DC
  Administered 2019-03-06 – 2019-03-07 (×2): 30 mg via ORAL
  Filled 2019-03-05 (×2): qty 1

## 2019-03-05 MED ORDER — ONDANSETRON HCL 4 MG PO TABS: 4.0000 mg | ORAL_TABLET | Freq: Three times a day (TID) | ORAL | Status: DC | PRN

## 2019-03-05 MED ORDER — VITAMIN D 25 MCG (1000 UNIT) PO TABS
5000.0000 [IU] | ORAL_TABLET | Freq: Every day | ORAL | Status: DC
  Administered 2019-03-06 – 2019-03-07 (×2): 5000 [IU] via ORAL
  Filled 2019-03-05 (×2): qty 5

## 2019-03-05 MED ORDER — CLOPIDOGREL BISULFATE 75 MG PO TABS
75.0000 mg | ORAL_TABLET | Freq: Every day | ORAL | Status: DC
  Administered 2019-03-06 – 2019-03-07 (×2): 75 mg via ORAL
  Filled 2019-03-05 (×2): qty 1

## 2019-03-05 MED ORDER — ACETAMINOPHEN 325 MG PO TABS
ORAL_TABLET | ORAL | Status: AC
  Filled 2019-03-05: qty 2

## 2019-03-05 MED ORDER — MOMETASONE FURO-FORMOTEROL FUM 100-5 MCG/ACT IN AERO
2.0000 | INHALATION_SPRAY | Freq: Two times a day (BID) | RESPIRATORY_TRACT | Status: DC
  Administered 2019-03-06 – 2019-03-07 (×3): 2 via RESPIRATORY_TRACT
  Filled 2019-03-05: qty 8.8

## 2019-03-05 MED ORDER — SODIUM CHLORIDE 0.9 % IV SOLN
INTRAVENOUS | Status: DC
  Administered 2019-03-05 – 2019-03-06 (×3): via INTRAVENOUS

## 2019-03-05 MED ORDER — NITROGLYCERIN 0.4 MG SL SUBL: 0.4000 mg | SUBLINGUAL_TABLET | SUBLINGUAL | Status: DC | PRN

## 2019-03-05 MED ORDER — PREGABALIN 75 MG PO CAPS
75.0000 mg | ORAL_CAPSULE | Freq: Two times a day (BID) | ORAL | Status: DC
  Administered 2019-03-06 – 2019-03-07 (×4): 75 mg via ORAL
  Filled 2019-03-05 (×4): qty 1

## 2019-03-05 MED ORDER — SODIUM CHLORIDE 0.9 % IV SOLN
1.0000 g | Freq: Once | INTRAVENOUS | Status: AC
  Administered 2019-03-05: 1 g via INTRAVENOUS
  Filled 2019-03-05: qty 10

## 2019-03-05 MED ORDER — OXYBUTYNIN CHLORIDE 5 MG PO TABS
5.0000 mg | ORAL_TABLET | Freq: Two times a day (BID) | ORAL | Status: DC
  Administered 2019-03-05 – 2019-03-07 (×4): 5 mg via ORAL
  Filled 2019-03-05 (×5): qty 1

## 2019-03-05 MED ORDER — PREGABALIN 75 MG PO CAPS
150.0000 mg | ORAL_CAPSULE | Freq: Every day | ORAL | Status: DC
  Administered 2019-03-05 – 2019-03-06 (×2): 150 mg via ORAL
  Filled 2019-03-05 (×2): qty 2

## 2019-03-05 MED ORDER — SODIUM CHLORIDE 0.9% FLUSH
3.0000 mL | Freq: Once | INTRAVENOUS | Status: AC
  Administered 2019-03-05: 3 mL via INTRAVENOUS

## 2019-03-05 MED ORDER — ACETAMINOPHEN 325 MG PO TABS: 325.0000 mg | ORAL_TABLET | ORAL | Status: DC | PRN

## 2019-03-05 MED ORDER — SPIRONOLACTONE 25 MG PO TABS
12.5000 mg | ORAL_TABLET | Freq: Every day | ORAL | Status: DC
  Administered 2019-03-06 – 2019-03-07 (×2): 12.5 mg via ORAL
  Filled 2019-03-05 (×2): qty 1
  Filled 2019-03-05 (×2): qty 0.5

## 2019-03-05 MED ORDER — FAMOTIDINE 20 MG PO TABS
20.0000 mg | ORAL_TABLET | Freq: Two times a day (BID) | ORAL | Status: DC
  Administered 2019-03-05 – 2019-03-07 (×4): 20 mg via ORAL
  Filled 2019-03-05 (×4): qty 1

## 2019-03-05 MED ORDER — ACETAMINOPHEN 650 MG RE SUPP
650.0000 mg | Freq: Four times a day (QID) | RECTAL | Status: DC | PRN
  Filled 2019-03-05: qty 1

## 2019-03-05 MED ORDER — OXYCODONE HCL 5 MG PO TABS
5.0000 mg | ORAL_TABLET | Freq: Four times a day (QID) | ORAL | Status: DC | PRN
  Administered 2019-03-06 – 2019-03-07 (×3): 5 mg via ORAL
  Filled 2019-03-05 (×4): qty 1

## 2019-03-05 MED ORDER — ACETAMINOPHEN 325 MG PO TABS
650.0000 mg | ORAL_TABLET | Freq: Once | ORAL | Status: AC
  Administered 2019-03-05: 650 mg via ORAL

## 2019-03-05 MED ORDER — SODIUM CHLORIDE 0.9 % IV SOLN
1.0000 g | INTRAVENOUS | Status: DC
  Filled 2019-03-05: qty 10

## 2019-03-05 MED ORDER — SODIUM CHLORIDE 0.9 % IV SOLN: INTRAVENOUS | Status: DC

## 2019-03-05 MED ORDER — IOHEXOL 350 MG/ML SOLN
75.0000 mL | Freq: Once | INTRAVENOUS | Status: AC | PRN
  Administered 2019-03-05: 75 mL via INTRAVENOUS

## 2019-03-05 MED ORDER — TETANUS-DIPHTH-ACELL PERTUSSIS 5-2.5-18.5 LF-MCG/0.5 IM SUSP
0.5000 mL | Freq: Once | INTRAMUSCULAR | Status: AC
  Administered 2019-03-05: 0.5 mL via INTRAMUSCULAR
  Filled 2019-03-05: qty 0.5

## 2019-03-05 MED ORDER — ACETAMINOPHEN 325 MG PO TABS: 650.0000 mg | ORAL_TABLET | Freq: Four times a day (QID) | ORAL | Status: DC | PRN

## 2019-03-05 MED ORDER — VENLAFAXINE HCL ER 75 MG PO CP24
75.0000 mg | ORAL_CAPSULE | Freq: Every day | ORAL | Status: DC
  Administered 2019-03-06 – 2019-03-07 (×2): 75 mg via ORAL
  Filled 2019-03-05 (×3): qty 1

## 2019-03-05 MED ORDER — SODIUM CHLORIDE 0.9 % IV BOLUS
1000.0000 mL | Freq: Once | INTRAVENOUS | Status: AC
  Administered 2019-03-05: 1000 mL via INTRAVENOUS

## 2019-03-05 NOTE — ED Notes (Signed)
Attempted to call family. No answer at this time. Pt transported to Xray.

## 2019-03-05 NOTE — ED Notes (Signed)
ED TO INPATIENT HANDOFF REPORT  ED Nurse Name and Phone #: Seth Bake [phone]  S Name/Age/Gender Destiny [name] 63 y.o. female Room/Bed: ED34A/ED34A  Code Status   Code Status: Full Code  Home/SNF/Other Home Patient oriented to: self, place, time and situation Is this baseline? Yes   Triage Complete: Triage complete  Chief Complaint fall ems  Triage Note Pt to ED after a fall this afternoon. Pt reportedly bend over and fell on left side. Pain and abrasion to left elbow and pain to left hip with shortening of left leg. Hx of stroke that caused left sided weakness. No head injury.   Pt reporting she has been off balance for the last week after a stroke last week with intermittent dizziness. No LOC.   Pt currently receiving chemo for Vaginal Cancer. Next treatment is scheduled for Friday.    Allergies Allergies  Allergen Reactions  . Atorvastatin Hives    No s/sx of anaphylaxis, just intermittent hives.  Does not seem to be a class effect as she has tolerated lovastatin previously No s/sx of anaphylaxis, just intermittent hives.  Does not seem to be a class effect as she has tolerated lovastatin previously  . Fluconazole Rash    Reaction to generic but not name brand Reaction to generic but not name brand CAN USE DIFLUCAN.DOES NOT BREAK OUT WITH DIFLUCAN.(allergic only to generic brand)  . Sertraline Itching  . Sulfa Antibiotics Rash and Other (See Comments)    "hot " sensation  . Sulfamethoxazole-Trimethoprim Rash and Other (See Comments)    "felt hot" Other reaction(s): Other (See Comments) "felt hot"  . Sulfasalazine Other (See Comments) and Rash    "hot " sensation    Level of Care/Admitting Diagnosis ED Disposition    ED Disposition Condition [facility] Area: [place] [[id number]]  Level of Care: Med-Surg [16]  Covid Evaluation: Asymptomatic Screening Protocol (No Symptoms)  Diagnosis: UTI (urinary tract infection) [[id number]]   Admitting Physician: Dustin Flock [[id number]]  Attending Physician: Dustin Flock [[id number]]  Estimated length of stay: past midnight tomorrow  Certification:: I certify this patient will need inpatient services for at least 2 midnights  PT Class (Do Not Modify): Inpatient [101]  PT Acc Code (Do Not Modify): Private [1]       B Medical/Surgery History Past Medical History:  Diagnosis Date  . CAD (coronary artery disease)   . CHF (congestive heart failure) ([place])   . CVA (cerebral infarction)   . Female bladder prolapse   . GERD (gastroesophageal reflux disease)   . Hyperlipidemia   . Hypertension   . Hypokalemia   . Mitral valve disease   . Ovarian ca ([place])   . Sleep apnea   . Stroke [facility])    x3   Past Surgical History:  Procedure Laterality Date  . CARPAL TUNNEL RELEASE Bilateral   . CATARACT EXTRACTION Right 11/22/2017  . CHOLECYSTECTOMY    . COLON SURGERY    . TENNIS ELBOW RELEASE/NIRSCHEL PROCEDURE Left   . TONSILLECTOMY       A IV Location/Drains/Wounds Patient Lines/Drains/Airways Status   Active Line/Drains/Airways    Name:   Placement date:   Placement time:   Site:   Days:   Implanted Port Right Chest   -    -    Chest      Peripheral IV 03/05/19 Right Antecubital   03/05/19    1205    Antecubital   less than 1  Incision (Closed) 01/20/19 Abdomen Medial   01/20/19    0055     44   Wound / Incision (Open or Dehisced) 12/08/18 Other (Comment);Incision - Open Abdomen Medial;Upper old incision, open at top   12/08/18    0630    Abdomen   87          Intake/Output Last 24 hours  Intake/Output Summary (Last 24 hours) at 03/05/2019 2212 Last data filed at 03/05/2019 1707 Gross per 24 hour  Intake 1100 ml  Output -  Net 1100 ml    Labs/Imaging Results for orders placed or performed during the hospital encounter of 03/05/19 (from the past 48 hour(s))  Basic metabolic panel     Status: None   Collection Time: 03/05/19 11:42 AM  Result Value Ref Range    Sodium 137 135 - 145 mmol/L   Potassium 3.8 3.5 - 5.1 mmol/L   Chloride 102 98 - 111 mmol/L   CO2 25 22 - 32 mmol/L   Glucose, Bld 95 70 - 99 mg/dL   BUN 12 8 - 23 mg/dL   Creatinine, Ser 0.87 0.44 - 1.00 mg/dL   Calcium 9.5 8.9 - 10.3 mg/dL   GFR calc non Af Amer >60 >60 mL/min   GFR calc Af Amer >60 >60 mL/min   Anion gap 10 5 - 15    Comment: Performed at [facility], [address] [place], [place] [address]  CBC     Status: Abnormal   Collection Time: 03/05/19 11:42 AM  Result Value Ref Range   WBC 7.0 4.0 - 10.5 K/uL   RBC 3.50 (L) 3.87 - 5.11 MIL/uL   Hemoglobin 10.8 (L) 12.0 - 15.0 g/dL   HCT 33.2 (L) 36.0 - 46.0 %   MCV 94.9 80.0 - 100.0 fL   MCH 30.9 26.0 - 34.0 pg   MCHC 32.5 30.0 - 36.0 g/dL   RDW 13.6 11.5 - 15.5 %   Platelets 189 150 - 400 K/uL   nRBC 0.0 0.0 - 0.2 %    Comment: Performed at [facility], [address] [place], [place] [address]  Urinalysis, Complete w Microscopic     Status: Abnormal   Collection Time: 03/05/19 11:42 AM  Result Value Ref Range   Color, Urine YELLOW (A) YELLOW   APPearance CLOUDY (A) CLEAR   Specific Gravity, Urine 1.043 (H) 1.005 - 1.030   pH 6.0 5.0 - 8.0   Glucose, UA NEGATIVE NEGATIVE mg/dL   Hgb urine dipstick MODERATE (A) NEGATIVE   Bilirubin Urine NEGATIVE NEGATIVE   Ketones, ur NEGATIVE NEGATIVE mg/dL   Protein, ur NEGATIVE NEGATIVE mg/dL   Nitrite NEGATIVE NEGATIVE   Leukocytes,Ua LARGE (A) NEGATIVE   RBC / HPF 11-20 0 - 5 RBC/hpf   WBC, UA >50 (H) 0 - 5 WBC/hpf   Bacteria, UA FEW (A) NONE SEEN   Squamous Epithelial / LPF NONE SEEN 0 - 5   WBC Clumps PRESENT    Mucus PRESENT     Comment: Performed at [facility], [address] [place], [place] [address]  SARS Coronavirus 2 [facility] order, Performed in [facility] hospital lab) Nasopharyngeal Nasopharyngeal Swab     Status: None   Collection Time: 03/05/19  4:35 PM   Specimen: Nasopharyngeal Swab  Result Value Ref Range    SARS Coronavirus 2 NEGATIVE NEGATIVE    Comment: (NOTE) If result is NEGATIVE SARS-CoV-2 target nucleic acids are NOT DETECTED. The SARS-CoV-2 RNA is generally detectable  in upper and lower  respiratory specimens during the acute phase of infection. The lowest  concentration of SARS-CoV-2 viral copies this assay can detect is 250  copies / mL. A negative result does not preclude SARS-CoV-2 infection  and should not be used as the sole basis for treatment or other  patient management decisions.  A negative result may occur with  improper specimen collection / handling, submission of specimen other  than nasopharyngeal swab, presence of viral mutation(s) within the  areas targeted by this assay, and inadequate number of viral copies  (<250 copies / mL). A negative result must be combined with clinical  observations, patient history, and epidemiological information. If result is POSITIVE SARS-CoV-2 target nucleic acids are DETECTED. The SARS-CoV-2 RNA is generally detectable in upper and lower  respiratory specimens dur ing the acute phase of infection.  Positive  results are indicative of active infection with SARS-CoV-2.  Clinical  correlation with patient history and other diagnostic information is  necessary to determine patient infection status.  Positive results do  not rule out bacterial infection or co-infection with other viruses. If result is PRESUMPTIVE POSTIVE SARS-CoV-2 nucleic acids MAY BE PRESENT.   A presumptive positive result was obtained on the submitted specimen  and confirmed on repeat testing.  While 2019 novel coronavirus  (SARS-CoV-2) nucleic acids may be present in the submitted sample  additional confirmatory testing may be necessary for epidemiological  and / or clinical management purposes  to differentiate between  SARS-CoV-2 and other Sarbecovirus currently known to infect humans.  If clinically indicated additional testing with an alternate test   methodology [phone]) is advised. The SARS-CoV-2 RNA is generally  detectable in upper and lower respiratory sp ecimens during the acute  phase of infection. The expected result is Negative. Fact Sheet for Patients:  StrictlyIdeas.no Fact Sheet for Healthcare Providers: BankingDealers.co.za This test is not yet approved or cleared by the [place] FDA and has been authorized for detection and/or diagnosis of SARS-CoV-2 by FDA under an Emergency Use Authorization (EUA).  This EUA will remain in effect (meaning this test can be used) for the duration of the COVID-19 declaration under Section 564(b)(1) of the Act, 21 U.S.C. section 360bbb-3(b)(1), unless the authorization is terminated or revoked sooner. Performed at [facility], [address] [place], [place] [address]    Dg Elbow 2 Views Left  Result Date: 03/05/2019 CLINICAL DATA:  Fall EXAM: LEFT ELBOW - 2 VIEW COMPARISON:  None. FINDINGS: There is no evidence of fracture, dislocation, or joint effusion. There is no evidence of arthropathy or other focal bone abnormality. Soft tissues are unremarkable. IMPRESSION: Negative. Electronically Signed   By: Rolm Baptise M.D.   On: 03/05/2019 12:32   Ct Head Wo Contrast  Result Date: 03/05/2019 CLINICAL DATA:  Recent fall with headaches and neck pain, initial encounter EXAM: CT HEAD WITHOUT CONTRAST CT CERVICAL SPINE WITHOUT CONTRAST TECHNIQUE: Multidetector CT imaging of the head and cervical spine was performed following the standard protocol without intravenous contrast. Multiplanar CT image reconstructions of the cervical spine were also generated. COMPARISON:  12/14/2018 FINDINGS: CT HEAD FINDINGS Brain: Atrophic changes and chronic white matter ischemic changes are identified similar to that seen on prior exam with multiple areas of prior infarct identified. These are stable in appearance from the prior exam. No findings to  suggest acute hemorrhage, acute infarction or space-occupying mass lesion are noted. Cavum septum pellucidum is again noted. Vascular: No hyperdense vessel or unexpected calcification. Skull:  Normal. Negative for fracture or focal lesion. Sinuses/Orbits: No acute finding. Other: None. CT CERVICAL SPINE FINDINGS Alignment: Within normal limits. Skull base and vertebrae: 7 cervical segments are well visualized. Vertebral body height is well maintained. Multilevel facet hypertrophic changes are noted. No acute fracture or acute facet abnormality seen. Soft tissues and spinal canal: Surrounding soft tissue structures show enlargement of the thyroid consistent with underlying goiter. This is stable from the prior exam. No other focal abnormality is seen. Upper chest: Visualized lung apices are within normal limits. Other: Right chest wall port is noted. IMPRESSION: CT of the head: Chronic atrophic and ischemic changes similar to that noted on the prior exam. No acute abnormality noted. CT of the cervical spine: Multilevel degenerative change without acute abnormality. Electronically Signed   By: Inez Catalina M.D.   On: 03/05/2019 14:10   Ct Angio Chest Pe W And/or Wo Contrast  Result Date: 03/05/2019 CLINICAL DATA:  Chest pain. Recent fall. History of ovarian carcinoma EXAM: CT ANGIOGRAPHY CHEST WITH CONTRAST TECHNIQUE: Multidetector CT imaging of the chest was performed using the standard protocol during bolus administration of intravenous contrast. Multiplanar CT image reconstructions and MIPs were obtained to evaluate the vascular anatomy. CONTRAST:  14mL OMNIPAQUE IOHEXOL 350 MG/ML SOLN COMPARISON:  Chest CT January 31, 2019 FINDINGS: Cardiovascular: There is no demonstrable pulmonary embolus. There is no appreciable thoracic aortic aneurysm or dissection. Visualized great vessels appear normal except for slight calcification in the proximal left subclavian artery. There are foci of aortic atherosclerosis and foci  of coronary artery calcification. There is no pericardial effusion or pericardial thickening. Port-A-Cath tip is in the superior cava. Mediastinum/Nodes: There is thyroid enlargement with multiple nodular lesions in the thyroid, unchanged. No dominant thyroid mass evident. There is no appreciable thoracic adenopathy. No esophageal lesions are evident. Lungs/Pleura: There is a fairly small free-flowing pleural effusion on the right. There is atelectatic change in the lung bases. There is no evident edema or consolidation. No parenchymal lung mass. Upper Abdomen: There is upper abdominal aortic atherosclerosis. Subcentimeter foci of increased attenuation in the omentum are stable compared to recent study and may represent small metastatic foci given history of ovarian carcinoma. Musculoskeletal: There is degenerative change in the thoracic spine. No blastic or lytic bone lesions. Port-A-Cath port is in the anterior right supraclavicular region superficially. Review of the MIP images confirms the above findings. IMPRESSION: 1. No demonstrable pulmonary embolus. No thoracic aortic aneurysm or dissection. There is aortic atherosclerosis as well as foci of great vessel and coronary artery calcification. 2. Fairly small free-flowing right pleural effusion. Bibasilar atelectasis. No consolidation. No pulmonary nodular lesions evident. 3.  Prominent thyroid with multinodular goiter. 4.  No evident thoracic adenopathy. 5. 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 6.  Port-A-Cath tip in superior vena cava. Aortic Atherosclerosis (ICD10-I70.0). Electronically Signed   By: Lowella Grip III M.D.   On: 03/05/2019 14:10   Ct Cervical Spine Wo Contrast  Result Date: 03/05/2019 CLINICAL DATA:  Recent fall with headaches and neck pain, initial encounter EXAM: CT HEAD WITHOUT CONTRAST CT CERVICAL SPINE WITHOUT CONTRAST TECHNIQUE: Multidetector CT imaging of the head and cervical  spine was performed following the standard protocol without intravenous contrast. Multiplanar CT image reconstructions of the cervical spine were also generated. COMPARISON:  12/14/2018 FINDINGS: CT HEAD FINDINGS Brain: Atrophic changes and chronic white matter ischemic changes are identified similar to that seen on prior exam with multiple areas of prior  infarct identified. These are stable in appearance from the prior exam. No findings to suggest acute hemorrhage, acute infarction or space-occupying mass lesion are noted. Cavum septum pellucidum is again noted. Vascular: No hyperdense vessel or unexpected calcification. Skull: Normal. Negative for fracture or focal lesion. Sinuses/Orbits: No acute finding. Other: None. CT CERVICAL SPINE FINDINGS Alignment: Within normal limits. Skull base and vertebrae: 7 cervical segments are well visualized. Vertebral body height is well maintained. Multilevel facet hypertrophic changes are noted. No acute fracture or acute facet abnormality seen. Soft tissues and spinal canal: Surrounding soft tissue structures show enlargement of the thyroid consistent with underlying goiter. This is stable from the prior exam. No other focal abnormality is seen. Upper chest: Visualized lung apices are within normal limits. Other: Right chest wall port is noted. IMPRESSION: CT of the head: Chronic atrophic and ischemic changes similar to that noted on the prior exam. No acute abnormality noted. CT of the cervical spine: Multilevel degenerative change without acute abnormality. Electronically Signed   By: Inez Catalina M.D.   On: 03/05/2019 14:10   Dg Hip Unilat W Or Wo Pelvis 2-3 Views Left  Result Date: 03/05/2019 CLINICAL DATA:  Fall EXAM: DG HIP (WITH OR WITHOUT PELVIS) 2-3V LEFT COMPARISON:  None. FINDINGS: Hip joints and SI joints are symmetric and unremarkable. No acute bony abnormality. Specifically, no fracture, subluxation, or dislocation. Overlying the lateral soft tissue  swelling, likely soft tissue hematoma. IMPRESSION: No acute bony abnormality. Overlying lateral soft tissue swelling/hematoma. Electronically Signed   By: Rolm Baptise M.D.   On: 03/05/2019 12:31    Pending Labs Unresulted Labs (From admission, onward)    Start     Ordered   03/12/19 0500  Creatinine, serum  (enoxaparin (LOVENOX)    CrCl >/= 30 ml/min)  Weekly,   STAT    Comments: while on enoxaparin therapy    03/05/19 2011   03/06/19 0500  CBC  Tomorrow morning,   STAT     03/05/19 2011   03/06/19 7893  Basic metabolic panel  Tomorrow morning,   STAT     03/05/19 2011   03/05/19 2011  TSH  Once,   STAT     03/05/19 2011   03/05/19 1550  Urine culture  Add-on,   AD     03/05/19 1549          Vitals/Pain Today's Vitals   03/05/19 2000 03/05/19 2008 03/05/19 2200 03/05/19 2210  BP:  109/74  107/77  Pulse:  (!) 101  99  Resp:  18  18  Temp:      TempSrc:      SpO2:  98%  100%  Weight:      Height:      PainSc: 0-No pain  0-No pain 0-No pain    Isolation Precautions No active isolations  Medications Medications  acetaminophen (TYLENOL) tablet 325-650 mg (has no administration in time range)  oxyCODONE (Oxy IR/ROXICODONE) immediate release tablet 5 mg (has no administration in time range)  nitroGLYCERIN (NITROSTAT) SL tablet 0.4 mg (has no administration in time range)  spironolactone (ALDACTONE) tablet 12.5 mg (has no administration in time range)  PARoxetine (PAXIL) tablet 30 mg (30 mg Oral Not Given 03/05/19 2122)  prochlorperazine (COMPAZINE) tablet 10 mg (has no administration in time range)  venlafaxine XR (EFFEXOR-XR) 24 hr capsule 75 mg (has no administration in time range)  famotidine (PEPCID) tablet 20 mg (20 mg Oral Given 03/05/19 2157)  ondansetron (ZOFRAN) tablet 4 mg (has  no administration in time range)  oxybutynin (DITROPAN) tablet 5 mg (5 mg Oral Given 03/05/19 2157)  clopidogrel (PLAVIX) tablet 75 mg (75 mg Oral Not Given 03/05/19 2123)  pregabalin  (LYRICA) capsule 150 mg (150 mg Oral Given 03/05/19 2156)  pregabalin (LYRICA) capsule 75 mg (has no administration in time range)  Vitamin D3 CAPS 5,000 Units (5,000 Units Oral Not Given 03/05/19 2122)  mometasone-formoterol (DULERA) 100-5 MCG/ACT inhaler 2 puff (has no administration in time range)  enoxaparin (LOVENOX) injection 40 mg (40 mg Subcutaneous Given 03/05/19 2203)  acetaminophen (TYLENOL) tablet 650 mg (has no administration in time range)    Or  acetaminophen (TYLENOL) suppository 650 mg (has no administration in time range)  cefTRIAXone (ROCEPHIN) 1 g in sodium chloride 0.9 % 100 mL IVPB (has no administration in time range)  0.9 %  sodium chloride infusion ( Intravenous New Bag/Given 03/05/19 2153)  clonazePAM (KLONOPIN) tablet 0.5 mg (has no administration in time range)  sodium chloride flush (NS) 0.9 % injection 3 mL (3 mLs Intravenous Given 03/05/19 1206)  sodium chloride 0.9 % bolus 1,000 mL (0 mLs Intravenous Stopped 03/05/19 1512)  acetaminophen (TYLENOL) tablet 650 mg (650 mg Oral Given 03/05/19 1308)  iohexol (OMNIPAQUE) 350 MG/ML injection 75 mL (75 mLs Intravenous Contrast Given 03/05/19 1349)  cefTRIAXone (ROCEPHIN) 1 g in sodium chloride 0.9 % 100 mL IVPB (0 g Intravenous Stopped 03/05/19 1707)  Tdap (BOOSTRIX) injection 0.5 mL (0.5 mLs Intramuscular Given 03/05/19 2207)    Mobility walks with device High fall risk   Focused Assessments Cardiac Assessment Handoff:  Cardiac Rhythm: Sinus tachycardia Lab Results  Component Value Date   CKTOTAL 93 06/07/2014   CKMB 0.7 06/07/2014   TROPONINI <0.03 12/07/2018   No results found for: DDIMER Does the Patient currently have chest pain? No     R Recommendations: See Admitting Provider Note  Report given to:   Additional Notes: n/a

## 2019-03-05 NOTE — H&P (Signed)
[facility] NAME: Destiny [name]    MR#:  [id number]  DATE OF BIRTH:  [birth year]  DATE OF ADMISSION:  03/05/2019  PRIMARY CARE PHYSICIAN: Destiny Lank, MD   REQUESTING/REFERRING PHYSICIAN: Vanessa Genoa, MD  CHIEF COMPLAINT:   Chief Complaint  Patient presents with  . Fall  . Dizziness    HISTORY OF PRESENT ILLNESS: Destiny [name]  is a 63 y.o. female with a known history of coronary artery disease, congestive heart failure, CVA, GERD,  Hypertension, hyperlipemia and sleep apnea who is presenting with complaint of a fall and dizziness.  Patient has a history of ovarian cancer and is receiving chemo as outpatient.  According to husband she also had a stroke in April.  And he has chronic weakness with the left upper extremity and left lower extremity.  She has had 3 falls within the past few weeks.  She fell earlier today as well.  She is brought to the ER and noted to have urinary tract infection.     PAST MEDICAL HISTORY:   Past Medical History:  Diagnosis Date  . CAD (coronary artery disease)   . CHF (congestive heart failure) ([place])   . CVA (cerebral infarction)   . Female bladder prolapse   . GERD (gastroesophageal reflux disease)   . Hyperlipidemia   . Hypertension   . Hypokalemia   . Mitral valve disease   . Ovarian ca ([place])   . Sleep apnea   . Stroke [facility])    x3    PAST SURGICAL HISTORY:  Past Surgical History:  Procedure Laterality Date  . CARPAL TUNNEL RELEASE Bilateral   . CATARACT EXTRACTION Right 11/22/2017  . CHOLECYSTECTOMY    . COLON SURGERY    . TENNIS ELBOW RELEASE/NIRSCHEL PROCEDURE Left   . TONSILLECTOMY      SOCIAL HISTORY:  Social History   Tobacco Use  . Smoking status: Never Smoker  . Smokeless tobacco: Never Used  Substance Use Topics  . Alcohol use: No    Alcohol/week: 0.0 standard drinks    FAMILY HISTORY:  Family History  Problem Relation Age of Onset  . Heart disease Other   .  Hypertension Other   . Rheum arthritis Mother   . CAD Father     DRUG ALLERGIES:  Allergies  Allergen Reactions  . Atorvastatin Hives    No s/sx of anaphylaxis, just intermittent hives.  Does not seem to be a class effect as she has tolerated lovastatin previously No s/sx of anaphylaxis, just intermittent hives.  Does not seem to be a class effect as she has tolerated lovastatin previously  . Fluconazole Rash    Reaction to generic but not name brand Reaction to generic but not name brand CAN USE DIFLUCAN.DOES NOT BREAK OUT WITH DIFLUCAN.(allergic only to generic brand)  . Sertraline Itching  . Sulfa Antibiotics Rash and Other (See Comments)    "hot " sensation  . Sulfamethoxazole-Trimethoprim Rash and Other (See Comments)    "felt hot" Other reaction(s): Other (See Comments) "felt hot"  . Sulfasalazine Other (See Comments) and Rash    "hot " sensation    REVIEW OF SYSTEMS:   CONSTITUTIONAL: No fever, fatigue or positive weakness.  EYES: No blurred or double vision.  EARS, NOSE, AND THROAT: No tinnitus or ear pain.  RESPIRATORY: No cough, shortness of breath, wheezing or hemoptysis.  CARDIOVASCULAR: No chest pain, orthopnea, edema.  GASTROINTESTINAL: No nausea, vomiting, diarrhea or abdominal pain.  GENITOURINARY: No dysuria, hematuria.  ENDOCRINE: No polyuria, nocturia,  HEMATOLOGY: No anemia, easy bruising or bleeding SKIN: No rash or lesion. MUSCULOSKELETAL: No joint pain or arthritis.   NEUROLOGIC: No tingling, numbness, weakness.  PSYCHIATRY: No anxiety or depression.   MEDICATIONS AT HOME:  Prior to Admission medications   Medication Sig Start Date End Date Taking? Authorizing Provider  acetaminophen (TYLENOL) 325 MG tablet Take 1-2 tablets (325-650 mg total) by mouth every 4 (four) hours as needed for mild pain. 11/28/18   Angiulli, Lavon Paganini, PA-C  amoxicillin-clavulanate (AUGMENTIN) 875-125 MG tablet Take 1 tablet by mouth every 12 (twelve) hours. 01/21/19    Loletha Grayer, MD  aspirin 81 MG chewable tablet Chew 81 mg by mouth daily.    [provider]  Cholecalciferol (VITAMIN D3) 125 MCG (5000 UT) CAPS Take 1 capsule (5,000 Units total) by mouth daily. 11/28/18   Angiulli, Lavon Paganini, PA-C  clonazePAM (KLONOPIN) 0.5 MG tablet Take 1 tablet (0.5 mg total) by mouth at bedtime as needed for anxiety. 11/28/18   Angiulli, Lavon Paganini, PA-C  clopidogrel (PLAVIX) 75 MG tablet Take 1 tablet (75 mg total) by mouth daily. 11/28/18   Angiulli, Lavon Paganini, PA-C  Docusate Calcium (STOOL SOFTENER PO) Take 100 mg by mouth daily.     [provider]  doxycycline (VIBRA-TABS) 100 MG tablet Take 1 tablet (100 mg total) by mouth every 12 (twelve) hours. 01/21/19   Loletha Grayer, MD  famotidine (PEPCID) 20 MG tablet Take 1 tablet (20 mg total) by mouth 2 (two) times daily. 11/28/18   Angiulli, Lavon Paganini, PA-C  fluconazole (DIFLUCAN) 150 MG tablet Take 1 tablet (150 mg total) by mouth daily. 02/02/19   Rudene Re, MD  lidocaine-prilocaine (EMLA) cream Apply to affected area once 02/06/19   Earlie Server, MD  Melatonin 3 MG TABS Take 3 mg by mouth at bedtime.    [provider]  mometasone-formoterol (DULERA) 100-5 MCG/ACT AERO Inhale 2 puffs into the lungs 2 times daily at 12 noon and 4 pm. 01/24/18   [provider]  mupirocin ointment (BACTROBAN) 2 % Apply topically daily. 02/15/19   Earlie Server, MD  nitroGLYCERIN (NITROSTAT) 0.4 MG SL tablet Place 0.4 mg under the tongue every 5 (five) minutes as needed for chest pain.    [provider]  ondansetron (ZOFRAN) 4 MG tablet Take 4 mg by mouth every 8 (eight) hours as needed for nausea or vomiting.    [provider]  oxybutynin (DITROPAN) 5 MG tablet Take 1 tablet (5 mg total) by mouth 2 (two) times daily. 11/28/18   Angiulli, Lavon Paganini, PA-C  oxyCODONE (OXY IR/ROXICODONE) 5 MG immediate release tablet Take 1-2 tablets (5-10 mg total) by mouth every 6 (six) hours as needed for severe  pain. 01/31/19   Borders, Kirt Boys, NP  PARoxetine (PAXIL) 30 MG tablet Take 1 tablet (30 mg total) by mouth daily. 11/28/18   Angiulli, Lavon Paganini, PA-C  pregabalin (LYRICA) 150 MG capsule Take 1 capsule (150 mg total) by mouth at bedtime. 11/28/18   Angiulli, Lavon Paganini, PA-C  pregabalin (LYRICA) 75 MG capsule Take 1 capsule (75 mg total) by mouth 2 (two) times daily. 12/13/18   Mayo, Pete Pelt, MD  prochlorperazine (COMPAZINE) 10 MG tablet Take 1 tablet (10 mg total) by mouth every 6 (six) hours as needed (Nausea or vomiting). 02/06/19   Earlie Server, MD  rosuvastatin (CRESTOR) 20 MG tablet Take 1 tablet (20 mg total) by mouth at bedtime. 11/28/18  Angiulli, Lavon Paganini, PA-C  spironolactone (ALDACTONE) 25 MG tablet Take 0.5 tablets (12.5 mg total) by mouth daily. 11/28/18 11/28/19  Angiulli, Lavon Paganini, PA-C  venlafaxine XR (EFFEXOR-XR) 75 MG 24 hr capsule Take 75 mg by mouth daily with breakfast.    [provider]      PHYSICAL EXAMINATION:   VITAL SIGNS: Blood pressure 124/90, pulse (!) 101, temperature 98.2 F (36.8 C), temperature source Oral, resp. rate 19, height 5\' 2"  (1.575 m), weight 87.5 kg, SpO2 99 %.  GENERAL:  63 y.o.-year-old patient lying in the bed with no acute distress.  EYES: Pupils equal, round, reactive to light and accommodation. No scleral icterus. Extraocular muscles intact.  HEENT: Head atraumatic, normocephalic. Oropharynx and nasopharynx clear.  NECK:  Supple, no jugular venous distention. No thyroid enlargement, no tenderness.  LUNGS: Normal breath sounds bilaterally, no wheezing, rales,rhonchi or crepitation. No use of accessory muscles of respiration.  CARDIOVASCULAR: S1, S2 normal. No murmurs, rubs, or gallops.  ABDOMEN: Soft, nontender, nondistended. Bowel sounds present. No organomegaly or mass.  EXTREMITIES: No pedal edema, cyanosis, or clubbing.  NEUROLOGIC: Cranial nerves II through XII are intact. Muscle strength 5/5 in all extremities. Sensation intact. Gait  not checked.  PSYCHIATRIC: The patient is alert and oriented x 3.  SKIN: No obvious rash, lesion, or ulcer.   LABORATORY PANEL:   CBC Recent Labs  Lab 03/05/19 1142  WBC 7.0  HGB 10.8*  HCT 33.2*  PLT 189  MCV 94.9  MCH 30.9  MCHC 32.5  RDW 13.6   ------------------------------------------------------------------------------------------------------------------  Chemistries  Recent Labs  Lab 03/05/19 1142  NA 137  K 3.8  CL 102  CO2 25  GLUCOSE 95  BUN 12  CREATININE 0.87  CALCIUM 9.5   ------------------------------------------------------------------------------------------------------------------ estimated creatinine clearance is 67.1 mL/min (by C-G formula based on SCr of 0.87 mg/dL). ------------------------------------------------------------------------------------------------------------------ No results for input(s): TSH, T4TOTAL, T3FREE, THYROIDAB in the last 72 hours.  Invalid input(s): FREET3   Coagulation profile No results for input(s): INR, PROTIME in the last 168 hours. ------------------------------------------------------------------------------------------------------------------- No results for input(s): DDIMER in the last 72 hours. -------------------------------------------------------------------------------------------------------------------  Cardiac Enzymes No results for input(s): CKMB, TROPONINI, MYOGLOBIN in the last 168 hours.  Invalid input(s): CK ------------------------------------------------------------------------------------------------------------------ Invalid input(s): POCBNP  ---------------------------------------------------------------------------------------------------------------  Urinalysis    Component Value Date/Time   COLORURINE YELLOW (A) 03/05/2019 1142   APPEARANCEUR CLOUDY (A) 03/05/2019 1142   APPEARANCEUR Cloudy 06/07/2014 0826   LABSPEC 1.043 (H) 03/05/2019 1142   LABSPEC 1.017 06/07/2014 0826    PHURINE 6.0 03/05/2019 1142   GLUCOSEU NEGATIVE 03/05/2019 1142   GLUCOSEU Negative 06/07/2014 0826   HGBUR MODERATE (A) 03/05/2019 1142   BILIRUBINUR NEGATIVE 03/05/2019 1142   BILIRUBINUR Negative 06/07/2014 0826   KETONESUR NEGATIVE 03/05/2019 1142   PROTEINUR NEGATIVE 03/05/2019 1142   NITRITE NEGATIVE 03/05/2019 1142   LEUKOCYTESUR LARGE (A) 03/05/2019 1142   LEUKOCYTESUR 3+ 06/07/2014 0826     RADIOLOGY: Dg Elbow 2 Views Left  Result Date: 03/05/2019 CLINICAL DATA:  Fall EXAM: LEFT ELBOW - 2 VIEW COMPARISON:  None. FINDINGS: There is no evidence of fracture, dislocation, or joint effusion. There is no evidence of arthropathy or other focal bone abnormality. Soft tissues are unremarkable. IMPRESSION: Negative. Electronically Signed   By: Rolm Baptise M.D.   On: 03/05/2019 12:32   Ct Head Wo Contrast  Result Date: 03/05/2019 CLINICAL DATA:  Recent fall with headaches and neck pain, initial encounter EXAM: CT HEAD WITHOUT CONTRAST CT CERVICAL SPINE WITHOUT CONTRAST TECHNIQUE:  Multidetector CT imaging of the head and cervical spine was performed following the standard protocol without intravenous contrast. Multiplanar CT image reconstructions of the cervical spine were also generated. COMPARISON:  12/14/2018 FINDINGS: CT HEAD FINDINGS Brain: Atrophic changes and chronic white matter ischemic changes are identified similar to that seen on prior exam with multiple areas of prior infarct identified. These are stable in appearance from the prior exam. No findings to suggest acute hemorrhage, acute infarction or space-occupying mass lesion are noted. Cavum septum pellucidum is again noted. Vascular: No hyperdense vessel or unexpected calcification. Skull: Normal. Negative for fracture or focal lesion. Sinuses/Orbits: No acute finding. Other: None. CT CERVICAL SPINE FINDINGS Alignment: Within normal limits. Skull base and vertebrae: 7 cervical segments are well visualized. Vertebral body height is  well maintained. Multilevel facet hypertrophic changes are noted. No acute fracture or acute facet abnormality seen. Soft tissues and spinal canal: Surrounding soft tissue structures show enlargement of the thyroid consistent with underlying goiter. This is stable from the prior exam. No other focal abnormality is seen. Upper chest: Visualized lung apices are within normal limits. Other: Right chest wall port is noted. IMPRESSION: CT of the head: Chronic atrophic and ischemic changes similar to that noted on the prior exam. No acute abnormality noted. CT of the cervical spine: Multilevel degenerative change without acute abnormality. Electronically Signed   By: Inez Catalina M.D.   On: 03/05/2019 14:10   Ct Angio Chest Pe W And/or Wo Contrast  Result Date: 03/05/2019 CLINICAL DATA:  Chest pain. Recent fall. History of ovarian carcinoma EXAM: CT ANGIOGRAPHY CHEST WITH CONTRAST TECHNIQUE: Multidetector CT imaging of the chest was performed using the standard protocol during bolus administration of intravenous contrast. Multiplanar CT image reconstructions and MIPs were obtained to evaluate the vascular anatomy. CONTRAST:  78mL OMNIPAQUE IOHEXOL 350 MG/ML SOLN COMPARISON:  Chest CT January 31, 2019 FINDINGS: Cardiovascular: There is no demonstrable pulmonary embolus. There is no appreciable thoracic aortic aneurysm or dissection. Visualized great vessels appear normal except for slight calcification in the proximal left subclavian artery. There are foci of aortic atherosclerosis and foci of coronary artery calcification. There is no pericardial effusion or pericardial thickening. Port-A-Cath tip is in the superior cava. Mediastinum/Nodes: There is thyroid enlargement with multiple nodular lesions in the thyroid, unchanged. No dominant thyroid mass evident. There is no appreciable thoracic adenopathy. No esophageal lesions are evident. Lungs/Pleura: There is a fairly small free-flowing pleural effusion on the right.  There is atelectatic change in the lung bases. There is no evident edema or consolidation. No parenchymal lung mass. Upper Abdomen: There is upper abdominal aortic atherosclerosis. Subcentimeter foci of increased attenuation in the omentum are stable compared to recent study and may represent small metastatic foci given history of ovarian carcinoma. Musculoskeletal: There is degenerative change in the thoracic spine. No blastic or lytic bone lesions. Port-A-Cath port is in the anterior right supraclavicular region superficially. Review of the MIP images confirms the above findings. IMPRESSION: 1. No demonstrable pulmonary embolus. No thoracic aortic aneurysm or dissection. There is aortic atherosclerosis as well as foci of great vessel and coronary artery calcification. 2. Fairly small free-flowing right pleural effusion. Bibasilar atelectasis. No consolidation. No pulmonary nodular lesions evident. 3.  Prominent thyroid with multinodular goiter. 4.  No evident thoracic adenopathy. 5. 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 6.  Port-A-Cath tip in superior vena cava. Aortic Atherosclerosis (ICD10-I70.0). Electronically Signed   By: Lowella Grip III M.D.  On: 03/05/2019 14:10   Ct Cervical Spine Wo Contrast  Result Date: 03/05/2019 CLINICAL DATA:  Recent fall with headaches and neck pain, initial encounter EXAM: CT HEAD WITHOUT CONTRAST CT CERVICAL SPINE WITHOUT CONTRAST TECHNIQUE: Multidetector CT imaging of the head and cervical spine was performed following the standard protocol without intravenous contrast. Multiplanar CT image reconstructions of the cervical spine were also generated. COMPARISON:  12/14/2018 FINDINGS: CT HEAD FINDINGS Brain: Atrophic changes and chronic white matter ischemic changes are identified similar to that seen on prior exam with multiple areas of prior infarct identified. These are stable in appearance from the prior exam. No  findings to suggest acute hemorrhage, acute infarction or space-occupying mass lesion are noted. Cavum septum pellucidum is again noted. Vascular: No hyperdense vessel or unexpected calcification. Skull: Normal. Negative for fracture or focal lesion. Sinuses/Orbits: No acute finding. Other: None. CT CERVICAL SPINE FINDINGS Alignment: Within normal limits. Skull base and vertebrae: 7 cervical segments are well visualized. Vertebral body height is well maintained. Multilevel facet hypertrophic changes are noted. No acute fracture or acute facet abnormality seen. Soft tissues and spinal canal: Surrounding soft tissue structures show enlargement of the thyroid consistent with underlying goiter. This is stable from the prior exam. No other focal abnormality is seen. Upper chest: Visualized lung apices are within normal limits. Other: Right chest wall port is noted. IMPRESSION: CT of the head: Chronic atrophic and ischemic changes similar to that noted on the prior exam. No acute abnormality noted. CT of the cervical spine: Multilevel degenerative change without acute abnormality. Electronically Signed   By: Inez Catalina M.D.   On: 03/05/2019 14:10   Dg Hip Unilat W Or Wo Pelvis 2-3 Views Left  Result Date: 03/05/2019 CLINICAL DATA:  Fall EXAM: DG HIP (WITH OR WITHOUT PELVIS) 2-3V LEFT COMPARISON:  None. FINDINGS: Hip joints and SI joints are symmetric and unremarkable. No acute bony abnormality. Specifically, no fracture, subluxation, or dislocation. Overlying the lateral soft tissue swelling, likely soft tissue hematoma. IMPRESSION: No acute bony abnormality. Overlying lateral soft tissue swelling/hematoma. Electronically Signed   By: Rolm Baptise M.D.   On: 03/05/2019 12:31    EKG: Orders placed or performed during the hospital encounter of 03/05/19  . ED EKG  . ED EKG    IMPRESSION AND PLAN: Patient is 63 year old with history of ovarian cancer presenting with generalized weakness fall   1.  Urinary  tract infection we will treat with IV ceftriaxone Follow urine culture  2.  Recurrent falls PT evaluation May need rehab placement  3.  History of stroke continue Plavix  4.  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  Depression continue home medications  6.  Hyperlipidemia continue Crestor  7.  Miscellaneous Lovenox for DVT prophylaxis  All the records are reviewed and case discussed with ED provider. Management plans discussed with the patient, family and they are in agreement.  CODE STATUS: Code Status History    Date Active Date Inactive Code Status Order ID Comments User Context   01/19/2019 2231 01/21/2019 1519 Full Code [id number]  Henreitta Leber, MD Inpatient   12/07/2018 1535 12/13/2018 2205 Full Code [id number]  Demetrios Loll, MD Inpatient   11/09/2018 1522 11/29/2018 1232 Full Code [id number]  Flora Lipps Inpatient   11/02/2018 0448 11/09/2018 1459 Full Code [id number]  Lance Coon, MD Inpatient   11/13/2017 0804 11/14/2017 1819 Full Code [id number]  Loletha Grayer, MD ED   11/05/2017 [phone]  11/06/2017 2025 Full Code [id number]  Gorden Harms, MD Inpatient   02/05/2017 0035 02/05/2017 2254 Full Code [id number]  Valinda Party, DO Inpatient   02/15/2016 9536 02/15/2016 2136 Full Code [id number]  Hillary Bow, MD Inpatient   02/14/2016 1728 02/15/2016 0747 Full Code [id number]  Dustin Flock, MD ED   06/16/2015 1753 06/19/2015 1822 Full Code [id number]  Clayburn Pert, MD Inpatient   Advance Care Planning Activity       TOTAL TIME TAKING CARE OF THIS PATIENT:55 minutes.    Dustin Flock M.D on 03/05/2019 at 3:57 PM  Between 7am to 6pm - Pager - [phone]  After 6pm go to [email or web address] - password [email or web address] [facility]  [phone]  CC: Primary care physician; Destiny Lank, MD

## 2019-03-05 NOTE — Progress Notes (Signed)
Advanced care plan.  Purpose of the Encounter: CODE STATUS  Parties in Attendance: Patient herself and her husband   Patient's Decision Capacity: Intact  Subjective/Patient's story: Destiny [name]  is a 63 y.o. female with a known history of ovarian cancer , coronary artery disease, congestive heart failure, CVA, GERD,  Hypertension, hyperlipemia and sleep apnea who is presenting with complaint of a fall and dizziness.  Patient has a history of ovarian cancer and is receiving chemo as outpatient.   Objective/Medical story I discussed with the patient regarding her desires for cardiac and pulmonary resuscitation.  Also asked her about living will and healthcare power of attorney   Goals of care determination:  Patient states she would like everything to be done. CODE STATUS: Full code   Time spent discussing advanced care planning: 16 minutes

## 2019-03-05 NOTE — ED Triage Notes (Signed)
Pt to ED after a fall this afternoon. Pt reportedly bend over and fell on left side. Pain and abrasion to left elbow and pain to left hip with shortening of left leg. Hx of stroke that caused left sided weakness. No head injury.   Pt reporting she has been off balance for the last week after a stroke last week with intermittent dizziness. No LOC.   Pt currently receiving chemo for Vaginal Cancer. Next treatment is scheduled for Friday.

## 2019-03-05 NOTE — ED Notes (Signed)
MD at bedside. 

## 2019-03-05 NOTE — ED Notes (Signed)
Pt changed and cleaned out of soiled briefs. New briefs placed on pt by this tech. Pt repositioned in bed by this tech and Furniture conservator/restorer.

## 2019-03-05 NOTE — ED Provider Notes (Signed)
[facility] Emergency Department Provider Note  ____________________________________________   First MD Initiated Contact with Patient 03/05/19 1131     (approximate)  I have reviewed the triage vital signs and the nursing notes.   HISTORY  Chief Complaint Fall and Dizziness    HPI Destiny [name] is a 63 y.o. 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  Patient was reportedly bending over when she fell onto her left side.  Patient has some pain in her left elbow and left hip that are mild, constant, nothing makes it better, nothing makes it worse.  From her prior stroke she has some left-sided weakness.  Patient is been more off balance with a little bit of increased shortness of breath over the past 1 week. She says she has been feeling dizzy and having dysuria as well.           Past Medical History:  Diagnosis Date   CAD (coronary artery disease)    CHF (congestive heart failure) (HCC)    CVA (cerebral infarction)    Female bladder prolapse    GERD (gastroesophageal reflux disease)    Hyperlipidemia    Hypertension    Hypokalemia    Mitral valve disease    Ovarian ca [facility])    Sleep apnea    Stroke [facility])    x3    Patient Active Problem List   Diagnosis Date Noted   Port-A-Cath in place 02/16/2019   Goals of care, counseling/discussion 02/07/2019   Pneumonia 01/19/2019   HAP (hospital-acquired pneumonia) 12/07/2018   Acute blood loss anemia    Chronic systolic congestive heart failure (HCC)    Embolic stroke ([place]) 57/32/2025   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[place])    Cerebrovascular accident (CVA) ([place])    HLD (hyperlipidemia) 11/02/2018   GERD (gastroesophageal reflux disease) 11/02/2018   CAD (coronary artery disease) 11/02/2018   Sleep apnea 11/02/2018   Hypotension 11/02/2018   Somnolence 11/02/2018   Physical deconditioning 10/24/2018    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   Thrombocytopenia ([place]) 10/06/2018   Malnutrition ([place]) 10/06/2018   S/P exploratory laparotomy 10/06/2018   Moderate episode of recurrent major depressive disorder ([place]) 09/28/2018   Failure to thrive (0-17) 09/27/2018   Abscess after procedure 09/09/2018   Bowel perforation ([place]) 08/25/2018   Adnexal mass 08/20/2018   Fallopian tube cancer, 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 Authorizing Provider  acetaminophen (TYLENOL) 325 MG tablet Take 1-2 tablets (325-650 mg total) by mouth every 4 (four) hours as needed for mild pain. 11/28/18   Angiulli, Lavon Paganini, PA-C  amoxicillin-clavulanate (AUGMENTIN) 875-125 MG tablet Take 1 tablet by mouth every 12 (twelve) hours. 01/21/19   Loletha Grayer, MD  aspirin 81 MG chewable tablet Chew 81 mg by mouth daily.    [provider]  Cholecalciferol (VITAMIN D3) 125 MCG (5000 UT) CAPS Take 1 capsule (5,000 Units  total) by mouth daily. 11/28/18   Angiulli, Lavon Paganini, PA-C  clonazePAM (KLONOPIN) 0.5 MG tablet Take 1 tablet (0.5 mg total) by mouth at bedtime as needed for anxiety. 11/28/18   Angiulli, Lavon Paganini, PA-C  clopidogrel (PLAVIX) 75 MG tablet Take 1 tablet (75 mg total) by mouth daily. 11/28/18   Angiulli, Lavon Paganini, PA-C  Docusate Calcium (STOOL SOFTENER PO) Take 100 mg by mouth daily.     [provider]  doxycycline (VIBRA-TABS) 100 MG tablet Take 1  tablet (100 mg total) by mouth every 12 (twelve) hours. 01/21/19   Loletha Grayer, MD  famotidine (PEPCID) 20 MG tablet Take 1 tablet (20 mg total) by mouth 2 (two) times daily. 11/28/18   Angiulli, Lavon Paganini, PA-C  fluconazole (DIFLUCAN) 150 MG tablet Take 1 tablet (150 mg total) by mouth daily. 02/02/19   Rudene Re, MD  lidocaine-prilocaine (EMLA) cream Apply to affected area once 02/06/19   Earlie Server, MD  Melatonin 3 MG TABS Take 3 mg by mouth at bedtime.    [provider]  mometasone-formoterol (DULERA) 100-5 MCG/ACT AERO Inhale 2 puffs into the lungs 2 times daily at 12 noon and 4 pm. 01/24/18   [provider]  mupirocin ointment (BACTROBAN) 2 % Apply topically daily. 02/15/19   Earlie Server, MD  nitroGLYCERIN (NITROSTAT) 0.4 MG SL tablet Place 0.4 mg under the tongue every 5 (five) minutes as needed for chest pain.    [provider]  ondansetron (ZOFRAN) 4 MG tablet Take 4 mg by mouth every 8 (eight) hours as needed for nausea or vomiting.    [provider]  oxybutynin (DITROPAN) 5 MG tablet Take 1 tablet (5 mg total) by mouth 2 (two) times daily. 11/28/18   Angiulli, Lavon Paganini, PA-C  oxyCODONE (OXY IR/ROXICODONE) 5 MG immediate release tablet Take 1-2 tablets (5-10 mg total) by mouth every 6 (six) hours as needed for severe pain. 01/31/19   Borders, Kirt Boys, NP  PARoxetine (PAXIL) 30 MG tablet Take 1 tablet (30 mg total) by mouth daily. 11/28/18   Angiulli, Lavon Paganini, PA-C  pregabalin (LYRICA) 150 MG capsule Take 1 capsule (150 mg total) by mouth at bedtime. 11/28/18   Angiulli, Lavon Paganini, PA-C  pregabalin (LYRICA) 75 MG capsule Take 1 capsule (75 mg total) by mouth 2 (two) times daily. 12/13/18   Mayo, Pete Pelt, MD  prochlorperazine (COMPAZINE) 10 MG tablet Take 1 tablet (10 mg total) by mouth every 6 (six) hours as needed (Nausea or vomiting). 02/06/19   Earlie Server, MD  rosuvastatin (CRESTOR) 20 MG tablet Take 1 tablet (20 mg total) by mouth at bedtime. 11/28/18    Angiulli, Lavon Paganini, PA-C  spironolactone (ALDACTONE) 25 MG tablet Take 0.5 tablets (12.5 mg total) by mouth daily. 11/28/18 11/28/19  Angiulli, Lavon Paganini, PA-C  venlafaxine XR (EFFEXOR-XR) 75 MG 24 hr capsule Take 75 mg by mouth daily with breakfast.    [provider]    Allergies Atorvastatin, Fluconazole, Sertraline, Sulfa antibiotics, Sulfamethoxazole-trimethoprim, and Sulfasalazine  Family History  Problem Relation Age of Onset   Heart disease Other    Hypertension Other    Rheum arthritis Mother    CAD Father     Social History Social History   Tobacco Use   Smoking status: Never Smoker   Smokeless tobacco: Never Used  Substance Use Topics   Alcohol use: No    Alcohol/week: 0.0 standard drinks   Drug use: No      Review of  Systems Constitutional: No fever/chills, + dizzyness, weakness, +fall Eyes: No visual changes. ENT: No sore throat. Cardiovascular: Denies chest pain. Respiratory: + SOB Gastrointestinal: No abdominal pain.  No nausea, no vomiting.  No diarrhea.  No constipation. Genitourinary: + dysuria Musculoskeletal: Negative for back pain. Skin: Negative for rash. Neurological: Negative for headaches, focal weakness or numbness. All other ROS negative ____________________________________________   PHYSICAL EXAM:  VITAL SIGNS: ED Triage Vitals  Enc Vitals Group     BP 03/05/19 1132 106/81     Pulse Rate 03/05/19 1132 (!) 114     Resp 03/05/19 1132 16     Temp 03/05/19 1132 98.2 F (36.8 C)     Temp Source 03/05/19 1132 Oral     SpO2 03/05/19 1132 93 %     Weight 03/05/19 1133 192 lb 14.4 oz (87.5 kg)     Height 03/05/19 1133 5\' 2"  (1.575 m)     Head Circumference --      Peak Flow --      Pain Score 03/05/19 1132 8     Pain Loc --      Pain Edu? --      Excl. in [place]? --     Constitutional: Alert and oriented. Well appearing and in no acute distress. Eyes: Conjunctivae are normal. EOMI. Head: Atraumatic. Nose: No  congestion/rhinnorhea. Mouth/Throat: Mucous membranes are moist.   Neck: No stridor. Trachea Midline. FROM Cardiovascular: tachycardiac, regular rhythm. Grossly normal heart sounds.  Good peripheral circulation. Respiratory: Normal respiratory effort.  No retractions. Lungs CTAB. Gastrointestinal: Soft and nontender. No distention. No abdominal bruits. Incision over her abdomen. Musculoskeletal: No lower extremity tenderness nor edema.  No joint effusions. Small abrasion on the left elbow. NV intact. Full ROM.  Mild tenderness on the left hip. Good distal pulses Neurologic:  Normal speech and language. No gross focal neurologic deficits are appreciated.  Skin:  Skin is warm, dry and intact. No rash noted. Psychiatric: Mood and affect are normal. Speech and behavior are normal. GU: Deferred   ____________________________________________   LABS (all labs ordered are listed, but only abnormal results are displayed)  Labs Reviewed  CBC - Abnormal; Notable for the following components:      Result Value   RBC 3.50 (*)    Hemoglobin 10.8 (*)    HCT 33.2 (*)    All other components within normal limits  URINALYSIS, COMPLETE (UACMP) WITH MICROSCOPIC - Abnormal; Notable for the following components:   Color, Urine YELLOW (*)    APPearance CLOUDY (*)    Specific Gravity, Urine 1.043 (*)    Hgb urine dipstick MODERATE (*)    Leukocytes,Ua LARGE (*)    WBC, UA >50 (*)    Bacteria, UA FEW (*)    All other components within normal limits  SARS CORONAVIRUS 2 (HOSPITAL ORDER, [facility])  URINE CULTURE  BASIC METABOLIC PANEL   ____________________________________________   ED ECG REPORT I, Vanessa Bangor, the attending physician, personally viewed and interpreted this ECG.  Sinus tachycardiac rate of 115, no st elevation, no twi, normal intervals ____________________________________________  RADIOLOGY Robert Bellow, personally viewed and evaluated these  images (plain radiographs) as part of my medical decision making, as well as reviewing the written report by the radiologist.  ED MD interpretation:  Xray no fracture  Official radiology report(s): Dg Elbow 2 Views Left  Result Date: 03/05/2019 CLINICAL DATA:  Fall EXAM: LEFT ELBOW - 2 VIEW COMPARISON:  None. FINDINGS:  There is no evidence of fracture, dislocation, or joint effusion. There is no evidence of arthropathy or other focal bone abnormality. Soft tissues are unremarkable. IMPRESSION: Negative. Electronically Signed   By: Rolm Baptise M.D.   On: 03/05/2019 12:32   Ct Head Wo Contrast  Result Date: 03/05/2019 CLINICAL DATA:  Recent fall with headaches and neck pain, initial encounter EXAM: CT HEAD WITHOUT CONTRAST CT CERVICAL SPINE WITHOUT CONTRAST TECHNIQUE: Multidetector CT imaging of the head and cervical spine was performed following the standard protocol without intravenous contrast. Multiplanar CT image reconstructions of the cervical spine were also generated. COMPARISON:  12/14/2018 FINDINGS: CT HEAD FINDINGS Brain: Atrophic changes and chronic white matter ischemic changes are identified similar to that seen on prior exam with multiple areas of prior infarct identified. These are stable in appearance from the prior exam. No findings to suggest acute hemorrhage, acute infarction or space-occupying mass lesion are noted. Cavum septum pellucidum is again noted. Vascular: No hyperdense vessel or unexpected calcification. Skull: Normal. Negative for fracture or focal lesion. Sinuses/Orbits: No acute finding. Other: None. CT CERVICAL SPINE FINDINGS Alignment: Within normal limits. Skull base and vertebrae: 7 cervical segments are well visualized. Vertebral body height is well maintained. Multilevel facet hypertrophic changes are noted. No acute fracture or acute facet abnormality seen. Soft tissues and spinal canal: Surrounding soft tissue structures show enlargement of the thyroid consistent  with underlying goiter. This is stable from the prior exam. No other focal abnormality is seen. Upper chest: Visualized lung apices are within normal limits. Other: Right chest wall port is noted. IMPRESSION: CT of the head: Chronic atrophic and ischemic changes similar to that noted on the prior exam. No acute abnormality noted. CT of the cervical spine: Multilevel degenerative change without acute abnormality. Electronically Signed   By: Inez Catalina M.D.   On: 03/05/2019 14:10   Ct Angio Chest Pe W And/or Wo Contrast  Result Date: 03/05/2019 CLINICAL DATA:  Chest pain. Recent fall. History of ovarian carcinoma EXAM: CT ANGIOGRAPHY CHEST WITH CONTRAST TECHNIQUE: Multidetector CT imaging of the chest was performed using the standard protocol during bolus administration of intravenous contrast. Multiplanar CT image reconstructions and MIPs were obtained to evaluate the vascular anatomy. CONTRAST:  78mL OMNIPAQUE IOHEXOL 350 MG/ML SOLN COMPARISON:  Chest CT January 31, 2019 FINDINGS: Cardiovascular: There is no demonstrable pulmonary embolus. There is no appreciable thoracic aortic aneurysm or dissection. Visualized great vessels appear normal except for slight calcification in the proximal left subclavian artery. There are foci of aortic atherosclerosis and foci of coronary artery calcification. There is no pericardial effusion or pericardial thickening. Port-A-Cath tip is in the superior cava. Mediastinum/Nodes: There is thyroid enlargement with multiple nodular lesions in the thyroid, unchanged. No dominant thyroid mass evident. There is no appreciable thoracic adenopathy. No esophageal lesions are evident. Lungs/Pleura: There is a fairly small free-flowing pleural effusion on the right. There is atelectatic change in the lung bases. There is no evident edema or consolidation. No parenchymal lung mass. Upper Abdomen: There is upper abdominal aortic atherosclerosis. Subcentimeter foci of increased attenuation in  the omentum are stable compared to recent study and may represent small metastatic foci given history of ovarian carcinoma. Musculoskeletal: There is degenerative change in the thoracic spine. No blastic or lytic bone lesions. Port-A-Cath port is in the anterior right supraclavicular region superficially. Review of the MIP images confirms the above findings. IMPRESSION: 1. No demonstrable pulmonary embolus. No thoracic aortic aneurysm or dissection. There is aortic atherosclerosis as  well as foci of great vessel and coronary artery calcification. 2. Fairly small free-flowing right pleural effusion. Bibasilar atelectasis. No consolidation. No pulmonary nodular lesions evident. 3.  Prominent thyroid with multinodular goiter. 4.  No evident thoracic adenopathy. 5. 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 6.  Port-A-Cath tip in superior vena cava. Aortic Atherosclerosis (ICD10-I70.0). Electronically Signed   By: Lowella Grip III M.D.   On: 03/05/2019 14:10   Ct Cervical Spine Wo Contrast  Result Date: 03/05/2019 CLINICAL DATA:  Recent fall with headaches and neck pain, initial encounter EXAM: CT HEAD WITHOUT CONTRAST CT CERVICAL SPINE WITHOUT CONTRAST TECHNIQUE: Multidetector CT imaging of the head and cervical spine was performed following the standard protocol without intravenous contrast. Multiplanar CT image reconstructions of the cervical spine were also generated. COMPARISON:  12/14/2018 FINDINGS: CT HEAD FINDINGS Brain: Atrophic changes and chronic white matter ischemic changes are identified similar to that seen on prior exam with multiple areas of prior infarct identified. These are stable in appearance from the prior exam. No findings to suggest acute hemorrhage, acute infarction or space-occupying mass lesion are noted. Cavum septum pellucidum is again noted. Vascular: No hyperdense vessel or unexpected calcification. Skull: Normal. Negative for fracture  or focal lesion. Sinuses/Orbits: No acute finding. Other: None. CT CERVICAL SPINE FINDINGS Alignment: Within normal limits. Skull base and vertebrae: 7 cervical segments are well visualized. Vertebral body height is well maintained. Multilevel facet hypertrophic changes are noted. No acute fracture or acute facet abnormality seen. Soft tissues and spinal canal: Surrounding soft tissue structures show enlargement of the thyroid consistent with underlying goiter. This is stable from the prior exam. No other focal abnormality is seen. Upper chest: Visualized lung apices are within normal limits. Other: Right chest wall port is noted. IMPRESSION: CT of the head: Chronic atrophic and ischemic changes similar to that noted on the prior exam. No acute abnormality noted. CT of the cervical spine: Multilevel degenerative change without acute abnormality. Electronically Signed   By: Inez Catalina M.D.   On: 03/05/2019 14:10   Dg Hip Unilat W Or Wo Pelvis 2-3 Views Left  Result Date: 03/05/2019 CLINICAL DATA:  Fall EXAM: DG HIP (WITH OR WITHOUT PELVIS) 2-3V LEFT COMPARISON:  None. FINDINGS: Hip joints and SI joints are symmetric and unremarkable. No acute bony abnormality. Specifically, no fracture, subluxation, or dislocation. Overlying the lateral soft tissue swelling, likely soft tissue hematoma. IMPRESSION: No acute bony abnormality. Overlying lateral soft tissue swelling/hematoma. Electronically Signed   By: Rolm Baptise M.D.   On: 03/05/2019 12:31    ____________________________________________   PROCEDURES  Procedure(s) performed (including Critical Care):  Procedures   ____________________________________________   INITIAL IMPRESSION / ASSESSMENT AND PLAN / ED COURSE  Destiny [name] was evaluated in Emergency Department on 03/05/2019 for the symptoms described in the history of present illness. She was evaluated in the context of the global COVID-19 pandemic, which necessitated consideration that  the patient might be at risk for infection with the SARS-CoV-2 virus that causes COVID-19. Institutional protocols and algorithms that pertain to the evaluation of patients at risk for COVID-19 are in a state of rapid change based on information released by regulatory bodies including the CDC and federal and state organizations. These policies and algorithms were followed during the patient's care in the ED.    Pt presents with fall. Will get CT head to rule out ICH. CT cervical to rule out fracture. Given the HR >100, in  setting of cancer with worsening SOB will get CT PE to rule out PE.  No abd pain to suggest abd injury. Will get UA to evaluate for UTI. No chest pain to suggest ACS.   On review of records it appears that patient has some baseline elevated heart rates.  White count is normal no evidence of infection.  Hemoglobin is stable around baseline.  CT head and neck are negative.  CT angios no evidence of PE.  X-rays negative for fracture.  Patient does have some swelling on the left hip but no obvious fracture.  3:48 PM patient's urine is consistent with concerns for UTI.  Patient is having dysuria.  Attempted to ambulate patient but she is unable to stand up but very wobbly on her feet, therefore I have low suspicion difficult walking from occult fracture.. Per husband she has been like this the last few days where it is harder for her to ambulate and that he does not feel like she is safe at home. Will discuss with the hospital team for admission for UTI treatment as well as PT OT.  Admit to the hospital       ____________________________________________   FINAL CLINICAL IMPRESSION(S) / ED DIAGNOSES   Final diagnoses:  Lower urinary tract infectious disease  Fall, initial encounter      MEDICATIONS GIVEN DURING THIS VISIT:  Medications  cefTRIAXone (ROCEPHIN) 1 g in sodium chloride 0.9 % 100 mL IVPB (has no administration in time range)  Tdap (BOOSTRIX) injection 0.5 mL  (has no administration in time range)  sodium chloride flush (NS) 0.9 % injection 3 mL (3 mLs Intravenous Given 03/05/19 1206)  sodium chloride 0.9 % bolus 1,000 mL (0 mLs Intravenous Stopped 03/05/19 1512)  acetaminophen (TYLENOL) tablet 650 mg (650 mg Oral Given 03/05/19 1308)  iohexol (OMNIPAQUE) 350 MG/ML injection 75 mL (75 mLs Intravenous Contrast Given 03/05/19 1349)  cefTRIAXone (ROCEPHIN) 1 g in sodium chloride 0.9 % 100 mL IVPB (0 g Intravenous Stopped 03/05/19 1707)     ED Discharge Orders    None       Note:  This document was prepared using Dragon voice recognition software and may include unintentional dictation errors.   Vanessa [place], MD 03/05/19 [phone]

## 2019-03-05 NOTE — ED Notes (Signed)
Pt called out reporting pain in her left elbow and requesting medication for the pain. MD made aware.

## 2019-03-05 NOTE — ED Notes (Signed)
This EDT and Dr. Jari Pigg ssisted pt to the side of the bed and attempted to ambulate. Pt was unable to ambulate, and was assisted back to bed.

## 2019-03-05 NOTE — ED Notes (Signed)
Pt resting on stretcher with lights off to enhance rest. No increased work of breathing or acute distress noted at his time. Provided for comfort and safety and will continue to assess.

## 2019-03-06 LAB — BASIC METABOLIC PANEL
Anion gap: 9 (ref 5–15)
BUN: 10 mg/dL (ref 8–23)
CO2: 24 mmol/L (ref 22–32)
Calcium: 8.7 mg/dL — ABNORMAL LOW (ref 8.9–10.3)
Chloride: 104 mmol/L (ref 98–111)
Creatinine, Ser: 0.69 mg/dL (ref 0.44–1.00)
GFR calc Af Amer: >60 mL/min (ref >60)
GFR calc non Af Amer: >60 mL/min (ref >60)
Glucose, Bld: 97 mg/dL (ref 70–99)
Potassium: 3.4 mmol/L — ABNORMAL LOW (ref 3.5–5.1)
Sodium: 137 mmol/L (ref 135–145)

## 2019-03-06 LAB — CBC
HCT: 30.7 % — ABNORMAL LOW (ref 36.0–46.0)
Hemoglobin: 9.9 g/dL — ABNORMAL LOW (ref 12.0–15.0)
MCH: 30.9 pg (ref 26.0–34.0)
MCHC: 32.2 g/dL (ref 30.0–36.0)
MCV: 95.9 fL (ref 80.0–100.0)
Platelets: 184 10*3/uL (ref 150–400)
RBC: 3.2 MIL/uL — ABNORMAL LOW (ref 3.87–5.11)
RDW: 13.7 % (ref 11.5–15.5)
WBC: 5.8 10*3/uL (ref 4.0–10.5)
nRBC: 0 % (ref 0.0–0.2)

## 2019-03-06 MED ORDER — DOCUSATE SODIUM 100 MG PO CAPS
100.0000 mg | ORAL_CAPSULE | Freq: Two times a day (BID) | ORAL | Status: DC
  Administered 2019-03-06 – 2019-03-07 (×3): 100 mg via ORAL
  Filled 2019-03-06 (×3): qty 1

## 2019-03-06 MED ORDER — POTASSIUM CHLORIDE 20 MEQ/15ML (10%) PO SOLN
40.0000 meq | Freq: Once | ORAL | Status: AC
  Administered 2019-03-06: 40 meq via ORAL
  Filled 2019-03-06: qty 30

## 2019-03-06 MED ORDER — POLYETHYLENE GLYCOL 3350 17 G PO PACK
17.0000 g | PACK | Freq: Every day | ORAL | Status: DC
  Administered 2019-03-07: 17 g via ORAL
  Filled 2019-03-06 (×2): qty 1

## 2019-03-06 MED ORDER — ROSUVASTATIN CALCIUM 10 MG PO TABS
20.0000 mg | ORAL_TABLET | Freq: Every day | ORAL | Status: DC
  Administered 2019-03-06 (×2): 20 mg via ORAL
  Filled 2019-03-06: qty 2
  Filled 2019-03-06: qty 1
  Filled 2019-03-06: qty 2

## 2019-03-06 MED ORDER — ASPIRIN 81 MG PO CHEW
81.0000 mg | CHEWABLE_TABLET | Freq: Every day | ORAL | Status: DC
  Administered 2019-03-06 – 2019-03-07 (×2): 81 mg via ORAL
  Filled 2019-03-06 (×2): qty 1

## 2019-03-06 MED ORDER — LEVOFLOXACIN IN D5W 750 MG/150ML IV SOLN
750.0000 mg | INTRAVENOUS | Status: DC
  Administered 2019-03-06 – 2019-03-07 (×2): 750 mg via INTRAVENOUS
  Filled 2019-03-06 (×2): qty 150

## 2019-03-06 NOTE — Progress Notes (Signed)
Initial Nutrition Assessment  DOCUMENTATION CODES:   Obesity unspecified  INTERVENTION:  Recommend liberalizing diet to 2 gram sodium.  Provide Magic cup TID with meals, each supplement provides 290 kcal and 9 grams of protein.  NUTRITION DIAGNOSIS:   Increased nutrient needs related to catabolic illness(stage IV high-grade ovarian serous carcinoma) as evidenced by estimated needs.  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   Patient on contact precautions. Spoke with her over the phone. She reports her appetite has improved overall. She only eats one meal per day. Her husband prepares the food at home. She does not typically eat breakfast or lunch. For dinner she may have meat with mashed potatoes or another side. She reports she no longer needs a mechanical soft diet as she is doing well with chewing. She ate 50% of her breakfast today and reports she ate well at lunch. She does not like many oral nutrition supplements. She has tried [employer], [employer], [employer] and reports she does not like any of them. She is amenable to trying [employer].  Patient reports her weight has stabilized recently and she is not losing weight anymore. Weights had previously been as high as 103.9 kg in January, but unsure if that was related to fluid.  Medications reviewed and include: vitamin D3 5000 units daily, Colace 100 mg BID, famotidine, Miralax 17 grams daily, Crestor, spironolactone 12.5 mg daily, levofloxacin.  Labs reviewed: Potassium 3.4.  NUTRITION - FOCUSED PHYSICAL EXAM:  Unable  to complete at this time.  Diet Order:   Diet Order            Diet Heart Room service appropriate? Yes; Fluid consistency: Thin  Diet effective now             EDUCATION NEEDS:   No education needs have been identified at this time  Skin:  Skin Assessment: Reviewed RN Assessment  Last BM:  03/04/2019 per chart  Height:   Ht Readings from Last 1 Encounters:  03/05/19 5\' 2"  (1.575 m)   Weight:   Wt Readings from Last 1 Encounters:  03/05/19 87.5 kg   Ideal Body Weight:  50 kg  BMI:  Body mass index is 35.28 kg/m.  Estimated Nutritional Needs:   Kcal:  2800-3491  Protein:  95-105 grams  Fluid:  1.5 L/day  Willey Blade, MS, RD, LDN Office: [phone] Pager: [phone] After Hours/Weekend Pager: [phone]

## 2019-03-06 NOTE — Progress Notes (Addendum)
[facility] [place] NAME: Destiny [name]    MR#:  [id number]  DATE OF BIRTH:  12/11/1955  SUBJECTIVE:   Patient presents the emergency room due to weakness and falls.  She is found to have urinary tract infection.  Patient reports generalized weakness.  She denies dizziness or lightheadedness or chest pain. Denies dysuria, frequency urgency   She has chronic left sided weakness REVIEW OF SYSTEMS:    Review of Systems  Constitutional: Negative for fever, chills weight loss HENT: Negative for ear pain, nosebleeds, congestion, facial swelling, rhinorrhea, neck pain, neck stiffness and ear discharge.   Respiratory: Negative for cough, shortness of breath, wheezing  Cardiovascular: Negative for chest pain, palpitations and leg swelling.  Gastrointestinal: Negative for heartburn, abdominal pain, vomiting, diarrhea or consitpation Genitourinary: Negative for dysuria, urgency, frequency, hematuria Musculoskeletal: Negative for back pain or joint pain Chronic left-sided weakness Neurological: Negative for dizziness, seizures, syncope, focal weakness,  numbness and headaches.  Hematological: Does not bruise/bleed easily.  Psychiatric/Behavioral: Negative for hallucinations, confusion, dysphoric mood    Tolerating Diet: yes      DRUG ALLERGIES:   Allergies  Allergen Reactions  . Atorvastatin Hives    No s/sx of anaphylaxis, just intermittent hives.  Does not seem to be a class effect as she has tolerated lovastatin previously No s/sx of anaphylaxis, just intermittent hives.  Does not seem to be a class effect as she has tolerated lovastatin previously  . Fluconazole Rash    Reaction to generic but not name brand Reaction to generic but not name brand CAN USE DIFLUCAN.DOES NOT BREAK OUT WITH DIFLUCAN.(allergic only to generic brand)  . Sertraline Itching  . Sulfa Antibiotics Rash and Other (See Comments)    "hot " sensation  .  Sulfamethoxazole-Trimethoprim Rash and Other (See Comments)    "felt hot" Other reaction(s): Other (See Comments) "felt hot"  . Sulfasalazine Other (See Comments) and Rash    "hot " sensation    VITALS:  Blood pressure 105/71, pulse (!) 108, temperature 98.4 F (36.9 C), temperature source Oral, resp. rate 18, height 5\' 2"  (1.575 m), weight 87.5 kg, SpO2 100 %.  PHYSICAL EXAMINATION:  Constitutional: Appears well-developed and well-nourished. No distress. HENT: Normocephalic. Marland Kitchen Oropharynx is clear and moist.  Eyes: Conjunctivae and EOM are normal. PERRLA, no scleral icterus.  Neck: Normal ROM. Neck supple. No JVD. No tracheal deviation. CVS: RRR, S1/S2 +, no murmurs, no gallops, no carotid bruit.  Pulmonary: Effort and breath sounds normal, no stridor, rhonchi, wheezes, rales.  Abdominal: Soft. BS +,  no distension, tenderness, rebound or guarding.  Musculoskeletal: Normal range of motion. No edema and no tenderness.  LUE 4/5 LLE 5-/5 Neuro: Alert. CN 2-12 grossly intact. No focal deficits. Skin: Skin is warm and dry. No rash noted. Psychiatric: Normal mood and affect.      LABORATORY PANEL:   CBC Recent Labs  Lab 03/06/19 0415  WBC 5.8  HGB 9.9*  HCT 30.7*  PLT 184   ------------------------------------------------------------------------------------------------------------------  Chemistries  Recent Labs  Lab 03/06/19 0415  NA 137  K 3.4*  CL 104  CO2 24  GLUCOSE 97  BUN 10  CREATININE 0.69  CALCIUM 8.7*   ------------------------------------------------------------------------------------------------------------------  Cardiac Enzymes No results for input(s): TROPONINI in the last 168 hours. ------------------------------------------------------------------------------------------------------------------  RADIOLOGY:  Dg Elbow 2 Views Left  Result Date: 03/05/2019 CLINICAL DATA:  Fall EXAM: LEFT ELBOW - 2 VIEW COMPARISON:  None. FINDINGS: There is no  evidence of fracture, dislocation, or joint effusion. There is no evidence of arthropathy or other focal bone abnormality. Soft tissues are unremarkable. IMPRESSION: Negative. Electronically Signed   By: Rolm Baptise M.D.   On: 03/05/2019 12:32   Ct Head Wo Contrast  Result Date: 03/05/2019 CLINICAL DATA:  Recent fall with headaches and neck pain, initial encounter EXAM: CT HEAD WITHOUT CONTRAST CT CERVICAL SPINE WITHOUT CONTRAST TECHNIQUE: Multidetector CT imaging of the head and cervical spine was performed following the standard protocol without intravenous contrast. Multiplanar CT image reconstructions of the cervical spine were also generated. COMPARISON:  12/14/2018 FINDINGS: CT HEAD FINDINGS Brain: Atrophic changes and chronic white matter ischemic changes are identified similar to that seen on prior exam with multiple areas of prior infarct identified. These are stable in appearance from the prior exam. No findings to suggest acute hemorrhage, acute infarction or space-occupying mass lesion are noted. Cavum septum pellucidum is again noted. Vascular: No hyperdense vessel or unexpected calcification. Skull: Normal. Negative for fracture or focal lesion. Sinuses/Orbits: No acute finding. Other: None. CT CERVICAL SPINE FINDINGS Alignment: Within normal limits. Skull base and vertebrae: 7 cervical segments are well visualized. Vertebral body height is well maintained. Multilevel facet hypertrophic changes are noted. No acute fracture or acute facet abnormality seen. Soft tissues and spinal canal: Surrounding soft tissue structures show enlargement of the thyroid consistent with underlying goiter. This is stable from the prior exam. No other focal abnormality is seen. Upper chest: Visualized lung apices are within normal limits. Other: Right chest wall port is noted. IMPRESSION: CT of the head: Chronic atrophic and ischemic changes similar to that noted on the prior exam. No acute abnormality noted. CT of the  cervical spine: Multilevel degenerative change without acute abnormality. Electronically Signed   By: Inez Catalina M.D.   On: 03/05/2019 14:10   Ct Angio Chest Pe W And/or Wo Contrast  Result Date: 03/05/2019 CLINICAL DATA:  Chest pain. Recent fall. History of ovarian carcinoma EXAM: CT ANGIOGRAPHY CHEST WITH CONTRAST TECHNIQUE: Multidetector CT imaging of the chest was performed using the standard protocol during bolus administration of intravenous contrast. Multiplanar CT image reconstructions and MIPs were obtained to evaluate the vascular anatomy. CONTRAST:  56mL OMNIPAQUE IOHEXOL 350 MG/ML SOLN COMPARISON:  Chest CT January 31, 2019 FINDINGS: Cardiovascular: There is no demonstrable pulmonary embolus. There is no appreciable thoracic aortic aneurysm or dissection. Visualized great vessels appear normal except for slight calcification in the proximal left subclavian artery. There are foci of aortic atherosclerosis and foci of coronary artery calcification. There is no pericardial effusion or pericardial thickening. Port-A-Cath tip is in the superior cava. Mediastinum/Nodes: There is thyroid enlargement with multiple nodular lesions in the thyroid, unchanged. No dominant thyroid mass evident. There is no appreciable thoracic adenopathy. No esophageal lesions are evident. Lungs/Pleura: There is a fairly small free-flowing pleural effusion on the right. There is atelectatic change in the lung bases. There is no evident edema or consolidation. No parenchymal lung mass. Upper Abdomen: There is upper abdominal aortic atherosclerosis. Subcentimeter foci of increased attenuation in the omentum are stable compared to recent study and may represent small metastatic foci given history of ovarian carcinoma. Musculoskeletal: There is degenerative change in the thoracic spine. No blastic or lytic bone lesions. Port-A-Cath port is in the anterior right supraclavicular region superficially. Review of the MIP images confirms  the above findings. IMPRESSION: 1. No demonstrable pulmonary embolus. No thoracic aortic aneurysm or dissection. There is aortic atherosclerosis as well as foci  of great vessel and coronary artery calcification. 2. Fairly small free-flowing right pleural effusion. Bibasilar atelectasis. No consolidation. No pulmonary nodular lesions evident. 3.  Prominent thyroid with multinodular goiter. 4.  No evident thoracic adenopathy. 5. 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 6.  Port-A-Cath tip in superior vena cava. Aortic Atherosclerosis (ICD10-I70.0). Electronically Signed   By: Lowella Grip III M.D.   On: 03/05/2019 14:10   Ct Cervical Spine Wo Contrast  Result Date: 03/05/2019 CLINICAL DATA:  Recent fall with headaches and neck pain, initial encounter EXAM: CT HEAD WITHOUT CONTRAST CT CERVICAL SPINE WITHOUT CONTRAST TECHNIQUE: Multidetector CT imaging of the head and cervical spine was performed following the standard protocol without intravenous contrast. Multiplanar CT image reconstructions of the cervical spine were also generated. COMPARISON:  12/14/2018 FINDINGS: CT HEAD FINDINGS Brain: Atrophic changes and chronic white matter ischemic changes are identified similar to that seen on prior exam with multiple areas of prior infarct identified. These are stable in appearance from the prior exam. No findings to suggest acute hemorrhage, acute infarction or space-occupying mass lesion are noted. Cavum septum pellucidum is again noted. Vascular: No hyperdense vessel or unexpected calcification. Skull: Normal. Negative for fracture or focal lesion. Sinuses/Orbits: No acute finding. Other: None. CT CERVICAL SPINE FINDINGS Alignment: Within normal limits. Skull base and vertebrae: 7 cervical segments are well visualized. Vertebral body height is well maintained. Multilevel facet hypertrophic changes are noted. No acute fracture or acute facet abnormality seen. Soft  tissues and spinal canal: Surrounding soft tissue structures show enlargement of the thyroid consistent with underlying goiter. This is stable from the prior exam. No other focal abnormality is seen. Upper chest: Visualized lung apices are within normal limits. Other: Right chest wall port is noted. IMPRESSION: CT of the head: Chronic atrophic and ischemic changes similar to that noted on the prior exam. No acute abnormality noted. CT of the cervical spine: Multilevel degenerative change without acute abnormality. Electronically Signed   By: Inez Catalina M.D.   On: 03/05/2019 14:10   Dg Hip Unilat W Or Wo Pelvis 2-3 Views Left  Result Date: 03/05/2019 CLINICAL DATA:  Fall EXAM: DG HIP (WITH OR WITHOUT PELVIS) 2-3V LEFT COMPARISON:  None. FINDINGS: Hip joints and SI joints are symmetric and unremarkable. No acute bony abnormality. Specifically, no fracture, subluxation, or dislocation. Overlying the lateral soft tissue swelling, likely soft tissue hematoma. IMPRESSION: No acute bony abnormality. Overlying lateral soft tissue swelling/hematoma. Electronically Signed   By: Rolm Baptise M.D.   On: 03/05/2019 12:31     ASSESSMENT AND PLAN:   63 year old female with history of ovarian cancer and previous history of multiple UTIs who presented to the emergency room due to falls and generalized weakness.  1.  Generalized weakness in the setting of falls due to urinary tract infection with history of VRE/Klebsiella in the past Patient on Levaquin until urine culture sensitivities are back.  2.  Generalized weakness with recurrent falls: Physical therapy consultation requested for discharge planning  3.  History of stroke with some residual left-sided weakness: Continue ASA/Plavix/statin  4. Hypokalemia: Replete  5. stage IV high-grade serous serous carcinoma   Chemo planned for Friday in the hospital   Management plans discussed with the patient and she is in agreement.  CODE STATUS: FULL  TOTAL  TIME TAKING CARE OF THIS PATIENT: 30 minutes.     POSSIBLE D/C 1-2 days, DEPENDING ON CLINICAL CONDITION.   Bettey Costa M.D on  03/06/2019 at 11:09 AM  Between 7am to 6pm - Pager - [phone] After 6pm go to [email or web address] - password [email or web address] [facility]  Office  [phone]  CC: Primary care physician; [name], MD  Note: This dictation was prepared with Dragon dictation along with smaller phrase technology. Any transcriptional errors that result from this process are unintentional.

## 2019-03-06 NOTE — Consult Note (Signed)
Pharmacy Antibiotic Note  Destiny [name] is a 63 y.o. female admitted on 03/05/2019 with UTI.  Pharmacy has been consulted for Levaquin dosing. Patient has extensive history of UTIs in the past. Previous Ucx organisms/bacteria include Klebsiella and VRE. Will treat empirically until Ucx results and susceptibilities return.  Plan: Levaquin 750mg  IV Q24 hours  Height: 5\' 2"  (157.5 cm) Weight: 192 lb 14.4 oz (87.5 kg) IBW/kg (Calculated) : 50.1  Temp (24hrs), Avg:98.3 F (36.8 C), Min:98.2 F (36.8 C), Max:98.4 F (36.9 C)  Recent Labs  Lab 03/05/19 1142 03/06/19 0415  WBC 7.0 5.8  CREATININE 0.87 0.69    Estimated Creatinine Clearance: 73 mL/min (by C-G formula based on SCr of 0.69 mg/dL).    Allergies  Allergen Reactions  . Atorvastatin Hives    No s/sx of anaphylaxis, just intermittent hives.  Does not seem to be a class effect as she has tolerated lovastatin previously No s/sx of anaphylaxis, just intermittent hives.  Does not seem to be a class effect as she has tolerated lovastatin previously  . Fluconazole Rash    Reaction to generic but not name brand Reaction to generic but not name brand CAN USE DIFLUCAN.DOES NOT BREAK OUT WITH DIFLUCAN.(allergic only to generic brand)  . Sertraline Itching  . Sulfa Antibiotics Rash and Other (See Comments)    "hot " sensation  . Sulfamethoxazole-Trimethoprim Rash and Other (See Comments)    "felt hot" Other reaction(s): Other (See Comments) "felt hot"  . Sulfasalazine Other (See Comments) and Rash    "hot " sensation    Antimicrobials this admission: Levaquin 8/19 >>  Rocephin 8/18 x 1    Microbiology results: 8/18 UCx: pending   Thank you for allowing pharmacy to be a part of this patient's care.  Cruise Baumgardner A Meosha Castanon 03/06/2019 9:04 AM

## 2019-03-07 LAB — URINE CULTURE: Culture: >=100000 — AB

## 2019-03-07 LAB — BASIC METABOLIC PANEL
Anion gap: 9 (ref 5–15)
BUN: 9 mg/dL (ref 8–23)
CO2: 24 mmol/L (ref 22–32)
Calcium: 9.2 mg/dL (ref 8.9–10.3)
Chloride: 105 mmol/L (ref 98–111)
Creatinine, Ser: 0.9 mg/dL (ref 0.44–1.00)
GFR calc Af Amer: >60 mL/min (ref >60)
GFR calc non Af Amer: >60 mL/min (ref >60)
Glucose, Bld: 119 mg/dL — ABNORMAL HIGH (ref 70–99)
Potassium: 3.5 mmol/L (ref 3.5–5.1)
Sodium: 138 mmol/L (ref 135–145)

## 2019-03-07 MED ORDER — LEVOFLOXACIN 500 MG PO TABS: 500.0000 mg | ORAL_TABLET | Freq: Every day | ORAL | 0 refills | Status: AC

## 2019-03-07 NOTE — Evaluation (Signed)
Physical Therapy Evaluation Patient Details Name: Destiny [name] MRN: [id number] DOB: 1956/02/23 Today's Date: 03/07/2019   History of Present Illness  Pt admitted for complaints of weakness and now diagnosed with UTI. Pt with previous history includes CVA with L side residual weakness, ovarian cancer currently on chemo, CAD, and HTN. Pt reports multiple falls recently  Clinical Impression  Pt is a pleasant 63 year old female who was admitted for UTI. Pt performs bed mobility, transfers, and ambulation with min assist and RW. Pt demonstrates deficits with strength (L hemibody), balance, and endurance. Pt is close to baseline level. Husband in room agrees. Pt is high falls risk without hands on assist, discussed purchasing gait belt for family use to improve safety/decrease falls. HR elevated throughout session ranging from 115-140s bpm, no complaints. Would benefit from skilled PT to address above deficits and promote optimal return to PLOF. Recommend transition to [place] upon discharge from acute hospitalization.     Follow Up Recommendations Home health PT;Supervision/Assistance - 24 hour    Equipment Recommendations  None recommended by PT    Recommendations for Other Services       Precautions / Restrictions Precautions Precautions: Fall Restrictions Weight Bearing Restrictions: No      Mobility  Bed Mobility Overal bed mobility: Needs Assistance Bed Mobility: Supine to Sit     Supine to sit: Min assist     General bed mobility comments: needs assist for hand position and sliding B LEs off bed. Follows commands well  Transfers Overall transfer level: Needs assistance Equipment used: Rolling walker (2 wheeled) Transfers: Sit to/from Stand Sit to Stand: Min assist         General transfer comment: needs cues for hand placement. Once standing, upright posture noted.  Ambulation/Gait Ambulation/Gait assistance: Min assist Gait Distance (Feet): 40 Feet Assistive  device: Rolling walker (2 wheeled) Gait Pattern/deviations: Step-to pattern     General Gait Details: ambulated to recliner and then further in room. Needs cues for sequencing. Decreased step length, slow speed. Unsteady with gait with narrow BOS. Needs hands on assist at all times  Stairs            Wheelchair Mobility    Modified Rankin (Stroke Patients Only)       Balance Overall balance assessment: Needs assistance;History of Falls Sitting-balance support: Feet supported Sitting balance-Leahy Scale: Fair     Standing balance support: Bilateral upper extremity supported Standing balance-Leahy Scale: Fair                               Pertinent Vitals/Pain Pain Assessment: Faces Faces Pain Scale: Hurts a little bit Pain Location: L elbow/shoulder/hip pain  Pain Descriptors / Indicators: Discomfort;Dull Pain Intervention(s): Limited activity within patient's tolerance;Repositioned    Home Living Family/patient expects to be discharged to:: Private residence Living Arrangements: Spouse/significant other Available Help at Discharge: Family;Available 24 hours/day Type of Home: Apartment Home Access: Level entry     Home Layout: One level Home Equipment: Walker - 4 wheels;Bedside commode;Shower seat;Grab bars - tub/shower;Wheelchair - [employer] - 2 wheels      Prior Function Level of Independence: Needs assistance         Comments: husband reports he assists with bed mobility, uses lift chair for transfers, uses RW/WC for mobility.  Reports multiple falls recently.     Hand Dominance        Extremity/Trunk Assessment   Upper Extremity Assessment Upper Extremity  Assessment: Generalized weakness(L UE grossly 3+/5; R UE grossly 4-/5)    Lower Extremity Assessment Lower Extremity Assessment: Generalized weakness(L LE grossly 3+/5; R LE grossly 4-/5)       Communication   Communication: No difficulties;HOH  Cognition  Arousal/Alertness: Awake/alert Behavior During Therapy: WFL for tasks assessed/performed Overall Cognitive Status: Within Functional Limits for tasks assessed                                 General Comments: slightly impaired safety/judgement      General Comments      Exercises Other Exercises Other Exercises: supine ther-ex performed on B LE including AP, quad sets, SLRs, and hip abd/add, 10 reps with fatigue present on L LE. CUes for sequencing, supervision. Reports pain   Assessment/Plan    PT Assessment Patient needs continued PT services  PT Problem List Decreased strength;Decreased activity tolerance;Decreased balance;Decreased mobility;Decreased safety awareness;Pain       PT Treatment Interventions DME instruction;Gait training;Therapeutic exercise;Balance training    PT Goals (Current goals can be found in the Care Plan section)  Acute Rehab PT Goals Patient Stated Goal: to go home PT Goal Formulation: With patient Time For Goal Achievement: 03/21/19 Potential to Achieve Goals: Good    Frequency Min 2X/week   Barriers to discharge        Co-evaluation               AM-PAC PT "6 Clicks" Mobility  Outcome Measure Help needed turning from your back to your side while in a flat bed without using bedrails?: None Help needed moving from lying on your back to sitting on the side of a flat bed without using bedrails?: A Little Help needed moving to and from a bed to a chair (including a wheelchair)?: A Little Help needed standing up from a chair using your arms (e.g., wheelchair or bedside chair)?: A Little Help needed to walk in hospital room?: A Little Help needed climbing 3-5 steps with a railing? : A Lot 6 Click Score: 18    End of Session Equipment Utilized During Treatment: Gait belt Activity Tolerance: Patient tolerated treatment well Patient left: in chair;with chair alarm set Nurse Communication: Mobility status PT Visit Diagnosis:  Unsteadiness on feet (R26.81);Repeated falls (R29.6);Muscle weakness (generalized) (M62.81);History of falling (Z91.81);Difficulty in walking, not elsewhere classified (R26.2);Pain Pain - Right/Left: Left Pain - part of body: Shoulder;Hip    Time: 7867-6720 PT Time Calculation (min) (ACUTE ONLY): 36 min   Charges:   PT Evaluation $PT Eval Low Complexity: 1 Low PT Treatments $Therapeutic Exercise: 8-22 mins        Greggory Stallion, PT, DPT [phone]   Lashawndra Lampkins 03/07/2019, 3:22 PM

## 2019-03-07 NOTE — TOC Transition Note (Signed)
Transition of Care [facility]) - CM/SW Discharge Note   Patient Details  Name: Destiny [name] MRN: [id number] Date of Birth: Feb 04, 1956  [facility] CM/SW Contact:  Su Hilt, RN Phone Number: 03/07/2019, 3:46 PM   Clinical Narrative:    The patient is to DC home with [facility] PT thru [facility], I provided the patient with the phone number to [facility] and spoke to the husband about who was set up.   The bedside nurse is to call the husband for DC The patient has all DME needed at home  No other needs Final next level of care: [place] Barriers to Discharge: Barriers Resolved   Patient Goals and CMS Choice Patient states their goals for this hospitalization and ongoing recovery are:: go home CMS Medicare.gov Compare Post Acute Care list provided to:: Patient Choice offered to / list presented to : Patient  Discharge Placement                       Discharge Plan and Services   Discharge Planning Services: CM Consult Post Acute Care Choice: Home Health          DME Arranged: N/A         HH Arranged: PT HH Agency: [facility] ([facility]) Date HH Agency Contacted: 03/07/19 Time [place]: 1410 Representative spoke with at [place]: [place] ([place]) Interventions     Readmission Risk Interventions Readmission Risk Prevention Plan 01/20/2019 12/13/2018  Transportation Screening - Complete  Medication Review Press photographer) Complete Complete  PCP or Specialist appointment within 3-5 days of discharge - Complete  HRI or [place] - Complete  SW Recovery Care/Counseling Consult - Complete  [place] - Complete  Some recent data might be hidden

## 2019-03-07 NOTE — TOC Initial Note (Signed)
Transition of Care [facility]) - Initial/Assessment Note    Patient Details  Name: Destiny [name] MRN: [id number] Date of Birth: 1955/12/20  Transition of Care [facility]) CM/SW Contact:    Su Hilt, RN Phone Number: 03/07/2019, 2:11 PM  Clinical Narrative:                  Met with the patient to discuss DC plan and needs She lives at home with her husband She has DME at home including WC, BSC, Shower seat, grab bars, RW, cane She has no additional needs for DME [facility] has used [facility] at the last admission in July She would like to use them again,  I notified [place] and asked him to accept the patient He will call me and let me know if they can accept the patient The patient sees doctors at [facility] and is u to date with vistis She gets her medications at the [facility] and can afford medications   Expected Discharge Plan: [place] Barriers to Discharge: Continued Medical Work up   Patient Goals and CMS Choice Patient states their goals for this hospitalization and ongoing recovery are:: go home CMS Medicare.gov Compare Post Acute Care list provided to:: Patient Choice offered to / list presented to : Patient  Expected Discharge Plan and Services Expected Discharge Plan: [place]   Discharge Planning Services: CM Consult Post Acute Care Choice: [place] arrangements for the past 2 months: Single Family Home                 DME Arranged: N/A         HH Arranged: PT HH Agency: [facility] ([place]) Date HH Agency Contacted: 03/07/19 Time [place]: 1410 Representative spoke with at [place]: [place] Arrangements/Services Living arrangements for the past 2 months: [place] with:: Spouse Patient language and need for interpreter reviewed:: No Do you feel safe going back to the place where you live?: Yes      Need for Family  Participation in Patient Care: No (Comment) Care giver support system in place?: Yes (comment) Current home services: DME(RW, WC, BSC, Shower chair, cane, grab bars) Criminal Activity/Legal Involvement Pertinent to Current Situation/Hospitalization: No - Comment as needed  Activities of Daily Living Home Assistive Devices/Equipment: Environmental consultant (specify type) ADL Screening (condition at time of admission) Patient's cognitive ability adequate to safely complete daily activities?: Yes Is the patient deaf or have difficulty hearing?: Yes Does the patient have difficulty seeing, even when wearing glasses/contacts?: No Does the patient have difficulty concentrating, remembering, or making decisions?: No Patient able to express need for assistance with ADLs?: Yes Does the patient have difficulty dressing or bathing?: Yes Independently performs ADLs?: No Does the patient have difficulty walking or climbing stairs?: Yes Weakness of Legs: Left Weakness of Arms/Hands: Left  Permission Sought/Granted   Permission granted to share information with : Yes, Verbal Permission Granted              Emotional Assessment Appearance:: Appears stated age Attitude/Demeanor/Rapport: Gracious Affect (typically observed): Appropriate Orientation: : Oriented to Self, Oriented to Place, Oriented to  Time, Oriented to Situation Alcohol / Substance Use: Not Applicable    Admission diagnosis:  Lower urinary tract infectious disease [N39.0] Fall, initial encounter [W19.XXXA] Patient Active Problem List   Diagnosis Date Noted  . UTI (urinary tract infection) 03/05/2019  . Port-A-Cath in place  02/16/2019  . Goals of care, counseling/discussion 02/07/2019  . Pneumonia 01/19/2019  . HAP (hospital-acquired pneumonia) 12/07/2018  . Acute blood loss anemia   . Chronic systolic congestive heart failure ([place])   . Embolic stroke ([place]) 03/70/9643  . 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 Cerebrovascular  accident (CVA) ([place])   . HLD (hyperlipidemia) 11/02/2018  . GERD (gastroesophageal reflux disease) 11/02/2018  . CAD (coronary artery disease) 11/02/2018  . Sleep apnea 11/02/2018  . Hypotension 11/02/2018  . Somnolence 11/02/2018  . Physical deconditioning 10/24/2018  . 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 Thrombocytopenia ([place]) 10/06/2018  . Malnutrition ([place]) 10/06/2018  . S/P exploratory laparotomy 10/06/2018  . Moderate episode of recurrent major depressive disorder ([place]) 09/28/2018  . Failure to thrive (0-17) 09/27/2018  . Abscess after procedure 09/09/2018  . Bowel perforation ([place]) 08/25/2018  . Adnexal mass 08/20/2018  . Fallopian tube cancer, carcinoma ([place]) 08/20/2018  . Chronic diastolic heart failure ([place]) 11/15/2017  . HTN (hypertension) 11/15/2017  . Hypokalemia 11/05/2017  . Chest pain 02/14/2016  . Small bowel obstruction ([place]) 06/16/2015  . History of CVA (cerebrovascular accident) 09/12/2007   PCP:  [name], MD Pharmacy:   [place], [place] [address] [place] [address] Phone: [phone] Fax: [place], [place] [address] Phone: [phone] Fax: [phone]     Social Determinants of Health (SDOH) Interventions    Readmission Risk Interventions Readmission Risk Prevention Plan 01/20/2019 12/13/2018  Transportation Screening - Complete  Medication Review (RN Care Manager) Complete Complete  PCP or Specialist appointment within 3-5 days of discharge - Complete  HRI or [place] - Complete  SW Recovery Care/Counseling Consult - Complete  [place] - Complete  Some recent data might be hidden

## 2019-03-07 NOTE — TOC Progression Note (Addendum)
Transition of Care [facility]) - Progression Note    Patient Details  Name: Destiny [name] MRN: [id number] Date of Birth: [birth year]  Transition of Care [facility]) CM/SW [place], RN Phone Number: 03/07/2019, 3:31 PM  Clinical Narrative:     Damaris Schooner with [place] at [facility] he stated that they are unable to take the patient due to not having PT available to see the patient, called Tommi Rumps with [facility] to see if they would be able to accept the patient, He did not answer the phone, I called [place] with [facility] and they accepted the patient  Expected Discharge Plan: [place] Barriers to Discharge: Continued Medical Work up  Expected Discharge Plan and Services Expected Discharge Plan: [place]   Discharge Planning Services: CM Consult Post Acute Care Choice: [place] arrangements for the past 2 months: Single Family Home Expected Discharge Date: 03/07/19               DME Arranged: N/A         HH Arranged: PT HH Agency: [facility] ([place]) Date HH Agency Contacted: 03/07/19 Time [place]: 1410 Representative spoke with at [place]: [place] ([employer]) Interventions    Readmission Risk Interventions Readmission Risk Prevention Plan 01/20/2019 12/13/2018  Transportation Screening - Complete  Medication Review Press photographer) Complete Complete  PCP or Specialist appointment within 3-5 days of discharge - Complete  HRI or [place] - Complete  SW Recovery Care/Counseling Consult - Complete  [place] - Complete  Some recent data might be hidden

## 2019-03-07 NOTE — Discharge Summary (Signed)
[place] at [place] NAME: Destiny [name]    MR#:  [id number]  DATE OF BIRTH:  09/28/1955  DATE OF ADMISSION:  03/05/2019 ADMITTING PHYSICIAN: Dustin Flock, MD  DATE OF DISCHARGE: 03/07/2019   PRIMARY CARE PHYSICIAN: [name], MD    ADMISSION DIAGNOSIS:  Lower urinary tract infectious disease [N39.0] Fall, initial encounter [W19.XXXA]  DISCHARGE DIAGNOSIS:  Active Problems:   UTI (urinary tract infection)   Generalized weakness.  SECONDARY DIAGNOSIS:   Past Medical History:  Diagnosis Date  . CAD (coronary artery disease)   . CHF (congestive heart failure) ([place])   . CVA (cerebral infarction)   . Female bladder prolapse   . GERD (gastroesophageal reflux disease)   . Hyperlipidemia   . Hypertension   . Hypokalemia   . Mitral valve disease   . Ovarian ca ([place])   . Sleep apnea   . Stroke [facility])    x3    HOSPITAL COURSE:   63 year old female with history of ovarian cancer and previous history of multiple UTIs who presented to the emergency room due to falls and generalized weakness.  1.  Generalized weakness in the setting of falls due to urinary tract infection with history of VRE/Klebsiella in the past Patient on Levaquin - will give 2 more days to finish course.  2.  Generalized weakness with recurrent falls: Physical therapy consultation requested for discharge planning  Saw the pt, suggest [facility] PT arrangement.  3.  History of stroke with some residual left-sided weakness: Continue ASA/Plavix/statin  4. Hypokalemia: Replete  5. stage IV high-grade serous serous carcinoma  Chemo was planned for Friday , contacted cancer center- they rescheduled for next week.  I spoke to pt's husband, informed.  DISCHARGE CONDITIONS:   Stable,.  CONSULTS OBTAINED:    DRUG ALLERGIES:   Allergies  Allergen Reactions  . Atorvastatin Hives    No s/sx of anaphylaxis, just intermittent hives.  Does not seem to  be a class effect as she has tolerated lovastatin previously No s/sx of anaphylaxis, just intermittent hives.  Does not seem to be a class effect as she has tolerated lovastatin previously  . Fluconazole Rash    Reaction to generic but not name brand Reaction to generic but not name brand CAN USE DIFLUCAN.DOES NOT BREAK OUT WITH DIFLUCAN.(allergic only to generic brand)  . Sertraline Itching  . Sulfa Antibiotics Rash and Other (See Comments)    "hot " sensation  . Sulfamethoxazole-Trimethoprim Rash and Other (See Comments)    "felt hot" Other reaction(s): Other (See Comments) "felt hot"  . Sulfasalazine Other (See Comments) and Rash    "hot " sensation    DISCHARGE MEDICATIONS:   Allergies as of 03/07/2019      Reactions   Atorvastatin Hives   No s/sx of anaphylaxis, just intermittent hives.  Does not seem to be a class effect as she has tolerated lovastatin previously No s/sx of anaphylaxis, just intermittent hives.  Does not seem to be a class effect as she has tolerated lovastatin previously   Fluconazole Rash   Reaction to generic but not name brand Reaction to generic but not name brand CAN USE DIFLUCAN.DOES NOT BREAK OUT WITH DIFLUCAN.(allergic only to generic brand)   Sertraline Itching   Sulfa Antibiotics Rash, Other (See Comments)   "hot " sensation   Sulfamethoxazole-trimethoprim Rash, Other (See Comments)   "felt hot" Other reaction(s): Other (See Comments) "felt hot"   Sulfasalazine Other (See Comments), Rash   "  hot " sensation      Medication List    STOP taking these medications   amoxicillin-clavulanate 875-125 MG tablet Commonly known as: AUGMENTIN   doxycycline 100 MG tablet Commonly known as: VIBRA-TABS   fluconazole 150 MG tablet Commonly known as: Diflucan     TAKE these medications   acetaminophen 325 MG tablet Commonly known as: TYLENOL Take 1-2 tablets (325-650 mg total) by mouth every 4 (four) hours as needed for mild pain.   aspirin  81 MG chewable tablet Chew 81 mg by mouth daily.   clonazePAM 0.5 MG tablet Commonly known as: KLONOPIN Take 1 tablet (0.5 mg total) by mouth at bedtime as needed for anxiety.   clopidogrel 75 MG tablet Commonly known as: PLAVIX Take 1 tablet (75 mg total) by mouth daily.   famotidine 20 MG tablet Commonly known as: PEPCID Take 1 tablet (20 mg total) by mouth 2 (two) times daily.   levofloxacin 500 MG tablet Commonly known as: Levaquin Take 1 tablet (500 mg total) by mouth daily for 2 days.   lidocaine-prilocaine cream Commonly known as: EMLA Apply to affected area once   Melatonin 3 MG Tabs Take 3 mg by mouth at bedtime.   mometasone-formoterol 100-5 MCG/ACT Aero Commonly known as: DULERA Inhale 2 puffs into the lungs 2 times daily at 12 noon and 4 pm.   mupirocin ointment 2 % Commonly known as: Bactroban Apply topically daily.   nitroGLYCERIN 0.4 MG SL tablet Commonly known as: NITROSTAT Place 0.4 mg under the tongue every 5 (five) minutes as needed for chest pain.   ondansetron 4 MG tablet Commonly known as: ZOFRAN Take 4 mg by mouth every 8 (eight) hours as needed for nausea or vomiting.   oxybutynin 5 MG tablet Commonly known as: DITROPAN Take 1 tablet (5 mg total) by mouth 2 (two) times daily.   oxyCODONE 5 MG immediate release tablet Commonly known as: Oxy IR/ROXICODONE Take 1-2 tablets (5-10 mg total) by mouth every 6 (six) hours as needed for severe pain.   PARoxetine 30 MG tablet Commonly known as: PAXIL Take 1 tablet (30 mg total) by mouth daily.   pregabalin 150 MG capsule Commonly known as: LYRICA Take 1 capsule (150 mg total) by mouth at bedtime.   pregabalin 75 MG capsule Commonly known as: LYRICA Take 1 capsule (75 mg total) by mouth 2 (two) times daily.   prochlorperazine 10 MG tablet Commonly known as: COMPAZINE Take 1 tablet (10 mg total) by mouth every 6 (six) hours as needed (Nausea or vomiting).   rosuvastatin 20 MG  tablet Commonly known as: CRESTOR Take 1 tablet (20 mg total) by mouth at bedtime.   spironolactone 25 MG tablet Commonly known as: ALDACTONE Take 0.5 tablets (12.5 mg total) by mouth daily.   STOOL SOFTENER PO Take 100 mg by mouth daily.   venlafaxine XR 75 MG 24 hr capsule Commonly known as: EFFEXOR-XR Take 75 mg by mouth daily with breakfast.   Vitamin D3 125 MCG (5000 UT) Caps Take 1 capsule (5,000 Units total) by mouth daily.        DISCHARGE INSTRUCTIONS:   Follow with PMD in 1 week.  If you experience worsening of your admission symptoms, develop shortness of breath, life threatening emergency, suicidal or homicidal thoughts you must seek medical attention immediately by calling 911 or calling your MD immediately  if symptoms less severe.  You Must read complete instructions/literature along with all the possible adverse reactions/side effects for all the  Medicines you take and that have been prescribed to you. Take any new Medicines after you have completely understood and accept all the possible adverse reactions/side effects.   Please note  You were cared for by a hospitalist during your hospital stay. If you have any questions about your discharge medications or the care you received while you were in the hospital after you are discharged, you can call the unit and asked to speak with the hospitalist on call if the hospitalist that took care of you is not available. Once you are discharged, your primary care physician will handle any further medical issues. Please note that NO REFILLS for any discharge medications will be authorized once you are discharged, as it is imperative that you return to your primary care physician (or establish a relationship with a primary care physician if you do not have one) for your aftercare needs so that they can reassess your need for medications and monitor your lab values.    Today   CHIEF COMPLAINT:   Chief Complaint  Patient  presents with  . Fall  . Dizziness    HISTORY OF PRESENT ILLNESS:  Destiny [name]  is a 63 y.o. female with a known history of coronary artery disease, congestive heart failure, CVA, GERD,  Hypertension, hyperlipemia and sleep apnea who is presenting with complaint of a fall and dizziness.  Patient has a history of ovarian cancer and is receiving chemo as outpatient.  According to husband she also had a stroke in April.  And he has chronic weakness with the left upper extremity and left lower extremity.  She has had 3 falls within the past few weeks.  She fell earlier today as well.  She is brought to the ER and noted to have urinary tract infection.   VITAL SIGNS:  Blood pressure 107/70, pulse (!) 114, temperature 98.4 F (36.9 C), temperature source Oral, resp. rate 16, height 5\' 2"  (1.575 m), weight 87.5 kg, SpO2 97 %.  I/O:    Intake/Output Summary (Last 24 hours) at 03/07/2019 1501 Last data filed at 03/07/2019 1358 Gross per 24 hour  Intake 552.11 ml  Output 2450 ml  Net -1897.89 ml    PHYSICAL EXAMINATION:  GENERAL:  63 y.o.-year-old patient lying in the bed with no acute distress.  EYES: Pupils equal, round, reactive to light and accommodation. No scleral icterus. Extraocular muscles intact.  HEENT: Head atraumatic, normocephalic. Oropharynx and nasopharynx clear.  NECK:  Supple, no jugular venous distention. No thyroid enlargement, no tenderness.  LUNGS: Normal breath sounds bilaterally, no wheezing, rales,rhonchi or crepitation. No use of accessory muscles of respiration.  CARDIOVASCULAR: S1, S2 normal. No murmurs, rubs, or gallops.  ABDOMEN: Soft, non-tender, non-distended. Bowel sounds present. No organomegaly or mass.  EXTREMITIES: No pedal edema, cyanosis, or clubbing.  NEUROLOGIC: Cranial nerves II through XII are intact. Muscle strength 4/5 in all extremities. Sensation intact. Gait not checked.  PSYCHIATRIC: The patient is alert and oriented x 3.  SKIN: No obvious  rash, lesion, or ulcer.   DATA REVIEW:   CBC Recent Labs  Lab 03/06/19 0415  WBC 5.8  HGB 9.9*  HCT 30.7*  PLT 184    Chemistries  Recent Labs  Lab 03/07/19 0359  NA 138  K 3.5  CL 105  CO2 24  GLUCOSE 119*  BUN 9  CREATININE 0.90  CALCIUM 9.2    Cardiac Enzymes No results for input(s): TROPONINI in the last 168 hours.  Microbiology Results  Results for  orders placed or performed during the hospital encounter of 03/05/19  Urine culture     Status: Abnormal   Collection Time: 03/05/19  3:08 PM   Specimen: Urine, Catheterized  Result Value Ref Range Status   Specimen Description   Final    URINE, CATHETERIZED Performed at [facility], [address] [place], [place] [address]    Special Requests   Final    NONE Performed at [facility], [address] [place], [place] [address]    Culture >=100,000 COLONIES/mL LACTOBACILLUS SPECIES (A)  Final   Report Status 03/07/2019 FINAL  Final  SARS Coronavirus 2 [facility] order, Performed in [facility] hospital lab) Nasopharyngeal Nasopharyngeal Swab     Status: None   Collection Time: 03/05/19  4:35 PM   Specimen: Nasopharyngeal Swab  Result Value Ref Range Status   SARS Coronavirus 2 NEGATIVE NEGATIVE Final    Comment: (NOTE) If result is NEGATIVE SARS-CoV-2 target nucleic acids are NOT DETECTED. The SARS-CoV-2 RNA is generally detectable in upper and lower  respiratory specimens during the acute phase of infection. The lowest  concentration of SARS-CoV-2 viral copies this assay can detect is 250  copies / mL. A negative result does not preclude SARS-CoV-2 infection  and should not be used as the sole basis for treatment or other  patient management decisions.  A negative result may occur with  improper specimen collection / handling, submission of specimen other  than nasopharyngeal swab, presence of viral mutation(s) within the  areas targeted by this assay, and inadequate number of viral  copies  (<250 copies / mL). A negative result must be combined with clinical  observations, patient history, and epidemiological information. If result is POSITIVE SARS-CoV-2 target nucleic acids are DETECTED. The SARS-CoV-2 RNA is generally detectable in upper and lower  respiratory specimens dur ing the acute phase of infection.  Positive  results are indicative of active infection with SARS-CoV-2.  Clinical  correlation with patient history and other diagnostic information is  necessary to determine patient infection status.  Positive results do  not rule out bacterial infection or co-infection with other viruses. If result is PRESUMPTIVE POSTIVE SARS-CoV-2 nucleic acids MAY BE PRESENT.   A presumptive positive result was obtained on the submitted specimen  and confirmed on repeat testing.  While 2019 novel coronavirus  (SARS-CoV-2) nucleic acids may be present in the submitted sample  additional confirmatory testing may be necessary for epidemiological  and / or clinical management purposes  to differentiate between  SARS-CoV-2 and other Sarbecovirus currently known to infect humans.  If clinically indicated additional testing with an alternate test  methodology [phone]) is advised. The SARS-CoV-2 RNA is generally  detectable in upper and lower respiratory sp ecimens during the acute  phase of infection. The expected result is Negative. Fact Sheet for Patients:  StrictlyIdeas.no Fact Sheet for Healthcare Providers: BankingDealers.co.za This test is not yet approved or cleared by the [place] FDA and has been authorized for detection and/or diagnosis of SARS-CoV-2 by FDA under an Emergency Use Authorization (EUA).  This EUA will remain in effect (meaning this test can be used) for the duration of the COVID-19 declaration under Section 564(b)(1) of the Act, 21 U.S.C. section 360bbb-3(b)(1), unless the authorization is terminated  or revoked sooner. Performed at [facility], [address] [place], [place] [address]     RADIOLOGY:  No results found.  EKG:   Orders placed or performed during the hospital encounter of 03/05/19  .  ED EKG  . ED EKG      Management plans discussed with the patient, family and they are in agreement.  CODE STATUS: Full.    Code Status Orders  (From admission, onward)         Start     Ordered   03/05/19 2011  Full code  Continuous     03/05/19 2011        Code Status History    Date Active Date Inactive Code Status Order ID Comments User Context   01/19/2019 2231 01/21/2019 1519 Full Code [id number]  Henreitta Leber, MD Inpatient   12/07/2018 1535 12/13/2018 2205 Full Code [id number]  Demetrios Loll, MD Inpatient   11/09/2018 1522 11/29/2018 1232 Full Code [id number]  Flora Lipps Inpatient   11/02/2018 0448 11/09/2018 1459 Full Code [id number]  Lance Coon, MD Inpatient   11/13/2017 0804 11/14/2017 1819 Full Code [id number]  Loletha Grayer, MD ED   11/05/2017 1638 11/06/2017 2025 Full Code [id number]  Salary, Avel Peace, MD Inpatient   02/05/2017 0035 02/05/2017 2254 Full Code [id number]  Valinda Party, DO Inpatient   02/15/2016 0806 02/15/2016 2136 Full Code [id number]  Hillary Bow, MD Inpatient   02/14/2016 1728 02/15/2016 0747 Full Code [id number]  Dustin Flock, MD ED   06/16/2015 1753 06/19/2015 1822 Full Code [id number]  Clayburn Pert, MD Inpatient   Advance Care Planning Activity      TOTAL TIME TAKING CARE OF THIS PATIENT: 35 minutes.    Vaughan Basta M.D on 03/07/2019 at 3:01 PM  Between 7am to 6pm - Pager - [phone]  After 6pm go to [email or web address] - password [email or web address] [place] [facility]  Office  [phone]  CC: Primary care physician; [name], MD   Note: This dictation was prepared with Dragon dictation along with smaller phrase technology. Any transcriptional errors that result from this process are  unintentional.

## 2019-03-08 ENCOUNTER — Other Ambulatory Visit: Payer: Self-pay | Admitting: *Deleted

## 2019-03-08 ENCOUNTER — Inpatient Hospital Stay: Payer: Medicaid Other

## 2019-03-08 ENCOUNTER — Inpatient Hospital Stay: Payer: Medicaid Other | Admitting: Hospice and Palliative Medicine

## 2019-03-08 ENCOUNTER — Inpatient Hospital Stay: Payer: Medicaid Other | Admitting: Oncology

## 2019-03-08 MED ORDER — OXYCODONE HCL 5 MG PO TABS: 5.0000 mg | ORAL_TABLET | Freq: Four times a day (QID) | ORAL | 0 refills | Status: AC | PRN

## 2019-03-08 NOTE — Progress Notes (Signed)
Second request sent. [facility] CDx request sent on specimen [phone] collected 08/21/18 at [facility]. Confirmation of receipt received

## 2019-03-11 ENCOUNTER — Other Ambulatory Visit: Payer: Self-pay

## 2019-03-11 ENCOUNTER — Emergency Department
Admission: EM | Admit: 2019-03-11 | Discharge: 2019-03-12 | Disposition: A | Discharge status: Home / Self Care | Payer: Medicaid Other | Attending: Emergency Medicine | Admitting: Emergency Medicine

## 2019-03-11 ENCOUNTER — Encounter: Payer: Self-pay | Admitting: Emergency Medicine

## 2019-03-11 DIAGNOSIS — N3090 Cystitis, unspecified without hematuria: Secondary | ICD-10-CM | POA: Insufficient documentation

## 2019-03-11 DIAGNOSIS — I110 Hypertensive heart disease with heart failure: Secondary | ICD-10-CM | POA: Diagnosis not present

## 2019-03-11 DIAGNOSIS — Z79899 Other long term (current) drug therapy: Secondary | ICD-10-CM | POA: Insufficient documentation

## 2019-03-11 DIAGNOSIS — Z20828 Contact with and (suspected) exposure to other viral communicable diseases: Secondary | ICD-10-CM | POA: Diagnosis not present

## 2019-03-11 DIAGNOSIS — Z7982 Long term (current) use of aspirin: Secondary | ICD-10-CM | POA: Insufficient documentation

## 2019-03-11 DIAGNOSIS — Z8673 Personal history of transient ischemic attack (TIA), and cerebral infarction without residual deficits: Secondary | ICD-10-CM | POA: Diagnosis not present

## 2019-03-11 DIAGNOSIS — I2510 Atherosclerotic heart disease of native coronary artery without angina pectoris: Secondary | ICD-10-CM | POA: Diagnosis not present

## 2019-03-11 DIAGNOSIS — R5383 Other fatigue: Secondary | ICD-10-CM

## 2019-03-11 DIAGNOSIS — I5022 Chronic systolic (congestive) heart failure: Secondary | ICD-10-CM | POA: Diagnosis not present

## 2019-03-11 LAB — CBC WITH DIFFERENTIAL/PLATELET
Abs Immature Granulocytes: 0.07 10*3/uL (ref 0.00–0.07)
Basophils Absolute: 0 10*3/uL (ref 0.0–0.1)
Basophils Relative: 0 %
Eosinophils Absolute: 0.2 10*3/uL (ref 0.0–0.5)
Eosinophils Relative: 3 %
HCT: 31.9 % — ABNORMAL LOW (ref 36.0–46.0)
Hemoglobin: 10.5 g/dL — ABNORMAL LOW (ref 12.0–15.0)
Immature Granulocytes: 1 %
Lymphocytes Relative: 22 %
Lymphs Abs: 1.5 10*3/uL (ref 0.7–4.0)
MCH: 31.1 pg (ref 26.0–34.0)
MCHC: 32.9 g/dL (ref 30.0–36.0)
MCV: 94.4 fL (ref 80.0–100.0)
Monocytes Absolute: 0.8 10*3/uL (ref 0.1–1.0)
Monocytes Relative: 11 %
Neutro Abs: 4.4 10*3/uL (ref 1.7–7.7)
Neutrophils Relative %: 63 %
Platelets: 234 10*3/uL (ref 150–400)
RBC: 3.38 MIL/uL — ABNORMAL LOW (ref 3.87–5.11)
RDW: 14 % (ref 11.5–15.5)
WBC: 7 10*3/uL (ref 4.0–10.5)
nRBC: 0 % (ref 0.0–0.2)

## 2019-03-11 LAB — COMPREHENSIVE METABOLIC PANEL
ALT: 20 U/L (ref 0–44)
AST: 32 U/L (ref 15–41)
Albumin: 3.8 g/dL (ref 3.5–5.0)
Alkaline Phosphatase: 73 U/L (ref 38–126)
Anion gap: 10 (ref 5–15)
BUN: 14 mg/dL (ref 8–23)
CO2: 24 mmol/L (ref 22–32)
Calcium: 9.5 mg/dL (ref 8.9–10.3)
Chloride: 103 mmol/L (ref 98–111)
Creatinine, Ser: 0.74 mg/dL (ref 0.44–1.00)
GFR calc Af Amer: >60 mL/min (ref >60)
GFR calc non Af Amer: >60 mL/min (ref >60)
Glucose, Bld: 110 mg/dL — ABNORMAL HIGH (ref 70–99)
Potassium: 3.7 mmol/L (ref 3.5–5.1)
Sodium: 137 mmol/L (ref 135–145)
Total Bilirubin: 0.4 mg/dL (ref 0.3–1.2)
Total Protein: 7.4 g/dL (ref 6.5–8.1)

## 2019-03-11 LAB — TROPONIN I (HIGH SENSITIVITY): Troponin I (High Sensitivity): 3 ng/L (ref <18)

## 2019-03-11 NOTE — ED Notes (Signed)
First RN Note: Pt presents to ED via POV with c/o increased sleepiness that started today. Pt states slept last night, then slept all day today.

## 2019-03-11 NOTE — ED Triage Notes (Signed)
Pt to triage via w/c with no distress noted, mask in place; pt reports sleeping a lot today; st "just feels tired"

## 2019-03-12 ENCOUNTER — Emergency Department: Payer: Medicaid Other

## 2019-03-12 LAB — URINALYSIS, COMPLETE (UACMP) WITH MICROSCOPIC
Bilirubin Urine: NEGATIVE
Glucose, UA: NEGATIVE mg/dL
Hgb urine dipstick: NEGATIVE
Ketones, ur: NEGATIVE mg/dL
Nitrite: NEGATIVE
Protein, ur: NEGATIVE mg/dL
Specific Gravity, Urine: 1.023 (ref 1.005–1.030)
Squamous Epithelial / HPF: NONE SEEN (ref 0–5)
WBC, UA: >50 WBC/hpf — ABNORMAL HIGH (ref 0–5)
pH: 5 (ref 5.0–8.0)

## 2019-03-12 LAB — SARS CORONAVIRUS 2 (TAT 6-24 HRS): SARS Coronavirus 2: NEGATIVE

## 2019-03-12 MED ORDER — NITROFURANTOIN MACROCRYSTAL 100 MG PO CAPS: 100.0000 mg | ORAL_CAPSULE | Freq: Two times a day (BID) | ORAL | 0 refills | Status: AC

## 2019-03-12 MED ORDER — ONDANSETRON 4 MG PO TBDP: 4.0000 mg | ORAL_TABLET | Freq: Three times a day (TID) | ORAL | 0 refills | Status: AC | PRN

## 2019-03-12 MED ORDER — NITROFURANTOIN MONOHYD MACRO 100 MG PO CAPS
100.0000 mg | ORAL_CAPSULE | Freq: Once | ORAL | Status: AC
  Administered 2019-03-12: 04:00:00 100 mg via ORAL
  Filled 2019-03-12: qty 1

## 2019-03-12 MED ORDER — AMOXICILLIN-POT CLAVULANATE 875-125 MG PO TABS: 1.0000 | ORAL_TABLET | Freq: Two times a day (BID) | ORAL | 0 refills | Status: AC

## 2019-03-12 MED ORDER — AMOXICILLIN-POT CLAVULANATE 875-125 MG PO TABS
1.0000 | ORAL_TABLET | Freq: Once | ORAL | Status: AC
  Administered 2019-03-12: 1 via ORAL
  Filled 2019-03-12: qty 1

## 2019-03-12 NOTE — ED Notes (Addendum)
Husband at bedside and he is her main caretaker. He states she sleeps all the time and is very fatigued. Pt has a dx of ovarian cancer Pt also has a hx of strokes and tia

## 2019-03-12 NOTE — Discharge Instructions (Signed)
Your urine test shows a urinary tract infection again.  Based on prior culture results, you should take Augmentin and nitrofurantoin to treat this infection.  Please follow-up with your primary care doctor this week for continued monitoring of your symptoms.  Return to the emergency department if your condition is worsening.  Your other labs, chest x-ray, CT scan of the head, and MRI of the brain were all unremarkable and reassuring.

## 2019-03-12 NOTE — ED Notes (Signed)
Call to husband, discharge instructions explained. He will be here in approx 20 min to pick pt up

## 2019-03-12 NOTE — ED Provider Notes (Signed)
[facility] Emergency Department Provider Note  ____________________________________________  Time seen: Approximately 4:39 AM  I have reviewed the triage vital signs and the nursing notes.   HISTORY  Chief Complaint Fatigue   HPI Destiny [name] is a 63 y.o. female with a history of CAD CHF CVA GERD hypertension and ovarian cancer and prior strokes who comes the ED complaining of fatigue that started in the last 24 hours.  She is sleeping more, lower energy.  Last time she felt like this a week ago, she had a UTI.  With this they are noticing that she seems more off balance which is a chronic issue for her.  Denies any significant fall or head trauma.   No fever or chills, eating and drinking normally.  No syncope.  No vomiting.  No diarrhea.  No body aches      Past Medical History:  Diagnosis Date  . CAD (coronary artery disease)   . CHF (congestive heart failure) ([place])   . CVA (cerebral infarction)   . Female bladder prolapse   . GERD (gastroesophageal reflux disease)   . Hyperlipidemia   . Hypertension   . Hypokalemia   . Mitral valve disease   . Ovarian ca ([place])   . Sleep apnea   . Stroke [facility])    x3     Patient Active Problem List   Diagnosis Date Noted  . UTI (urinary tract infection) 03/05/2019  . Port-A-Cath in place 02/16/2019  . Goals of care, counseling/discussion 02/07/2019  . Pneumonia 01/19/2019  . HAP (hospital-acquired pneumonia) 12/07/2018  . Acute blood loss anemia   . Chronic systolic congestive heart failure ([place])   . Embolic stroke ([place]) [id number]  . 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 Cerebrovascular accident (CVA) ([place])   . HLD (hyperlipidemia) 11/02/2018  . GERD (gastroesophageal reflux disease) 11/02/2018  . CAD (coronary artery disease) 11/02/2018  . Sleep apnea 11/02/2018  . Hypotension 11/02/2018  . Somnolence 11/02/2018  . Physical deconditioning 10/24/2018  . 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  Thrombocytopenia ([place]) 10/06/2018  . Malnutrition ([place]) 10/06/2018  . S/P exploratory laparotomy 10/06/2018  . Moderate episode of recurrent major depressive disorder ([place]) 09/28/2018  . Failure to thrive (0-17) 09/27/2018  . Abscess after procedure 09/09/2018  . Bowel perforation ([place]) 08/25/2018  . Adnexal mass 08/20/2018  . 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 Chronic diastolic heart failure ([place]) 11/15/2017  . HTN (hypertension) 11/15/2017  . Hypokalemia 11/05/2017  . Chest pain 02/14/2016  . Small bowel obstruction ([place]) 06/16/2015  . History of CVA (cerebrovascular accident) 09/12/2007     Past Surgical History:  Procedure Laterality Date  . CARPAL TUNNEL RELEASE Bilateral   . CATARACT EXTRACTION Right 11/22/2017  . CHOLECYSTECTOMY    . COLON SURGERY    . TENNIS ELBOW RELEASE/NIRSCHEL PROCEDURE Left   . TONSILLECTOMY       Prior to Admission medications   Medication Sig Start Date End Date Taking? Authorizing Provider  acetaminophen (TYLENOL) 325 MG tablet Take 1-2 tablets (325-650 mg total) by mouth every 4 (four) hours as needed for mild pain. 11/28/18   Angiulli, Lavon Paganini, PA-C  amoxicillin-clavulanate (AUGMENTIN) 875-125 MG tablet Take 1 tablet by mouth 2 (two) times daily. 03/12/19   Carrie Mew, MD  aspirin 81 MG chewable tablet Chew 81 mg by mouth daily.    [provider]  Cholecalciferol (VITAMIN D3) 125 MCG (5000 UT) CAPS Take 1 capsule (5,000 Units total) by  mouth daily. 11/28/18   Angiulli, Lavon Paganini, PA-C  clonazePAM (KLONOPIN) 0.5 MG tablet Take 1 tablet (0.5 mg total) by mouth at bedtime as needed for anxiety. 11/28/18   Angiulli, Lavon Paganini, PA-C  clopidogrel (PLAVIX) 75 MG tablet Take 1 tablet (75 mg total) by mouth daily. 11/28/18   Angiulli, Lavon Paganini, PA-C  Docusate Calcium (STOOL SOFTENER PO) Take 100 mg by mouth daily.     [provider]  famotidine (PEPCID) 20 MG tablet Take 1 tablet (20 mg total) by mouth 2 (two)  times daily. 11/28/18   Angiulli, Lavon Paganini, PA-C  lidocaine-prilocaine (EMLA) cream Apply to affected area once 02/06/19   Earlie Server, MD  Melatonin 3 MG TABS Take 3 mg by mouth at bedtime.    [provider]  mometasone-formoterol (DULERA) 100-5 MCG/ACT AERO Inhale 2 puffs into the lungs 2 times daily at 12 noon and 4 pm. 01/24/18   [provider]  mupirocin ointment (BACTROBAN) 2 % Apply topically daily. 02/15/19   Earlie Server, MD  nitrofurantoin (MACRODANTIN) 100 MG capsule Take 1 capsule (100 mg total) by mouth 2 (two) times daily. 03/12/19   Carrie Mew, MD  nitroGLYCERIN (NITROSTAT) 0.4 MG SL tablet Place 0.4 mg under the tongue every 5 (five) minutes as needed for chest pain.    [provider]  ondansetron (ZOFRAN ODT) 4 MG disintegrating tablet Take 1 tablet (4 mg total) by mouth every 8 (eight) hours as needed for nausea or vomiting. 03/12/19   Carrie Mew, MD  ondansetron (ZOFRAN) 4 MG tablet Take 4 mg by mouth every 8 (eight) hours as needed for nausea or vomiting.    [provider]  oxybutynin (DITROPAN) 5 MG tablet Take 1 tablet (5 mg total) by mouth 2 (two) times daily. 11/28/18   Angiulli, Lavon Paganini, PA-C  oxyCODONE (OXY IR/ROXICODONE) 5 MG immediate release tablet Take 1-2 tablets (5-10 mg total) by mouth every 6 (six) hours as needed for severe pain. 03/08/19   Borders, Kirt Boys, NP  PARoxetine (PAXIL) 30 MG tablet Take 1 tablet (30 mg total) by mouth daily. 11/28/18   Angiulli, Lavon Paganini, PA-C  pregabalin (LYRICA) 150 MG capsule Take 1 capsule (150 mg total) by mouth at bedtime. 11/28/18   Angiulli, Lavon Paganini, PA-C  pregabalin (LYRICA) 75 MG capsule Take 1 capsule (75 mg total) by mouth 2 (two) times daily. 12/13/18   Mayo, Pete Pelt, MD  prochlorperazine (COMPAZINE) 10 MG tablet Take 1 tablet (10 mg total) by mouth every 6 (six) hours as needed (Nausea or vomiting). 02/06/19   Earlie Server, MD  rosuvastatin (CRESTOR) 20 MG tablet Take 1 tablet (20 mg  total) by mouth at bedtime. 11/28/18   Angiulli, Lavon Paganini, PA-C  spironolactone (ALDACTONE) 25 MG tablet Take 0.5 tablets (12.5 mg total) by mouth daily. 11/28/18 11/28/19  Angiulli, Lavon Paganini, PA-C  venlafaxine XR (EFFEXOR-XR) 75 MG 24 hr capsule Take 75 mg by mouth daily with breakfast.    [provider]     Allergies Atorvastatin, Fluconazole, Sertraline, Sulfa antibiotics, Sulfamethoxazole-trimethoprim, and Sulfasalazine   Family History  Problem Relation Age of Onset  . Heart disease Other   . Hypertension Other   . Rheum arthritis Mother   . CAD Father     Social History Social History   Tobacco Use  . Smoking status: Never Smoker  . Smokeless tobacco: Never Used  Substance Use Topics  . Alcohol use: No    Alcohol/week: 0.0 standard drinks  .  Drug use: No    Review of Systems  Constitutional:   No fever or chills.  Positive fatigue ENT:   No sore throat. No rhinorrhea. Cardiovascular:   No chest pain or syncope. Respiratory:   No dyspnea or cough. Gastrointestinal:   Negative for abdominal pain, vomiting and diarrhea.  Musculoskeletal:   Negative for focal pain or swelling All other systems reviewed and are negative except as documented above in ROS and HPI.  ____________________________________________   PHYSICAL EXAM:  VITAL SIGNS: ED Triage Vitals  Enc Vitals Group     BP 03/11/19 2005 109/90     Pulse Rate 03/11/19 2005 (!) 105     Resp 03/11/19 2005 18     Temp 03/11/19 2005 98 F (36.7 C)     Temp Source 03/11/19 2005 Oral     SpO2 03/11/19 2005 97 %     Weight 03/11/19 2007 178 lb (80.7 kg)     Height 03/11/19 2007 5\' 2"  (1.575 m)     Head Circumference --      Peak Flow --      Pain Score 03/11/19 2006 0     Pain Loc --      Pain Edu? --      Excl. in [place]? --     Vital signs reviewed, nursing assessments reviewed.   Constitutional:   Alert and oriented. Non-toxic appearance. Eyes:   Conjunctivae are normal. EOMI. PERRL. ENT       Head:   Normocephalic and atraumatic.      Nose:   No congestion/rhinnorhea.       Mouth/Throat:   MMM, no pharyngeal erythema. No peritonsillar mass.       Neck:   No meningismus. Full ROM. Hematological/Lymphatic/Immunilogical:   No cervical lymphadenopathy. Cardiovascular:   RRR. Symmetric bilateral radial and DP pulses.  No murmurs. Cap refill less than 2 seconds. Respiratory:   Normal respiratory effort without tachypnea/retractions.  Mild expiratory wheezing Gastrointestinal:   Soft and nontender. Non distended. There is no CVA tenderness.  No rebound, rigidity, or guarding.  Musculoskeletal:   Normal range of motion in all extremities. No joint effusions.  No lower extremity tenderness.  No edema. Neurologic:   Normal speech and language.  Motor grossly intact. No acute focal neurologic deficits are appreciated.  Skin:    Skin is warm, dry and intact. No rash noted.  No petechiae, purpura, or bullae.  ____________________________________________    LABS (pertinent positives/negatives) (all labs ordered are listed, but only abnormal results are displayed) Labs Reviewed  CBC WITH DIFFERENTIAL/PLATELET - Abnormal; Notable for the following components:      Result Value   RBC 3.38 (*)    Hemoglobin 10.5 (*)    HCT 31.9 (*)    All other components within normal limits  COMPREHENSIVE METABOLIC PANEL - Abnormal; Notable for the following components:   Glucose, Bld 110 (*)    All other components within normal limits  URINALYSIS, COMPLETE (UACMP) WITH MICROSCOPIC - Abnormal; Notable for the following components:   Color, Urine YELLOW (*)    APPearance HAZY (*)    Leukocytes,Ua LARGE (*)    WBC, UA >50 (*)    Bacteria, UA RARE (*)    All other components within normal limits  SARS CORONAVIRUS 2 (TAT 6-12 HRS)  URINE CULTURE  TROPONIN I (HIGH SENSITIVITY)   ____________________________________________   EKG  Interpreted by me Sinus tachycardia rate 106, normal axis  intervals QRS ST segments and T waves  ____________________________________________    RADIOLOGY  Dg Abdomen 1 View  Result Date: 03/12/2019 CLINICAL DATA:  63 year old female for MRI clearance. EXAM: ABDOMEN - 1 VIEW COMPARISON:  Abdominal radiograph dated 06/17/2015 FINDINGS: There is moderate stool throughout the colon. No bowel dilatation. Surgical anastomotic bowel suture noted in the left hemiabdomen. Multiple surgical clips noted over the pelvis. Old left lateral rib fractures. No acute osseous pathology. IMPRESSION: Bowel surgical suture in the left hemiabdomen and several surgical clips over the pelvis. No other radiopaque foreign object identified. Electronically Signed   By: Anner Crete M.D.   On: 03/12/2019 03:56   Ct Head Wo Contrast  Result Date: 03/12/2019 CLINICAL DATA:  Headache EXAM: CT HEAD WITHOUT CONTRAST TECHNIQUE: Contiguous axial images were obtained from the base of the skull through the vertex without intravenous contrast. COMPARISON:  CT brain 03/05/2019 FINDINGS: Brain: No acute territorial infarction, hemorrhage, or intracranial mass. Atrophy and small vessel ischemic changes of the white matter. Chronic left frontal and parietal lobe infarcts. Chronic infarcts within the cerebellum. Cystic area in the left basal ganglia is unchanged. Stable prominent ventricle size. Vascular: No hyperdense vessels.  Carotid vascular calcification Skull: Normal. Negative for fracture or focal lesion. Sinuses/Orbits: Mucosal thickening or retention cyst in the left sphenoid sinus. Other: None IMPRESSION: 1. No CT evidence for acute intracranial abnormality. 2. Atrophy and chronic small vessel ischemic changes of the white matter. Multifocal chronic infarcts involving the left frontal, and parietal lobes as well as the cerebellum. Electronically Signed   By: Donavan Foil M.D.   On: 03/12/2019 01:50   Mr Brain Wo Contrast  Result Date: 03/12/2019 CLINICAL DATA:  Focal neuro deficit.   Fatigue EXAM: MRI HEAD WITHOUT CONTRAST TECHNIQUE: Multiplanar, multiecho pulse sequences of the brain and surrounding structures were obtained without intravenous contrast. COMPARISON:  Head CT earlier today FINDINGS: Brain: Small remote left frontal and parietal cortically based infarcts seen since at least 2016. Small remote bilateral cerebellar infarcts that are also long-standing. Cystic space at the left basal ganglia, likely dilated perivascular space, stable. Cerebral volume loss with mild ventriculomegaly. Mild ischemic gliosis in the deep cerebral white matter. No acute infarct, hemorrhage, obstructive hydrocephalus, or masslike finding. Vascular: Major flow voids are preserved Skull and upper cervical spine: Negative for marrow lesion Sinuses/Orbits: Bilateral cataract resection. Motion degraded such that fast brain protocol was needed. IMPRESSION: 1. No emergent finding. 2. Atrophy and chronic ischemic injury seen since at least 2016. Electronically Signed   By: Monte Fantasia M.D.   On: 03/12/2019 05:20   Dg Chest Portable 1 View  Result Date: 03/12/2019 CLINICAL DATA:  Fatigue EXAM: PORTABLE CHEST 1 VIEW COMPARISON:  02/01/2019 FINDINGS: Right-sided central venous port tip over the cavoatrial junction. No consolidation or effusion. Minimal bronchitic changes. Normal heart size. No pneumothorax. IMPRESSION: No active disease. Electronically Signed   By: Donavan Foil M.D.   On: 03/12/2019 00:56    ____________________________________________   PROCEDURES Procedures  ____________________________________________  DIFFERENTIAL DIAGNOSIS   Dehydration, urinary tract infection, pneumonia, intracranial hemorrhage, electrolyte abnormality, COVID-19  CLINICAL IMPRESSION / ASSESSMENT AND PLAN / ED COURSE  Medications ordered in the ED: Medications  amoxicillin-clavulanate (AUGMENTIN) 875-125 MG per tablet 1 tablet (1 tablet Oral Given 03/12/19 0407)  nitrofurantoin  (macrocrystal-monohydrate) (MACROBID) capsule 100 mg (100 mg Oral Given 03/12/19 0407)    Pertinent labs & imaging results that were available during my care of the patient were reviewed by me and considered in my medical decision making (see chart for  details).  JAKIMA SCUDERI was evaluated in Emergency Department on 03/12/2019 for the symptoms described in the history of present illness. She was evaluated in the context of the global COVID-19 pandemic, which necessitated consideration that the patient might be at risk for infection with the SARS-CoV-2 virus that causes COVID-19. Institutional protocols and algorithms that pertain to the evaluation of patients at risk for COVID-19 are in a state of rapid change based on information released by regulatory bodies including the CDC and federal and state organizations. These policies and algorithms were followed during the patient's care in the ED.   Patient presents with fatigue.  No specific symptoms.  Doubt stroke non-STEMI dissection AAA carditis or PE.  High suspicion for urinary tract infection, patient is not septic.    Clinical Course as of Mar 11 538  Tue Mar 12, 2019  [id number] CT negative.  Chest x-ray negative.  Labs unremarkable.  Awaiting urinalysis.  Will get MRI brain given multiple prior chronic infarcts and report of worsened balance today.   [PS]    Clinical Course User Index [PS] Carrie Mew, MD    ----------------------------------------- 4:44 AM on 03/12/2019 -----------------------------------------  Awaiting MRI result.  Urinalysis is consistent with UTI.  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  I will start on Augmentin and Macrobid to ensure coverage.  If MRI negative I think she can follow-up outpatient.   ----------------------------------------- 5:38 AM on  03/12/2019 -----------------------------------------  MRI negative, no acute changes for at least the past 4 years.  Work-up confirms symptoms are due to a urinary tract infection.  Patient is not septic, no evidence of pyelonephritis.  Considering prior culture results I will start on Augmentin and Macrobid and have her follow-up closely with primary care.  Does not appear to require hospitalization at this time.  ____________________________________________   FINAL CLINICAL IMPRESSION(S) / ED DIAGNOSES    Final diagnoses:  Cystitis  Fatigue, unspecified type     ED Discharge Orders         Ordered    amoxicillin-clavulanate (AUGMENTIN) 875-125 MG tablet  2 times daily     03/12/19 0537    nitrofurantoin (MACRODANTIN) 100 MG capsule  2 times daily     03/12/19 0537    ondansetron (ZOFRAN ODT) 4 MG disintegrating tablet  Every 8 hours PRN     03/12/19 0537          Portions of this note were generated with dragon dictation software. Dictation errors may occur despite best attempts at proofreading.   Carrie Mew, MD 03/12/19 [id number]

## 2019-03-12 NOTE — ED Notes (Addendum)
Patient transported to MRI 

## 2019-03-13 ENCOUNTER — Other Ambulatory Visit: Payer: Self-pay

## 2019-03-13 ENCOUNTER — Other Ambulatory Visit: Payer: Medicaid Other | Admitting: Primary Care

## 2019-03-13 DIAGNOSIS — Z515 Encounter for palliative care: Secondary | ICD-10-CM

## 2019-03-13 LAB — URINE CULTURE: Culture: 80000 — AB

## 2019-03-13 NOTE — Progress Notes (Signed)
[facility] Consult Note Telephone: [phone]  Fax: [phone]   PATIENT NAME: Destiny [name] DOB: [birth year] MRN: [id number]  PRIMARY CARE PROVIDER:   [name], MD, [place] [address] [place] [address] [phone]  REFERRING PROVIDER:  [name], MD [address] [place],  [place] [address] [phone]  RESPONSIBLE PARTY:   Extended Emergency Contact Information Primary Emergency Contact: Joia, Tatis Address: [address]          [place], [place] [address] Johnnette Litter of [place] Phone: [phone] Relation: Spouse Secondary Emergency Contact: Colin Rhein [place] of [place] Mobile Phone: [phone] Relation: Son   ASSESSMENT AND RECOMMENDATIONS:   1. Advance Care Planning/Goals of Care: Goals include to maximize quality of life and symptom management. Five wishes brochures given to family. They do not have formal poa done. I asked them to discuss with their children. Pt husband states he needs one too in case something happens to him, to say who'd care for patient.   2. Symptom Management:   Mobility: Please make home health referral for PT and OT, has had [facility] before. Has had several ED trips and increased weakness, and recent fall. She can walk with walker, bathroom and in [address] several days ago She remembers falling, said she didn't take her walker. Needs fall prevention teaching, instruction for stand by assist to ambulate.  Dementia: Is confused at to days, time. Can use cell phone to answer. She tried to get up but was not strong enough, and fell.    UTI: Compliant with meds, drinking water. Gets diflucan with antibiotics, husband also reports skin candidiasis. Chemo is on hold until after antibiotics, per oncology. I could not locate sensitivity in chart.Patient  is taking augmentin and nitrofurantoin x 7 days for this UTI.  Constipation:  Endorses constipation, I recommend to give miralax twice a day, is taking colace; I also recommended senna titrate to effect. Single full dose of miralax precipitates explosive diarrhea, half dose leaves her constipated. I advised a half dose bid. We also discussed use of anti emetics and how they and narcotics are constipating.  Skin: Discussed ongoing yeast infection. I suggested interdry which will have to be ordered from a medical supply. Interdry must be paid for OTC. I suggested cream anti fungal (OTC) instead of powder due to chafing.  3. Family /Caregiver/Community Supports: Lives with hunt band in senior housing apartment has personal care with [employer] cur[employer]. Husband thought this was home health and that therapy could not be in until aide ws done. This is a medicaid reimbursed service and pt could be seen under medicare for therapies.  4. 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 Difficulty in ambulation, has had falls.  5. Follow up Palliative Care Visit: Palliative care will continue to follow for goals of care clarification and symptom management. Return 4 weeks or prn.  I spent 60 minutes providing this consultation,  from 1030 to 1130 . More than 50% of the time in this consultation was spent coordinating communication.   HISTORY OF PRESENT ILLNESS:  Destiny [name] is a 63 y.o. year old female with multiple medical problems including CAD, CHF, CVA, Ovarian cancer, hyponatremia. Palliative Care was asked to follow this patient by consultation request of [name], MD to help address advance care planning and goals of care. This is a follow  up visit.  CODE STATUS: TBD, Five wishes book  left.   PPS: 30% HOSPICE ELIGIBILITY/DIAGNOSIS: TBD  PAST MEDICAL HISTORY:  Past Medical History:  Diagnosis Date  . CAD (coronary artery disease)   . CHF (congestive heart failure) ([place])   . CVA (cerebral  infarction)   . Female bladder prolapse   . GERD (gastroesophageal reflux disease)   . Hyperlipidemia   . Hypertension   . Hypokalemia   . Mitral valve disease   . Ovarian ca ([place])   . Sleep apnea   . Stroke [facility])    x3    SOCIAL HX:  Social History   Tobacco Use  . Smoking status: Never Smoker  . Smokeless tobacco: Never Used  Substance Use Topics  . Alcohol use: No    Alcohol/week: 0.0 standard drinks    ALLERGIES:  Allergies  Allergen Reactions  . Atorvastatin Hives    No s/sx of anaphylaxis, just intermittent hives.  Does not seem to be a class effect as she has tolerated lovastatin previously No s/sx of anaphylaxis, just intermittent hives.  Does not seem to be a class effect as she has tolerated lovastatin previously  . Fluconazole Rash    Reaction to generic but not name brand Reaction to generic but not name brand CAN USE DIFLUCAN.DOES NOT BREAK OUT WITH DIFLUCAN.(allergic only to generic brand)  . Sertraline Itching  . Sulfa Antibiotics Rash and Other (See Comments)    "hot " sensation  . Sulfamethoxazole-Trimethoprim Rash and Other (See Comments)    "felt hot" Other reaction(s): Other (See Comments) "felt hot"  . Sulfasalazine Other (See Comments) and Rash    "hot " sensation     PERTINENT MEDICATIONS:  Outpatient Encounter Medications as of 03/13/2019  Medication Sig  . acetaminophen (TYLENOL) 325 MG tablet Take 1-2 tablets (325-650 mg total) by mouth every 4 (four) hours as needed for mild pain.  Marland Kitchen albuterol (VENTOLIN HFA) 108 (90 Base) MCG/ACT inhaler Inhale 2 puffs into the lungs every 4 (four) hours as needed for wheezing or shortness of breath.  Marland Kitchen amoxicillin-clavulanate (AUGMENTIN) 875-125 MG tablet Take 1 tablet by mouth 2 (two) times daily. (Patient taking differently: Take 1 tablet by mouth 2 (two) times daily. X 7 day)  . aspirin 81 MG chewable tablet Chew 81 mg by mouth daily.  . Cholecalciferol (VITAMIN D3) 125 MCG (5000 UT) CAPS Take 1  capsule (5,000 Units total) by mouth daily.  . clopidogrel (PLAVIX) 75 MG tablet Take 1 tablet (75 mg total) by mouth daily.  Mariane Baumgarten Calcium (STOOL SOFTENER PO) Take 100 mg by mouth daily.   . famotidine (PEPCID) 20 MG tablet Take 1 tablet (20 mg total) by mouth 2 (two) times daily.  Marland Kitchen lidocaine-prilocaine (EMLA) cream Apply to affected area once  . Melatonin 3 MG TABS Take 3 mg by mouth at bedtime.  . mupirocin ointment (BACTROBAN) 2 % Apply topically daily.  . nitrofurantoin (MACRODANTIN) 100 MG capsule Take 1 capsule (100 mg total) by mouth 2 (two) times daily. (Patient taking differently: Take 100 mg by mouth 2 (two) times daily. X 7 days)  . nitroGLYCERIN (NITROSTAT) 0.4 MG SL tablet Place 0.4 mg under the tongue every 5 (five) minutes as needed for chest pain.  Marland Kitchen ondansetron (ZOFRAN ODT) 4 MG disintegrating tablet Take 1 tablet (4 mg total) by mouth every 8 (eight) hours as needed for nausea or vomiting.  . ondansetron (ZOFRAN) 4 MG tablet Take 4 mg by mouth every 8 (eight)  hours as needed for nausea or vomiting.  Marland Kitchen oxybutynin (DITROPAN) 5 MG tablet Take 1 tablet (5 mg total) by mouth 2 (two) times daily.  Marland Kitchen oxyCODONE (OXY IR/ROXICODONE) 5 MG immediate release tablet Take 1-2 tablets (5-10 mg total) by mouth every 6 (six) hours as needed for severe pain.  Marland Kitchen PARoxetine (PAXIL) 30 MG tablet Take 1 tablet (30 mg total) by mouth daily.  . pregabalin (LYRICA) 150 MG capsule Take 1 capsule (150 mg total) by mouth at bedtime.  . pregabalin (LYRICA) 75 MG capsule Take 1 capsule (75 mg total) by mouth 2 (two) times daily.  . prochlorperazine (COMPAZINE) 10 MG tablet Take 1 tablet (10 mg total) by mouth every 6 (six) hours as needed (Nausea or vomiting).  . rosuvastatin (CRESTOR) 20 MG tablet Take 1 tablet (20 mg total) by mouth at bedtime.  Marland Kitchen spironolactone (ALDACTONE) 25 MG tablet Take 0.5 tablets (12.5 mg total) by mouth daily.  Marland Kitchen venlafaxine XR (EFFEXOR-XR) 75 MG 24 hr capsule Take 75 mg by  mouth daily with breakfast.  . clonazePAM (KLONOPIN) 0.5 MG tablet Take 1 tablet (0.5 mg total) by mouth at bedtime as needed for anxiety. (Patient not taking: Reported on 03/13/2019)  . mometasone-formoterol (DULERA) 100-5 MCG/ACT AERO Inhale 2 puffs into the lungs 2 times daily at 12 noon and 4 pm.   No facility-administered encounter medications on file as of 03/13/2019.      PHYSICAL EXAM / ROS:   Current and past weights: 188 lb, normally she was 230 lbs.  General: NAD, frail appearing, obese HEENT: Has hearing aids but is very HOH.  Cardiovascular: no chest pain reported, no edema Pulmonary: no cough, no increased SOB Abdomen: appetite  50%, endorses constipation,  continent of bowel GU: denies dysuria, occ  incontinent of urine MSK:  no joint deformities, ambulatory Skin: no rashes or wounds reported Neurological: Weakness, left hemiparesis, forgetful, HOH  Jason Coop, NP  COVID-19 PATIENT SCREENING TOOL  Person answering questions: __________Danny_________ _____   1.  Is the patient or any family member in the home showing any signs or symptoms regarding respiratory infection?               Person with Symptom- ______NA_____________________  a. Fever                                                                          Yes___ No___          ___________________  b. Shortness of breath                                                    Yes___ No___          ___________________ c. Cough/congestion                                       Yes___  No___         ___________________ d. Body aches/pains  Yes___ No___        ____________________ e. Gastrointestinal symptoms (diarrhea, nausea)           Yes___ No___        ____________________  2. Within the past 14 days, has anyone living in the home had any contact with someone with or under investigation for COVID-19?    Yes___ No_x  _   Person __________________

## 2019-03-14 ENCOUNTER — Inpatient Hospital Stay: Payer: Medicaid Other | Admitting: Oncology

## 2019-03-14 ENCOUNTER — Inpatient Hospital Stay: Payer: Medicaid Other

## 2019-03-14 NOTE — Progress Notes (Signed)
ED Antimicrobial Stewardship Positive Culture Follow Up   KAMY ANTRIM is an 63 y.o. female who presented to [facility] on 03/11/2019 with a chief complaint of No chief complaint on file.   Recent Results (from the past 720 hour(s))  Urine culture     Status: Abnormal   Collection Time: 03/05/19  3:08 PM   Specimen: Urine, Catheterized  Result Value Ref Range Status   Specimen Description   Final    URINE, CATHETERIZED Performed at [facility], [address], [place], [place] [address]    Special Requests   Final    NONE Performed at [facility], [place] [place],  [address]    Culture >=100,000 COLONIES/mL LACTOBACILLUS SPECIES (A)  Final   Report Status 03/07/2019 FINAL  Final  SARS Coronavirus 2 [facility] order, Performed in [facility] hospital lab) Nasopharyngeal Nasopharyngeal Swab     Status: None   Collection Time: 03/05/19  4:35 PM   Specimen: Nasopharyngeal Swab  Result Value Ref Range Status   SARS Coronavirus 2 NEGATIVE NEGATIVE Final    Comment: (NOTE) If result is NEGATIVE SARS-CoV-2 target nucleic acids are NOT DETECTED. The SARS-CoV-2 RNA is generally detectable in upper and lower  respiratory specimens during the acute phase of infection. The lowest  concentration of SARS-CoV-2 viral copies this assay can detect is 250  copies / mL. A negative result does not preclude SARS-CoV-2 infection  and should not be used as the sole basis for treatment or other  patient management decisions.  A negative result may occur with  improper specimen collection / handling, submission of specimen other  than nasopharyngeal swab, presence of viral mutation(s) within the  areas targeted by this assay, and inadequate number of viral copies  (<250 copies / mL). A negative result must be combined with clinical  observations, patient history, and epidemiological information. If result is POSITIVE SARS-CoV-2 target nucleic acids are  DETECTED. The SARS-CoV-2 RNA is generally detectable in upper and lower  respiratory specimens dur ing the acute phase of infection.  Positive  results are indicative of active infection with SARS-CoV-2.  Clinical  correlation with patient history and other diagnostic information is  necessary to determine patient infection status.  Positive results do  not rule out bacterial infection or co-infection with other viruses. If result is PRESUMPTIVE POSTIVE SARS-CoV-2 nucleic acids MAY BE PRESENT.   A presumptive positive result was obtained on the submitted specimen  and confirmed on repeat testing.  While 2019 novel coronavirus  (SARS-CoV-2) nucleic acids may be present in the submitted sample  additional confirmatory testing may be necessary for epidemiological  and / or clinical management purposes  to differentiate between  SARS-CoV-2 and other Sarbecovirus currently known to infect humans.  If clinically indicated additional testing with an alternate test  methodology [phone]) is advised. The SARS-CoV-2 RNA is generally  detectable in upper and lower respiratory sp ecimens during the acute  phase of infection. The expected result is Negative. Fact Sheet for Patients:  StrictlyIdeas.no Fact Sheet for Healthcare Providers: BankingDealers.co.za This test is not yet approved or cleared by the [place] FDA and has been authorized for detection and/or diagnosis of SARS-CoV-2 by FDA under an Emergency Use Authorization (EUA).  This EUA will remain in effect (meaning this test can be used) for the duration of the COVID-19 declaration under Section 564(b)(1) of the Act, 21 U.S.C. section 360bbb-3(b)(1), unless the authorization is terminated or revoked sooner. Performed at [facility], [place], [place] [address]   SARS CORONAVIRUS 2 (TAT 6-12 HRS) Nasal Swab Aptima Multi Swab     Status: None   Collection  Time: 03/12/19 12:31 AM   Specimen: Aptima Multi Swab; Nasal Swab  Result Value Ref Range Status   SARS Coronavirus 2 NEGATIVE NEGATIVE Final    Comment: (NOTE) SARS-CoV-2 target nucleic acids are NOT DETECTED. The SARS-CoV-2 RNA is generally detectable in upper and lower respiratory specimens during the acute phase of infection. Negative results do not preclude SARS-CoV-2 infection, do not rule out co-infections with other pathogens, and should not be used as the sole basis for treatment or other patient management decisions. Negative results must be combined with clinical observations, patient history, and epidemiological information. The expected result is Negative. Fact Sheet for Patients: SugarRoll.be Fact Sheet for Healthcare Providers: https://www.woods-mathews.com/ This test is not yet approved or cleared by the [place] FDA and  has been authorized for detection and/or diagnosis of SARS-CoV-2 by FDA under an Emergency Use Authorization (EUA). This EUA will remain  in effect (meaning this test can be used) for the duration of the COVID-19 declaration under Section 56 4(b)(1) of the Act, 21 U.S.C. section 360bbb-3(b)(1), unless the authorization is terminated or revoked sooner. Performed at [facility], [place] [address], [place], [place] [address]   Urine Culture     Status: Abnormal   Collection Time: 03/12/19 12:31 AM   Specimen: Urine, Random  Result Value Ref Range Status   Specimen Description   Final    URINE, RANDOM Performed at [facility], [address], [place], [place] [address]    Special Requests   Final    NONE Performed at [facility], [address] [place], [place] [address]    Culture 80,000 COLONIES/mL YEAST (A)  Final   Report Status 03/13/2019 FINAL  Final    Plan: Patient discharged on 2 antibiotics for UTI(Augmentin and Macrobid). Ucx resulted with 80,000 colonies of  yeast. Spoke to patient's husband regarding how patient has been feeling. He stated that she feels much better and doesn't currently have any signs or symptoms of a UTI. Patient also took Diflucan, which may have helped cure probable yeast infection. Informed patient's husband that it is not necessary for wife to finish 7 days of therapy and can discontinue antibiotics. Husband stated she may continue them "just in case", but will let her wife know of the information given.  ED Provider: N/A   Pearla Dubonnet ,[facility] Clinical Pharmacist  03/14/2019, 2:40 PM

## 2019-03-15 ENCOUNTER — Telehealth: Payer: Self-pay | Admitting: Primary Care

## 2019-03-15 NOTE — Telephone Encounter (Signed)
T/c from husband re uncontrolled right shoulder pain. States PCP requests oncology and palliative to manage. I will f/u with oncology to make a plan.

## 2019-03-15 NOTE — Telephone Encounter (Signed)
Ms.Gilchrest's husband called last evening reporting uncontrolled pain. I returned call this a.m. to discuss. He states he gives her oxcodone 5 mg pills every four hours with no relief. I had assessed pain on my visit this week and it was mentioned but not to the degree of this current report. I requested that he give her pain medicine through the weekend every four hours as needed as prescribed and record before and after pain score and how many doses. I made an appointment to see them on Monday Aug 31. I will assess pain needs and safety for home pain management. Husband stated that PCP requested oncology and palliative manage pain control. I phoned to cooperate but was not able to leave a message in the [facility] phone mail system. I will fax this note for Dr. Serita Grit information and corroboration.

## 2019-03-18 ENCOUNTER — Other Ambulatory Visit: Payer: Medicaid Other | Admitting: Primary Care

## 2019-03-18 ENCOUNTER — Telehealth: Payer: Self-pay | Admitting: Primary Care

## 2019-03-18 ENCOUNTER — Other Ambulatory Visit: Payer: Self-pay

## 2019-03-18 MED ORDER — GENERIC EXTERNAL MEDICATION
30.00 | Status: DC
Start: ? — End: 2019-03-18

## 2019-03-18 MED ORDER — HYDRALAZINE HCL 20 MG/ML IJ SOLN
10.00 | INTRAMUSCULAR | Status: DC
Start: ? — End: 2019-03-18

## 2019-03-18 MED ORDER — GENERIC EXTERNAL MEDICATION
2.00 | Status: DC
Start: ? — End: 2019-03-18

## 2019-03-18 MED ORDER — ENOXAPARIN SODIUM 40 MG/0.4ML ~~LOC~~ SOLN
40.00 | SUBCUTANEOUS | Status: DC
Start: 2019-03-18 — End: 2019-03-18

## 2019-03-18 MED ORDER — LUMBOSACRAL SUPPORT PETITE M MISC
75.00 | Status: DC
Start: 2019-03-18 — End: 2019-03-18

## 2019-03-18 MED ORDER — POTASSIUM CHLORIDE 20 MEQ PO PACK
40.00 | PACK | ORAL | Status: DC
Start: ? — End: 2019-03-18

## 2019-03-18 MED ORDER — SPIRONOLACTONE 25 MG PO TABS
12.50 | ORAL_TABLET | ORAL | Status: DC
Start: 2019-03-19 — End: 2019-03-18

## 2019-03-18 MED ORDER — LABETALOL HCL 5 MG/ML IV SOLN
10.00 | INTRAVENOUS | Status: DC
Start: ? — End: 2019-03-18

## 2019-03-18 MED ORDER — POLYETHYLENE GLYCOL 3350 17 G PO PACK
17.00 | PACK | ORAL | Status: DC
Start: 2019-03-18 — End: 2019-03-18

## 2019-03-18 MED ORDER — MAGNESIUM OXIDE 400 MG PO TABS
800.00 | ORAL_TABLET | ORAL | Status: DC
Start: ? — End: 2019-03-18

## 2019-03-18 MED ORDER — OXYCODONE HCL 5 MG PO TABS
5.00 | ORAL_TABLET | ORAL | Status: DC
Start: ? — End: 2019-03-18

## 2019-03-18 MED ORDER — CHLORHEXIDINE GLUCONATE 0.12 % MT SOLN
5.00 | OROMUCOSAL | Status: DC
Start: 2019-03-18 — End: 2019-03-18

## 2019-03-18 MED ORDER — METOPROLOL TARTRATE 25 MG PO TABS
25.00 | ORAL_TABLET | ORAL | Status: DC
Start: 2019-03-18 — End: 2019-03-18

## 2019-03-18 MED ORDER — INSULIN REGULAR HUMAN 100 UNIT/ML IJ SOLN
0.00 | INTRAMUSCULAR | Status: DC
Start: 2019-03-18 — End: 2019-03-18

## 2019-03-18 MED ORDER — MAGNESIUM SULFATE 2 GM/50ML IV SOLN
2.00 | INTRAVENOUS | Status: DC
Start: ? — End: 2019-03-18

## 2019-03-18 MED ORDER — ACETAMINOPHEN 325 MG PO TABS
650.00 | ORAL_TABLET | ORAL | Status: DC
Start: ? — End: 2019-03-18

## 2019-03-18 MED ORDER — CALCIUM-VITAMIN D-VITAMIN K 600-1000-90 MG-UNT-MCG PO TABS
25.00 | ORAL_TABLET | ORAL | Status: DC
Start: ? — End: 2019-03-18

## 2019-03-18 MED ORDER — ATORVASTATIN CALCIUM 40 MG PO TABS
40.00 | ORAL_TABLET | ORAL | Status: DC
Start: 2019-03-18 — End: 2019-03-18

## 2019-03-18 MED ORDER — POTASSIUM CHLORIDE 20 MEQ PO PACK
20.00 | PACK | ORAL | Status: DC
Start: ? — End: 2019-03-18

## 2019-03-18 MED ORDER — TAMSULOSIN HCL 0.4 MG PO CAPS
0.40 | ORAL_CAPSULE | ORAL | Status: DC
Start: 2019-03-18 — End: 2019-03-18

## 2019-03-18 MED ORDER — DM-GG-POT CITRATE-CITRIC ACID PO
1000.00 | ORAL | Status: DC
Start: 2019-03-18 — End: 2019-03-18

## 2019-03-18 MED ORDER — ONDANSETRON HCL 4 MG/2ML IJ SOLN
4.00 | INTRAMUSCULAR | Status: DC
Start: ? — End: 2019-03-18

## 2019-03-18 MED ORDER — DEXTROSE 50 % IV SOLN
12.50 | INTRAVENOUS | Status: DC
Start: ? — End: 2019-03-18

## 2019-03-18 MED ORDER — POTASSIUM CHLORIDE 20 MEQ PO PACK
60.00 | PACK | ORAL | Status: DC
Start: ? — End: 2019-03-18

## 2019-03-18 MED ORDER — FAMOTIDINE 20 MG PO TABS
20.00 | ORAL_TABLET | ORAL | Status: DC
Start: 2019-03-18 — End: 2019-03-18

## 2019-03-18 NOTE — Telephone Encounter (Signed)
Call from [facility] patient condition and palliative care at home. Update given.

## 2019-03-20 ENCOUNTER — Inpatient Hospital Stay: Payer: Medicaid Other | Admitting: Oncology

## 2019-03-20 ENCOUNTER — Inpatient Hospital Stay: Payer: Medicaid Other

## 2019-03-22 ENCOUNTER — Telehealth: Payer: Self-pay | Admitting: Primary Care

## 2019-03-22 NOTE — Telephone Encounter (Signed)
I called Mr. Eckenrode today to check on how Destiny [name] was doing at [employer]. He said that her situation is grave and that he and his son will be going over to remove her from the vent to later for an expected death tomorrow. He said that their anniversary was yesterday, 38 years. He said she was tired of fighting and had said for the last two weeks that she hoped she would just lay down and fall asleep. He felt that this would be her wish and was at peace with the decision. He said only his son and he could be there but the other family members wanted to remember her as she had been in health and so felt all the family was as good as could be expected. I offered my assistance if I could help and will be available should he need anything for questions or concerns.

## 2019-03-26 ENCOUNTER — Telehealth: Payer: Self-pay | Admitting: *Deleted

## 2019-03-26 NOTE — Telephone Encounter (Signed)
Husband called to report that patient expired Saturday

## 2019-04-01 ENCOUNTER — Telehealth: Payer: Self-pay

## 2019-04-01 NOTE — Telephone Encounter (Signed)
[facility] CDx has resulted 03/27/19. Noted that Ms Platas passed away 2019-04-05. Report sent to HIM to be scanned to record.

## 2019-04-18 ENCOUNTER — Encounter: Payer: Self-pay | Admitting: Oncology

## 2019-04-18 DEATH — deceased

## 2019-05-08 ENCOUNTER — Ambulatory Visit: Payer: Medicaid Other

## 2019-07-18 ENCOUNTER — Encounter: Payer: Medicaid Other | Admitting: Licensed Clinical Social Worker

## 2020-05-11 IMAGING — CT CT HEAD WITHOUT CONTRAST
3 of 4 series · 14 of 47 positions shown, 16 images · non-contrast
Comparison: 11/13/2017

CLINICAL DATA: Increased somnolence

EXAM:
CT HEAD WITHOUT CONTRAST
TECHNIQUE: Contiguous axial images were obtained from the base of the skull
through the vertex without intravenous contrast.

[Series 3: ax head wo · axial · 0.36mm/px · z∈[-134,-4]mm · 8 of 32 slices shown, 10 images]
[im 4/32  brain]
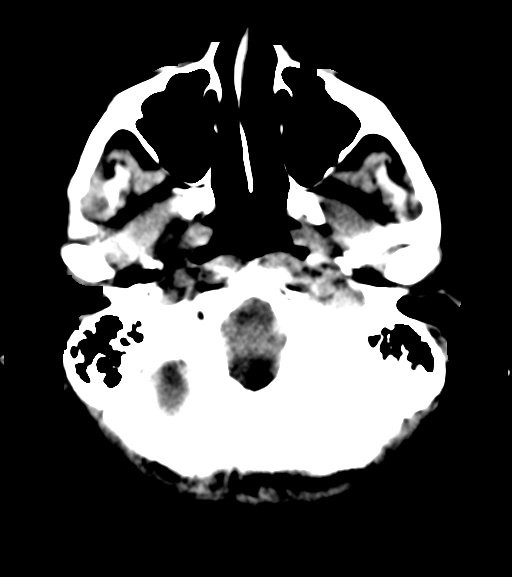
[im 4/32  bone]
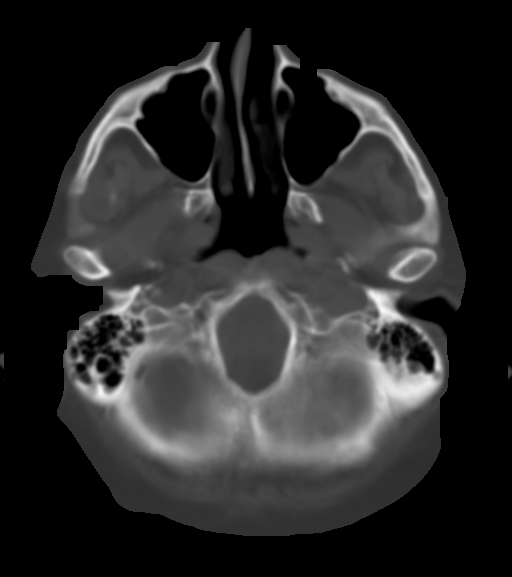
[im 8/32  brain]
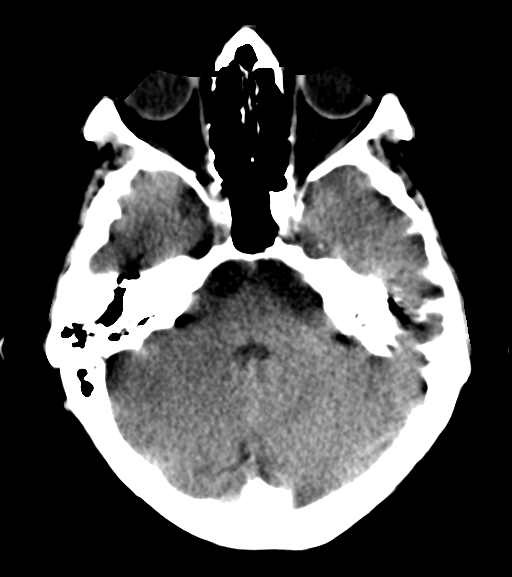
[im 11/32  brain]
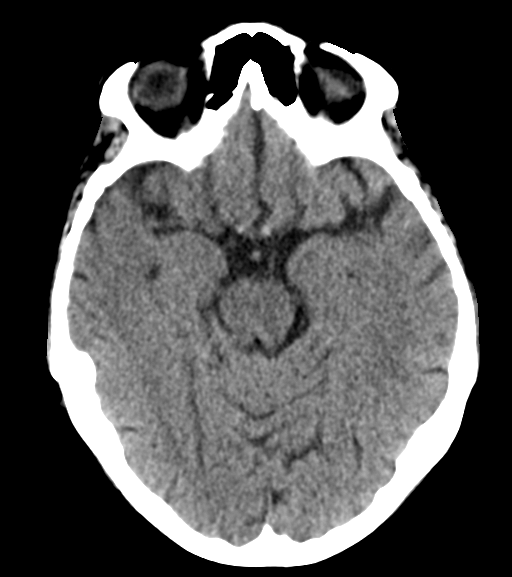
[im 15/32  brain]
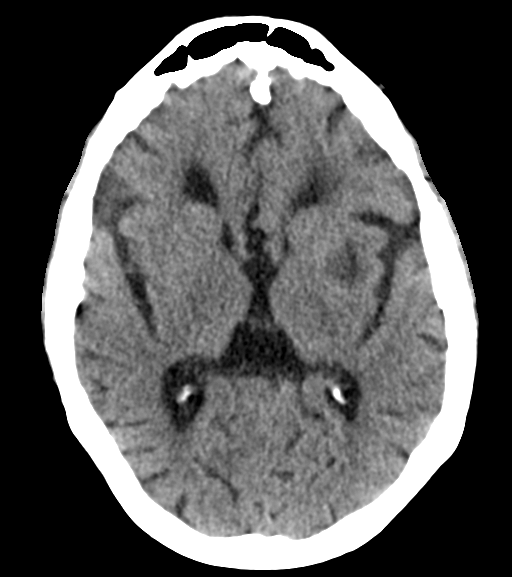
[im 19/32  brain]
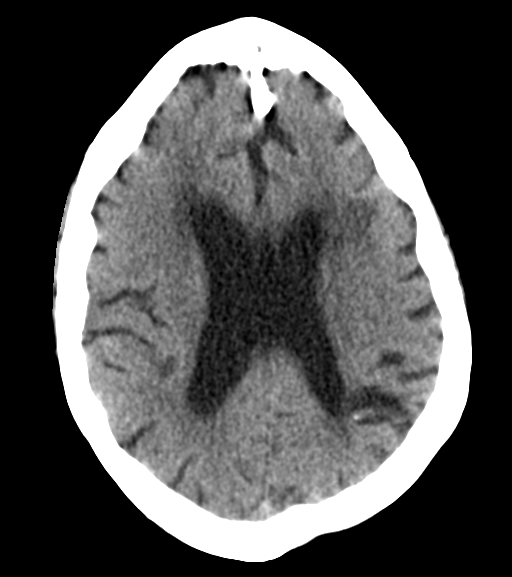
[im 19/32  bone]
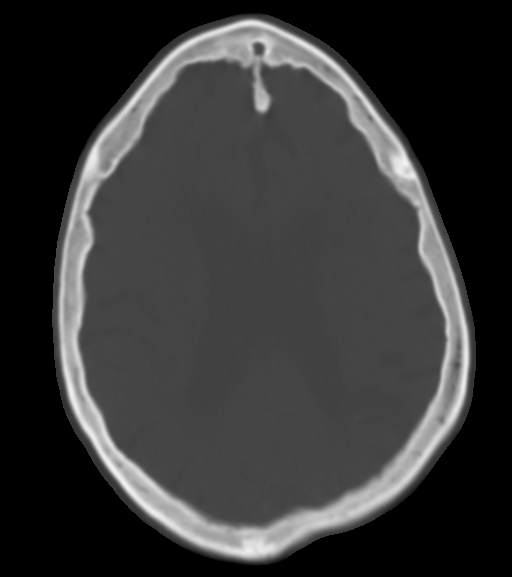
[im 22/32  brain]
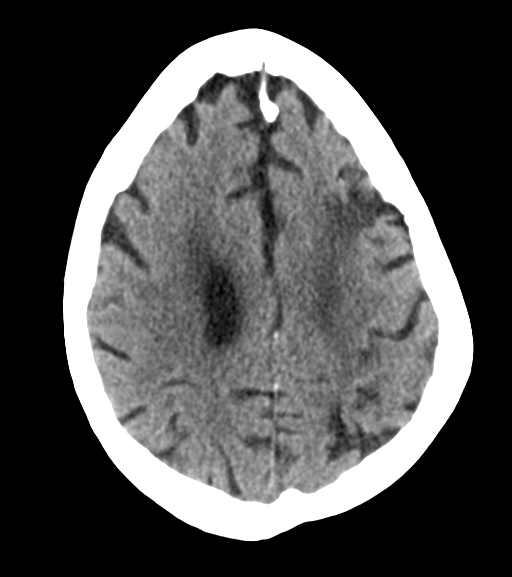
[im 26/32  brain]
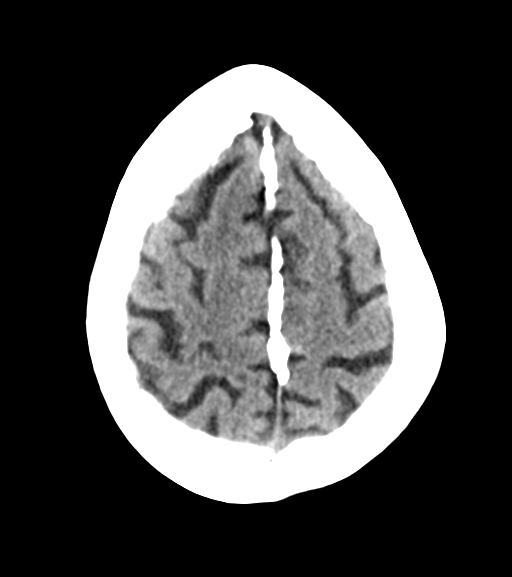
[im 30/32  brain]
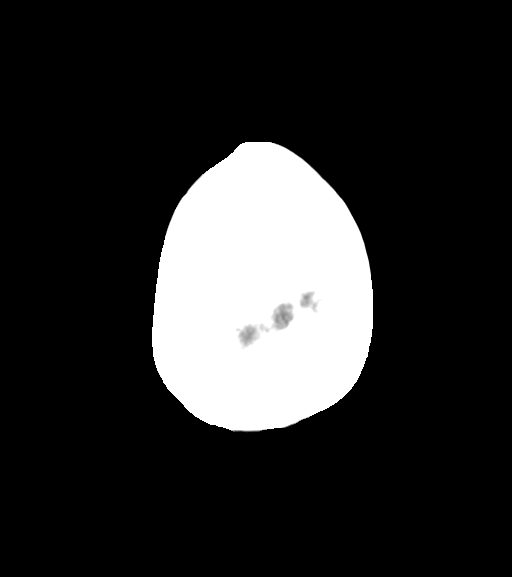

[Series 5: coronal soft tissue · coronal · 0.31mm/px · 3 of 69 slices shown]
[im 23/69  brain]
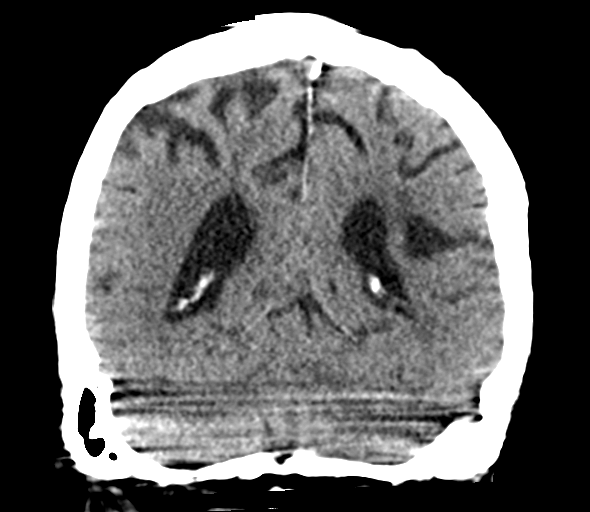
[im 31/69  brain]
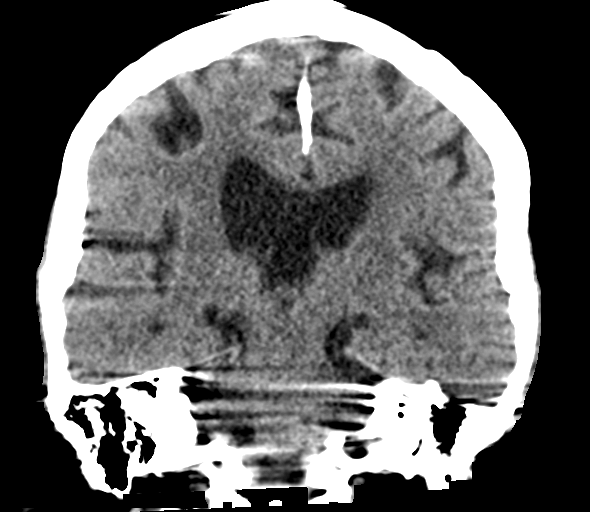
[im 38/69  brain]
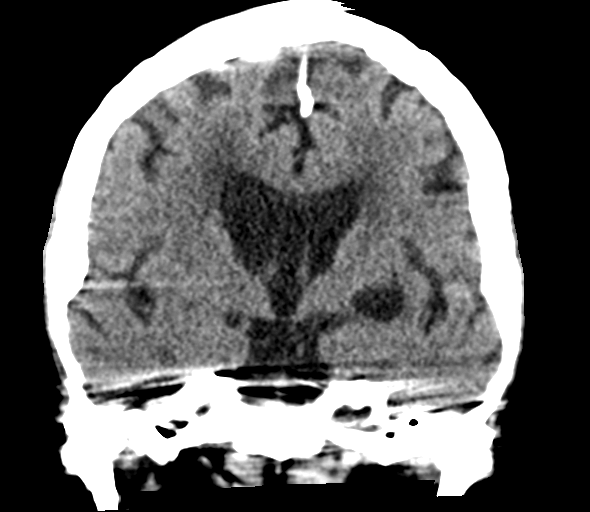

[Series 6: sagittal soft tissue · sagittal · 0.31mm/px · 3 of 57 slices shown]
[im 19/57  brain]
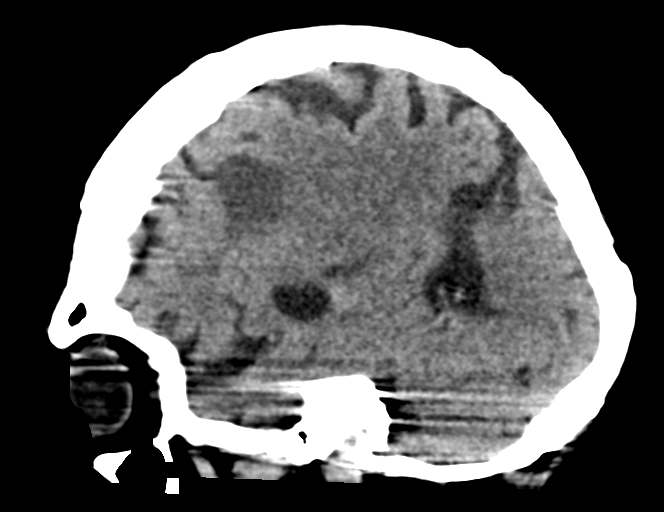
[im 29/57  brain]
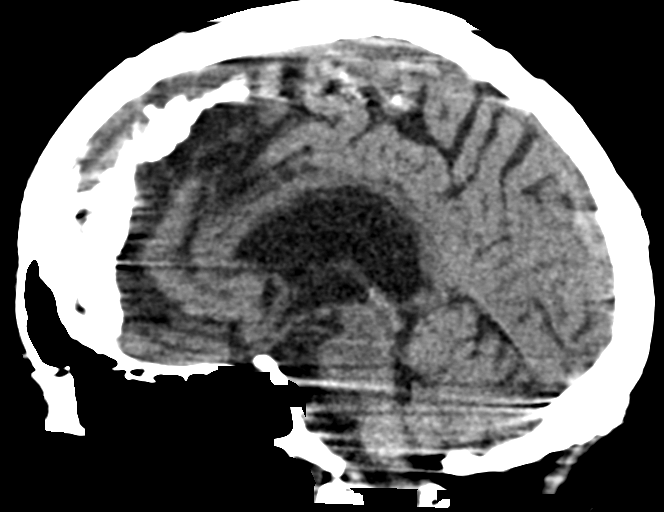
[im 38/57  brain]
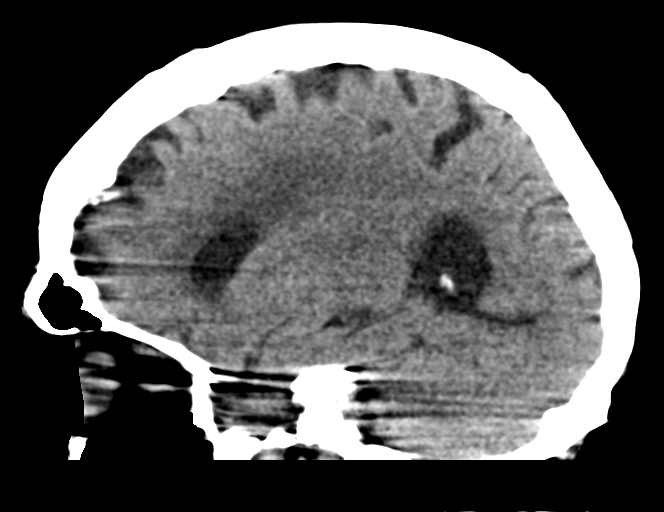

[14 of 47 positions shown; findings below may reference images not displayed]

FINDINGS: Brain: Mild atrophic changes are noted. Stable chronic white matter
ischemic changes are seen. Areas of prior frontal and parietal
infarcts on the left are noted and stable. Dilated perivascular
space in the left lentiform nucleus is again seen and stable. No
findings to suggest acute hemorrhage, acute infarction or
space-occupying mass lesion are noted. Cavum septum pellucidum is
again seen.

Vascular: No hyperdense vessel or unexpected calcification.

Skull: Normal. Negative for fracture or focal lesion.

Sinuses/Orbits: No acute finding.

Other: None.
IMPRESSION: Chronic changes without acute abnormality.
